# Patient Record
Sex: Female | Born: 1970 | Race: Black or African American | Hispanic: No | Marital: Married | Smoking: Former smoker
Health system: Southern US, Community
[De-identification: ages and names within clinical notes are randomized; demographics above are authoritative.]

## PROBLEM LIST (undated history)

## (undated) DIAGNOSIS — D649 Anemia, unspecified: Secondary | ICD-10-CM

## (undated) DIAGNOSIS — R51 Headache: Secondary | ICD-10-CM

## (undated) DIAGNOSIS — F419 Anxiety disorder, unspecified: Secondary | ICD-10-CM

## (undated) DIAGNOSIS — I509 Heart failure, unspecified: Secondary | ICD-10-CM

## (undated) DIAGNOSIS — M1990 Unspecified osteoarthritis, unspecified site: Secondary | ICD-10-CM

## (undated) DIAGNOSIS — F329 Major depressive disorder, single episode, unspecified: Secondary | ICD-10-CM

## (undated) DIAGNOSIS — F32A Depression, unspecified: Secondary | ICD-10-CM

## (undated) DIAGNOSIS — I10 Essential (primary) hypertension: Secondary | ICD-10-CM

## (undated) DIAGNOSIS — E119 Type 2 diabetes mellitus without complications: Secondary | ICD-10-CM

## (undated) DIAGNOSIS — Z87898 Personal history of other specified conditions: Secondary | ICD-10-CM

## (undated) HISTORY — PX: TONSILLECTOMY: SUR1361

## (undated) HISTORY — PX: INTRAUTERINE DEVICE INSERTION: SHX323

## (undated) HISTORY — DX: Type 2 diabetes mellitus without complications: E11.9

## (undated) HISTORY — PX: TUBAL LIGATION: SHX77

## (undated) HISTORY — DX: Essential (primary) hypertension: I10

## (undated) HISTORY — PX: SPINE SURGERY: SHX786

## (undated) HISTORY — PX: WISDOM TOOTH EXTRACTION: SHX21

---

## 1998-01-25 ENCOUNTER — Emergency Department (HOSPITAL_COMMUNITY): Admission: EM | Admit: 1998-01-25 | Discharge: 1998-01-25 | Payer: Self-pay | Admitting: Emergency Medicine

## 2000-04-30 ENCOUNTER — Emergency Department (HOSPITAL_COMMUNITY): Admission: EM | Admit: 2000-04-30 | Discharge: 2000-04-30 | Payer: Self-pay | Admitting: *Deleted

## 2003-12-05 ENCOUNTER — Emergency Department (HOSPITAL_COMMUNITY): Admission: EM | Admit: 2003-12-05 | Discharge: 2003-12-05 | Payer: Self-pay | Admitting: Family Medicine

## 2004-09-25 ENCOUNTER — Emergency Department (HOSPITAL_COMMUNITY): Admission: EM | Admit: 2004-09-25 | Discharge: 2004-09-25 | Payer: Self-pay | Admitting: Emergency Medicine

## 2005-03-12 ENCOUNTER — Emergency Department (HOSPITAL_COMMUNITY): Admission: EM | Admit: 2005-03-12 | Discharge: 2005-03-12 | Payer: Self-pay | Admitting: Family Medicine

## 2005-03-30 ENCOUNTER — Emergency Department (HOSPITAL_COMMUNITY): Admission: EM | Admit: 2005-03-30 | Discharge: 2005-03-30 | Payer: Self-pay | Admitting: Family Medicine

## 2006-01-22 ENCOUNTER — Emergency Department (HOSPITAL_COMMUNITY): Admission: EM | Admit: 2006-01-22 | Discharge: 2006-01-22 | Payer: Self-pay | Admitting: *Deleted

## 2007-08-08 ENCOUNTER — Emergency Department (HOSPITAL_COMMUNITY): Admission: EM | Admit: 2007-08-08 | Discharge: 2007-08-08 | Payer: Self-pay | Admitting: Emergency Medicine

## 2007-11-11 ENCOUNTER — Emergency Department (HOSPITAL_COMMUNITY): Admission: EM | Admit: 2007-11-11 | Discharge: 2007-11-11 | Payer: Self-pay | Admitting: Emergency Medicine

## 2008-03-24 ENCOUNTER — Emergency Department (HOSPITAL_COMMUNITY): Admission: EM | Admit: 2008-03-24 | Discharge: 2008-03-24 | Payer: Self-pay | Admitting: Emergency Medicine

## 2008-05-24 ENCOUNTER — Ambulatory Visit: Payer: Self-pay | Admitting: Family Medicine

## 2008-05-24 DIAGNOSIS — I10 Essential (primary) hypertension: Secondary | ICD-10-CM | POA: Insufficient documentation

## 2008-05-24 HISTORY — DX: Morbid (severe) obesity due to excess calories: E66.01

## 2008-05-24 LAB — CONVERTED CEMR LAB: TSH: 1.021 microintl units/mL (ref 0.350–4.50)

## 2008-05-25 ENCOUNTER — Ambulatory Visit: Payer: Self-pay | Admitting: *Deleted

## 2008-06-01 ENCOUNTER — Telehealth (INDEPENDENT_AMBULATORY_CARE_PROVIDER_SITE_OTHER): Payer: Self-pay | Admitting: *Deleted

## 2008-06-06 DIAGNOSIS — J45909 Unspecified asthma, uncomplicated: Secondary | ICD-10-CM | POA: Insufficient documentation

## 2008-08-19 ENCOUNTER — Telehealth (INDEPENDENT_AMBULATORY_CARE_PROVIDER_SITE_OTHER): Payer: Self-pay | Admitting: *Deleted

## 2008-08-24 ENCOUNTER — Ambulatory Visit: Payer: Self-pay | Admitting: Family Medicine

## 2008-08-24 DIAGNOSIS — G4700 Insomnia, unspecified: Secondary | ICD-10-CM | POA: Insufficient documentation

## 2008-08-24 DIAGNOSIS — J069 Acute upper respiratory infection, unspecified: Secondary | ICD-10-CM | POA: Insufficient documentation

## 2008-08-24 HISTORY — DX: Insomnia, unspecified: G47.00

## 2008-10-04 ENCOUNTER — Ambulatory Visit: Payer: Self-pay | Admitting: Family Medicine

## 2008-10-04 DIAGNOSIS — F329 Major depressive disorder, single episode, unspecified: Secondary | ICD-10-CM

## 2008-10-04 DIAGNOSIS — F32A Depression, unspecified: Secondary | ICD-10-CM | POA: Insufficient documentation

## 2008-10-10 ENCOUNTER — Ambulatory Visit: Payer: Self-pay | Admitting: Family Medicine

## 2008-10-12 ENCOUNTER — Telehealth (INDEPENDENT_AMBULATORY_CARE_PROVIDER_SITE_OTHER): Payer: Self-pay | Admitting: Family Medicine

## 2008-10-13 ENCOUNTER — Telehealth (INDEPENDENT_AMBULATORY_CARE_PROVIDER_SITE_OTHER): Payer: Self-pay | Admitting: Family Medicine

## 2008-10-24 ENCOUNTER — Telehealth (INDEPENDENT_AMBULATORY_CARE_PROVIDER_SITE_OTHER): Payer: Self-pay | Admitting: *Deleted

## 2008-10-26 ENCOUNTER — Telehealth (INDEPENDENT_AMBULATORY_CARE_PROVIDER_SITE_OTHER): Payer: Self-pay | Admitting: Family Medicine

## 2008-11-09 ENCOUNTER — Ambulatory Visit: Payer: Self-pay | Admitting: Family Medicine

## 2008-11-09 DIAGNOSIS — R51 Headache: Secondary | ICD-10-CM

## 2008-11-09 DIAGNOSIS — R519 Headache, unspecified: Secondary | ICD-10-CM | POA: Insufficient documentation

## 2008-11-11 ENCOUNTER — Telehealth (INDEPENDENT_AMBULATORY_CARE_PROVIDER_SITE_OTHER): Payer: Self-pay | Admitting: Family Medicine

## 2008-11-18 ENCOUNTER — Telehealth (INDEPENDENT_AMBULATORY_CARE_PROVIDER_SITE_OTHER): Payer: Self-pay | Admitting: Family Medicine

## 2008-12-21 ENCOUNTER — Ambulatory Visit: Payer: Self-pay | Admitting: Physician Assistant

## 2008-12-21 DIAGNOSIS — G5600 Carpal tunnel syndrome, unspecified upper limb: Secondary | ICD-10-CM | POA: Insufficient documentation

## 2008-12-30 ENCOUNTER — Emergency Department (HOSPITAL_COMMUNITY): Admission: EM | Admit: 2008-12-30 | Discharge: 2008-12-30 | Payer: Self-pay | Admitting: Emergency Medicine

## 2009-01-20 ENCOUNTER — Telehealth: Payer: Self-pay | Admitting: Physician Assistant

## 2009-01-20 ENCOUNTER — Ambulatory Visit: Payer: Self-pay | Admitting: Physician Assistant

## 2009-01-30 ENCOUNTER — Encounter: Payer: Self-pay | Admitting: Physician Assistant

## 2009-02-02 ENCOUNTER — Ambulatory Visit (HOSPITAL_COMMUNITY): Admission: RE | Admit: 2009-02-02 | Discharge: 2009-02-02 | Payer: Self-pay | Admitting: Physician Assistant

## 2009-02-02 ENCOUNTER — Encounter: Payer: Self-pay | Admitting: Physician Assistant

## 2009-02-03 ENCOUNTER — Ambulatory Visit: Payer: Self-pay | Admitting: Physician Assistant

## 2009-02-03 DIAGNOSIS — M6530 Trigger finger, unspecified finger: Secondary | ICD-10-CM | POA: Insufficient documentation

## 2009-02-24 ENCOUNTER — Ambulatory Visit: Payer: Self-pay | Admitting: Internal Medicine

## 2009-03-22 ENCOUNTER — Telehealth: Payer: Self-pay | Admitting: Physician Assistant

## 2009-03-27 ENCOUNTER — Ambulatory Visit: Payer: Self-pay | Admitting: Physician Assistant

## 2009-04-04 ENCOUNTER — Telehealth: Payer: Self-pay | Admitting: Physician Assistant

## 2009-04-28 ENCOUNTER — Encounter: Payer: Self-pay | Admitting: Physician Assistant

## 2009-05-11 ENCOUNTER — Ambulatory Visit: Payer: Self-pay | Admitting: Physician Assistant

## 2009-06-09 ENCOUNTER — Ambulatory Visit: Payer: Self-pay | Admitting: Physician Assistant

## 2009-06-12 ENCOUNTER — Encounter: Payer: Self-pay | Admitting: Physician Assistant

## 2009-06-22 ENCOUNTER — Ambulatory Visit: Payer: Self-pay | Admitting: Physician Assistant

## 2009-10-30 ENCOUNTER — Ambulatory Visit: Payer: Self-pay | Admitting: Physician Assistant

## 2009-10-30 DIAGNOSIS — IMO0002 Reserved for concepts with insufficient information to code with codable children: Secondary | ICD-10-CM | POA: Insufficient documentation

## 2009-11-01 ENCOUNTER — Ambulatory Visit (HOSPITAL_COMMUNITY): Admission: RE | Admit: 2009-11-01 | Discharge: 2009-11-01 | Payer: Self-pay | Admitting: Internal Medicine

## 2009-11-01 DIAGNOSIS — M5126 Other intervertebral disc displacement, lumbar region: Secondary | ICD-10-CM | POA: Insufficient documentation

## 2009-11-01 HISTORY — DX: Other intervertebral disc displacement, lumbar region: M51.26

## 2009-11-13 ENCOUNTER — Ambulatory Visit: Payer: Self-pay | Admitting: Physician Assistant

## 2009-11-14 ENCOUNTER — Encounter: Payer: Self-pay | Admitting: Physician Assistant

## 2009-11-14 ENCOUNTER — Telehealth: Payer: Self-pay | Admitting: Physician Assistant

## 2009-11-23 ENCOUNTER — Encounter: Admission: RE | Admit: 2009-11-23 | Discharge: 2009-11-23 | Payer: Self-pay | Admitting: Internal Medicine

## 2009-12-04 ENCOUNTER — Telehealth: Payer: Self-pay | Admitting: Physician Assistant

## 2009-12-07 ENCOUNTER — Encounter: Admission: RE | Admit: 2009-12-07 | Discharge: 2009-12-07 | Payer: Self-pay | Admitting: Internal Medicine

## 2009-12-22 ENCOUNTER — Encounter: Admission: RE | Admit: 2009-12-22 | Discharge: 2009-12-22 | Payer: Self-pay | Admitting: Internal Medicine

## 2010-01-18 ENCOUNTER — Telehealth: Payer: Self-pay | Admitting: Physician Assistant

## 2010-01-26 ENCOUNTER — Encounter (INDEPENDENT_AMBULATORY_CARE_PROVIDER_SITE_OTHER): Payer: Self-pay | Admitting: *Deleted

## 2010-02-13 ENCOUNTER — Ambulatory Visit: Payer: Self-pay | Admitting: Physician Assistant

## 2010-02-13 DIAGNOSIS — E785 Hyperlipidemia, unspecified: Secondary | ICD-10-CM | POA: Insufficient documentation

## 2010-02-13 HISTORY — DX: Hyperlipidemia, unspecified: E78.5

## 2010-02-27 ENCOUNTER — Encounter: Payer: Self-pay | Admitting: Physician Assistant

## 2010-03-09 ENCOUNTER — Encounter (INDEPENDENT_AMBULATORY_CARE_PROVIDER_SITE_OTHER): Payer: Self-pay | Admitting: *Deleted

## 2010-03-15 ENCOUNTER — Ambulatory Visit: Payer: Self-pay | Admitting: Physician Assistant

## 2010-03-15 LAB — CONVERTED CEMR LAB
ALT: 21 units/L (ref 0–35)
AST: 20 units/L (ref 0–37)
Chloride: 102 meq/L (ref 96–112)
Creatinine, Ser: 0.83 mg/dL (ref 0.40–1.20)
Total Bilirubin: 0.3 mg/dL (ref 0.3–1.2)

## 2010-03-16 ENCOUNTER — Encounter: Payer: Self-pay | Admitting: Physician Assistant

## 2010-03-24 HISTORY — PX: CHOLECYSTECTOMY: SHX55

## 2010-03-28 ENCOUNTER — Emergency Department (HOSPITAL_COMMUNITY): Admission: EM | Admit: 2010-03-28 | Discharge: 2010-03-29 | Payer: Self-pay | Admitting: Emergency Medicine

## 2010-04-04 ENCOUNTER — Ambulatory Visit (HOSPITAL_COMMUNITY): Admission: RE | Admit: 2010-04-04 | Discharge: 2010-04-04 | Payer: Self-pay | Admitting: Surgery

## 2010-04-04 HISTORY — PX: GALLBLADDER SURGERY: SHX652

## 2010-04-09 DIAGNOSIS — Z9089 Acquired absence of other organs: Secondary | ICD-10-CM

## 2010-04-25 ENCOUNTER — Ambulatory Visit: Payer: Self-pay | Admitting: Nurse Practitioner

## 2010-05-07 ENCOUNTER — Encounter (INDEPENDENT_AMBULATORY_CARE_PROVIDER_SITE_OTHER): Payer: Self-pay | Admitting: Nurse Practitioner

## 2010-06-05 ENCOUNTER — Encounter
Admission: RE | Admit: 2010-06-05 | Discharge: 2010-06-21 | Payer: Self-pay | Source: Home / Self Care | Attending: Neurological Surgery | Admitting: Neurological Surgery

## 2010-06-13 ENCOUNTER — Ambulatory Visit: Payer: Self-pay | Admitting: Nurse Practitioner

## 2010-06-13 ENCOUNTER — Telehealth (INDEPENDENT_AMBULATORY_CARE_PROVIDER_SITE_OTHER): Payer: Self-pay | Admitting: Nurse Practitioner

## 2010-06-27 ENCOUNTER — Encounter
Admission: RE | Admit: 2010-06-27 | Discharge: 2010-07-24 | Payer: Self-pay | Source: Home / Self Care | Attending: Neurological Surgery | Admitting: Neurological Surgery

## 2010-06-29 ENCOUNTER — Encounter (INDEPENDENT_AMBULATORY_CARE_PROVIDER_SITE_OTHER): Payer: Self-pay | Admitting: Nurse Practitioner

## 2010-07-02 ENCOUNTER — Encounter: Admit: 2010-07-02 | Payer: Self-pay | Admitting: Neurological Surgery

## 2010-07-11 ENCOUNTER — Encounter
Admission: RE | Admit: 2010-07-11 | Discharge: 2010-07-11 | Payer: Self-pay | Source: Home / Self Care | Attending: Neurological Surgery | Admitting: Neurological Surgery

## 2010-07-22 LAB — CONVERTED CEMR LAB
BUN: 11 mg/dL
Creatinine, Ser: 0.82 mg/dL
Potassium: 5.1 meq/L
Sed Rate: 8 mm/hr
Uric Acid, Serum: 4.6 mg/dL

## 2010-07-26 NOTE — Letter (Signed)
Summary: [facility]   Imported By: Arta Bruce 07/13/2010 15:50:56  _____________________________________________________________________  External Attachment:    Type:   Image     Comment:   External Document

## 2010-07-26 NOTE — Progress Notes (Signed)
Summary: Office Visit/DEPRESSION SCREENING  Office Visit/DEPRESSION SCREENING   Imported By: Arta Bruce 09/04/2009 16:57:04  _____________________________________________________________________  External Attachment:    Type:   Image     Comment:   External Document

## 2010-07-26 NOTE — Progress Notes (Signed)
Summary: Medication refills (pt missplaced previous refills)  Phone Note Call from Patient Call back at [phone]   Summary of Call: The pt accidently missplaced her two refills meeication (Diclofenax and percocet ) and she wondered if the provider can call in again to the pharmacy.  [facility] [address] [facility]ord Chessman PA-c  Initial call taken by: Manon Hilding,  December 04, 2009 4:13 PM  Follow-up for Phone Call        Sent to S. Lisel Siegrist.  Dutch Quint RN  December 04, 2009 4:26 PM   Additional Follow-up for Phone Call Additional follow up Details #1::        Cannot fill narcotics too soon. Cannot get percocet until 12/14/2009. Additional Follow-up by: Tereso Newcomer PA-C,  December 04, 2009 5:50 PM    Additional Follow-up for Phone Call Additional follow up Details #2::    the customer you are trying to reach phone is off or their unavailable...Marland KitchenMarland KitchenArmenia Shannon  December 05, 2009 8:16 AM  pt is aware.. [name]  December 05, 2009 2:43 PM   Prescriptions: DICLOFENAC SODIUM 75 MG TBEC (DICLOFENAC SODIUM) Take 1 tablet by mouth two times a day with food as needed for pain  #60 x 1   Entered and Authorized by:   Tereso Newcomer PA-C   Signed by:   Tereso Newcomer PA-C on 12/04/2009   Method used:   Electronically to        [facility] [address] (retail)       [phone]       [place], [place]  [address]       Ph: [phone]       Fax: [phone]   RxID:   [id number]

## 2010-07-26 NOTE — Assessment & Plan Note (Signed)
Summary: 3 MONTH FU FOR BP///KT   Vital Signs:  Patient profile:   40 year old female Height:      64 inches Weight:      320 pounds BMI:     55.13 Temp:     97.9 degrees F oral Pulse rate:   78 / minute Pulse rhythm:   regular Resp:     18 per minute BP sitting:   132 / 98  (left arm) Cuff size:   large  Vitals Entered By: CMA Student CC: 3 mth. follow- up BP, patient hasn't been taking medciation for BP, in over two mths., currently having pain in back and right leg, Hypertension Management Is Patient Diabetic? No Pain Assessment Patient in pain? yes     Location: lower back Intensity: 8 Type: heaviness/ burning  Does patient need assistance? Functional Status Self care Ambulation Normal   Primary Care Provider:  Tereso Newcomer, PA-C  CC:  3 mth. follow- up BP, patient hasn't been taking medciation for BP, in over two mths., currently having pain in back and right leg, and Hypertension Management.  History of Present Illness: Here for f/u. No meds for HTN for 2 months.  Never got refilled. Depression:  Still going to [facility].  DDD:  Had 3 ESI.  Last injection was in July.  She had some relief for a few weeks after her injection.  But, pain has worsened since last injection.  Still having lumbar spine pain.  Radiates down posterior leg and lateral leg with assoc burning on the right.  Taking percocet as needed for pain now.  Has run out of the diclofenac.  No loss of bowel or bladder function.  No saddle anesthesia.    Hypertension History:      She denies headache, chest pain, dyspnea with exertion, and syncope.  She notes no problems with any antihypertensive medication side effects.  Further comments include: gets lightheaded at times.        Positive major cardiovascular risk factors include hyperlipidemia, hypertension, and current tobacco user.  Negative major cardiovascular risk factors include female age less than 82 years old.     Current Medications  (verified): 1)  Hydrochlorothiazide 25 Mg  Tabs (Hydrochlorothiazide) .... Take 1 Tab By Mouth Every Morning 2)  Tessalon Perles 100 Mg  Caps (Benzonatate) .... Take 1 Capsule By Mouth Every 8 Hours As Needed Cough 3)  Celexa 40 Mg Tabs (Citalopram Hydrobromide) .... Take 1/2 Tablet By Mouth Once A Day (Dose Changed By Camila Li) 4)  Diclofenac Sodium 75 Mg Tbec (Diclofenac Sodium) .... Take 1 Tablet By Mouth Two Times A Day With Food As Needed For Pain 5)  Wellbutrin Xl 150 Mg Xr24h-Tab (Bupropion Hcl) .... Take 1 Tablet By Mouth Two Times A Day (Per [facility]) 6)  Flexeril 10 Mg Tabs (Cyclobenzaprine Hcl) .... Take 1 Tab By Mouth At Bedtime As Needed For Back Pain 7)  Percocet 5-325 Mg Tabs (Oxycodone-Acetaminophen) .Marland Kitchen.. 1-2 Tabs Every 8-12 Hours As Needed For Severe Pain  Allergies (verified): No Known Drug Allergies  Physical Exam  General:  alert and well-developed.   Head:  normocephalic and atraumatic.   Neck:  supple.   Lungs:  normal breath sounds, no crackles, and no wheezes.   Heart:  normal rate and regular rhythm.   Msk:  + SLR on right + lumbosacral pain with palp  Neurologic:  strength in bilat lower ext is equal and normal bilat patellar and achilles DTRs 2+ bilat  Psych:  normally interactive.     Impression & Recommendations:  Problem # 1:  ESSENTIAL HYPERTENSION (ICD-401.9) refill meds check BP and bmet in 2 weeks  Her updated medication list for this problem includes:    Hydrochlorothiazide 25 Mg Tabs (Hydrochlorothiazide) .Marland Kitchen... Take 1 tab by mouth every morning  Problem # 2:  DEPRESSION (ICD-311) still going to [facility]  Her updated medication list for this problem includes:    Celexa 40 Mg Tabs (Citalopram hydrobromide) .Marland Kitchen... Take 1/2 tablet by mouth once a day (dose changed by [facility])    Wellbutrin Xl 150 Mg Xr24h-tab (Bupropion hcl) .Marland Kitchen... Take 1 tablet by mouth two times a day (per [facility])  Problem # 3:  DEGENERATIVE DISC DISEASE, LUMBOSACRAL SPINE W/RADICULOPATHY  (ICD-722.10)  worsening pain has completed 3 ESI will send to neurosurgery now pain contract signed refill percocet now with 60 tabs per mo (only give one Rx today - 30 tabs Rx was discarded)  Orders: Neurosurgeon Referral (Neurosurgeon)  Complete Medication List: 1)  Hydrochlorothiazide 25 Mg Tabs (Hydrochlorothiazide) .... Take 1 tab by mouth every morning 2)  Tessalon Perles 100 Mg Caps (Benzonatate) .... Take 1 capsule by mouth every 8 hours as needed cough 3)  Celexa 40 Mg Tabs (Citalopram hydrobromide) .... Take 1/2 tablet by mouth once a day (dose changed by [facility]) 4)  Diclofenac Sodium 75 Mg Tbec (Diclofenac sodium) .... Take 1 tablet by mouth two times a day with food as needed for pain 5)  Wellbutrin Xl 150 Mg Xr24h-tab (Bupropion hcl) .... Take 1 tablet by mouth two times a day (per [facility]) 6)  Flexeril 10 Mg Tabs (Cyclobenzaprine hcl) .... Take 1 tab by mouth at bedtime as needed for back pain 7)  Percocet 5-325 Mg Tabs (Oxycodone-acetaminophen) .Marland Kitchen.. 1-2 tabs every 8-12 hours as needed for severe pain  Hypertension Assessment/Plan:      The patient's hypertensive risk group is category B: At least one risk factor (excluding diabetes) with no target organ damage.  Today's blood pressure is 132/98.  Her blood pressure goal is < 140/90.  Patient Instructions: 1)  Restart HCTZ for blood pressure.   2)  I faxed Rx to your pharmacy. 3)  Follow up with the nurse in 2 weeks for CMET and BP check.  Notify provider if BP > 140/90. 4)  Please schedule a follow-up appointment in 2 months with Scott for blood pressure and back.  5)  Schedule CPP in 5 months. Prescriptions: PERCOCET 5-325 MG TABS (OXYCODONE-ACETAMINOPHEN) 1-2 tabs every 8-12 hours as needed for severe pain  #60 x 0   Entered and Authorized by:   Tereso Newcomer PA-C   Signed by:   Tereso Newcomer PA-C on 02/13/2010   Method used:   Print then Give to Patient   RxID:   [id number] PERCOCET 5-325 MG TABS  (OXYCODONE-ACETAMINOPHEN) 1-2 tabs every 8-12 hours as needed for severe pain  #30 x 0   Entered and Authorized by:   Tereso Newcomer PA-C   Signed by:   Tereso Newcomer PA-C on 02/13/2010   Method used:   Print then Give to Patient   RxID:   [id number] FLEXERIL 10 MG TABS (CYCLOBENZAPRINE HCL) Take 1 tab by mouth at bedtime as needed for back pain  #30 x 0   Entered and Authorized by:   Tereso Newcomer PA-C   Signed by:   Tereso Newcomer PA-C on 02/13/2010   Method used:   Electronically to        [facility]  [address] (retail)       [phone]       [place], [place]  [address]       Ph: [phone]       Fax: [phone]   RxID:   [id number] DICLOFENAC SODIUM 75 MG TBEC (DICLOFENAC SODIUM) Take 1 tablet by mouth two times a day with food as needed for pain  #60 x 1   Entered and Authorized by:   Tereso Newcomer PA-C   Signed by:   Tereso Newcomer PA-C on 02/13/2010   Method used:   Electronically to        [facility] [address] (retail)       [phone]       [place], [place]  [address]       Ph: [phone]       Fax: [phone]   RxID:   [id number] HYDROCHLOROTHIAZIDE 25 MG  TABS (HYDROCHLOROTHIAZIDE) Take 1 tab by mouth every morning  #30 x 5   Entered and Authorized by:   Tereso Newcomer PA-C   Signed by:   Tereso Newcomer PA-C on 02/13/2010   Method used:   Electronically to        [facility] [address] (retail)       [phone]       [place], [place]  [address]       Ph: [phone]       Fax: [phone]   RxID:   [id number]

## 2010-07-26 NOTE — Letter (Signed)
Summary: REFERRAL/RADIOLOGY//APPT DATE/TIME  REFERRAL/RADIOLOGY//APPT DATE/TIME   Imported By: Arta Bruce 12/04/2009 10:18:01  _____________________________________________________________________  External Attachment:    Type:   Image     Comment:   External Document

## 2010-07-26 NOTE — Progress Notes (Signed)
Summary: Office Visit//DEPRESSION SCREENING  Office Visit//DEPRESSION SCREENING   Imported By: Arta Bruce 08/02/2009 10:08:39  _____________________________________________________________________  External Attachment:    Type:   Image     Comment:   External Document

## 2010-07-26 NOTE — Progress Notes (Signed)
Summary: BACK PAIN NO BETTER  Phone Note Call from Patient Call back at Home Phone [phone]   Summary of Call: Cynthia Becker PT. [facility] HAD AN APPT. TODAY, BUT HER TRANSPORTATON DID NOT SHOW UP, AND SHE WANTS TO KNOW IF YOU CAN  CALL SOMETHING IN FOR HER BACK PAIN. ITS GETTING WORSE, SHE IS TAKING 8 IBUPROFENS A DAY AND IT 'S NOT HELPING, AND THE PAIN IS BURNING BAD AND CAN'T HARDLY SLEEP, AND THE PAIN IS GOING ALL THE WAY DOWN TO HER ANKILE ON THE RIGHT SIDE.  SHE USES [facility] ON [address]. Initial call taken by: Leodis Rains,  January 18, 2010 2:32 PM  Follow-up for Phone Call        the customer you are trying to reach phone is off or their unavailable.Marland KitchenMarland KitchenMarland KitchenArmenia Shannon  January 19, 2010 10:21    pt says she wants a refill on perocet and flexril for her back pain....[facility] [address].... [name]  January 19, 2010 4:38 PM   Additional Follow-up for Phone Call Additional follow up Details #1::        Ok to fill percocet and flexeril. Entered under your name. Please call in to her pharmacy. Additional Follow-up by: Tereso Newcomer PA-C,  January 19, 2010 4:45 PM    Additional Follow-up for Phone Call Additional follow up Details #2::    pt is aware Follow-up by: [name],  January 19, 2010 4:46 PM  Prescriptions: FLEXERIL 10 MG TABS (CYCLOBENZAPRINE HCL) Take 1 tab by mouth at bedtime as needed for back pain  #30 x 0   Entered by:   [name]   Authorized by:   Tereso Newcomer PA-C   Signed by:   [name] on 01/19/2010   Method used:   Printed then faxed to ...       [facility] [address] (retail)       [phone]       [place], [place]  [address]       Ph: [phone]       Fax: [phone]   RxID:   [id number] PERCOCET 5-325 MG TABS (OXYCODONE-ACETAMINOPHEN) 1-2 tabs every 8-12 hours as needed for severe pain  #30 x 0   Entered by:   [name]   Authorized by:   Tereso Newcomer PA-C   Signed by:   [name] on  01/19/2010   Method used:   Printed then faxed to ...       [facility] [address] (retail)       [phone]       [place], [place]  [address]       Ph: [phone]       Fax: [phone]   RxID:   [id number]   Appended Document: BACK PAIN NO BETTER reprinted Rx's in your basket  Tereso Newcomer PA-C  January 22, 2010 5:47 PM'   the customer you are trying to reach phone is off or their unavailable.. [name]  January 25, 2010 4:57 PM  Left message on answer machine for pt. to return call. Gaylyn Cheers RN  January 26, 2010 8:53 AM   the customer you are trying to reach phone is off or their unavailable... will mail letter.Marland KitchenMarland KitchenArmenia Shannon  January 26, 2010 3:21 PM      Clinical Lists Changes  Medications: Rx of FLEXERIL 10 MG TABS (  CYCLOBENZAPRINE HCL) Take 1 tab by mouth at bedtime as needed for back pain;  #30 x 0;  Signed;  Entered by: Tereso Newcomer PA-C;  Authorized by: Tereso Newcomer PA-C;  Method used: Reprint Rx of PERCOCET 5-325 MG TABS (OXYCODONE-ACETAMINOPHEN) 1-2 tabs every 8-12 hours as needed for severe pain;  #30 x 0;  Signed;  Entered by: Tereso Newcomer PA-C;  Authorized by: Tereso Newcomer PA-C;  Method used: Reprint    Prescriptions: PERCOCET 5-325 MG TABS (OXYCODONE-ACETAMINOPHEN) 1-2 tabs every 8-12 hours as needed for severe pain  #30 x 0   Entered and Authorized by:   Tereso Newcomer PA-C   Signed by:   Tereso Newcomer PA-C on 01/22/2010   Method used:   Reprint   RxID:   [id number] FLEXERIL 10 MG TABS (CYCLOBENZAPRINE HCL) Take 1 tab by mouth at bedtime as needed for back pain  #30 x 0   Entered and Authorized by:   Tereso Newcomer PA-C   Signed by:   Tereso Newcomer PA-C on 01/22/2010   Method used:   Reprint   RxID:   [id number]

## 2010-07-26 NOTE — Assessment & Plan Note (Signed)
Summary: 2-3 WEEK FU////KT   Vital Signs:  Patient profile:   40 year old female Height:      64 inches Weight:      308 pounds BMI:     53.06 Temp:     97.8 degrees F oral Pulse rate:   82 / minute Pulse rhythm:   regular Resp:     18 per minute BP sitting:   134 / 89  (left arm) Cuff size:   large  Vitals Entered By: [name] (Nov 13, 2009 10:19 AM) CC: 2-3 week f/u... pt says the shot helped alittle.. pt says she finished the presidone.. pt wants MRI results Is Patient Diabetic? No Pain Assessment Patient in pain? no       Does patient need assistance? Functional Status Self care Ambulation Normal   Primary Care Provider:  Tereso Newcomer, PA-C  CC:  2-3 week f/u... pt says the shot helped alittle.. pt says she finished the presidone.. pt wants MRI results.  History of Present Illness: Here for f/u on back.  Has disc degeneration at L5-S1 with disc protrusion moreso on the right.  She has continued pain in right leg.  It is in distribution of L5-S1.  She thinks she has had about 40% improvement in pain.  She describes burning sensation.  Still having some numbness.  Flexeril helping her sleep through the night.  NO loss of bowel or bladder function.    Problems Prior to Update: 1)  Degenerative Disc Disease, Lumbosacral Spine W/radiculopathy  (ICD-722.10) 2)  Back Pain, Lumbar, With Radiculopathy  (ICD-724.4) 3)  Trigger Finger  (ICD-727.03) 4)  Carpal Tunnel Syndrome  (ICD-354.0) 5)  Headache  (ICD-784.0) 6)  Depression  (ICD-311) 7)  Insomnia  (ICD-780.52) 8)  Uri  (ICD-465.9) 9)  Essential Hypertension  (ICD-401.9) 10)  Asthma  (ICD-493.90) 11)  Obesity  (ICD-278.00)  Current Medications (verified): 1)  Hydrochlorothiazide 25 Mg  Tabs (Hydrochlorothiazide) .... Take 1 Tab By Mouth Every Morning 2)  Tessalon Perles 100 Mg  Caps (Benzonatate) .... Take 1 Capsule By Mouth Every 8 Hours As Needed Cough 3)  Trazodone Hcl 50 Mg Tabs (Trazodone Hcl) .Marland Kitchen.. 1 By  Mouth At Bedtime As Needed Insomnia 4)  Celexa 40 Mg Tabs (Citalopram Hydrobromide) .... Take 1/2 Tablet By Mouth Once A Day (Dose Changed By Camila Li) 5)  Naprosyn 500 Mg Tabs (Naproxen) .Marland Kitchen.. 1 By Mouth Two Times A Day 6)  Wellbutrin Xl 150 Mg Xr24h-Tab (Bupropion Hcl) .... Take 1 Tablet By Mouth Once A Day (Per [facility]) 7)  Prednisone 10 Mg Tabs (Prednisone) .... Take 6 Tabs On Tues, 5 Tabs On Wed, 4 Tabs On Thurs, 3 Tabs On Fri, 2 Tabs On Sat, and 1 Tab On Sun, Then Stop 8)  Flexeril 10 Mg Tabs (Cyclobenzaprine Hcl) .... Take 1 Tab By Mouth At Bedtime As Needed For Back Pain  Allergies (verified): No Known Drug Allergies  Physical Exam  General:  alert, well-developed, and well-nourished.   Head:  normocephalic and atraumatic.   Neck:  supple.   Lungs:  normal breath sounds.   Heart:  normal rate and regular rhythm.   Msk:  neg SLR bilat quad strength a little decreased on right; o/w strength is normal and equal bilat  Neurologic:  achilles DTRs 2+ bilat  Psych:  normally interactive.     Impression & Recommendations:  Problem # 1:  DEGENERATIVE DISC DISEASE, LUMBOSACRAL SPINE W/RADICULOPATHY (ICD-722.10) still having symptoms in L5-S1 region findings MRI coincide  with her symptoms  will check with [facility] to see if they can help her with ESI d/w patient and she is ok with proceeding change naproxen to diclofenac to see if this helps will give percocet to use as needed severe pain . . . discussed with her when she would take this  Problem # 2:  DEPRESSION (ICD-311) doing well and following with [facility]  The following medications were removed from the medication list:    Trazodone Hcl 50 Mg Tabs (Trazodone hcl) .Marland Kitchen... 1 by mouth at bedtime as needed insomnia Her updated medication list for this problem includes:    Celexa 40 Mg Tabs (Citalopram hydrobromide) .Marland Kitchen... Take 1/2 tablet by mouth once a day (dose changed by [facility])    Wellbutrin Xl 150 Mg Xr24h-tab (Bupropion hcl) .Marland Kitchen... Take 1  tablet by mouth two times a day (per [facility])  Complete Medication List: 1)  Hydrochlorothiazide 25 Mg Tabs (Hydrochlorothiazide) .... Take 1 tab by mouth every morning 2)  Tessalon Perles 100 Mg Caps (Benzonatate) .... Take 1 capsule by mouth every 8 hours as needed cough 3)  Celexa 40 Mg Tabs (Citalopram hydrobromide) .... Take 1/2 tablet by mouth once a day (dose changed by [facility]) 4)  Diclofenac Sodium 75 Mg Tbec (Diclofenac sodium) .... Take 1 tablet by mouth two times a day with food as needed for pain 5)  Wellbutrin Xl 150 Mg Xr24h-tab (Bupropion hcl) .... Take 1 tablet by mouth two times a day (per [facility]) 6)  Flexeril 10 Mg Tabs (Cyclobenzaprine hcl) .... Take 1 tab by mouth at bedtime as needed for back pain 7)  Percocet 5-325 Mg Tabs (Oxycodone-acetaminophen) .Marland Kitchen.. 1-2 tabs every 8-12 hours as needed for severe pain  Patient Instructions: 1)  I gave you a prescription for Diclofenac.  You can use this instead of Naproxen.  They are in the same class of medicines as Motrin, Ibuprofen, Aleve.  So, do not take any of these medicines together. 2)  You can take Tylenol 500 mg 1-2 tabs every 6 hours as needed for pain.  Do not take more than 4000 mg in one day. 3)  You should only take the Percocet if you are in severe pain (7 out of 10 pain or higher).  Try to avoid taking it unless you absolutely have to.  Do not take with Tylenol as Percocet has Tylenol in it. 4)  The radiologist will call me back.  Once he does, I will put the order in for you to see them for injections.  If there is a change in plans, I will call you. 5)  If you get a series of injections (usually 3), call me when they are finished.  At that point, I can set you up with physical therapy.  If the injections have not helped or your pain gets worse, follow up sooner. 6)  Please schedule a follow-up appointment in 3 months with Maxten Shuler for blood pressure.  Prescriptions: PERCOCET 5-325 MG TABS (OXYCODONE-ACETAMINOPHEN) 1-2 tabs every  8-12 hours as needed for severe pain  #30 x 0   Entered and Authorized by:   Tereso Newcomer PA-C   Signed by:   Tereso Newcomer PA-C on 11/13/2009   Method used:   Print then Give to Patient   RxID:   [id number] DICLOFENAC SODIUM 75 MG TBEC (DICLOFENAC SODIUM) Take 1 tablet by mouth two times a day with food as needed for pain  #60 x 1   Entered and Authorized by:  Tereso Newcomer PA-C   Signed by:   Tereso Newcomer PA-C on 11/13/2009   Method used:   Print then Give to Patient   RxID:   [id number]

## 2010-07-26 NOTE — Progress Notes (Signed)
Summary: Radiology referral  Phone Note Outgoing Call   Summary of Call: Please refer to [facility] for epidural spinal injections. Ph [phone]. Initial call taken by: Brynda Rim,  Nov 14, 2009 4:33 PM

## 2010-07-26 NOTE — Letter (Signed)
Summary: CONTRACT WITH PROVIDER  CONTRACT WITH PROVIDER   Imported By: Arta Bruce 07/25/2009 14:33:53  _____________________________________________________________________  External Attachment:    Type:   Image     Comment:   External Document

## 2010-07-26 NOTE — Letter (Signed)
Summary: *HSN Results Follow up  [facility]  [address] [place], [place] [address]   Phone: [phone]  Fax: [phone]      01/26/2010   [name] [address] [place], [place]  [address]   Dear  [name],                            ____S.Drinkard,FNP   ____D. Gore,FNP       ____B. McPherson,MD   ____V. Rankins,MD    ____E. Mulberry,MD    ____N. Daphine Deutscher, FNP  ____D. Reche Dixon, MD    ____K. Philipp Deputy, MD    ____Other     This letter is to inform you that your recent test(s):  _______Pap Smear    _______Lab Test     _______X-ray    _______ is within acceptable limits  _______ requires a medication change  _______ requires a follow-up lab visit  _______ requires a follow-up visit with your provider   Comments:  We have been trying to reach you.  Please give the office a call at your earliest convenience.       _________________________________________________________ If you have any questions, please contact our office                     Sincerely,  [name] [facility]

## 2010-07-26 NOTE — Letter (Signed)
Summary: [facility]  [facility]   Imported By: Arta Bruce 06/05/2010 10:20:57  _____________________________________________________________________  External Attachment:    Type:   Image     Comment:   External Document

## 2010-07-26 NOTE — Progress Notes (Signed)
Summary: meds refill  Phone Note Refill Request   Refills Requested: Medication #1:  PERCOCET 5-325 MG TABS 1-2 tabs every 8-12 hours as needed for severe pain.  Medication #2:  FLEXERIL 10 MG TABS Take 1 tab by mouth at bedtime as needed for back pain [email or web address] phone [phone]   Initial call taken by: Domenic Polite,  June 13, 2010 11:44 AM    New/Updated Medications: PERCOCET 5-325 MG TABS (OXYCODONE-ACETAMINOPHEN) One tablet by mouth two times a day as needed for pain Prescriptions: FLEXERIL 10 MG TABS (CYCLOBENZAPRINE HCL) Take 1 tab by mouth at bedtime as needed for back pain  #30 x 0   Entered by:   Levon Hedger   Authorized by:   [name] FNP   Signed by:   Levon Hedger on 06/13/2010   Method used:   Print then Give to Patient   RxID:   [id number] PERCOCET 5-325 MG TABS (OXYCODONE-ACETAMINOPHEN) One tablet by mouth two times a day as needed for pain  #50 x 0   Entered by:   Levon Hedger   Authorized by:   [name] FNP   Signed by:   Levon Hedger on 06/13/2010   Method used:   Print then Give to Patient   RxID:   [id number]   Patient Instructions: 1)  You have been seen by the specialist for problems with your back. 2)  It was recommended that you not continue on narcotics for this pain. You have been given percocet in the past but again this is not recommended due to chronic use and your pain and results of studies does not indicate this method of treament. 3)  This office will taper down your dose of pain medications over the next 6 months.  You will get 50 tablets this month. 4)  You will however have to work on lifestyle modification especially related to weight loss as this will prove most beneficial for your back.

## 2010-07-26 NOTE — Letter (Signed)
Summary: AGREEMENT FOR CONTROLLED SUBSTANCE  AGREEMENT FOR CONTROLLED SUBSTANCE   Imported By: Arta Bruce 02/13/2010 15:26:31  _____________________________________________________________________  External Attachment:    Type:   Image     Comment:   External Document

## 2010-07-26 NOTE — Assessment & Plan Note (Signed)
Summary: HTN/Back Pain   Vital Signs:  Patient profile:   40 year old female LMP:     03/2010 Weight:      317.4 pounds BMI:     54.68 Temp:     97.1 degrees F oral Pulse rate:   76 / minute Pulse rhythm:   regular Resp:     20 per minute BP sitting:   100 / 80  (left arm) Cuff size:   regular  Vitals Entered By: Levon Hedger (April 25, 2010 12:39 PM)  Nutrition Counseling: Patient's BMI is greater than 25 and therefore counseled on weight management options. CC: follow-up visit back pain with hands swelling and numbing sensation., Hypertension Management, Lipid Management, Back Pain Is Patient Diabetic? No Pain Assessment Patient in pain? yes     Location: back  Does patient need assistance? Functional Status Self care Ambulation Normal LMP (date): 03/2010     Enter LMP: 03/2010   Primary Care Provider:  Tereso Newcomer, PA-C  CC:  follow-up visit back pain with hands swelling and numbing sensation., Hypertension Management, Lipid Management, and Back Pain.  History of Present Illness:  Pt into the office for blood pressure and back pain.  Back pain - pt did get MRI and had spinal injections which did not relieve symptoms. She was supposed to go to 04/04/2010 to see the specialist but she had to get her gallbladder removed so she did have to reschedule.  Her appt was 05/07/2010.  Obesity - weight has decreased 3 pounds since her last visit. She notices that this is the highest weight she has every been. Her weight is about 215 and she has lost weight before by changing her diet and increasing weight.  Social - pt was previously employed at [employer] in housekeeping.  She has now been unemployed and is not been as active as used to be  Back Pain History:      The patient's back pain has been present for > 6 weeks.  She states this is not work related.  She states that she has had a prior history of back pain.  The patient has not had any recent physical  therapy for her back pain.  The following makes the back pain worse: standing for 15 mintues makes the pain worse.    Critical Exclusionary Diagnosis Criteria (CEDC) for Back Pain:      There are no symptoms to suggest infection, cancer, cauda equina, or psychosocial factors for back pain.  Other positive CEDC factors include low back pain worse with activity.    Hypertension History:      She denies headache, chest pain, and palpitations.  She notes no problems with any antihypertensive medication side effects.  Pt states that she is taking her blood pressure medications however she did not bring meds into the office today.        Positive major cardiovascular risk factors include hyperlipidemia, hypertension, and current tobacco user.  Negative major cardiovascular risk factors include female age less than 27 years old and no history of diabetes.        Further assessment for target organ damage reveals no history of ASHD or stroke/TIA.    Lipid Management History:      Positive NCEP/ATP III risk factors include current tobacco user and hypertension.  Negative NCEP/ATP III risk factors include female age less than 58 years old, no history of early menopause without estrogen hormone replacement, non-diabetic, no ASHD (atherosclerotic heart disease), and no  prior stroke/TIA.      Allergies: No Known Drug Allergies  Past History:  Past Surgical History:  Cholecystectomy 04/04/2010  Review of Systems General:  Denies fever. CV:  Denies chest pain or discomfort. Resp:  Denies cough. GI:  Denies abdominal pain, nausea, and vomiting. MS:  Complains of low back pain.  Physical Exam  General:  alert.  obese Head:  normocephalic.   Lungs:  normal breath sounds.   Heart:  normal rate and regular rhythm.   Msk:  normal ROM.   Neurologic:  alert & oriented X3.   Psych:  Oriented X3.     Impression & Recommendations:  Problem # 1:  ESSENTIAL HYPERTENSION (ICD-401.9) BP is doing  well. Advsied pt to continue current medications Her updated medication list for this problem includes:    Hydrochlorothiazide 25 Mg Tabs (Hydrochlorothiazide) .Marland Kitchen... 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 Take 1 tablet by mouth two times a day with food as needed for pain    Flexeril 10 Mg Tabs (Cyclobenzaprine hcl) .Marland Kitchen... Take 1 tab by mouth at bedtime as needed for back pain    Percocet 5-325 Mg Tabs (Oxycodone-acetaminophen) .Marland Kitchen... 1-2 tabs every 8-12 hours as needed for severe pain  Problem # 4:  NEED PROPHYLACTIC VACCINATION&INOCULATION FLU (ICD-V04.81) givne today in office  Complete Medication List: 1)  Hydrochlorothiazide 25 Mg Tabs (Hydrochlorothiazide) .... Take 1 tab by mouth every morning 2)  Diclofenac Sodium 75 Mg Tbec (Diclofenac sodium) .... Take 1 tablet by mouth two times a day with food as needed for pain 3)  Wellbutrin Xl 150 Mg Xr24h-tab (Bupropion hcl) .... Take 1 tablet by mouth two times a day (per [facility]) 4)  Flexeril 10 Mg Tabs (Cyclobenzaprine hcl) .... Take 1 tab by mouth at bedtime as needed for back pain 5)  Percocet 5-325 Mg Tabs (Oxycodone-acetaminophen) .Marland Kitchen.. 1-2 tabs every 8-12 hours as needed for severe pain  Other Orders: Flu Vaccine 41yrs + (40981) Admin 1st Vaccine (19147)  Hypertension Assessment/Plan:      The patient's hypertensive risk group is category B: At least one risk factor (excluding diabetes) with no target organ damage.  Today's blood pressure is 100/80.  Her blood pressure goal is < 140/90.  Lipid Assessment/Plan:      Based on NCEP/ATP III, the patient's risk factor category is "0-1 risk factors".  The patient's lipid goals are  as follows: Total cholesterol goal is 200; LDL cholesterol goal is 130; HDL cholesterol goal is 40; Triglyceride goal is 150.     Patient Instructions: 1)  Blood pressure is doing well today. 2)  Be sure to take your medication as ordered. 3)  You have been given flu vaccine today. 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  5)  If it is stuffing envelopes, or office help or even hands on with patients to help feed, monitor them.  No lifting. 6)  Schedule an appointment for a complete physical exam. 7)  No food after midnight before this visit. Prescriptions: DICLOFENAC SODIUM 75 MG TBEC (DICLOFENAC SODIUM) Take 1 tablet  by mouth two times a day with food as needed for pain  #60 x 1   Entered and Authorized by:   [name] FNP   Signed by:   [name] FNP on 04/25/2010   Method used:   Print then Give to Patient   RxID:   [id number] FLEXERIL 10 MG TABS (CYCLOBENZAPRINE HCL) Take 1 tab by mouth at bedtime as needed for back pain  #30 x 0   Entered and Authorized by:   [name] FNP   Signed by:   [name] FNP on 04/25/2010   Method used:   Print then Give to Patient   RxID:   [id number] PERCOCET 5-325 MG TABS (OXYCODONE-ACETAMINOPHEN) 1-2 tabs every 8-12 hours as needed for severe pain  #60 x 0   Entered and Authorized by:   [name] FNP   Signed by:   [name] FNP on 04/25/2010   Method used:   Print then Give to Patient   RxID:   [id number]    Orders Added: 1)  Est. Patient Level III [59563] 2)  Flu Vaccine 90yrs + [87564] 3)  Admin 1st Vaccine [33295]   Immunizations Administered:  Influenza Vaccine # 1:    Vaccine Type: Fluvax 3+    Site: right deltoid    Mfr: GlaxoSmithKline    Dose: 0.5 ml    Route: IM    Given by: [name]    Exp. Date: 12/22/2010    Lot #: [id number]    VIS given: 01/16/10 version given April 25, 2010.  Flu Vaccine  Consent Questions:    Do you have a history of severe allergic reactions to this vaccine? no    Any prior history of allergic reactions to egg and/or gelatin? no    Do you have a sensitivity to the preservative Thimersol? no    Do you have a past history of Guillan-Barre Syndrome? no    Do you currently have an acute febrile illness? no    Have you ever had a severe reaction to latex? no    Vaccine information given and explained to patient? yes    Are you currently pregnant? no   Immunizations Administered:  Influenza Vaccine # 1:    Vaccine Type: Fluvax 3+    Site: right deltoid    Mfr: GlaxoSmithKline    Dose: 0.5 ml    Route: IM    Given by: [name]    Exp. Date: 12/22/2010    Lot #: [id number]    VIS given: 01/16/10 version given April 25, 2010.   Prevention & Chronic Care Immunizations   Influenza vaccine: Fluvax 3+  (04/25/2010)    Tetanus booster: Not documented    Pneumococcal vaccine: Not documented  Other Screening   Pap smear: Not documented    Mammogram: Not documented   Smoking status: current  (05/24/2008)  Lipids   Total Cholesterol: Not documented   LDL: Not documented   LDL Direct: Not documented   HDL: Not documented   Triglycerides: Not documented    SGOT (AST): 20  (03/15/2010)   SGPT (ALT): 21  (03/15/2010)   Alkaline phosphatase: 52  (03/15/2010)   Total bilirubin: 0.3  (03/15/2010)  Hypertension   Last Blood Pressure: 100 / 80  (04/25/2010)   Serum creatinine: 0.83  (03/15/2010)   Serum potassium 4.7  (03/15/2010)  Self-Management Support :    Hypertension self-management support: Not documented    Lipid self-management support: Not documented  Nursing Instructions: Give Flu vaccine today

## 2010-07-26 NOTE — Letter (Signed)
Summary: *HSN Results Follow up  [facility]  [address] [place], [place] [address]   Phone: [phone]  Fax: [phone]      03/09/2010   Sherrica Jeffus [address] [place], [place]  [address]   Dear  Ms. Teea Lanphear,                            ____S.Drinkard,FNP   ____D. Gore,FNP       ____B. McPherson,MD   ____V. Rankins,MD    ____E. Mulberry,MD    ____N. Daphine Deutscher, FNP  ____D. Reche Dixon, MD    ____K. Philipp Deputy, MD    ____Other     This letter is to inform you that your recent test(s):  _______Pap Smear    _______Lab Test     _______X-ray    _______ is within acceptable limits  _______ requires a medication change  _______ requires a follow-up lab visit  _______ requires a follow-up visit with your provider   Comments:  We have been trying to reach you concerning your medication.  Please give [place] a call at your earliest convenience.       _________________________________________________________ If you have any questions, please contact our office                     Sincerely,  [name] [facility]

## 2010-07-26 NOTE — Assessment & Plan Note (Signed)
Summary: right leg pain//gk   Vital Signs:  Patient profile:   40 year old female Height:      64 inches Weight:      306 pounds BMI:     52.71 Temp:     97.9 degrees F oral Pulse rate:   90 / minute Pulse rhythm:   regular Resp:     18 per minute BP sitting:   114 / 77  (left arm) Cuff size:   large  Vitals Entered By: [name] (Oct 30, 2009 10:26 AM) CC: pt is here for right leg pain.... pt says she has been taking ibuprofen... pt says she has been in pain for three months now... pt says her leg will be burning by the calf and goes up towards her butt.. pt says she is unable to sleep at night because of the pain... pt is unable to walk or sit to long..  Is Patient Diabetic? No Pain Assessment Patient in pain? no       Does patient need assistance? Functional Status Self care Ambulation Normal   Primary Care Provider:  Tereso Newcomer, PA-C  CC:  pt is here for right leg pain.... pt says she has been taking ibuprofen... pt says she has been in pain for three months now... pt says her leg will be burning by the calf and goes up towards her butt.. pt says she is unable to sleep at night because of the pain... pt is unable to walk or sit to long.. .  History of Present Illness: Here for leg pain.  Right leg pain:  Started in Feb.  Feels in post leg.  Describes pain in L5-S1 area.  Feels like it is burning.  Also feels numbness.  She notes some numbness on the left as well.  No pain in left leg.  She notes worsening pain with small amounts of walking or standing for a long time.  She is taking 8-10 ibuprofen per day.  Ibuprofen helps the pain.  She notes some back pain that started about the same time her leg pain started.  Her right leg is not weak.  No loss of bowel or bladder control.  Reports hurting her back many years ago.  No recent injuries.  Depression:  Went to [facility].  Meds changes.  Feels better.  Current Medications (verified): 1)  Hydrochlorothiazide 25 Mg  Tabs  (Hydrochlorothiazide) .... Take 1 Tab By Mouth Every Morning 2)  Tessalon Perles 100 Mg  Caps (Benzonatate) .... Take 1 Capsule By Mouth Every 8 Hours As Needed Cough 3)  Ibuprofen 600 Mg  Tabs (Ibuprofen) .... Take 1 Tablet Every 6-8 Hours As Needed For Throat Pain 4)  Trazodone Hcl 50 Mg Tabs (Trazodone Hcl) .Marland Kitchen.. 1 By Mouth At Bedtime As Needed Insomnia 5)  Celexa 40 Mg Tabs (Citalopram Hydrobromide) .... Take 1 Tablet By Mouth Once A Day (Pharmacy Note Dose Increase) 6)  Naprosyn 500 Mg Tabs (Naproxen) .Marland Kitchen.. 1 By Mouth Two Times A Day 7)  Cefuroxime Axetil 250 Mg Tabs (Cefuroxime Axetil) .... One Tab By Mouth Two Times A Day 8)  Prednisone (Pak) 10 Mg Tabs (Prednisone) .... Complete Pack As Directed 9)  Prednisone 10 Mg Tabs (Prednisone) .... Take 2 Tablets Daily For 10 Days, Then Take 1 Tablet Daily For 4 Days, Then Stop.  Allergies (verified): No Known Drug Allergies  Physical Exam  General:  alert, well-developed, and well-nourished.   Head:  normocephalic and atraumatic.   Neck:  supple.   Msk:  no spinal tend with palp neg SLR bilat  Neurologic:  patellar DTRs difficult to elicit achilles DTRs equal bilat BLE strength equal bilat with mild weakness of right hip flexors Skin:  turgor normal.   Psych:  normally interactive and good eye contact.     Impression & Recommendations:  Problem # 1:  DEPRESSION (ICD-311) doing well  going to [facility] now  Her updated medication list for this problem includes:    Trazodone Hcl 50 Mg Tabs (Trazodone hcl) .Marland Kitchen... 1 by mouth at bedtime as needed insomnia    Celexa 40 Mg Tabs (Citalopram hydrobromide) .Marland Kitchen... Take 1/2 tablet by mouth once a day (dose changed by [facility])    Wellbutrin Xl 150 Mg Xr24h-tab (Bupropion hcl) .Marland Kitchen... 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 Take 1 tablet every 6-8 hours as needed for throat pain Her updated medication list for this problem includes:    Naprosyn 500 Mg Tabs (Naproxen) .Marland Kitchen... 1 by mouth two times a day    Flexeril 10 Mg Tabs (Cyclobenzaprine hcl) .Marland Kitchen... Take 1 tab by mouth at bedtime as needed for back pain  Orders: Depo-Medrol 20mg  (J1020) MRI (MRI) Admin of Therapeutic Inj  intramuscular or subcutaneous ([address]) Depo- Medrol 80mg  (J1040)  Complete Medication List: 1)  Hydrochlorothiazide 25 Mg Tabs (Hydrochlorothiazide) .... Take 1 tab by mouth every morning 2)  Tessalon Perles 100 Mg Caps (Benzonatate) .... Take 1 capsule by mouth every 8 hours as needed cough 3)  Trazodone Hcl 50 Mg Tabs (Trazodone hcl) .Marland Kitchen.. 1 by mouth at bedtime as needed insomnia 4)  Celexa 40 Mg Tabs (Citalopram hydrobromide) .... Take 1/2 tablet by mouth once a day (dose changed by [facility]) 5)  Naprosyn 500 Mg Tabs (Naproxen) .Marland Kitchen.. 1 by mouth two times a day 6)  Wellbutrin Xl 150 Mg Xr24h-tab (Bupropion hcl) .... Take 1 tablet by mouth once a day (per [facility]) 7)  Prednisone 10 Mg Tabs (Prednisone) .... Take 6 tabs on tues, 5 tabs on wed, 4 tabs on thurs, 3 tabs on fri, 2 tabs on sat, and 1 tab on sun, then stop 8)  Flexeril 10 Mg Tabs (Cyclobenzaprine hcl) .... Take 1 tab by mouth at bedtime as needed for back pain  Patient Instructions: 1)  Depo-Medrol 80 mg IM x 1 in office today. 2)  Start taking the prednisone tomorrow. 3)  Take the prednisone until it is all gone. 4)  Start taking Naprosyn 500 mg two times a day with food when you have 2-3 days left on the prednisone.  Take for about 3-4 days after finishing the prednisone.  Then take two times a day with food as needed.  Do not take Naprosyn with Ibuprofen, Motrin, Advil, etc.  They are the same medicines. 5)  Take flexeril at bedtime  as needed for back pain.  You should not take with the sleeping aid they gave you at the [facility]. 6)  Please schedule a follow-up appointment in 2-3 weeks with Selisa Tensley for back pain. 7)    Prescriptions: FLEXERIL 10 MG TABS (CYCLOBENZAPRINE HCL) Take 1 tab by mouth at bedtime as needed for back  pain  #30 x 0   Entered and Authorized by:   Tereso Newcomer PA-C   Signed by:   Tereso Newcomer PA-C on 10/30/2009   Method used:   Print then Give to Patient   RxID:   [id number] NAPROSYN 500 MG TABS (NAPROXEN) 1 by mouth two times a day  #30 x 1   Entered and Authorized by:   Tereso Newcomer PA-C   Signed by:   Tereso Newcomer PA-C on 10/30/2009   Method used:   Print then Give to Patient   RxID:   [id number] PREDNISONE 10 MG TABS (PREDNISONE) Take 6 tabs on Tues, 5 tabs on Wed, 4 tabs on Thurs, 3 tabs on Fri, 2 tabs on Sat, and 1 tab on Sun, then stop  #21 x 0   Entered and Authorized by:   Tereso Newcomer PA-C   Signed by:   Tereso Newcomer PA-C on 10/30/2009   Method used:   Print then Give to Patient   RxID:   [id number]      Medication Administration  Injection # 1:    Medication: Depo- Medrol 80mg     Diagnosis: BACK PAIN, LUMBAR, 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 Patient Level III [01027] 4)  Admin of Therapeutic Inj  intramuscular or subcutaneous [96372] 5)  Depo- Medrol 80mg  [J1040]

## 2010-07-26 NOTE — Letter (Signed)
Summary: *HSN Results Follow up  [facility]  [address] [place], [place] [address]   Phone: [phone]  Fax: [phone]      03/16/2010   Cynthia Becker [address] [place], [place]  [address]   Dear  Ms. Andreia Macfarlane,                            ____S.Drinkard,FNP   ____D. Gore,FNP       ____B. McPherson,MD   ____V. Rankins,MD    ____E. Mulberry,MD    ____N. Daphine Deutscher, FNP  ____D. Reche Dixon, MD    ____K. Philipp Deputy, MD    __x__S. Alben Spittle, PA-C     This letter is to inform you that your recent test(s):  _______Pap Smear    ___x____Lab Test     _______X-ray    ___x____ is within acceptable limits  _______ requires a medication change  _______ requires a follow-up lab visit  _______ requires a follow-up visit with your provider   Comments: Liver and kidney function are normal.       _________________________________________________________ If you have any questions, please contact our office                     Sincerely,  Tereso Newcomer PA-C [facility]

## 2010-07-26 NOTE — Letter (Signed)
Summary: FMLA FORM//TO PICK UP  FMLA FORM//TO PICK UP   Imported By: Arta Bruce 02/27/2010 10:57:13  _____________________________________________________________________  External Attachment:    Type:   Image     Comment:   External Document

## 2010-08-02 ENCOUNTER — Other Ambulatory Visit (HOSPITAL_COMMUNITY): Payer: Self-pay | Admitting: Internal Medicine

## 2010-08-02 ENCOUNTER — Other Ambulatory Visit: Payer: Self-pay | Admitting: Nurse Practitioner

## 2010-08-02 ENCOUNTER — Other Ambulatory Visit (HOSPITAL_COMMUNITY)
Admission: RE | Admit: 2010-08-02 | Discharge: 2010-08-02 | Disposition: A | Discharge status: Home / Self Care | Payer: No Typology Code available for payment source | Source: Ambulatory Visit | Attending: Nurse Practitioner | Admitting: Nurse Practitioner

## 2010-08-02 ENCOUNTER — Encounter: Payer: Self-pay | Admitting: Nurse Practitioner

## 2010-08-02 ENCOUNTER — Encounter (INDEPENDENT_AMBULATORY_CARE_PROVIDER_SITE_OTHER): Payer: Self-pay | Admitting: Nurse Practitioner

## 2010-08-02 DIAGNOSIS — Z01419 Encounter for gynecological examination (general) (routine) without abnormal findings: Secondary | ICD-10-CM | POA: Insufficient documentation

## 2010-08-02 DIAGNOSIS — Z1231 Encounter for screening mammogram for malignant neoplasm of breast: Secondary | ICD-10-CM

## 2010-08-02 LAB — CONVERTED CEMR LAB
Bilirubin Urine: NEGATIVE
Blood in Urine, dipstick: NEGATIVE
Glucose, Urine, Semiquant: NEGATIVE
KOH Prep: NEGATIVE
Ketones, urine, test strip: NEGATIVE
pH: 6.5

## 2010-08-03 ENCOUNTER — Encounter (INDEPENDENT_AMBULATORY_CARE_PROVIDER_SITE_OTHER): Payer: Self-pay | Admitting: Nurse Practitioner

## 2010-08-03 LAB — CONVERTED CEMR LAB
Iron: 14 ug/dL — ABNORMAL LOW (ref 42–145)
Vitamin B-12: 438 pg/mL (ref 211–911)

## 2010-08-06 DIAGNOSIS — D509 Iron deficiency anemia, unspecified: Secondary | ICD-10-CM | POA: Insufficient documentation

## 2010-08-06 LAB — CONVERTED CEMR LAB
Eosinophils Absolute: 0.8 10*3/uL — ABNORMAL HIGH (ref 0.0–0.7)
GC Probe Amp, Genital: NEGATIVE
HIV: NONREACTIVE
Lymphocytes Relative: 36 % (ref 12–46)
Lymphs Abs: 3.2 10*3/uL (ref 0.7–4.0)
MCV: 54.6 fL — ABNORMAL LOW (ref 78.0–100.0)
Neutrophils Relative %: 47 % (ref 43–77)
Platelets: 439 10*3/uL — ABNORMAL HIGH (ref 150–400)
TSH: 0.883 microintl units/mL (ref 0.350–4.500)
WBC: 8.8 10*3/uL (ref 4.0–10.5)

## 2010-08-08 ENCOUNTER — Encounter (INDEPENDENT_AMBULATORY_CARE_PROVIDER_SITE_OTHER): Payer: Self-pay | Admitting: Nurse Practitioner

## 2010-08-09 NOTE — Progress Notes (Signed)
Summary: Office Visit//DEPRESSION SCREENING  Office Visit//DEPRESSION SCREENING   Imported By: Arta Bruce 08/03/2010 08:47:07  _____________________________________________________________________  External Attachment:    Type:   Image     Comment:   External Document

## 2010-08-09 NOTE — Assessment & Plan Note (Signed)
Summary: Complete Physical Exam   Vital Signs:  Patient profile:   40 year old female Menstrual status:  regular LMP:     07/12/2010 Weight:      313.9 pounds Temp:     98.0 degrees F oral Pulse rate:   80 / minute Pulse rhythm:   regular Resp:     20 per minute BP sitting:   106 / 80  (left arm) Cuff size:   regular  Vitals Entered By: Levon Hedger (August 02, 2010 2:49 PM) CC: CPP...cold with ears feeling clogged, head and chest congestion x 1 month...still having numbness in her arms, Lipid Management, Hypertension Management, Back Pain, Depression Is Patient Diabetic? No Pain Assessment Patient in pain? no       Does patient need assistance? Functional Status Self care Ambulation Normal  Vision Screening:Left eye w/o correction: 20 / 200 Right Eye w/o correction: 20 / 25-1 Both eyes w/o correction:  20/ 25-1        Vision Entered By: Levon Hedger (August 02, 2010 2:55 PM) LMP (date): 07/12/2010 LMP - Character: light     Menstrual Status regular Enter LMP: 07/12/2010   Primary Care Provider:  Tereso Newcomer, PA-C  CC:  CPP...cold with ears feeling clogged, head and chest congestion x 1 month...still having numbness in her arms, Lipid Management, Hypertension Management, Back Pain, and Depression.  History of Present Illness: Pt into the office for a complete physical exam  PAP - last pap smear ? 1995  Mammogram - No previous mammogram.  No family history of breast cancer no self breast exams at home  Optho - no recent eye exam but pt did have a problem with her vision during childhood. She did not follow up with the corrective lens over the years and agrees that her vision has worsened  Dental - No dental exam in over 5 years.  No current problematic teeth  Obesity - down 4 pounds since the last visit.   Pt is doing physical therapy and has increased her exercise.  Back Pain History:      The patient's back pain has been present for > 6  weeks.  She states that she has had a prior history of back pain.  The patient has had a recent course of physical therapy.    Critical Exclusionary Diagnosis Criteria (CEDC) for Back Pain:      The patient denies a history of previous trauma.  She has no prior history of spinal surgery.  There are no symptoms to suggest infection, cancer, cauda equina, or psychosocial factors for back pain.    Depression History:      The patient denies recurrent thoughts of death or suicide.        Psychosocial stress factors include major life changes.  The patient denies that she feels like life is not worth living, denies that she wishes that she were dead, and denies that she has thought about ending her life.        Comments:  "I miss my me time" Lots of stressors at home pt has been to the [facility].  Hypertension History:      She denies headache, chest pain, and palpitations.  She notes no problems with any antihypertensive medication side effects.  pt is taking meds as ordered.        Positive major cardiovascular risk factors include hyperlipidemia, hypertension, and current tobacco user.  Negative major cardiovascular risk factors include female age less than  15 years old and no history of diabetes.        Further assessment for target organ damage reveals no history of ASHD, stroke/TIA, or peripheral vascular disease.    Lipid Management History:      Positive NCEP/ATP III risk factors include current tobacco user and hypertension.  Negative NCEP/ATP III risk factors include female age less than 69 years old, no history of early menopause without estrogen hormone replacement, non-diabetic, no ASHD (atherosclerotic heart disease), no prior stroke/TIA, no peripheral vascular disease, and no history of aortic aneurysm.        Comments include: Pt is not fasting today for labs.        Habits & Providers  Alcohol-Tobacco-Diet     Alcohol drinks/day: 0     Tobacco Status: current      Tobacco Counseling: to remain off tobacco products     Cigarette Packs/Day: <0.25  Exercise-Depression-Behavior     Have you felt down or hopeless? no     Have you felt little pleasure in things? no     Depression Counseling: not indicated; screening negative for depression     Drug Use: current  Comments: PHQ-9 score = 14  Allergies (verified): No Known Drug Allergies  Social History: Packs/Day:  <0.25 Drug Use:  current  Review of Systems General:  Denies fever. Eyes:  Denies blurring. ENT:  Denies earache. CV:  Denies chest pain or discomfort. Resp:  Denies cough. GI:  Denies abdominal pain, nausea, and vomiting. GU:  Denies discharge. MS:  Complains of low back pain. Derm:  Denies rash. Neuro:  Denies headaches. Psych:  Denies depression.  Physical Exam  General:  alert.  obese Head:  normocephalic.   Eyes:  pupils equal and pupils round.   Ears:  bil TM with bony landmarks present clear fluid Nose:  no nasal discharge.   Mouth:  fair dentition.   Neck:  supple.   Chest Wall:  no mass.   Breasts:  pendulous Lungs:  normal breath sounds.   Heart:  normal rate and regular rhythm.   Abdomen:  soft, non-tender, and normal bowel sounds.   Rectal:  no external abnormalities.   Msk:  up to the exam table Pulses:  R radial normal and L radial normal.   Extremities:  no edema Neurologic:  alert & oriented X3.   Skin:  color normal.   Psych:  Oriented X3.    Pelvic Exam  Vulva:      normal appearance.   Urethra and Bladder:      Urethra--no discharge.   Vagina:      physiologic discharge.   Cervix:      midposition.   Uterus:      smooth.   Adnexa:      nontender bilaterally.   Rectum:      normal, heme negative stool.      Impression & Recommendations:  Problem # 1:  ROUTINE GYNECOLOGICAL EXAMINATION (ICD-V72.31) Tdap given today rec optho and dental exam PAP done PHQ-9 score = 14 Orders: Hemoccult Guaiac-1 spec.(in office) (82270) KOH/  WET Mount [phone]) Pap Smear, Thin Prep ( Collection of) (J8119) UA Dipstick w/o Micro (manual) (14782) T- GC Chlamydia (95621)  Problem # 2:  UNSPECIFIED BREAST SCREENING (ICD-V76.10) self breast exam placcard given mammogram scheduled Orders: Mammogram (Screening) (Mammo)  Problem # 3:  HYPERLIPIDEMIA (ICD-272.4) pt will need fasting labs  Problem # 4:  DEGENERATIVE DISC DISEASE, LUMBOSACRAL SPINE W/RADICULOPATHY (ICD-722.10) pt is continuing to go  to the specialist she will F/u in April  Problem # 5:  ESSENTIAL HYPERTENSION (ICD-401.9) BP stable  DASH diet Her updated medication list for this problem includes:    Hydrochlorothiazide 25 Mg Tabs (Hydrochlorothiazide) .Marland Kitchen... Take 1 tab by mouth every morning  Orders: T-TSH (35573-22025) T-Syphilis Test (RPR) [phone]) T-HIV Antibody  (Reflex) (83151-76160) T-CBC w/Diff (73710-62694) EKG w/ Interpretation (93000) T-Urine Microalbumin w/creat. ratio [phone])  Problem # 6:  OBESITY (ICD-278.00)  down 4 pounds since last visit - again encourage pt to keep up efforts at weight loss  Orders: Misc. Referral (Misc. Ref)  Complete Medication List: 1)  Hydrochlorothiazide 25 Mg Tabs (Hydrochlorothiazide) .... Take 1 tab by mouth every morning 2)  Mobic 15 Mg Tabs (Meloxicam) .... One tablet daily for joints 3)  Wellbutrin Xl 150 Mg Xr24h-tab (Bupropion hcl) .... Take 1 tablet by mouth two times a day (per [facility]) 4)  Flexeril 10 Mg Tabs (Cyclobenzaprine hcl) .... Take 1 tab by mouth at bedtime as needed for back pain  Other Orders: Tdap => 1yrs IM (18299) Admin 1st Vaccine (37169)  Hypertension Assessment/Plan:      The patient's hypertensive risk group is category B: At least one risk factor (excluding diabetes) with no target organ damage.  Today's blood pressure is 106/80.  Her blood pressure goal is < 140/90.  Lipid Assessment/Plan:      Based on NCEP/ATP III, the patient's risk factor category is "0-1 risk  factors".  The patient's lipid goals are as follows: Total cholesterol goal is 200; LDL cholesterol goal is 130; HDL cholesterol goal is 40; Triglyceride goal is 150.    Patient Instructions: 1)  Schedule a fasting lab visit - no food after midnight before this visit. Will need lipids 2)  Keep going to the back specialist as ordered.  Keep taking the medication as ordered by that office - meloxicam 3)  Weight - down 4 pounds since the last visit. 4)  You will be referred to the nutritionalist specialist 5)  Keep your appointment for mammogram as ordered   Orders Added: 1)  Est. Patient age 77-64 [phone] 2)  Hemoccult Guaiac-1 spec.(in office) [82270] 3)  KOH/ WET Mount [87210] 4)  Pap Smear, Thin Prep ( Collection of) [Q0091] 5)  UA Dipstick w/o Micro (manual) [81002] 6)  Mammogram (Screening) [Mammo] 7)  T-TSH [81017-51025] 8)  T-Syphilis Test (RPR) [85277-82423] 9)  T-HIV Antibody  (Reflex) [53614-43154] 10)  T-CBC w/Diff [00867-61950] 11)  T- GC Chlamydia [93267] 12)  EKG w/ Interpretation [93000] 13)  Misc. Referral [Misc. Ref] 14)  T-Urine Microalbumin w/creat. ratio [[id number]] 15)  Tdap => 61yrs IM [90715] 16)  Admin 1st Vaccine [12458]   Immunizations Administered:  Tetanus Vaccine:    Vaccine Type: Tdap    Site: left deltoid    Mfr: Sanofi Pasteur    Dose: 0.5 ml    Route: IM    Given by: Levon Hedger    Exp. Date: 10/31/2012    Lot #: [id number]    VIS given: 05/11/08 version given August 02, 2010.    ndc  09983-382-50  Immunizations Administered:  Tetanus Vaccine:    Vaccine Type: Tdap    Site: left deltoid    Mfr: Sanofi Pasteur    Dose: 0.5 ml    Route: IM    Given by: Levon Hedger    Exp. Date: 10/31/2012    Lot #: [id number]    VIS given: 05/11/08 version given August 02, 2010.  Influenza Vaccine (to be given today)  Laboratory Results   Urine Tests  Date/Time Received: August 02, 2010 2:56 PM   Routine Urinalysis   Color:  lt. yellow Appearance: Clear Glucose: negative   (Normal Range: Negative) Bilirubin: negative   (Normal Range: Negative) Ketone: negative   (Normal Range: Negative) Spec. Gravity: 1.010   (Normal Range: 1.003-1.035) Blood: negative   (Normal Range: Negative) pH: 6.5   (Normal Range: 5.0-8.0) Protein: trace   (Normal Range: Negative) Urobilinogen: 0.2   (Normal Range: 0-1) Nitrite: negative   (Normal Range: Negative) Leukocyte Esterace: negative   (Normal Range: Negative)    Date/Time Received: August 02, 2010   Wet Mount/KOH Source: vaginal WBC/hpf: 1-5 Bacteria/hpf: 1+ Clue cells/hpf: none Yeast/hpf: none Trichomonas/hpf: none  Stool - Occult Blood Hemmoccult #1: negative Date: 08/02/2010    Prevention & Chronic Care Immunizations   Influenza vaccine: Fluvax 3+  (04/25/2010)    Tetanus booster: 08/02/2010: Tdap    Pneumococcal vaccine: Not documented  Other Screening   Pap smear: Not documented    Mammogram: Not documented   Smoking status: current  (08/02/2010)   Smoking cessation counseling: yes  (08/02/2010)  Lipids   Total Cholesterol: Not documented   LDL: Not documented   LDL Direct: Not documented   HDL: Not documented   Triglycerides: Not documented    SGOT (AST): 20  (03/15/2010)   SGPT (ALT): 21  (03/15/2010)   Alkaline phosphatase: 52  (03/15/2010)   Total bilirubin: 0.3  (03/15/2010)  Hypertension   Last Blood Pressure: 106 / 80  (08/02/2010)   Serum creatinine: 0.83  (03/15/2010)   Serum potassium 4.7  (03/15/2010)  Self-Management Support :    Hypertension self-management support: Not documented    Lipid self-management support: Not documented    Nursing Instructions: Give tetanus booster today    EKG  Procedure date:  08/02/2010  Findings:      NSR    Laboratory Results   Urine Tests    Routine Urinalysis   Color: lt. yellow Appearance: Clear Glucose: negative   (Normal Range: Negative) Bilirubin:  negative   (Normal Range: Negative) Ketone: negative   (Normal Range: Negative) Spec. Gravity: 1.010   (Normal Range: 1.003-1.035) Blood: negative   (Normal Range: Negative) pH: 6.5   (Normal Range: 5.0-8.0) Protein: trace   (Normal Range: Negative) Urobilinogen: 0.2   (Normal Range: 0-1) Nitrite: negative   (Normal Range: Negative) Leukocyte Esterace: negative   (Normal Range: Negative)      Wet Mount Wet Mount KOH: Negative

## 2010-08-13 ENCOUNTER — Encounter (INDEPENDENT_AMBULATORY_CARE_PROVIDER_SITE_OTHER): Payer: Self-pay | Admitting: *Deleted

## 2010-08-14 ENCOUNTER — Other Ambulatory Visit: Payer: Self-pay | Admitting: Neurological Surgery

## 2010-08-14 DIAGNOSIS — M5126 Other intervertebral disc displacement, lumbar region: Secondary | ICD-10-CM

## 2010-08-14 DIAGNOSIS — M5136 Other intervertebral disc degeneration, lumbar region: Secondary | ICD-10-CM

## 2010-08-15 ENCOUNTER — Other Ambulatory Visit: Payer: Self-pay

## 2010-08-15 NOTE — Letter (Signed)
Summary: *HSN Results Follow up  [facility]  [address] [place], [place] [address]   Phone: [phone]  Fax: [phone]      08/08/2010   Cynthia Becker [address] [place], [place]  [address]   Dear  [name],                            ____S.Drinkard,FNP   ____D. Gore,FNP       ____B. McPherson,MD   ____V. Rankins,MD    ____E. Mulberry,MD    __X__N. Daphine Deutscher, FNP  ____D. Reche Dixon, MD    ____K. Philipp Deputy, MD    ____Other     This letter is to inform you that your recent test(s):  ___X____Pap Smear    _______Lab Test     _______X-ray    ___X____ is within acceptable limits  _______ requires a medication change  _______ requires a follow-up lab visit  _______ requires a follow-up visit with your provider   Comments: Pap Smear results are normal.       _________________________________________________________ If you have any questions, please contact our office [phone].                    Sincerely,    [name] FNP [facility]

## 2010-08-16 ENCOUNTER — Ambulatory Visit (HOSPITAL_COMMUNITY): Payer: Self-pay

## 2010-08-17 ENCOUNTER — Encounter (INDEPENDENT_AMBULATORY_CARE_PROVIDER_SITE_OTHER): Payer: Self-pay | Admitting: Nurse Practitioner

## 2010-08-17 LAB — CONVERTED CEMR LAB
Cholesterol: 188 mg/dL (ref 0–200)
HDL: 43 mg/dL (ref >39)
LDL Cholesterol: 127 mg/dL — ABNORMAL HIGH (ref 0–99)
Triglycerides: 91 mg/dL (ref <150)

## 2010-08-21 ENCOUNTER — Ambulatory Visit
Admission: RE | Admit: 2010-08-21 | Discharge: 2010-08-21 | Disposition: A | Discharge status: Home / Self Care | Payer: No Typology Code available for payment source | Source: Ambulatory Visit | Attending: Neurological Surgery | Admitting: Neurological Surgery

## 2010-08-21 DIAGNOSIS — M5136 Other intervertebral disc degeneration, lumbar region: Secondary | ICD-10-CM

## 2010-08-21 DIAGNOSIS — M5126 Other intervertebral disc displacement, lumbar region: Secondary | ICD-10-CM

## 2010-08-21 NOTE — Letter (Signed)
Summary: *HSN Results Follow up  [facility]  [address] [place], [place] [address]   Phone: [phone]  Fax: [phone]      08/13/2010   Irianna Moyers [address] [place], [place]  [address]   Dear  Ms. Alesa Woerner,                            ____S.Drinkard,FNP   ____D. Gore,FNP       ____B. McPherson,MD   ____V. Rankins,MD    ____E. Mulberry,MD    ____N. Daphine Deutscher, FNP  ____D. Reche Dixon, MD    ____K. Philipp Deputy, MD    ____Other     This letter is to inform you that your recent test(s):  _______Pap Smear    _______Lab Test     _______X-ray    _______ is within acceptable limits  _______ requires a medication change  _______ requires a follow-up lab visit  _______ requires a follow-up visit with your provider   Comments:  We have been trying to contact you at [phone] and [phone].  Please contact the office at your earliest convenience.       _________________________________________________________ If you have any questions, please contact our office                     Sincerely,  Levon Hedger [facility]

## 2010-08-22 ENCOUNTER — Encounter (INDEPENDENT_AMBULATORY_CARE_PROVIDER_SITE_OTHER): Payer: Self-pay | Admitting: Nurse Practitioner

## 2010-08-23 ENCOUNTER — Ambulatory Visit (HOSPITAL_COMMUNITY): Payer: Self-pay | Attending: Internal Medicine

## 2010-08-30 NOTE — Letter (Signed)
Summary: Lipid Letter  [facility]  [address] [place], [place] [address]   Phone: [phone]  Fax: [phone]    08/22/2010  Cynthia Becker [address] [place], [place]  [address]  Dear Cynthia Becker:  We have carefully reviewed your last lipid profile from 08/17/2010 and the results are noted below with a summary of recommendations for lipid management.    Cholesterol:       188     Goal: less than 200   HDL "good" Cholesterol:   43     Goal: greater than 40   LDL "bad" Cholesterol:   127     Goal: less than 130   Triglycerides:       91     Goal: less than 150    Cholesterol labs are ok. Keep taking your cholesterol medications as ordered.      Current Medications: 1)    Hydrochlorothiazide 25 Mg  Tabs (Hydrochlorothiazide) .... Take 1 tab by mouth every morning 2)    Mobic 15 Mg Tabs (Meloxicam) .... One tablet daily for joints 3)    Wellbutrin Xl 150 Mg Xr24h-tab (Bupropion hcl) .... Take 1 tablet by mouth two times a day (per [facility]) 4)    Flexeril 10 Mg Tabs (Cyclobenzaprine hcl) .... Take 1 tab by mouth at bedtime as needed for back pain 5)    Ferrous Sulfate 325 (65 Fe) Mg Tbec (Ferrous sulfate) .... One tablet by mouth daily 6)    Colace 100 Mg Caps (Docusate sodium) .... One capsule by mouth two times a day  If you have any questions, please call. We appreciate being able to work with you.   Sincerely,    [facility] [name], FNP

## 2010-09-05 LAB — POCT PREGNANCY, URINE: Preg Test, Ur: NEGATIVE

## 2010-09-05 LAB — PREGNANCY, URINE: Preg Test, Ur: NEGATIVE

## 2010-09-05 LAB — URINALYSIS, ROUTINE W REFLEX MICROSCOPIC
Bilirubin Urine: NEGATIVE
Glucose, UA: NEGATIVE mg/dL
Hgb urine dipstick: NEGATIVE
Ketones, ur: NEGATIVE mg/dL
Nitrite: NEGATIVE
pH: 7 (ref 5.0–8.0)

## 2010-09-05 LAB — DIFFERENTIAL
Eosinophils Relative: 6 % — ABNORMAL HIGH (ref 0–5)
Lymphocytes Relative: 22 % (ref 12–46)
Lymphs Abs: 2.2 10*3/uL (ref 0.7–4.0)
Monocytes Relative: 5 % (ref 3–12)
Neutrophils Relative %: 66 % (ref 43–77)

## 2010-09-05 LAB — LIPASE, BLOOD: Lipase: 24 U/L (ref 11–59)

## 2010-09-05 LAB — COMPREHENSIVE METABOLIC PANEL
Albumin: 3.5 g/dL (ref 3.5–5.2)
Albumin: 3.6 g/dL (ref 3.5–5.2)
Alkaline Phosphatase: 99 U/L (ref 39–117)
BUN: 10 mg/dL (ref 6–23)
BUN: 8 mg/dL (ref 6–23)
Calcium: 8.9 mg/dL (ref 8.4–10.5)
Calcium: 9.2 mg/dL (ref 8.4–10.5)
Creatinine, Ser: 0.77 mg/dL (ref 0.4–1.2)
Glucose, Bld: 104 mg/dL — ABNORMAL HIGH (ref 70–99)
Potassium: 4 mEq/L (ref 3.5–5.1)
Sodium: 136 mEq/L (ref 135–145)
Total Protein: 7.4 g/dL (ref 6.0–8.3)
Total Protein: 7.9 g/dL (ref 6.0–8.3)

## 2010-09-05 LAB — CBC
MCV: 58.5 fL — ABNORMAL LOW (ref 78.0–100.0)
Platelets: 586 10*3/uL — ABNORMAL HIGH (ref 150–400)
RDW: 21.8 % — ABNORMAL HIGH (ref 11.5–15.5)
WBC: 9.8 10*3/uL (ref 4.0–10.5)

## 2010-09-05 LAB — SURGICAL PCR SCREEN
MRSA, PCR: NEGATIVE
Staphylococcus aureus: NEGATIVE

## 2010-09-05 LAB — URINE MICROSCOPIC-ADD ON

## 2010-09-14 ENCOUNTER — Encounter (INDEPENDENT_AMBULATORY_CARE_PROVIDER_SITE_OTHER): Payer: Self-pay | Admitting: Nurse Practitioner

## 2010-09-14 LAB — CONVERTED CEMR LAB
Hemoglobin: 12.1 g/dL (ref 12.0–15.0)
MCHC: 32.6 g/dL (ref 30.0–36.0)
MCV: 58.1 fL — ABNORMAL LOW (ref 78.0–100.0)
RBC: 6.39 M/uL — ABNORMAL HIGH (ref 3.87–5.11)
WBC: 7.1 10*3/uL (ref 4.0–10.5)

## 2010-09-20 ENCOUNTER — Encounter (INDEPENDENT_AMBULATORY_CARE_PROVIDER_SITE_OTHER): Payer: Self-pay | Admitting: *Deleted

## 2010-09-25 NOTE — Letter (Signed)
Summary: *HSN Results Follow up  [facility]  [address] [place], [place] [address]   Phone: [phone]  Fax: [phone]      09/20/2010   Cynthia Becker [address] [place], [place]  [address]   Dear  [name],                            ____S.Drinkard,FNP   ____D. Gore,FNP       ____B. McPherson,MD   ____V. Rankins,MD    ____E. Mulberry,MD    ____N. Daphine Deutscher, FNP  ____D. Reche Dixon, MD    ____K. Philipp Deputy, MD    ____Other     This letter is to inform you that your recent test(s)    ____X___Lab Test         _______ is within acceptable limits  _______ requires a medication change  _______ requires a follow-up lab visit  _______ requires a follow-up visit with your provider   Comments:  We have been trying to reach you at [phone].  Please contact the office at your earliest convenience.  [phone].        _________________________________________________________ If you have any questions, please contact our office                     Sincerely,  Cynthia Becker [facility]

## 2010-11-17 ENCOUNTER — Emergency Department (HOSPITAL_COMMUNITY)
Admission: EM | Admit: 2010-11-17 | Discharge: 2010-11-17 | Disposition: A | Discharge status: Home / Self Care | Payer: Self-pay | Attending: Emergency Medicine | Admitting: Emergency Medicine

## 2010-11-17 DIAGNOSIS — IMO0002 Reserved for concepts with insufficient information to code with codable children: Secondary | ICD-10-CM | POA: Insufficient documentation

## 2010-11-17 DIAGNOSIS — I10 Essential (primary) hypertension: Secondary | ICD-10-CM | POA: Insufficient documentation

## 2010-11-17 DIAGNOSIS — J069 Acute upper respiratory infection, unspecified: Secondary | ICD-10-CM | POA: Insufficient documentation

## 2010-11-17 DIAGNOSIS — T169XXA Foreign body in ear, unspecified ear, initial encounter: Secondary | ICD-10-CM | POA: Insufficient documentation

## 2011-01-25 ENCOUNTER — Other Ambulatory Visit: Payer: Self-pay | Admitting: Internal Medicine

## 2011-01-25 DIAGNOSIS — M542 Cervicalgia: Secondary | ICD-10-CM

## 2011-01-30 ENCOUNTER — Ambulatory Visit (HOSPITAL_COMMUNITY)
Admission: RE | Admit: 2011-01-30 | Discharge: 2011-01-30 | Disposition: A | Discharge status: Home / Self Care | Payer: Self-pay | Source: Ambulatory Visit | Attending: Internal Medicine | Admitting: Internal Medicine

## 2011-01-30 DIAGNOSIS — M5030 Other cervical disc degeneration, unspecified cervical region: Secondary | ICD-10-CM | POA: Insufficient documentation

## 2011-01-30 DIAGNOSIS — M25519 Pain in unspecified shoulder: Secondary | ICD-10-CM | POA: Insufficient documentation

## 2011-01-30 DIAGNOSIS — M79609 Pain in unspecified limb: Secondary | ICD-10-CM | POA: Insufficient documentation

## 2011-01-30 DIAGNOSIS — M47812 Spondylosis without myelopathy or radiculopathy, cervical region: Secondary | ICD-10-CM | POA: Insufficient documentation

## 2011-01-30 DIAGNOSIS — M542 Cervicalgia: Secondary | ICD-10-CM | POA: Insufficient documentation

## 2011-02-19 ENCOUNTER — Ambulatory Visit: Payer: Self-pay | Attending: Internal Medicine | Admitting: Physical Therapy

## 2011-02-19 DIAGNOSIS — M2569 Stiffness of other specified joint, not elsewhere classified: Secondary | ICD-10-CM | POA: Insufficient documentation

## 2011-02-19 DIAGNOSIS — IMO0001 Reserved for inherently not codable concepts without codable children: Secondary | ICD-10-CM | POA: Insufficient documentation

## 2011-02-19 DIAGNOSIS — M542 Cervicalgia: Secondary | ICD-10-CM | POA: Insufficient documentation

## 2011-02-22 ENCOUNTER — Ambulatory Visit: Payer: Self-pay | Admitting: Physical Therapy

## 2011-02-26 ENCOUNTER — Ambulatory Visit: Payer: Self-pay | Attending: Internal Medicine | Admitting: Physical Therapy

## 2011-02-26 DIAGNOSIS — M2569 Stiffness of other specified joint, not elsewhere classified: Secondary | ICD-10-CM | POA: Insufficient documentation

## 2011-02-26 DIAGNOSIS — IMO0001 Reserved for inherently not codable concepts without codable children: Secondary | ICD-10-CM | POA: Insufficient documentation

## 2011-02-26 DIAGNOSIS — M542 Cervicalgia: Secondary | ICD-10-CM | POA: Insufficient documentation

## 2011-02-28 ENCOUNTER — Ambulatory Visit: Payer: Self-pay | Admitting: Physical Therapy

## 2011-02-28 ENCOUNTER — Other Ambulatory Visit: Payer: Self-pay | Admitting: Physician Assistant

## 2011-03-05 ENCOUNTER — Ambulatory Visit: Payer: Self-pay | Admitting: Physical Therapy

## 2011-03-07 ENCOUNTER — Ambulatory Visit: Payer: Self-pay | Admitting: Physical Therapy

## 2011-03-10 ENCOUNTER — Other Ambulatory Visit: Payer: Self-pay | Admitting: Physician Assistant

## 2011-03-12 ENCOUNTER — Ambulatory Visit: Payer: Self-pay | Admitting: Physical Therapy

## 2011-03-15 ENCOUNTER — Ambulatory Visit: Payer: Self-pay | Admitting: Physical Therapy

## 2011-03-20 ENCOUNTER — Ambulatory Visit: Payer: Self-pay | Admitting: Rehabilitation

## 2011-03-22 ENCOUNTER — Ambulatory Visit: Payer: Self-pay | Admitting: Physical Therapy

## 2011-03-25 LAB — COMPREHENSIVE METABOLIC PANEL
ALT: 15
AST: 21
Alkaline Phosphatase: 47
Calcium: 8.7
GFR calc Af Amer: >60
Glucose, Bld: 99
Potassium: 4.1
Sodium: 139
Total Protein: 6.6

## 2011-03-25 LAB — CBC
Hemoglobin: 13.1
MCHC: 32.6
RBC: 5.63 — ABNORMAL HIGH
RDW: 17 — ABNORMAL HIGH

## 2011-03-25 LAB — URINALYSIS, ROUTINE W REFLEX MICROSCOPIC
Bilirubin Urine: NEGATIVE
Glucose, UA: NEGATIVE
Ketones, ur: NEGATIVE
Leukocytes, UA: NEGATIVE
Nitrite: NEGATIVE
Specific Gravity, Urine: 1.011
pH: 6

## 2011-03-25 LAB — URINE MICROSCOPIC-ADD ON

## 2011-03-25 LAB — DIFFERENTIAL
Basophils Relative: 1
Eosinophils Absolute: 0.7
Eosinophils Relative: 11 — ABNORMAL HIGH
Lymphs Abs: 1.8
Monocytes Absolute: 0.5
Monocytes Relative: 9

## 2011-03-26 ENCOUNTER — Ambulatory Visit: Payer: Self-pay | Attending: Internal Medicine | Admitting: Physical Therapy

## 2011-03-26 DIAGNOSIS — M2569 Stiffness of other specified joint, not elsewhere classified: Secondary | ICD-10-CM | POA: Insufficient documentation

## 2011-03-26 DIAGNOSIS — M542 Cervicalgia: Secondary | ICD-10-CM | POA: Insufficient documentation

## 2011-03-26 DIAGNOSIS — IMO0001 Reserved for inherently not codable concepts without codable children: Secondary | ICD-10-CM | POA: Insufficient documentation

## 2011-03-27 ENCOUNTER — Ambulatory Visit: Payer: Self-pay | Admitting: Physical Therapy

## 2011-04-02 ENCOUNTER — Ambulatory Visit: Payer: Self-pay | Admitting: Physical Therapy

## 2011-04-22 ENCOUNTER — Encounter: Payer: Self-pay | Attending: Physical Medicine and Rehabilitation | Admitting: Physical Medicine and Rehabilitation

## 2011-04-22 DIAGNOSIS — G8929 Other chronic pain: Secondary | ICD-10-CM | POA: Insufficient documentation

## 2011-04-22 DIAGNOSIS — M5030 Other cervical disc degeneration, unspecified cervical region: Secondary | ICD-10-CM | POA: Insufficient documentation

## 2011-04-22 DIAGNOSIS — M545 Low back pain, unspecified: Secondary | ICD-10-CM | POA: Insufficient documentation

## 2011-04-22 DIAGNOSIS — M25519 Pain in unspecified shoulder: Secondary | ICD-10-CM | POA: Insufficient documentation

## 2011-04-22 DIAGNOSIS — L659 Nonscarring hair loss, unspecified: Secondary | ICD-10-CM | POA: Insufficient documentation

## 2011-04-22 DIAGNOSIS — M25549 Pain in joints of unspecified hand: Secondary | ICD-10-CM

## 2011-04-22 DIAGNOSIS — M542 Cervicalgia: Secondary | ICD-10-CM

## 2011-04-22 DIAGNOSIS — M79609 Pain in unspecified limb: Secondary | ICD-10-CM | POA: Insufficient documentation

## 2011-04-22 DIAGNOSIS — R209 Unspecified disturbances of skin sensation: Secondary | ICD-10-CM | POA: Insufficient documentation

## 2011-04-22 NOTE — Progress Notes (Signed)
Cynthia Becker is a pleasant married African American woman who is referred by Dr. Yisroel Ramming.  Cynthia Becker presents with a chief complaint of chronic neck pain as well as bilateral hand numbness and tingling which radiates to the forearm nocturnally. This has been going on for several months now.  She has had an MRI of her cervical spine.  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  Cervical spondylosis and degenerative disk disease wit no significant evidence of abnormal cord signal identified.  She also has a history of some low back pain and has L5-S1 degenerative disk disease.  In addition, she notes poor sleep.  She is not sure what aggravates her pain.  She has been using tramadol and gabapentin, and is getting a little relief with it.  FUNCTIONAL STATUS:  She can walk 10 minutes at a time.  She is able to climb stairs and drive.  She is independent with self-care and helps out with some housekeeping tasks cooking and light cleaning. 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  She has an eighth grade education.  She denies problems controlling bowel or bladder.  She does report some numbness especially in her hands, right worse than left.  Admits to depression and  anxiety, denies suicidal ideation.  She has occasional constipation.  REVIEW OF SYSTEMS:  Otherwise negative.  PAST MEDICAL HISTORY:  Positive for hypertension.  PAST SURGICAL HISTORY:  Positive for gallbladder surgery April 04, 2010.  Incidentally, further discussion reveals she has had swelling in her hands and lower extremities at times, as well as alopecia.  SOCIAL HISTORY:  The patient lives with her mother,  her brother, her husband and 2 sons, 55 and 34 year old, denies illegal substance use. Reports rare alcohol use and smokes about 4 cigarettes a day.  FAMILY HISTORY:  Positive for heart disease, diabetes, high  blood pressure, psychiatric problems, alcohol abuse, and disability.  PHYSICAL EXAMINATION:  VITAL SIGNS:  Today, her blood pressure is 139/90, pulse 84, respirations 18, 97% saturated on room air. GENERAL:  She is a well-developed, obese woman who does not appear in any distress.  She appears her stated age.  She is noted to be wearing a wig. NEUROLOGICAL:  She is oriented times 3.  Her speech is clear.  Her affect is bright.  She is alert, cooperative, and pleasant.  Follows commands without difficulty.  Answers my questions appropriately. Cranial nerves, coordination are intact. MUSCULOSKELETAL:  Her reflexes are 2+ at biceps, triceps, brachioradialis, 2+ at patellar and Achilles tendon. Sensory testing reveals diminished sensation patchy in bilateral upper extremities, but is more significant decreased sensation in the median distribution bilaterally.  She has intact lower extremity sensation.  Her motor strength is quite good in both upper and lower extremities without obvious focal weakness.  Transitions from sitting to standing easily her gait is non antalgic. Tandem gait and Romberg test are performed adequately.  She is able to walk on heels and toes without difficulty.  She has mildly decreased range of motion with respect to her neck and complains of some pain with end range with rotation bilaterally as well as extension.  Lumbar range of motion is noted for normal motion with forward flexion, extension is minimally limited but she does complain of pain with lumbar extension.  Internal and external rotation at the hips does not aggravate pain. Internal and external rotation at the shoulders does  aggravate her along the lateral border of the deltoid as well as she does have some tenderness especially on the left along the bicipital tendon.  IMPRESSION: 1. Cervicalgia.  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 2. Bilateral shoulder pain.  Cannot rule out bicipital     tendonitis/rotator cuff/shoulder impingement. 3. Bilateral hand pain and numbness nocturnally. 4. Chronic low back pain. 5. Alopecia. 6. Intermittent hand swelling.  PLAN:  She would like to pursue further workup.  We will obtain thyroid function tests, TSH, rheumatoid factor, ANA, and sed rate, as well as check the glucose and TSH.  We would also like to check electrodiagnostic studies to rule out carpal tunnel syndrome as well.  We will check above studies prior to placing her on any further medication at this point.  She is in agreement with this.  I will see her back after the blood work is completed and electrodiagnostic studies are done as well.  We will also check urine drug screen.  I have answered all her questions.  She is comfortable with our plan currently.     Brantley Stage, M.D. Electronically Signed    DMK/MedQ D:  04/22/2011 09:33:07  T:  04/22/2011 19:45:49  Job #:  [id number]

## 2011-04-24 ENCOUNTER — Emergency Department (HOSPITAL_COMMUNITY)
Admission: EM | Admit: 2011-04-24 | Discharge: 2011-04-24 | Disposition: A | Discharge status: Home / Self Care | Payer: Self-pay | Attending: Emergency Medicine | Admitting: Emergency Medicine

## 2011-04-24 ENCOUNTER — Emergency Department (HOSPITAL_COMMUNITY): Payer: Self-pay

## 2011-04-24 DIAGNOSIS — R059 Cough, unspecified: Secondary | ICD-10-CM | POA: Insufficient documentation

## 2011-04-24 DIAGNOSIS — J40 Bronchitis, not specified as acute or chronic: Secondary | ICD-10-CM | POA: Insufficient documentation

## 2011-04-24 DIAGNOSIS — F17200 Nicotine dependence, unspecified, uncomplicated: Secondary | ICD-10-CM | POA: Insufficient documentation

## 2011-04-24 DIAGNOSIS — R05 Cough: Secondary | ICD-10-CM | POA: Insufficient documentation

## 2011-04-24 DIAGNOSIS — D649 Anemia, unspecified: Secondary | ICD-10-CM | POA: Insufficient documentation

## 2011-04-24 DIAGNOSIS — R0602 Shortness of breath: Secondary | ICD-10-CM | POA: Insufficient documentation

## 2011-04-24 DIAGNOSIS — I10 Essential (primary) hypertension: Secondary | ICD-10-CM | POA: Insufficient documentation

## 2011-04-24 DIAGNOSIS — R079 Chest pain, unspecified: Secondary | ICD-10-CM | POA: Insufficient documentation

## 2011-04-24 LAB — BASIC METABOLIC PANEL
BUN: 9 mg/dL (ref 6–23)
Calcium: 9.5 mg/dL (ref 8.4–10.5)
Chloride: 101 mEq/L (ref 96–112)
Creatinine, Ser: 0.59 mg/dL (ref 0.50–1.10)
GFR calc Af Amer: >90 mL/min (ref >90)

## 2011-04-24 LAB — CBC
HCT: 30.3 % — ABNORMAL LOW (ref 36.0–46.0)
MCH: 16 pg — ABNORMAL LOW (ref 26.0–34.0)
MCHC: 30.4 g/dL (ref 30.0–36.0)
MCV: 52.8 fL — ABNORMAL LOW (ref 78.0–100.0)
Platelets: 734 10*3/uL — ABNORMAL HIGH (ref 150–400)
RDW: 20.6 % — ABNORMAL HIGH (ref 11.5–15.5)
WBC: 8.2 10*3/uL (ref 4.0–10.5)

## 2011-04-24 MED ORDER — TECHNETIUM TO 99M ALBUMIN AGGREGATED
6.0000 | Freq: Once | INTRAVENOUS | Status: AC | PRN
  Administered 2011-04-24: 6 via INTRAVENOUS

## 2011-04-24 MED ORDER — XENON XE 133 GAS
10.0000 | GAS_FOR_INHALATION | Freq: Once | RESPIRATORY_TRACT | Status: AC | PRN
  Administered 2011-04-24: 10 via RESPIRATORY_TRACT

## 2011-05-08 ENCOUNTER — Encounter: Payer: Self-pay | Attending: Physical Medicine and Rehabilitation | Admitting: Physical Medicine and Rehabilitation

## 2011-05-08 DIAGNOSIS — M542 Cervicalgia: Secondary | ICD-10-CM

## 2011-05-08 DIAGNOSIS — M79609 Pain in unspecified limb: Secondary | ICD-10-CM | POA: Insufficient documentation

## 2011-05-08 DIAGNOSIS — G894 Chronic pain syndrome: Secondary | ICD-10-CM

## 2011-05-08 DIAGNOSIS — R209 Unspecified disturbances of skin sensation: Secondary | ICD-10-CM

## 2011-05-08 DIAGNOSIS — L659 Nonscarring hair loss, unspecified: Secondary | ICD-10-CM | POA: Insufficient documentation

## 2011-05-08 NOTE — Assessment & Plan Note (Signed)
Cynthia Becker is a pleasant married African American woman who is referred by Dr. Yisroel Ramming.  Her initial visit was April 22, 2011.  She has a approximately 1 year history of neck pain and bilateral upper extremity pain, which is worse at night.  She has a history of alopecia as well and numbness and tingling in her extremities.  At the last visit, some blood tests were ordered which included thyroid function tests, ANA, rheumatoid factor, vitamin B12, TSH, and glucose all were within normal limits.  Vitamin B12 was under 500 but was within the normal range.  Her average pain is about a 6 on a scale of 10.  She has known mild degenerative changes in her cervical spine per MRI which was done in August 2012 ordered by her primary care physician.  Her average pain is about a 6 on a scale of 10, worse at night, especially the hands.  She has taken Ultram in the past but is currently not taking it.  She is using gabapentin 300 mg 3 times a day and 2 at bedtime.  REVIEW OF SYSTEMS:  Unchanged from previous visit other than she has had some problems with shortness of breath and has had some recent x-rays apparently.  No other changes in social or family history.  PHYSICAL EXAMINATION:  VITAL SIGNS:  She is 5 foot 5 inch woman weighing 312 pounds.  Her blood pressure is 142/86, pulse 79, respirations 18, 97% saturated on room air. NEUROLOGIC:  She is oriented x3.  Speech is clear.  Affect is bright. She is alert, cooperative, and pleasant.  Follows commands without difficulty.  Answers my questions appropriately.  Cranial nerves, coordination are intact.  Reflexes are diminished both upper and lower extremities without abnormal tone, clonus, or tremors.  Motor strength is quite good in both upper as well as lower extremities without focal deficit.  She reports some sensory deficits intermittently in the upper extremities, currently not present.  Transitions easily from sitting to  standing.  Gait is nonantalgic.  She is able to tandem walk today without difficulty.  Romberg test is performed adequately.  She has minimal limitations in cervical range of motion.  She has rather well preserved shoulder range as well.  She has some complaints of low back pain with forward flexion and range extension.  IMPRESSION: 1. 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 2. Bilateral hand numbness and tingling, painful predominantly at     night.  PLAN:  We will obtain EMG nerve conduction studies of bilateral upper extremities to rule out carpal tunnel.  We will give her a prescription for Ultracet 1-2 p.o. at bedtime.  I would like her to continue her gabapentin 300 mg 1, 3 times a day and 2 at bedtime.  She is comfortable with the treatment plan and workup at this time.  I have asked her to maintain contact with her primary care regarding her other medical problems.  I will see her back in a month.     Brantley Stage, M.D. Electronically Signed    DMK/MedQ D:  05/08/2011 12:59:40  T:  05/08/2011 18:15:33  Job #:  [id number]

## 2011-05-24 ENCOUNTER — Encounter: Payer: Self-pay | Attending: Physical Medicine and Rehabilitation | Admitting: Physical Medicine and Rehabilitation

## 2011-05-24 DIAGNOSIS — M542 Cervicalgia: Secondary | ICD-10-CM

## 2011-05-24 DIAGNOSIS — R279 Unspecified lack of coordination: Secondary | ICD-10-CM

## 2011-05-24 DIAGNOSIS — R209 Unspecified disturbances of skin sensation: Secondary | ICD-10-CM | POA: Insufficient documentation

## 2011-05-29 ENCOUNTER — Other Ambulatory Visit: Payer: Self-pay | Admitting: Physician Assistant

## 2011-06-15 ENCOUNTER — Other Ambulatory Visit: Payer: Self-pay | Admitting: Physician Assistant

## 2011-07-05 ENCOUNTER — Encounter: Payer: Self-pay | Attending: Physical Medicine and Rehabilitation | Admitting: Physical Medicine and Rehabilitation

## 2011-07-05 DIAGNOSIS — M545 Low back pain, unspecified: Secondary | ICD-10-CM | POA: Insufficient documentation

## 2011-07-05 DIAGNOSIS — R209 Unspecified disturbances of skin sensation: Secondary | ICD-10-CM | POA: Insufficient documentation

## 2011-07-05 DIAGNOSIS — G5600 Carpal tunnel syndrome, unspecified upper limb: Secondary | ICD-10-CM | POA: Insufficient documentation

## 2011-07-05 DIAGNOSIS — M542 Cervicalgia: Secondary | ICD-10-CM | POA: Insufficient documentation

## 2011-07-05 DIAGNOSIS — M79609 Pain in unspecified limb: Secondary | ICD-10-CM | POA: Insufficient documentation

## 2011-07-06 NOTE — Procedures (Signed)
NAMEDREMA, EDDINGTON               ACCOUNT NO.:  [id number]  MEDICAL RECORD NO.:  [id number]           PATIENT TYPE:  O  LOCATION:  PRM                          FACILITY:  [facility]  PHYSICIAN:  Brantley Stage, M.D.DATE OF BIRTH:  [birth year]  DATE OF PROCEDURE:  07/05/2011 DATE OF DISCHARGE:                              OPERATIVE REPORT  [name] is a very pleasant 41 year old African American woman who is followed here at the [facility]. She has multiple pain complaints including bilateral hand pain and numbness.  She was recently diagnosed with carpal tunnel syndrome per electrodiagnostic studies, which were done on May 24, 2011.  The results of the studies were reviewed with her.  She also has occasional neck and low back pain.  Recent blood work was reviewed with her as well today.  Glucose, TSH, vitamin B12, rheumatoid factor, and ANA were all checked on April 22, 2011.  The results are reviewed with her as well. Glucose was normal.  TSH was 1.077, which was in the normal range. Vitamin B12 was 456, which was also within the normal range.  Rheumatoid factor was less than 10, which was normal, and ANA was negative.  She continues to report significant complaints of numbness and tingling in her hand average of an 8 or 9 on a scale of 10 interfering with sleep.  She is getting very little relief with nonsteroidal anti- inflammatory medication and gabapentin.  Functional tasks are limited as well.  She has difficulty performing household duties due to increased the numbness and tingling in the hand.  REVIEW OF SYSTEMS:  Otherwise noncontributory.  She had a recent cold. She maintains contact with primary care for her other medical problems.  PAST MEDICAL, SOCIAL, FAMILY HISTORY:  Otherwise unchanged.  PHYSICAL EXAMINATION:  Her blood pressure is 144/78, pulse is 91, respirations 16, 95% saturated on room air.  She is 315 pounds  and 65 inches tall.  She is a well-developed and obese woman who does not appear in any distress.  She is oriented x3.  Speech is clear.  Affect is bright.  She is alert, cooperative, and pleasant.  Follows commands without difficulty.  Answers my questions appropriately.  Her upper extremities are examined today.  She has normal reflexes at biceps, triceps, brachioradialis.  She has good strength in the upper extremity. She has decreased sensation in the thumb, index and middle finger. Negative Tinel sign is noted.  Her hands are warm.  IMPRESSION:  Bilateral carpal tunnel syndrome per electrodiagnostic studies recently performed.  We discussed treatment options.  She has been using splints and nonsteroidals.  She did not get much relief with either of these things for the last several weeks.  We discussed considering oral steroid versus injection versus referral to surgeon. She would like to pursue injection.  I reviewed the risks and benefits of this with her today including bleeding, bruising, infection, nerve injury.  She would like to move forward with injection.  The area at the proximal wrist crease was prepped with alcohol and Betadine.  The area was marked.  A  0.5 mL of Kenalog using a 0.5 inch 27-gauge needle was injected without problems into each wrist.  Band-Aid was placed over the area.  Discharge instructions were given and I will see her back in 3 weeks.  I gave her another prescription for Mobic 7.5, 1 p.o. daily p.r.n.  I have answered all her questions.  She is comfortable with our plan.     Brantley Stage, M.D. Electronically Signed    DMK/MEDQ  D:  07/05/2011 09:55:13  T:  07/06/2011 03:30:31  Job:  [id number]

## 2011-07-23 DIAGNOSIS — I10 Essential (primary) hypertension: Secondary | ICD-10-CM | POA: Insufficient documentation

## 2011-07-25 ENCOUNTER — Encounter: Payer: Self-pay | Attending: Neurosurgery | Admitting: Neurosurgery

## 2011-07-25 DIAGNOSIS — M25549 Pain in joints of unspecified hand: Secondary | ICD-10-CM

## 2011-07-25 DIAGNOSIS — G5600 Carpal tunnel syndrome, unspecified upper limb: Secondary | ICD-10-CM | POA: Insufficient documentation

## 2011-07-25 NOTE — Assessment & Plan Note (Signed)
This is a patient of Kirchmayer's, who she injected on July 05, 2011 for carpal tunnel.  She states that Dr. Pamelia Hoit told her to come back today in followup that if the injections did not help, she was going to refer to a hand surgeon.  The patient was placed on my schedule.  She does not need any refills or any exam.  She states it did not help and she does want the referral, so I am going to refer her over to [facility] A. Mina Marble, MD at the [facility].  The patient is in agreement with that.  Her questions were encouraged and answered, and she will follow up with Dr. Pamelia Hoit as scheduled.     Labrian Torregrossa L. Blima Dessert Electronically Signed    RLW/MedQ D:  07/25/2011 11:02:38  T:  07/25/2011 11:28:06  Job #:  [id number]

## 2011-07-26 ENCOUNTER — Ambulatory Visit: Payer: Self-pay | Admitting: Physical Medicine and Rehabilitation

## 2011-08-26 ENCOUNTER — Other Ambulatory Visit: Payer: Self-pay | Admitting: Orthopedic Surgery

## 2011-08-27 ENCOUNTER — Encounter (HOSPITAL_BASED_OUTPATIENT_CLINIC_OR_DEPARTMENT_OTHER): Payer: Self-pay | Admitting: *Deleted

## 2011-08-27 NOTE — Progress Notes (Signed)
To come in for bmet.ekg-denies any cardiac,sleep apnea,resp distress. No daily inhalers

## 2011-08-29 ENCOUNTER — Encounter (HOSPITAL_BASED_OUTPATIENT_CLINIC_OR_DEPARTMENT_OTHER): Payer: Self-pay | Admitting: *Deleted

## 2011-08-30 ENCOUNTER — Encounter (HOSPITAL_BASED_OUTPATIENT_CLINIC_OR_DEPARTMENT_OTHER)
Admission: RE | Disposition: A | Discharge status: Home / Self Care | Payer: Self-pay | Source: Ambulatory Visit | Attending: Orthopedic Surgery

## 2011-08-30 ENCOUNTER — Encounter (HOSPITAL_BASED_OUTPATIENT_CLINIC_OR_DEPARTMENT_OTHER): Payer: Self-pay | Admitting: *Deleted

## 2011-08-30 ENCOUNTER — Encounter (HOSPITAL_BASED_OUTPATIENT_CLINIC_OR_DEPARTMENT_OTHER): Payer: Self-pay | Admitting: Anesthesiology

## 2011-08-30 ENCOUNTER — Ambulatory Visit (HOSPITAL_BASED_OUTPATIENT_CLINIC_OR_DEPARTMENT_OTHER): Payer: Self-pay | Admitting: Anesthesiology

## 2011-08-30 ENCOUNTER — Ambulatory Visit (HOSPITAL_BASED_OUTPATIENT_CLINIC_OR_DEPARTMENT_OTHER)
Admission: RE | Admit: 2011-08-30 | Discharge: 2011-08-30 | Disposition: A | Discharge status: Home / Self Care | Payer: Self-pay | Source: Ambulatory Visit | Attending: Orthopedic Surgery | Admitting: Orthopedic Surgery

## 2011-08-30 ENCOUNTER — Other Ambulatory Visit: Payer: Self-pay

## 2011-08-30 DIAGNOSIS — J45909 Unspecified asthma, uncomplicated: Secondary | ICD-10-CM | POA: Insufficient documentation

## 2011-08-30 DIAGNOSIS — G5600 Carpal tunnel syndrome, unspecified upper limb: Secondary | ICD-10-CM | POA: Insufficient documentation

## 2011-08-30 DIAGNOSIS — I10 Essential (primary) hypertension: Secondary | ICD-10-CM | POA: Insufficient documentation

## 2011-08-30 DIAGNOSIS — R51 Headache: Secondary | ICD-10-CM | POA: Insufficient documentation

## 2011-08-30 DIAGNOSIS — K219 Gastro-esophageal reflux disease without esophagitis: Secondary | ICD-10-CM | POA: Insufficient documentation

## 2011-08-30 HISTORY — DX: Major depressive disorder, single episode, unspecified: F32.9

## 2011-08-30 HISTORY — PX: CARPAL TUNNEL RELEASE: SHX101

## 2011-08-30 HISTORY — DX: Depression, unspecified: F32.A

## 2011-08-30 LAB — POCT I-STAT, CHEM 8
Chloride: 102 mEq/L (ref 96–112)
HCT: 35 % — ABNORMAL LOW (ref 36.0–46.0)
Potassium: 4.2 mEq/L (ref 3.5–5.1)

## 2011-08-30 SURGERY — CARPAL TUNNEL RELEASE
Anesthesia: Regional | Site: Wrist | Laterality: Right | Wound class: Clean

## 2011-08-30 MED ORDER — METOCLOPRAMIDE HCL 5 MG/ML IJ SOLN: 10.0000 mg | Freq: Once | INTRAMUSCULAR | Status: DC | PRN

## 2011-08-30 MED ORDER — LACTATED RINGERS IV SOLN
INTRAVENOUS | Status: DC
  Administered 2011-08-30 (×2): via INTRAVENOUS

## 2011-08-30 MED ORDER — FENTANYL CITRATE 0.05 MG/ML IJ SOLN: 50.0000 ug | INTRAMUSCULAR | Status: DC | PRN

## 2011-08-30 MED ORDER — BUPIVACAINE HCL (PF) 0.25 % IJ SOLN
INTRAMUSCULAR | Status: DC | PRN
  Administered 2011-08-30: 6 mL

## 2011-08-30 MED ORDER — FENTANYL CITRATE 0.05 MG/ML IJ SOLN: 25.0000 ug | INTRAMUSCULAR | Status: DC | PRN

## 2011-08-30 MED ORDER — MORPHINE SULFATE 2 MG/ML IJ SOLN: 0.0500 mg/kg | INTRAMUSCULAR | Status: DC | PRN

## 2011-08-30 MED ORDER — CEFAZOLIN SODIUM 1-5 GM-% IV SOLN
1.0000 g | INTRAVENOUS | Status: AC
  Administered 2011-08-30: 2 g via INTRAVENOUS

## 2011-08-30 MED ORDER — MIDAZOLAM HCL 5 MG/5ML IJ SOLN
INTRAMUSCULAR | Status: DC | PRN
  Administered 2011-08-30 (×2): 1 mg via INTRAVENOUS

## 2011-08-30 MED ORDER — ONDANSETRON HCL 4 MG/2ML IJ SOLN
INTRAMUSCULAR | Status: DC | PRN
  Administered 2011-08-30: 4 mg via INTRAVENOUS

## 2011-08-30 MED ORDER — HYDROCODONE-ACETAMINOPHEN 5-500 MG PO TABS: 1.0000 | ORAL_TABLET | Freq: Four times a day (QID) | ORAL | Status: AC | PRN

## 2011-08-30 MED ORDER — LIDOCAINE HCL (PF) 0.5 % IJ SOLN
INTRAMUSCULAR | Status: DC | PRN
  Administered 2011-08-30: 30 mL via INTRATHECAL

## 2011-08-30 MED ORDER — PROPOFOL 10 MG/ML IV BOLUS
INTRAVENOUS | Status: DC | PRN
  Administered 2011-08-30 (×2): 20 mg via INTRAVENOUS

## 2011-08-30 MED ORDER — CHLORHEXIDINE GLUCONATE 4 % EX LIQD: 60.0000 mL | Freq: Once | CUTANEOUS | Status: DC

## 2011-08-30 MED ORDER — MIDAZOLAM HCL 2 MG/2ML IJ SOLN: 0.5000 mg | INTRAMUSCULAR | Status: DC | PRN

## 2011-08-30 MED ORDER — FENTANYL CITRATE 0.05 MG/ML IJ SOLN
INTRAMUSCULAR | Status: DC | PRN
  Administered 2011-08-30 (×2): 50 ug via INTRAVENOUS

## 2011-08-30 SURGICAL SUPPLY — 35 items
BANDAGE GAUZE ELAST BULKY 4 IN (GAUZE/BANDAGES/DRESSINGS) ×2 IMPLANT
BLADE SURG 15 STRL LF DISP TIS (BLADE) ×1 IMPLANT
BLADE SURG 15 STRL SS (BLADE) ×2
BNDG CMPR 9X4 STRL LF SNTH (GAUZE/BANDAGES/DRESSINGS)
BNDG COHESIVE 3X5 TAN STRL LF (GAUZE/BANDAGES/DRESSINGS) ×2 IMPLANT
BNDG ESMARK 4X9 LF (GAUZE/BANDAGES/DRESSINGS) IMPLANT
CHLORAPREP W/TINT 26ML (MISCELLANEOUS) ×2 IMPLANT
CLOTH BEACON ORANGE TIMEOUT ST (SAFETY) ×2 IMPLANT
CORDS BIPOLAR (ELECTRODE) ×2 IMPLANT
COVER MAYO STAND STRL (DRAPES) ×2 IMPLANT
COVER TABLE BACK 60X90 (DRAPES) ×2 IMPLANT
CUFF TOURNIQUET SINGLE 18IN (TOURNIQUET CUFF) ×2 IMPLANT
DRAPE EXTREMITY T 121X128X90 (DRAPE) ×2 IMPLANT
DRAPE SURG 17X23 STRL (DRAPES) ×2 IMPLANT
DRSG KUZMA FLUFF (GAUZE/BANDAGES/DRESSINGS) ×2 IMPLANT
GAUZE XEROFORM 1X8 LF (GAUZE/BANDAGES/DRESSINGS) ×2 IMPLANT
GLOVE BIO SURGEON STRL SZ 6.5 (GLOVE) ×2 IMPLANT
GLOVE SURG ORTHO 8.0 STRL STRW (GLOVE) ×2 IMPLANT
GOWN BRE IMP PREV XXLGXLNG (GOWN DISPOSABLE) ×2 IMPLANT
GOWN PREVENTION PLUS XLARGE (GOWN DISPOSABLE) ×2 IMPLANT
NEEDLE 27GAX1X1/2 (NEEDLE) ×1 IMPLANT
NS IRRIG 1000ML POUR BTL (IV SOLUTION) ×2 IMPLANT
PACK BASIN DAY SURGERY FS (CUSTOM PROCEDURE TRAY) ×2 IMPLANT
PAD CAST 3X4 CTTN HI CHSV (CAST SUPPLIES) ×1 IMPLANT
PADDING CAST ABS 4INX4YD NS (CAST SUPPLIES) ×1
PADDING CAST ABS COTTON 4X4 ST (CAST SUPPLIES) ×1 IMPLANT
PADDING CAST COTTON 3X4 STRL (CAST SUPPLIES) ×2
SPONGE GAUZE 4X4 12PLY (GAUZE/BANDAGES/DRESSINGS) ×2 IMPLANT
STOCKINETTE 4X48 STRL (DRAPES) ×2 IMPLANT
SUT VICRYL 4-0 PS2 18IN ABS (SUTURE) IMPLANT
SUT VICRYL RAPIDE 4/0 PS 2 (SUTURE) ×2 IMPLANT
SYR BULB 3OZ (MISCELLANEOUS) ×2 IMPLANT
SYR CONTROL 10ML LL (SYRINGE) ×1 IMPLANT
TOWEL OR 17X24 6PK STRL BLUE (TOWEL DISPOSABLE) ×2 IMPLANT
UNDERPAD 30X30 INCONTINENT (UNDERPADS AND DIAPERS) ×2 IMPLANT

## 2011-08-30 NOTE — H&P (Signed)
Cynthia Becker is 41 year old right hand dominant female referred by Dr. Pamelia Hoit for a consultation with respect to bilateral hand pain, right greater than left. This has been going on for approximately one year. She has difficulty picking things up and dropping things. She has no history of injury. This awakens her 7 out of 7 nights. She states she does have a disc at C7. No history of diabetes, thyroid problems, arthritis or gout. There is a family history of diabetes and she has been tested. She complains of severe to extremely severe sharp, stabbing type pain with a feeling of swelling, numbness and weakness. She states it is getting worse. Activity and exercise makes this worse. She has been taking Tramadol, Mobic and Gabapentin. She has been wearing splints. She had nerve conductions done on 05-24-11 revealing severe carpal tunnel syndrome bilaterally with a prolonged latency motor of 7.2 on the left and 7.3 on the right.  Past Medical History: She has no allergies. She is on Gabapentin, Cymbalta, Brosprone and Lisinopril. She has had a cholecystectomy.   Family Medical History: Positive for diabetes, otherwise negative.  Social History: She smokes 6 cigarettes a day. She does not drink. She is married.  Review of Systems: Positive for glasses, hoarseness, high BP, asthma, shortness of breath, depression, and anemia, otherwise negative. Cynthia Becker is an 40 y.o. female.   Chief Complaint: CTS RT HPI: seea bove  Past Medical History  Diagnosis Date  . High blood pressure   . Asthma   . GERD (gastroesophageal reflux disease)   . Depression   . Neuromuscular disorder     Past Surgical History  Procedure Date  . Gallbladder surgery 04/04/2010  . Tubal ligation   . Cholecystectomy 10/11    lap choli    Family History  Problem Relation Age of Onset  . Heart disease    . Diabetes    . Hypertension    . Alcohol abuse     Social History:  reports that she has been smoking  Cigarettes.  She has been smoking about .5 packs per day. She does not have any smokeless tobacco history on file. She reports that she drinks alcohol. She reports that she does not use illicit drugs.  Allergies: No Known Allergies  Medications Prior to Admission  Medication Dose Route Frequency Provider Last Rate Last Dose  . ceFAZolin (ANCEF) IVPB 1 g/50 mL premix  1 g Intravenous 60 min Pre-Op Nicki Reaper, MD      . chlorhexidine (HIBICLENS) 4 % liquid 4 application  60 mL Topical Once Nicki Reaper, MD      . fentaNYL (SUBLIMAZE) injection 50-100 mcg  50-100 mcg Intravenous PRN Hart Robinsons, MD      . lactated ringers infusion   Intravenous Continuous Zenon Mayo, MD 20 mL/hr at 08/30/11 1207    . midazolam (VERSED) injection 0.5-2 mg  0.5-2 mg Intravenous PRN Hart Robinsons, MD       Medications Prior to Admission  Medication Sig Dispense Refill  . ALBUTEROL IN Inhale into the lungs as needed.      . busPIRone (BUSPAR) 15 MG tablet Take 15 mg by mouth 3 (three) times daily.      . DULoxetine (CYMBALTA) 60 MG capsule Take 60 mg by mouth daily.      Marland Kitchen gabapentin (NEURONTIN) 300 MG capsule Take 300 mg by mouth 3 (three) times daily. And 2 at bedtime      . ibuprofen (ADVIL,MOTRIN) 600 MG  tablet Take 600 mg by mouth 3 (three) times daily.      Marland Kitchen lisinopril (PRINIVIL,ZESTRIL) 20 MG tablet Take 20 mg by mouth daily.      . meloxicam (MOBIC) 7.5 MG tablet Take 7.5 mg by mouth daily.      . cyclobenzaprine (FLEXERIL) 5 MG tablet Take 5 mg by mouth 2 (two) times daily as needed.      Marland Kitchen TRAMADOL HCL PO Take 1 tablet by mouth 2 (two) times daily.      . Tramadol-Acetaminophen (ULTRACET PO) Take 2 tablets by mouth at bedtime as needed.      . venlafaxine (EFFEXOR XR) 75 MG 24 hr capsule Take 75 mg by mouth 3 (three) times daily.        Results for orders placed during the hospital encounter of 08/30/11 (from the past 48 hour(s))  POCT I-STAT, CHEM 8     Status: Abnormal    Collection Time   08/30/11 12:09 PM      Component Value Range Comment   Sodium 138  135 - 145 (mEq/L)    Potassium 4.2  3.5 - 5.1 (mEq/L)    Chloride 102  96 - 112 (mEq/L)    BUN 16  6 - 23 (mg/dL)    Creatinine, Ser 1.61  0.50 - 1.10 (mg/dL)    Glucose, Bld 98  70 - 99 (mg/dL)    Calcium, Ion 0.96  1.12 - 1.32 (mmol/L)    TCO2 26  0 - 100 (mmol/L)    Hemoglobin 11.9 (*) 12.0 - 15.0 (g/dL)    HCT 04.5 (*) 40.9 - 46.0 (%)     No results found.   Pertinent items are noted in HPI.  Blood pressure 133/89, pulse 83, temperature 98 F (36.7 C), temperature source Oral, resp. rate 20, height 5\' 5"  (1.651 m), weight 141.976 kg (313 lb), last menstrual period 08/23/2011, SpO2 100.00%.  General appearance: alert, cooperative and appears stated age Head: Normocephalic, without obvious abnormality Neck: no adenopathy Resp: clear to auscultation bilaterally Cardio: regular rate and rhythm, S1, S2 normal, no murmur, click, rub or gallop GI: soft, non-tender; bowel sounds normal; no masses,  no organomegaly Extremities: extremities normal, atraumatic, no cyanosis or edema Pulses: 2+ and symmetric Skin: Skin color, texture, turgor normal. No rashes or lesions Neurologic: Grossly normal Incision/Wound: na  Assessment/Plan We have discussed the nerve conductions with her. With the delays we would recommend she seriously consider surgical decompression. The pre, peri and post op course are discussed along with risks and complications.  She is aware there is no guarantee with surgery, possibility of infection, recurrence, injury to arteries, nerves and tendons, incomplete relief of symptoms and dystrophy.  She would like to proceed to have her right carpal tunnel release.   Cynthia Becker R 08/30/2011, 12:56 PM

## 2011-08-30 NOTE — Brief Op Note (Signed)
08/30/2011  2:20 PM  PATIENT:  Cynthia Becker  41 y.o. female  PRE-OPERATIVE DIAGNOSIS:  r.c.t.s.  POST-OPERATIVE DIAGNOSIS:  Right Carpal Tunnel Syndrome  PROCEDURE:  Procedure(s) (LRB): CARPAL TUNNEL RELEASE (Right)  SURGEON:  Surgeon(s) and Role:    * Nicki Reaper, MD - Primary  PHYSICIAN ASSISTANT:   ASSISTANTS: none   ANESTHESIA:   local and regional  EBL:     BLOOD ADMINISTERED:none  DRAINS: none   LOCAL MEDICATIONS USED:  MARCAINE     SPECIMEN:  No Specimen  DISPOSITION OF SPECIMEN:  N/A  COUNTS:  YES  TOURNIQUET:   Total Tourniquet Time Documented: Forearm (Right) - 24 minutes  DICTATION: .Other Dictation: Dictation Number [id number]  PLAN OF CARE: Discharge to home after PACU  PATIENT DISPOSITION:  PACU - hemodynamically stable.

## 2011-08-30 NOTE — Anesthesia Procedure Notes (Signed)
Procedure Name: MAC Date/Time: 08/30/2011 1:46 PM Performed by: Zenia Resides D Pre-anesthesia Checklist: Patient identified, Timeout performed, Emergency Drugs available, Suction available and Patient being monitored Patient Re-evaluated:Patient Re-evaluated prior to inductionOxygen Delivery Method: Simple face mask

## 2011-08-30 NOTE — Transfer of Care (Signed)
Immediate Anesthesia Transfer of Care Note  Patient: Cynthia Becker  Procedure(s) Performed: Procedure(s) (LRB): CARPAL TUNNEL RELEASE (Right)  Patient Location: PACU  Anesthesia Type: MAC and Bier block  Level of Consciousness: awake, alert  and oriented  Airway & Oxygen Therapy: Patient Spontanous Breathing and Patient connected to face mask oxygen  Post-op Assessment: Report given to PACU RN and Post -op Vital signs reviewed and stable  Post vital signs: Reviewed and stable  Complications: No apparent anesthesia complications

## 2011-08-30 NOTE — Op Note (Signed)
Dictated number: [id number]

## 2011-08-30 NOTE — Discharge Instructions (Addendum)
[facility] Center Instructions Hand Surgery  Wound Care: Keep your hand elevated above the level of your heart.  Do not allow it to dangle  by your side.  Keep the dressing dry and do not remove it unless your doctor advises you to do so.  He will usually change it at the time of your post-op visit.  Moving your fingers is advised to stimulate circulation but will depend on the site of your surgery.  If you have a splint applied, your doctor will advise you regarding movement.  Activity: Do not drive or operate machinery today.  Rest today and then you may return to your normal activity and work as indicated by your physician.  Diet:  Drink liquids today or eat a light diet.  You may resume a regular diet tomorrow.    General expectations: Pain for two to three days. Fingers may become slightly swollen.  Call your doctor if any of the following occur: Severe pain not relieved by pain medication. Elevated temperature. Dressing soaked with blood. Inability to move fingers. White or bluish color to fingers.  [facility]  [address] [place], [place] [address] [phone]   Post Anesthesia Home Care Instructions  Activity: Get plenty of rest for the remainder of the day. A responsible adult should stay with you for 24 hours following the procedure.  For the next 24 hours, DO NOT: -Drive a car -Operate machinery -Drink alcoholic beverages -Take any medication unless instructed by your physician -Make any legal decisions or sign important papers.  Meals: Start with liquid foods such as gelatin or soup. Progress to regular foods as tolerated. Avoid greasy, spicy, heavy foods. If nausea and/or vomiting occur, drink only clear liquids until the nausea and/or vomiting subsides. Call your physician if vomiting continues.  Special Instructions/Symptoms: Your throat may feel dry or sore from the anesthesia or the breathing tube placed in your throat during surgery. If  this causes discomfort, gargle with warm salt water. The discomfort should disappear within 24 hours.    Regional Anesthesia Blocks  1. Numbness or the inability to move the "blocked" extremity may last from 3-48 hours after placement. The length of time depends on the medication injected and your individual response to the medication. If the numbness is not going away after 48 hours, call your surgeon.  2. The extremity that is blocked will need to be protected until the numbness is gone and the  Strength has returned. Because you cannot feel it, you will need to take extra care to avoid injury. Because it may be weak, you may have difficulty moving it or using it. You may not know what position it is in without looking at it while the block is in effect.  3. For blocks in the legs and feet, returning to weight bearing and walking needs to be done carefully. You will need to wait until the numbness is entirely gone and the strength has returned. You should be able to move your leg and foot normally before you try and bear weight or walk. You will need someone to be with you when you first try to ensure you do not fall and possibly risk injury.  4. Bruising and tenderness at the needle site are common side effects and will resolve in a few days.  5. Persistent numbness or new problems with movement should be communicated to the surgeon or the [facility] ([phone]).  

## 2011-08-30 NOTE — Anesthesia Postprocedure Evaluation (Signed)
Anesthesia Post Note  Patient: Cynthia Becker  Procedure(s) Performed: Procedure(s) (LRB): CARPAL TUNNEL RELEASE (Right)  Anesthesia type: MAC  Patient location: PACU  Post pain: Pain level controlled  Post assessment: Patient's Cardiovascular Status Stable  Last Vitals:  Filed Vitals:   08/30/11 1445  BP:   Pulse: 85  Temp:   Resp: 17    Post vital signs: Reviewed and stable  Level of consciousness: alert  Complications: No apparent anesthesia complications

## 2011-08-30 NOTE — Anesthesia Preprocedure Evaluation (Signed)
Anesthesia Evaluation  Patient identified by MRN, date of birth, ID band Patient awake    Reviewed: Allergy & Precautions, H&P , NPO status , Patient's Chart, lab work & pertinent test results, reviewed documented beta blocker date and time   Airway Mallampati: II TM Distance: >3 FB Neck ROM: full    Dental   Pulmonary asthma ,          Cardiovascular hypertension, On Medications     Neuro/Psych  Headaches,  Neuromuscular disease negative psych ROS   GI/Hepatic negative GI ROS, Neg liver ROS, GERD-  Medicated and Controlled,  Endo/Other  Morbid obesity  Renal/GU negative Renal ROS  negative genitourinary   Musculoskeletal   Abdominal   Peds  Hematology negative hematology ROS (+)   Anesthesia Other Findings See surgeon's H&P   Reproductive/Obstetrics negative OB ROS                           Anesthesia Physical Anesthesia Plan  ASA: III  Anesthesia Plan: MAC and Bier Block   Post-op Pain Management:    Induction: Intravenous  Airway Management Planned: Simple Face Mask  Additional Equipment:   Intra-op Plan:   Post-operative Plan:   Informed Consent: I have reviewed the patients History and Physical, chart, labs and discussed the procedure including the risks, benefits and alternatives for the proposed anesthesia with the patient or authorized representative who has indicated his/her understanding and acceptance.     Plan Discussed with: CRNA and Surgeon  Anesthesia Plan Comments:         Anesthesia Quick Evaluation

## 2011-09-01 NOTE — Op Note (Signed)
NAME:  Cynthia, Bergevin Becker                    ACCOUNT NO.:  MEDICAL RECORD NO.:  [id number]  LOCATION:                                 FACILITY:  PHYSICIAN:  Cindee Salt, M.D.       DATE OF BIRTH:  May 12, 1971  DATE OF PROCEDURE:  08/30/2011 DATE OF DISCHARGE:                              OPERATIVE REPORT   PREOPERATIVE DIAGNOSIS:  Carpal tunnel syndrome, right hand.  POSTOPERATIVE DIAGNOSIS:  Carpal tunnel syndrome, right hand.  OPERATION:  Decompression of right median nerve.  SURGEON:  Cindee Salt, MD  ANESTHESIA:  Forearm based IV regional with local infiltration.  ANESTHESIOLOGIST:  Janetta Hora. Gelene Mink, MD  HISTORY:  The patient is a 41 year old female with a history of carpal tunnel syndrome.  EMG nerve conduction is positive.  This is not responded to conservative treatment.  She has elected to undergo surgical decompression.  Pre, peri, and postoperative course are discussed along with risks and complications.  She is aware that there is no guarantee with the surgery, possibility of infection, recurrence of injury to arteries, nerves, tendons, incomplete relief of symptoms, and dystrophy.  In the preoperative area, the patient is seen, the extremity marked by both the patient and surgeon, and antibiotic given.  PROCEDURE:  The patient was brought to the operating room where a forearm based IV regional anesthetic was carried out without difficulty. She was prepped using ChloraPrep, supine position, right arm free.  A 3- minute dry time was allowed, time-out taken, confirming the patient and procedure.  A longitudinal incision was made in the palm, carried down through the subcutaneous tissue.  Bleeders were electrocauterized. Palmar fascia was split.  Superficial palmar arch was identified.  The flexor tendon to the ring and little finger identified to the ulnar side of the median nerve.  The carpal retinaculum was incised with sharp dissection.  Right angle and Sewall  retractor were placed between the skin and forearm fascia.  The fascia was released for approximately 1.5 cm proximal to the wrist crease under direct vision.  Canal was explored.  No further lesions were identified.  The area of compression to the nerve was grossly apparent with an hourglass deformity and very significant hyperemia was present.  The wound was irrigated.  The skin was closed with interrupted 4-0 Vicryl Rapide sutures.  At the initiation of the procedure, the area of incision was injected with Marcaine 0.25% without epinephrine, approximately 6 mL was used.  A sterile compressive dressing was applied with the fingers free.  On deflation of the tourniquet, all fingers were immediately pinked, she was taken to the recovery room for observation in satisfactory condition.          ______________________________ Cindee Salt, M.D.     GK/MEDQ  D:  08/30/2011  T:  08/31/2011  Job:  [id number]

## 2011-09-03 ENCOUNTER — Encounter (HOSPITAL_BASED_OUTPATIENT_CLINIC_OR_DEPARTMENT_OTHER): Payer: Self-pay | Admitting: Orthopedic Surgery

## 2011-09-18 ENCOUNTER — Encounter: Payer: Self-pay | Admitting: Physical Medicine and Rehabilitation

## 2011-09-18 ENCOUNTER — Encounter: Payer: Self-pay | Attending: Physical Medicine and Rehabilitation | Admitting: Physical Medicine and Rehabilitation

## 2011-09-18 VITALS — BP 112/54 | HR 90 | Resp 16 | Ht 65.0 in | Wt 320.0 lb

## 2011-09-18 DIAGNOSIS — G5600 Carpal tunnel syndrome, unspecified upper limb: Secondary | ICD-10-CM | POA: Insufficient documentation

## 2011-09-18 DIAGNOSIS — M542 Cervicalgia: Secondary | ICD-10-CM | POA: Insufficient documentation

## 2011-09-18 DIAGNOSIS — R209 Unspecified disturbances of skin sensation: Secondary | ICD-10-CM | POA: Insufficient documentation

## 2011-09-18 DIAGNOSIS — M79642 Pain in left hand: Secondary | ICD-10-CM | POA: Insufficient documentation

## 2011-09-18 DIAGNOSIS — M545 Low back pain, unspecified: Secondary | ICD-10-CM | POA: Insufficient documentation

## 2011-09-18 DIAGNOSIS — M79609 Pain in unspecified limb: Secondary | ICD-10-CM | POA: Insufficient documentation

## 2011-09-18 NOTE — Progress Notes (Signed)
Subjective:    Patient ID: Cynthia Becker, female    DOB: 12/18/1970, 41 y.o.   MRN: [id number]  HPI Cynthia Becker is a pleasant married African American woman who is  referred by Dr. Yisroel Ramming.  Cynthia Becker presents with a chief complaint of chronic neck pain as well  as bilateral hand numbness and tingling which radiates to the forearm  nocturnally.  This has been going on for several months now. She has had an MRI of  her cervical spine. January 30, 2011, which showed a mild central  stenosis at C6-7 and mild foraminal narrowing at C7-T1. Cervical  spondylosis and degenerative disk disease wit no significant evidence of  abnormal cord signal identified.  She also has a history of some low back pain and has L5-S1 degenerative  disk disease.  In addition, she notes poor sleep. She is not sure what aggravates her  pain. She has been using tramadol and gabapentin, and is getting a  little relief with it.   The patient tells me she's had recent carpal tunnel surgery on the right hand per Dr. Merlyn Lot on 08/30/2011.  She is still seeing Dr. Merlyn Lot in followup she has an appointment October 17, 2011.  It is complaining about a sense of extra fluid build up in her head and chest and back, basically she states it feels like fluid is built up all over. Have asked her to followup with primary care for these type of complaints.  Overall regarding her pain complaints she's doing very well. Her biggest issue right now is her her left hand. She is waiting for carpal tunnel surgery on this wrist.  Pain scores are 4 on a scale of 10 and this is mainly because of the left wrist.  The rest of her complaints at this time are minimal and she tells me that she does not need management of any of these other problems currently  Pain Inventory Average Pain 4 Pain Right Now 4 My pain is sharp  In the last 24 hours, has pain interfered with the following? General activity 4 Relation with others  4 Enjoyment of life 3 What TIME of day is your pain at its worst? evening Sleep (in general) Fair  Pain is worse with: unsure Pain improves with: rest Relief from Meds: 2  Mobility how many minutes can you walk? 10 ability to climb steps?  yes do you drive?  yes  Function not employed: date last employed   Neuro/Psych No problems in this area  Prior Studies Any changes since last visit?  no  Physicians involved in your care Orthopedist Dr. Merlyn Lot  Review of Systems  Constitutional: Positive for unexpected weight change.  HENT: Negative.   Eyes: Negative.   Respiratory: Positive for cough and wheezing.   Cardiovascular: Negative.   Gastrointestinal: Positive for constipation.  Genitourinary: Negative.   Musculoskeletal: Negative.   Skin: Negative.   Neurological: Negative.   Hematological: Negative.   Psychiatric/Behavioral: Negative.        Objective:   Physical Exam  Bilateral hands are examined.  Right hand as a well-healed scar over the ventral surface of the wrist. Nontender no erythema no drainage is appreciated  She has some mild sensory deficits at the thumb index and middle finger yet. These are not new.  Motor strength is good in hand.  Left hand is notable for sensory deficit in median distribution as well. Positive Tinel over  left median nerve  Assessment & Plan:  1. Cervicalgia. Last MRI on January 30, 2011, of the cervical spine,  mild central stenosis at C6-7, mild foraminal narrowing at C7-T1,  degenerative disk disease and cervical spondylosis.  stable pain is not a problem at this time   2. Bilateral shoulder pain. resolved      3. Bilateral hand pain and numbness nocturnally. Pt is status post right CTS surgery.  4. Chronic low back pain.  currently not a problem    . Overall regarding her pain complaints she's doing very well. Her biggest issue right now is her her left hand. She is waiting for carpal tunnel surgery on this  wrist.  Pain scores are 4 on a scale of 10 and this is mainly because of the left wrist.  The rest of her complaints at this time are minimal and she tells me that she does not need management of any of these other problems currently.   This point she's doing very well and I've asked her to followup with [place] on a when necessary basis.

## 2011-09-18 NOTE — Patient Instructions (Signed)
Followup on a when necessary basis here the center for pain and rehabilitative medicine.  Make sure you keep her appointments with your hand surgeon in the upcoming months

## 2011-09-19 ENCOUNTER — Encounter: Payer: Self-pay | Admitting: Physical Medicine and Rehabilitation

## 2011-09-27 ENCOUNTER — Ambulatory Visit (HOSPITAL_COMMUNITY)
Admission: RE | Admit: 2011-09-27 | Discharge: 2011-09-27 | Disposition: A | Discharge status: Home / Self Care | Payer: Self-pay | Source: Ambulatory Visit | Attending: Internal Medicine | Admitting: Internal Medicine

## 2011-09-27 ENCOUNTER — Other Ambulatory Visit (HOSPITAL_COMMUNITY): Payer: Self-pay | Admitting: Internal Medicine

## 2011-09-27 DIAGNOSIS — R0602 Shortness of breath: Secondary | ICD-10-CM | POA: Insufficient documentation

## 2011-09-27 DIAGNOSIS — R05 Cough: Secondary | ICD-10-CM | POA: Insufficient documentation

## 2011-09-27 DIAGNOSIS — R059 Cough, unspecified: Secondary | ICD-10-CM | POA: Insufficient documentation

## 2011-10-01 ENCOUNTER — Encounter: Payer: Self-pay | Admitting: Physical Medicine and Rehabilitation

## 2011-11-19 ENCOUNTER — Ambulatory Visit: Payer: Self-pay | Attending: Internal Medicine | Admitting: Physical Therapy

## 2011-11-19 DIAGNOSIS — M2569 Stiffness of other specified joint, not elsewhere classified: Secondary | ICD-10-CM | POA: Insufficient documentation

## 2011-11-19 DIAGNOSIS — M545 Low back pain, unspecified: Secondary | ICD-10-CM | POA: Insufficient documentation

## 2011-11-19 DIAGNOSIS — IMO0001 Reserved for inherently not codable concepts without codable children: Secondary | ICD-10-CM | POA: Insufficient documentation

## 2011-11-26 ENCOUNTER — Ambulatory Visit: Payer: Self-pay | Attending: Internal Medicine | Admitting: Physical Therapy

## 2011-11-26 DIAGNOSIS — M2569 Stiffness of other specified joint, not elsewhere classified: Secondary | ICD-10-CM | POA: Insufficient documentation

## 2011-11-26 DIAGNOSIS — M545 Low back pain, unspecified: Secondary | ICD-10-CM | POA: Insufficient documentation

## 2011-11-26 DIAGNOSIS — IMO0001 Reserved for inherently not codable concepts without codable children: Secondary | ICD-10-CM | POA: Insufficient documentation

## 2011-11-28 ENCOUNTER — Encounter: Payer: Self-pay | Admitting: Physical Medicine and Rehabilitation

## 2011-11-28 ENCOUNTER — Encounter: Payer: Self-pay | Attending: Physical Medicine and Rehabilitation | Admitting: Physical Medicine and Rehabilitation

## 2011-11-28 VITALS — BP 164/73 | HR 118 | Resp 18 | Ht 65.0 in | Wt 328.2 lb

## 2011-11-28 DIAGNOSIS — M545 Low back pain, unspecified: Secondary | ICD-10-CM | POA: Insufficient documentation

## 2011-11-28 DIAGNOSIS — M25559 Pain in unspecified hip: Secondary | ICD-10-CM | POA: Insufficient documentation

## 2011-11-28 DIAGNOSIS — M5136 Other intervertebral disc degeneration, lumbar region: Secondary | ICD-10-CM

## 2011-11-28 DIAGNOSIS — M4802 Spinal stenosis, cervical region: Secondary | ICD-10-CM | POA: Insufficient documentation

## 2011-11-28 DIAGNOSIS — M79642 Pain in left hand: Secondary | ICD-10-CM

## 2011-11-28 DIAGNOSIS — M5137 Other intervertebral disc degeneration, lumbosacral region: Secondary | ICD-10-CM

## 2011-11-28 DIAGNOSIS — M79609 Pain in unspecified limb: Secondary | ICD-10-CM

## 2011-11-28 DIAGNOSIS — M51379 Other intervertebral disc degeneration, lumbosacral region without mention of lumbar back pain or lower extremity pain: Secondary | ICD-10-CM | POA: Insufficient documentation

## 2011-11-28 DIAGNOSIS — M542 Cervicalgia: Secondary | ICD-10-CM

## 2011-11-28 DIAGNOSIS — G5600 Carpal tunnel syndrome, unspecified upper limb: Secondary | ICD-10-CM | POA: Insufficient documentation

## 2011-11-28 MED ORDER — METHYLPREDNISOLONE 4 MG PO KIT: PACK | ORAL | Status: AC

## 2011-11-28 MED ORDER — CYCLOBENZAPRINE HCL 5 MG PO TABS: 5.0000 mg | ORAL_TABLET | Freq: Three times a day (TID) | ORAL | Status: DC

## 2011-11-28 NOTE — Patient Instructions (Signed)
Continue with PT, also advised patient to do some exercises in the pool. Continue with medication.

## 2011-11-28 NOTE — Progress Notes (Signed)
Subjective:    Patient ID: Cynthia Becker, female    DOB: 1971-02-01, 41 y.o.   MRN: [id number]  HPI The patient is a 41 year old female , who presents with an exacerbation of her LBP . The symptoms started 2 weeks ago. The patient complains about moderate pain   , which radiates to her posterior hips bilateral.  She describes the pain as dull and achy   . Applying heat,  changing positions alleviate the symptoms. Prolonged standing, cleaning aggrevates the symptoms. The patient grades her pain as a 7 /10. The patient reports that she has OCD, and she vacuums and mobs her floor 3-5 times every day. The patient also reports that she is doing physical therapy, but at this point she is not doing a lot of exercises, she is more using modalities for pain relief. Pain Inventory Average Pain 8 Pain Right Now 7 My pain is constant, stabbing and aching  In the last 24 hours, has pain interfered with the following? General activity 2 Relation with others 3 Enjoyment of life 2 What TIME of day is your pain at its worst? evening Sleep (in general) Fair  Pain is worse with: walking and standing Pain improves with: medication and injections Relief from Meds: 5  Mobility walk without assistance how many minutes can you walk? 3  Function not employed: date last employed   Neuro/Psych numbness depression anxiety  Prior Studies Any changes since last visit?  no  Physicians involved in your care Any changes since last visit?  no   Family History  Problem Relation Age of Onset  . Heart disease    . Diabetes    . Hypertension    . Alcohol abuse    . Diabetes Mother   . Hypertension Father    History   Social History  . Marital Status: Married    Spouse Name: N/A    Number of Children: N/A  . Years of Education: N/A   Social History Main Topics  . Smoking status: Current Everyday Smoker -- 0.5 packs/day    Types: Cigarettes  . Smokeless tobacco: Never Used  . Alcohol Use: 0.0  oz/week     rare  . Drug Use: No  . Sexually Active: None   Other Topics Concern  . None   Social History Narrative  . None   Past Surgical History  Procedure Date  . Gallbladder surgery 04/04/2010  . Tubal ligation   . Cholecystectomy 10/11    lap choli  . Carpal tunnel release 08/30/2011    Procedure: CARPAL TUNNEL RELEASE;  Surgeon: Nicki Reaper, MD;  Location: [facility];  Service: Orthopedics;  Laterality: Right;   Past Medical History  Diagnosis Date  . High blood pressure   . Asthma   . GERD (gastroesophageal reflux disease)   . Depression   . Neuromuscular disorder    BP 164/73  Pulse 118  Resp 18  Ht 5\' 5"  (1.651 m)  Wt 328 lb 3.2 oz (148.871 kg)  BMI 54.62 kg/m2  SpO2 99%    Review of Systems  Constitutional: Positive for unexpected weight change.  Musculoskeletal: Positive for back pain.  Neurological: Positive for numbness.  Psychiatric/Behavioral: Positive for dysphoric mood. The patient is nervous/anxious.   All other systems reviewed and are negative.       Objective:   Physical Exam  Constitutional: She is oriented to person, place, and time. She appears well-developed and well-nourished.  Patient is morbidly obese  HENT:  Head: Normocephalic.  Neck: Neck supple.  Musculoskeletal: She exhibits tenderness.  Neurological: She is alert and oriented to person, place, and time.  Skin: Skin is warm and dry.  Psychiatric: She has a normal mood and affect.     Symmetric normal motor tone is noted throughout. Normal muscle bulk. Muscle testing reveals 5/5 muscle strength of the upper extremity, and 5/5 of the lower extremity, except right adductor polices, 3+/5 s/p carpal tunnel surgery. Full range of motion in upper and lower extremities. ROM of spine is mildly restricted.   DTR in the upper and lower extremity are present and symmetric 2+. No clonus is noted.  Patient arises from chair without difficulty. Wide based gait with  normal arm swing bilateral , able to walk on heels and toes, with some pain in her LB . Tandem walk is stable.  Tenderness at L4-5 on palpation, and in right > than left buttocks.       Assessment & Plan:  This is a 41 year old female with 1. Carpal tunnel syndrome bilateral, status post carpal tunnel surgery in March 2013 2. Degenerative disc disease in lumbar spine, worse at L5-S1 3. Low back pain, radiating into her posterior hips bilateral 4. 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 Also advised patient to talk to her counselor about OCD, and how this affects her low back pain, when she vacuums and mobs the floor 3-5 times a day. Prescribed a steroid dose pack today, for the exacerbation of her low back pain. Also refilled her Flexeril. Follow up in 1 month.

## 2011-12-02 ENCOUNTER — Encounter: Payer: Self-pay | Admitting: Physical Medicine and Rehabilitation

## 2011-12-04 ENCOUNTER — Ambulatory Visit: Payer: Self-pay | Admitting: Physical Therapy

## 2011-12-05 ENCOUNTER — Ambulatory Visit: Payer: Self-pay | Admitting: Physical Therapy

## 2011-12-11 ENCOUNTER — Ambulatory Visit: Payer: Self-pay | Admitting: Physical Therapy

## 2011-12-17 ENCOUNTER — Ambulatory Visit: Payer: Self-pay | Admitting: Physical Therapy

## 2011-12-19 ENCOUNTER — Ambulatory Visit: Payer: Self-pay | Admitting: Physical Therapy

## 2011-12-30 ENCOUNTER — Encounter: Payer: Self-pay | Attending: Physical Medicine and Rehabilitation | Admitting: Physical Medicine and Rehabilitation

## 2011-12-30 ENCOUNTER — Encounter: Payer: Self-pay | Admitting: Physical Medicine and Rehabilitation

## 2011-12-30 VITALS — BP 133/59 | HR 105 | Resp 16 | Ht 65.0 in | Wt 334.4 lb

## 2011-12-30 DIAGNOSIS — M545 Low back pain, unspecified: Secondary | ICD-10-CM | POA: Insufficient documentation

## 2011-12-30 DIAGNOSIS — M79609 Pain in unspecified limb: Secondary | ICD-10-CM | POA: Insufficient documentation

## 2011-12-30 MED ORDER — IBUPROFEN 800 MG PO TABS: 800.0000 mg | ORAL_TABLET | Freq: Three times a day (TID) | ORAL | Status: AC | PRN

## 2011-12-30 MED ORDER — CYCLOBENZAPRINE HCL 5 MG PO TABS: 5.0000 mg | ORAL_TABLET | Freq: Three times a day (TID) | ORAL | Status: DC

## 2011-12-30 NOTE — Progress Notes (Signed)
Subjective:    Patient ID: Cynthia Becker, female    DOB: [birth year], 41 y.o.   MRN: [id number]  HPI The patient is a 41 year old female , who presents with an exacerbation of her LBP . The symptoms started around Mother's day. The patient complains about moderate pain , which radiates to her posterior hips bilateral, and into her right posterior thigh. She describes the pain as dull and achy . Applying heat, changing positions alleviate the symptoms. Prolonged standing, cleaning aggrevates the symptoms. The patient grades her pain as a 7 /10. The patient reports that she has OCD, and she vacuums and mobs her floor 3-5 times every day. The patient also reports that she is doing physical therapy, but at this point she is not doing a lot of exercises, she is more using modalities for pain relief. The patient also complains about having a lot of stress in her house, with having to care for her disabled mother and several adult children.  Pain Inventory Average Pain 8 Pain Right Now 8 My pain is burning and aching  In the last 24 hours, has pain interfered with the following? General activity 9 Relation with others 5 Enjoyment of life 1 What TIME of day is your pain at its worst? evening Sleep (in general) Fair  Pain is worse with: walking and standing Pain improves with: medication and injections Relief from Meds: 5  Mobility how many minutes can you walk? 2  Function not employed: date last employed   Neuro/Psych trouble walking spasms  Prior Studies Any changes since last visit?  no  Physicians involved in your care Any changes since last visit?  no   Family History  Problem Relation Age of Onset  . Heart disease    . Diabetes    . Hypertension    . Alcohol abuse    . Diabetes Mother   . Hypertension Father    History   Social History  . Marital Status: Married    Spouse Name: N/A    Number of Children: N/A  . Years of Education: N/A   Social History Main  Topics  . Smoking status: Current Everyday Smoker -- 0.5 packs/day    Types: Cigarettes  . Smokeless tobacco: Never Used  . Alcohol Use: 0.0 oz/week     rare  . Drug Use: No  . Sexually Active: None   Other Topics Concern  . None   Social History Narrative  . None   Past Surgical History  Procedure Date  . Gallbladder surgery 04/04/2010  . Tubal ligation   . Cholecystectomy 10/11    lap choli  . Carpal tunnel release 08/30/2011    Procedure: CARPAL TUNNEL RELEASE;  Surgeon: Nicki Reaper, MD;  Location: [facility];  Service: Orthopedics;  Laterality: Right;   Past Medical History  Diagnosis Date  . High blood pressure   . Asthma   . GERD (gastroesophageal reflux disease)   . Depression   . Neuromuscular disorder    BP 133/59  Pulse 105  Resp 16  Ht 5\' 5"  (1.651 m)  Wt 334 lb 6.4 oz (151.683 kg)  BMI 55.65 kg/m2  SpO2 97%    Review of Systems  Musculoskeletal: Positive for gait problem.       Spasms  All other systems reviewed and are negative.       Objective:   Physical Exam Constitutional: She is oriented to person, place, and time. She appears well-developed and well-nourished.  Patient is morbidly obese  HENT:  Head: Normocephalic.  Neck: Neck supple.  Musculoskeletal: She exhibits tenderness.  Neurological: She is alert and oriented to person, place, and time.  Skin: Skin is warm and dry.  Psychiatric: She has a normal mood and affect.   Symmetric normal motor tone is noted throughout. Normal muscle bulk. Muscle testing reveals 5/5 muscle strength of the upper extremity, and 5/5 of the lower extremity, except right adductor polices, 3+/5 s/p carpal tunnel surgery. Full range of motion in upper and lower extremities. ROM of spine is mildly restricted.  DTR in the upper and lower extremity are present and symmetric 2+. No clonus is noted.  Patient arises from chair without difficulty. Wide based gait with normal arm swing bilateral , able  to walk on heels and toes, with some pain in her LB . Tandem walk is stable.  Tenderness at L4-5 on palpation, and in right > than left buttocks.         Assessment & Plan:  This is a 41 year old female with  1. Carpal tunnel syndrome bilateral, status post carpal tunnel surgery in March 2013  2. Degenerative disc disease in lumbar spine, worse at L5-S1  3. Low back pain, radiating into her posterior hips bilateral  4. 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 Also advised patient to talk to her counselor about OCD, and how this affects her low back pain, when she vacuums and mobs the floor 3-5 times a day. Prescribed a steroid dose pack , for the exacerbation of her low back pain , at her last visit. She reports, that this has not helped her much. Also refilled her Flexeril. She states, that the flexeril is helping some. Prescribed Ibuprofen 800mg  bid, which has helped her in the past. Ordered MRI of L-spine, consider injections after getting the results. She had good relief from injections in the past. Last MRI showed protrusion of L5-S1 disc with slight impingement of L5 nerve on the right. Followup in one month.

## 2011-12-30 NOTE — Patient Instructions (Signed)
Continue with PT, try to do some stretches at home.

## 2012-01-04 ENCOUNTER — Ambulatory Visit
Admission: RE | Admit: 2012-01-04 | Discharge: 2012-01-04 | Disposition: A | Discharge status: Home / Self Care | Payer: Self-pay | Source: Ambulatory Visit | Attending: Physical Medicine and Rehabilitation | Admitting: Physical Medicine and Rehabilitation

## 2012-01-04 DIAGNOSIS — M545 Low back pain: Secondary | ICD-10-CM

## 2012-01-24 ENCOUNTER — Encounter: Payer: Self-pay | Admitting: Physical Medicine and Rehabilitation

## 2012-01-24 ENCOUNTER — Encounter: Payer: Self-pay | Attending: Physical Medicine and Rehabilitation | Admitting: Physical Medicine and Rehabilitation

## 2012-01-24 VITALS — BP 140/88 | HR 100 | Resp 16 | Ht 62.0 in | Wt 331.0 lb

## 2012-01-24 DIAGNOSIS — M5126 Other intervertebral disc displacement, lumbar region: Secondary | ICD-10-CM

## 2012-01-24 DIAGNOSIS — M545 Low back pain, unspecified: Secondary | ICD-10-CM | POA: Insufficient documentation

## 2012-01-24 DIAGNOSIS — G5600 Carpal tunnel syndrome, unspecified upper limb: Secondary | ICD-10-CM | POA: Insufficient documentation

## 2012-01-24 DIAGNOSIS — M25559 Pain in unspecified hip: Secondary | ICD-10-CM | POA: Insufficient documentation

## 2012-01-24 DIAGNOSIS — M51379 Other intervertebral disc degeneration, lumbosacral region without mention of lumbar back pain or lower extremity pain: Secondary | ICD-10-CM | POA: Insufficient documentation

## 2012-01-24 DIAGNOSIS — M5137 Other intervertebral disc degeneration, lumbosacral region: Secondary | ICD-10-CM | POA: Insufficient documentation

## 2012-01-24 DIAGNOSIS — M4802 Spinal stenosis, cervical region: Secondary | ICD-10-CM | POA: Insufficient documentation

## 2012-01-24 MED ORDER — PREGABALIN 50 MG PO CAPS: 50.0000 mg | ORAL_CAPSULE | Freq: Three times a day (TID) | ORAL | Status: DC

## 2012-01-24 MED ORDER — NAPROXEN 500 MG PO TABS: 500.0000 mg | ORAL_TABLET | Freq: Two times a day (BID) | ORAL | Status: DC

## 2012-01-24 NOTE — Progress Notes (Deleted)
  Subjective:    Patient ID: Cynthia Becker, female    DOB: Aug 13, 1970, 41 y.o.   MRN: [id number]  HPI    Review of Systems     Objective:   Physical Exam        Assessment & Plan:

## 2012-01-24 NOTE — Patient Instructions (Signed)
I have reviewed your lumbar MRI with you today.  You have requested we focus our visit today on your low back pain  I would like you to finish up with her physical therapy program.  Use proper body mechanics and posture during her daily activities.  I discontinued your ibuprofen and trying you on Naprosyn. I have reviewed risks and benefits of this medicine with you.  I am discontinuing gabapentin and trying you on Lyrica.  I have given you send information regarding medial branch blocks for the lumbar spine.  I will see you back in one month

## 2012-01-24 NOTE — Progress Notes (Signed)
Subjective:    Patient ID: Cynthia Becker, female    DOB: 1970-07-04, 41 y.o.   MRN: [id number]  HPI Ms. Shakyla Nolley is a pleasant married African American woman who is  referred by Dr. Yisroel Ramming.  Ms. Ennis has a history of chronic neck pain as well  as bilateral hand numbness and tingling which radiates to the forearm  nocturnally.   MRI of  her cervical spine. January 30, 2011, which showed a mild central  stenosis at C6-7 and mild foraminal narrowing at C7-T1. Cervical  spondylosis and degenerative disk disease with no significant evidence of  abnormal cord signal identified. This problem had been stable.  In the last week of May she had an exacerbation of low back pain. She also has a history of some low back pain and has L5-S1 degenerative  disk disease.   12/30/2011 Lumbar MRI  RADIOLOGY REPORT*  Clinical Data: Low back pain extending into the right thigh for 2.5  years. No recent injury or prior relevant surgery.  MRI LUMBAR SPINE WITHOUT CONTRAST  Technique: Multiplanar and multiecho pulse sequences of the lumbar  spine were obtained without intravenous contrast.  Comparison: Lumbar MRI 11/01/2009.  Findings: Detail remains mildly limited by body habitus. Five  lumbar type vertebral bodies are again assumed. The lumbar  alignment is normal. There is no evidence of fracture. There is  no definite pars defect. Schmorl's node formation at L5-S1 appears  stable.  The conus medullaris extends to the L2 level and appears normal.  There are no paraspinal abnormalities. The uterus is prominent  with mild myometrial heterogeneity.  There are no significant disc space findings from T11-T12 through  L3-L4. L4-L5: Disc height and hydration are maintained. There is stable  mild bilateral facet hypertrophy. No spinal stenosis or nerve root  encroachment.  L5-S1: There is stable annular disc bulging eccentric to the  right. Mild facet and ligamentous hypertrophy is present.  The  central canal and lateral recesses are patent. There is unchanged  right greater than left foraminal stenosis with possible chronic  right L5 nerve root encroachment.  IMPRESSION:  1. Compared with the prior study from 2011, no significant changes  are identified.  2. Chronic degenerative disc disease at L5-S1 with asymmetric disc  bulging towards the right. The resulting right greater than left  foraminal stenosis and possible chronic right L5 nerve root  encroachment are unchanged.  3. Stable mild facet disease at L4-L5 and L5-S1.  Original Report Authenticated By: Gerrianne Scale, M.D.   She has been using tramadol and gabapentin, and is getting a  little relief with it.  The patient tells me she's had recent carpal tunnel surgery on the right hand per Dr. Merlyn Lot on 08/30/2011.   At last visit was advised to: Continue with physical therapy, advised patient to do some exercises in the pool, to built up her strength and also to loose some weight. Also advised patient to talk to her counselor about OCD, and how this affects her low back pain, when she vacuums and mobs the floor 3-5 times a day.   Prescribed a steroid dose pack , for the exacerbation of her low back pain , at her last visit. She reports, that this has not helped her much.   Also refilled her Flexeril. She states, that the flexeril is helping some. Prescribed Ibuprofen 800mg  bid, which has helped her in the past.   Consider injections after getting the results. She had good relief from  injections in the past. Last MRI showed protrusion of L5-S1 disc with slight impingement of L5 nerve on the right. Followup in one month.  The focus of today's visit on requested by the patient is her low back pain. She reports previous problems with her low back however she had an exacerbation mid- May. She's not sure exactly what brought it on but she woke up with low back pain. Pain is described as hard and burning localized to  posterior hip and low lumbar region. It radiates in the left lower extremity down the back of her left thigh approximately halfway.  Pain is exacerbated by standing and walking. Improves a sitting.  Pain Inventory Average Pain 8 Pain Right Now 8 My pain is tingling and aching  In the last 24 hours, has pain interfered with the following? General activity 1 Relation with others 1 Enjoyment of life 1 What TIME of day is your pain at its worst? Daytime and Evening Sleep (in general) Fair  Pain is worse with: walking and standing Pain improves with: medication Relief from Meds: 2  Mobility walk without assistance  Function not employed: date last employed   Neuro/Psych depression  Prior Studies Any changes since last visit?  no  Physicians involved in your care Any changes since last visit?  no   Family History  Problem Relation Age of Onset  . Heart disease    . Diabetes    . Hypertension    . Alcohol abuse    . Diabetes Mother   . Hypertension Father    History   Social History  . Marital Status: Married    Spouse Name: N/A    Number of Children: N/A  . Years of Education: N/A   Social History Main Topics  . Smoking status: Current Everyday Smoker -- 0.5 packs/day    Types: Cigarettes  . Smokeless tobacco: Never Used  . Alcohol Use: 0.0 oz/week     rare  . Drug Use: No  . Sexually Active: None   Other Topics Concern  . None   Social History Narrative  . None   Past Surgical History  Procedure Date  . Gallbladder surgery 04/04/2010  . Tubal ligation   . Cholecystectomy 10/11    lap choli  . Carpal tunnel release 08/30/2011    Procedure: CARPAL TUNNEL RELEASE;  Surgeon: Nicki Reaper, MD;  Location: [facility];  Service: Orthopedics;  Laterality: Right;   Past Medical History  Diagnosis Date  . High blood pressure   . Asthma   . GERD (gastroesophageal reflux disease)   . Depression   . Neuromuscular disorder    BP 165/104   Pulse 100  Resp 16  Ht 5\' 2"  (1.575 m)  Wt 331 lb (150.141 kg)  BMI 60.54 kg/m2  SpO2 99%     Review of Systems  Constitutional: Positive for diaphoresis.  HENT: Negative.   Eyes: Negative.   Respiratory: Negative.   Cardiovascular: Negative.   Gastrointestinal: Negative.   Genitourinary: Negative.   Musculoskeletal: Negative.   Skin: Negative.   Neurological: Negative.   Hematological: Negative.   Psychiatric/Behavioral:       Depression       Objective:   Physical Exam A well-developed obese African American woman who does not appear in any distress  Oriented x3 speech is clear affect bright alert cooperative pleasant follows commands without difficulty answers questions appropriately  Cranial nerves coordination intact  Reflexes 2+ patellar tendons 2+ Achilles tendons  No sensory deficits in lower legs  Motor strength 5 over 5 hip flexors knee extensors dorsiflexors plantar flexors and EHL  Leg raise is negative bilaterally  Patient transitions easily from sitting to standing, gait is slow but stable  Forward flexion of the lumbar spine does not exacerbate pain  Extension of lumbar spine increases pain in the low back and posterior hip  Internal and external rotation at the hips does not aggravate groin or posterior hip pain.         Assessment & Plan:  1. Cervicalgia. Last MRI on January 30, 2011, of the cervical spine,  mild central stenosis at C6-7, mild foraminal narrowing at C7-T1,  degenerative disk disease and cervical spondylosis. stable pain is not a problem at this time    2. Bilateral shoulder pain. resolved   3. Bilateral hand pain and numbness nocturnally. Pt is status post right CTS surgery. Overall improved but still some mild symptoms.  Waiting yet before considering left hand.  4. Chronic low back pain. MRI Lumbar reviewed. Pain began in May without obvious inciting event. Pt has long history of intermittant low back pain.  Will  trial lyrica Finish PT Will trial Naprosyn  Information given today regarding medial branch blocks for lumbar spine. Pain is predominantly right low back, right buttock and not past mid posterior thigh. She seems open to this procedure if medication management and physical therapy do not give her adequate relief.  May set her up for medial branch blocks in the next month.

## 2012-01-28 ENCOUNTER — Other Ambulatory Visit: Payer: Self-pay | Admitting: *Deleted

## 2012-01-28 MED ORDER — CYCLOBENZAPRINE HCL 5 MG PO TABS: 5.0000 mg | ORAL_TABLET | Freq: Three times a day (TID) | ORAL | Status: DC

## 2012-02-21 ENCOUNTER — Emergency Department (HOSPITAL_COMMUNITY)
Admission: EM | Admit: 2012-02-21 | Discharge: 2012-02-21 | Disposition: A | Discharge status: Home / Self Care | Payer: Self-pay | Attending: Emergency Medicine | Admitting: Emergency Medicine

## 2012-02-21 ENCOUNTER — Encounter (HOSPITAL_BASED_OUTPATIENT_CLINIC_OR_DEPARTMENT_OTHER): Payer: Self-pay | Admitting: Physical Medicine and Rehabilitation

## 2012-02-21 ENCOUNTER — Encounter: Payer: Self-pay | Admitting: Physical Medicine and Rehabilitation

## 2012-02-21 ENCOUNTER — Encounter (HOSPITAL_COMMUNITY): Payer: Self-pay | Admitting: Emergency Medicine

## 2012-02-21 VITALS — BP 164/94 | HR 87 | Resp 16 | Ht 65.0 in | Wt 336.0 lb

## 2012-02-21 DIAGNOSIS — Z79899 Other long term (current) drug therapy: Secondary | ICD-10-CM | POA: Insufficient documentation

## 2012-02-21 DIAGNOSIS — F3289 Other specified depressive episodes: Secondary | ICD-10-CM | POA: Insufficient documentation

## 2012-02-21 DIAGNOSIS — R51 Headache: Secondary | ICD-10-CM

## 2012-02-21 DIAGNOSIS — R42 Dizziness and giddiness: Secondary | ICD-10-CM

## 2012-02-21 DIAGNOSIS — K219 Gastro-esophageal reflux disease without esophagitis: Secondary | ICD-10-CM | POA: Insufficient documentation

## 2012-02-21 DIAGNOSIS — D509 Iron deficiency anemia, unspecified: Secondary | ICD-10-CM | POA: Insufficient documentation

## 2012-02-21 DIAGNOSIS — F329 Major depressive disorder, single episode, unspecified: Secondary | ICD-10-CM | POA: Insufficient documentation

## 2012-02-21 DIAGNOSIS — J45909 Unspecified asthma, uncomplicated: Secondary | ICD-10-CM | POA: Insufficient documentation

## 2012-02-21 DIAGNOSIS — I10 Essential (primary) hypertension: Secondary | ICD-10-CM

## 2012-02-21 LAB — CBC WITH DIFFERENTIAL/PLATELET
Basophils Relative: 1 % (ref 0–1)
Lymphocytes Relative: 39 % (ref 12–46)
Lymphs Abs: 2.8 10*3/uL (ref 0.7–4.0)
MCHC: 29 g/dL — ABNORMAL LOW (ref 30.0–36.0)
Monocytes Absolute: 0.5 10*3/uL (ref 0.1–1.0)
Monocytes Relative: 7 % (ref 3–12)
Neutro Abs: 3.1 10*3/uL (ref 1.7–7.7)
Neutrophils Relative %: 45 % (ref 43–77)
Platelets: 483 10*3/uL — ABNORMAL HIGH (ref 150–400)
RDW: 21.8 % — ABNORMAL HIGH (ref 11.5–15.5)
WBC: 7.1 10*3/uL (ref 4.0–10.5)

## 2012-02-21 LAB — COMPREHENSIVE METABOLIC PANEL
ALT: 9 U/L (ref 0–35)
Alkaline Phosphatase: 60 U/L (ref 39–117)
BUN: 8 mg/dL (ref 6–23)
CO2: 25 mEq/L (ref 19–32)
Calcium: 9 mg/dL (ref 8.4–10.5)
GFR calc Af Amer: >90 mL/min (ref >90)
GFR calc non Af Amer: >90 mL/min (ref >90)
Glucose, Bld: 96 mg/dL (ref 70–99)
Potassium: 3.7 mEq/L (ref 3.5–5.1)
Sodium: 137 mEq/L (ref 135–145)

## 2012-02-21 LAB — URINALYSIS, ROUTINE W REFLEX MICROSCOPIC
Hgb urine dipstick: NEGATIVE
Specific Gravity, Urine: 1.021 (ref 1.005–1.030)
pH: 6 (ref 5.0–8.0)

## 2012-02-21 LAB — POCT I-STAT TROPONIN I: Troponin i, poc: 0 ng/mL (ref 0.00–0.08)

## 2012-02-21 MED ORDER — FERROUS SULFATE 325 (65 FE) MG PO TABS: 325.0000 mg | ORAL_TABLET | Freq: Every day | ORAL | Status: DC

## 2012-02-21 MED ORDER — SODIUM CHLORIDE 0.9 % IV SOLN: INTRAVENOUS | Status: DC

## 2012-02-21 NOTE — ED Notes (Signed)
ZOX:[id number]<AV> Expected date:02/21/12<BR> Expected time: 1:43 PM<BR> Means of arrival:Ambulance<BR> Comments:<BR> Female from dr office dizzy and hypertensive.

## 2012-02-21 NOTE — ED Provider Notes (Signed)
History     CSN: [id number]  Arrival date & time 02/21/12  1349   First MD Initiated Contact with Patient 02/21/12 1549      Chief Complaint  Patient presents with  . Hypertension    (Consider location/radiation/quality/duration/timing/severity/associated sxs/prior treatment) Patient is a 41 y.o. female presenting with hypertension. The history is provided by the patient.  Hypertension   patient presents for pain clinic complaining of headaches and dizziness per month. She was noted to be hypertensive at the pain clinic with a blood pressure of 164 per 102. She notes that she has been compliant with her medications. Was going to help serve. Patient denies any severe anginal type chest pain or dyspnea. No lower extremity weakness. Patient goes to the pain clinic due 2 chronic back pain. No medications given for her blood pressure prior to arrival. She denies confusion, trouble walking or falls the  Past Medical History  Diagnosis Date  . High blood pressure   . Asthma   . GERD (gastroesophageal reflux disease)   . Depression   . Neuromuscular disorder     Past Surgical History  Procedure Date  . Gallbladder surgery 04/04/2010  . Tubal ligation   . Cholecystectomy 10/11    lap choli  . Carpal tunnel release 08/30/2011    Procedure: CARPAL TUNNEL RELEASE;  Surgeon: Nicki Reaper, MD;  Location: [facility];  Service: Orthopedics;  Laterality: Right;    Family History  Problem Relation Age of Onset  . Heart disease    . Diabetes    . Hypertension    . Alcohol abuse    . Diabetes Mother   . Hypertension Father     History  Substance Use Topics  . Smoking status: Current Some Day Smoker -- 0.5 packs/day    Types: Cigarettes  . Smokeless tobacco: Never Used  . Alcohol Use: 0.0 oz/week     rare    OB History    Grav Para Term Preterm Abortions TAB SAB Ect Mult Living                  Review of Systems  All other systems reviewed and are  negative.    Allergies  Review of patient's allergies indicates no known allergies.  Home Medications   Current Outpatient Rx  Name Route Sig Dispense Refill  . ALBUTEROL SULFATE HFA 108 (90 BASE) MCG/ACT IN AERS Inhalation Inhale 2 puffs into the lungs every 6 (six) hours as needed. For shortness of breath    . BECLOMETHASONE DIPROPIONATE 40 MCG/ACT IN AERS Inhalation Inhale 2 puffs into the lungs 2 (two) times daily.    . BUSPIRONE HCL 15 MG PO TABS Oral Take 15 mg by mouth 2 (two) times daily.     . CYCLOBENZAPRINE HCL 5 MG PO TABS Oral Take 5 mg by mouth 3 (three) times daily as needed. For muscle spasms    . DULOXETINE HCL 60 MG PO CPEP Oral Take 60 mg by mouth daily.    Marland Kitchen LISINOPRIL 20 MG PO TABS Oral Take 20 mg by mouth daily.    . MOMETASONE FUROATE 50 MCG/ACT NA SUSP Nasal Place 2 sprays into the nose daily as needed. For congestion    . RANITIDINE HCL 150 MG PO CAPS Oral Take 150 mg by mouth 2 (two) times daily.      BP 149/96  Pulse 104  Temp 98.6 F (37 C) (Oral)  Resp 26  SpO2 100%  Physical Exam  Nursing note and vitals reviewed. Constitutional: She is oriented to person, place, and time. She appears well-developed and well-nourished.  Non-toxic appearance. No distress.  HENT:  Head: Normocephalic and atraumatic.  Eyes: Conjunctivae, EOM and lids are normal. Pupils are equal, round, and reactive to light.  Neck: Normal range of motion. Neck supple. No tracheal deviation present. No mass present.  Cardiovascular: Normal rate, regular rhythm and normal heart sounds.  Exam reveals no gallop.   No murmur heard. Pulmonary/Chest: Effort normal and breath sounds normal. No stridor. No respiratory distress. She has no decreased breath sounds. She has no wheezes. She has no rhonchi. She has no rales.  Abdominal: Soft. Normal appearance and bowel sounds are normal. She exhibits no distension. There is no tenderness. There is no rebound and no CVA tenderness.   Musculoskeletal: Normal range of motion. She exhibits no edema and no tenderness.  Neurological: She is alert and oriented to person, place, and time. She has normal strength. No cranial nerve deficit or sensory deficit. GCS eye subscore is 4. GCS verbal subscore is 5. GCS motor subscore is 6.  Skin: Skin is warm and dry. No abrasion and no rash noted.  Psychiatric: She has a normal mood and affect. Her speech is normal and behavior is normal.    ED Course  Procedures (including critical care time)   Labs Reviewed  COMPREHENSIVE METABOLIC PANEL  CBC WITH DIFFERENTIAL  URINALYSIS, ROUTINE W REFLEX MICROSCOPIC   No results found.   No diagnosis found.    MDM   Date: 02/21/2012  Rate: 86  Rhythm: normal sinus rhythm  QRS Axis: normal  Intervals: normal  ST/T Wave abnormalities: normal  Conduction Disutrbances:none  Narrative Interpretation:   Old EKG Reviewed: unchanged   6:20 PM Patient relates history of anemia and her hemoglobin today is 8. She'll be placed on iron tablets. She denies any gastrointestinal bleeding or heavy periods. Blood pressure has been stable here. Due for discharge       Toy Baker, MD 02/21/12 1820

## 2012-02-21 NOTE — ED Notes (Signed)
Pt states she also has been having some foot swelling.  Pt states she has been having BP at home has been 200 to 210 systolic and 100 to 110 diastolic at home.

## 2012-02-21 NOTE — ED Notes (Addendum)
Per EMS.  Pt from pain clinic.  Pt states she has been having HAs and dizziness for a month.  Pain clinic found BP of 164/102.  Sent here for elevated bp.  Pt able to walk to room.  Pt states she also has some nausea.

## 2012-02-21 NOTE — Progress Notes (Signed)
Subjective:    Patient ID: Cynthia Becker, female    DOB: [birth year], 41 y.o.   MRN: [id number]  HPI Cynthia Becker is a pleasant married African American woman who is  referred by Dr. Yisroel Ramming.    [name] has a history of chronic neck pain as well  as bilateral hand numbness and tingling which radiates to the forearm  nocturnally.   She had carpal tunnel surgery per Dr. Merlyn Lot in March of this year.   She has returned to clinic today for recheck however her chief complaint is headache, light headedness and dizziness. Standing worsens it.  Blood pressures noted to be elevated see below    MRI of her cervical spine. January 30, 2011, which showed a mild central  stenosis at C6-7 and mild foraminal narrowing at C7-T1. Cervical  spondylosis and degenerative disk disease with no significant evidence of  abnormal cord signal identified.    This problem had been stable.  In the last week of May she had an exacerbation of low back pain.  She also has a history of some low back pain and has L5-S1 degenerative  disk disease.     12/30/2011 Lumbar MRI  RADIOLOGY REPORT*  Clinical Data: Low back pain extending into the right thigh for 2.5  years. No recent injury or prior relevant surgery.  MRI LUMBAR SPINE WITHOUT CONTRAST  Technique: Multiplanar and multiecho pulse sequences of the lumbar  spine were obtained without intravenous contrast.  Comparison: Lumbar MRI 11/01/2009.  Findings: Detail remains mildly limited by body habitus. Five  lumbar type vertebral bodies are again assumed. The lumbar  alignment is normal. There is no evidence of fracture. There is  no definite pars defect. Schmorl's node formation at L5-S1 appears  stable.  The conus medullaris extends to the L2 level and appears normal.  There are no paraspinal abnormalities. The uterus is prominent  with mild myometrial heterogeneity.  There are no significant disc space findings from T11-T12 through    L3-L4. L4-L5: Disc height and hydration are maintained. There is stable  mild bilateral facet hypertrophy. No spinal stenosis or nerve root  encroachment.  L5-S1: There is stable annular disc bulging eccentric to the  right. Mild facet and ligamentous hypertrophy is present. The  central canal and lateral recesses are patent. There is unchanged  right greater than left foraminal stenosis with possible chronic  right L5 nerve root encroachment.  IMPRESSION:  1. Compared with the prior study from 2011, no significant changes  are identified.  2. Chronic degenerative disc disease at L5-S1 with asymmetric disc  bulging towards the right. The resulting right greater than left  foraminal stenosis and possible chronic right L5 nerve root  encroachment are unchanged.  3. Stable mild facet disease at L4-L5 and L5-S1.  Original Report Authenticated By: Gerrianne Scale, M.D.  She has been using tramadol and gabapentin, and is getting a  little relief with it.     Recent carpal tunnel surgery on the right hand per Dr. Merlyn Lot on 08/30/2011.      Also advised patient to talk to her counselor about OCD, and how this affects her low back pain, when she vacuums and mobs the floor 3-5 times a day.   She had been in physical therapy for her hands a few months ago but has now completed.   Past visits have focused on low back pain however today with elevated blood pressure will defer treatment plans until she is stable  with respect to this   Pain Inventory Average Pain 7 Pain Right Now 7 My pain is constant  In the last 24 hours, has pain interfered with the following? General activity 2 Relation with others 2 Enjoyment of life 3 What TIME of day is your pain at its worst? evening Sleep (in general) Fair  Pain is worse with: walking and standing Pain improves with: medication Relief from Meds: 3  Mobility walk without assistance how many minutes can you walk? 15 ability to climb  steps?  yes do you drive?  yes  Function not employed: date last employed   Neuro/Psych No problems in this area  Prior Studies Any changes since last visit?  no  Physicians involved in your care Any changes since last visit?  no   Family History  Problem Relation Age of Onset  . Heart disease    . Diabetes    . Hypertension    . Alcohol abuse    . Diabetes Mother   . Hypertension Father    History   Social History  . Marital Status: Married    Spouse Name: N/A    Number of Children: N/A  . Years of Education: N/A   Social History Main Topics  . Smoking status: Current Some Day Smoker -- 0.5 packs/day    Types: Cigarettes  . Smokeless tobacco: Never Used  . Alcohol Use: 0.0 oz/week     rare  . Drug Use: No  . Sexually Active: None   Other Topics Concern  . None   Social History Narrative  . None   Past Surgical History  Procedure Date  . Gallbladder surgery 04/04/2010  . Tubal ligation   . Cholecystectomy 10/11    lap choli  . Carpal tunnel release 08/30/2011    Procedure: CARPAL TUNNEL RELEASE;  Surgeon: Nicki Reaper, MD;  Location: [facility];  Service: Orthopedics;  Laterality: Right;   Past Medical History  Diagnosis Date  . High blood pressure   . Asthma   . GERD (gastroesophageal reflux disease)   . Depression   . Neuromuscular disorder    BP 164/94  Pulse 87  Resp 16  Ht 5\' 5"  (1.651 m)  Wt 336 lb (152.409 kg)  BMI 55.91 kg/m2  SpO2 99%     Review of Systems  HENT: Positive for neck pain.   Musculoskeletal: Positive for myalgias, back pain and arthralgias.  All other systems reviewed and are negative.       Objective:   Physical Exam  158/100 manual, 160/102 automated cuff. Pt is lighted headed with headache.     A well-developed obese African American woman who appears anxious Oriented x3 speech is clear, alert,cooperative pleasant follows commands without difficulty answers questions appropriately    Cranial nerves coordination intact   Reflexes 2+ patellar tendons 2+ Achilles tendons   No sensory deficits in lower legs, face or upper extremities. Motor strength 5 over 5 hip flexors knee extensors dorsiflexors plantar flexors and EHL  Leg raise is negative bilaterally  Patient transitions easily from sitting to standing, gait is slow but stable  Forward flexion of the lumbar spine does not exacerbate pain  Extension of lumbar spine increases pain in the low back and posterior hip  Internal and external rotation at the hips does not aggravate groin or posterior hip pain.   Dizzy when standing, light headed further testing defered.        Assessment & Plan:  1. Hypertension  dizziness headache light headedness malaise. Will have EMS take patient to the ER for further treatment.  Problems treated in the past here at [facility] have included:  2. Cervicalgia. Last MRI on January 30, 2011, of the cervical spine,  mild central stenosis at C6-7, mild foraminal narrowing at C7-T1,  degenerative disk disease and cervical spondylosis. stable pain is not a problem at this time  3. Bilateral shoulder pain. resolved  4. Bilateral hand pain and numbness nocturnally. Pt is status post right CTS surgery. Overall improved but still some mild symptoms. Waiting yet before considering left hand.  5. Chronic low back pain. MRI Lumbar reviewed. Pain began in May without obvious inciting event. Pt has long history of intermittant low back pain. Will explore more options for treatment when she is stable.   Lyrica  was cost prohibitive patient did not trial    Pain is predominantly right low back, right buttock and not past mid posterior thigh. She seems open to this procedure if medication management and physical therapy do not give her adequate relief.  May set her up for medial branch blocks in the next month. Will hold off for now until she is stable regarding  blood pressure.  She is currently not taking any medications prescribed by our clinic.  Blood pressure elevated today, associated with light headedness and headache. Advised emergency room for treatment.   EMS has been notified.

## 2012-02-21 NOTE — Patient Instructions (Addendum)
Blood pressure is elevated today . I also understand you are light head and dizzy as well as had a headache.  We have called EMS to take you to Emergency room for treatment.

## 2012-02-21 NOTE — ED Notes (Signed)
Pt states she has some sob.  Pulse ox 100% on RA.  Lungs clear.  No distress noted.

## 2012-07-31 ENCOUNTER — Emergency Department (INDEPENDENT_AMBULATORY_CARE_PROVIDER_SITE_OTHER)
Admission: EM | Admit: 2012-07-31 | Discharge: 2012-07-31 | Disposition: A | Discharge status: Home / Self Care | Payer: Self-pay | Source: Home / Self Care

## 2012-07-31 ENCOUNTER — Encounter (HOSPITAL_COMMUNITY): Payer: Self-pay | Admitting: *Deleted

## 2012-07-31 DIAGNOSIS — J069 Acute upper respiratory infection, unspecified: Secondary | ICD-10-CM

## 2012-07-31 DIAGNOSIS — J020 Streptococcal pharyngitis: Secondary | ICD-10-CM

## 2012-07-31 LAB — POCT RAPID STREP A: Streptococcus, Group A Screen (Direct): POSITIVE — AB

## 2012-07-31 MED ORDER — AMOXICILLIN 500 MG PO CAPS: 1000.0000 mg | ORAL_CAPSULE | Freq: Two times a day (BID) | ORAL | Status: DC

## 2012-07-31 MED ORDER — PHENYLEPHRINE-CHLORPHEN-DM 10-4-12.5 MG/5ML PO LIQD: 5.0000 mL | ORAL | Status: DC | PRN

## 2012-07-31 NOTE — ED Notes (Signed)
Pt reports uri symptoms for the past three weeks with cough, sore throat and bilateral ear ache

## 2012-07-31 NOTE — ED Provider Notes (Signed)
Medical screening examination/treatment/procedure(s) were performed by resident physician or non-physician practitioner and as supervising physician I was immediately available for consultation/collaboration.   Barkley Bruns MD.    Linna Hoff, MD 07/31/12 [phone]

## 2012-07-31 NOTE — ED Provider Notes (Signed)
History     CSN: [id number]  Arrival date & time 07/31/12  1027   First MD Initiated Contact with Patient 07/31/12 1108      Chief Complaint  Patient presents with  . URI    (Consider location/radiation/quality/duration/timing/severity/associated sxs/prior treatment) HPI Comments: 42 year old female presents with upper respiratory congestion, earaches pressure in the face sore throat and malaise. She admits to temperature of 100 at home. 2 days ago she vomited once but none since then. Her appetite has changed. No abdominal pain or diarrhea. No chest pain or unusual shortness of breath.   Past Medical History  Diagnosis Date  . High blood pressure   . Asthma   . GERD (gastroesophageal reflux disease)   . Depression   . Neuromuscular disorder     Past Surgical History  Procedure Date  . Gallbladder surgery 04/04/2010  . Tubal ligation   . Cholecystectomy 10/11    lap choli  . Carpal tunnel release 08/30/2011    Procedure: CARPAL TUNNEL RELEASE;  Surgeon: Nicki Reaper, MD;  Location: [facility];  Service: Orthopedics;  Laterality: Right;    Family History  Problem Relation Age of Onset  . Heart disease    . Diabetes    . Hypertension    . Alcohol abuse    . Diabetes Mother   . Hypertension Father     History  Substance Use Topics  . Smoking status: Current Some Day Smoker -- 0.5 packs/day    Types: Cigarettes  . Smokeless tobacco: Never Used  . Alcohol Use: 0.0 oz/week     Comment: rare    OB History    Grav Para Term Preterm Abortions TAB SAB Ect Mult Living                  Review of Systems  Constitutional: Positive for fever, appetite change and fatigue. Negative for chills and activity change.  HENT: Positive for congestion, sore throat, rhinorrhea and postnasal drip. Negative for facial swelling, neck pain and neck stiffness.   Eyes: Negative.   Respiratory: Negative.   Cardiovascular: Negative.   Genitourinary: Negative.   Skin:  Negative for pallor and rash.  Neurological: Negative.   Psychiatric/Behavioral: Negative.     Allergies  Review of patient's allergies indicates no known allergies.  Home Medications   Current Outpatient Rx  Name  Route  Sig  Dispense  Refill  . HYDROCODONE-ACETAMINOPHEN 5-325 MG PO TABS   Oral   Take 2 tablets by mouth 1 day or 1 dose.         Marland Kitchen LISINOPRIL 20 MG PO TABS   Oral   Take 20 mg by mouth daily.         . ALBUTEROL SULFATE HFA 108 (90 BASE) MCG/ACT IN AERS   Inhalation   Inhale 2 puffs into the lungs every 6 (six) hours as needed. For shortness of breath         . AMOXICILLIN 500 MG PO CAPS   Oral   Take 2 capsules (1,000 mg total) by mouth 2 (two) times daily.   28 capsule   0   . BECLOMETHASONE DIPROPIONATE 40 MCG/ACT IN AERS   Inhalation   Inhale 2 puffs into the lungs 2 (two) times daily.         . BUSPIRONE HCL 15 MG PO TABS   Oral   Take 15 mg by mouth 2 (two) times daily.          Marland Kitchen  CYCLOBENZAPRINE HCL 5 MG PO TABS   Oral   Take 5 mg by mouth 3 (three) times daily as needed. For muscle spasms         . DULOXETINE HCL 60 MG PO CPEP   Oral   Take 60 mg by mouth daily.         Marland Kitchen FERROUS SULFATE 325 (65 FE) MG PO TABS   Oral   Take 1 tablet (325 mg total) by mouth daily.   30 tablet   0   . MOMETASONE FUROATE 50 MCG/ACT NA SUSP   Nasal   Place 2 sprays into the nose daily as needed. For congestion         . PHENYLEPHRINE-CHLORPHEN-DM 03-28-11.5 MG/5ML PO LIQD   Oral   Take 5 mLs by mouth every 4 (four) hours as needed.   120 mL   0   . RANITIDINE HCL 150 MG PO CAPS   Oral   Take 150 mg by mouth 2 (two) times daily.           BP 153/99  Pulse 116  Temp 99.2 F (37.3 C) (Oral)  Resp 20  SpO2 97%  Physical Exam  Nursing note and vitals reviewed. Constitutional: She is oriented to person, place, and time. She appears well-developed and well-nourished. No distress.  HENT:       Bilateral TMs are retracted, no  erythema or effusion. Oropharynx with erythema clear PND and a few exudates.  Eyes: Conjunctivae normal and EOM are normal.  Neck: Normal range of motion. Neck supple.  Cardiovascular: Normal rate and regular rhythm.   Pulmonary/Chest: Effort normal and breath sounds normal. No respiratory distress. She has no rales.       Coughing produces minor short wheeze. No wheezes on forced expiration.  Musculoskeletal: Normal range of motion. She exhibits no edema.  Lymphadenopathy:    She has no cervical adenopathy.  Neurological: She is alert and oriented to person, place, and time.  Skin: Skin is warm and dry. No rash noted.  Psychiatric: She has a normal mood and affect.    ED Course  Procedures (including critical care time)  Labs Reviewed  POCT RAPID STREP A ([facility] ONLY) - Abnormal; Notable for the following:    Streptococcus, Group A Screen (Direct) POSITIVE (*)     All other components within normal limits   No results found.   1. URI (upper respiratory infection)   2. Strep pharyngitis       MDM  Amoxicillin 1000 mg twice a day for 7 days Norell CS 1 teaspoon every 4 hours when necessary cough and congestion. Use her albuterol HFA at home as directed for any wheeze or persistent cough. Tylenol every 4 hours as needed for discomfort or fever; Or may also use ibuprofen every 6 hours as needed when necessary Drink plenty of fluids stay well hydrated No work for at least 24 hours.         Hayden Rasmussen, NP 07/31/12 [phone]

## 2012-08-31 ENCOUNTER — Emergency Department (HOSPITAL_COMMUNITY)
Admission: EM | Admit: 2012-08-31 | Discharge: 2012-09-01 | Disposition: A | Discharge status: Home / Self Care | Payer: Self-pay | Attending: Emergency Medicine | Admitting: Emergency Medicine

## 2012-08-31 DIAGNOSIS — I10 Essential (primary) hypertension: Secondary | ICD-10-CM | POA: Insufficient documentation

## 2012-08-31 DIAGNOSIS — F17200 Nicotine dependence, unspecified, uncomplicated: Secondary | ICD-10-CM | POA: Insufficient documentation

## 2012-08-31 DIAGNOSIS — Z79899 Other long term (current) drug therapy: Secondary | ICD-10-CM | POA: Insufficient documentation

## 2012-08-31 DIAGNOSIS — Z862 Personal history of diseases of the blood and blood-forming organs and certain disorders involving the immune mechanism: Secondary | ICD-10-CM | POA: Insufficient documentation

## 2012-08-31 DIAGNOSIS — Z8719 Personal history of other diseases of the digestive system: Secondary | ICD-10-CM | POA: Insufficient documentation

## 2012-08-31 DIAGNOSIS — R0982 Postnasal drip: Secondary | ICD-10-CM | POA: Insufficient documentation

## 2012-08-31 DIAGNOSIS — J45909 Unspecified asthma, uncomplicated: Secondary | ICD-10-CM | POA: Insufficient documentation

## 2012-08-31 DIAGNOSIS — J3489 Other specified disorders of nose and nasal sinuses: Secondary | ICD-10-CM | POA: Insufficient documentation

## 2012-08-31 DIAGNOSIS — J020 Streptococcal pharyngitis: Secondary | ICD-10-CM | POA: Insufficient documentation

## 2012-08-31 DIAGNOSIS — J069 Acute upper respiratory infection, unspecified: Secondary | ICD-10-CM | POA: Insufficient documentation

## 2012-08-31 DIAGNOSIS — R51 Headache: Secondary | ICD-10-CM | POA: Insufficient documentation

## 2012-08-31 DIAGNOSIS — R1310 Dysphagia, unspecified: Secondary | ICD-10-CM | POA: Insufficient documentation

## 2012-08-31 DIAGNOSIS — Z8639 Personal history of other endocrine, nutritional and metabolic disease: Secondary | ICD-10-CM | POA: Insufficient documentation

## 2012-08-31 DIAGNOSIS — R059 Cough, unspecified: Secondary | ICD-10-CM | POA: Insufficient documentation

## 2012-08-31 DIAGNOSIS — Z8659 Personal history of other mental and behavioral disorders: Secondary | ICD-10-CM | POA: Insufficient documentation

## 2012-08-31 DIAGNOSIS — R509 Fever, unspecified: Secondary | ICD-10-CM | POA: Insufficient documentation

## 2012-09-01 ENCOUNTER — Emergency Department (HOSPITAL_COMMUNITY): Payer: Self-pay

## 2012-09-01 ENCOUNTER — Encounter (HOSPITAL_COMMUNITY): Payer: Self-pay | Admitting: *Deleted

## 2012-09-01 LAB — CBC WITH DIFFERENTIAL/PLATELET
Basophils Absolute: 0.1 10*3/uL (ref 0.0–0.1)
Basophils Relative: 1 % (ref 0–1)
Eosinophils Absolute: 0.6 10*3/uL (ref 0.0–0.7)
Hemoglobin: 10.7 g/dL — ABNORMAL LOW (ref 12.0–15.0)
Lymphocytes Relative: 18 % (ref 12–46)
MCHC: 29.8 g/dL — ABNORMAL LOW (ref 30.0–36.0)
Neutrophils Relative %: 68 % (ref 43–77)
RDW: 21.9 % — ABNORMAL HIGH (ref 11.5–15.5)

## 2012-09-01 LAB — POCT I-STAT, CHEM 8
HCT: 33 % — ABNORMAL LOW (ref 36.0–46.0)
Hemoglobin: 11.2 g/dL — ABNORMAL LOW (ref 12.0–15.0)
Sodium: 139 mEq/L (ref 135–145)
TCO2: 23 mmol/L (ref 0–100)

## 2012-09-01 LAB — RAPID STREP SCREEN (MED CTR MEBANE ONLY): Streptococcus, Group A Screen (Direct): POSITIVE — AB

## 2012-09-01 MED ORDER — PENICILLIN G BENZATHINE 1200000 UNIT/2ML IM SUSP
1.2000 10*6.[IU] | Freq: Once | INTRAMUSCULAR | Status: AC
  Administered 2012-09-01: 1.2 10*6.[IU] via INTRAMUSCULAR
  Filled 2012-09-01: qty 2

## 2012-09-01 MED ORDER — HYDROCODONE-ACETAMINOPHEN 5-325 MG PO TABS: 1.0000 | ORAL_TABLET | Freq: Four times a day (QID) | ORAL | Status: DC | PRN

## 2012-09-01 MED ORDER — HYDROCODONE-ACETAMINOPHEN 5-325 MG PO TABS
1.0000 | ORAL_TABLET | Freq: Once | ORAL | Status: AC
  Administered 2012-09-01: 1 via ORAL
  Filled 2012-09-01: qty 1

## 2012-09-01 MED ORDER — MOMETASONE FUROATE 50 MCG/ACT NA SUSP: 2.0000 | Freq: Every day | NASAL | Status: DC | PRN

## 2012-09-01 NOTE — ED Provider Notes (Signed)
History     CSN: [id number]  Arrival date & time 08/31/12  2315   First MD Initiated Contact with Patient 09/01/12 0125      Chief Complaint  Patient presents with  . Nasal Congestion  . Sore Throat  . URI    (Consider location/radiation/quality/duration/timing/severity/associated sxs/prior treatment) HPI Comments: Patient has ben on 2 rounds of antibiotics in Feb now still having facial pain, sore throat feeling of difficulty swallowing due to pain, post nasal drip, cough and fever   The history is provided by the patient.    Past Medical History  Diagnosis Date  . High blood pressure   . Asthma   . GERD (gastroesophageal reflux disease)   . Depression   . Neuromuscular disorder     Past Surgical History  Procedure Laterality Date  . Gallbladder surgery  04/04/2010  . Tubal ligation    . Cholecystectomy  10/11    lap choli  . Carpal tunnel release  08/30/2011    Procedure: CARPAL TUNNEL RELEASE;  Surgeon: Nicki Reaper, MD;  Location: [facility];  Service: Orthopedics;  Laterality: Right;    Family History  Problem Relation Age of Onset  . Heart disease    . Diabetes    . Hypertension    . Alcohol abuse    . Diabetes Mother   . Hypertension Father     History  Substance Use Topics  . Smoking status: Current Some Day Smoker -- 0.50 packs/day    Types: Cigarettes  . Smokeless tobacco: Never Used  . Alcohol Use: 0.0 oz/week     Comment: rare    OB History   Grav Para Term Preterm Abortions TAB SAB Ect Mult Living                  Review of Systems  Constitutional: Positive for fever and chills. Negative for appetite change.  HENT: Positive for congestion, sore throat, rhinorrhea, trouble swallowing, postnasal drip and sinus pressure.   Respiratory: Positive for cough. Negative for shortness of breath and wheezing.   Gastrointestinal: Negative for nausea and vomiting.  Genitourinary: Negative for dysuria.  Neurological: Positive for  headaches.  All other systems reviewed and are negative.    Allergies  Review of patient's allergies indicates no known allergies.  Home Medications   Current Outpatient Rx  Name  Route  Sig  Dispense  Refill  . albuterol (PROVENTIL HFA;VENTOLIN HFA) 108 (90 BASE) MCG/ACT inhaler   Inhalation   Inhale 2 puffs into the lungs every 6 (six) hours as needed. For shortness of breath         . beclomethasone (QVAR) 40 MCG/ACT inhaler   Inhalation   Inhale 2 puffs into the lungs 2 (two) times daily.         Marland Kitchen lisinopril (PRINIVIL,ZESTRIL) 20 MG tablet   Oral   Take 20 mg by mouth daily.         Marland Kitchen HYDROcodone-acetaminophen (NORCO/VICODIN) 5-325 MG per tablet   Oral   Take 1 tablet by mouth every 6 (six) hours as needed for pain.   20 tablet   0   . mometasone (NASONEX) 50 MCG/ACT nasal spray   Nasal   Place 2 sprays into the nose daily as needed. For congestion   17 g   0     BP 117/66  Pulse 111  Temp(Src) 99.9 F (37.7 C) (Oral)  Resp 20  SpO2 100%  LMP 08/14/2012  Physical Exam  Nursing note and vitals reviewed. Constitutional: She is oriented to person, place, and time. She appears well-developed and well-nourished.  HENT:  Head: Normocephalic and atraumatic. No trismus in the jaw.  Right Ear: External ear normal.  Left Ear: External ear normal.  Mouth/Throat: Uvula is midline. No edematous. Posterior oropharyngeal erythema present. No oropharyngeal exudate, posterior oropharyngeal edema or tonsillar abscesses.  Eyes: Pupils are equal, round, and reactive to light.  Neck: Normal range of motion.  Cardiovascular: Regular rhythm.  Tachycardia present.   Pulmonary/Chest: Effort normal and breath sounds normal. No respiratory distress. She has no wheezes. She exhibits no tenderness.  Musculoskeletal: Normal range of motion. She exhibits no tenderness.  Lymphadenopathy:    She has no cervical adenopathy.  Neurological: She is alert and oriented to person,  place, and time.  Skin: Skin is warm and dry. No rash noted. There is pallor.    ED Course  Procedures (including critical care time)  Labs Reviewed  RAPID STREP SCREEN - Abnormal; Notable for the following:    Streptococcus, Group A Screen (Direct) POSITIVE (*)    All other components within normal limits  CBC WITH DIFFERENTIAL - Abnormal; Notable for the following:    RBC 7.30 (*)    Hemoglobin 10.7 (*)    HCT 35.9 (*)    MCV 49.2 (*)    MCH 14.7 (*)    MCHC 29.8 (*)    RDW 21.9 (*)    All other components within normal limits  POCT I-STAT, CHEM 8 - Abnormal; Notable for the following:    Glucose, Bld 112 (*)    Hemoglobin 11.2 (*)    HCT 33.0 (*)    All other components within normal limits   Dg Chest 2 View  09/01/2012  *RADIOLOGY REPORT*  Clinical Data: Asthma, shortness of breath, fever, cough  CHEST - 2 VIEW  Comparison: 09/27/2011  Findings: Mild enlargement of cardiac silhouette with pulmonary vascular congestion. Mediastinal contours normal. Bronchitic changes without infiltrate, pleural effusion, or pneumothorax. No acute osseous findings.  IMPRESSION: Enlargement of cardiac silhouette with pulmonary vascular congestion. Bronchitic changes.   Original Report Authenticated By: Ulyses Southward, M.D.    Ct Maxillofacial Wo Cm  09/01/2012  *RADIOLOGY REPORT*  Clinical Data: Nasal congestion, sore throat  CT MAXILLOFACIAL WITHOUT CONTRAST  Technique:  Multidetector CT imaging of the maxillofacial structures was performed. Multiplanar CT image reconstructions were also generated.  Comparison: 01/30/2011 c-spine MRI  Findings: Visualized intracranial contents are within normal limits.  Globes are symmetric.  Lenses are located.  No retrobulbar process. Orbital walls are intact.  Paranasal sinuses are clear.  Sinus walls are intact.  Retro antral fat is clear.  Intact nasal septum and nasal bones.  Intact zygomatic arches and pterygoid plates.  Intact mandible.  Temporal mandibular joints  located.  Symmetric submandibular glands and parotid glands.  No appreciable lymphadenopathy. Upper airway clear. Normal epiglottis.  Nonspecific mild asymmetric fullness along the left oropharynx.  No appreciable fluid collection.  Parapharyngeal fat is clear.  Upper cervical spine intact.  IMPRESSION: Nonspecific mild asymmetric fullness along the left oropharynx. Correlate with direct inspection.  Otherwise, no acute maxillofacial process identified.   Original Report Authenticated By: Jearld Lesch, M.D.      1. Strep pharyngitis   2. Nasal congestion       MDM  Will treat with IM Bicillin and antihistamine, pain medication        Arman Filter, NP 09/01/12 0301  Arman Filter,  NP 09/01/12 0302

## 2012-09-01 NOTE — ED Provider Notes (Signed)
Medical screening examination/treatment/procedure(s) were performed by non-physician practitioner and as supervising physician I was immediately available for consultation/collaboration.  Doug Sou, MD 09/01/12 [phone]

## 2012-09-01 NOTE — ED Notes (Signed)
Pt has been on 2 antibiotics 2/07  for respiratory infection and has also been seen by her pcp on 2/21

## 2012-12-11 IMAGING — CR DG CHEST 2V
2 series · 2 of 2 positions shown · non-contrast
Comparison: 04/24/2011

CLINICAL DATA: Of breath with cough.  Congestion

CHEST - 2 VIEW

[w chest pa]
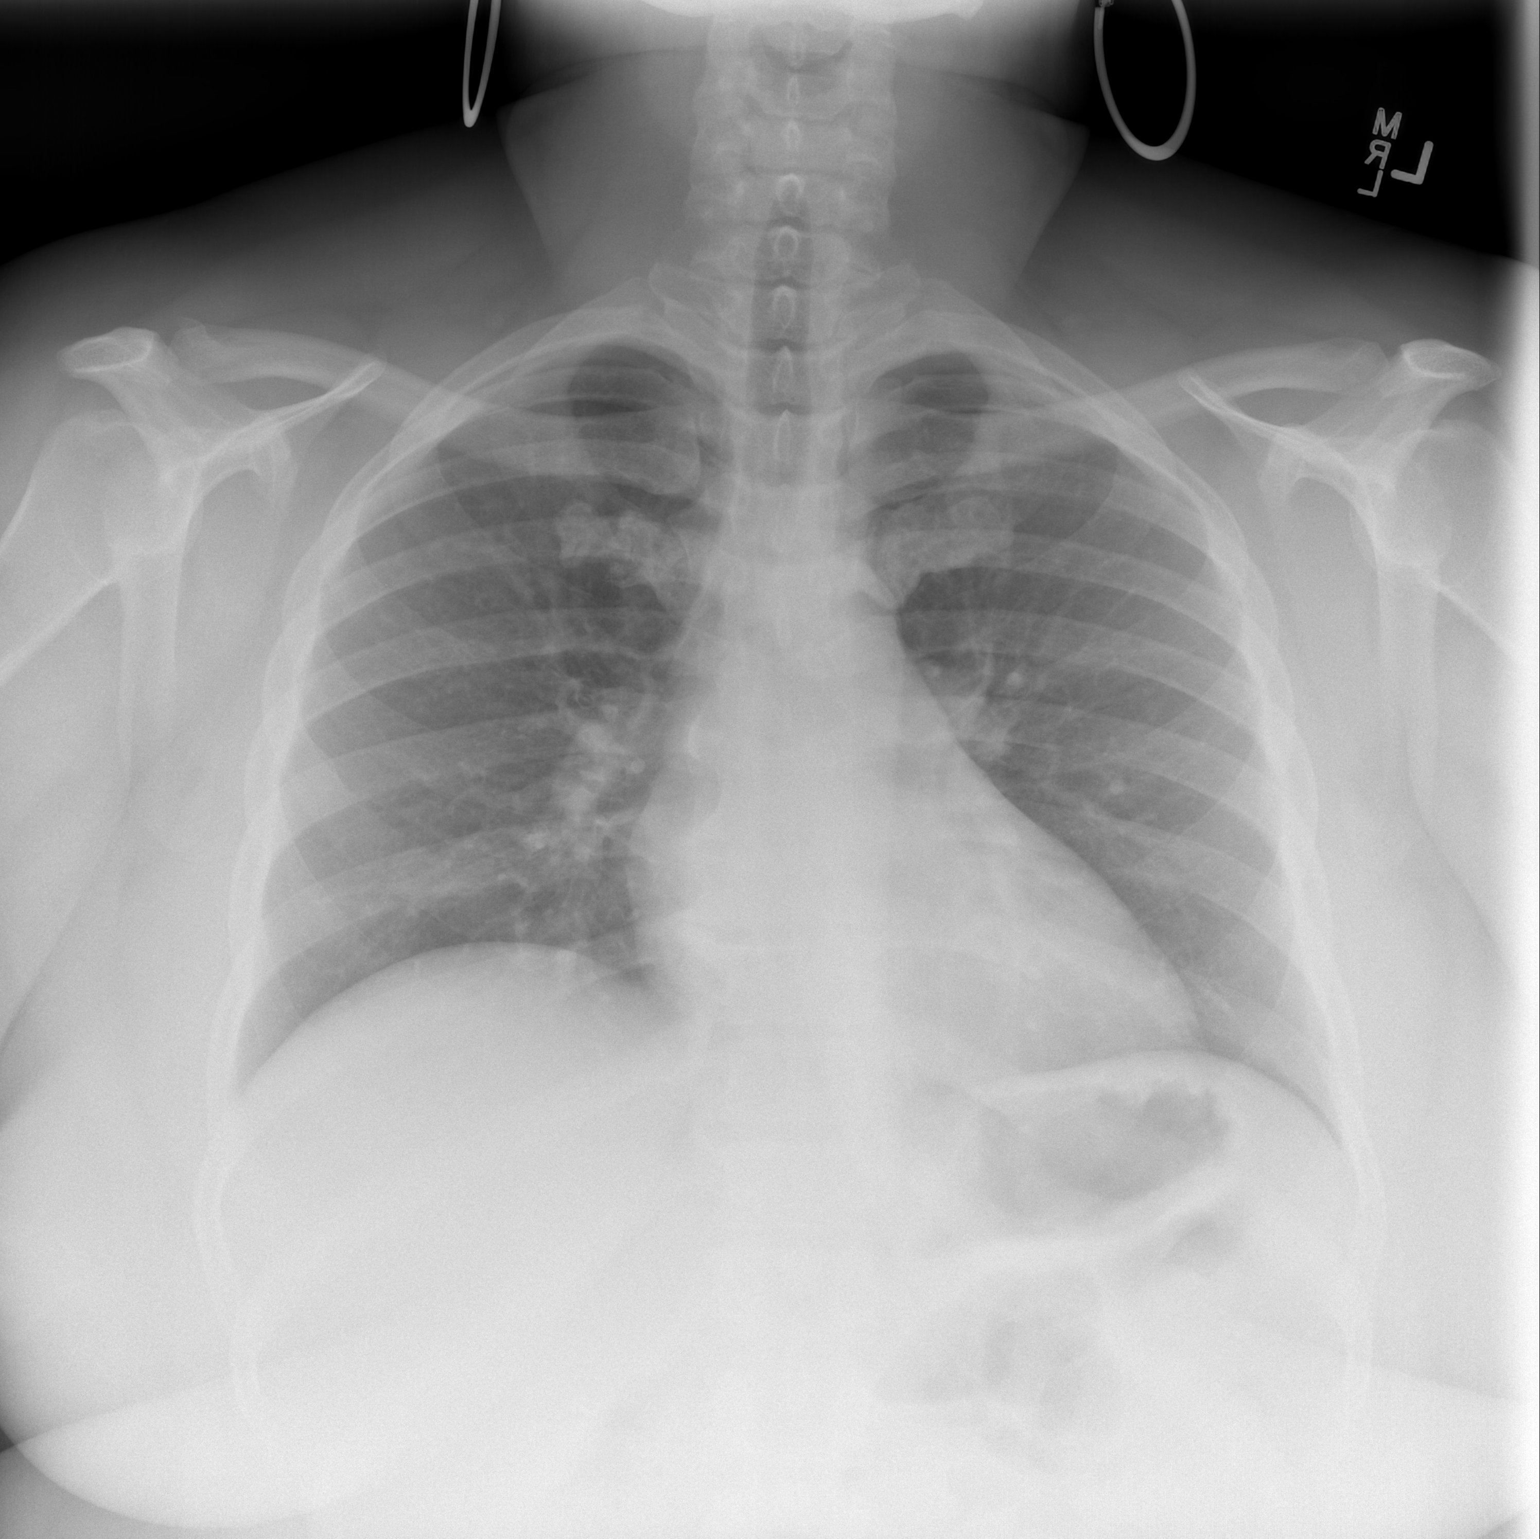

[w chest lat]
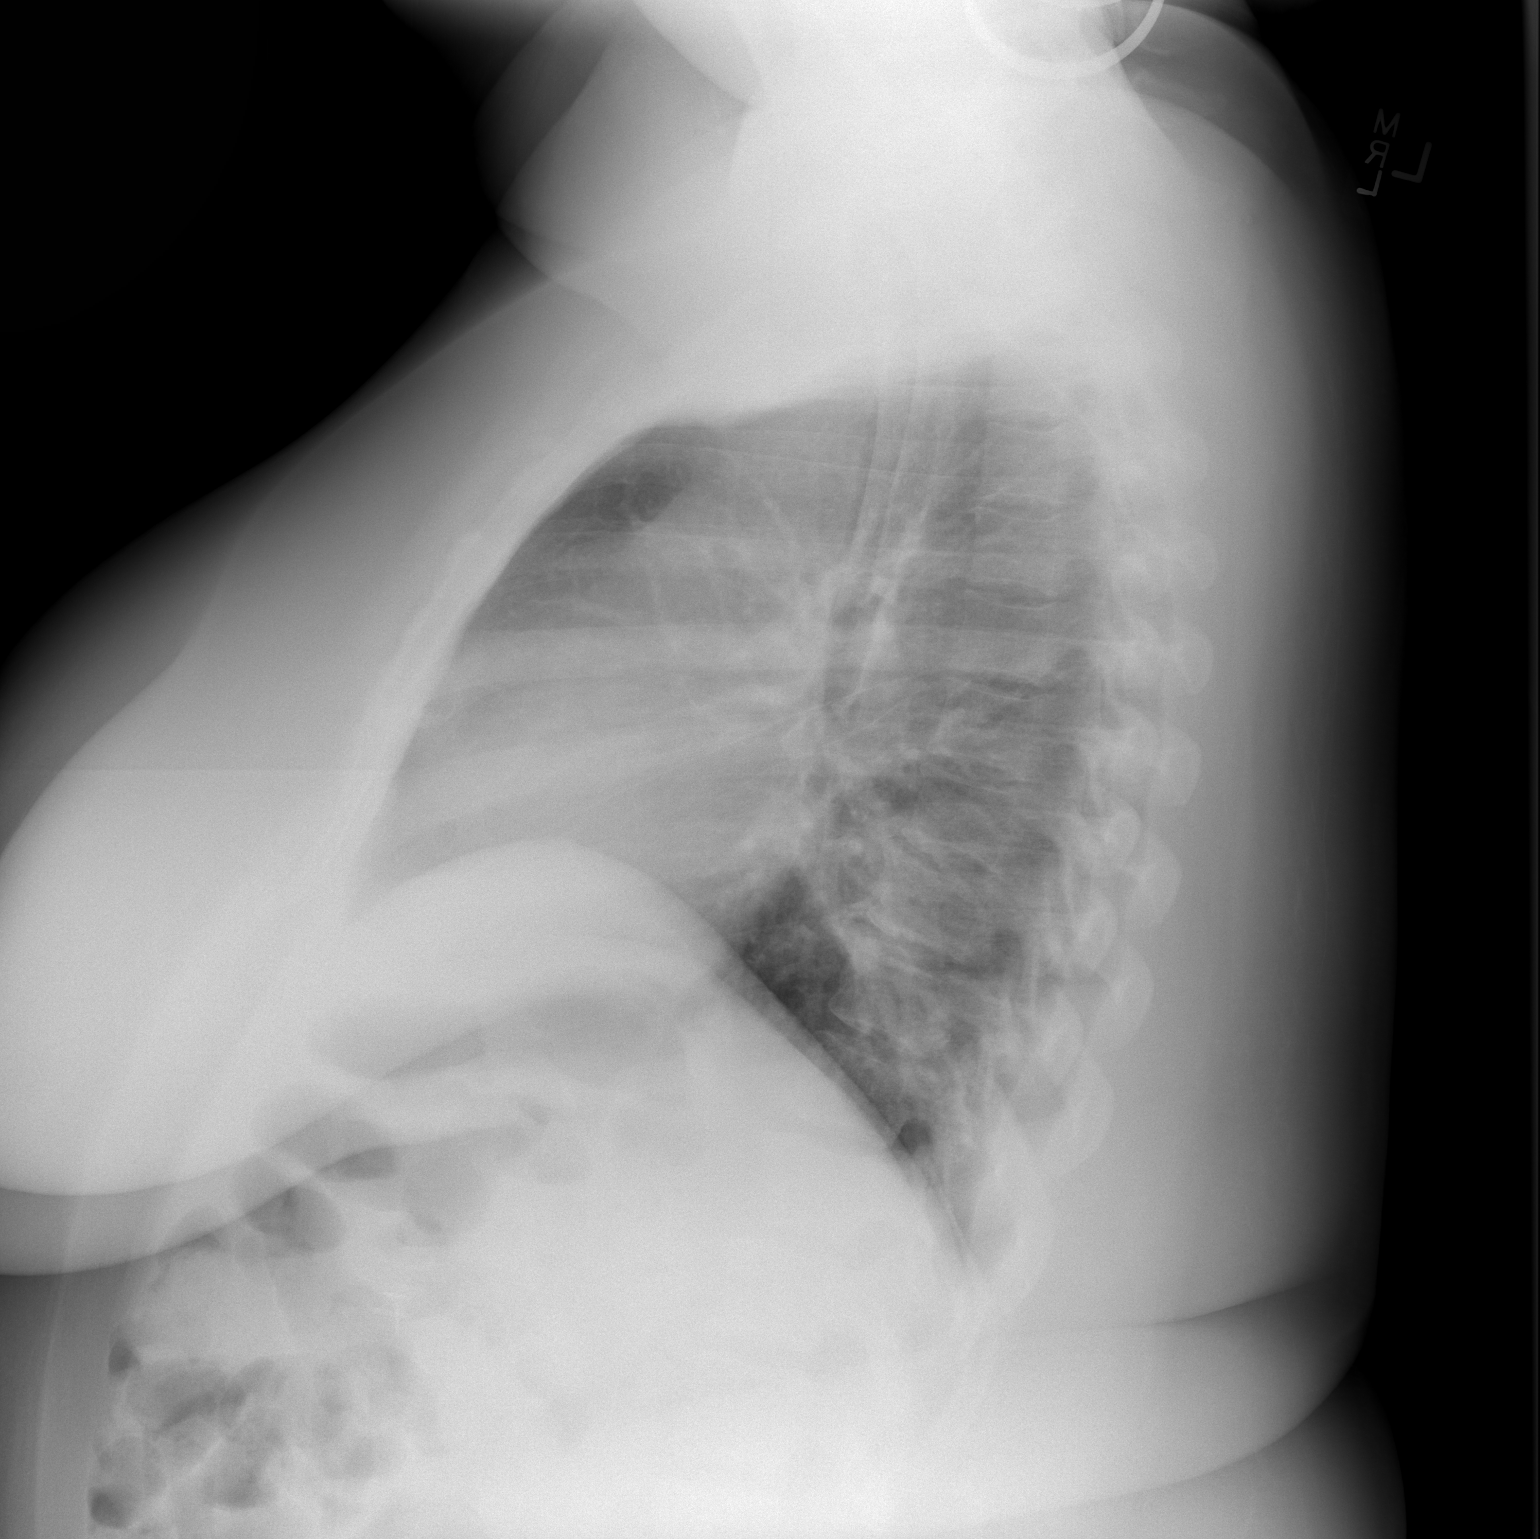

[2 of 2 positions shown; findings below may reference images not displayed]

FINDINGS: Heart and mediastinal contours are within normal limits.
The lung fields are clear with no signs of focal infiltrate or
congestive failure.  No pleural fluid or significant peribronchial
cuffing is seen.

Bony structures demonstrate degenerative change of the lower
thoracic spine and are otherwise intact.
IMPRESSION: No worrisome focal or acute cardiopulmonary abnormality identified

## 2013-02-17 ENCOUNTER — Emergency Department (HOSPITAL_COMMUNITY)
Admission: EM | Admit: 2013-02-17 | Discharge: 2013-02-17 | Disposition: A | Discharge status: Home / Self Care | Payer: No Typology Code available for payment source | Source: Home / Self Care

## 2013-02-17 ENCOUNTER — Encounter (HOSPITAL_COMMUNITY): Payer: Self-pay | Admitting: *Deleted

## 2013-02-17 DIAGNOSIS — J020 Streptococcal pharyngitis: Secondary | ICD-10-CM

## 2013-02-17 LAB — POCT RAPID STREP A: Streptococcus, Group A Screen (Direct): POSITIVE — AB

## 2013-02-17 MED ORDER — AMOXICILLIN 500 MG PO CAPS: 500.0000 mg | ORAL_CAPSULE | Freq: Three times a day (TID) | ORAL | Status: DC

## 2013-02-17 NOTE — ED Provider Notes (Signed)
Cynthia Becker is a 42 y.o. female who presents to Urgent Care today for sore throat and nasal congestion starting since Saturday. Patient has tried nasal decongestions and saltwater gargles along with her chronic Nasonex and Qvar.  She notes the pain is worse with swallowing. She denies any fevers chills nausea vomiting or diarrhea. She feels well otherwise. No sick contacts. Symptoms consistent with prior episodes of strep throat.   PMH reviewed. Asthma, hypertension, GERD, depression History  Substance Use Topics  . Smoking status: Current Some Day Smoker -- 0.50 packs/day    Types: Cigarettes  . Smokeless tobacco: Never Used  . Alcohol Use: 0.0 oz/week     Comment: rare   ROS as above Medications reviewed. No current facility-administered medications for this encounter.   Current Outpatient Prescriptions  Medication Sig Dispense Refill  . albuterol (PROVENTIL HFA;VENTOLIN HFA) 108 (90 BASE) MCG/ACT inhaler Inhale 2 puffs into the lungs every 6 (six) hours as needed. For shortness of breath      . amoxicillin (AMOXIL) 500 MG capsule Take 1 capsule (500 mg total) by mouth 3 (three) times daily.  21 capsule  0  . beclomethasone (QVAR) 40 MCG/ACT inhaler Inhale 2 puffs into the lungs 2 (two) times daily.      Marland Kitchen HYDROcodone-acetaminophen (NORCO/VICODIN) 5-325 MG per tablet Take 1 tablet by mouth every 6 (six) hours as needed for pain.  20 tablet  0  . lisinopril (PRINIVIL,ZESTRIL) 20 MG tablet Take 20 mg by mouth daily.      . mometasone (NASONEX) 50 MCG/ACT nasal spray Place 2 sprays into the nose daily as needed. For congestion  17 g  0    Exam:  BP 126/90  Pulse 92  Temp(Src) 99.7 F (37.6 C) (Oral)  Resp 18  SpO2 98%  LMP 01/27/2013 Gen: Well NAD HEENT: EOMI,  MMM, posterior pharynx is erythematous with exudates. Tympanic membranes are normal appearing bilaterally. Bilateral tender anterior cervical lymphadenopathy. Lungs: CTABL Nl WOB Heart: RRR no MRG Abd: NABS, NT, ND Exts:  Non edematous BL  LE, warm and well perfused.   Results for orders placed during the hospital encounter of 02/17/13 (from the past 24 hour(s))  POCT RAPID STREP A ([facility] CARE ONLY)     Status: Abnormal   Collection Time    02/17/13  3:19 PM      Result Value Range   Streptococcus, Group A Screen (Direct) POSITIVE (*) NEGATIVE   No results found.  Assessment and Plan: 42 y.o. female with strep throat.  Plan to treat with amoxicillin Followup with primary care provider if not improving.  Discussed warning signs or symptoms. Please see discharge instructions. Patient expresses understanding.      Rodolph Bong, MD 02/17/13 [phone]

## 2013-02-17 NOTE — ED Notes (Signed)
Pt reports   Symptoms  Of     sorethroat  Earache  As  Well as  A  Cough   X  3  Days         Pt  Reports   Has  Had  Strep  Throat  In the  Past       She  ambulated  To  Room  With a  Steady  Fluid  Gait        Sitting  Upright on the  Exam table  Speaking in  Complete  sentances

## 2013-03-29 ENCOUNTER — Telehealth: Payer: Self-pay | Admitting: Oncology

## 2013-03-29 NOTE — Telephone Encounter (Signed)
S/W PT AND GVE NP APPT 10/15 @ 3 W/DR. LIVESAY REFERRING K. [facility] DX-IDA/HGB CTRAIT/THROMBOCYTOSIS WELCOME PACKET MAILED.

## 2013-03-29 NOTE — Telephone Encounter (Signed)
C/D 03/29/13 for appt. 04/07/13 °

## 2013-04-07 ENCOUNTER — Ambulatory Visit: Payer: No Typology Code available for payment source

## 2013-04-07 ENCOUNTER — Ambulatory Visit (HOSPITAL_BASED_OUTPATIENT_CLINIC_OR_DEPARTMENT_OTHER): Payer: No Typology Code available for payment source | Admitting: Oncology

## 2013-04-07 ENCOUNTER — Encounter: Payer: Self-pay | Admitting: Oncology

## 2013-04-07 ENCOUNTER — Other Ambulatory Visit: Payer: Self-pay | Admitting: Oncology

## 2013-04-07 ENCOUNTER — Other Ambulatory Visit (HOSPITAL_BASED_OUTPATIENT_CLINIC_OR_DEPARTMENT_OTHER): Payer: No Typology Code available for payment source | Admitting: Lab

## 2013-04-07 ENCOUNTER — Other Ambulatory Visit: Payer: Self-pay

## 2013-04-07 ENCOUNTER — Telehealth: Payer: Self-pay | Admitting: Oncology

## 2013-04-07 VITALS — BP 147/97 | HR 86 | Temp 99.2°F | Resp 18 | Ht 65.0 in | Wt 311.0 lb

## 2013-04-07 DIAGNOSIS — R5381 Other malaise: Secondary | ICD-10-CM

## 2013-04-07 DIAGNOSIS — R5383 Other fatigue: Secondary | ICD-10-CM

## 2013-04-07 DIAGNOSIS — N924 Excessive bleeding in the premenopausal period: Secondary | ICD-10-CM

## 2013-04-07 DIAGNOSIS — E669 Obesity, unspecified: Secondary | ICD-10-CM

## 2013-04-07 DIAGNOSIS — D500 Iron deficiency anemia secondary to blood loss (chronic): Secondary | ICD-10-CM

## 2013-04-07 DIAGNOSIS — F17200 Nicotine dependence, unspecified, uncomplicated: Secondary | ICD-10-CM

## 2013-04-07 DIAGNOSIS — D473 Essential (hemorrhagic) thrombocythemia: Secondary | ICD-10-CM

## 2013-04-07 DIAGNOSIS — R635 Abnormal weight gain: Secondary | ICD-10-CM

## 2013-04-07 DIAGNOSIS — N920 Excessive and frequent menstruation with regular cycle: Secondary | ICD-10-CM

## 2013-04-07 DIAGNOSIS — D509 Iron deficiency anemia, unspecified: Secondary | ICD-10-CM

## 2013-04-07 DIAGNOSIS — D649 Anemia, unspecified: Secondary | ICD-10-CM

## 2013-04-07 DIAGNOSIS — D582 Other hemoglobinopathies: Secondary | ICD-10-CM

## 2013-04-07 DIAGNOSIS — K5900 Constipation, unspecified: Secondary | ICD-10-CM

## 2013-04-07 LAB — CBC & DIFF AND RETIC
BASO%: 1.4 % (ref 0.0–2.0)
Basophils Absolute: 0.1 10e3/uL (ref 0.0–0.1)
EOS%: 8.2 % — ABNORMAL HIGH (ref 0.0–7.0)
Eosinophils Absolute: 0.6 10e3/uL — ABNORMAL HIGH (ref 0.0–0.5)
HCT: 29.6 % — ABNORMAL LOW (ref 34.8–46.6)
HGB: 8.8 g/dL — ABNORMAL LOW (ref 11.6–15.9)
Immature Retic Fract: 25.5 % — ABNORMAL HIGH (ref 1.60–10.00)
LYMPH%: 42.1 % (ref 14.0–49.7)
MCH: 15.4 pg — ABNORMAL LOW (ref 25.1–34.0)
MCHC: 29.7 g/dL — ABNORMAL LOW (ref 31.5–36.0)
MCV: 51.9 fL — ABNORMAL LOW (ref 79.5–101.0)
MONO#: 0.6 10e3/uL (ref 0.1–0.9)
MONO%: 8.2 % (ref 0.0–14.0)
NEUT#: 2.8 10e3/uL (ref 1.5–6.5)
NEUT%: 40.1 % (ref 38.4–76.8)
Platelets: 612 10e3/uL — ABNORMAL HIGH (ref 145–400)
RBC: 5.7 10e6/uL — ABNORMAL HIGH (ref 3.70–5.45)
RDW: 20.8 % — ABNORMAL HIGH (ref 11.2–14.5)
Retic %: 1.67 % (ref 0.70–2.10)
Retic Ct Abs: 95.19 10e3/uL — ABNORMAL HIGH (ref 33.70–90.70)
WBC: 7.1 10e3/uL (ref 3.9–10.3)
lymph#: 3 10e3/uL (ref 0.9–3.3)

## 2013-04-07 LAB — COMPREHENSIVE METABOLIC PANEL (CC13)
ALT: 17 U/L (ref 0–55)
Anion Gap: 7 mEq/L (ref 3–11)
BUN: 11.5 mg/dL (ref 7.0–26.0)
CO2: 23 mEq/L (ref 22–29)
Creatinine: 0.8 mg/dL (ref 0.6–1.1)
Total Bilirubin: 0.21 mg/dL (ref 0.20–1.20)

## 2013-04-07 LAB — MORPHOLOGY: PLT EST: INCREASED

## 2013-04-07 LAB — FERRITIN CHCC: Ferritin: 3 ng/ml — ABNORMAL LOW (ref 9–269)

## 2013-04-07 MED ORDER — HEMOCYTE 324 (106 FE) MG PO TABS: ORAL_TABLET | ORAL | Status: DC

## 2013-04-07 NOTE — Progress Notes (Signed)
Checked in new pt with no financial concerns. °

## 2013-04-07 NOTE — Progress Notes (Signed)
[facility] NEW PATIENT EVALUATION   Name: Deonne Rooks Date: 04/07/2013 MRN: [id number] DOB: [birth year]  REFERRING PHYSICIAN:  Augustine Radar, FNP ([facility])    REASON FOR REFERRAL: iron deficiency anemia and thrombocytosis   HISTORY OF PRESENT ILLNESS:Paizlie Clapp is a 42 y.o. female who is seen in consultation, alone for visit, at the request of  NP with [facility], due to iron deficiency anemia and elevated platelet count. History is from records from [facility] and patient. Patient was seen at [facility] on 01-04-2013 for problems including fatigue and heavy menses. CBC had WBC 6.2, Hgb 8.1, MCV 51.3, plt 752, diff normal except 7% eos, iron 11, %sat 3, and hemoglobin electrophoresis with Hgb A 69, F 0, A2  2.1 and C 28.7. Sickle solubility test negative. 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 She was begun on ferrous sulfate po in August, which she has been taking with meals once daily and is not tolerating well due to constipation and nausea. Repeat CBC on 03-05-13 had WBC 6.8, Hgb 9.2, MCV 56 and plt 731k., automated diff ok except eos 7%. Patient recalls having been anemic x years, last on oral iron 2 years ago. For past 6-7 months her menstrual periods remain regular but have been much heavier than usual for at least 5 days, and additional bleeding for ~ 3 days. She uses at least 18 super tampons during first 2 days of each period. She is not aware of other bleeding. She has been noticeably fatigued with even nonstrenuous activity. She denies chest pain or clear cardiac symptoms, does have some "chest heaviness" which improves with asthma medications. She has chronic constipation, has never had colonoscopy. She has had intentional weight loss of 30 lbs in past 6-7 months, had gained ~ 100 lbs in year prior. She has never been transfused.    REVIEW OF SYSTEMS as above, also: No recent  infectious illness. Frequent HA in past 2 months for which she uses ibuprofen 800 mg often daily. Wears reading glasses but has not had eyes examined recently. Does have allergic sinus symptoms. No active dental problems. Does not know that she has had thyroid checked. Has not had mammograms, no changes noted in breasts. No GERD. Chronic constipation, bowels move 1-2x weekly with mag citrate. Bladder ok. Arthritis symptoms in hands, right knee, elbows. Puritis on ankles recently. Asthma symptoms, no productive cough. Weight has been as high as 300 lbs, usually at least >200 x years.  Remainder of full 10 point review of systems negative.   ALLERGIES: Review of patient's allergies indicates no known allergies.  PAST MEDICAL/ SURGICAL HISTORY:    Asthma x 10 years Anemia  CURRENT MEDICATIONS: reviewed as listed now in EMR  PHARMACY [facility] [address]   SOCIAL HISTORY: originally from [place], traveled with father in [employer], in [place] x 20 years. Married. 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 Has smoked up to 4 cigarettes daily for past 4 years. No ETOH. No transfusions Sons ages 3 and 2, daughter age 67 all live locally.  FAMILY HISTORY:  Mother with diabetes Paternal grandfather colon ca Grandmother lung cancer Sickle cell trait in family Children healthy          PHYSICAL EXAM:  height is 5\' 5"  (1.651 m) and weight is 311 lb (141.069 kg). Her oral temperature is 99.2 F (37.3 C). Her blood pressure is 147/97 and her pulse is 86. Her  respiration is 18 and oxygen saturation is 100%.  Alert, pleasant, cooperative. Obese, NAD with activity in exam room, good historian.  HEENT: PERRL, not icteric. Oral mucosa and posterior pharynx moist and clear. Mucous membranes pale. No obvious dental problems. Neck supple without JVD or thyroid mass.   RESPIRATORY:lungs clear to A and P.   CARDIAC/ VASCULAR: RRR without gallop or murmur. Clear heart sounds. Peripheral pulses  intact  ABDOMEN:obese, soft, nontender, quiet, no appreciable HSM or mass.  LYMPH NODES: no cervical, supraclavicular, axillary or inguinal adenopathy  BREASTS:without dominant mass, skin or nipple findings. Axillae benign  NEUROLOGIC/ PSYCH: no focal deficits and as above  SKIN: no rash, ecchymoses or petechiae except excoriations at ankles bilaterally, no suprainfection apparent  MUSCULOSKELETAL: no pitting edema, cords, tenderness LE. Back nontender. No nodules at UE joints including hands and elbows. No clear swelling joints, no erythema or heat.    LABORATORY DATA:  Results for orders placed in visit on 04/07/13 (from the past 48 hour(s))  CBC & DIFF AND RETIC     Status: Abnormal   Collection Time    04/07/13  3:07 PM      Result Value Range   WBC 7.1  3.9 - 10.3 10e3/uL   NEUT# 2.8  1.5 - 6.5 10e3/uL   HGB 8.8 (*) 11.6 - 15.9 g/dL   HCT 29.5 (*) 62.1 - 30.8 %   Platelets 612 (*) 145 - 400 10e3/uL   MCV 51.9 (*) 79.5 - 101.0 fL   MCH 15.4 (*) 25.1 - 34.0 pg   MCHC 29.7 (*) 31.5 - 36.0 g/dL   RBC 6.57 (*) 8.46 - 9.62 10e6/uL   RDW 20.8 (*) 11.2 - 14.5 %   lymph# 3.0  0.9 - 3.3 10e3/uL   MONO# 0.6  0.1 - 0.9 10e3/uL   Eosinophils Absolute 0.6 (*) 0.0 - 0.5 10e3/uL   Basophils Absolute 0.1  0.0 - 0.1 10e3/uL   NEUT% 40.1  38.4 - 76.8 %   LYMPH% 42.1  14.0 - 49.7 %   MONO% 8.2  0.0 - 14.0 %   EOS% 8.2 (*) 0.0 - 7.0 %   BASO% 1.4  0.0 - 2.0 %   Retic % 1.67  0.70 - 2.10 %   Retic Ct Abs 95.19 (*) 33.70 - 90.70 10e3/uL   Immature Retic Fract 25.50 (*) 1.60 - 10.00 %  FERRITIN [facility]     Status: Abnormal   Collection Time    04/07/13  3:07 PM      Result Value Range   Ferritin 3 (*) 9 - 269 ng/ml  MORPHOLOGY     Status: None   Collection Time    04/07/13  3:07 PM      Result Value Range   Polychromasia Slight  Slight   Tear Drop Cells Few  Negative   Shistocytes Occ  Negative   Target Cells Many  Negative   Spherocytes Few  Negative   White Cell Comments C/W  auto diff     PLT EST Increased  Adequate   Platelet Morphology Large and giant platelets  Within Normal Limits  COMPREHENSIVE METABOLIC PANEL ([facility]13)     Status: None   Collection Time    04/07/13  3:08 PM      Result Value Range   Sodium 137  136 - 145 mEq/L   Potassium 3.8  3.5 - 5.1 mEq/L   Chloride 107  98 - 109 mEq/L   CO2 23  22 - 29  mEq/L   Glucose 96  70 - 140 mg/dl   BUN 16.1  7.0 - 09.6 mg/dL   Creatinine 0.8  0.6 - 1.1 mg/dL   Total Bilirubin 0.45  0.20 - 1.20 mg/dL   Alkaline Phosphatase 64  40 - 150 U/L   AST 16  5 - 34 U/L   ALT 17  0 - 55 U/L   Total Protein 8.1  6.4 - 8.3 g/dL   Albumin 3.5  3.5 - 5.0 g/dL   Calcium 9.2  8.4 - 40.9 mg/dL   Anion Gap 7  3 - 11 mEq/L     See hemoglobin electrophoresis information above  PATHOLOGY: none  RADIOGRAPHY: No results found.     DISCUSSION: We have discussed iron deficiency related to extremely heavy menses, including difficulty repleting iron with ongoing blood loss. We have discussed optimal oral iron regimen, with ferrous fumarate or ferrous gluconate usually better tolerated than ferrous sulfate, best absorbed on empty stomach with OJ or vitamin C. I have mentioned IV iron if oral is not tolerated or not sufficient. I have recommended referral to gyn, as she needs exam (particularly with abnormally heavy bleeding in this obese lady) and hopefully intervention to decrease the amount of blood loss. I have asked her not to use ASA or NSAID including the ibuprofen, as these will interfere with platelets and can also increase bleeding. The elevated platelet count may be all reactive with severe iron deficiency, tho morphology reports large and giant platelets, which we will have to follow as iron is repleted.      IMPRESSION / PLAN: 1.severe iron deficiency anemia related to heavy menstrual blood loss: will try ferrous fumarate initially once daily on empty stomach with OJ, and increase to twice daily if tolerates. Stop  NSAIDs. Refer to gyn clinic at [facility] or other. I will see her back in ~ 3 weeks. May need IV iron additionally. 2.thrombocytosis: may be due just to severe iron deficiency. Follow with iron therapy. 3.obesity: increases risk for cancers including endometrial. Discussed. She is trying to diet and has lost ~ 30 lbs.  4.chronic constipation. With this and unintentional weight gain, TSH sent. 5.tobacco abuse: minimal but needs to stop. Discussed. 6. Hx asthma 7.complaints of arthritis: have not done ANA etc as etiology of anemia seems clearly iron deficiency  8.Hemoglobin C trait     Patient had questions answered to her satisfaction and is in agreement with plan above. She can contact this office for questions or concerns at any time prior to next scheduled visit.  Time spent  40 min, including >50% discussion and coordination of care.    LIVESAY,LENNIS P, MD 04/07/2013 7:58 PM

## 2013-04-07 NOTE — Telephone Encounter (Signed)
gv and printed appt sched and avs forpt for NOV...will call  pt with appt at [facility] open M-Thur until 4pm

## 2013-04-07 NOTE — Patient Instructions (Signed)
Ferrous fumarate: we will call in as prescription [facility] and will also tell pharmacist to give you the correct over the counter iron if we cannot get it as prescription.  This should be much easier on your stomach than ferrous sulfate.  Take this new iron on empty stomach with orange juice (one hour before or 2 hours after a meal). Start once daily and if you do well after a few days increase to twice daily.

## 2013-04-08 ENCOUNTER — Telehealth: Payer: Self-pay | Admitting: Oncology

## 2013-04-08 NOTE — Telephone Encounter (Signed)
lvm for pt Per Erie Noe at [facility] the referral department should be WOC.I sent email to Dr. Cleophas Dunker to change and they will contact pt with appt.

## 2013-04-09 DIAGNOSIS — D582 Other hemoglobinopathies: Secondary | ICD-10-CM | POA: Insufficient documentation

## 2013-04-09 DIAGNOSIS — D500 Iron deficiency anemia secondary to blood loss (chronic): Secondary | ICD-10-CM | POA: Insufficient documentation

## 2013-04-09 DIAGNOSIS — N921 Excessive and frequent menstruation with irregular cycle: Secondary | ICD-10-CM | POA: Insufficient documentation

## 2013-04-24 ENCOUNTER — Other Ambulatory Visit: Payer: Self-pay | Admitting: Oncology

## 2013-04-24 DIAGNOSIS — D649 Anemia, unspecified: Secondary | ICD-10-CM

## 2013-04-24 DIAGNOSIS — D509 Iron deficiency anemia, unspecified: Secondary | ICD-10-CM

## 2013-04-28 ENCOUNTER — Encounter: Payer: Self-pay | Admitting: *Deleted

## 2013-04-28 ENCOUNTER — Telehealth: Payer: Self-pay | Admitting: Oncology

## 2013-04-28 ENCOUNTER — Ambulatory Visit (INDEPENDENT_AMBULATORY_CARE_PROVIDER_SITE_OTHER): Payer: No Typology Code available for payment source | Admitting: Family Medicine

## 2013-04-28 ENCOUNTER — Encounter: Payer: Self-pay | Admitting: Oncology

## 2013-04-28 ENCOUNTER — Other Ambulatory Visit (HOSPITAL_COMMUNITY)
Admission: RE | Admit: 2013-04-28 | Discharge: 2013-04-28 | Disposition: A | Discharge status: Home / Self Care | Payer: No Typology Code available for payment source | Source: Ambulatory Visit | Attending: Obstetrics & Gynecology | Admitting: Obstetrics & Gynecology

## 2013-04-28 ENCOUNTER — Ambulatory Visit (HOSPITAL_BASED_OUTPATIENT_CLINIC_OR_DEPARTMENT_OTHER): Payer: No Typology Code available for payment source | Admitting: Oncology

## 2013-04-28 ENCOUNTER — Telehealth: Payer: Self-pay | Admitting: *Deleted

## 2013-04-28 ENCOUNTER — Encounter: Payer: Self-pay | Admitting: Obstetrics & Gynecology

## 2013-04-28 ENCOUNTER — Other Ambulatory Visit: Payer: Self-pay | Admitting: Oncology

## 2013-04-28 ENCOUNTER — Other Ambulatory Visit (HOSPITAL_BASED_OUTPATIENT_CLINIC_OR_DEPARTMENT_OTHER): Payer: No Typology Code available for payment source | Admitting: Lab

## 2013-04-28 ENCOUNTER — Encounter (INDEPENDENT_AMBULATORY_CARE_PROVIDER_SITE_OTHER): Payer: Self-pay

## 2013-04-28 VITALS — BP 184/99 | HR 81 | Temp 97.9°F | Ht 65.0 in | Wt 313.2 lb

## 2013-04-28 VITALS — BP 137/84 | HR 105 | Temp 98.1°F | Resp 20 | Ht 65.0 in | Wt 315.1 lb

## 2013-04-28 DIAGNOSIS — Z01812 Encounter for preprocedural laboratory examination: Secondary | ICD-10-CM

## 2013-04-28 DIAGNOSIS — D473 Essential (hemorrhagic) thrombocythemia: Secondary | ICD-10-CM

## 2013-04-28 DIAGNOSIS — E669 Obesity, unspecified: Secondary | ICD-10-CM

## 2013-04-28 DIAGNOSIS — N920 Excessive and frequent menstruation with regular cycle: Secondary | ICD-10-CM | POA: Insufficient documentation

## 2013-04-28 DIAGNOSIS — D509 Iron deficiency anemia, unspecified: Secondary | ICD-10-CM

## 2013-04-28 DIAGNOSIS — D649 Anemia, unspecified: Secondary | ICD-10-CM

## 2013-04-28 DIAGNOSIS — M25559 Pain in unspecified hip: Secondary | ICD-10-CM

## 2013-04-28 LAB — CBC WITH DIFFERENTIAL/PLATELET
BASO%: 1.3 % (ref 0.0–2.0)
HCT: 28.9 % — ABNORMAL LOW (ref 34.8–46.6)
LYMPH%: 40.7 % (ref 14.0–49.7)
MCHC: 29.8 g/dL — ABNORMAL LOW (ref 31.5–36.0)
MCV: 51.4 fL — ABNORMAL LOW (ref 79.5–101.0)
MONO#: 0.5 10*3/uL (ref 0.1–0.9)
MONO%: 8.2 % (ref 0.0–14.0)
NEUT%: 41.3 % (ref 38.4–76.8)
Platelets: 507 10*3/uL — ABNORMAL HIGH (ref 145–400)
RBC: 5.62 10*6/uL — ABNORMAL HIGH (ref 3.70–5.45)
nRBC: 1 % — ABNORMAL HIGH (ref 0–0)

## 2013-04-28 LAB — LACTATE DEHYDROGENASE (CC13): LDH: 197 U/L (ref 125–245)

## 2013-04-28 LAB — CHCC SMEAR

## 2013-04-28 NOTE — Telephone Encounter (Signed)
, °

## 2013-04-28 NOTE — Progress Notes (Signed)
Subjective:     Patient ID: Cynthia Becker, female   DOB: 04-03-1971, 42 y.o.   MRN: [id number]  HPI 42 y.o. F here for evaluation for heavy vaginal bleeding. Over the last 90 days worsening periods. The First 2 days - 18 super plus tampons / 24 with bleeding through, and pad below. Then improves over the next 3 days pass clots, final 3 days signicant improvement but present with wipe. (3 boxes / cycle)  Prior to 90d ago, 1 box/cycle. Pt has anemia and been combating for prolonged period of time. Pt currently being seen by Heme onc with plan for IV iron this week. Pt continue oral iron.    Review of Systems +fatigue, + heavy bleeding as above, + occasional light headedness     Objective:   Physical Exam SVE: posterior cervix, 15wk uterus SSE: Posterior cervix. As below.     Assessment:     42 y.o. F w/ menorrhagia based on anemia and history - EMB today - ordered US to evaluate for stripe and fibroids - next step if normal to include mirena vs endometrial ablation     Procedure: Endometrial biopsy Consent: The risks and benefits of the procedure were discussed with the patient. Risks include acute and (very rarely) chronic pain, infection, bleeding (internal and external), failure to diagnose an occult neoplasm, and need for subsequent procedure to further treat findings. Rarely, damage to internal structures such as the uterus may occur requiring subsequent procedures or, exceedingly rare, death. Benefits may include potential diagnosis of uterine pathology. The alternatives were explained to the patient including: not doing procedure or postponing procedure. The disadvantages to not doing the procedure were discussed with the patent, including a missed diagnosis such as a malignancy. The written consent form has been signed and placed in the patient's medical record The patient voiced understanding of the procedure and agreed to proceed. Indication: Vaginal bleeding of uncertain  etiology Physicians: Dr. Jolyn Lent, MD and [facility] Staff Description in detail: A timeout was completed before the start of the procedure. The speculum  was inserted and the cervix was identified. HCG was negative prior to starting. Betadine was used to clean the cervix and an os sound was used.  The endometrial biopsy pipelle was inserted into the uterus and a 360 degree endometrial sample was obtained in the usual manner.  The procedure was repeated to obtain a second sample.  All samples were placed in a specimen container and sent to pathology for review. EBL: Minimal; <32mL Complications: None Instructions to patient: Return to clinic vs. ER, depending on severity, with fevers,  increasing abdominal or pelvic pain, or heavy bleeding. Expect mild soreness and vaginal spotting for one or two days. Call clinic with other questions. Follow up plan: Return to clinic for follow up in one month or sooner depending on pathology.

## 2013-04-28 NOTE — Telephone Encounter (Signed)
Per staff message and POF I have scheduled appts.  JMW  

## 2013-04-28 NOTE — Patient Instructions (Signed)
Go to [facility] now and they will work you in. As you face hospital on [facility] [facility], the clinic building is the center entrance, at [address], says Clinic over entrance.

## 2013-04-28 NOTE — Progress Notes (Signed)
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 She has taken ferrous fumarate with OJ each AM since she was here on 04-07-13, but has been unable to tolerate this bid due to nausea. Last menstrual bleeding began Oct 20 and lasted 9 days, very heavy for 6 days. She is having some minimal spotting now. Periods are every 3.5 weeks or more frequently. She has had no other bleeding. Fatigue is unchanged, no chest pain or other cardiac symptoms.  Gyn appointment is not yet scheduled despite our requests at her last visit at this office and attempts by her PCP from [facility]. While patient was at this office, I have spoken directly with staff at gyn clinic at [facility], who have kindly agreed to work her in to see one of their providers this afternoon. (This MD also spoke directly with [facility] financial counselors re her insurance, and with another [place] associated gyn group prior to obtaining appointment from [facility] clinic)   ONCOLOGIC HISTORY Patient was seen at [facility] on 01-04-2013 for problems including fatigue and heavy menses. CBC had WBC 6.2, Hgb 8.1, MCV 51.3, plt 752, diff normal except 7% eos, iron 11, %sat 3, and hemoglobin electrophoresis with Hgb A 69, F 0, A2 2.1 and C 28.7. Sickle solubility test negative. 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 She was begun on ferrous sulfate po in August, which she has been taking with meals once daily and is not tolerating well due to constipation and nausea. Repeat CBC on 03-05-13 had WBC 6.8, Hgb 9.2, MCV 56 and plt 731k., automated diff ok except eos 7%.  Patient recalls having been anemic x years, last on oral iron 2 years ago. For past 6-7 months her menstrual  periods remain regular but have been much heavier than usual for at least 5 days, and additional bleeding for ~ 3 days. She uses at least 18 super tampons during first 2 days of each period. She is not aware of other bleeding. She has been noticeably fatigued with even nonstrenuous activity.  She has never been transfused.   Review of systems as above, also: No fever or symptoms of infection. Has had vomiting after heavy meals. Feels she is retaining fluid. Joints aching, worse with cold weather, including hands, knees, legs. Remainder of 10 point Review of Systems negative.  Objective:  Vital signs in last 24 hours:  BP 137/84  Pulse 105  Temp(Src) 98.1 F (36.7 C) (Oral)  Resp 20  Ht 5\' 5"  (1.651 m)  Wt 315 lb 1.6 oz (142.928 kg)  BMI 52.44 kg/m2 weight is up 4 lbs.  Alert, oriented and appropriate. Ambulatory without assistance.    HEENT:PERRL, sclerae not icteric. Oral mucosa moist without lesions, posterior pharynx clear. Mucous membranes pale Neck supple. No JVD.  Lymphatics:no cervical,suraclavicular adenopathy Resp: clear to auscultation bilaterally and normal percussion bilaterally Cardio: regular rate and rhythm. No gallop. GI: obese, soft, nontender, no appreciable mass or organomegaly. Normally active bowel sounds.  Musculoskeletal/ Extremities: without pitting edema, cords, tenderness. No joint erythema or heat  Neuro:  nonfocal Skin without rash, ecchymosis, petechiae   Lab Results:  Results for orders placed in visit on 04/28/13  CBC WITH DIFFERENTIAL      Result Value  Range   WBC 6.2  3.9 - 10.3 10e3/uL   NEUT# 2.6  1.5 - 6.5 10e3/uL   HGB 8.6 (*) 11.6 - 15.9 g/dL   HCT 16.1 (*) 09.6 - 04.5 %   Platelets 507 (*) 145 - 400 10e3/uL   MCV 51.4 (*) 79.5 - 101.0 fL   MCH 15.3 (*) 25.1 - 34.0 pg   MCHC 29.8 (*) 31.5 - 36.0 g/dL   RBC 4.09 (*) 8.11 - 9.14 10e6/uL   RDW 20.4 (*) 11.2 - 14.5 %   lymph# 2.5  0.9 - 3.3 10e3/uL   MONO# 0.5  0.1 - 0.9 10e3/uL    Eosinophils Absolute 0.5  0.0 - 0.5 10e3/uL   Basophils Absolute 0.1  0.0 - 0.1 10e3/uL   NEUT% 41.3  38.4 - 76.8 %   LYMPH% 40.7  14.0 - 49.7 %   MONO% 8.2  0.0 - 14.0 %   EOS% 8.5 (*) 0.0 - 7.0 %   BASO% 1.3  0.0 - 2.0 %   nRBC 1 (*) 0 - 0 %  [facility] SMEAR      Result Value Range   Smear Result Smear Available    LACTATE DEHYDROGENASE (CC13)      Result Value Range   LDH 197  125 - 245 U/L    TSH from 04-07-13 WNL at 1.89 Studies/Results:  No results found.  Medications: I have reviewed the patient's current medications. She will continue the oral iron once daily with OJ. We will give IV feraheme this week.  DISCUSSION: Patient agrees with evaluation at [facility] clinic this afternoon, and with IV feraheme later this week at [facility]. She understands that her physicians will continue to address her concerns, with interventions for heavy vaginal bleeding and iron deficiency now.  Assessment/Plan: 1.severe iron deficiency anemia related to heavy menstrual blood loss: IV feraheme this week. Continue ferrous fumarate once daily on empty stomach with OJ as tolerates even after the IV iron due to anticipated ongoing blood loss. Stop NSAIDs. Refer to gyn clinic at [facility], to be seen today (thank you!).   I will see her back in ~ 4 weeks with CBC and ANA. 2.thrombocytosis: may be due just to severe iron deficiency. Follow with iron therapy.  3.obesity: increases risk for cancers including endometrial. Discussed. She is trying to diet and has lost ~ 30 lbs.  4.chronic constipation.   5.tobacco abuse: minimal but needs to stop. Discussed.  6. Hx asthma  7.complaints of arthritis: ANA with my next blood draw if not done before that 8.Hemoglobin C trait    Patient understands instructions and plan above. Time spent including discussion and coordination of care 25 min.    Myrtie Leuthold P, MD   04/28/2013, 2:03 PM

## 2013-04-28 NOTE — Patient Instructions (Signed)
Menorrhagia °Dysfunctional uterine bleeding is different from a normal menstrual period. When periods are heavy or there is more bleeding than is usual for you, it is called menorrhagia. It may be caused by hormonal imbalance, or physical, metabolic, or other problems. Examination is necessary in order that your caregiver may treat treatable causes. If this is a continuing problem, a D&C may be needed. That means that the cervix (the opening of the uterus or womb) is dilated (stretched larger) and the lining of the uterus is scraped out. The tissue scraped out is then examined under a microscope by a specialist (pathologist) to make sure there is nothing of concern that needs further or more extensive treatment. °HOME CARE INSTRUCTIONS  °· If medications were prescribed, take exactly as directed. Do not change or switch medications without consulting your caregiver. °· Long term heavy bleeding may result in iron deficiency. Your caregiver may have prescribed iron pills. They help replace the iron your body lost from heavy bleeding. Take exactly as directed. Iron may cause constipation. If this becomes a problem, increase the bran, fruits, and roughage in your diet. °· Do not take aspirin or medicines that contain aspirin one week before or during your menstrual period. Aspirin may make the bleeding worse. °· If you need to change your sanitary pad or tampon more than once every 2 hours, stay in bed and rest as much as possible until the bleeding stops. °· Eat well-balanced meals. Eat foods high in iron. Examples are leafy green vegetables, meat, liver, eggs, and whole grain breads and cereals. Do not try to lose weight until the abnormal bleeding has stopped and your blood iron level is back to normal. °SEEK MEDICAL CARE IF:  °· You need to change your sanitary pad or tampon more than once an hour. °· You develop nausea (feeling sick to your stomach) and vomiting, dizziness, or diarrhea while you are taking your  medicine. °· You have any problems that may be related to the medicine you are taking. °SEEK IMMEDIATE MEDICAL CARE IF:  °· You have a fever. °· You develop chills. °· You develop severe bleeding or start to pass blood clots. °· You feel dizzy or faint. °MAKE SURE YOU:  °· Understand these instructions. °· Will watch your condition. °· Will get help right away if you are not doing well or get worse. °Document Released: 06/10/2005 Document Revised: 09/02/2011 Document Reviewed: 11/29/2012 °ExitCare® Patient Information ©2014 [employer]. ° °

## 2013-04-29 ENCOUNTER — Ambulatory Visit (HOSPITAL_COMMUNITY)
Admission: RE | Admit: 2013-04-29 | Discharge: 2013-04-29 | Disposition: A | Discharge status: Home / Self Care | Payer: No Typology Code available for payment source | Source: Ambulatory Visit | Attending: Family Medicine | Admitting: Family Medicine

## 2013-04-29 ENCOUNTER — Other Ambulatory Visit: Payer: Self-pay | Admitting: Family Medicine

## 2013-04-29 DIAGNOSIS — D259 Leiomyoma of uterus, unspecified: Secondary | ICD-10-CM | POA: Insufficient documentation

## 2013-04-29 DIAGNOSIS — N920 Excessive and frequent menstruation with regular cycle: Secondary | ICD-10-CM

## 2013-04-30 ENCOUNTER — Telehealth: Payer: Self-pay | Admitting: Oncology

## 2013-04-30 ENCOUNTER — Ambulatory Visit (HOSPITAL_BASED_OUTPATIENT_CLINIC_OR_DEPARTMENT_OTHER): Payer: No Typology Code available for payment source

## 2013-04-30 DIAGNOSIS — D509 Iron deficiency anemia, unspecified: Secondary | ICD-10-CM

## 2013-04-30 MED ORDER — SODIUM CHLORIDE 0.9 % IV SOLN
1020.0000 mg | Freq: Once | INTRAVENOUS | Status: AC
  Administered 2013-04-30: 1020 mg via INTRAVENOUS
  Filled 2013-04-30: qty 34

## 2013-04-30 MED ORDER — SODIUM CHLORIDE 0.9 % IV SOLN
INTRAVENOUS | Status: DC
  Administered 2013-04-30: 15:00:00 via INTRAVENOUS

## 2013-04-30 NOTE — Telephone Encounter (Signed)
, °

## 2013-05-04 ENCOUNTER — Encounter: Payer: Self-pay | Admitting: Family Medicine

## 2013-05-19 ENCOUNTER — Ambulatory Visit: Payer: No Typology Code available for payment source | Admitting: Family Medicine

## 2013-05-24 ENCOUNTER — Ambulatory Visit (INDEPENDENT_AMBULATORY_CARE_PROVIDER_SITE_OTHER): Payer: Self-pay | Admitting: Obstetrics and Gynecology

## 2013-05-24 VITALS — BP 142/90 | HR 85 | Temp 97.9°F | Ht 65.0 in | Wt 317.0 lb

## 2013-05-24 DIAGNOSIS — Z712 Person consulting for explanation of examination or test findings: Secondary | ICD-10-CM

## 2013-05-24 DIAGNOSIS — Z7189 Other specified counseling: Secondary | ICD-10-CM

## 2013-05-24 DIAGNOSIS — N920 Excessive and frequent menstruation with regular cycle: Secondary | ICD-10-CM

## 2013-05-24 NOTE — Patient Instructions (Signed)
Hysterectomy Information  A hysterectomy is a procedure where your uterus is surgically removed. It will no longer be possible to have menstrual periods or to become pregnant. The tubes and ovaries can be removed (bilateral salpingo-oopherectomy) during this surgery as well.  REASONS FOR A HYSTERECTOMY  Persistent, abnormal bleeding.  Lasting (chronic) pelvic pain or infection.  The lining of the uterus (endometrium) starts growing outside the uterus (endometriosis).  The endometrium starts growing in the muscle of the uterus (adenomyosis).  The uterus falls down into the vagina (pelvic organ prolapse).  Symptomatic uterine fibroids.  Precancerous cells.  Cervical cancer or uterine cancer. TYPES OF HYSTERECTOMIES  Supracervical hysterectomy. This type removes the top part of the uterus, but not the cervix.  Total hysterectomy. This type removes the uterus and cervix.  Radical hysterectomy. This type removes the uterus, cervix, and the fibrous tissue that holds the uterus in place in the pelvis (parametrium). WAYS A HYSTERECTOMY CAN BE PERFORMED  Abdominal hysterectomy. A large surgical cut (incision) is made in the abdomen. The uterus is removed through this incision.  Vaginal hysterectomy. An incision is made in the vagina. The uterus is removed through this incision. There are no abdominal incisions.  Conventional laparoscopic hysterectomy. A thin, lighted tube with a camera (laparoscope) is inserted into 3 or 4 small incisions in the abdomen. The uterus is cut into small pieces. The small pieces are removed through the incisions, or they are removed through the vagina.  Laparoscopic assisted vaginal hysterectomy (LAVH). Three or four small incisions are made in the abdomen. Part of the surgery is performed laparoscopically and part vaginally. The uterus is removed through the vagina.  Robot-assisted laparoscopic hysterectomy. A laparoscope is inserted into 3 or 4 small  incisions in the abdomen. A computer-controlled device is used to give the surgeon a 3D image. This allows for more precise movements of surgical instruments. The uterus is cut into small pieces and removed through the incisions or removed through the vagina. RISKS OF HYSTERECTOMY   Bleeding and risk of blood transfusion. Tell your caregiver if you do not want to receive any blood products.  Blood clots in the legs or lung.  Infection.  Injury to surrounding organs.  Anesthesia problems or side effects.  Conversion to an abdominal hysterectomy. WHAT TO EXPECT AFTER A HYSTERECTOMY  You will be given pain medicine.  You will need to have someone with you for the first 3 to 5 days after you go home.  You will need to follow up with your surgeon in 2 to 4 weeks after surgery to evaluate your progress.  You may have early menopause symptoms like hot flashes, night sweats, and insomnia.  If you had a hysterectomy for a problem that was not a cancer or a condition that could lead to cancer, then you no longer need Pap tests. However, even if you no longer need a Pap test, a regular exam is a good idea to make sure no other problems are starting. Document Released: 12/04/2000 Document Revised: 09/02/2011 Document Reviewed: 01/19/2011 [facility] Patient Information 2014 [place], [place]. Endometrial Ablation Endometrial ablation removes the lining of the uterus (endometrium). It is usually a same-day, outpatient treatment. Ablation helps avoid major surgery, such as surgery to remove the cervix and uterus (hysterectomy). After endometrial ablation, you will have little or no menstrual bleeding and may not be able to have children. However, if you are premenopausal, you will need to use a reliable method of birth control following the procedure  because of the small chance that pregnancy can occur. There are different reasons to have this procedure, which include:  Heavy periods.  Bleeding that is  causing anemia.  Irregular bleeding.  Bleeding fibroids on the lining inside the uterus if they are smaller than 3 centimeters. This procedure should not be done if:  You want children in the future.  You have severe cramps with your menstrual period.  You have precancerous or cancerous cells in your uterus.  You were recently pregnant.  You have gone through menopause.  You have had major surgery on the uterus, such as a cesarean delivery. LET [facility] CARE PROVIDER KNOW ABOUT:  Any allergies you have.  All medicines you are taking, including vitamins, herbs, eye drops, creams, and over-the-counter medicines.  Previous problems you or members of your family have had with the use of anesthetics.  Any blood disorders you have.  Previous surgeries you have had.  Medical conditions you have. RISKS AND COMPLICATIONS  Generally, this is a safe procedure. However, as with any procedure, complications can occur. Possible complications include:  Perforation of the uterus.  Bleeding.  Infection of the uterus, bladder, or vagina.  Injury to surrounding organs.  An air bubble to the lung (air embolus).  Pregnancy following the procedure.  Failure of the procedure to help the problem, requiring hysterectomy.  Decreased ability to diagnose cancer in the lining of the uterus. BEFORE THE PROCEDURE  The lining of the uterus must be tested to make sure there is no pre-cancerous or cancer cells present.  An ultrasound may be performed to look at the size of the uterus and to check for abnormalities.  Medicines may be given to thin the lining of the uterus. PROCEDURE  During the procedure, your health care provider will use a tool called a resectoscope to help see inside your uterus. There are different ways to remove the lining of your uterus.   Radiofrequency  This method uses a radiofrequency-alternating electric current to remove the lining of the uterus.  Cryotherapy  This method uses extreme cold to freeze the lining of the uterus.  Heated-Free Liquid  This method uses heated salt (saline) solution to remove the lining of the uterus.  Microwave This method uses high-energy microwaves to heat up the lining of the uterus to remove it.  Thermal balloon  This method involves inserting a catheter with a balloon tip into the uterus. The balloon tip is filled with heated fluid to remove the lining of the uterus. AFTER THE PROCEDURE  After your procedure, do not have sexual intercourse or insert anything into your vagina until permitted by your health care provider. After the procedure, you may experience:  Cramps.  Vaginal discharge.  Frequent urination. Document Released: 04/19/2004 Document Revised: 02/10/2013 Document Reviewed: 11/11/2012 [facility] Patient Information 2014 [place], [place]. Levonorgestrel intrauterine device (IUD) What is this medicine? LEVONORGESTREL IUD (LEE voe nor jes trel) is a contraceptive (birth control) device. The device is placed inside the uterus by a healthcare professional. It is used to prevent pregnancy and can also be used to treat heavy bleeding that occurs during your period. Depending on the device, it can be used for 3 to 5 years. This medicine may be used for other purposes; ask your health care provider or pharmacist if you have questions. COMMON BRAND NAME(S): Gretta Cool What should I tell my health care provider before I take this medicine? They need to know if you have any of these conditions: -abnormal  Pap smear -cancer of the breast, uterus, or cervix -diabetes -endometritis -genital or pelvic infection now or in the past -have more than one sexual partner or your partner has more than one partner -heart disease -history of an ectopic or tubal pregnancy -immune system problems -IUD in place -liver disease or tumor -problems with blood clots or take blood-thinners -use intravenous drugs -uterus of  unusual shape -vaginal bleeding that has not been explained -an unusual or allergic reaction to levonorgestrel, other hormones, silicone, or polyethylene, medicines, foods, dyes, or preservatives -pregnant or trying to get pregnant -breast-feeding How should I use this medicine? This device is placed inside the uterus by a health care professional. Talk to your pediatrician regarding the use of this medicine in children. Special care may be needed. Overdosage: If you think you have taken too much of this medicine contact a poison control center or emergency room at once. NOTE: This medicine is only for you. Do not share this medicine with others. What if I miss a dose? This does not apply. What may interact with this medicine? Do not take this medicine with any of the following medications: -amprenavir -bosentan -fosamprenavir This medicine may also interact with the following medications: -aprepitant -barbiturate medicines for inducing sleep or treating seizures -bexarotene -griseofulvin -medicines to treat seizures like carbamazepine, ethotoin, felbamate, oxcarbazepine, phenytoin, topiramate -modafinil -pioglitazone -rifabutin -rifampin -rifapentine -some medicines to treat HIV infection like atazanavir, indinavir, lopinavir, nelfinavir, tipranavir, ritonavir -St. John's wort -warfarin This list may not describe all possible interactions. Give your health care provider a list of all the medicines, herbs, non-prescription drugs, or dietary supplements you use. Also tell them if you smoke, drink alcohol, or use illegal drugs. Some items may interact with your medicine. What should I watch for while using this medicine? Visit your doctor or health care professional for regular check ups. See your doctor if you or your partner has sexual contact with others, becomes HIV positive, or gets a sexual transmitted disease. This product does not protect you against HIV infection (AIDS) or  other sexually transmitted diseases. You can check the placement of the IUD yourself by reaching up to the top of your vagina with clean fingers to feel the threads. Do not pull on the threads. It is a good habit to check placement after each menstrual period. Call your doctor right away if you feel more of the IUD than just the threads or if you cannot feel the threads at all. The IUD may come out by itself. You may become pregnant if the device comes out. If you notice that the IUD has come out use a backup birth control method like condoms and call your health care provider. Using tampons will not change the position of the IUD and are okay to use during your period. What side effects may I notice from receiving this medicine? Side effects that you should report to your doctor or health care professional as soon as possible: -allergic reactions like skin rash, itching or hives, swelling of the face, lips, or tongue -fever, flu-like symptoms -genital sores -high blood pressure -no menstrual period for 6 weeks during use -pain, swelling, warmth in the leg -pelvic pain or tenderness -severe or sudden headache -signs of pregnancy -stomach cramping -sudden shortness of breath -trouble with balance, talking, or walking -unusual vaginal bleeding, discharge -yellowing of the eyes or skin Side effects that usually do not require medical attention (report to your doctor or health care professional if they continue or  are bothersome): -acne -breast pain -change in sex drive or performance -changes in weight -cramping, dizziness, or faintness while the device is being inserted -headache -irregular menstrual bleeding within first 3 to 6 months of use -nausea This list may not describe all possible side effects. Call your doctor for medical advice about side effects. You may report side effects to FDA at 1-800-FDA-1088. Where should I keep my medicine? This does not apply. NOTE: This sheet is a  summary. It may not cover all possible information. If you have questions about this medicine, talk to your doctor, pharmacist, or health care provider.  2014, Elsevier/Gold Standard. (2011-07-11 13:54:04)

## 2013-05-24 NOTE — Progress Notes (Signed)
Patient ID: Cynthia Becker, female   DOB: 1971-05-10, 42 y.o.   MRN: [id number] 42 yo here to discuss results of endometrial biopsy and ultrasound performed in early November. Results were reviewed and explained to the patient. Patient was previously counseled on medical management with Mirena IUD vs endometrial ablation. Patient is more inclined towards trying Mirena IUD.  Scholarship form completed today Patient to return for IUD insertion

## 2013-05-26 ENCOUNTER — Encounter: Payer: Self-pay | Admitting: Oncology

## 2013-05-26 ENCOUNTER — Telehealth: Payer: Self-pay | Admitting: *Deleted

## 2013-05-26 ENCOUNTER — Ambulatory Visit (HOSPITAL_BASED_OUTPATIENT_CLINIC_OR_DEPARTMENT_OTHER): Payer: No Typology Code available for payment source | Admitting: Oncology

## 2013-05-26 ENCOUNTER — Other Ambulatory Visit (HOSPITAL_BASED_OUTPATIENT_CLINIC_OR_DEPARTMENT_OTHER): Payer: No Typology Code available for payment source | Admitting: Lab

## 2013-05-26 VITALS — BP 150/94 | HR 79 | Temp 97.7°F | Resp 20 | Ht 65.0 in | Wt 317.7 lb

## 2013-05-26 DIAGNOSIS — K5900 Constipation, unspecified: Secondary | ICD-10-CM

## 2013-05-26 DIAGNOSIS — D473 Essential (hemorrhagic) thrombocythemia: Secondary | ICD-10-CM

## 2013-05-26 DIAGNOSIS — D509 Iron deficiency anemia, unspecified: Secondary | ICD-10-CM

## 2013-05-26 DIAGNOSIS — M25559 Pain in unspecified hip: Secondary | ICD-10-CM

## 2013-05-26 DIAGNOSIS — E669 Obesity, unspecified: Secondary | ICD-10-CM

## 2013-05-26 DIAGNOSIS — R110 Nausea: Secondary | ICD-10-CM

## 2013-05-26 DIAGNOSIS — N920 Excessive and frequent menstruation with regular cycle: Secondary | ICD-10-CM

## 2013-05-26 LAB — CBC WITH DIFFERENTIAL/PLATELET
Basophils Absolute: 0.1 10*3/uL (ref 0.0–0.1)
EOS%: 9.8 % — ABNORMAL HIGH (ref 0.0–7.0)
Eosinophils Absolute: 0.7 10*3/uL — ABNORMAL HIGH (ref 0.0–0.5)
LYMPH%: 37.2 % (ref 14.0–49.7)
MCH: 18.7 pg — ABNORMAL LOW (ref 25.1–34.0)
MCHC: 31.2 g/dL — ABNORMAL LOW (ref 31.5–36.0)
MCV: 59.8 fL — ABNORMAL LOW (ref 79.5–101.0)
MONO%: 7.7 % (ref 0.0–14.0)
NEUT#: 2.9 10*3/uL (ref 1.5–6.5)
Platelets: 466 10*3/uL — ABNORMAL HIGH (ref 145–400)
RBC: 6.1 10*6/uL — ABNORMAL HIGH (ref 3.70–5.45)
lymph#: 2.5 10*3/uL (ref 0.9–3.3)
nRBC: 0 % (ref 0–0)

## 2013-05-26 NOTE — Patient Instructions (Signed)
Stop iron tablets to see if nausea is better without these  Resume zantac, two tablets daily (for stomach irritation, which can cause nausea)  Stop smoking (smoking causes stomach irritation)  Do hemoccult cards and return to Dr Precious Reel office or Dr Georjean Mode office. One card for each of 3 different bowel movements, just touch the toilet tissue to the spaces after you wipe.  Do these cards when you are not having your period and take to office to be developed within a week of getting the specimen.

## 2013-05-26 NOTE — Progress Notes (Signed)
OFFICE PROGRESS NOTE   05/26/2013   Physicians:Roberson, Foye Clock, FNP and Bobbye Riggs MD ([facility]); Constant, Peggy   INTERVAL HISTORY Patient is seen, together with husband, in continuing attention to microcytic anemia, which is at least primarily related to iron deficiency from heavy menses. She had IV feraheme 1020 mg on 04-30-13, tolerated without difficulty. She continues oral iron (hemocyte) once daily which she feels is exacerbating her underlying significant nausea. She is still fatigued and complains of ongoing nausea, cannot really tell much difference since the IV iron tho hemoglobin has increased up to 11.4 today, compared to 8.6 prior to IV iron. She has also been seen by [facility] gyn clinic Drs Jolyn Lent and Peggy Constant, with transabdominal and transvaginal US 04-29-13 showing multiple fibroids with 3 dominant fibroids up to 4.6 cm, and  endometrial biopsy 04-28-13 which fortunately did not show any atypia, hyperplasia or malignancy. She saw Dr Jolayne Panther in follow up 05-24-13, with plans for Mirena IUD to decrease menstrual bleeding if assistance funds available to allow this. At my request during visit, [facility] financial counselor will follow up with patient also today in case other options for assistance thru this office. Last menstrual period was a week ago, extremely heavy as has been the case. Nausea is mostly continuous, worse with "heavy foods", better at times with crackers. She had previously tried OTC Zantac and will resume that at 2 tablets daily for now. She continues with prn ibuprofen, which I have reminded her to take always with food. Bowels are moving only ~ 3x weekly, chronic constipation. She has no symptoms of UTI. She denies epigastric pain or noted blood in stools. She will check hemoccult cards when not having menstrual bleeding, to return either here or to PCP Dr Wylene Simmer, within a week of obtaining the specimens. She has not had upper  endoscopy or other GI evaluation. She continues to smoke ~ 3 cigarettes daily, tho this is much less than when I met her. We have discussed stopping entirely, possibly also using nicotine gum when DCs.  HEMATOLOGY HISTORY Patient was seen at [facility] on 01-04-2013 for problems including fatigue and heavy menses. CBC had WBC 6.2, Hgb 8.1, MCV 51.3, plt 752, diff normal except 7% eos, iron 11, %sat 3, and hemoglobin electrophoresis with Hgb A 69, F 0, A2 2.1 and C 28.7. Sickle solubility test negative. 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 She was begun on ferrous sulfate po in August, which she has been taking with meals once daily and is not tolerating well due to constipation and nausea. Repeat CBC on 03-05-13 had WBC 6.8, Hgb 9.2, MCV 56 and plt 731k., automated diff ok except eos 7%.  Patient recalls having been anemic x years, last on oral iron 2 years prior to referral to this office. For past ~ 7-8 months her menstrual periods remain regular but have been much heavier than usual for at least 5 days, and additional bleeding for ~ 3 days. She uses at least 18 super tampons during first 2 days of each period. She is not aware of other bleeding. She was noticeably fatigued with even nonstrenuous activity at time of referral to this office..  She has never been transfused.   Review of systems as above, also: No fever or symptoms of infection. No cough or chest pain. Very inactive, which had been with severe anemia symptoms and she feels is now with nausea and knee discomfort (not new).  Remainder of 10 point Review of Systems negative.  Some diet discussion done. She reports drinking lots of apple juice and peach juice tho no sodas, trying to eat salad. I suggested trying to find lower calorie fluids. We have discussed benefits of weight loss for knees.  Objective:  Vital signs in last 24 hours:  BP 150/94  Pulse 79  Temp(Src) 97.7 F (36.5 C) (Oral)   Resp 20  Ht 5\' 5"  (1.651 m)  Wt 317 lb 11.2 oz (144.108 kg)  BMI 52.87 kg/m2  LMP 05/17/2013 Weight is up 2 lbs. Color is improved. Not dyspneic with activity in exam room. Alert, oriented and appropriate. Ambulatory without assistance.    HEENT:PERRL, sclerae not icteric. Oral mucosa moist without lesions, posterior pharynx clear. Mucous membranes less pale. Neck supple. No JVD.  Lymphatics:no cervical,suraclavicular, axillary or inguinal adenopathy Resp: clear to auscultation bilaterally and normal percussion bilaterally Cardio: regular rate and rhythm. No gallop. GI: obese, soft, nontender including epigastrium and RUQ, not obviously distended, no appreciable mass or organomegaly. Diminished bowel sounds.  Musculoskeletal/ Extremities: without pitting edema, cords, tenderness Neuro: nonfocal Skin without rash, ecchymosis, petechiae   Lab Results:  Results for orders placed in visit on 05/26/13  CBC WITH DIFFERENTIAL      Result Value Range   WBC 6.6  3.9 - 10.3 10e3/uL   NEUT# 2.9  1.5 - 6.5 10e3/uL   HGB 11.4 (*) 11.6 - 15.9 g/dL   HCT 40.9  81.1 - 91.4 %   Platelets 466 (*) 145 - 400 10e3/uL   MCV 59.8 (*) 79.5 - 101.0 fL   MCH 18.7 (*) 25.1 - 34.0 pg   MCHC 31.2 (*) 31.5 - 36.0 g/dL   RBC 7.82 (*) 9.56 - 2.13 10e6/uL   RDW 31.2 (*) 11.2 - 14.5 %   lymph# 2.5  0.9 - 3.3 10e3/uL   MONO# 0.5  0.1 - 0.9 10e3/uL   Eosinophils Absolute 0.7 (*) 0.0 - 0.5 10e3/uL   Basophils Absolute 0.1  0.0 - 0.1 10e3/uL   NEUT% 44.5  38.4 - 76.8 %   LYMPH% 37.2  14.0 - 49.7 %   MONO% 7.7  0.0 - 14.0 %   EOS% 9.8 (*) 0.0 - 7.0 %   BASO% 0.8  0.0 - 2.0 %   nRBC 0  0 - 0 %   Note platelets down from 612 prior to IV iron. Note Hgb electrophoresis showed Hgb C trait, initially with question of alpha thal trait vs iron deficiency   Studies/Results: Pathology Patient: Cynthia, Amacher Becker Collected: 04/28/2013 Client: [facility] Accession: [id number] Received:  04/29/2013 Cynthia Scale, MD DOB: 04/19/1971 Age: 42 Gender: F Reported: 04/30/2013 [address] Patient Ph: [phone] MRN #: [id number] [place], [place] [address] Visit #: [id number] Chart #: Phone: [phone] Fax: CC: Willodean Rosenthal, MD REPORT OF SURGICAL PATHOLOGY FINAL DIAGNOSIS Diagnosis Endometrium, biopsy - BENIGN WEAKLY PROLIFERATIVE ENDOMETRIUM, NO ATYPIA, HYPERPLASIA OR MALIGNANCY. - BENIGN ENDOCERVICAL MUCOSA, NO DYSPLASIA OR MALIGNANCY.   Radiology  TRANSABDOMINAL AND TRANSVAGINAL ULTRASOUND OF PELVIS 04-29-13 TECHNIQUE:  Both transabdominal and transvaginal ultrasound examinations of the  pelvis were performed. Transabdominal technique was performed for  global imaging of the pelvis including uterus, ovaries, adnexal  regions, and pelvic cul-de-sac. It was necessary to proceed with  endovaginal exam following the transabdominal exam to visualize the  endometrium.  COMPARISON: None  FINDINGS:  Uterus  Measurements: 14.2 x 8.2 x 7.3 cm. Multiple uterine fibroids. Three  dominant uterine fibroids  include:  --4.6 x 2.7 x 2.6 cm subserosal right anterior uterine fundal  fibroid  --2.8 x 2.1 x 2.9 cm intramural left anterior uterine body fibroid  --2.4 x 1.9 x 2.4 cm intramural right posterior uterine body fibroid  Endometrium  Thickness: 9 mm. Poorly visualized.  Right ovary  Measurements: 3.4 x 1.7 x 2.5 cm. Only visualized transabdominally.  No adnexal mass is seen.  Left ovary  Measurements: 3.3 x 2.1 x 2.0 cm. Only visualized transabdominally.  No adnexal mass is seen.  Other findings  No free fluid.  IMPRESSION:  Multiple uterine fibroids, measuring up to 4.6 cm, as described  above.  Endometrial complex measures 9 mm     Medications: I have reviewed the patient's current medications. She will resume zantac OTC that she has at home, hopefully 2 daily. She will hold oral iron for now due to nausea, tho optimally she should continue  the oral iron to lessen recurrent iron losses from ongoing gyn bleeding.   DISCUSSION: hemoccult cards x 3 given with instructions, to return here or to Dr Wylene Simmer. Diet and weight loss as above. Tobacco cessation as above. I would certainly agree with IUD to decrease uterine bleeding. She needs to schedule back to PCP, may need other GI evaluation if nausea continues. She will call this office if she feels that hemoglobin is dropping significantly prior to next scheduled visit here with cbc and iron in ~ 6 months.   Assessment/Plan:  1.severe iron deficiency anemia related to heavy menstrual blood loss: IV feraheme given full dose 04-30-13, with improvement in Hgb tho still microcytic indices. May need IV iron again in future if gyn bleeding continues excessive, but hopefully will get IUD to improve that. Consider upper endoscopy if nausea continues or if hemoccults positive. Resume zantac. 2.thrombocytosis: may be due just to severe iron deficiency. Follow with iron therapy.  3.obesity: increases risk for cancers including endometrial. Discussed. She is trying to diet and had lost ~ 30 lbs.  4.chronic constipation not addressed so far. May be contributing to nausea. 5.tobacco abuse: minimal but needs to stop. Discussed. Husband supportive of efforts to DC. 6. Hx asthma  7.complaints of arthritis: ANA pending today. 8.Hemoglobin C trait. Microcytosis likely iron deficiency and is some better already, but question of thalassemia trait. Follow CBCs for now to see maximum benefit from IV iron.   Appreciate very prompt assistance from [facility] gyn clinic! Patient and husband understand discussion and are in agreement with plan. She will reschedule with PCP for other issues since the acute anemia is now better understood and interventions in process.    Cynthia Becker P, MD   05/26/2013, 2:25 PM

## 2013-05-26 NOTE — Telephone Encounter (Signed)
appts made and printed...td 

## 2013-05-27 ENCOUNTER — Encounter: Payer: Self-pay | Admitting: *Deleted

## 2013-07-19 ENCOUNTER — Ambulatory Visit (INDEPENDENT_AMBULATORY_CARE_PROVIDER_SITE_OTHER): Payer: No Typology Code available for payment source | Admitting: Obstetrics and Gynecology

## 2013-07-19 ENCOUNTER — Encounter: Payer: Self-pay | Admitting: Obstetrics and Gynecology

## 2013-07-19 VITALS — BP 141/92 | HR 93 | Temp 98.1°F | Ht 65.0 in | Wt 320.8 lb

## 2013-07-19 DIAGNOSIS — Z30430 Encounter for insertion of intrauterine contraceptive device: Secondary | ICD-10-CM

## 2013-07-19 DIAGNOSIS — Z01812 Encounter for preprocedural laboratory examination: Secondary | ICD-10-CM

## 2013-07-19 DIAGNOSIS — N938 Other specified abnormal uterine and vaginal bleeding: Secondary | ICD-10-CM

## 2013-07-19 DIAGNOSIS — N949 Unspecified condition associated with female genital organs and menstrual cycle: Secondary | ICD-10-CM

## 2013-07-19 LAB — POCT PREGNANCY, URINE: Preg Test, Ur: NEGATIVE

## 2013-07-19 MED ORDER — LEVONORGESTREL 20 MCG/24HR IU IUD: 1.0000 | INTRAUTERINE_SYSTEM | Freq: Once | INTRAUTERINE | Status: DC

## 2013-07-19 MED ORDER — LEVONORGESTREL 20 MCG/24HR IU IUD
INTRAUTERINE_SYSTEM | Freq: Once | INTRAUTERINE | Status: AC
  Administered 2013-07-19: 15:00:00 via INTRAUTERINE

## 2013-07-19 NOTE — Addendum Note (Signed)
Addended by: Rutherford Nail E on: 07/19/2013 03:09 PM   Modules accepted: Orders, Medications

## 2013-07-19 NOTE — Progress Notes (Signed)
Patient ID: Cynthia Becker, female   DOB: [birth year], 43 y.o.   MRN: [id number] 43 yo presenting today for IUD insertion for management of abnormal uterine bleeding  IUD Procedure Note Patient identified, informed consent performed, signed copy in chart, time out was performed.  Urine pregnancy test negative.  Speculum placed in the vagina.  Cervix visualized.  Cleaned with Betadine x 2.  Grasped anteriorly with a single tooth tenaculum.  Uterus sounded to 13 cm.  Mirena IUD placed per manufacturer's recommendations.  Strings trimmed to 3 cm. Tenaculum was removed, good hemostasis noted.  Patient tolerated procedure well.   Patient given post procedure instructions and Mirena care card with expiration date.  Patient is asked to check IUD strings periodically and follow up in 4-6 weeks for IUD check.

## 2013-07-19 NOTE — Addendum Note (Signed)
Addended by: Rutherford Nail E on: 07/19/2013 02:49 PM   Modules accepted: Orders

## 2013-07-22 ENCOUNTER — Encounter: Payer: Self-pay | Admitting: *Deleted

## 2013-07-29 ENCOUNTER — Telehealth: Payer: Self-pay

## 2013-07-29 NOTE — Telephone Encounter (Signed)
Pt. Called stating she had an IUD placed 07/19/13 and would like to know she she can use tampons.

## 2013-07-29 NOTE — Telephone Encounter (Signed)
Called pt. And advised her not to use tampons until her follow up appointment as the IUD is still new, strings may get snagged on tampon and IUD could change position or come out. Pt. Verbalized understanding and had no further questions or concerns.

## 2013-07-29 NOTE — Telephone Encounter (Signed)
Opened in error

## 2013-08-25 ENCOUNTER — Other Ambulatory Visit (HOSPITAL_COMMUNITY): Payer: Self-pay | Admitting: Otolaryngology

## 2013-08-26 ENCOUNTER — Ambulatory Visit (INDEPENDENT_AMBULATORY_CARE_PROVIDER_SITE_OTHER): Payer: No Typology Code available for payment source | Admitting: Obstetrics and Gynecology

## 2013-08-26 ENCOUNTER — Encounter: Payer: Self-pay | Admitting: Obstetrics and Gynecology

## 2013-08-26 ENCOUNTER — Encounter (HOSPITAL_COMMUNITY): Payer: Self-pay | Admitting: Pharmacy Technician

## 2013-08-26 VITALS — BP 140/95 | HR 89 | Temp 97.7°F | Ht 63.0 in | Wt 324.0 lb

## 2013-08-26 DIAGNOSIS — Z30431 Encounter for routine checking of intrauterine contraceptive device: Secondary | ICD-10-CM

## 2013-08-26 MED ORDER — MEGESTROL ACETATE 40 MG PO TABS: 40.0000 mg | ORAL_TABLET | Freq: Every day | ORAL | Status: DC

## 2013-08-26 NOTE — Progress Notes (Signed)
Patient ID: Cynthia Becker, female   DOB: 1970/09/14, 43 y.o.   MRN: [id number] 43 yo G3P3 s/p IUD insertion on 07/19/2013 presenting today for IUD check. Patient reports persistent vaginal bleeding since insertion. Some days are lighter but around the time of he period the bleeding increases with occasional clots.   SSE: Normal vaginal mucosa and cervix. IUD strings visualized at os  A/P 43 yo with abnormal uterine bleeding medically managed with Mirena IUD  - IUD appears to be well positioned - Rx megace provided to help with vaginal bleeding - RTC in July is persistent vaginal bleeding for further management

## 2013-09-01 NOTE — Pre-Procedure Instructions (Signed)
Cynthia Becker  09/01/2013   Your procedure is scheduled on:  Wed, Mar 18 @ 1:40 PM  Report to [facility]  at 11:30 AM.  Call this number if you have problems the morning of surgery: [phone]   Remember:   Do not eat food or drink liquids after midnight.   Take these medicines the morning of surgery with A SIP OF WATER: Albuterol<Bring Your Inhaler With You>,Abilify(Aripiprazole),QVAR(Beclomethazone),Claritin(Loratadine if needed),and Brintellix(Vortioxetine)               Stop taking your Ibuprofen. No Goody's,BC's,Aleve,Aspirin,Fish Oil,or any Herbal Medications   Do not wear jewelry, make-up or nail polish.  Do not wear lotions, powders, or perfumes. You may wear deodorant.  Do not shave 48 hours prior to surgery.   Do not bring valuables to the hospital.  [facility] is not responsible                  for any belongings or valuables.               Contacts, dentures or bridgework may not be worn into surgery.  Leave suitcase in the car. After surgery it may be brought to your room.  For patients admitted to the hospital, discharge time is determined by your                treatment team.               Patients discharged the day of surgery will not be allowed to drive  home.    Special Instructions:  [place] - Preparing for Surgery  Before surgery, you can play an important role.  Because skin is not sterile, your skin needs to be as free of germs as possible.  You can reduce the number of germs on you skin by washing with CHG (chlorahexidine gluconate) soap before surgery.  CHG is an antiseptic cleaner which kills germs and bonds with the skin to continue killing germs even after washing.  Please DO NOT use if you have an allergy to CHG or antibacterial soaps.  If your skin becomes reddened/irritated stop using the CHG and inform your nurse when you arrive at Short Stay.  Do not shave (including legs and underarms) for at least 48 hours prior to the first CHG  shower.  You may shave your face.  Please follow these instructions carefully:   1.  Shower with CHG Soap the night before surgery and the                                morning of Surgery.  2.  If you choose to wash your hair, wash your hair first as usual with your       normal shampoo.  3.  After you shampoo, rinse your hair and body thoroughly to remove the                      Shampoo.  4.  Use CHG as you would any other liquid soap.  You can apply chg directly       to the skin and wash gently with scrungie or a clean washcloth.  5.  Apply the CHG Soap to your body ONLY FROM THE NECK DOWN.        Do not use on open wounds or open sores.  Avoid contact with your eyes,  ears, mouth and genitals (private parts).  Wash genitals (private parts)       with your normal soap.  6.  Wash thoroughly, paying special attention to the area where your surgery        will be performed.  7.  Thoroughly rinse your body with warm water from the neck down.  8.  DO NOT shower/wash with your normal soap after using and rinsing off       the CHG Soap.  9.  Pat yourself dry with a clean towel.            10.  Wear clean pajamas.            11.  Place clean sheets on your bed the night of your first shower and do not        sleep with pets.  Day of Surgery  Do not apply any lotions/deoderants the morning of surgery.  Please wear clean clothes to the hospital/surgery center.     Please read over the following fact sheets that you were given: Pain Booklet, Coughing and Deep Breathing and Surgical Site Infection Prevention

## 2013-09-02 ENCOUNTER — Encounter (HOSPITAL_COMMUNITY)
Admission: RE | Admit: 2013-09-02 | Discharge: 2013-09-02 | Disposition: A | Discharge status: Home / Self Care | Payer: No Typology Code available for payment source | Source: Ambulatory Visit | Attending: Anesthesiology | Admitting: Anesthesiology

## 2013-09-02 ENCOUNTER — Encounter (HOSPITAL_COMMUNITY): Payer: Self-pay

## 2013-09-02 ENCOUNTER — Encounter (HOSPITAL_COMMUNITY)
Admission: RE | Admit: 2013-09-02 | Discharge: 2013-09-02 | Disposition: A | Discharge status: Home / Self Care | Payer: No Typology Code available for payment source | Source: Ambulatory Visit | Attending: Otolaryngology | Admitting: Otolaryngology

## 2013-09-02 DIAGNOSIS — Z01812 Encounter for preprocedural laboratory examination: Secondary | ICD-10-CM | POA: Insufficient documentation

## 2013-09-02 DIAGNOSIS — Z01810 Encounter for preprocedural cardiovascular examination: Secondary | ICD-10-CM | POA: Insufficient documentation

## 2013-09-02 DIAGNOSIS — Z01818 Encounter for other preprocedural examination: Secondary | ICD-10-CM | POA: Insufficient documentation

## 2013-09-02 HISTORY — DX: Personal history of other specified conditions: Z87.898

## 2013-09-02 HISTORY — DX: Anxiety disorder, unspecified: F41.9

## 2013-09-02 HISTORY — DX: Unspecified osteoarthritis, unspecified site: M19.90

## 2013-09-02 HISTORY — DX: Anemia, unspecified: D64.9

## 2013-09-02 HISTORY — DX: Headache: R51

## 2013-09-02 LAB — CBC
HEMATOCRIT: 30 % — AB (ref 36.0–46.0)
HEMOGLOBIN: 9.4 g/dL — AB (ref 12.0–15.0)
MCH: 16.8 pg — ABNORMAL LOW (ref 26.0–34.0)
MCHC: 31.3 g/dL (ref 30.0–36.0)
MCV: 53.7 fL — AB (ref 78.0–100.0)
Platelets: 680 10*3/uL — ABNORMAL HIGH (ref 150–400)
RBC: 5.59 MIL/uL — ABNORMAL HIGH (ref 3.87–5.11)
RDW: 20 % — ABNORMAL HIGH (ref 11.5–15.5)
WBC: 7.7 10*3/uL (ref 4.0–10.5)

## 2013-09-02 LAB — BASIC METABOLIC PANEL
BUN: 18 mg/dL (ref 6–23)
CHLORIDE: 100 meq/L (ref 96–112)
CO2: 22 meq/L (ref 19–32)
CREATININE: 0.73 mg/dL (ref 0.50–1.10)
Calcium: 9.4 mg/dL (ref 8.4–10.5)
GFR calc non Af Amer: >90 mL/min (ref >90)
GLUCOSE: 97 mg/dL (ref 70–99)
POTASSIUM: 4.5 meq/L (ref 3.7–5.3)
Sodium: 137 mEq/L (ref 137–147)

## 2013-09-02 LAB — HCG, SERUM, QUALITATIVE: Preg, Serum: NEGATIVE

## 2013-09-02 NOTE — Progress Notes (Signed)
Primary physician - dr. Izola Price (family medicine - [place]) Does not have cardiologist Had ekg several years ago.

## 2013-09-03 NOTE — Progress Notes (Signed)
Anesthesia Chart Review:  Patient is a 43 year old female scheduled for T&A and septoplasty on 09/08/13 by Dr. Simeon Craft.  History includes smoking, HTN, GERD, depression, anxiety, asthma, arthritis, anemia (iron deficiency anemia secondary to menorrhagia) and thrombocytosis (possible due to severe iron deficiency (on iron therapy IV), dizziness, s/p IUD, headaches, cholecystectomy.  BMI is 53.8 consistent with morbid obesity. PCP is Wendie Simmer, FNP and Dr. Yevette Edwards ([facility]). Hematologist is Dr. Marko Plume.  EKG on 09/02/13 showed NSR.  CXR on 09/02/13 showed: Decreased peribronchial thickening/bronchitic changes since previous exam. No acute infiltrate.  Preoperative labs noted. H/H 9.4/30.0.  PLT 680K. Serum pregnancy test negative.  BMET WNL.   Labs appear within there baseline. May need T&S, but will defer final decision to Dr. Simeon Craft and/or her anesthesiologist.  Myra Gianotti, PA-C [facility] Short Stay Center/Anesthesiology Phone [phone] 09/03/2013 7:39 PM

## 2013-09-07 NOTE — Progress Notes (Signed)
Patient notified to arrive at 1100 tomorrow morning.

## 2013-09-07 NOTE — H&P (Addendum)
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  She now presents for adenotonsillectomy and septoplasty.  Dr. Simeon Craft, Alroy Dust has discussed the risks (bleeding, infection, septal perforation, risks of general anesthesia), benefits, and alternatives of this procedure. The patient understands the risks and would like to proceed with the procedure. The chances of success of the procedure are >50% and the patient understands this. I personally performed an examination of the patient within 24 hours of the procedure.  PAST MEDICAL HISTORY: Past Medical History  Diagnosis Date  . High blood pressure   . GERD (gastroesophageal reflux disease)   . Depression   . Neuromuscular disorder   . Anxiety   . Asthma     seasonal related/ albuterol used when uri  . Headache(784.0)   . H/O dizziness   . Arthritis     knee, hands  . Anemia     iv iron treatment dec 2014/ see oncology for this last in dec 2014    PAST SURGICAL HISTORY: Past Surgical History  Procedure Laterality Date  . Gallbladder surgery  04/04/2010  . Tubal ligation    . Cholecystectomy  10/11    lap choli  . Carpal tunnel release  08/30/2011    Procedure: CARPAL TUNNEL RELEASE;  Surgeon: Wynonia Sours, MD;  Location: [facility];  Service: Orthopedics;  Laterality: Right;  . Intrauterine device insertion      07/19/2013    MEDICATIONS: No current facility-administered medications on file prior to encounter.   Current Outpatient Prescriptions on File Prior to Encounter  Medication Sig Dispense Refill  . albuterol (PROVENTIL HFA;VENTOLIN HFA) 108 (90 BASE) MCG/ACT inhaler Inhale 2 puffs into the lungs every 6 (six) hours as needed. For shortness of breath      . ARIPiprazole  (ABILIFY) 2 MG tablet Take 4 mg by mouth daily.       . beclomethasone (QVAR) 40 MCG/ACT inhaler Inhale 2 puffs into the lungs 2 (two) times daily.      Marland Kitchen ibuprofen (ADVIL,MOTRIN) 800 MG tablet Take 800 mg by mouth every 6 (six) hours as needed for moderate pain.       Marland Kitchen lisinopril (PRINIVIL,ZESTRIL) 20 MG tablet Take 20 mg by mouth daily.      Marland Kitchen loratadine-pseudoephedrine (CLARITIN-D 24-HOUR) 10-240 MG per 24 hr tablet Take 1 tablet by mouth daily.      Marland Kitchen zolpidem (AMBIEN) 10 MG tablet Take 10 mg by mouth at bedtime as needed for sleep.        ALLERGIES: No Known Allergies   SOCIAL HISTORY: History   Social History  . Marital Status: Married    Spouse Name: N/A    Number of Children: N/A  . Years of Education: N/A   Occupational History  . Not on file.   Social History Main Topics  . Smoking status: Current Some Day Smoker -- 0.50 packs/day    Types: Cigarettes  . Smokeless tobacco: Never Used  . Alcohol Use: 0.0 oz/week     Comment: rare  . Drug Use: No  . Sexual Activity: Not on file   Other Topics Concern  . Not on file   Social History Narrative  . No narrative on file    FAMILY HISTORY: Family History  Problem Relation Age of Onset  . Heart disease    .  Diabetes    . Hypertension    . Alcohol abuse    . Diabetes Mother   . Hypertension Father     REVIEW OF SYSTEMS:  HEENT: intermittent sore throat, nasal congestion, otherwise negative x 12 systems except per HPI   PHYSICAL EXAM:  GENERAL:  NAD VITAL SIGNS:   Filed Vitals:   09/08/13 1107  BP: 111/44  Pulse: 89  Temp: 98.1 F (36.7 C)  Resp: 18   SKIN:  Warm, dry HEENT:  2-3+ tonsils, septal deviation NECK: supple   LUNGS:  Grossly clear CARDIOVASCULAR:  RRR ABDOMEN:  soft MUSCULOSKELETAL: normal stength PSYCH:  Normal affect NEUROLOGIC:  CN 2-12 intact and symmetric  DIAGNOSTIC STUDIES: mild/moderate anemia on CBC (H/H 9.4/27)  ASSESSMENT AND PLAN: Plan to proceed with  adenotonsillectomy and endoscopic septoplasty. Patient understands the risks, benefits, and alternatives.  Cynthia Becker 09/08/2013 12:56 PM

## 2013-09-08 ENCOUNTER — Encounter (HOSPITAL_COMMUNITY): Payer: Self-pay | Admitting: Vascular Surgery

## 2013-09-08 ENCOUNTER — Ambulatory Visit (HOSPITAL_COMMUNITY)
Admission: RE | Admit: 2013-09-08 | Discharge: 2013-09-08 | Disposition: A | Discharge status: Home / Self Care | Payer: No Typology Code available for payment source | Source: Ambulatory Visit | Attending: Otolaryngology | Admitting: Otolaryngology

## 2013-09-08 ENCOUNTER — Encounter (HOSPITAL_COMMUNITY)
Admission: RE | Disposition: A | Discharge status: Home / Self Care | Payer: Self-pay | Source: Ambulatory Visit | Attending: Otolaryngology

## 2013-09-08 ENCOUNTER — Ambulatory Visit (HOSPITAL_COMMUNITY): Payer: No Typology Code available for payment source | Admitting: Anesthesiology

## 2013-09-08 ENCOUNTER — Encounter (HOSPITAL_COMMUNITY): Payer: Self-pay | Admitting: Certified Registered Nurse Anesthetist

## 2013-09-08 DIAGNOSIS — F17200 Nicotine dependence, unspecified, uncomplicated: Secondary | ICD-10-CM | POA: Insufficient documentation

## 2013-09-08 DIAGNOSIS — J342 Deviated nasal septum: Secondary | ICD-10-CM | POA: Insufficient documentation

## 2013-09-08 DIAGNOSIS — F329 Major depressive disorder, single episode, unspecified: Secondary | ICD-10-CM | POA: Insufficient documentation

## 2013-09-08 DIAGNOSIS — K219 Gastro-esophageal reflux disease without esophagitis: Secondary | ICD-10-CM | POA: Insufficient documentation

## 2013-09-08 DIAGNOSIS — J45909 Unspecified asthma, uncomplicated: Secondary | ICD-10-CM | POA: Insufficient documentation

## 2013-09-08 DIAGNOSIS — F3289 Other specified depressive episodes: Secondary | ICD-10-CM | POA: Insufficient documentation

## 2013-09-08 DIAGNOSIS — F411 Generalized anxiety disorder: Secondary | ICD-10-CM | POA: Insufficient documentation

## 2013-09-08 DIAGNOSIS — J3501 Chronic tonsillitis: Secondary | ICD-10-CM | POA: Insufficient documentation

## 2013-09-08 HISTORY — PX: TONSILLECTOMY AND ADENOIDECTOMY: SHX28

## 2013-09-08 HISTORY — PX: SEPTOPLASTY: SHX2393

## 2013-09-08 LAB — TYPE AND SCREEN
ABO/RH(D): B POS
Antibody Screen: NEGATIVE

## 2013-09-08 LAB — ABO/RH: ABO/RH(D): B POS

## 2013-09-08 SURGERY — TONSILLECTOMY AND ADENOIDECTOMY
Anesthesia: General | Site: Nose

## 2013-09-08 MED ORDER — PROPOFOL 10 MG/ML IV BOLUS
INTRAVENOUS | Status: DC | PRN
  Administered 2013-09-08: 200 mg via INTRAVENOUS

## 2013-09-08 MED ORDER — OXYCODONE HCL 5 MG/5ML PO SOLN
ORAL | Status: AC
  Filled 2013-09-08: qty 5

## 2013-09-08 MED ORDER — MIDAZOLAM HCL 5 MG/5ML IJ SOLN
INTRAMUSCULAR | Status: DC | PRN
  Administered 2013-09-08: 2 mg via INTRAVENOUS

## 2013-09-08 MED ORDER — GLYCOPYRROLATE 0.2 MG/ML IJ SOLN
INTRAMUSCULAR | Status: DC | PRN
Start: 2013-09-08 — End: 2013-09-08
  Administered 2013-09-08: 0.2 mg via INTRAVENOUS
  Administered 2013-09-08: 0.6 mg via INTRAVENOUS

## 2013-09-08 MED ORDER — MIDAZOLAM HCL 2 MG/2ML IJ SOLN: 1.0000 mg | INTRAMUSCULAR | Status: DC | PRN

## 2013-09-08 MED ORDER — OXYCODONE HCL 5 MG PO TABS: 5.0000 mg | ORAL_TABLET | Freq: Once | ORAL | Status: AC | PRN

## 2013-09-08 MED ORDER — OXYMETAZOLINE HCL 0.05 % NA SOLN
NASAL | Status: DC | PRN
  Administered 2013-09-08: 1

## 2013-09-08 MED ORDER — METOPROLOL TARTRATE 1 MG/ML IV SOLN
INTRAVENOUS | Status: DC | PRN
  Administered 2013-09-08 (×2): 2 mg via INTRAVENOUS
  Administered 2013-09-08: 1 mg via INTRAVENOUS

## 2013-09-08 MED ORDER — LIDOCAINE-EPINEPHRINE 1 %-1:100000 IJ SOLN
INTRAMUSCULAR | Status: DC | PRN
  Administered 2013-09-08: 10 mL

## 2013-09-08 MED ORDER — LIDOCAINE-EPINEPHRINE 1 %-1:100000 IJ SOLN
INTRAMUSCULAR | Status: AC
  Filled 2013-09-08: qty 1

## 2013-09-08 MED ORDER — HYDROMORPHONE HCL PF 1 MG/ML IJ SOLN
0.2500 mg | INTRAMUSCULAR | Status: DC | PRN
  Administered 2013-09-08: 0.5 mg via INTRAVENOUS
  Administered 2013-09-08 (×2): 0.25 mg via INTRAVENOUS

## 2013-09-08 MED ORDER — GLYCOPYRROLATE 0.2 MG/ML IJ SOLN
INTRAMUSCULAR | Status: AC
  Filled 2013-09-08: qty 1

## 2013-09-08 MED ORDER — MUPIROCIN 2 % EX OINT
TOPICAL_OINTMENT | CUTANEOUS | Status: DC | PRN
  Administered 2013-09-08: 1 via NASAL

## 2013-09-08 MED ORDER — OXYCODONE HCL 5 MG/5ML PO SOLN
5.0000 mg | Freq: Once | ORAL | Status: AC | PRN
  Administered 2013-09-08: 5 mg via ORAL

## 2013-09-08 MED ORDER — FENTANYL CITRATE 0.05 MG/ML IJ SOLN
INTRAMUSCULAR | Status: AC
  Filled 2013-09-08: qty 5

## 2013-09-08 MED ORDER — OXYMETAZOLINE HCL 0.05 % NA SOLN
NASAL | Status: AC
  Filled 2013-09-08: qty 15

## 2013-09-08 MED ORDER — DEXAMETHASONE SODIUM PHOSPHATE 10 MG/ML IJ SOLN
10.0000 mg | Freq: Once | INTRAMUSCULAR | Status: AC
  Administered 2013-09-08: 10 mg via INTRAVENOUS

## 2013-09-08 MED ORDER — DEXAMETHASONE SODIUM PHOSPHATE 4 MG/ML IJ SOLN
INTRAMUSCULAR | Status: AC
  Filled 2013-09-08: qty 1

## 2013-09-08 MED ORDER — FENTANYL CITRATE 0.05 MG/ML IJ SOLN: 50.0000 ug | Freq: Once | INTRAMUSCULAR | Status: DC

## 2013-09-08 MED ORDER — HYDRALAZINE HCL 20 MG/ML IJ SOLN
INTRAMUSCULAR | Status: DC | PRN
  Administered 2013-09-08 (×2): 10 mg via INTRAVENOUS

## 2013-09-08 MED ORDER — PROMETHAZINE HCL 25 MG/ML IJ SOLN: 6.2500 mg | INTRAMUSCULAR | Status: DC | PRN

## 2013-09-08 MED ORDER — FENTANYL CITRATE 0.05 MG/ML IJ SOLN
INTRAMUSCULAR | Status: DC | PRN
  Administered 2013-09-08: 50 ug via INTRAVENOUS
  Administered 2013-09-08: 100 ug via INTRAVENOUS
  Administered 2013-09-08: 50 ug via INTRAVENOUS
  Administered 2013-09-08: 100 ug via INTRAVENOUS

## 2013-09-08 MED ORDER — MUPIROCIN 2 % EX OINT
TOPICAL_OINTMENT | CUTANEOUS | Status: AC
  Filled 2013-09-08: qty 22

## 2013-09-08 MED ORDER — MIDAZOLAM HCL 2 MG/2ML IJ SOLN
INTRAMUSCULAR | Status: AC
  Filled 2013-09-08: qty 2

## 2013-09-08 MED ORDER — CLINDAMYCIN PHOSPHATE 600 MG/50ML IV SOLN
600.0000 mg | Freq: Once | INTRAVENOUS | Status: AC
  Administered 2013-09-08: 600 mg via INTRAVENOUS
  Filled 2013-09-08: qty 50

## 2013-09-08 MED ORDER — ARTIFICIAL TEARS OP OINT
TOPICAL_OINTMENT | OPHTHALMIC | Status: DC | PRN
  Administered 2013-09-08: 1 via OPHTHALMIC

## 2013-09-08 MED ORDER — ONDANSETRON HCL 4 MG/2ML IJ SOLN
INTRAMUSCULAR | Status: DC | PRN
  Administered 2013-09-08: 4 mg via INTRAVENOUS

## 2013-09-08 MED ORDER — HYDRALAZINE HCL 20 MG/ML IJ SOLN
INTRAMUSCULAR | Status: AC
  Filled 2013-09-08: qty 1

## 2013-09-08 MED ORDER — 0.9 % SODIUM CHLORIDE (POUR BTL) OPTIME
TOPICAL | Status: DC | PRN
  Administered 2013-09-08: 1000 mL

## 2013-09-08 MED ORDER — HYDROMORPHONE HCL PF 1 MG/ML IJ SOLN
INTRAMUSCULAR | Status: AC
  Filled 2013-09-08: qty 1

## 2013-09-08 MED ORDER — PROPOFOL 10 MG/ML IV BOLUS
INTRAVENOUS | Status: AC
  Filled 2013-09-08: qty 20

## 2013-09-08 MED ORDER — METOPROLOL TARTRATE 1 MG/ML IV SOLN
INTRAVENOUS | Status: AC
  Filled 2013-09-08: qty 5

## 2013-09-08 MED ORDER — ROCURONIUM BROMIDE 100 MG/10ML IV SOLN
INTRAVENOUS | Status: DC | PRN
  Administered 2013-09-08: 50 mg via INTRAVENOUS

## 2013-09-08 MED ORDER — LACTATED RINGERS IV SOLN
INTRAVENOUS | Status: DC | PRN
  Administered 2013-09-08 (×2): via INTRAVENOUS

## 2013-09-08 MED ORDER — ROCURONIUM BROMIDE 50 MG/5ML IV SOLN
INTRAVENOUS | Status: AC
  Filled 2013-09-08: qty 1

## 2013-09-08 MED ORDER — LACTATED RINGERS IV SOLN
INTRAVENOUS | Status: DC
  Administered 2013-09-08: 12:00:00 via INTRAVENOUS

## 2013-09-08 MED ORDER — DEXAMETHASONE SODIUM PHOSPHATE 4 MG/ML IJ SOLN: INTRAMUSCULAR | Status: DC | PRN

## 2013-09-08 MED ORDER — NEOSTIGMINE METHYLSULFATE 1 MG/ML IJ SOLN
INTRAMUSCULAR | Status: DC | PRN
  Administered 2013-09-08: 4 mg via INTRAVENOUS

## 2013-09-08 MED ORDER — LIDOCAINE HCL (CARDIAC) 20 MG/ML IV SOLN
INTRAVENOUS | Status: DC | PRN
  Administered 2013-09-08: 100 mg via INTRAVENOUS

## 2013-09-08 SURGICAL SUPPLY — 51 items
ATTRACTOMAT 16X20 MAGNETIC DRP (DRAPES) IMPLANT
BLADE INF TURB ROT M4 2 5PK (BLADE) IMPLANT
CANISTER SUCTION 2500CC (MISCELLANEOUS) ×3 IMPLANT
CATH ROBINSON RED A/P 10FR (CATHETERS) ×3 IMPLANT
CLEANER TIP ELECTROSURG 2X2 (MISCELLANEOUS) ×3 IMPLANT
COAGULATOR SUCT SWTCH 10FR 6 (ELECTROSURGICAL) ×4 IMPLANT
CONT SPEC 4OZ CLIKSEAL STRL BL (MISCELLANEOUS) ×3 IMPLANT
CRADLE DONUT ADULT HEAD (MISCELLANEOUS) ×1 IMPLANT
DECANTER SPIKE VIAL GLASS SM (MISCELLANEOUS) IMPLANT
DRESSING TELFA 8X3 (GAUZE/BANDAGES/DRESSINGS) IMPLANT
DRSG NASOPORE 8CM (GAUZE/BANDAGES/DRESSINGS) IMPLANT
ELECT COATED BLADE 2.86 ST (ELECTRODE) ×3 IMPLANT
ELECT REM PT RETURN 9FT ADLT (ELECTROSURGICAL) ×3
ELECTRODE REM PT RTRN 9FT ADLT (ELECTROSURGICAL) IMPLANT
FILTER ARTHROSCOPY CONVERTOR (FILTER) IMPLANT
GAUZE SPONGE 4X4 16PLY XRAY LF (GAUZE/BANDAGES/DRESSINGS) ×2 IMPLANT
GLOVE BIOGEL PI IND STRL 7.0 (GLOVE) IMPLANT
GLOVE BIOGEL PI INDICATOR 7.0 (GLOVE) ×1
GLOVE SURG SS PI 7.0 STRL IVOR (GLOVE) ×1 IMPLANT
GLOVE SURG SS PI 7.5 STRL IVOR (GLOVE) ×3 IMPLANT
GOWN STRL REUS W/ TWL LRG LVL3 (GOWN DISPOSABLE) ×4 IMPLANT
GOWN STRL REUS W/TWL LRG LVL3 (GOWN DISPOSABLE) ×6
HOVERMATT SINGLE USE (MISCELLANEOUS) ×1 IMPLANT
KIT BASIN OR (CUSTOM PROCEDURE TRAY) ×3 IMPLANT
KIT ROOM TURNOVER OR (KITS) ×3 IMPLANT
NS IRRIG 1000ML POUR BTL (IV SOLUTION) ×3 IMPLANT
PACK SURGICAL SETUP 50X90 (CUSTOM PROCEDURE TRAY) ×2 IMPLANT
PAD ARMBOARD 7.5X6 YLW CONV (MISCELLANEOUS) ×6 IMPLANT
PATTIES SURGICAL .5 X3 (DISPOSABLE) ×3 IMPLANT
PENCIL BUTTON HOLSTER BLD 10FT (ELECTRODE) ×3 IMPLANT
PENCIL FOOT CONTROL (ELECTRODE) IMPLANT
SOLUTION ANTI FOG 6CC (MISCELLANEOUS) ×3 IMPLANT
SOLUTION BUTLER CLEAR DIP (MISCELLANEOUS) ×1 IMPLANT
SPECIMEN JAR SMALL (MISCELLANEOUS) IMPLANT
SPLINT NASAL DOYLE BI-VL (GAUZE/BANDAGES/DRESSINGS) ×1 IMPLANT
SPONGE TONSIL 1 RF SGL (DISPOSABLE) ×2 IMPLANT
SPONGE TONSIL 1.25 RF SGL STRG (GAUZE/BANDAGES/DRESSINGS) ×1 IMPLANT
SUT CHROMIC 4 0 G 3 (SUTURE) IMPLANT
SUT CHROMIC GUT 2 0 PS 2 27 (SUTURE) IMPLANT
SUT ETHIBOND 2 0 SH (SUTURE) IMPLANT
SUT ETHILON 4 0 P 3 18 (SUTURE) ×2 IMPLANT
SUT PLAIN 4 0 ~~LOC~~ 1 (SUTURE) ×3 IMPLANT
SYR BULB 3OZ (MISCELLANEOUS) ×3 IMPLANT
TOWEL OR 17X24 6PK STRL BLUE (TOWEL DISPOSABLE) ×2 IMPLANT
TOWEL OR 17X26 10 PK STRL BLUE (TOWEL DISPOSABLE) ×3 IMPLANT
TRAY ENT MC OR (CUSTOM PROCEDURE TRAY) ×3 IMPLANT
TUBE CONNECTING 12X1/4 (SUCTIONS) ×3 IMPLANT
TUBE SALEM SUMP 12R W/ARV (TUBING) ×2 IMPLANT
TUBE SALEM SUMP 16 FR W/ARV (TUBING) ×1 IMPLANT
WATER STERILE IRR 1000ML POUR (IV SOLUTION) ×2 IMPLANT
YANKAUER SUCT BULB TIP NO VENT (SUCTIONS) ×3 IMPLANT

## 2013-09-08 NOTE — Discharge Instructions (Signed)
Clear or full liquid diet x 2 days, then advance to post-tonsillectomy diet as tolerated. No nose blowing, heavy lifting, or strenuous activity x 1 week. Follow up with Dr. Simeon Craft in 1 week. Rx given to family for hydrocodone/acetaminophen and sent to pharmacy for antibiotics, prednisolone, and zofran.

## 2013-09-08 NOTE — Transfer of Care (Signed)
Immediate Anesthesia Transfer of Care Note  Patient: Cynthia Becker  Procedure(s) Performed: Procedure(s): TONSILLECTOMY  (Bilateral) SEPTOPLASTY (N/A)  Patient Location: PACU  Anesthesia Type:General  Level of Consciousness: awake, alert , oriented and patient cooperative  Airway & Oxygen Therapy: Patient Spontanous Breathing and Patient connected to face mask oxygen  Post-op Assessment: Report given to PACU RN, Post -op Vital signs reviewed and stable and Patient moving all extremities X 4  Post vital signs: Reviewed and stable  Complications: No apparent anesthesia complications

## 2013-09-08 NOTE — Discharge Summary (Signed)
09/08/2013  8:35 PM  Date of Admission:09/08/2013 Date of Discharge:09/08/2013  Discharge FH:LKTG, Alroy Dust, MD  Admitting YB:WLSL, Alroy Dust, MD  Reason for admission/final discharge diagnosis: septal deviation, chronic tonsillitis/tonsillar hypertrophy  Procedure(s) performed: tonsillectomy and septoplasty  Discharge Condition:stable/good  Discharge Exam: nose and mouth hemostatic with tonsils surgically absent and Doyle splints in nose  Discharge Instructions: No heavy lifting or nose blowing x 2 weeks, follow up with  Dr. At [facility] in one week. Post-tonsillectomy diet. Rx given to family/sent to pharmacy  Hospital Course: underwent tonsillectomy and septoplasty, NOT ADMITTED, discharged from PACU in good condition  Ruby Cola 8:35 PM 09/08/2013

## 2013-09-08 NOTE — Preoperative (Signed)
Beta Blockers   Reason not to administer Beta Blockers:Not Applicable 

## 2013-09-08 NOTE — Op Note (Signed)
DATE OF OPERATION: 09/08/2013 Surgeon: Ruby Cola Procedure Performed: endoscopic septoplasty [phone]  tonsillectomy >44 years old [phone]  PREOPERATIVE DIAGNOSIS: septal deviation, tonsillar hypertrophy with chronic tonsillitis POSTOPERATIVE DIAGNOSIS: septal deviation, tonsillar hypertrophy with chronic tonsillitis  SURGEON: Ruby Cola ANESTHESIA: General endotracheal.  ESTIMATED BLOOD LOSS: less than 150mL  DRAINS: Doyle splints SPECIMENS: nasal contents and right and left tonsil INDICATIONS: The patient is a 43yo with a history of septal deviation, tonsillar hypertrophy with chronic tonsillitis   DESCRIPTION OF OPERATION: The patient was brought to the operating room and was placed in the supine position and was placed under general endotracheal anesthesia by anesthesiology. The bed was turned 90 degrees and the Crowe-Davis mouth retractor was placed over the endotracheal tube and suspended from the Mayo stand. The palate was inspected and palpated and noted to be intact with no submucous cleft. The uvula was midline and normal. The adenoids were inspected with a dental mirror and noted to be normal so adenoidectomy was not performed. Next the right tonsil was grasped with a curved Allis clamp and dissected from the right tonsillar fossa using the Bovie. Meticulous hemostasis was then achieved. The left tonsil was then grasped with the curved Allis and dissected from the left tonsillar fossa using the Bovie. Meticulous hemostasis was achieved.   The patient's nose was inspected and submucosal injections of lidocaine 1% with epinephrine 1:100,000 were placed in the septum in the standard fashion. The nose was decongested with Afrin-coated pledgets which were then removed. The patient was prepped and draped in the usual sterile fashion. After the Afrin pledgets were removed, the inferior turbinates were outfractured.  I first began with the septoplasty. Using the zero degree endoscope a  Killian incision was made on the right side of the septum using a #15 blade. The Cottle elevator was then used to elevate a submucoperichondrial and submucoperiostial  flap. The cartilaginous septum incised anteriorly using the cartilage knife leaving a generous 1cm anterior and superior strut. A submucoperichondrial and submucoperiosteal flap was elevated on the opposite side as well. The deviated portions of the cartilage and bone were resected as needed using the Jansen-Middleton, Thru-Cuts, and Blakesley forceps. The mucosal flaps were then reapproximated and sutured using a through and through 4-0 plain gut whip stitch. The patient's nasal airway was inspected and was found to be widely patent. The incision site was then closed with a plain gut suture.  Once hemostasis was noted the septum was bolstered using Doyle splints which were secured using a transcollumellar 3-0 Nylon suture with the knot in the left nostril. The nose and stomach were suctioned out and the patient was turned back to anesthesia and awakened from anesthesia and extubated without difficulty. The patient tolerated the procedure well with no immediate complications and was taken to the postoperative recovery area in good condition.   Dr. Ruby Cola was present and performed the entire procedure. 09/08/2013  2:40 PM Ruby Cola

## 2013-09-08 NOTE — Anesthesia Preprocedure Evaluation (Signed)
Anesthesia Evaluation    Airway Mallampati: II TM Distance: <3 FB Neck ROM: Full    Dental   Pulmonary asthma , COPDCurrent Smoker,  + rhonchi         Cardiovascular hypertension, Rhythm:Regular Rate:Normal     Neuro/Psych  Headaches, Anxiety Depression  Neuromuscular disease    GI/Hepatic GERD-  ,  Endo/Other  Morbid obesity  Renal/GU      Musculoskeletal   Abdominal (+) + obese,   Peds  Hematology   Anesthesia Other Findings   Reproductive/Obstetrics                           Anesthesia Physical Anesthesia Plan  ASA: III  Anesthesia Plan: General   Post-op Pain Management:    Induction: Intravenous  Airway Management Planned: Oral ETT and Video Laryngoscope Planned  Additional Equipment:   Intra-op Plan:   Post-operative Plan: Extubation in OR  Informed Consent: I have reviewed the patients History and Physical, chart, labs and discussed the procedure including the risks, benefits and alternatives for the proposed anesthesia with the patient or authorized representative who has indicated his/her understanding and acceptance.     Plan Discussed with: CRNA and Surgeon  Anesthesia Plan Comments:         Anesthesia Quick Evaluation

## 2013-09-08 NOTE — Anesthesia Postprocedure Evaluation (Signed)
  Anesthesia Post-op Note  Patient: Cynthia Becker  Procedure(s) Performed: Procedure(s): TONSILLECTOMY  (Bilateral) SEPTOPLASTY (N/A)  Patient Location: PACU  Anesthesia Type:General  Level of Consciousness: awake and alert   Airway and Oxygen Therapy: Patient Spontanous Breathing  Post-op Pain: mild  Post-op Assessment: Post-op Vital signs reviewed, Patient's Cardiovascular Status Stable, Respiratory Function Stable, Patent Airway, No signs of Nausea or vomiting and Pain level controlled  Post-op Vital Signs: Reviewed and stable  Complications: No apparent anesthesia complications

## 2013-09-10 ENCOUNTER — Encounter (HOSPITAL_COMMUNITY): Payer: Self-pay | Admitting: Otolaryngology

## 2013-10-14 ENCOUNTER — Other Ambulatory Visit (HOSPITAL_COMMUNITY): Payer: Self-pay | Admitting: Internal Medicine

## 2013-10-14 ENCOUNTER — Ambulatory Visit (HOSPITAL_COMMUNITY)
Admission: RE | Admit: 2013-10-14 | Discharge: 2013-10-14 | Disposition: A | Discharge status: Home / Self Care | Payer: No Typology Code available for payment source | Source: Ambulatory Visit | Attending: Internal Medicine | Admitting: Internal Medicine

## 2013-10-14 DIAGNOSIS — R05 Cough: Secondary | ICD-10-CM | POA: Insufficient documentation

## 2013-10-14 DIAGNOSIS — R059 Cough, unspecified: Secondary | ICD-10-CM

## 2013-11-21 ENCOUNTER — Other Ambulatory Visit: Payer: Self-pay | Admitting: Oncology

## 2013-11-24 ENCOUNTER — Encounter: Payer: Self-pay | Admitting: *Deleted

## 2013-11-24 ENCOUNTER — Ambulatory Visit (HOSPITAL_BASED_OUTPATIENT_CLINIC_OR_DEPARTMENT_OTHER): Payer: No Typology Code available for payment source | Admitting: Oncology

## 2013-11-24 ENCOUNTER — Other Ambulatory Visit (HOSPITAL_BASED_OUTPATIENT_CLINIC_OR_DEPARTMENT_OTHER): Payer: No Typology Code available for payment source

## 2013-11-24 ENCOUNTER — Telehealth: Payer: Self-pay | Admitting: *Deleted

## 2013-11-24 ENCOUNTER — Encounter: Payer: Self-pay | Admitting: Oncology

## 2013-11-24 ENCOUNTER — Telehealth: Payer: Self-pay | Admitting: Oncology

## 2013-11-24 VITALS — BP 165/95 | HR 93 | Temp 98.4°F | Resp 20 | Ht 65.0 in | Wt 323.9 lb

## 2013-11-24 DIAGNOSIS — E669 Obesity, unspecified: Secondary | ICD-10-CM

## 2013-11-24 DIAGNOSIS — D509 Iron deficiency anemia, unspecified: Secondary | ICD-10-CM

## 2013-11-24 DIAGNOSIS — D500 Iron deficiency anemia secondary to blood loss (chronic): Secondary | ICD-10-CM

## 2013-11-24 DIAGNOSIS — N920 Excessive and frequent menstruation with regular cycle: Secondary | ICD-10-CM

## 2013-11-24 DIAGNOSIS — F17200 Nicotine dependence, unspecified, uncomplicated: Secondary | ICD-10-CM

## 2013-11-24 DIAGNOSIS — D473 Essential (hemorrhagic) thrombocythemia: Secondary | ICD-10-CM

## 2013-11-24 DIAGNOSIS — K5900 Constipation, unspecified: Secondary | ICD-10-CM

## 2013-11-24 LAB — CBC WITH DIFFERENTIAL/PLATELET
BASO%: 1.1 % (ref 0.0–2.0)
Basophils Absolute: 0.1 10*3/uL (ref 0.0–0.1)
EOS%: 8 % — AB (ref 0.0–7.0)
Eosinophils Absolute: 0.5 10*3/uL (ref 0.0–0.5)
HCT: 29.4 % — ABNORMAL LOW (ref 34.8–46.6)
HGB: 8.5 g/dL — ABNORMAL LOW (ref 11.6–15.9)
LYMPH#: 2.3 10*3/uL (ref 0.9–3.3)
LYMPH%: 36.5 % (ref 14.0–49.7)
MCH: 14.6 pg — AB (ref 25.1–34.0)
MCHC: 28.9 g/dL — ABNORMAL LOW (ref 31.5–36.0)
MCV: 50.5 fL — AB (ref 79.5–101.0)
MONO#: 0.5 10*3/uL (ref 0.1–0.9)
MONO%: 7.8 % (ref 0.0–14.0)
NEUT#: 3 10*3/uL (ref 1.5–6.5)
NEUT%: 46.6 % (ref 38.4–76.8)
Platelets: 694 10*3/uL — ABNORMAL HIGH (ref 145–400)
RBC: 5.82 10*6/uL — ABNORMAL HIGH (ref 3.70–5.45)
RDW: 22.3 % — AB (ref 11.2–14.5)
WBC: 6.4 10*3/uL (ref 3.9–10.3)
nRBC: 0 % (ref 0–0)

## 2013-11-24 LAB — IRON AND TIBC CHCC
%SAT: 3 % — ABNORMAL LOW (ref 21–57)
IRON: 14 ug/dL — AB (ref 41–142)
TIBC: 419 ug/dL (ref 236–444)
UIBC: 405 ug/dL — ABNORMAL HIGH (ref 120–384)

## 2013-11-24 NOTE — Telephone Encounter (Signed)
Per staff message and POF I have scheduled appts.  JMW  

## 2013-11-24 NOTE — Telephone Encounter (Signed)
Message copied by Patton Salles on Wed Nov 24, 2013  3:16 PM ------      Message from: Evlyn Clines P      Created: Wed Nov 24, 2013  2:44 PM       Labs seen and need follow up: please let her know iron is very low again, so she should go ahead with the IV iron this Friday as we set up ------

## 2013-11-24 NOTE — Progress Notes (Signed)
OFFICE PROGRESS NOTE   11/24/2013   Physicians:Roberson, Carmell Austria, FNP and Yevette Edwards MD ([facility]); Constant, Peggy   INTERVAL HISTORY:  Patient is seen, alone for visit, in follow up of iron deficiency anemia related to heavy menstrual bleeding, for which she continues oral iron and had IV feraheme 1020 mg on 04-30-2013. She has had endometrial biopsy 04-28-2013 with no malignancy or hyperplasia, Mirena IUD placed 07-19-2013, and Megace added by Dr Elly Modena in 08-2013 due to continued bleeding. She is having ongoing daily vaginal spotting, requiring ~ 3 light pads daily, and still using ~ a box of 18 tampons with each period. She has not let Dr Elly Modena know yet that the megace + IUD has not resolved the problem, so we will request a follow up there. She had tonsillectomy and nasal septum repair by Dr Ruby Cola on 09-08-13, benign pathology. She is not aware of significant bleeding from the ENT procedure, tho certainly there would have been associated blood loss with this.  Patient has felt much more fatigued again for at least 3 weeks, similar to what she experienced last fall, when Hgb was down to 8.8. She denies SOB at rest and no chest pain.   She had stopped smoking entirely, then baby granddaughter died recently and she resumed this. We have discussed now and she wants to stop again, as well as to improve diet, exercise etc.   HISTORY Patient was seen at [facility] on 01-04-2013 for problems including fatigue and heavy menses. CBC had WBC 6.2, Hgb 8.1, MCV 51.3, plt 752, diff normal except 7% eos, iron 11, %sat 3, and hemoglobin electrophoresis with Hgb A 69, F 0, A2 2.1 and C 28.7. Sickle solubility test negative. 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 She was begun on ferrous sulfate po in August, which she has been taking with meals once daily and is not tolerating well due to constipation and nausea. Repeat CBC on 03-05-13  had WBC 6.8, Hgb 9.2, MCV 56 and plt 731k., automated diff ok except eos 7%.  Patient recalls having been anemic x years, last on oral iron 2 years prior to referral to this office. For past ~ 7-8 months her menstrual periods remain regular but have been much heavier than usual for at least 5 days, and additional bleeding for ~ 3 days. She used at least 18 super tampons during first 2 days of each period. She is not aware of other bleeding. She was noticeably fatigued with even nonstrenuous activity at time of referral to this office..  She has never been transfused. She received IV feraheme 1020 mg on 04-30-13. She had endometrial biopsy with benign findings on 04-28-13, and IUD placed 07-19-13, then began Megace by gynecology on 08-26-13 due to ongoing bleeding.    Review of systems as above, also: No fever. Feels fullness low in throat since tonsillectomy, swallowing without difficulty. Bowels moving much better with prn lactulose from PCP. Marland Kitchen No LE swelling Remainder of 10 point Review of Systems negative/ unchanged  Objective:  Vital signs in last 24 hours:  BP 165/95  Pulse 93  Temp(Src) 98.4 F (36.9 C) (Oral)  Resp 20  Ht 5\' 5"  (1.651 m)  Wt 323 lb 14.4 oz (146.92 kg)  BMI 53.90 kg/m2 weight is up 6 lbs  Alert, oriented and appropriate, very pleasant as always. Ambulatory, looks tired but NAD.   HEENT:PERRL, sclerae not icteric. Oral mucosa moist without lesions, posterior pharynx clear.  Neck supple. No JVD. Mucous membranes and conjunctivae pale Lymphatics:no cervical,suraclavicular, axillary or inguinal adenopathy Resp: clear to auscultation bilaterally and normal percussion bilaterally Cardio: regular rate and rhythm. No gallop. GI: abdomen obese, soft, nontender, not distended, no mass or organomegaly. Normally active bowel sounds. Surgical incision not remarkable. Musculoskeletal/ Extremities: without pitting edema, cords, tenderness. PSYCH mood and affect appropriate Neuro:  no peripheral neuropathy. Otherwise nonfocal Skin without rash, ecchymosis, petechiae Breasts: without dominant mass, skin or nipple findings. Axillae benign.   Lab Results:  Results for orders placed in visit on 11/24/13  CBC WITH DIFFERENTIAL      Result Value Ref Range   WBC 6.4  3.9 - 10.3 10e3/uL   NEUT# 3.0  1.5 - 6.5 10e3/uL   HGB 8.5 (*) 11.6 - 15.9 g/dL   HCT 29.4 (*) 34.8 - 46.6 %   Platelets 694 (*) 145 - 400 10e3/uL   MCV 50.5 (*) 79.5 - 101.0 fL   MCH 14.6 (*) 25.1 - 34.0 pg   MCHC 28.9 (*) 31.5 - 36.0 g/dL   RBC 5.82 (*) 3.70 - 5.45 10e6/uL   RDW 22.3 (*) 11.2 - 14.5 %   lymph# 2.3  0.9 - 3.3 10e3/uL   MONO# 0.5  0.1 - 0.9 10e3/uL   Eosinophils Absolute 0.5  0.0 - 0.5 10e3/uL   Basophils Absolute 0.1  0.0 - 0.1 10e3/uL   NEUT% 46.6  38.4 - 76.8 %   LYMPH% 36.5  14.0 - 49.7 %   MONO% 7.8  0.0 - 14.0 %   EOS% 8.0 (*) 0.0 - 7.0 %   BASO% 1.1  0.0 - 2.0 %   nRBC 0  0 - 0 %  IRON AND TIBC [facility]      Result Value Ref Range   Iron 14 (*) 41 - 142 ug/dL   TIBC 419  236 - 444 ug/dL   UIBC 405 (*) 120 - 384 ug/dL   %SAT 3 (*) 21 - 57 %   iron studies available after visit will be communicated to her  Studies/Results:  No results found.  Medications: I have reviewed the patient's current medications. She agrees to repeat feraheme if iron studies confirm expected recurrent iron deficiency  DISCUSSION we have discussed increased appetite related to megace. She agrees with referral back to gyn with ongoing vaginal bleeding, tho is at least somewhat improved  Assessment/Plan: 1.severe iron deficiency with anemia: heavy gyn blood loss previously and ongoing bleeding despite IUD and megace. Will give Feraheme 1020 mg IV in next week or so, and continue po iron. Will ask Dr Elly Modena or associate to see her again. I believe that she would be open to considering hysterectomy if recommended. I will see her again in ~ 3 months or sooner if needed 2.thrombocytosis likely related  to severe iron deficiency. Follow with additional IV iron. 3.chronic constipation improved with lactulose, which seems to have helped nausea 4.obesity, BMI 53.9 5.ongoing tobacco: hopefully will DC this again, for good 6.hemoglobin C trait, but also clearly iron deficient.  Patient understands recommendations and is in agreement with plan.   Gordy Levan, MD   11/24/2013, 8:15 PM

## 2013-11-24 NOTE — Telephone Encounter (Signed)
, °

## 2013-11-24 NOTE — Telephone Encounter (Signed)
Pt notified of results below. Verbalized understanding.  

## 2013-11-25 ENCOUNTER — Encounter: Payer: Self-pay | Admitting: Oncology

## 2013-11-25 NOTE — Progress Notes (Signed)
Talked to uninsured pt about financial assistance.  Pt has the orange card but would like to apply for Medicaid again.  She was denied in the past.  Raquel will give pt Medicaid application at her visit tomorrow.

## 2013-11-26 ENCOUNTER — Ambulatory Visit (HOSPITAL_BASED_OUTPATIENT_CLINIC_OR_DEPARTMENT_OTHER): Payer: No Typology Code available for payment source

## 2013-11-26 VITALS — BP 127/83 | HR 81 | Temp 98.2°F | Resp 18

## 2013-11-26 DIAGNOSIS — D500 Iron deficiency anemia secondary to blood loss (chronic): Secondary | ICD-10-CM

## 2013-11-26 DIAGNOSIS — D509 Iron deficiency anemia, unspecified: Secondary | ICD-10-CM

## 2013-11-26 MED ORDER — SODIUM CHLORIDE 0.9 % IV SOLN
1020.0000 mg | Freq: Once | INTRAVENOUS | Status: AC
  Administered 2013-11-26: 1020 mg via INTRAVENOUS
  Filled 2013-11-26: qty 34

## 2013-11-26 NOTE — Patient Instructions (Signed)

## 2013-11-30 ENCOUNTER — Encounter: Payer: Self-pay | Admitting: Oncology

## 2013-11-30 NOTE — Progress Notes (Signed)
Called patient about faraheme patient assistance, the patient will not be back until September and do not know if she will need any assistance.

## 2014-01-03 ENCOUNTER — Other Ambulatory Visit: Payer: Self-pay | Admitting: Obstetrics and Gynecology

## 2014-01-06 ENCOUNTER — Other Ambulatory Visit: Payer: Self-pay | Admitting: Obstetrics and Gynecology

## 2014-01-27 ENCOUNTER — Ambulatory Visit (INDEPENDENT_AMBULATORY_CARE_PROVIDER_SITE_OTHER): Payer: No Typology Code available for payment source | Admitting: Obstetrics and Gynecology

## 2014-01-27 ENCOUNTER — Encounter: Payer: Self-pay | Admitting: Obstetrics and Gynecology

## 2014-01-27 VITALS — BP 128/88 | HR 80 | Temp 98.7°F | Ht 65.0 in | Wt 327.7 lb

## 2014-01-27 DIAGNOSIS — N949 Unspecified condition associated with female genital organs and menstrual cycle: Secondary | ICD-10-CM

## 2014-01-27 DIAGNOSIS — N938 Other specified abnormal uterine and vaginal bleeding: Secondary | ICD-10-CM | POA: Insufficient documentation

## 2014-01-27 NOTE — Progress Notes (Signed)
Patient ID: Cynthia Becker, female   DOB: 06-22-1971, 43 y.o.   MRN: [id number] 43 yo G3P3 with persistent DUB s/p Mirena IUD and Megace. Patient reports changing 4 pads a day every day. Although, she reports this bleeding pattern as being a significant improvement from previous, she still required IV iron transfusion for anemia.  Past Medical History  Diagnosis Date  . High blood pressure   . GERD (gastroesophageal reflux disease)   . Depression   . Neuromuscular disorder   . Anxiety   . Asthma     seasonal related/ albuterol used when uri  . Headache(784.0)   . H/O dizziness   . Arthritis     knee, hands  . Anemia     iv iron treatment dec 2014/ see oncology for this last in dec 2014   Past Surgical History  Procedure Laterality Date  . Gallbladder surgery  04/04/2010  . Tubal ligation    . Cholecystectomy  10/11    lap choli  . Carpal tunnel release  08/30/2011    Procedure: CARPAL TUNNEL RELEASE;  Surgeon: Wynonia Sours, MD;  Location: [place];  Service: Orthopedics;  Laterality: Right;  . Intrauterine device insertion      07/19/2013  . Tonsillectomy and adenoidectomy Bilateral 09/08/2013    Procedure: TONSILLECTOMY ;  Surgeon: Ruby Cola, MD;  Location: [place];  Service: ENT;  Laterality: Bilateral;  . Septoplasty N/A 09/08/2013    Procedure: SEPTOPLASTY;  Surgeon: Ruby Cola, MD;  Location: [facility] OR;  Service: ENT;  Laterality: N/A;   Family History  Problem Relation Age of Onset  . Heart disease    . Diabetes    . Hypertension    . Alcohol abuse    . Diabetes Mother   . Hypertension Father    History  Substance Use Topics  . Smoking status: Current Some Day Smoker -- 0.50 packs/day    Types: Cigarettes  . Smokeless tobacco: Never Used  . Alcohol Use: 0.0 oz/week     Comment: rare   GENERAL: Well-developed, well-nourished female in no acute distress.  ABDOMEN: Soft, nontender, nondistended. No organomegaly. PELVIC: Normal external female  genitalia. Vagina is pink and rugated.  Normal discharge. Normal appearing cervix. IUD strings in place EXTREMITIES: No cyanosis, clubbing, or edema, 2+ distal pulses.  A/P 43 yo G3P3 with persistent DUB - Discussed surgical management with endometrial ablation - Risks, benefits and alternatives were explained including but not limited to risks of bleeding, infection, uterine perforation, and damage to adjacent organs. Patient verbalized understanding and all questions were answered - Patient will be scheduled for endometrial ablation

## 2014-01-27 NOTE — Progress Notes (Signed)
Pt states she has bleeding everyday.

## 2014-02-03 ENCOUNTER — Encounter (HOSPITAL_COMMUNITY): Payer: Self-pay

## 2014-02-03 ENCOUNTER — Encounter (HOSPITAL_COMMUNITY)
Admission: RE | Admit: 2014-02-03 | Discharge: 2014-02-03 | Disposition: A | Discharge status: Home / Self Care | Payer: No Typology Code available for payment source | Source: Ambulatory Visit | Attending: Obstetrics and Gynecology | Admitting: Obstetrics and Gynecology

## 2014-02-03 DIAGNOSIS — Z01812 Encounter for preprocedural laboratory examination: Secondary | ICD-10-CM | POA: Insufficient documentation

## 2014-02-03 DIAGNOSIS — Z01818 Encounter for other preprocedural examination: Secondary | ICD-10-CM | POA: Insufficient documentation

## 2014-02-03 LAB — BASIC METABOLIC PANEL
ANION GAP: 11 (ref 5–15)
BUN: 13 mg/dL (ref 6–23)
CALCIUM: 9.5 mg/dL (ref 8.4–10.5)
CO2: 23 meq/L (ref 19–32)
Chloride: 104 mEq/L (ref 96–112)
Creatinine, Ser: 0.87 mg/dL (ref 0.50–1.10)
GFR calc Af Amer: >90 mL/min (ref >90)
GFR calc non Af Amer: 80 mL/min — ABNORMAL LOW (ref >90)
Glucose, Bld: 100 mg/dL — ABNORMAL HIGH (ref 70–99)
Potassium: 4.5 mEq/L (ref 3.7–5.3)
Sodium: 138 mEq/L (ref 137–147)

## 2014-02-03 LAB — CBC
HEMATOCRIT: 40.3 % (ref 36.0–46.0)
Hemoglobin: 13.2 g/dL (ref 12.0–15.0)
MCH: 20.1 pg — AB (ref 26.0–34.0)
MCHC: 32.8 g/dL (ref 30.0–36.0)
MCV: 61.3 fL — AB (ref 78.0–100.0)
PLATELETS: 427 10*3/uL — AB (ref 150–400)
RBC: 6.57 MIL/uL — ABNORMAL HIGH (ref 3.87–5.11)
RDW: 25.9 % — AB (ref 11.5–15.5)
WBC: 9 10*3/uL (ref 4.0–10.5)

## 2014-02-03 NOTE — Patient Instructions (Signed)
Your procedure is scheduled on:02/08/14  Enter through the Main Entrance at :1:15 pm Pick up desk phone and dial [phone] and inform us of your arrival.  Please call [phone] if you have any problems the morning of surgery.  Remember: Do not eat food after midnight : Monday Clear liquids are ok until:1030 am   You may brush your teeth the morning of surgery.  Take these meds the morning of surgery with a sip of water: your usual prescribed meds   DO NOT wear jewelry, eye make-up, lipstick,body lotion, or dark fingernail polish.  (Polished toes are ok) You may wear deodorant.  If you are to be admitted after surgery, leave suitcase in car until your room has been assigned. Patients discharged on the day of surgery will not be allowed to drive home. Wear loose fitting, comfortable clothes for your ride home.

## 2014-02-08 ENCOUNTER — Encounter (HOSPITAL_COMMUNITY): Payer: Self-pay | Admitting: Anesthesiology

## 2014-02-08 ENCOUNTER — Encounter (HOSPITAL_COMMUNITY)
Admission: RE | Disposition: A | Discharge status: Home / Self Care | Payer: Self-pay | Source: Ambulatory Visit | Attending: Obstetrics and Gynecology

## 2014-02-08 ENCOUNTER — Ambulatory Visit (HOSPITAL_COMMUNITY)
Admission: RE | Admit: 2014-02-08 | Discharge: 2014-02-08 | Disposition: A | Discharge status: Home / Self Care | Payer: Self-pay | Source: Ambulatory Visit | Attending: Obstetrics and Gynecology | Admitting: Obstetrics and Gynecology

## 2014-02-08 ENCOUNTER — Ambulatory Visit (HOSPITAL_COMMUNITY): Payer: No Typology Code available for payment source

## 2014-02-08 ENCOUNTER — Ambulatory Visit (HOSPITAL_COMMUNITY): Payer: Self-pay | Admitting: Anesthesiology

## 2014-02-08 DIAGNOSIS — F329 Major depressive disorder, single episode, unspecified: Secondary | ICD-10-CM | POA: Insufficient documentation

## 2014-02-08 DIAGNOSIS — J45909 Unspecified asthma, uncomplicated: Secondary | ICD-10-CM | POA: Insufficient documentation

## 2014-02-08 DIAGNOSIS — I10 Essential (primary) hypertension: Secondary | ICD-10-CM | POA: Insufficient documentation

## 2014-02-08 DIAGNOSIS — M1289 Other specific arthropathies, not elsewhere classified, multiple sites: Secondary | ICD-10-CM | POA: Insufficient documentation

## 2014-02-08 DIAGNOSIS — N920 Excessive and frequent menstruation with regular cycle: Secondary | ICD-10-CM | POA: Insufficient documentation

## 2014-02-08 DIAGNOSIS — K219 Gastro-esophageal reflux disease without esophagitis: Secondary | ICD-10-CM | POA: Insufficient documentation

## 2014-02-08 DIAGNOSIS — D251 Intramural leiomyoma of uterus: Secondary | ICD-10-CM | POA: Insufficient documentation

## 2014-02-08 DIAGNOSIS — F411 Generalized anxiety disorder: Secondary | ICD-10-CM | POA: Insufficient documentation

## 2014-02-08 DIAGNOSIS — N921 Excessive and frequent menstruation with irregular cycle: Secondary | ICD-10-CM

## 2014-02-08 DIAGNOSIS — F3289 Other specified depressive episodes: Secondary | ICD-10-CM | POA: Insufficient documentation

## 2014-02-08 DIAGNOSIS — D252 Subserosal leiomyoma of uterus: Secondary | ICD-10-CM | POA: Insufficient documentation

## 2014-02-08 HISTORY — PX: HYSTEROSCOPY WITH NOVASURE: SHX5574

## 2014-02-08 HISTORY — PX: HYSTEROSCOPY: SHX211

## 2014-02-08 LAB — PREGNANCY, URINE: PREG TEST UR: NEGATIVE

## 2014-02-08 SURGERY — HYSTEROSCOPY WITH NOVASURE
Anesthesia: General | Site: Vagina

## 2014-02-08 MED ORDER — MEPERIDINE HCL 25 MG/ML IJ SOLN: 6.2500 mg | INTRAMUSCULAR | Status: DC | PRN

## 2014-02-08 MED ORDER — ACETAMINOPHEN-CODEINE #3 300-30 MG PO TABS: 1.0000 | ORAL_TABLET | ORAL | Status: DC | PRN

## 2014-02-08 MED ORDER — LACTATED RINGERS IV SOLN
INTRAVENOUS | Status: DC
  Administered 2014-02-08: 14:00:00 via INTRAVENOUS

## 2014-02-08 MED ORDER — MIDAZOLAM HCL 2 MG/2ML IJ SOLN
INTRAMUSCULAR | Status: AC
  Filled 2014-02-08: qty 2

## 2014-02-08 MED ORDER — PROPOFOL 10 MG/ML IV BOLUS
INTRAVENOUS | Status: DC | PRN
  Administered 2014-02-08: 50 mg via INTRAVENOUS
  Administered 2014-02-08: 200 mg via INTRAVENOUS
  Administered 2014-02-08: 50 mg via INTRAVENOUS

## 2014-02-08 MED ORDER — DEXAMETHASONE SODIUM PHOSPHATE 10 MG/ML IJ SOLN
INTRAMUSCULAR | Status: AC
  Filled 2014-02-08: qty 1

## 2014-02-08 MED ORDER — LACTATED RINGERS IR SOLN
Status: DC | PRN
  Administered 2014-02-08: 800 mL

## 2014-02-08 MED ORDER — KETOROLAC TROMETHAMINE 30 MG/ML IJ SOLN
INTRAMUSCULAR | Status: AC
  Filled 2014-02-08: qty 1

## 2014-02-08 MED ORDER — BUPIVACAINE-EPINEPHRINE 0.5% -1:200000 IJ SOLN
INTRAMUSCULAR | Status: DC | PRN
  Administered 2014-02-08: 10 mL

## 2014-02-08 MED ORDER — MIDAZOLAM HCL 2 MG/2ML IJ SOLN
INTRAMUSCULAR | Status: DC | PRN
  Administered 2014-02-08: 2 mg via INTRAVENOUS

## 2014-02-08 MED ORDER — FENTANYL CITRATE 0.05 MG/ML IJ SOLN
INTRAMUSCULAR | Status: AC
  Filled 2014-02-08: qty 2

## 2014-02-08 MED ORDER — GLYCOPYRROLATE 0.2 MG/ML IJ SOLN
INTRAMUSCULAR | Status: DC | PRN
  Administered 2014-02-08: 0.2 mg via INTRAVENOUS

## 2014-02-08 MED ORDER — LIDOCAINE HCL (CARDIAC) 20 MG/ML IV SOLN
INTRAVENOUS | Status: DC | PRN
  Administered 2014-02-08: 100 mg via INTRAVENOUS

## 2014-02-08 MED ORDER — GLYCOPYRROLATE 0.2 MG/ML IJ SOLN
INTRAMUSCULAR | Status: AC
  Filled 2014-02-08: qty 1

## 2014-02-08 MED ORDER — METOCLOPRAMIDE HCL 5 MG/ML IJ SOLN: 10.0000 mg | Freq: Once | INTRAMUSCULAR | Status: DC | PRN

## 2014-02-08 MED ORDER — KETOROLAC TROMETHAMINE 30 MG/ML IJ SOLN
INTRAMUSCULAR | Status: DC | PRN
  Administered 2014-02-08: 30 mg via INTRAVENOUS

## 2014-02-08 MED ORDER — DEXAMETHASONE SODIUM PHOSPHATE 10 MG/ML IJ SOLN
INTRAMUSCULAR | Status: DC | PRN
  Administered 2014-02-08: 8 mg via INTRAVENOUS

## 2014-02-08 MED ORDER — ONDANSETRON HCL 4 MG/2ML IJ SOLN
INTRAMUSCULAR | Status: DC | PRN
  Administered 2014-02-08: 4 mg via INTRAVENOUS

## 2014-02-08 MED ORDER — SCOPOLAMINE 1 MG/3DAYS TD PT72
MEDICATED_PATCH | TRANSDERMAL | Status: AC
  Administered 2014-02-08: 1.5 mg via TRANSDERMAL
  Filled 2014-02-08: qty 1

## 2014-02-08 MED ORDER — ONDANSETRON HCL 4 MG/2ML IJ SOLN
INTRAMUSCULAR | Status: AC
  Filled 2014-02-08: qty 2

## 2014-02-08 MED ORDER — CHLOROPROCAINE HCL 1 % IJ SOLN
INTRAMUSCULAR | Status: AC
  Filled 2014-02-08: qty 30

## 2014-02-08 MED ORDER — BUPIVACAINE HCL (PF) 0.5 % IJ SOLN
INTRAMUSCULAR | Status: AC
  Filled 2014-02-08: qty 30

## 2014-02-08 MED ORDER — FENTANYL CITRATE 0.05 MG/ML IJ SOLN
INTRAMUSCULAR | Status: DC | PRN
  Administered 2014-02-08 (×2): 25 ug via INTRAVENOUS
  Administered 2014-02-08: 50 ug via INTRAVENOUS

## 2014-02-08 MED ORDER — LIDOCAINE HCL (CARDIAC) 20 MG/ML IV SOLN
INTRAVENOUS | Status: AC
  Filled 2014-02-08: qty 5

## 2014-02-08 MED ORDER — SCOPOLAMINE 1 MG/3DAYS TD PT72
1.0000 | MEDICATED_PATCH | Freq: Once | TRANSDERMAL | Status: AC
  Administered 2014-02-08: 1 via TRANSDERMAL
  Administered 2014-02-08: 1.5 mg via TRANSDERMAL

## 2014-02-08 MED ORDER — BUPIVACAINE HCL (PF) 0.25 % IJ SOLN
INTRAMUSCULAR | Status: AC
  Filled 2014-02-08: qty 30

## 2014-02-08 MED ORDER — FENTANYL CITRATE 0.05 MG/ML IJ SOLN: 25.0000 ug | INTRAMUSCULAR | Status: DC | PRN

## 2014-02-08 SURGICAL SUPPLY — 18 items
ABLATOR ENDOMETRIAL BIPOLAR (ABLATOR) ×5 IMPLANT
CATH ROBINSON RED A/P 16FR (CATHETERS) ×3 IMPLANT
CLOTH BEACON ORANGE TIMEOUT ST (SAFETY) ×3 IMPLANT
CONTAINER PREFILL 10% NBF 60ML (FORM) ×2 IMPLANT
DRAPE HYSTEROSCOPY (DRAPE) ×3 IMPLANT
DRSG TELFA 3X8 NADH (GAUZE/BANDAGES/DRESSINGS) ×3 IMPLANT
GLOVE BIO SURGEON STRL SZ 6.5 (GLOVE) ×2 IMPLANT
GLOVE BIO SURGEONS STRL SZ 6.5 (GLOVE) ×1
GLOVE BIOGEL PI IND STRL 7.0 (GLOVE) ×1 IMPLANT
GLOVE BIOGEL PI INDICATOR 7.0 (GLOVE) ×2
GOWN STRL REUS W/TWL LRG LVL3 (GOWN DISPOSABLE) ×9 IMPLANT
PACK VAGINAL MINOR WOMEN LF (CUSTOM PROCEDURE TRAY) ×3 IMPLANT
PAD DRESSING TELFA 3X8 NADH (GAUZE/BANDAGES/DRESSINGS) ×1 IMPLANT
PAD OB MATERNITY 4.3X12.25 (PERSONAL CARE ITEMS) ×3 IMPLANT
SET TUBING HYSTEROSCOPY 2 NDL (TUBING) ×2 IMPLANT
TOWEL OR 17X24 6PK STRL BLUE (TOWEL DISPOSABLE) ×6 IMPLANT
TUBE HYSTEROSCOPY W Y-CONNECT (TUBING) ×2 IMPLANT
WATER STERILE IRR 1000ML POUR (IV SOLUTION) ×3 IMPLANT

## 2014-02-08 NOTE — Anesthesia Postprocedure Evaluation (Signed)
  Anesthesia Post-op Note  Patient: Cynthia Becker  Procedure(s) Performed: Procedure(s): ATTEMPTED NOVASURE ABLATION (N/A) HYSTEROSCOPY (N/A)  Patient Location: PACU  Anesthesia Type:General  Level of Consciousness: awake, alert  and oriented  Airway and Oxygen Therapy: Patient Spontanous Breathing and Patient connected to nasal cannula oxygen  Post-op Pain: none  Post-op Assessment: Post-op Vital signs reviewed, Patient's Cardiovascular Status Stable, Respiratory Function Stable, No headache, No backache, No residual numbness and No residual motor weakness  Post-op Vital Signs: Reviewed and stable  Last Vitals:  Filed Vitals:   02/08/14 1320  BP: 147/86  Pulse: 77  Temp: 37.1 C  Resp: 16    Complications: No apparent anesthesia complications

## 2014-02-08 NOTE — H&P (Addendum)
Cynthia Becker is an 43 y.o. female G3P3 with persistent DUB s/p failed medical management with Mirena IUD and megace here for surgical intervention with endometrial ablation   Menstrual History: Patient's last menstrual period was 01/22/2014.    Past Medical History  Diagnosis Date  . High blood pressure   . GERD (gastroesophageal reflux disease)   . Depression   . Anxiety   . Asthma     seasonal related/ albuterol used when uri  . Headache(784.0)   . H/O dizziness   . Arthritis     knee, hands  . Anemia     iv iron treatment dec 2014/ see oncology for this last in dec 2014    Past Surgical History  Procedure Laterality Date  . Gallbladder surgery  04/04/2010  . Tubal ligation    . Cholecystectomy  10/11    lap choli  . Carpal tunnel release  08/30/2011    Procedure: CARPAL TUNNEL RELEASE;  Surgeon: Wynonia Sours, MD;  Location: [place];  Service: Orthopedics;  Laterality: Right;  . Intrauterine device insertion      07/19/2013  . Tonsillectomy and adenoidectomy Bilateral 09/08/2013    Procedure: TONSILLECTOMY ;  Surgeon: Ruby Cola, MD;  Location: [place];  Service: ENT;  Laterality: Bilateral;  . Septoplasty N/A 09/08/2013    Procedure: SEPTOPLASTY;  Surgeon: Ruby Cola, MD;  Location: [facility] OR;  Service: ENT;  Laterality: N/A;    Family History  Problem Relation Age of Onset  . Heart disease    . Diabetes    . Hypertension    . Alcohol abuse    . Diabetes Mother   . Hypertension Father     Social History:  reports that she has been smoking Cigarettes.  She has been smoking about 0.25 packs per day. She has never used smokeless tobacco. She reports that she drinks alcohol. She reports that she does not use illicit drugs.  Allergies: No Known Allergies  No prescriptions prior to admission    Review of Systems  All other systems reviewed and are negative.   Last menstrual period 01/22/2014. Physical Exam GENERAL: Well-developed,  well-nourished female in no acute distress.  HEENT: Normocephalic, atraumatic. Sclerae anicteric.  NECK: Supple. Normal thyroid.  LUNGS: Clear to auscultation bilaterally.  HEART: Regular rate and rhythm. ABDOMEN: Soft, nontender, nondistended. No organomegaly. PELVIC: Deferred to OR EXTREMITIES: No cyanosis, clubbing, or edema, 2+ distal pulses.   No results found for this or any previous visit (from the past 24 hour(s)).  No results found. 04/2013 Endometrial biopsy negative for hyperplasia or malignancy  04/2013 FINDINGS:  Uterus  Measurements: 14.2 x 8.2 x 7.3 cm. Multiple uterine fibroids. Three  dominant uterine fibroids include:  --4.6 x 2.7 x 2.6 cm subserosal right anterior uterine fundal  fibroid  --2.8 x 2.1 x 2.9 cm intramural left anterior uterine body fibroid  --2.4 x 1.9 x 2.4 cm intramural right posterior uterine body fibroid  Endometrium  Thickness: 9 mm. Poorly visualized.  Right ovary  Measurements: 3.4 x 1.7 x 2.5 cm. Only visualized transabdominally.  No adnexal mass is seen.  Left ovary  Measurements: 3.3 x 2.1 x 2.0 cm. Only visualized transabdominally.  No adnexal mass is seen.  Other findings  No free fluid.  IMPRESSION:  Multiple uterine fibroids, measuring up to 4.6 cm, as described  above.  Endometrial complex measures 9 mm.   Assessment/Plan: 43 yo G3P3 with DUB here for scheduled endometrial ablation - Risks, benefits and  alternatives were explained including but not limited to risks of bleeding, infection, uterine perforation and damage to adjacent organs. Patient verbalized understanding and all questions were answered  CONSTANT,PEGGY 02/08/2014, 12:03 PM I reviewed her H&P and the plan to remove IUD and do endometrial ablation with NovaSure. Questions were answered.  Woodroe Mode, MD 02/08/2014

## 2014-02-08 NOTE — Anesthesia Preprocedure Evaluation (Addendum)
Anesthesia Evaluation  Patient identified by MRN, date of birth, ID band Patient awake    Reviewed: Allergy & Precautions, H&P , NPO status , Patient's Chart, lab work & pertinent test results  Airway Mallampati: II TM Distance: >3 FB Neck ROM: Full    Dental no notable dental hx. (+) Teeth Intact   Pulmonary asthma , Current Smoker,  breath sounds clear to auscultation  Pulmonary exam normal       Cardiovascular hypertension, Pt. on medications negative cardio ROS  Rhythm:Regular Rate:Normal     Neuro/Psych  Headaches, PSYCHIATRIC DISORDERS Anxiety Depression  Neuromuscular disease    GI/Hepatic Neg liver ROS, GERD-  Medicated and Controlled,  Endo/Other  Morbid obesity  Renal/GU negative Renal ROS  negative genitourinary   Musculoskeletal  (+) Arthritis -, Osteoarthritis,  Low Back pain DDD Lumbar spine    Abdominal (+) + obese,   Peds  Hematology  (+) Sickle cell trait and anemia , Hb C trait   Anesthesia Other Findings   Reproductive/Obstetrics Menorrhagia                        Anesthesia Physical Anesthesia Plan  ASA: III  Anesthesia Plan: General   Post-op Pain Management:    Induction: Intravenous  Airway Management Planned: LMA  Additional Equipment:   Intra-op Plan:   Post-operative Plan: Extubation in OR  Informed Consent: I have reviewed the patients History and Physical, chart, labs and discussed the procedure including the risks, benefits and alternatives for the proposed anesthesia with the patient or authorized representative who has indicated his/her understanding and acceptance.   Dental advisory given  Plan Discussed with: CRNA, Anesthesiologist and Surgeon  Anesthesia Plan Comments: (Patient does want regional anesthesia.)       Anesthesia Quick Evaluation

## 2014-02-08 NOTE — Anesthesia Postprocedure Evaluation (Signed)
  Anesthesia Post-op Note  Patient: Cynthia Becker  Procedure(s) Performed: Procedure(s): ATTEMPTED NOVASURE ABLATION (N/A) HYSTEROSCOPY (N/A) Patient is awake and responsive. Pain and nausea are reasonably well controlled. Vital signs are stable and clinically acceptable. Oxygen saturation is clinically acceptable. There are no apparent anesthetic complications at this time. Patient is ready for discharge.

## 2014-02-08 NOTE — Transfer of Care (Signed)
Immediate Anesthesia Transfer of Care Note  Patient: Cynthia Becker  Procedure(s) Performed: Procedure(s): ATTEMPTED NOVASURE ABLATION (N/A) HYSTEROSCOPY (N/A)  Patient Location: PACU  Anesthesia Type:General  Level of Consciousness: awake, alert  and oriented  Airway & Oxygen Therapy: Patient Spontanous Breathing and Patient connected to nasal cannula oxygen  Post-op Assessment: Report given to PACU RN and Post -op Vital signs reviewed and stable  Post vital signs: Reviewed and stable  Complications: No apparent anesthesia complications

## 2014-02-08 NOTE — Op Note (Addendum)
Hysterectomy Procedure Note  Indications: Menometrorrhagia  Pre-operative Diagnosis: Menometrorrhagia unresponsive to Megace and Mirena IUD  Post-operative Diagnosis:  Previous expulsion of Mirena IUD, normal uterine cavity  Operation: Diagnostic hysteroscopy, attempted endometrial ablation  Surgeon: Ayomide Purdy   Assistants:  None  Anesthesia: LMA  ASA Class: 2  Procedure Details  The patient was seen in the Holding Room. The risks, benefits, complications, treatment options, and expected outcomes were discussed with the patient.  The patient concurred with the proposed plan, giving informed consent.  The site of surgery properly noted/marked. The patient was taken to Operating Room # 4, identified as Cynthia Becker and the procedure verified as removal of Mirena and endometrial ablation with NovaSure. A Time Out was held and the above information confirmed.  After induction of anesthesia, the patient was draped and prepped in the usual sterile manner. Pt was placed in lithotomy position after anesthesia and draped and prepped in the usual sterile manner. Bladder was drained with a red rubber catheter. Speculum was inserted and the cervix was visualized. Half percent Marcaine was infiltrated for intracervical block with 10 mL. Cervix was grasped with single-tooth tenaculum. The IUD strings were not visible. Cervix was dilated to a #9 Hegar dilator. The uterine cavity was explored with uterine dressing forceps and the IUD was identified. Diagnostic hysteroscope was used with video camera and LR as the distending medium. Uterine cavity appeared normal both tubal ostia were seen and the IUD was not visible. The uterine cavity sounded to 10 cm and the cervix to 4 cm giving a 6 cm cavity length. The NovaSure device was inserted and deployed to 2.3 cm. Cavity assessment failed on more than one occasion and a second device was used which again was unable to pass the cavity assessment. Ablation was  not performed. Device was removed intact. Allis was removed. There was minimal bleeding. A flatplate abdominal x-ray showed that the intrauterine device was not present.  Findings: The Mirena was not in place at the beginning of the procedure and the endometrial cavity appeared normal on hysteroscopy  Estimated Blood Loss:  Negligible         Drains: None         Total IV Fluids:573ml         Specimens: None         Implants: None         Complications:  None; patient tolerated the procedure well.         Disposition: PACU - hemodynamically stable.         Condition: stable  Attending Attestation: I was present and scrubbed for the entire procedure.  Dr. [name] 02/08/2014 1700

## 2014-02-08 NOTE — Discharge Instructions (Addendum)

## 2014-02-09 ENCOUNTER — Encounter (HOSPITAL_COMMUNITY): Payer: Self-pay | Admitting: Obstetrics & Gynecology

## 2014-03-01 ENCOUNTER — Other Ambulatory Visit: Payer: Self-pay | Admitting: Oncology

## 2014-03-01 DIAGNOSIS — N921 Excessive and frequent menstruation with irregular cycle: Secondary | ICD-10-CM

## 2014-03-01 DIAGNOSIS — D500 Iron deficiency anemia secondary to blood loss (chronic): Secondary | ICD-10-CM

## 2014-03-07 ENCOUNTER — Telehealth: Payer: Self-pay | Admitting: Oncology

## 2014-03-07 ENCOUNTER — Encounter: Payer: Self-pay | Admitting: Oncology

## 2014-03-07 ENCOUNTER — Other Ambulatory Visit (HOSPITAL_BASED_OUTPATIENT_CLINIC_OR_DEPARTMENT_OTHER): Payer: Self-pay

## 2014-03-07 ENCOUNTER — Ambulatory Visit (HOSPITAL_BASED_OUTPATIENT_CLINIC_OR_DEPARTMENT_OTHER): Payer: Self-pay | Admitting: Oncology

## 2014-03-07 VITALS — BP 144/85 | HR 84 | Temp 98.4°F | Resp 20 | Ht 65.0 in | Wt 340.8 lb

## 2014-03-07 DIAGNOSIS — D500 Iron deficiency anemia secondary to blood loss (chronic): Secondary | ICD-10-CM

## 2014-03-07 DIAGNOSIS — D582 Other hemoglobinopathies: Secondary | ICD-10-CM

## 2014-03-07 DIAGNOSIS — K5900 Constipation, unspecified: Secondary | ICD-10-CM

## 2014-03-07 DIAGNOSIS — D509 Iron deficiency anemia, unspecified: Secondary | ICD-10-CM

## 2014-03-07 DIAGNOSIS — D473 Essential (hemorrhagic) thrombocythemia: Secondary | ICD-10-CM

## 2014-03-07 DIAGNOSIS — N920 Excessive and frequent menstruation with regular cycle: Secondary | ICD-10-CM

## 2014-03-07 DIAGNOSIS — Z23 Encounter for immunization: Secondary | ICD-10-CM

## 2014-03-07 DIAGNOSIS — N921 Excessive and frequent menstruation with irregular cycle: Secondary | ICD-10-CM

## 2014-03-07 LAB — CBC WITH DIFFERENTIAL/PLATELET
BASO%: 1.2 % (ref 0.0–2.0)
Basophils Absolute: 0.1 10*3/uL (ref 0.0–0.1)
EOS%: 11.4 % — AB (ref 0.0–7.0)
Eosinophils Absolute: 1 10*3/uL — ABNORMAL HIGH (ref 0.0–0.5)
HCT: 39 % (ref 34.8–46.6)
HGB: 12.4 g/dL (ref 11.6–15.9)
LYMPH#: 2.7 10*3/uL (ref 0.9–3.3)
LYMPH%: 29.7 % (ref 14.0–49.7)
MCH: 20.2 pg — AB (ref 25.1–34.0)
MCHC: 31.9 g/dL (ref 31.5–36.0)
MCV: 63.1 fL — ABNORMAL LOW (ref 79.5–101.0)
MONO#: 0.5 10*3/uL (ref 0.1–0.9)
MONO%: 6 % (ref 0.0–14.0)
NEUT#: 4.7 10*3/uL (ref 1.5–6.5)
NEUT%: 51.7 % (ref 38.4–76.8)
Platelets: 413 10*3/uL — ABNORMAL HIGH (ref 145–400)
RBC: 6.17 10*6/uL — ABNORMAL HIGH (ref 3.70–5.45)
RDW: 22.3 % — AB (ref 11.2–14.5)
WBC: 9.1 10*3/uL (ref 3.9–10.3)

## 2014-03-07 LAB — IRON AND TIBC CHCC
%SAT: 8 % — ABNORMAL LOW (ref 21–57)
Iron: 30 ug/dL — ABNORMAL LOW (ref 41–142)
TIBC: 390 ug/dL (ref 236–444)
UIBC: 360 ug/dL (ref 120–384)

## 2014-03-07 LAB — PROTIME-INR
INR: 1 — AB (ref 2.00–3.50)
Protime: 12 Seconds (ref 10.6–13.4)

## 2014-03-07 LAB — APTT: aPTT: 27 seconds (ref 24–37)

## 2014-03-07 LAB — FERRITIN CHCC: FERRITIN: 15 ng/mL (ref 9–269)

## 2014-03-07 MED ORDER — INFLUENZA VAC SPLIT QUAD 0.5 ML IM SUSY
0.5000 mL | PREFILLED_SYRINGE | Freq: Once | INTRAMUSCULAR | Status: AC
  Administered 2014-03-07: 0.5 mL via INTRAMUSCULAR
  Filled 2014-03-07: qty 0.5

## 2014-03-07 NOTE — Progress Notes (Signed)
OFFICE PROGRESS NOTE   03/07/2014   Physicians: Mora Bellman [name], Juluis Mire (PCP [facility])  INTERVAL HISTORY:  Patient is seen, alone for visit, in continuing attention to iron deficiency anemia due to menometrorrhagia, for which she has required IV feraheme 04-2013 and again 11-26-13 in addition to oral ferrous gluconate. The uterine bleeding has not responded to Megace (still on 40 mg daily by gyn) or to [place] IUD. She had hysteroscopy by Dr Roselie Awkward on 02-08-14, with IUD not found and normal appearing endometrial cavity with minimal bleeding at that procedure. Patient reports that ~ a month prior to the procedure she had markedly more heavy bleeding with large clots, possibly the IUD passed then. She has continual vaginal spotting daily now, but is down to just one pad daily. She is to see gyn again on 03-17-14. She tells me that she would be willing to have hysterectomy if that is recommended. She has no other bleeding. Appetite is increased with the Megace, with weight up 17 lbs since I saw her last in early June, despite her attempts to watch po intake. Energy has been better, including up on ladder last week to clean cabinets, but fell onto right knee with soreness since then.  She is to see PCP Juluis Mire later this month.  She does not have central catheter. She was given flu vaccine today. She continues to smoke, tho not daily, which we have discussed again now and I have strongly encouraged her to stop this entirely.    HISTORY Patient was seen at [facility] on 01-04-2013 for problems including fatigue and heavy menses. CBC had WBC 6.2, Hgb 8.1, MCV 51.3, plt 752, diff normal except 7% eos, iron 11, %sat 3, and hemoglobin electrophoresis with Hgb A 69, F 0, A2 2.1 and C 28.7. Sickle solubility test negative. 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 She was begun on ferrous sulfate po in August, which she  has been taking with meals once daily and is not tolerating well due to constipation and nausea. Repeat CBC on 03-05-13 had WBC 6.8, Hgb 9.2, MCV 56 and plt 731k., automated diff ok except eos 7%.  Patient recalls having been anemic x years, last on oral iron 2 years prior to referral to this office. For past ~ 7-8 months her menstrual periods remain regular but have been much heavier than usual for at least 5 days, and additional bleeding for ~ 3 days. She used at least 18 super tampons during first 2 days of each period. She is not aware of other bleeding. She was noticeably fatigued with even nonstrenuous activity at time of referral to this office..  She has never been transfused.  She received IV feraheme 1020 mg on 04-30-13. She had endometrial biopsy with benign findings on 04-28-13, and IUD placed 07-19-13, then began Megace by gynecology on 08-26-13 due to ongoing bleeding. She had IV feraheme 1020 mg again on 11-26-13 for recurrent iron deficiency anemia in setting of ongoing vaginal bleeding. Diagnostic hysteroscopy was done 02-08-14, with IUD no longer in place and normal appearance to endometrial cavity.     Review of systems as above, also: No other bleeding. Puritic skin areas arms and lower legs x several weeks. Right knee pain since fall, gradually improving. No fever or symptoms of infection Remainder of 10 point Review of Systems negative.  Objective:  Vital signs in last 24 hours:  BP 144/85  Pulse 84  Temp(Src)  98.4 F (36.9 C) (Oral)  Resp 20  Ht 5\' 5"  (1.651 m)  Wt 340 lb 12.8 oz (154.586 kg)  BMI 56.71 kg/m2 Weight is up 17 lbs since 11-24-13. Alert, oriented and appropriate. Ambulatory without assistance, favoring  knee.Marland Kitchen    HEENT:PERRL, sclerae not icteric. Oral mucosa moist without lesions, posterior pharynx clear.  Neck supple. No JVD.  Lymphatics:no cervical,suraclavicular, axillary or inguinal adenopathy Resp: clear to auscultation bilaterally and normal percussion  bilaterally Cardio: regular rate and rhythm. No gallop. GI: abdomen obese, soft, nontender, not obviously distended, no appreciable mass or organomegaly. Normally active bowel sounds.  Musculoskeletal/ Extremities: without pitting edema, cords. Right knee without heat, erythema or obvious effusion, no ecchymosis. Neuro:  nonfocal PSYCH appropriate mood and affect Skin without ecchymosis, petechiae. Excoriated skin areas UE and upper back, no clear rash.   Lab Results:  Results for orders placed in visit on 03/07/14  CBC WITH DIFFERENTIAL      Result Value Ref Range   WBC 9.1  3.9 - 10.3 10e3/uL   NEUT# 4.7  1.5 - 6.5 10e3/uL   HGB 12.4  11.6 - 15.9 g/dL   HCT 39.0  34.8 - 46.6 %   Platelets 413 (*) 145 - 400 10e3/uL   MCV 63.1 (*) 79.5 - 101.0 fL   MCH 20.2 (*) 25.1 - 34.0 pg   MCHC 31.9  31.5 - 36.0 g/dL   RBC 6.17 (*) 3.70 - 5.45 10e6/uL   RDW 22.3 (*) 11.2 - 14.5 %   lymph# 2.7  0.9 - 3.3 10e3/uL   MONO# 0.5  0.1 - 0.9 10e3/uL   Eosinophils Absolute 1.0 (*) 0.0 - 0.5 10e3/uL   Basophils Absolute 0.1  0.0 - 0.1 10e3/uL   NEUT% 51.7  38.4 - 76.8 %   LYMPH% 29.7  14.0 - 49.7 %   MONO% 6.0  0.0 - 14.0 %   EOS% 11.4 (*) 0.0 - 7.0 %   BASO% 1.2  0.0 - 2.0 %  PROTIME-INR      Result Value Ref Range   Protime 12.0  10.6 - 13.4 Seconds   INR 1.00 (*) 2.00 - 3.50   Lovenox No    APTT      Result Value Ref Range   aPTT 27  24 - 37 seconds   iron studies available after visit with serum iron up to 30, %sat a little better at 8, ferritin also a little better at 15.  Studies/Results:  Hysteroscopy note from 02-08-14 reviewed in EMR  Medications: I have reviewed the patient's current medications. Continue iron, suggest on empty stomach with OJ or Vit C for best absorption  DISCUSSION: From standpoint of morbid obesity and progressive weight gain, would be best to be off Megace when possible, tho she understands goal of this drug is to decrease gyn bleeding. I will see her back with  labs at least in 6 months, tho she would need hemoglobin/ iron repeated prior to that if more heavy bleeding or more symptoms of anemia.  Assessment/Plan:  1.severe iron deficiency with anemia: heavy gyn blood loss previously and ongoing bleeding despite interventions by gyn so far. Post IV feraheme x 2 in past year and on oral iron. Coags normal.  I will see her back in 6 mo or sooner if needed - see Discussion above  2.thrombocytosis likely related to severe iron deficiency.  3.chronic constipation improved with lactulose, which seems to have helped nausea  4.morbid obesity, BMI 56.8 5.ongoing tobacco: hopefully  will DC this entirely now., and I have suggested she do this prior to upcoming appointment with PCP. 6.hemoglobin C trait, but also clearly iron deficient. 7.flu vaccine given today 8.trauma to right knee: improving, will f/u with PCP at scheduled appointment   Assistance from Drs Constant and Roselie Awkward much appreciated.   LIVESAY,LENNIS P, MD   03/07/2014, 11:15 AM

## 2014-03-07 NOTE — Telephone Encounter (Signed)
per pof to sch appt-[facility] sch not opened for 2016-adv pt will sch and call with appt time & date

## 2014-03-08 ENCOUNTER — Telehealth: Payer: Self-pay | Admitting: *Deleted

## 2014-03-08 NOTE — Telephone Encounter (Signed)
Message copied by Patton Salles on Tue Mar 08, 2014  8:50 AM ------      Message from: Gordy Levan      Created: Tue Mar 08, 2014  7:32 AM       Labs seen and need follow up please let her know iron studies are better from 9-14, now into very low end of normal range and the blood clotting tests were normal.  She needs to continue oral iron every day - I believe she is presently on ferrous gluconate, which med list says she is taking with breakfast. Tell her that better absorption is to take this on empty stomach with either OJ or vitamin C tablet if she can do that. She should call for repeat labs here if further very heavy bleeding or if she feels more weak before scheduled return visit. ------

## 2014-03-08 NOTE — Telephone Encounter (Signed)
Left message to call.

## 2014-03-09 NOTE — Telephone Encounter (Signed)
Spoke with Ms. Mccanless and discussed the lab work and how to take her iron as noted below by Dr. Marko Plume.  Patient verbalized understanding.

## 2014-03-13 ENCOUNTER — Emergency Department (INDEPENDENT_AMBULATORY_CARE_PROVIDER_SITE_OTHER): Payer: No Typology Code available for payment source

## 2014-03-13 ENCOUNTER — Emergency Department (HOSPITAL_COMMUNITY)
Admission: EM | Admit: 2014-03-13 | Discharge: 2014-03-13 | Disposition: A | Discharge status: Home / Self Care | Payer: No Typology Code available for payment source | Source: Home / Self Care | Attending: Family Medicine | Admitting: Family Medicine

## 2014-03-13 ENCOUNTER — Encounter (HOSPITAL_COMMUNITY): Payer: Self-pay | Admitting: Emergency Medicine

## 2014-03-13 DIAGNOSIS — M25561 Pain in right knee: Secondary | ICD-10-CM

## 2014-03-13 DIAGNOSIS — M1710 Unilateral primary osteoarthritis, unspecified knee: Secondary | ICD-10-CM

## 2014-03-13 DIAGNOSIS — M25569 Pain in unspecified knee: Secondary | ICD-10-CM

## 2014-03-13 DIAGNOSIS — M1711 Unilateral primary osteoarthritis, right knee: Secondary | ICD-10-CM

## 2014-03-13 HISTORY — PX: ABDOMINAL HYSTERECTOMY: SHX81

## 2014-03-13 MED ORDER — METHYLPREDNISOLONE ACETATE 40 MG/ML IJ SUSP: 40.0000 mg | Freq: Once | INTRAMUSCULAR | Status: DC

## 2014-03-13 MED ORDER — HYDROCODONE-ACETAMINOPHEN 5-325 MG PO TABS: 1.0000 | ORAL_TABLET | Freq: Four times a day (QID) | ORAL | Status: DC | PRN

## 2014-03-13 MED ORDER — METHYLPREDNISOLONE ACETATE 40 MG/ML IJ SUSP
INTRAMUSCULAR | Status: AC
  Filled 2014-03-13: qty 5

## 2014-03-13 MED ORDER — BUPIVACAINE HCL (PF) 0.5 % IJ SOLN
INTRAMUSCULAR | Status: AC
  Filled 2014-03-13: qty 10

## 2014-03-13 MED ORDER — HYDROCODONE-ACETAMINOPHEN 5-325 MG PO TABS
1.0000 | ORAL_TABLET | Freq: Four times a day (QID) | ORAL | Status: DC | PRN
Start: 2014-03-13 — End: 2014-03-13

## 2014-03-13 NOTE — ED Provider Notes (Signed)
Cynthia Becker is a 43 y.o. female who presents to Urgent Care today for right knee pain. Patient has a three-week history of worsening right knee pain and swelling. Patient fell a few feet 3 weeks ago. She denies any initial pain however the pain is worsened recently. She denies any locking or catching but notes difficulty flexing her knee. The pain is worse with ambulation and better with rest. Radiating pain weakness or numbness. She's tried rest ice heat and elevation as well as ibuprofen which have not worked very well.   Past Medical History  Diagnosis Date  . High blood pressure   . GERD (gastroesophageal reflux disease)   . Depression   . Anxiety   . Asthma     seasonal related/ albuterol used when uri  . Headache(784.0)   . H/O dizziness   . Arthritis     knee, hands  . Anemia     iv iron treatment dec 2014/ see oncology for this last in dec 2014   History  Substance Use Topics  . Smoking status: Current Some Day Smoker -- 0.25 packs/day    Types: Cigarettes  . Smokeless tobacco: Never Used  . Alcohol Use: 0.0 oz/week     Comment: rare   ROS as above Medications: Current Facility-Administered Medications  Medication Dose Route Frequency Provider Last Rate Last Dose  . methylPREDNISolone acetate (DEPO-MEDROL) injection 40 mg  40 mg Intra-articular Once Gregor Hams, MD       Current Outpatient Prescriptions  Medication Sig Dispense Refill  . albuterol (PROVENTIL HFA;VENTOLIN HFA) 108 (90 BASE) MCG/ACT inhaler Inhale 2 puffs into the lungs every 6 (six) hours as needed for wheezing or shortness of breath.       . ARIPiprazole (ABILIFY) 5 MG tablet Take 10 mg by mouth daily.      . beclomethasone (QVAR) 40 MCG/ACT inhaler Inhale 2 puffs into the lungs 2 (two) times daily.      . ferrous gluconate (FERGON) 324 MG tablet Take 324 mg by mouth daily with breakfast.      . HYDROcodone-acetaminophen (NORCO/VICODIN) 5-325 MG per tablet Take 1 tablet by mouth every 6 (six) hours as  needed.  15 tablet  0  . lactulose (CHRONULAC) 10 GM/15ML solution Take 10 g by mouth 2 (two) times daily as needed for mild constipation.      Marland Kitchen levonorgestrel (MIRENA) 20 MCG/24HR IUD 1 each by Intrauterine route once. Was placed in Jan. 2015.      Marland Kitchen lisinopril (PRINIVIL,ZESTRIL) 20 MG tablet Take 20 mg by mouth daily.      Marland Kitchen loratadine-pseudoephedrine (CLARITIN-D 24-HOUR) 10-240 MG per 24 hr tablet Take 1 tablet by mouth daily.      . megestrol (MEGACE) 40 MG tablet TAKE ONE TABLET BY MOUTH ONCE DAILY  30 tablet  0  . Vortioxetine HBr (BRINTELLIX) 20 MG TABS Take 20 mg by mouth daily.      Marland Kitchen zolpidem (AMBIEN) 10 MG tablet Take 10 mg by mouth at bedtime as needed for sleep.        Exam:  BP 161/96  Pulse 97  Temp(Src) 98.6 F (37 C) (Oral)  Resp 16  SpO2 100% Gen: Well NAD morbidly obese HEENT: EOMI,  MMM Lungs: Normal work of breathing. CTABL Heart: RRR no MRG Abd: NABS, Soft. Nondistended, Nontender Exts: Brisk capillary refill, warm and well perfused.  Right knee:  Obese knee was difficult to visualize anatomical landmarks. Probable effusion present. Range of motion 0-100. 1+ retropatellar  crepitations on extension. Stable ligamentous exam.  Knee injection. Right knee Consent obtained and timeout performed. Patient seated in a comfortable position with legs hanging off the table.  The medial Peri-patellar tendon space was palpated and marked. The skin was then cleaned with alcohol. Cold spray applied. A 25-gauge inch and a half needle was used to inject 40 mg of Depo-Medrol and 4 mL of Marcaine. Patient tolerated procedure well no bleeding. Pain improved following injection   No results found for this or any previous visit (from the past 24 hour(s)). Dg Knee Complete 4 Views Right  03/13/2014   CLINICAL DATA:  Right knee pain; status post fall 2 weeks ago.  EXAM: RIGHT KNEE - COMPLETE 4+ VIEW  COMPARISON:  None.  FINDINGS: The bones of the right knee are adequately  mineralized. There is no acute fracture nor dislocation. The joint spaces are reasonably well maintained on this nonweightbearing series. There is no joint effusion. There is mild beaking of the medial tibial spine.  IMPRESSION: There is no acute bony abnormality of the right knee. There are mild degenerative changes.   Electronically Signed   By: David  Martinique   On: 03/13/2014 18:46    Assessment and Plan: 43 y.o. female with right knee pain with effusion likely due to meniscus injury. Corticosteroid injection performed. Followup with sports medicine  Discussed warning signs or symptoms. Please see discharge instructions. Patient expresses understanding.     Gregor Hams, MD 03/13/14 [phone]

## 2014-03-13 NOTE — Discharge Instructions (Signed)
Thank you for coming in today. Followup with [facility] sports medicine Consider losing weight Call or go to the ER if you develop a large red swollen joint with extreme pain or oozing puss.    Arthritis, Nonspecific Arthritis is inflammation of a joint. This usually means pain, redness, warmth or swelling are present. One or more joints may be involved. There are a number of types of arthritis. Your caregiver may not be able to tell what type of arthritis you have right away. CAUSES  The most common cause of arthritis is the wear and tear on the joint (osteoarthritis). This causes damage to the cartilage, which can break down over time. The knees, hips, back and neck are most often affected by this type of arthritis. Other types of arthritis and common causes of joint pain include:  Sprains and other injuries near the joint. Sometimes minor sprains and injuries cause pain and swelling that develop hours later.  Rheumatoid arthritis. This affects hands, feet and knees. It usually affects both sides of your body at the same time. It is often associated with chronic ailments, fever, weight loss and general weakness.  Crystal arthritis. Gout and pseudo gout can cause occasional acute severe pain, redness and swelling in the foot, ankle, or knee.  Infectious arthritis. Bacteria can get into a joint through a break in overlying skin. This can cause infection of the joint. Bacteria and viruses can also spread through the blood and affect your joints.  Drug, infectious and allergy reactions. Sometimes joints can become mildly painful and slightly swollen with these types of illnesses. SYMPTOMS   Pain is the main symptom.  Your joint or joints can also be red, swollen and warm or hot to the touch.  You may have a fever with certain types of arthritis, or even feel overall ill.  The joint with arthritis will hurt with movement. Stiffness is present with some types of arthritis. DIAGNOSIS  Your  caregiver will suspect arthritis based on your description of your symptoms and on your exam. Testing may be needed to find the type of arthritis:  Blood and sometimes urine tests.  X-ray tests and sometimes CT or MRI scans.  Removal of fluid from the joint (arthrocentesis) is done to check for bacteria, crystals or other causes. Your caregiver (or a specialist) will numb the area over the joint with a local anesthetic, and use a needle to remove joint fluid for examination. This procedure is only minimally uncomfortable.  Even with these tests, your caregiver may not be able to tell what kind of arthritis you have. Consultation with a specialist (rheumatologist) may be helpful. TREATMENT  Your caregiver will discuss with you treatment specific to your type of arthritis. If the specific type cannot be determined, then the following general recommendations may apply. Treatment of severe joint pain includes:  Rest.  Elevation.  Anti-inflammatory medication (for example, ibuprofen) may be prescribed. Avoiding activities that cause increased pain.  Only take over-the-counter or prescription medicines for pain and discomfort as recommended by your caregiver.  Cold packs over an inflamed joint may be used for 10 to 15 minutes every hour. Hot packs sometimes feel better, but do not use overnight. Do not use hot packs if you are diabetic without your caregiver's permission.  A cortisone shot into arthritic joints may help reduce pain and swelling.  Any acute arthritis that gets worse over the next 1 to 2 days needs to be looked at to be sure there is  no joint infection. Long-term arthritis treatment involves modifying activities and lifestyle to reduce joint stress jarring. This can include weight loss. Also, exercise is needed to nourish the joint cartilage and remove waste. This helps keep the muscles around the joint strong. HOME CARE INSTRUCTIONS   Do not take aspirin to relieve pain if gout  is suspected. This elevates uric acid levels.  Only take over-the-counter or prescription medicines for pain, discomfort or fever as directed by your caregiver.  Rest the joint as much as possible.  If your joint is swollen, keep it elevated.  Use crutches if the painful joint is in your leg.  Drinking plenty of fluids may help for certain types of arthritis.  Follow your caregiver's dietary instructions.  Try low-impact exercise such as:  Swimming.  Water aerobics.  Biking.  Walking.  Morning stiffness is often relieved by a warm shower.  Put your joints through regular range-of-motion. SEEK MEDICAL CARE IF:   You do not feel better in 24 hours or are getting worse.  You have side effects to medications, or are not getting better with treatment. SEEK IMMEDIATE MEDICAL CARE IF:   You have a fever.  You develop severe joint pain, swelling or redness.  Many joints are involved and become painful and swollen.  There is severe back pain and/or leg weakness.  You have loss of bowel or bladder control. Document Released: 07/18/2004 Document Revised: 09/02/2011 Document Reviewed: 08/03/2008 [facility] Patient Information 2015 [place], [place]. This information is not intended to replace advice given to you by your health care provider. Make sure you discuss any questions you have with your health care provider.

## 2014-03-13 NOTE — ED Notes (Signed)
Patient states she fell off a ladder about three weeks ago and injured her right leg Complains of pain to right knee. Having a hard time walking

## 2014-03-17 ENCOUNTER — Encounter: Payer: Self-pay | Admitting: Obstetrics & Gynecology

## 2014-03-17 ENCOUNTER — Ambulatory Visit (INDEPENDENT_AMBULATORY_CARE_PROVIDER_SITE_OTHER): Payer: No Typology Code available for payment source | Admitting: Obstetrics & Gynecology

## 2014-03-17 VITALS — BP 139/83 | HR 103 | Temp 98.4°F | Ht 65.0 in | Wt 343.7 lb

## 2014-03-17 DIAGNOSIS — N938 Other specified abnormal uterine and vaginal bleeding: Secondary | ICD-10-CM

## 2014-03-17 DIAGNOSIS — D259 Leiomyoma of uterus, unspecified: Secondary | ICD-10-CM

## 2014-03-17 DIAGNOSIS — N949 Unspecified condition associated with female genital organs and menstrual cycle: Secondary | ICD-10-CM

## 2014-03-17 DIAGNOSIS — N921 Excessive and frequent menstruation with irregular cycle: Secondary | ICD-10-CM

## 2014-03-17 DIAGNOSIS — N920 Excessive and frequent menstruation with regular cycle: Secondary | ICD-10-CM

## 2014-03-17 NOTE — Progress Notes (Signed)
Patient ID: [name], female   DOB: 1970-08-15, 43 y.o.   MRN: [id number]  Chief Complaint  Patient presents with  . Routine Post Op    HPI Cynthia Becker is a 43 y.o. female.  [id number] Patient's last menstrual period was 02/13/2014.Had hysteroscopy  And attemped Novasure. IUD had been lost prior to surgery. H/O menorrhagia and anemia. Wants definitive therapy.    HPI  Past Medical History  Diagnosis Date  . High blood pressure   . GERD (gastroesophageal reflux disease)   . Depression   . Anxiety   . Asthma     seasonal related/ albuterol used when uri  . Headache(784.0)   . H/O dizziness   . Arthritis     knee, hands  . Anemia     iv iron treatment dec 2014/ see oncology for this last in dec 2014    Past Surgical History  Procedure Laterality Date  . Gallbladder surgery  04/04/2010  . Tubal ligation    . Cholecystectomy  10/11    lap choli  . Carpal tunnel release  08/30/2011    Procedure: CARPAL TUNNEL RELEASE;  Surgeon: Wynonia Sours, MD;  Location: [facility];  Service: Orthopedics;  Laterality: Right;  . Intrauterine device insertion      07/19/2013  . Tonsillectomy and adenoidectomy Bilateral 09/08/2013    Procedure: TONSILLECTOMY ;  Surgeon: Ruby Cola, MD;  Location: [place];  Service: ENT;  Laterality: Bilateral;  . Septoplasty N/A 09/08/2013    Procedure: SEPTOPLASTY;  Surgeon: Ruby Cola, MD;  Location: [facility];  Service: ENT;  Laterality: N/A;  . Hysteroscopy with novasure N/A 02/08/2014    Procedure: ATTEMPTED NOVASURE ABLATION;  Surgeon: Woodroe Mode, MD;  Location: [facility];  Service: Gynecology;  Laterality: N/A;  . Hysteroscopy N/A 02/08/2014    Procedure: HYSTEROSCOPY;  Surgeon: Woodroe Mode, MD;  Location: [facility];  Service: Gynecology;  Laterality: N/A;    Family History  Problem Relation Age of Onset  . Heart disease    . Diabetes    . Hypertension    . Alcohol abuse    . Diabetes Mother   . Hypertension Father     Social  History History  Substance Use Topics  . Smoking status: Current Some Day Smoker -- 0.25 packs/day    Types: Cigarettes  . Smokeless tobacco: Never Used  . Alcohol Use: 0.0 oz/week     Comment: rare    No Known Allergies  Current Outpatient Prescriptions  Medication Sig Dispense Refill  . albuterol (PROVENTIL HFA;VENTOLIN HFA) 108 (90 BASE) MCG/ACT inhaler Inhale 2 puffs into the lungs every 6 (six) hours as needed for wheezing or shortness of breath.       . beclomethasone (QVAR) 40 MCG/ACT inhaler Inhale 2 puffs into the lungs 2 (two) times daily.      . ferrous gluconate (FERGON) 324 MG tablet Take 324 mg by mouth daily with breakfast.      . HYDROcodone-acetaminophen (NORCO/VICODIN) 5-325 MG per tablet Take 1 tablet by mouth every 6 (six) hours as needed.  15 tablet  0  . lactulose (CHRONULAC) 10 GM/15ML solution Take 10 g by mouth 2 (two) times daily as needed for mild constipation.      Marland Kitchen lisinopril (PRINIVIL,ZESTRIL) 20 MG tablet Take 20 mg by mouth daily.      Marland Kitchen loratadine-pseudoephedrine (CLARITIN-D 24-HOUR) 10-240 MG per 24 hr tablet Take 1 tablet by mouth daily.      . megestrol (  MEGACE) 40 MG tablet TAKE ONE TABLET BY MOUTH ONCE DAILY  30 tablet  0  . mirtazapine (REMERON) 15 MG tablet Take 15 mg by mouth at bedtime.      . Vortioxetine HBr (BRINTELLIX) 20 MG TABS Take 20 mg by mouth daily.       No current facility-administered medications for this visit.    Review of Systems Review of Systems  Constitutional: Negative.   Genitourinary: Positive for menstrual problem (may last for14 days). Negative for vaginal bleeding and vaginal discharge.    Blood pressure 139/83, pulse 103, temperature 98.4 F (36.9 C), temperature source Oral, height 5\' 5"  (1.651 m), weight 343 lb 11.2 oz (155.901 kg), last menstrual period 02/13/2014.  Physical Exam Physical Exam  Constitutional: She is oriented to person, place, and time.  obese  Pulmonary/Chest: She is in respiratory  distress.  Neurological: She is alert and oriented to person, place, and time.  Skin: Skin is warm and dry.  Psychiatric: She has a normal mood and affect. Her behavior is normal.    Data Reviewed Imaging and op notes  Assessment    Fibroids and menorrhagia, failed management with Mirena and attempted ablation     Plan    Offered TVH. The procedure and the risk of anesthesia bleeding infection pain visceral organ damage discussed and questions answered. The procedure will be scheduled.        ARNOLD,JAMES 03/17/2014, 4:27 PM

## 2014-03-17 NOTE — Patient Instructions (Signed)
Hysterectomy Information  A hysterectomy is a surgery in which your uterus is removed. This surgery may be done to treat various medical problems. After the surgery, you will no longer have menstrual periods. The surgery will also make you unable to become pregnant (sterile). The fallopian tubes and ovaries can be removed (bilateral salpingo-oophorectomy) during this surgery as well.  REASONS FOR A HYSTERECTOMY  Persistent, abnormal bleeding.  Lasting (chronic) pelvic pain or infection.  The lining of the uterus (endometrium) starts growing outside the uterus (endometriosis).  The endometrium starts growing in the muscle of the uterus (adenomyosis).  The uterus falls down into the vagina (pelvic organ prolapse).  Noncancerous growths in the uterus (uterine fibroids) that cause symptoms.  Precancerous cells.  Cervical cancer or uterine cancer. TYPES OF HYSTERECTOMIES  Supracervical hysterectomy--In this type, the top part of the uterus is removed, but not the cervix.  Total hysterectomy--The uterus and cervix are removed.  Radical hysterectomy--The uterus, the cervix, and the fibrous tissue that holds the uterus in place in the pelvis (parametrium) are removed. WAYS A HYSTERECTOMY CAN BE PERFORMED  Abdominal hysterectomy--A large surgical cut (incision) is made in the abdomen. The uterus is removed through this incision.  Vaginal hysterectomy--An incision is made in the vagina. The uterus is removed through this incision. There are no abdominal incisions.  Conventional laparoscopic hysterectomy--Three or four small incisions are made in the abdomen. A thin, lighted tube with a camera (laparoscope) is inserted into one of the incisions. Other tools are put through the other incisions. The uterus is cut into small pieces. The small pieces are removed through the incisions, or they are removed through the vagina.  Laparoscopically assisted vaginal hysterectomy (LAVH)--Three or four  small incisions are made in the abdomen. Part of the surgery is performed laparoscopically and part vaginally. The uterus is removed through the vagina.  Robot-assisted laparoscopic hysterectomy--A laparoscope and other tools are inserted into 3 or 4 small incisions in the abdomen. A computer-controlled device is used to give the surgeon a 3D image and to help control the surgical instruments. This allows for more precise movements of surgical instruments. The uterus is cut into small pieces and removed through the incisions or removed through the vagina. RISKS AND COMPLICATIONS  Possible complications associated with this procedure include:  Bleeding and risk of blood transfusion. Tell your health care provider if you do not want to receive any blood products.  Blood clots in the legs or lung.  Infection.  Injury to surrounding organs.  Problems or side effects related to anesthesia.  Conversion to an abdominal hysterectomy from one of the other techniques. WHAT TO EXPECT AFTER A HYSTERECTOMY  You will be given pain medicine.  You will need to have someone with you for the first 3-5 days after you go home.  You will need to follow up with your surgeon in 2-4 weeks after surgery to evaluate your progress.  You may have early menopause symptoms such as hot flashes, night sweats, and insomnia.  If you had a hysterectomy for a problem that was not cancer or not a condition that could lead to cancer, then you no longer need Pap tests. However, even if you no longer need a Pap test, a regular exam is a good idea to make sure no other problems are starting. Document Released: 12/04/2000 Document Revised: 03/31/2013 Document Reviewed: 02/15/2013 [facility] Patient Information 2015 [place], [place]. This information is not intended to replace advice given to you by your health care  provider. Make sure you discuss any questions you have with your health care provider.

## 2014-03-30 ENCOUNTER — Encounter (HOSPITAL_COMMUNITY): Payer: Self-pay | Admitting: Pharmacist

## 2014-04-05 ENCOUNTER — Other Ambulatory Visit: Payer: Self-pay | Admitting: Obstetrics & Gynecology

## 2014-04-05 NOTE — Patient Instructions (Addendum)
   Your procedure is scheduled on:  Tuesday, Oct 20  Enter through the [employer] [facility] at:  Foster up the phone at the desk and dial [phone] and inform us of your arrival.  Please call this number if you have any problems the morning of surgery: [phone]  Remember: Do not eat food after midnight: Monday Do not drink clear liquids after: 9 AM Tuesday, day of surgery Take these medicines the morning of surgery with a SIP OF WATER: Qvar Inhaler, lisinopril, Brintellix.  Bring albuterol inhaler with you on day of surgery.  Do not wear jewelry, make-up, or FINGER nail polish No metal in your hair or on your body. Do not wear lotions, powders, perfumes.  You may wear deodorant.  Do not bring valuables to the hospital. Contacts, dentures or bridgework may not be worn into surgery.  Leave suitcase in the car. After Surgery it may be brought to your room. For patients being admitted to the hospital, checkout time is 11:00am the day of discharge.  Home with husband Harral cell [phone].

## 2014-04-07 ENCOUNTER — Encounter (HOSPITAL_COMMUNITY): Payer: Self-pay

## 2014-04-07 ENCOUNTER — Encounter (HOSPITAL_COMMUNITY)
Admission: RE | Admit: 2014-04-07 | Discharge: 2014-04-07 | Disposition: A | Discharge status: Home / Self Care | Payer: No Typology Code available for payment source | Source: Ambulatory Visit | Attending: Obstetrics & Gynecology | Admitting: Obstetrics & Gynecology

## 2014-04-07 DIAGNOSIS — Z01812 Encounter for preprocedural laboratory examination: Secondary | ICD-10-CM | POA: Insufficient documentation

## 2014-04-07 LAB — BASIC METABOLIC PANEL
ANION GAP: 12 (ref 5–15)
BUN: 14 mg/dL (ref 6–23)
CO2: 24 meq/L (ref 19–32)
CREATININE: 0.93 mg/dL (ref 0.50–1.10)
Calcium: 9.4 mg/dL (ref 8.4–10.5)
Chloride: 105 mEq/L (ref 96–112)
GFR calc Af Amer: 85 mL/min — ABNORMAL LOW (ref >90)
GFR calc non Af Amer: 74 mL/min — ABNORMAL LOW (ref >90)
Glucose, Bld: 97 mg/dL (ref 70–99)
Potassium: 4.4 mEq/L (ref 3.7–5.3)
Sodium: 141 mEq/L (ref 137–147)

## 2014-04-07 LAB — CBC
HEMATOCRIT: 38.7 % (ref 36.0–46.0)
Hemoglobin: 13.1 g/dL (ref 12.0–15.0)
MCH: 20.9 pg — ABNORMAL LOW (ref 26.0–34.0)
MCHC: 33.9 g/dL (ref 30.0–36.0)
MCV: 61.8 fL — AB (ref 78.0–100.0)
Platelets: 420 10*3/uL — ABNORMAL HIGH (ref 150–400)
RBC: 6.26 MIL/uL — AB (ref 3.87–5.11)
RDW: 20.4 % — ABNORMAL HIGH (ref 11.5–15.5)
WBC: 7.9 10*3/uL (ref 4.0–10.5)

## 2014-04-09 ENCOUNTER — Telehealth: Payer: Self-pay | Admitting: Oncology

## 2014-04-11 MED ORDER — DEXTROSE 5 % IV SOLN
3.0000 g | INTRAVENOUS | Status: AC
  Administered 2014-04-12: 3 g via INTRAVENOUS
  Filled 2014-04-11: qty 3000

## 2014-04-12 ENCOUNTER — Encounter (HOSPITAL_COMMUNITY): Payer: Self-pay | Admitting: Certified Registered Nurse Anesthetist

## 2014-04-12 ENCOUNTER — Encounter (HOSPITAL_COMMUNITY): Payer: Self-pay | Admitting: Anesthesiology

## 2014-04-12 ENCOUNTER — Observation Stay (HOSPITAL_COMMUNITY)
Admission: RE | Admit: 2014-04-12 | Discharge: 2014-04-13 | Disposition: A | Discharge status: Home / Self Care | Payer: Self-pay | Source: Ambulatory Visit | Attending: Obstetrics & Gynecology | Admitting: Obstetrics & Gynecology

## 2014-04-12 ENCOUNTER — Encounter (HOSPITAL_COMMUNITY)
Admission: RE | Disposition: A | Discharge status: Home / Self Care | Payer: Self-pay | Source: Ambulatory Visit | Attending: Obstetrics & Gynecology

## 2014-04-12 ENCOUNTER — Ambulatory Visit (HOSPITAL_COMMUNITY): Payer: No Typology Code available for payment source | Admitting: Certified Registered Nurse Anesthetist

## 2014-04-12 DIAGNOSIS — F329 Major depressive disorder, single episode, unspecified: Secondary | ICD-10-CM | POA: Insufficient documentation

## 2014-04-12 DIAGNOSIS — Z9889 Other specified postprocedural states: Secondary | ICD-10-CM

## 2014-04-12 DIAGNOSIS — M179 Osteoarthritis of knee, unspecified: Secondary | ICD-10-CM | POA: Insufficient documentation

## 2014-04-12 DIAGNOSIS — M19041 Primary osteoarthritis, right hand: Secondary | ICD-10-CM | POA: Insufficient documentation

## 2014-04-12 DIAGNOSIS — M19042 Primary osteoarthritis, left hand: Secondary | ICD-10-CM | POA: Insufficient documentation

## 2014-04-12 DIAGNOSIS — F419 Anxiety disorder, unspecified: Secondary | ICD-10-CM | POA: Insufficient documentation

## 2014-04-12 DIAGNOSIS — Z79899 Other long term (current) drug therapy: Secondary | ICD-10-CM | POA: Insufficient documentation

## 2014-04-12 DIAGNOSIS — N921 Excessive and frequent menstruation with irregular cycle: Secondary | ICD-10-CM | POA: Insufficient documentation

## 2014-04-12 DIAGNOSIS — K219 Gastro-esophageal reflux disease without esophagitis: Secondary | ICD-10-CM | POA: Insufficient documentation

## 2014-04-12 DIAGNOSIS — J45909 Unspecified asthma, uncomplicated: Secondary | ICD-10-CM | POA: Insufficient documentation

## 2014-04-12 DIAGNOSIS — D259 Leiomyoma of uterus, unspecified: Principal | ICD-10-CM | POA: Insufficient documentation

## 2014-04-12 DIAGNOSIS — D649 Anemia, unspecified: Secondary | ICD-10-CM | POA: Insufficient documentation

## 2014-04-12 DIAGNOSIS — F17200 Nicotine dependence, unspecified, uncomplicated: Secondary | ICD-10-CM | POA: Insufficient documentation

## 2014-04-12 HISTORY — PX: BILATERAL SALPINGECTOMY: SHX5743

## 2014-04-12 HISTORY — PX: VAGINAL HYSTERECTOMY: SHX2639

## 2014-04-12 LAB — PREGNANCY, URINE: Preg Test, Ur: NEGATIVE

## 2014-04-12 SURGERY — HYSTERECTOMY, VAGINAL
Anesthesia: General | Site: Vagina

## 2014-04-12 MED ORDER — GLYCOPYRROLATE 0.2 MG/ML IJ SOLN
INTRAMUSCULAR | Status: DC | PRN
  Administered 2014-04-12: 0.1 mg via INTRAVENOUS
  Administered 2014-04-12: .6 mg via INTRAVENOUS
  Administered 2014-04-12: 0.1 mg via INTRAVENOUS

## 2014-04-12 MED ORDER — FENTANYL CITRATE 0.05 MG/ML IJ SOLN
INTRAMUSCULAR | Status: DC | PRN
  Administered 2014-04-12: 100 ug via INTRAVENOUS
  Administered 2014-04-12 (×4): 50 ug via INTRAVENOUS
  Administered 2014-04-12 (×2): 100 ug via INTRAVENOUS

## 2014-04-12 MED ORDER — ONDANSETRON HCL 4 MG PO TABS: 4.0000 mg | ORAL_TABLET | Freq: Four times a day (QID) | ORAL | Status: DC | PRN

## 2014-04-12 MED ORDER — LIDOCAINE HCL (CARDIAC) 20 MG/ML IV SOLN
INTRAVENOUS | Status: AC
  Filled 2014-04-12: qty 5

## 2014-04-12 MED ORDER — HYDROMORPHONE HCL 1 MG/ML IJ SOLN
INTRAMUSCULAR | Status: AC
  Administered 2014-04-12: 0.5 mg via INTRAVENOUS
  Filled 2014-04-12: qty 1

## 2014-04-12 MED ORDER — ACETAMINOPHEN 160 MG/5ML PO SOLN
975.0000 mg | Freq: Once | ORAL | Status: AC
  Administered 2014-04-12: 975 mg via ORAL

## 2014-04-12 MED ORDER — ESMOLOL HCL 10 MG/ML IV SOLN
INTRAVENOUS | Status: AC
  Filled 2014-04-12: qty 10

## 2014-04-12 MED ORDER — HYDROMORPHONE 0.3 MG/ML IV SOLN
INTRAVENOUS | Status: DC
  Administered 2014-04-12: 21:00:00 via INTRAVENOUS
  Administered 2014-04-12 – 2014-04-13 (×2): 2.1 mg via INTRAVENOUS
  Administered 2014-04-13: 07:00:00 via INTRAVENOUS
  Administered 2014-04-13: 1.2 mg via INTRAVENOUS
  Administered 2014-04-13: 2.1 mg via INTRAVENOUS
  Filled 2014-04-12 (×2): qty 25

## 2014-04-12 MED ORDER — LIDOCAINE-EPINEPHRINE (PF) 1 %-1:200000 IJ SOLN
INTRAMUSCULAR | Status: DC | PRN
  Administered 2014-04-12: 15 mL

## 2014-04-12 MED ORDER — ALBUMIN HUMAN 5 % IV SOLN
INTRAVENOUS | Status: DC | PRN
  Administered 2014-04-12: 16:00:00 via INTRAVENOUS

## 2014-04-12 MED ORDER — MEPERIDINE HCL 25 MG/ML IJ SOLN: 6.2500 mg | INTRAMUSCULAR | Status: DC | PRN

## 2014-04-12 MED ORDER — MIDAZOLAM HCL 2 MG/2ML IJ SOLN
INTRAMUSCULAR | Status: AC
  Filled 2014-04-12: qty 2

## 2014-04-12 MED ORDER — PROPOFOL 10 MG/ML IV BOLUS
INTRAVENOUS | Status: DC | PRN
  Administered 2014-04-12: 200 mg via INTRAVENOUS
  Administered 2014-04-12 (×2): 100 mg via INTRAVENOUS

## 2014-04-12 MED ORDER — ROCURONIUM BROMIDE 100 MG/10ML IV SOLN
INTRAVENOUS | Status: AC
  Filled 2014-04-12: qty 1

## 2014-04-12 MED ORDER — PROPOFOL 10 MG/ML IV EMUL
INTRAVENOUS | Status: AC
  Filled 2014-04-12: qty 20

## 2014-04-12 MED ORDER — LIDOCAINE-EPINEPHRINE (PF) 1 %-1:200000 IJ SOLN
INTRAMUSCULAR | Status: AC
  Filled 2014-04-12: qty 10

## 2014-04-12 MED ORDER — DEXAMETHASONE SODIUM PHOSPHATE 10 MG/ML IJ SOLN
INTRAMUSCULAR | Status: AC
  Filled 2014-04-12: qty 1

## 2014-04-12 MED ORDER — LACTATED RINGERS IV SOLN
INTRAVENOUS | Status: DC
  Administered 2014-04-12 (×4): via INTRAVENOUS

## 2014-04-12 MED ORDER — KETOROLAC TROMETHAMINE 30 MG/ML IJ SOLN: 30.0000 mg | Freq: Four times a day (QID) | INTRAMUSCULAR | Status: DC

## 2014-04-12 MED ORDER — ONDANSETRON HCL 4 MG/2ML IJ SOLN
4.0000 mg | Freq: Four times a day (QID) | INTRAMUSCULAR | Status: DC | PRN
Start: 2014-04-12 — End: 2014-04-13

## 2014-04-12 MED ORDER — HYDROMORPHONE HCL 1 MG/ML IJ SOLN
INTRAMUSCULAR | Status: AC
  Filled 2014-04-12: qty 1

## 2014-04-12 MED ORDER — LACTATED RINGERS IV SOLN
INTRAVENOUS | Status: DC
  Administered 2014-04-12 – 2014-04-13 (×2): via INTRAVENOUS

## 2014-04-12 MED ORDER — NEOSTIGMINE METHYLSULFATE 10 MG/10ML IV SOLN
INTRAVENOUS | Status: DC | PRN
  Administered 2014-04-12: 4 mg via INTRAVENOUS

## 2014-04-12 MED ORDER — NEOSTIGMINE METHYLSULFATE 10 MG/10ML IV SOLN
INTRAVENOUS | Status: AC
  Filled 2014-04-12: qty 1

## 2014-04-12 MED ORDER — LACTATED RINGERS IV SOLN: INTRAVENOUS | Status: DC

## 2014-04-12 MED ORDER — ESMOLOL HCL 10 MG/ML IV SOLN
INTRAVENOUS | Status: DC | PRN
  Administered 2014-04-12 (×2): 10 mg via INTRAVENOUS

## 2014-04-12 MED ORDER — 0.9 % SODIUM CHLORIDE (POUR BTL) OPTIME
TOPICAL | Status: DC | PRN
  Administered 2014-04-12 (×2): 1000 mL

## 2014-04-12 MED ORDER — ALBUMIN HUMAN 5 % IV SOLN
INTRAVENOUS | Status: AC
  Filled 2014-04-12: qty 250

## 2014-04-12 MED ORDER — ONDANSETRON HCL 4 MG/2ML IJ SOLN
INTRAMUSCULAR | Status: AC
  Filled 2014-04-12: qty 2

## 2014-04-12 MED ORDER — KETOROLAC TROMETHAMINE 30 MG/ML IJ SOLN
30.0000 mg | Freq: Four times a day (QID) | INTRAMUSCULAR | Status: DC
  Filled 2014-04-12: qty 1

## 2014-04-12 MED ORDER — MIDAZOLAM HCL 2 MG/2ML IJ SOLN
INTRAMUSCULAR | Status: DC | PRN
  Administered 2014-04-12: 0.5 mg via INTRAVENOUS
  Administered 2014-04-12: 1.5 mg via INTRAVENOUS

## 2014-04-12 MED ORDER — FENTANYL CITRATE 0.05 MG/ML IJ SOLN
INTRAMUSCULAR | Status: AC
  Filled 2014-04-12: qty 5

## 2014-04-12 MED ORDER — ONDANSETRON HCL 4 MG/2ML IJ SOLN
INTRAMUSCULAR | Status: DC | PRN
Start: 2014-04-12 — End: 2014-04-12
  Administered 2014-04-12 (×2): 2 mg via INTRAVENOUS

## 2014-04-12 MED ORDER — BUPIVACAINE-EPINEPHRINE (PF) 0.25% -1:200000 IJ SOLN
INTRAMUSCULAR | Status: AC
  Filled 2014-04-12: qty 30

## 2014-04-12 MED ORDER — ACETAMINOPHEN 160 MG/5ML PO SOLN
ORAL | Status: AC
  Filled 2014-04-12: qty 40.6

## 2014-04-12 MED ORDER — SCOPOLAMINE 1 MG/3DAYS TD PT72
MEDICATED_PATCH | TRANSDERMAL | Status: AC
  Administered 2014-04-12: 1.5 mg via TRANSDERMAL
  Filled 2014-04-12: qty 1

## 2014-04-12 MED ORDER — LIDOCAINE HCL (CARDIAC) 20 MG/ML IV SOLN
INTRAVENOUS | Status: DC | PRN
  Administered 2014-04-12: 100 mg via INTRAVENOUS

## 2014-04-12 MED ORDER — SODIUM CHLORIDE 0.9 % IJ SOLN
9.0000 mL | INTRAMUSCULAR | Status: DC | PRN
Start: 2014-04-12 — End: 2014-04-13

## 2014-04-12 MED ORDER — DIPHENHYDRAMINE HCL 12.5 MG/5ML PO ELIX
12.5000 mg | ORAL_SOLUTION | Freq: Four times a day (QID) | ORAL | Status: DC | PRN
  Administered 2014-04-13: 12.5 mg via ORAL
  Filled 2014-04-12: qty 5

## 2014-04-12 MED ORDER — DIPHENHYDRAMINE HCL 50 MG/ML IJ SOLN: 12.5000 mg | Freq: Four times a day (QID) | INTRAMUSCULAR | Status: DC | PRN

## 2014-04-12 MED ORDER — HYDROMORPHONE HCL 1 MG/ML IJ SOLN
INTRAMUSCULAR | Status: DC | PRN
  Administered 2014-04-12: 2 mg via INTRAVENOUS

## 2014-04-12 MED ORDER — NALOXONE HCL 0.4 MG/ML IJ SOLN: 0.4000 mg | INTRAMUSCULAR | Status: DC | PRN

## 2014-04-12 MED ORDER — ACETAMINOPHEN 10 MG/ML IV SOLN
1000.0000 mg | Freq: Once | INTRAVENOUS | Status: DC
  Filled 2014-04-12: qty 100

## 2014-04-12 MED ORDER — PNEUMOCOCCAL VAC POLYVALENT 25 MCG/0.5ML IJ INJ
0.5000 mL | INJECTION | INTRAMUSCULAR | Status: AC
  Administered 2014-04-13: 0.5 mL via INTRAMUSCULAR
  Filled 2014-04-12: qty 0.5

## 2014-04-12 MED ORDER — ROCURONIUM BROMIDE 100 MG/10ML IV SOLN
INTRAVENOUS | Status: DC | PRN
  Administered 2014-04-12: 80 mg via INTRAVENOUS
  Administered 2014-04-12 (×7): 10 mg via INTRAVENOUS

## 2014-04-12 MED ORDER — GLYCOPYRROLATE 0.2 MG/ML IJ SOLN
INTRAMUSCULAR | Status: AC
  Filled 2014-04-12: qty 4

## 2014-04-12 MED ORDER — SCOPOLAMINE 1 MG/3DAYS TD PT72
1.0000 | MEDICATED_PATCH | Freq: Once | TRANSDERMAL | Status: AC
  Administered 2014-04-12: 1 via TRANSDERMAL
  Administered 2014-04-12: 1.5 mg via TRANSDERMAL

## 2014-04-12 MED ORDER — OXYCODONE-ACETAMINOPHEN 5-325 MG PO TABS
1.0000 | ORAL_TABLET | ORAL | Status: DC | PRN
  Administered 2014-04-13: 2 via ORAL
  Filled 2014-04-12: qty 2

## 2014-04-12 MED ORDER — BUPIVACAINE-EPINEPHRINE (PF) 0.25% -1:200000 IJ SOLN
INTRAMUSCULAR | Status: DC | PRN
  Administered 2014-04-12: 10 mL

## 2014-04-12 MED ORDER — HYDROMORPHONE HCL 1 MG/ML IJ SOLN
0.2500 mg | INTRAMUSCULAR | Status: DC | PRN
  Administered 2014-04-12 (×4): 0.5 mg via INTRAVENOUS

## 2014-04-12 MED ORDER — METOCLOPRAMIDE HCL 5 MG/ML IJ SOLN: 10.0000 mg | Freq: Once | INTRAMUSCULAR | Status: DC | PRN

## 2014-04-12 SURGICAL SUPPLY — 32 items
ADH SKN CLS APL DERMABOND .7 (GAUZE/BANDAGES/DRESSINGS) ×2
CLOTH BEACON ORANGE TIMEOUT ST (SAFETY) ×4 IMPLANT
DERMABOND ADVANCED (GAUZE/BANDAGES/DRESSINGS) ×2
DERMABOND ADVANCED .7 DNX12 (GAUZE/BANDAGES/DRESSINGS) IMPLANT
DRSG COVADERM PLUS 2X2 (GAUZE/BANDAGES/DRESSINGS) ×2 IMPLANT
DRSG OPSITE POSTOP 3X4 (GAUZE/BANDAGES/DRESSINGS) ×2 IMPLANT
DRSG TELFA 3X8 NADH (GAUZE/BANDAGES/DRESSINGS) ×4 IMPLANT
ELECT LIGASURE LONG (ELECTRODE) IMPLANT
ELECT LIGASURE SHORT 9 REUSE (ELECTRODE) IMPLANT
GLOVE BIO SURGEON STRL SZ 6.5 (GLOVE) ×7 IMPLANT
GLOVE BIO SURGEONS STRL SZ 6.5 (GLOVE) ×5
GLOVE BIOGEL PI IND STRL 7.0 (GLOVE) ×2 IMPLANT
GLOVE BIOGEL PI INDICATOR 7.0 (GLOVE) ×10
GLOVE ECLIPSE 6.0 STRL STRAW (GLOVE) ×4 IMPLANT
GOWN STRL REUS W/TWL LRG LVL3 (GOWN DISPOSABLE) ×16 IMPLANT
NS IRRIG 1000ML POUR BTL (IV SOLUTION) ×4 IMPLANT
PACK LAPAROSCOPY BASIN (CUSTOM PROCEDURE TRAY) ×2 IMPLANT
PACK VAGINAL WOMENS (CUSTOM PROCEDURE TRAY) ×4 IMPLANT
PAD DRESSING TELFA 3X8 NADH (GAUZE/BANDAGES/DRESSINGS) ×2 IMPLANT
PAD OB MATERNITY 4.3X12.25 (PERSONAL CARE ITEMS) ×4 IMPLANT
SHEARS FOC LG CVD HARMONIC 17C (MISCELLANEOUS) ×2 IMPLANT
SUT VIC AB 0 CT1 18XCR BRD8 (SUTURE) ×4 IMPLANT
SUT VIC AB 0 CT1 36 (SUTURE) ×4 IMPLANT
SUT VIC AB 0 CT1 8-18 (SUTURE) ×8
SUT VIC AB 2-0 CT1 27 (SUTURE) ×8
SUT VIC AB 2-0 CT1 TAPERPNT 27 (SUTURE) ×4 IMPLANT
SUT VICRYL 0 TIES 12 18 (SUTURE) ×4 IMPLANT
TOWEL OR 17X24 6PK STRL BLUE (TOWEL DISPOSABLE) ×8 IMPLANT
TRAY FOLEY CATH 14FR (SET/KITS/TRAYS/PACK) ×4 IMPLANT
TUBING SUCTION BULK 100 FT (MISCELLANEOUS) ×2 IMPLANT
WATER STERILE IRR 1000ML POUR (IV SOLUTION) ×4 IMPLANT
YANKAUER SUCT BULB TIP NO VENT (SUCTIONS) ×2 IMPLANT

## 2014-04-12 NOTE — Transfer of Care (Signed)
Immediate Anesthesia Transfer of Care Note  Patient: Cynthia Becker  Procedure(s) Performed: Procedure(s): LAPAROSCOPIC ASSISTED VAGINAL HYSTERECTOMY (N/A) BILATERAL SALPINGECTOMY (Bilateral)  Patient Location: PACU  Anesthesia Type:General  Level of Consciousness: sedated  Airway & Oxygen Therapy: Patient Spontanous Breathing and Patient connected to nasal cannula oxygen  Post-op Assessment: Report given to PACU RN and Post -op Vital signs reviewed and stable  Post vital signs: stable  Complications: No apparent anesthesia complications

## 2014-04-12 NOTE — Progress Notes (Addendum)
Patient with pain level of 9 of 10 when awake 2mg  Dilaudid given in PACU. Dr Jeri Modena called and came to bedside to evaluate patient was sleeping.  Order given for PO tylenol 975.  Tylenol given and transfer to floor patient to have PCA on the floor.  Pain 5 out of 10 at time of transfer.  Floor called to prepare patient PCA, order released.

## 2014-04-12 NOTE — Anesthesia Preprocedure Evaluation (Signed)
Anesthesia Evaluation  Patient identified by MRN, date of birth, ID band Patient awake    Reviewed: Allergy & Precautions, H&P , NPO status , Patient's Chart, lab work & pertinent test results, reviewed documented beta blocker date and time   Airway Mallampati: III TM Distance: >3 FB Neck ROM: Full    Dental  (+) Teeth Intact   Pulmonary asthma , Current Smoker,  breath sounds clear to auscultation  Pulmonary exam normal       Cardiovascular hypertension, Pt. on medications Rhythm:Regular Rate:Normal     Neuro/Psych  Headaches, PSYCHIATRIC DISORDERS Anxiety Depression  Neuromuscular disease    GI/Hepatic negative GI ROS, Neg liver ROS,   Endo/Other  Morbid obesity  Renal/GU negative Renal ROS  negative genitourinary   Musculoskeletal  (+) Arthritis -, Chronic low back pain   Abdominal Normal abdominal exam  (+) + obese,   Peds  Hematology  (+) anemia ,   Anesthesia Other Findings   Reproductive/Obstetrics Menorrhagia Fibroid uterus                           Anesthesia Physical Anesthesia Plan  ASA: III  Anesthesia Plan: General   Post-op Pain Management:    Induction: Intravenous  Airway Management Planned: Oral ETT  Additional Equipment:   Intra-op Plan:   Post-operative Plan: Extubation in OR  Informed Consent: I have reviewed the patients History and Physical, chart, labs and discussed the procedure including the risks, benefits and alternatives for the proposed anesthesia with the patient or authorized representative who has indicated his/her understanding and acceptance.   Dental advisory given  Plan Discussed with: Anesthesiologist, CRNA and Surgeon  Anesthesia Plan Comments:         Anesthesia Quick Evaluation

## 2014-04-12 NOTE — Anesthesia Postprocedure Evaluation (Signed)
Anesthesia Post Note  Patient: Cynthia Becker  Procedure(s) Performed: Procedure(s) (LRB): LAPAROSCOPIC ASSISTED VAGINAL HYSTERECTOMY (N/A) BILATERAL SALPINGECTOMY (Bilateral)  Anesthesia type: General  Patient location: PACU  Post pain: Pain level controlled  Post assessment: Post-op Vital signs reviewed  Last Vitals:  Filed Vitals:   04/12/14 1835  Pulse:   Temp: 37.3 C  Resp:     Post vital signs: Reviewed  Level of consciousness: sedated  Complications: No apparent anesthesia complications

## 2014-04-12 NOTE — H&P (Signed)
HPI  Cynthia Becker is a 43 y.o. female. [id number]  Patient's last menstrual period was 02/13/2014.Still spotting. .Had hysteroscopy And attemped Novasure. IUD had been lost prior to surgery. H/O menorrhagia and anemia. Wants definitive therapy. Scheduled today for TVH BS HPI  Past Medical History   Diagnosis  Date   .  High blood pressure    .  GERD (gastroesophageal reflux disease)    .  Depression    .  Anxiety    .  Asthma      seasonal related/ albuterol used when uri   .  Headache(784.0)    .  H/O dizziness    .  Arthritis      knee, hands   .  Anemia      iv iron treatment dec 2014/ see oncology for this last in dec 2014    Past Surgical History   Procedure  Laterality  Date   .  Gallbladder surgery   04/04/2010   .  Tubal ligation     .  Cholecystectomy   10/11     lap choli   .  Carpal tunnel release   08/30/2011     Procedure: CARPAL TUNNEL RELEASE; Surgeon: Wynonia Sours, MD; Location: [facility]; Service: Orthopedics; Laterality: Right;   .  Intrauterine device insertion       07/19/2013   .  Tonsillectomy and adenoidectomy  Bilateral  09/08/2013     Procedure: TONSILLECTOMY ; Surgeon: Ruby Cola, MD; Location: [place]; Service: ENT; Laterality: Bilateral;   .  Septoplasty  N/A  09/08/2013     Procedure: SEPTOPLASTY; Surgeon: Ruby Cola, MD; Location: [facility]; Service: ENT; Laterality: N/A;   .  Hysteroscopy with novasure  N/A  02/08/2014     Procedure: ATTEMPTED NOVASURE ABLATION; Surgeon: Woodroe Mode, MD; Location: [facility]; Service: Gynecology; Laterality: N/A;   .  Hysteroscopy  N/A  02/08/2014     Procedure: HYSTEROSCOPY; Surgeon: Woodroe Mode, MD; Location: [facility]; Service: Gynecology; Laterality: N/A;    Family History   Problem  Relation  Age of Onset   .  Heart disease     .  Diabetes     .  Hypertension     .  Alcohol abuse     .  Diabetes  Mother    .  Hypertension  Father    Social History  History   Substance Use Topics   .   Smoking status:  Current Some Day Smoker -- 0.25 packs/day     Types:  Cigarettes   .  Smokeless tobacco:  Never Used   .  Alcohol Use:  0.0 oz/week      Comment: rare   No Known Allergies  Current Outpatient Prescriptions   Medication  Sig  Dispense  Refill   .  albuterol (PROVENTIL HFA;VENTOLIN HFA) 108 (90 BASE) MCG/ACT inhaler  Inhale 2 puffs into the lungs every 6 (six) hours as needed for wheezing or shortness of breath.     .  beclomethasone (QVAR) 40 MCG/ACT inhaler  Inhale 2 puffs into the lungs 2 (two) times daily.     .  ferrous gluconate (FERGON) 324 MG tablet  Take 324 mg by mouth daily with breakfast.     .  HYDROcodone-acetaminophen (NORCO/VICODIN) 5-325 MG per tablet  Take 1 tablet by mouth every 6 (six) hours as needed.  15 tablet  0   .  lactulose (CHRONULAC) 10 GM/15ML solution  Take 10  g by mouth 2 (two) times daily as needed for mild constipation.     Marland Kitchen  lisinopril (PRINIVIL,ZESTRIL) 20 MG tablet  Take 20 mg by mouth daily.     Marland Kitchen  loratadine-pseudoephedrine (CLARITIN-D 24-HOUR) 10-240 MG per 24 hr tablet  Take 1 tablet by mouth daily.     .  megestrol (MEGACE) 40 MG tablet  TAKE ONE TABLET BY MOUTH ONCE DAILY  30 tablet  0   .  mirtazapine (REMERON) 15 MG tablet  Take 15 mg by mouth at bedtime.     .  Vortioxetine HBr (BRINTELLIX) 20 MG TABS  Take 20 mg by mouth daily.      No current facility-administered medications for this visit.   Review of Systems  Review of Systems  Constitutional: Negative.  Genitourinary: Positive for menstrual problem (may last for14 days). Negative for vaginal bleeding and vaginal discharge.  Filed Vitals:   04/12/14 1200  Pulse: 100  Temp: 98.4 F (36.9 C)  TempSrc: Oral  Resp: 20  SpO2: 99%    Physical Exam  Physical Exam  Constitutional: She is oriented to person, place, and time.  obese  Pulmonary/Chest: Nl effort, no distress Neurological: She is alert and oriented to person, place, and time.  Skin: Skin is warm and dry.   Psychiatric: She has a normal mood and affect. Her behavior is normal.  Data Reviewed  Imaging and op notes  Assessment  Fibroids and menorrhagia, failed management with Mirena and attempted ablation  Plan  Offered TVH and BS. The procedure and the risk of anesthesia bleeding, transfusion, infection pain visceral organ damage discussed and questions answered. Scheduled for today. Albuterol prior to surgery  Woodroe Mode, MD 04/12/2014

## 2014-04-12 NOTE — Addendum Note (Signed)
Addendum created 04/12/14 1922 by Assunta Gambles, MD   Modules edited: Orders

## 2014-04-12 NOTE — Op Note (Signed)
Hysterectomy Procedure Note  Indications: Fibroid uterus and irregular menstrual bleeding  Pre-operative Diagnosis: Fibroid uterus  Post-operative Diagnosis: Same  Operation: Laparoscopic-assisted vaginal hysterectomy and bilateral salpingectomy  Surgeon: ARNOLD,JAMES   Assistants: Dr. Mora Bellman and Dr. Verita Schneiders  Anesthesia: General endotracheal anesthesia  ASA Class: 3  Procedure Details  The patient was seen in the Holding Room. The risks, benefits, complications, treatment options, and expected outcomes were discussed with the patient.  The patient concurred with the proposed plan, giving informed consent.  The site of surgery properly noted/marked. The patient was taken to Operating Room # 4, identified as Cynthia Becker and the procedure verified as transvaginal hysterectomy and bilateral salpingectomy. A Time Out was held and the above information confirmed.  After induction of anesthesia, the patient was draped and prepped in the usual sterile manner. Pt was placed in lithotomy position after anesthesia and draped and prepped in the usual sterile manner. Foley catheter was placed.  Exam under anesthesia revealed a 10 week size uterus. Exam is limited because the patient's obesity. Weighted speculum was placed and the cervix was visualized. 1% lidocaine with 1 2000 epinephrine was infiltrated around the cervix. #10 blade was used to incise around the cervix and the vaginal mucosa was pushed back. The posterior cul-de-sac was entered with Metzenbaum scissors and the incision was extended to allow placement of the Bonnano retractor. The cervix was grasped with double-tooth tenacula. There was little descensus with traction. Heaney clamps were placed on the uterosacral ligaments and these were cut and suture ligated with 0 Vicryl. The cardinal ligaments were likewise cut and suture ligated. Attempts to confirm entry into the anterior peritoneum were unsuccessful. Proceeded with  clamping the uterine vessels and suture ligating these and good hemostasis was seen. At this point the uterus has not descended very much at all and once the uterine vessels had been ligated were unable to reach the adnexa. Suspected that the uterus was too large to descend any further. We elected to proceed with a laparoscopy to assist removal of the uterus. The vagina was packed with moist towel were 2 maintain pneumoperitoneum during laparoscopy. The abdomen was sterilely prepped and draped. Lidocaine was infiltrated at the umbilicus and 1/2 cm infraumbilical incision was made and Veress needle was placed and CO2 was insufflated. This 10 mm trocar was placed under direct visualization with the laparoscope. Peritoneal cavity was inserted. Patient was placed in Trendelenburg position. Globular uterus that appeared to be about 12 cm across was visualized. The tubes had previously been ligated for sterilization. Ovaries appeared normal. Second punctures were placed the 5 mm trochars in the right lower quadrant left lower quadrant. The Milex scalpel was used to incise the uses salpinx in order to accomplish a salpingectomy on the left and on the right. The right tube was excised and removed through the port on the left side and sent to pathology. The round ligaments were cut and bladder was taken down. The utero-ovarian ligaments and the remaining adnexal pedicles were again down using the harmonic scalpel. The uterus was freed up. Then came back to the vaginal approach and again is difficult to remove the uterus due to its size. The uterus was morcellated from below and many small fibroids were removed before the uterine fundus was removed. Vaginal cuff and peritoneum were closed with a running locking suture with 0 Vicryl good hemostasis was seen. Laparoscopic examination confirmed good hemostasis in the pelvis. The pelvis had been irrigated prior  to removal of  the uterus. CO2 was released instruments were removed.  0 Vicryl suture was placed at the fascia at the umbilicus. Skin incisions were closed with Dermabond. Estimated blood loss was 400 mL. Instrument, sponge, and needle counts were correct prior to abdominal closure and at the conclusion of the case.   Findings: Large fibroid uterus fallopian tubes previously sterilized and normal ovaries  Estimated Blood Loss:  400 ml         Drains:  Foley catheter          Total IV Fluids:  2000 mL         Specimens:  uterus and cervix fibroids and fallopian tubes         Implants:  none          ComplicationsNone; patient tolerated the procedure well.."}         Disposition: PACU - hemodynamically stable.         Condition: stable  Attending Attestation: I was present and scrubbed for the entire procedure.   Woodroe Mode, MD 04/12/2014 6:39 PM

## 2014-04-13 ENCOUNTER — Encounter (HOSPITAL_COMMUNITY): Payer: Self-pay | Admitting: Obstetrics & Gynecology

## 2014-04-13 LAB — CBC
HEMATOCRIT: 35 % — AB (ref 36.0–46.0)
Hemoglobin: 11.7 g/dL — ABNORMAL LOW (ref 12.0–15.0)
MCH: 20.9 pg — ABNORMAL LOW (ref 26.0–34.0)
MCHC: 33.4 g/dL (ref 30.0–36.0)
MCV: 62.4 fL — AB (ref 78.0–100.0)
PLATELETS: 368 10*3/uL (ref 150–400)
RBC: 5.61 MIL/uL — ABNORMAL HIGH (ref 3.87–5.11)
RDW: 20.3 % — AB (ref 11.5–15.5)
WBC: 14.1 10*3/uL — ABNORMAL HIGH (ref 4.0–10.5)

## 2014-04-13 MED ORDER — OXYCODONE-ACETAMINOPHEN 5-325 MG PO TABS: 1.0000 | ORAL_TABLET | ORAL | Status: DC | PRN

## 2014-04-13 NOTE — Progress Notes (Signed)
Discharge teaching complete. Pt understood all instructions. Pt ambulated out of the hospital and discharged home to family.

## 2014-04-13 NOTE — Anesthesia Postprocedure Evaluation (Signed)
  Anesthesia Post-op Note  Patient: Cynthia Becker  Procedure(s) Performed: Procedure(s): LAPAROSCOPIC ASSISTED VAGINAL HYSTERECTOMY (N/A) BILATERAL SALPINGECTOMY (Bilateral)  Patient Location: PACU and Women's Unit  Anesthesia Type:General  Level of Consciousness: awake, alert  and oriented  Airway and Oxygen Therapy: Patient Spontanous Breathing  Post-op Pain: mild  Post-op Assessment: Patient's Cardiovascular Status Stable, Respiratory Function Stable, No signs of Nausea or vomiting, Adequate PO intake and Pain level controlled  Post-op Vital Signs: Reviewed and stable  Last Vitals:  Filed Vitals:   04/13/14 0843  BP:   Pulse:   Temp:   Resp: 20    Complications: No apparent anesthesia complications

## 2014-04-13 NOTE — Discharge Instructions (Signed)

## 2014-04-13 NOTE — Discharge Summary (Signed)
Physician Discharge Summary  Patient ID: Cynthia Becker MRN: [id number] DOB/AGE: [birth year] 44 y.o.  Admit date: 04/12/2014 Discharge date: 04/13/2014  Admission Diagnoses:menometrorrhagia, fibroid uterus  Discharge Diagnoses: same Active Problems:   Postoperative state   Discharged Condition: good  Hospital Course: 43 y.o. Patient's last menstrual period was 02/13/2014. [id number] H/O fibroids and menometrorrhagia unresponsive to medical therapy and attempted endometrial ablation 01/2014 admitted for planned [facility] BS. The procedure converted to LAVH and the uterus was partially morcellated vaginally. She did well postoperatively  Consults: None  Significant Diagnostic Studies: labs:  CBC    Component Value Date/Time   WBC 14.1* 04/13/2014 0717   WBC 9.1 03/07/2014 1022   RBC 5.61* 04/13/2014 0717   RBC 6.17* 03/07/2014 1022   HGB 11.7* 04/13/2014 0717   HGB 12.4 03/07/2014 1022   HCT 35.0* 04/13/2014 0717   HCT 39.0 03/07/2014 1022   PLT 368 04/13/2014 0717   PLT 413* 03/07/2014 1022   MCV 62.4* 04/13/2014 0717   MCV 63.1* 03/07/2014 1022   MCH 20.9* 04/13/2014 0717   MCH 20.2* 03/07/2014 1022   MCHC 33.4 04/13/2014 0717   MCHC 31.9 03/07/2014 1022   RDW 20.3* 04/13/2014 0717   RDW 22.3* 03/07/2014 1022   LYMPHSABS 2.7 03/07/2014 1022   LYMPHSABS 1.9 09/01/2012 0155   MONOABS 0.5 03/07/2014 1022   MONOABS 0.7 09/01/2012 0155   EOSABS 1.0* 03/07/2014 1022   EOSABS 0.6 09/01/2012 0155   BASOSABS 0.1 03/07/2014 1022   BASOSABS 0.1 09/01/2012 0155      Treatments: surgery: LAVH BS  Discharge Exam: Blood pressure 145/90, pulse 79, temperature 98.7 F (37.1 C), temperature source Oral, resp. rate 22, height 5\' 5"  (1.651 m), weight 352 lb (159.666 kg), last menstrual period 02/13/2014, SpO2 100.00%. General appearance: alert, cooperative and no distress Resp: normal effort GI: soft, non-tender; bowel sounds normal; no masses,  no organomegaly and dressing dry  Disposition:  01-Home or Self Care     Medication List    STOP taking these medications       megestrol 40 MG tablet  Commonly known as:  MEGACE      TAKE these medications       albuterol 108 (90 BASE) MCG/ACT inhaler  Commonly known as:  PROVENTIL HFA;VENTOLIN HFA  Inhale 2 puffs into the lungs every 6 (six) hours as needed for wheezing or shortness of breath.     beclomethasone 40 MCG/ACT inhaler  Commonly known as:  QVAR  Inhale 2 puffs into the lungs 2 (two) times daily.     BRINTELLIX 20 MG Tabs  Generic drug:  Vortioxetine HBr  Take 20 mg by mouth daily.     doxepin 25 MG capsule  Commonly known as:  SINEQUAN  Take 25 mg by mouth at bedtime.     ferrous gluconate 324 MG tablet  Commonly known as:  FERGON  Take 324 mg by mouth daily with breakfast.     ibuprofen 800 MG tablet  Commonly known as:  ADVIL,MOTRIN  Take 800 mg by mouth every 8 (eight) hours as needed for moderate pain.     lactulose 10 GM/15ML solution  Commonly known as:  CHRONULAC  Take 10 g by mouth 2 (two) times daily as needed for mild constipation.     lisinopril 20 MG tablet  Commonly known as:  PRINIVIL,ZESTRIL  Take 20 mg by mouth daily.     loratadine-pseudoephedrine 10-240 MG per 24 hr tablet  Commonly known as:  CLARITIN-D 24-hour  Take 1 tablet by mouth daily.     lurasidone 40 MG Tabs tablet  Commonly known as:  LATUDA  Take 40 mg by mouth daily after supper.     oxyCODONE-acetaminophen 5-325 MG per tablet  Commonly known as:  PERCOCET/ROXICET  Take 1-2 tablets by mouth every 4 (four) hours as needed for severe pain (moderate to severe pain (when tolerating fluids)).           Follow-up Information   Follow up with [facility]. Schedule an appointment as soon as possible for a visit in 4 weeks.   Contact information:   [place] [address] [phone]       Signed: [name] 04/13/2014, 8:40 AM

## 2014-04-13 NOTE — Plan of Care (Addendum)
Problem: Phase I Progression Outcomes Goal: VS, stable, temp < 100.4 degrees F Outcome: Progressing Blood pressure elevated 168/84.

## 2014-04-13 NOTE — Addendum Note (Signed)
Addendum created 04/13/14 7001 by Elenore Paddy, CRNA   Modules edited: Notes Section   Notes Section:  File: [id number]

## 2014-04-25 ENCOUNTER — Encounter (HOSPITAL_COMMUNITY): Payer: Self-pay | Admitting: Obstetrics & Gynecology

## 2014-06-06 ENCOUNTER — Ambulatory Visit (INDEPENDENT_AMBULATORY_CARE_PROVIDER_SITE_OTHER): Payer: No Typology Code available for payment source | Admitting: Obstetrics & Gynecology

## 2014-06-06 ENCOUNTER — Encounter: Payer: Self-pay | Admitting: Obstetrics & Gynecology

## 2014-06-06 VITALS — BP 133/74 | HR 106 | Ht 65.0 in | Wt 362.8 lb

## 2014-06-06 DIAGNOSIS — Z9889 Other specified postprocedural states: Secondary | ICD-10-CM

## 2014-06-06 NOTE — Progress Notes (Signed)
Subjective: 7 weeks postop     Cynthia Becker is a 43 y.o. female who presents to the clinic 7 weeks status post laparoscopic assisted vaginal hysterectomy for abnormal uterine bleeding and fibroids. Eating a regular diet without difficulty. Bowel movements are abnormal with constipation.pain  at umbilicus  The following portions of the patient's history were reviewed and updated as appropriate: allergies, current medications, past family history, past medical history, past social history, past surgical history and problem list.  Review of Systems Pertinent items are noted in HPI.    Objective:    BP 133/74 mmHg  Pulse 106  Ht 5\' 5"  (1.651 m)  Wt 362 lb 12.8 oz (164.565 kg)  BMI 60.37 kg/m2  LMP 02/13/2014 General:  alert, cooperative and no distress  Abdomen: soft, non-tender  Incision:   healing well, no drainage, no erythema, no hernia, no seroma, no swelling, no dehiscence, incision well approximated    vagina nl cuff intact no mass Assessment:    Doing well postoperatively. Operative findings again reviewed. Pathology report discussed.    Plan:    1. Continue any current medications. 2. Wound care discussed. 3. Activity restrictions: none 4. Anticipated return to work: now. 5. Follow up: 1 year, schedule mammogram screening  Woodroe Mode, MD 06/06/2014

## 2014-08-17 ENCOUNTER — Encounter: Payer: Self-pay | Admitting: *Deleted

## 2014-09-11 ENCOUNTER — Other Ambulatory Visit: Payer: Self-pay | Admitting: Oncology

## 2014-09-12 ENCOUNTER — Ambulatory Visit (HOSPITAL_BASED_OUTPATIENT_CLINIC_OR_DEPARTMENT_OTHER): Payer: No Typology Code available for payment source | Admitting: Oncology

## 2014-09-12 ENCOUNTER — Encounter: Payer: Self-pay | Admitting: Oncology

## 2014-09-12 ENCOUNTER — Other Ambulatory Visit (HOSPITAL_BASED_OUTPATIENT_CLINIC_OR_DEPARTMENT_OTHER): Payer: No Typology Code available for payment source

## 2014-09-12 VITALS — BP 153/93 | HR 99 | Temp 97.9°F | Resp 18 | Ht 65.0 in | Wt 377.1 lb

## 2014-09-12 DIAGNOSIS — D500 Iron deficiency anemia secondary to blood loss (chronic): Secondary | ICD-10-CM

## 2014-09-12 DIAGNOSIS — D509 Iron deficiency anemia, unspecified: Secondary | ICD-10-CM

## 2014-09-12 DIAGNOSIS — K5900 Constipation, unspecified: Secondary | ICD-10-CM

## 2014-09-12 DIAGNOSIS — D473 Essential (hemorrhagic) thrombocythemia: Secondary | ICD-10-CM

## 2014-09-12 DIAGNOSIS — Z6841 Body Mass Index (BMI) 40.0 and over, adult: Secondary | ICD-10-CM

## 2014-09-12 DIAGNOSIS — D582 Other hemoglobinopathies: Secondary | ICD-10-CM

## 2014-09-12 DIAGNOSIS — M545 Low back pain: Secondary | ICD-10-CM

## 2014-09-12 LAB — CBC WITH DIFFERENTIAL/PLATELET
BASO%: 1.1 % (ref 0.0–2.0)
Basophils Absolute: 0.1 10*3/uL (ref 0.0–0.1)
EOS ABS: 0.7 10*3/uL — AB (ref 0.0–0.5)
EOS%: 6.7 % (ref 0.0–7.0)
HCT: 42.4 % (ref 34.8–46.6)
HEMOGLOBIN: 13.3 g/dL (ref 11.6–15.9)
LYMPH%: 24.1 % (ref 14.0–49.7)
MCH: 20.2 pg — ABNORMAL LOW (ref 25.1–34.0)
MCHC: 31.3 g/dL — ABNORMAL LOW (ref 31.5–36.0)
MCV: 64.6 fL — AB (ref 79.5–101.0)
MONO#: 0.6 10*3/uL (ref 0.1–0.9)
MONO%: 5.8 % (ref 0.0–14.0)
NEUT%: 62.3 % (ref 38.4–76.8)
NEUTROS ABS: 6.2 10*3/uL (ref 1.5–6.5)
PLATELETS: 351 10*3/uL (ref 145–400)
RBC: 6.57 10*6/uL — AB (ref 3.70–5.45)
RDW: 21.6 % — ABNORMAL HIGH (ref 11.2–14.5)
WBC: 9.9 10*3/uL (ref 3.9–10.3)
lymph#: 2.4 10*3/uL (ref 0.9–3.3)

## 2014-09-12 MED ORDER — LACTULOSE 10 GM/15ML PO SOLN: 20.0000 g | Freq: Two times a day (BID) | ORAL | Status: DC

## 2014-09-12 NOTE — Progress Notes (Signed)
OFFICE PROGRESS NOTE   September 12, 2014   Physicians:Cynthia Becker/ Cynthia Becker, Cynthia Becker (PCP [facility]), Cynthia Becker mental health  INTERVAL HISTORY:  Patient is seen, alone for visit, in follow up of iron deficiency anemia related to heavy vaginal bleeding, treated with IV feraheme in 04-2013 and again 11-2013 in addition to oral ferrous gluconate ongoing. She had hysterectomy BSO 04-12-14, pathology benign.   Patient has had no bleeding any location since the hysterectomy. She has had hot flashes, and increased low back discomfort since surgery, which was a long procedure. 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    HISTORY Patient was seen at [facility] on 01-04-2013 for problems including fatigue and heavy menses. CBC had WBC 6.2, Hgb 8.1, MCV 51.3, plt 752, diff normal except 7% eos, iron 11, %sat 3, and hemoglobin electrophoresis with Hgb A 69, F 0, A2 2.1 and C 28.7. Sickle solubility test negative. 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 She was begun on ferrous sulfate po in August, which she has been taking with meals once daily and is not tolerating well due to constipation and nausea. Repeat CBC on 03-05-13 had WBC 6.8, Hgb 9.2, MCV 56 and plt 731k., automated diff ok except eos 7%.  Patient recalls having been anemic x years, last on oral iron 2 years prior to referral to this office. For past ~ 7-8 months her menstrual periods remain regular but have been much heavier than usual for at least 5 days, and additional bleeding for ~ 3 days. She used at least 18 super tampons during first 2 days of each period. She is not aware of other bleeding. She was noticeably fatigued with even nonstrenuous activity at time of referral to this office..  She has never been transfused.  She received IV feraheme 1020 mg on 04-30-13.  She had endometrial biopsy with benign findings on 04-28-13, and IUD placed 07-19-13, then began Megace by gynecology on 08-26-13 due to ongoing bleeding. She had IV feraheme 1020 mg again on 11-26-13 for recurrent iron deficiency anemia in setting of ongoing vaginal bleeding. Diagnostic hysteroscopy was done 02-08-14, with IUD no longer in place and normal appearance to endometrial cavity. Hysterectomy BSO performed 04-12-14, pathology benign.     Review of systems as above, also: No fever or symptoms of infection. Chronic constipation worse since out of lactulose from PCP. Has cut out sweetened tea as she tries to lose weight, previously drinking a gallon daily. Remainder of 10 point Review of Systems negative.  Objective:  Vital signs in last 24 hours:  BP 153/93 mmHg  Pulse 99  Temp(Src) 97.9 F (36.6 C) (Oral)  Resp 18  Ht 5\' 5"  (1.651 m)  Wt 377 lb 1.6 oz (171.051 kg)  BMI 62.75 kg/m2  LMP 02/13/2014 Weight up 37 lbs since Sept 2015. Alert, oriented and appropriate. Ambulatory without assistance.    HEENT:PERRL, sclerae not icteric. Oral mucosa moist without lesions, posterior pharynx clear.  Neck supple. No JVD.  Lymphatics:no cervical,supraclavicular adenopathy Resp: clear to auscultation bilaterally and normal percussion bilaterally Cardio: regular rate and rhythm. No gallop. GI: soft, nontender, not distended, no mass or organomegaly. Normally active bowel sounds. Laparoscopic surgical incisions not remarkable. Musculoskeletal/ Extremities: without pitting edema, cords, tenderness Neuro: nonfocal PSYCH appropriate mood and affect Skin without rash, ecchymosis, petechiae Breasts: without dominant mass, skin or nipple findings.  Axillae benign.   Lab Results:  Results for orders placed or performed in visit on 09/12/14  CBC with Differential  Result Value Ref Range   WBC 9.9 3.9 - 10.3 10e3/uL   NEUT# 6.2 1.5 - 6.5 10e3/uL   HGB 13.3 11.6 - 15.9 g/dL   HCT 42.4 34.8 -  46.6 %   Platelets 351 145 - 400 10e3/uL   MCV 64.6 (L) 79.5 - 101.0 fL   MCH 20.2 (L) 25.1 - 34.0 pg   MCHC 31.3 (L) 31.5 - 36.0 g/dL   RBC 6.57 (H) 3.70 - 5.45 10e6/uL   RDW 21.6 (H) 11.2 - 14.5 %   lymph# 2.4 0.9 - 3.3 10e3/uL   MONO# 0.6 0.1 - 0.9 10e3/uL   Eosinophils Absolute 0.7 (H) 0.0 - 0.5 10e3/uL   Basophils Absolute 0.1 0.0 - 0.1 10e3/uL   NEUT% 62.3 38.4 - 76.8 %   LYMPH% 24.1 14.0 - 49.7 %   MONO% 5.8 0.0 - 14.0 %   EOS% 6.7 0.0 - 7.0 %   BASO% 1.1 0.0 - 2.0 %   iron studies available after visit improved but not fully into normal range, with serum iron up to 49 from 30 in 02-2014 and 14 in 11-2013, with % sat now 12, up from 3 in 11-2013. Ferritin is now 22, up from 3 a year ago.  PATHOLOGY Cynthia Becker, Cynthia Becker: 04/13/2014 Client: [facility] Accession: [id number] Received: 04/13/2014 Cynthia Becker, MDAL DIAGNOSIS Diagnosis Uterus and bilateral fallopian tubes - CERVIX: UNREMARKABLE. - ENDOMETRIUM: DECIDUALIZATION OF THE STROMA, CONSISTENT WITH HORMONE EFFECT. - MYOMETRIUM: LEIOMYOMATA. - SEROSA: UNREMARKABLE. - BILATERAL FALLOPIAN TUBES: DECIDUALIZED STROMA  Medications: I have reviewed the patient's current medications. Continue oral iron at least several more months  DISCUSSION: I am glad to see her back at any time if she or other MDs feel that I can help, but expect that she will continue to resolve the iron deficiency now that gyn bleeding has stopped with the hysterectomy. PCP to follow.  Assessment/Plan:   1.severe iron deficiency with anemia: heavy gyn blood loss previously, now post hysterectomy 03-2014 with benign path. Anemia and iron stores improving, suggest continuing oral iron at least several more months then can DC if stable. 2.thrombocytosis likely related to severe iron deficiency previously, with platelet count normal today 3.chronic constipation improved with lactulose.  I have refilled lactulose x1, then by PCP from here 4.morbid  obesity, BMI 62.9. 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 Time spent 15 min  Including >50% counseling and coordination of care. I am glad to see her back if needed, but should be fine to follow with PCP now.  Gordy Levan, MD   09/12/2014, 4:38 PM

## 2014-09-13 LAB — IRON AND TIBC CHCC
%SAT: 12 % — ABNORMAL LOW (ref 21–57)
Iron: 49 ug/dL (ref 41–142)
TIBC: 395 ug/dL (ref 236–444)
UIBC: 346 ug/dL (ref 120–384)

## 2014-09-13 LAB — FERRITIN CHCC: FERRITIN: 22 ng/mL (ref 9–269)

## 2014-09-14 DIAGNOSIS — K5900 Constipation, unspecified: Secondary | ICD-10-CM | POA: Insufficient documentation

## 2014-09-14 DIAGNOSIS — D500 Iron deficiency anemia secondary to blood loss (chronic): Secondary | ICD-10-CM | POA: Insufficient documentation

## 2014-09-14 DIAGNOSIS — Z6841 Body Mass Index (BMI) 40.0 and over, adult: Secondary | ICD-10-CM

## 2014-09-14 HISTORY — DX: Constipation, unspecified: K59.00

## 2015-01-31 ENCOUNTER — Encounter (HOSPITAL_COMMUNITY): Payer: Self-pay | Admitting: Emergency Medicine

## 2015-01-31 ENCOUNTER — Emergency Department (HOSPITAL_COMMUNITY)
Admission: EM | Admit: 2015-01-31 | Discharge: 2015-02-01 | Disposition: A | Discharge status: Home / Self Care | Payer: Self-pay | Attending: Emergency Medicine | Admitting: Emergency Medicine

## 2015-01-31 ENCOUNTER — Emergency Department (HOSPITAL_COMMUNITY): Payer: No Typology Code available for payment source

## 2015-01-31 ENCOUNTER — Encounter (HOSPITAL_COMMUNITY): Payer: Self-pay | Admitting: *Deleted

## 2015-01-31 ENCOUNTER — Emergency Department (HOSPITAL_COMMUNITY)
Admission: EM | Admit: 2015-01-31 | Discharge: 2015-01-31 | Disposition: A | Discharge status: Nursing Home (Includes ICF) | Payer: No Typology Code available for payment source | Source: Home / Self Care

## 2015-01-31 DIAGNOSIS — Z862 Personal history of diseases of the blood and blood-forming organs and certain disorders involving the immune mechanism: Secondary | ICD-10-CM | POA: Insufficient documentation

## 2015-01-31 DIAGNOSIS — Z7982 Long term (current) use of aspirin: Secondary | ICD-10-CM | POA: Insufficient documentation

## 2015-01-31 DIAGNOSIS — Z720 Tobacco use: Secondary | ICD-10-CM | POA: Insufficient documentation

## 2015-01-31 DIAGNOSIS — R0602 Shortness of breath: Secondary | ICD-10-CM

## 2015-01-31 DIAGNOSIS — Z79899 Other long term (current) drug therapy: Secondary | ICD-10-CM | POA: Insufficient documentation

## 2015-01-31 DIAGNOSIS — Z7952 Long term (current) use of systemic steroids: Secondary | ICD-10-CM | POA: Insufficient documentation

## 2015-01-31 DIAGNOSIS — R110 Nausea: Secondary | ICD-10-CM

## 2015-01-31 DIAGNOSIS — J069 Acute upper respiratory infection, unspecified: Secondary | ICD-10-CM | POA: Insufficient documentation

## 2015-01-31 DIAGNOSIS — R079 Chest pain, unspecified: Secondary | ICD-10-CM

## 2015-01-31 DIAGNOSIS — F329 Major depressive disorder, single episode, unspecified: Secondary | ICD-10-CM | POA: Insufficient documentation

## 2015-01-31 DIAGNOSIS — F419 Anxiety disorder, unspecified: Secondary | ICD-10-CM | POA: Insufficient documentation

## 2015-01-31 DIAGNOSIS — M1990 Unspecified osteoarthritis, unspecified site: Secondary | ICD-10-CM | POA: Insufficient documentation

## 2015-01-31 DIAGNOSIS — J45909 Unspecified asthma, uncomplicated: Secondary | ICD-10-CM | POA: Insufficient documentation

## 2015-01-31 LAB — CBC
HEMATOCRIT: 43.8 % (ref 36.0–46.0)
HEMOGLOBIN: 15.2 g/dL — AB (ref 12.0–15.0)
MCH: 23.3 pg — ABNORMAL LOW (ref 26.0–34.0)
MCHC: 34.7 g/dL (ref 30.0–36.0)
MCV: 67.2 fL — AB (ref 78.0–100.0)
PLATELETS: 404 10*3/uL — AB (ref 150–400)
RBC: 6.52 MIL/uL — ABNORMAL HIGH (ref 3.87–5.11)
RDW: 19.3 % — AB (ref 11.5–15.5)
WBC: 9.3 10*3/uL (ref 4.0–10.5)

## 2015-01-31 LAB — I-STAT TROPONIN, ED: Troponin i, poc: 0.01 ng/mL (ref 0.00–0.08)

## 2015-01-31 LAB — BASIC METABOLIC PANEL
Anion gap: 10 (ref 5–15)
BUN: 11 mg/dL (ref 6–20)
CHLORIDE: 99 mmol/L — AB (ref 101–111)
CO2: 26 mmol/L (ref 22–32)
CREATININE: 0.83 mg/dL (ref 0.44–1.00)
Calcium: 9.3 mg/dL (ref 8.9–10.3)
GFR calc Af Amer: >60 mL/min (ref >60)
Glucose, Bld: 110 mg/dL — ABNORMAL HIGH (ref 65–99)
Potassium: 4.1 mmol/L (ref 3.5–5.1)
Sodium: 135 mmol/L (ref 135–145)

## 2015-01-31 MED ORDER — PREDNISONE 20 MG PO TABS
60.0000 mg | ORAL_TABLET | Freq: Once | ORAL | Status: AC
  Administered 2015-01-31: 60 mg via ORAL
  Filled 2015-01-31: qty 3

## 2015-01-31 MED ORDER — ALBUTEROL SULFATE (2.5 MG/3ML) 0.083% IN NEBU
5.0000 mg | INHALATION_SOLUTION | Freq: Once | RESPIRATORY_TRACT | Status: AC
  Administered 2015-01-31: 5 mg via RESPIRATORY_TRACT
  Filled 2015-01-31: qty 6

## 2015-01-31 MED ORDER — IPRATROPIUM BROMIDE 0.02 % IN SOLN
0.5000 mg | Freq: Once | RESPIRATORY_TRACT | Status: AC
Start: 2015-01-31 — End: 2015-01-31
  Administered 2015-01-31: 0.5 mg via RESPIRATORY_TRACT
  Filled 2015-01-31: qty 2.5

## 2015-01-31 MED ORDER — SODIUM CHLORIDE 0.9 % IJ SOLN
INTRAMUSCULAR | Status: AC
  Filled 2015-01-31: qty 3

## 2015-01-31 MED ORDER — ASPIRIN 81 MG PO CHEW
324.0000 mg | CHEWABLE_TABLET | Freq: Once | ORAL | Status: AC
  Administered 2015-01-31: 324 mg via ORAL
  Filled 2015-01-31: qty 4

## 2015-01-31 NOTE — ED Provider Notes (Signed)
CSN: [id number]     Arrival date & time 01/31/15  2126 History   First MD Initiated Contact with Patient 01/31/15 2137     Chief Complaint  Patient presents with  . Chest Pain     (Consider location/radiation/quality/duration/timing/severity/associated sxs/prior Treatment) HPI   MR is a 44 yo female with a PMH of HTN who presents with midsternal chest pain x 2 days. [facility] sent her away due to a BP of 154/102 and she went to urgent care and was transferred to [facility] ED. Once she got to the Urgent Care she noticed she had some mild  CP is described as a dull pressure in the center of her chest that radiates to her left side. She has also had cough and some wheezing. She admits to hx of asthma. She has not had any lower extremity swelling.  PCP: EDWARDS, MICHELLE P, NP Blood pressure 107/82, pulse 104, temperature 98.4 F (36.9 C), temperature source Oral, resp. rate 18, height 5\' 5"  (1.651 m), weight 363 lb (164.656 kg), last menstrual period 02/13/2014, SpO2 95 %.  The patient denies diaphoresis, fever, headache, weakness (general or focal), confusion, change of vision,  neck pain, dysphagia, aphagia, back pain, abdominal pains, nausea, vomiting, diarrhea, lower extremity swelling, rash.   Past Medical History  Diagnosis Date  . High blood pressure   . Depression   . Anxiety   . Asthma     seasonal related/ albuterol used when uri  . H/O dizziness     Hx  . Arthritis     knee, hands  . Anemia     iv iron treatment dec 2014/ see oncology for this last in dec 2014  . SVD (spontaneous vaginal delivery)     x 3  . Headache(784.0)     otc med prn   Past Surgical History  Procedure Laterality Date  . Gallbladder surgery  04/04/2010  . Tubal ligation    . Cholecystectomy  10/11    lap choli  . Carpal tunnel release  08/30/2011    Procedure: CARPAL TUNNEL RELEASE;  Surgeon: Wynonia Sours, MD;  Location: [facility];  Service: Orthopedics;  Laterality: Right;  .  Intrauterine device insertion      07/19/2013  . Tonsillectomy and adenoidectomy Bilateral 09/08/2013    Procedure: TONSILLECTOMY ;  Surgeon: Ruby Cola, MD;  Location: [place];  Service: ENT;  Laterality: Bilateral;  . Septoplasty N/A 09/08/2013    Procedure: SEPTOPLASTY;  Surgeon: Ruby Cola, MD;  Location: [facility];  Service: ENT;  Laterality: N/A;  . Hysteroscopy with novasure N/A 02/08/2014    Procedure: ATTEMPTED NOVASURE ABLATION;  Surgeon: Woodroe Mode, MD;  Location: [facility];  Service: Gynecology;  Laterality: N/A;  . Hysteroscopy N/A 02/08/2014    Procedure: HYSTEROSCOPY;  Surgeon: Woodroe Mode, MD;  Location: [facility];  Service: Gynecology;  Laterality: N/A;  . Wisdom tooth extraction    . Vaginal hysterectomy N/A 04/12/2014    Procedure: LAPAROSCOPIC ASSISTED VAGINAL HYSTERECTOMY;  Surgeon: Woodroe Mode, MD;  Location: [place] [facility];  Service: Gynecology;  Laterality: N/A;  . Bilateral salpingectomy Bilateral 04/12/2014    Procedure: BILATERAL SALPINGECTOMY;  Surgeon: Woodroe Mode, MD;  Location: [facility];  Service: Gynecology;  Laterality: Bilateral;   Family History  Problem Relation Age of Onset  . Heart disease    . Diabetes    . Hypertension    . Alcohol abuse    . Diabetes Mother   . Hypertension  Father    Social History  Substance Use Topics  . Smoking status: Current Some Day Smoker -- 0.25 packs/day    Types: Cigarettes  . Smokeless tobacco: Never Used  . Alcohol Use: No   OB History    Gravida Para Term Preterm AB TAB SAB Ectopic Multiple Living   3 3 3  0 0 0 0 0 0 3     Review of Systems  10 Systems reviewed and are negative for acute change except as noted in the HPI.   Allergies  Review of patient's allergies indicates no known allergies.  Home Medications   Prior to Admission medications   Medication Sig Start Date End Date Taking? Authorizing Provider  albuterol (PROVENTIL HFA;VENTOLIN HFA) 108 (90 BASE) MCG/ACT inhaler Inhale 2 puffs into the lungs  every 6 (six) hours as needed for wheezing or shortness of breath.    Yes Historical Provider, MD  Aspirin Effervescent (ALKA-SELTZER PO) Take 2 tablets by mouth daily as needed (cold symptoms).   Yes Historical Provider, MD  beclomethasone (QVAR) 40 MCG/ACT inhaler Inhale 2 puffs into the lungs 2 (two) times daily.   Yes Historical Provider, MD  citalopram (CELEXA) 20 MG tablet Take 20 mg by mouth daily.   Yes Historical Provider, MD  doxepin (SINEQUAN) 25 MG capsule Take 25 mg by mouth at bedtime.   Yes Historical Provider, MD  ibuprofen (ADVIL,MOTRIN) 800 MG tablet Take 800 mg by mouth every 8 (eight) hours as needed for moderate pain.   Yes Historical Provider, MD  lisinopril (PRINIVIL,ZESTRIL) 20 MG tablet Take 20 mg by mouth daily.   Yes Historical Provider, MD  loratadine-pseudoephedrine (CLARITIN-D 24-HOUR) 10-240 MG per 24 hr tablet Take 1 tablet by mouth daily. 04/21/13  Yes Historical Provider, MD  azithromycin (ZITHROMAX) 250 MG tablet Take 1 tablet (250 mg total) by mouth daily. Take first 2 tablets together, then 1 every day until finished. 02/01/15   Charlann Lange, PA-C  predniSONE (DELTASONE) 10 MG tablet Take 2 tablets (20 mg total) by mouth daily. 02/01/15   Shari Upstill, PA-C   BP 107/82 mmHg  Pulse 104  Temp(Src) 98.4 F (36.9 C) (Oral)  Resp 18  Ht 5\' 5"  (1.651 m)  Wt 363 lb (164.656 kg)  BMI 60.41 kg/m2  SpO2 95%  LMP 02/13/2014 Physical Exam  Constitutional: She appears well-developed and well-nourished. No distress.  HENT:  Head: Normocephalic and atraumatic.  Eyes: Pupils are equal, round, and reactive to light.  Neck: Normal range of motion. Neck supple.  Cardiovascular: Normal rate and regular rhythm.   Pulmonary/Chest: Effort normal. No accessory muscle usage. No respiratory distress. She has no decreased breath sounds. She has wheezes (mild expiratory wheezing). She has no rhonchi. She exhibits no tenderness and no bony tenderness.  Abdominal: Soft. Bowel  sounds are normal. She exhibits no distension. There is no tenderness. There is no rigidity, no rebound and no guarding.  Musculoskeletal:  No LE swelling  Pain to chest wall with ROM of bilateral arms and pressure to chest.  Neurological: She is alert.  Skin: Skin is warm and dry.  Nursing note and vitals reviewed.   ED Course  Procedures (including critical care time) Labs Review Labs Reviewed  BASIC METABOLIC PANEL - Abnormal; Notable for the following:    Chloride 99 (*)    Glucose, Bld 110 (*)    All other components within normal limits  CBC - Abnormal; Notable for the following:    RBC 6.52 (*)  Hemoglobin 15.2 (*)    MCV 67.2 (*)    MCH 23.3 (*)    RDW 19.3 (*)    Platelets 404 (*)    All other components within normal limits  D-DIMER, QUANTITATIVE (NOT AT [facility])  I-STAT TROPOININ, ED    Imaging Review No results found.   EKG Interpretation   Date/Time:  Tuesday January 31 2015 21:31:20 EDT Ventricular Rate:  96 PR Interval:  189 QRS Duration: 80 QT Interval:  351 QTC Calculation: 443 R Axis:   75 Text Interpretation:  Sinus rhythm No significant change was found  Confirmed by Wyvonnia Dusky  MD, STEPHEN [phone]) on 01/31/2015 9:42:46 PM      MDM   Final diagnoses:  Upper respiratory infection    Troponin, BMP and CBC  As well as chest xray unremarkable. Patient seen by Dr. Wyvonnia Dusky who added on d-dimer. At end of shift, sign out to Charlann Lange, PA-C, d-dimer is pending. To be discharged with Azithromycin and prednisone, Albuterol given in ED if d-dimer is negative.  Patient had significant improvement with therapies given in the ED.  Medications  aspirin chewable tablet 324 mg (324 mg Oral Given 01/31/15 2227)  predniSONE (DELTASONE) tablet 60 mg (60 mg Oral Given 01/31/15 2327)  albuterol (PROVENTIL) (2.5 MG/3ML) 0.083% nebulizer solution 5 mg (5 mg Nebulization Given 01/31/15 2328)  ipratropium (ATROVENT) nebulizer solution 0.5 mg (0.5 mg Nebulization Given  01/31/15 2328)    Filed Vitals:   02/01/15 0230  BP: 107/82  Pulse: 104  Temp:   Resp: [address] PA-C 02/04/15 1847  Delos Haring, PA-C 02/04/15 1852  Leonard Schwartz, MD 02/05/15 1535

## 2015-01-31 NOTE — ED Notes (Signed)
Pt has been suffering from blurred vision for the last two weeks. Last week she states both her feet were very swollen so she kept them elevated.  Pt has had dizziness and nausea for two days and this morning had HBP at [facility].  Pt was encouraged to go to the ED, but she decided to come here after having CP that she describes as acid reflux.

## 2015-01-31 NOTE — ED Notes (Signed)
Pt being transported to ED via EMS for further workup for CP, SOB, and diaphoresis.  Report called to EMS and ED Charge RN, Lilia Pro.

## 2015-01-31 NOTE — ED Notes (Signed)
Pt c/o CP with SOB/nausea/dizziness that has been worsening over the past 2 weeks. Transfer from urgent care.

## 2015-01-31 NOTE — ED Provider Notes (Signed)
CSN: [id number]     Arrival date & time 01/31/15  1855 History   None    Chief Complaint  Patient presents with  . Chest Pain  . Nausea  . Hypertension  . Dizziness   (Consider location/radiation/quality/duration/timing/severity/associated sxs/prior Treatment)  HPI  Patient is a 44 year old female presenting today with complaints of left anterior to central chest pain, shortness of breath, positive nausea  but negative vomiting. Patient has reported profuse diaphoresis intermittently for the past 2 weeks with her symptoms.  Patient is morbidly obese. History significant for asthma.  Extreme onset of pedal edema with chest pain 2 weeks ago.  The patient states she was unable to wear shoes, but they are "getting better".  Pedal edema still present bilaterally.      Past Medical History  Diagnosis Date  . High blood pressure   . Depression   . Anxiety   . Asthma     seasonal related/ albuterol used when uri  . H/O dizziness     Hx  . Arthritis     knee, hands  . Anemia     iv iron treatment dec 2014/ see oncology for this last in dec 2014  . SVD (spontaneous vaginal delivery)     x 3  . Headache(784.0)     otc med prn   Past Surgical History  Procedure Laterality Date  . Gallbladder surgery  04/04/2010  . Tubal ligation    . Cholecystectomy  10/11    lap choli  . Carpal tunnel release  08/30/2011    Procedure: CARPAL TUNNEL RELEASE;  Surgeon: Wynonia Sours, MD;  Location: [facility];  Service: Orthopedics;  Laterality: Right;  . Intrauterine device insertion      07/19/2013  . Tonsillectomy and adenoidectomy Bilateral 09/08/2013    Procedure: TONSILLECTOMY ;  Surgeon: Ruby Cola, MD;  Location: [facility];  Service: ENT;  Laterality: Bilateral;  . Septoplasty N/A 09/08/2013    Procedure: SEPTOPLASTY;  Surgeon: Ruby Cola, MD;  Location: [facility];  Service: ENT;  Laterality: N/A;  . Hysteroscopy with novasure N/A 02/08/2014    Procedure: ATTEMPTED NOVASURE ABLATION;   Surgeon: Woodroe Mode, MD;  Location: [facility];  Service: Gynecology;  Laterality: N/A;  . Hysteroscopy N/A 02/08/2014    Procedure: HYSTEROSCOPY;  Surgeon: Woodroe Mode, MD;  Location: [facility];  Service: Gynecology;  Laterality: N/A;  . Wisdom tooth extraction    . Vaginal hysterectomy N/A 04/12/2014    Procedure: LAPAROSCOPIC ASSISTED VAGINAL HYSTERECTOMY;  Surgeon: Woodroe Mode, MD;  Location: [facility];  Service: Gynecology;  Laterality: N/A;  . Bilateral salpingectomy Bilateral 04/12/2014    Procedure: BILATERAL SALPINGECTOMY;  Surgeon: Woodroe Mode, MD;  Location: [facility];  Service: Gynecology;  Laterality: Bilateral;   Family History  Problem Relation Age of Onset  . Heart disease    . Diabetes    . Hypertension    . Alcohol abuse    . Diabetes Mother   . Hypertension Father    History  Substance Use Topics  . Smoking status: Current Some Day Smoker -- 0.25 packs/day    Types: Cigarettes  . Smokeless tobacco: Never Used  . Alcohol Use: No   OB History    Gravida Para Term Preterm AB TAB SAB Ectopic Multiple Living   3 3 3  0 0 0 0 0 0 3     Review of Systems  Constitutional: Negative.  Negative for fever and chills.  HENT: Negative.  Negative for sinus pressure, sneezing and sore throat.   Eyes: Negative.   Respiratory: Positive for cough, chest tightness and shortness of breath. Negative for wheezing.   Cardiovascular: Positive for chest pain and leg swelling. Negative for palpitations.  Gastrointestinal: Negative.   Endocrine: Negative.   Genitourinary: Negative.   Musculoskeletal: Negative.   Skin: Negative.  Negative for pallor and rash.  Allergic/Immunologic: Positive for environmental allergies. Negative for food allergies and immunocompromised state.  Neurological: Negative.   Hematological: Negative.   Psychiatric/Behavioral: Negative.     Allergies  Review of patient's allergies indicates no known allergies.  Home Medications   Prior to Admission  medications   Medication Sig Start Date End Date Taking? Authorizing Provider  citalopram (CELEXA) 20 MG tablet Take 20 mg by mouth daily.   Yes Historical Provider, MD  doxepin (SINEQUAN) 25 MG capsule Take 25 mg by mouth at bedtime.   Yes Historical Provider, MD  ibuprofen (ADVIL,MOTRIN) 800 MG tablet Take 800 mg by mouth every 8 (eight) hours as needed for moderate pain.   Yes Historical Provider, MD  lisinopril (PRINIVIL,ZESTRIL) 20 MG tablet Take 20 mg by mouth daily.   Yes Historical Provider, MD  loratadine-pseudoephedrine (CLARITIN-D 24-HOUR) 10-240 MG per 24 hr tablet Take 1 tablet by mouth daily. 04/21/13  Yes Historical Provider, MD  albuterol (PROVENTIL HFA;VENTOLIN HFA) 108 (90 BASE) MCG/ACT inhaler Inhale 2 puffs into the lungs every 6 (six) hours as needed for wheezing or shortness of breath.     Historical Provider, MD  beclomethasone (QVAR) 40 MCG/ACT inhaler Inhale 2 puffs into the lungs 2 (two) times daily.    Historical Provider, MD  ferrous gluconate (FERGON) 324 MG tablet Take 324 mg by mouth daily with breakfast.    Historical Provider, MD  lactulose (CHRONULAC) 10 GM/15ML solution Take 30 mLs (20 g total) by mouth 2 (two) times daily. Or as directed 09/12/14   Lennis P Livesay, MD   BP 157/107 mmHg  Pulse 132  Temp(Src) 98.8 F (37.1 C) (Oral)  Resp 18  SpO2 95%  LMP 02/13/2014   Physical Exam  Constitutional: She is oriented to person, place, and time. She appears well-developed and well-nourished. No distress.  Eyes: Right eye exhibits no discharge. Left eye exhibits no discharge. No scleral icterus.  Neck: Normal range of motion. Neck supple. No thyromegaly present.  Cardiovascular: Regular rhythm, normal heart sounds and intact distal pulses.  Exam reveals no gallop and no friction rub.   Patient is tachycardic.  HR on arrival to room 132, resting HR 112.  Unable to assess adequately for murmur with tachycardia.   Pulmonary/Chest: Effort normal and breath sounds  normal. No respiratory distress. She has no wheezes. She has no rales. She exhibits no tenderness.  Diminished breath sounds throughout.    Neurological: She is alert and oriented to person, place, and time.  Skin: Skin is warm and dry. She is not diaphoretic. No pallor.  Nursing note and vitals reviewed.    Diminished air sounds heard throughout all lobes, however examination limited by body habitus. The patient is tachycardic, but has not used her albuterol inhaler since yesterday. Patient was tachycardic at a rate of 132 on arrival. Patient's resting heart rate is 112. EKG shows sinus tach.  ED Course  Procedures (including critical care time) Labs Review Labs Reviewed - No data to display  Imaging Review No results found.  Plan of care was discussed with Dr. Bridgett Larsson.  Patient transferred via EMS to [facility]  ED.  Patient was placed on [facility] and IV started KVO.  Patient placed on monitor waiting transfer.   MDM   1. Chest pain, unspecified chest pain type   2. Shortness of breath   3. Nausea    The patient verbalizes understanding and agrees to plan of care.       Nehemiah Settle, NP 01/31/15 2113

## 2015-02-01 LAB — D-DIMER, QUANTITATIVE (NOT AT ARMC): D DIMER QUANT: 0.44 ug{FEU}/mL (ref 0.00–0.48)

## 2015-02-01 MED ORDER — ALBUTEROL SULFATE HFA 108 (90 BASE) MCG/ACT IN AERS
2.0000 | INHALATION_SPRAY | RESPIRATORY_TRACT | Status: DC | PRN
  Filled 2015-02-01: qty 6.7

## 2015-02-01 MED ORDER — PREDNISONE 10 MG PO TABS: 20.0000 mg | ORAL_TABLET | Freq: Every day | ORAL | Status: DC

## 2015-02-01 MED ORDER — AZITHROMYCIN 250 MG PO TABS: 250.0000 mg | ORAL_TABLET | Freq: Every day | ORAL | Status: DC

## 2015-02-01 NOTE — Care Management (Signed)
ED CM received call from [place] regarding discharge prescription clarification on predinisone. Clarification provided. No further questions or concerns voiced.

## 2015-02-01 NOTE — ED Notes (Signed)
Please see downtime charting for Proventil administration.

## 2015-02-01 NOTE — ED Notes (Signed)
Pt. Stable, ambulatory, and states understanding of discharge instructions.

## 2015-02-01 NOTE — Discharge Instructions (Signed)
Upper Respiratory Infection, Adult An upper respiratory infection (URI) is also sometimes known as the common cold. The upper respiratory tract includes the nose, sinuses, throat, trachea, and bronchi. Bronchi are the airways leading to the lungs. Most people improve within 1 week, but symptoms can last up to 2 weeks. A residual cough may last even longer.  CAUSES Many different viruses can infect the tissues lining the upper respiratory tract. The tissues become irritated and inflamed and often become very moist. Mucus production is also common. A cold is contagious. You can easily spread the virus to others by oral contact. This includes kissing, sharing a glass, coughing, or sneezing. Touching your mouth or nose and then touching a surface, which is then touched by another person, can also spread the virus. SYMPTOMS  Symptoms typically develop 1 to 3 days after you come in contact with a cold virus. Symptoms vary from person to person. They may include:  Runny nose.  Sneezing.  Nasal congestion.  Sinus irritation.  Sore throat.  Loss of voice (laryngitis).  Cough.  Fatigue.  Muscle aches.  Loss of appetite.  Headache.  Low-grade fever. DIAGNOSIS  You might diagnose your own cold based on familiar symptoms, since most people get a cold 2 to 3 times a year. Your caregiver can confirm this based on your exam. Most importantly, your caregiver can check that your symptoms are not due to another disease such as strep throat, sinusitis, pneumonia, asthma, or epiglottitis. Blood tests, throat tests, and X-rays are not necessary to diagnose a common cold, but they may sometimes be helpful in excluding other more serious diseases. Your caregiver will decide if any further tests are required. RISKS AND COMPLICATIONS  You may be at risk for a more severe case of the common cold if you smoke cigarettes, have chronic heart disease (such as heart failure) or lung disease (such as asthma), or if  you have a weakened immune system. The very young and very old are also at risk for more serious infections. Bacterial sinusitis, middle ear infections, and bacterial pneumonia can complicate the common cold. The common cold can worsen asthma and chronic obstructive pulmonary disease (COPD). Sometimes, these complications can require emergency medical care and may be life-threatening. PREVENTION  The best way to protect against getting a cold is to practice good hygiene. Avoid oral or hand contact with people with cold symptoms. Wash your hands often if contact occurs. There is no clear evidence that vitamin C, vitamin E, echinacea, or exercise reduces the chance of developing a cold. However, it is always recommended to get plenty of rest and practice good nutrition. TREATMENT  Treatment is directed at relieving symptoms. There is no cure. Antibiotics are not effective, because the infection is caused by a virus, not by bacteria. Treatment may include:  Increased fluid intake. Sports drinks offer valuable electrolytes, sugars, and fluids.  Breathing heated mist or steam (vaporizer or shower).  Eating chicken soup or other clear broths, and maintaining good nutrition.  Getting plenty of rest.  Using gargles or lozenges for comfort.  Controlling fevers with ibuprofen or acetaminophen as directed by your caregiver.  Increasing usage of your inhaler if you have asthma. Zinc gel and zinc lozenges, taken in the first 24 hours of the common cold, can shorten the duration and lessen the severity of symptoms. Pain medicines may help with fever, muscle aches, and throat pain. A variety of non-prescription medicines are available to treat congestion and runny nose. Your caregiver   can make recommendations and may suggest nasal or lung inhalers for other symptoms.  HOME CARE INSTRUCTIONS   Only take over-the-counter or prescription medicines for pain, discomfort, or fever as directed by your  caregiver.  Use a warm mist humidifier or inhale steam from a shower to increase air moisture. This may keep secretions moist and make it easier to breathe.  Drink enough water and fluids to keep your urine clear or pale yellow.  Rest as needed.  Return to work when your temperature has returned to normal or as your caregiver advises. You may need to stay home longer to avoid infecting others. You can also use a face mask and careful hand washing to prevent spread of the virus. SEEK MEDICAL CARE IF:   After the first few days, you feel you are getting worse rather than better.  You need your caregiver's advice about medicines to control symptoms.  You develop chills, worsening shortness of breath, or brown or red sputum. These may be signs of pneumonia.  You develop yellow or brown nasal discharge or pain in the face, especially when you bend forward. These may be signs of sinusitis.  You develop a fever, swollen neck glands, pain with swallowing, or white areas in the back of your throat. These may be signs of strep throat. SEEK IMMEDIATE MEDICAL CARE IF:   You have a fever.  You develop severe or persistent headache, ear pain, sinus pain, or chest pain.  You develop wheezing, a prolonged cough, cough up blood, or have a change in your usual mucus (if you have chronic lung disease).  You develop sore muscles or a stiff neck. Document Released: 12/04/2000 Document Revised: 09/02/2011 Document Reviewed: 09/15/2013 Exit[employer] Patient Information 2015 [employer]. This information is not intended to replace advice given to you by your health care provider. Make sure you discuss any questions you have with your health care provider.  

## 2015-02-03 NOTE — ED Provider Notes (Signed)
Patient care given to me by Delos Haring, PA-C, with d-dimer pending r/o PE, which is negative. She can be discharged home per instructions provided by PA Carlota Raspberry. Discussed results with the patient and she has no unanswered questions. She is comfortable with discharge home.   Charlann Lange, PA-C 02/03/15 0071  Everlene Balls, MD 02/03/15 [id number]

## 2015-08-28 ENCOUNTER — Emergency Department (HOSPITAL_COMMUNITY)
Admission: EM | Admit: 2015-08-28 | Discharge: 2015-08-28 | Disposition: A | Discharge status: Home / Self Care | Payer: No Typology Code available for payment source | Source: Home / Self Care | Attending: Family Medicine | Admitting: Family Medicine

## 2015-08-28 ENCOUNTER — Encounter (HOSPITAL_COMMUNITY): Payer: Self-pay | Admitting: Emergency Medicine

## 2015-08-28 DIAGNOSIS — J40 Bronchitis, not specified as acute or chronic: Secondary | ICD-10-CM

## 2015-08-28 DIAGNOSIS — B349 Viral infection, unspecified: Secondary | ICD-10-CM

## 2015-08-28 DIAGNOSIS — J9801 Acute bronchospasm: Secondary | ICD-10-CM

## 2015-08-28 MED ORDER — AZITHROMYCIN 250 MG PO TABS: 250.0000 mg | ORAL_TABLET | Freq: Every day | ORAL | Status: DC

## 2015-08-28 MED ORDER — METHYLPREDNISOLONE SODIUM SUCC 125 MG IJ SOLR
125.0000 mg | Freq: Once | INTRAMUSCULAR | Status: AC
  Administered 2015-08-28: 125 mg via INTRAMUSCULAR

## 2015-08-28 MED ORDER — IPRATROPIUM-ALBUTEROL 0.5-2.5 (3) MG/3ML IN SOLN
3.0000 mL | Freq: Once | RESPIRATORY_TRACT | Status: AC
  Administered 2015-08-28: 3 mL via RESPIRATORY_TRACT

## 2015-08-28 MED ORDER — PREDNISONE 10 MG PO TABS
ORAL_TABLET | ORAL | Status: DC
Start: 2015-08-28 — End: 2017-07-03

## 2015-08-28 MED ORDER — IPRATROPIUM-ALBUTEROL 0.5-2.5 (3) MG/3ML IN SOLN
RESPIRATORY_TRACT | Status: AC
Start: 2015-08-28 — End: 2015-08-28
  Filled 2015-08-28: qty 3

## 2015-08-28 MED ORDER — METHYLPREDNISOLONE SODIUM SUCC 125 MG IJ SOLR
INTRAMUSCULAR | Status: AC
  Filled 2015-08-28: qty 2

## 2015-08-28 NOTE — ED Notes (Signed)
Pt feels a little better post breathing treatment, wheezing with labored breathing noted

## 2015-08-28 NOTE — ED Provider Notes (Addendum)
CSN: [id number]     Arrival date & time 08/28/15  1824 History   First MD Initiated Contact with Patient 08/28/15 1840     No chief complaint on file.  (Consider location/radiation/quality/duration/timing/severity/associated sxs/prior Treatment) HPIasked by nursing staff to see patient urgently: History obtained from patient:  Pt presents with the cc BP:[id number] of breath, wheezing Duration of symptoms:2 days Treatment prior to arrival:inhaler Previous symptoms of this type before:in the last couple of months but not this bad Pain score:0 Other symptoms include: feeling tired No previous ICU or intubation for asthma Stopped smoking 6 months ago  Past Medical History  Diagnosis Date  . High blood pressure   . Depression   . Anxiety   . Asthma     seasonal related/ albuterol used when uri  . H/O dizziness     Hx  . Arthritis     knee, hands  . Anemia     iv iron treatment dec 2014/ see oncology for this last in dec 2014  . SVD (spontaneous vaginal delivery)     x 3  . Headache(784.0)     otc med prn   Past Surgical History  Procedure Laterality Date  . Gallbladder surgery  04/04/2010  . Tubal ligation    . Cholecystectomy  10/11    lap choli  . Carpal tunnel release  08/30/2011    Procedure: CARPAL TUNNEL RELEASE;  Surgeon: Wynonia Sours, MD;  Location: [facility];  Service: Orthopedics;  Laterality: Right;  . Intrauterine device insertion      07/19/2013  . Tonsillectomy and adenoidectomy Bilateral 09/08/2013    Procedure: TONSILLECTOMY ;  Surgeon: Ruby Cola, MD;  Location: [facility];  Service: ENT;  Laterality: Bilateral;  . Septoplasty N/A 09/08/2013    Procedure: SEPTOPLASTY;  Surgeon: Ruby Cola, MD;  Location: [facility];  Service: ENT;  Laterality: N/A;  . Hysteroscopy with novasure N/A 02/08/2014    Procedure: ATTEMPTED NOVASURE ABLATION;  Surgeon: Woodroe Mode, MD;  Location: [facility];  Service: Gynecology;  Laterality: N/A;  . Hysteroscopy N/A  02/08/2014    Procedure: HYSTEROSCOPY;  Surgeon: Woodroe Mode, MD;  Location: [facility];  Service: Gynecology;  Laterality: N/A;  . Wisdom tooth extraction    . Vaginal hysterectomy N/A 04/12/2014    Procedure: LAPAROSCOPIC ASSISTED VAGINAL HYSTERECTOMY;  Surgeon: Woodroe Mode, MD;  Location: [facility];  Service: Gynecology;  Laterality: N/A;  . Bilateral salpingectomy Bilateral 04/12/2014    Procedure: BILATERAL SALPINGECTOMY;  Surgeon: Woodroe Mode, MD;  Location: [facility];  Service: Gynecology;  Laterality: Bilateral;   Family History  Problem Relation Age of Onset  . Heart disease    . Diabetes    . Hypertension    . Alcohol abuse    . Diabetes Mother   . Hypertension Father    Social History  Substance Use Topics  . Smoking status: Current Some Day Smoker -- 0.25 packs/day    Types: Cigarettes  . Smokeless tobacco: Never Used  . Alcohol Use: No   OB History    Gravida Para Term Preterm AB TAB SAB Ectopic Multiple Living   3 3 3  0 0 0 0 0 0 3     Review of Systems Cough, wheezing Allergies  Review of patient's allergies indicates no known allergies.  Home Medications   Prior to Admission medications   Medication Sig Start Date End Date Taking? Authorizing Provider  albuterol (PROVENTIL HFA;VENTOLIN HFA) 108 (90 BASE) MCG/ACT inhaler  Inhale 2 puffs into the lungs every 6 (six) hours as needed for wheezing or shortness of breath.     Historical Provider, MD  Aspirin Effervescent (ALKA-SELTZER PO) Take 2 tablets by mouth daily as needed (cold symptoms).    Historical Provider, MD  azithromycin (ZITHROMAX) 250 MG tablet Take 1 tablet (250 mg total) by mouth daily. Take first 2 tablets together, then 1 every day until finished. 02/01/15   Charlann Lange, PA-C  beclomethasone (QVAR) 40 MCG/ACT inhaler Inhale 2 puffs into the lungs 2 (two) times daily.    Historical Provider, MD  citalopram (CELEXA) 20 MG tablet Take 20 mg by mouth daily.    Historical Provider, MD  doxepin  (SINEQUAN) 25 MG capsule Take 25 mg by mouth at bedtime.    Historical Provider, MD  ibuprofen (ADVIL,MOTRIN) 800 MG tablet Take 800 mg by mouth every 8 (eight) hours as needed for moderate pain.    Historical Provider, MD  lisinopril (PRINIVIL,ZESTRIL) 20 MG tablet Take 20 mg by mouth daily.    Historical Provider, MD  loratadine-pseudoephedrine (CLARITIN-D 24-HOUR) 10-240 MG per 24 hr tablet Take 1 tablet by mouth daily. 04/21/13   Historical Provider, MD  predniSONE (DELTASONE) 10 MG tablet Take 2 tablets (20 mg total) by mouth daily. 02/01/15   Charlann Lange, PA-C   Meds Ordered and Administered this Visit   Medications  ipratropium-albuterol (DUONEB) 0.5-2.5 (3) MG/3ML nebulizer solution 3 mL (not administered)  methylPREDNISolone sodium succinate (SOLU-MEDROL) 125 mg/2 mL injection 125 mg (not administered)    BP 140/107 mmHg  Pulse 122  Temp(Src) 100.9 F (38.3 C) (Oral)  Resp 24  SpO2 97%  LMP 02/13/2014 No data found.   Physical Exam  NURSES NOTES AND VITAL SIGNS REVIEWED. CONSTITUTIONAL: Well developed, well nourished, no acute distress HEENT: normocephalic, atraumatic, right and left TM's are normal EYES: Conjunctiva normal NECK:normal ROM, supple, no adenopathy PULMONARY:  Lungs: Wheezes, throughout lung fields CARDIOVASCULAR: RRR, no murmur ABDOMEN: soft, ND, NT, +'ve BS MUSCULOSKELETAL: Normal ROM of all extremities,  SKIN: warm and dry without rash PSYCHIATRIC: Mood and affect, behavior are normal  Post treatment exam: lungs are much clearer. Minimal wheeze.   ED Course  Procedures (including critical care time)  Labs Review Labs Reviewed - No data to display  Imaging Review No results found.   Visual Acuity Review  Right Eye Distance:   Left Eye Distance:   Bilateral Distance:    Right Eye Near:   Left Eye Near:    Bilateral Near:        Treatment: duoneb treatment with improvement. Pt states she is breathing easier at this time. She is  speaking in full sentences.  States that she will go to hospital if symptoms get worse at home. Pt is given the option of going to the ER at this time.  MDM   1. Bronchitis   2. Acute bronchospasm due to viral infection    Patient is reassured that there are no issues that require transfer to higher level of care at this time.  Patient is advised to continue home symptomatic treatment. Prescription is sent to  pharmacy patient has indicated.  Patient is advised that if there are new or worsening symptoms or attend the emergency department, or contact primary care provider. Instructions of care provided discharged home in stable condition. Return to work/school note provided.  THIS NOTE WAS GENERATED USING A VOICE RECOGNITION SOFTWARE PROGRAM. ALL REASONABLE EFFORTS  WERE MADE TO PROOFREAD THIS DOCUMENT FOR  ACCURACY.     Konrad Felix, PA 08/28/15 [place], [place] 08/29/15 [place], PA 11/02/15 1321

## 2015-08-28 NOTE — Discharge Instructions (Signed)
Upper Respiratory Infection, Adult °Most upper respiratory infections (URIs) are a viral infection of the air passages leading to the lungs. A URI affects the nose, throat, and upper air passages. The most common type of URI is nasopharyngitis and is typically referred to as "the common cold." °URIs run their course and usually go away on their own. Most of the time, a URI does not require medical attention, but sometimes a bacterial infection in the upper airways can follow a viral infection. This is called a secondary infection. Sinus and middle ear infections are common types of secondary upper respiratory infections. °Bacterial pneumonia can also complicate a URI. A URI can worsen asthma and chronic obstructive pulmonary disease (COPD). Sometimes, these complications can require emergency medical care and may be life threatening.  °CAUSES °Almost all URIs are caused by viruses. A virus is a type of germ and can spread from one person to another.  °RISKS FACTORS °You may be at risk for a URI if:  °· You smoke.   °· You have chronic heart or lung disease. °· You have a weakened defense (immune) system.   °· You are very young or very old.   °· You have nasal allergies or asthma. °· You work in crowded or poorly ventilated areas. °· You work in health care facilities or schools. °SIGNS AND SYMPTOMS  °Symptoms typically develop 2-3 days after you come in contact with a cold virus. Most viral URIs last 7-10 days. However, viral URIs from the influenza virus (flu virus) can last 14-18 days and are typically more severe. Symptoms may include:  °· Runny or stuffy (congested) nose.   °· Sneezing.   °· Cough.   °· Sore throat.   °· Headache.   °· Fatigue.   °· Fever.   °· Loss of appetite.   °· Pain in your forehead, behind your eyes, and over your cheekbones (sinus pain). °· Muscle aches.   °DIAGNOSIS  °Your health care provider may diagnose a URI by: °· Physical exam. °· Tests to check that your symptoms are not due to  another condition such as: °· Strep throat. °· Sinusitis. °· Pneumonia. °· Asthma. °TREATMENT  °A URI goes away on its own with time. It cannot be cured with medicines, but medicines may be prescribed or recommended to relieve symptoms. Medicines may help: °· Reduce your fever. °· Reduce your cough. °· Relieve nasal congestion. °HOME CARE INSTRUCTIONS  °· Take medicines only as directed by your health care provider.   °· Gargle warm saltwater or take cough drops to comfort your throat as directed by your health care provider. °· Use a warm mist humidifier or inhale steam from a shower to increase air moisture. This may make it easier to breathe. °· Drink enough fluid to keep your urine clear or pale yellow.   °· Eat soups and other clear broths and maintain good nutrition.   °· Rest as needed.   °· Return to work when your temperature has returned to normal or as your health care provider advises. You may need to stay home longer to avoid infecting others. You can also use a face mask and careful hand washing to prevent spread of the virus. °· Increase the usage of your inhaler if you have asthma.   °· Do not use any tobacco products, including cigarettes, chewing tobacco, or electronic cigarettes. If you need help quitting, ask your health care provider. °PREVENTION  °The best way to protect yourself from getting a cold is to practice good hygiene.  °· Avoid oral or hand contact with people with cold   symptoms.   °· Wash your hands often if contact occurs.   °There is no clear evidence that vitamin C, vitamin E, echinacea, or exercise reduces the chance of developing a cold. However, it is always recommended to get plenty of rest, exercise, and practice good nutrition.  °SEEK MEDICAL CARE IF:  °· You are getting worse rather than better.   °· Your symptoms are not controlled by medicine.   °· You have chills. °· You have worsening shortness of breath. °· You have brown or red mucus. °· You have yellow or brown nasal  discharge. °· You have pain in your face, especially when you bend forward. °· You have a fever. °· You have swollen neck glands. °· You have pain while swallowing. °· You have white areas in the back of your throat. °SEEK IMMEDIATE MEDICAL CARE IF:  °· You have severe or persistent: °¨ Headache. °¨ Ear pain. °¨ Sinus pain. °¨ Chest pain. °· You have chronic lung disease and any of the following: °¨ Wheezing. °¨ Prolonged cough. °¨ Coughing up blood. °¨ A change in your usual mucus. °· You have a stiff neck. °· You have changes in your: °¨ Vision. °¨ Hearing. °¨ Thinking. °¨ Mood. °MAKE SURE YOU:  °· Understand these instructions. °· Will watch your condition. °· Will get help right away if you are not doing well or get worse. °  °This information is not intended to replace advice given to you by your health care provider. Make sure you discuss any questions you have with your health care provider. °  °Document Released: 12/04/2000 Document Revised: 10/25/2014 Document Reviewed: 09/15/2013 °Elsevier Interactive Patient Education ©2016 [employer] ° °Asthma, Acute Bronchospasm °Acute bronchospasm caused by asthma is also referred to as an asthma attack. Bronchospasm means your air passages become narrowed. The narrowing is caused by inflammation and tightening of the muscles in the air tubes (bronchi) in your lungs. This can make it hard to breathe or cause you to wheeze and cough. °CAUSES °Possible triggers are: °· Animal dander from the skin, hair, or feathers of animals. °· Dust mites contained in house dust. °· Cockroaches. °· Pollen from trees or grass. °· Mold. °· Cigarette or tobacco smoke. °· Air pollutants such as dust, household cleaners, hair sprays, aerosol sprays, paint fumes, strong chemicals, or strong odors. °· Cold air or weather changes. Cold air may trigger inflammation. Winds increase molds and pollens in the air. °· Strong emotions such as crying or laughing hard. °· Stress. °· Certain  medicines such as aspirin or beta-blockers. °· Sulfites in foods and drinks, such as dried fruits and wine. °· Infections or inflammatory conditions, such as a flu, cold, or inflammation of the nasal membranes (rhinitis). °· Gastroesophageal reflux disease (GERD). GERD is a condition where stomach acid backs up into your esophagus. °· Exercise or strenuous activity. °SIGNS AND SYMPTOMS  °· Wheezing. °· Excessive coughing, particularly at night. °· Chest tightness. °· Shortness of breath. °DIAGNOSIS  °Your health care provider will ask you about your medical history and perform a physical exam. A chest X-ray or blood testing may be performed to look for other causes of your symptoms or other conditions that may have triggered your asthma attack.  °TREATMENT  °Treatment is aimed at reducing inflammation and opening up the airways in your lungs.  Most asthma attacks are treated with inhaled medicines. These include quick relief or rescue medicines (such as bronchodilators) and controller medicines (such as inhaled corticosteroids). These medicines are sometimes given through an inhaler   or a nebulizer. Systemic steroid medicine taken by mouth or given through an IV tube also can be used to reduce the inflammation when an attack is moderate or severe. Antibiotic medicines are only used if a bacterial infection is present.  °HOME CARE INSTRUCTIONS  °· Rest. °· Drink plenty of liquids. This helps the mucus to remain thin and be easily coughed up. Only use caffeine in moderation and do not use alcohol until you have recovered from your illness. °· Do not smoke. Avoid being exposed to secondhand smoke. °· You play a critical role in keeping yourself in good health. Avoid exposure to things that cause you to wheeze or to have breathing problems. °· Keep your medicines up-to-date and available. Carefully follow your health care provider's treatment plan. °· Take your medicine exactly as prescribed. °· When pollen or pollution  is bad, keep windows closed and use an air conditioner or go to places with air conditioning. °· Asthma requires careful medical care. See your health care provider for a follow-up as advised. If you are more than [redacted] weeks pregnant and you were prescribed any new medicines, let your obstetrician know about the visit and how you are doing. Follow up with your health care provider as directed. °· After you have recovered from your asthma attack, make an appointment with your outpatient doctor to talk about ways to reduce the likelihood of future attacks. If you do not have a doctor who manages your asthma, make an appointment with a primary care doctor to discuss your asthma. °SEEK IMMEDIATE MEDICAL CARE IF:  °· You are getting worse. °· You have trouble breathing. If severe, call your local emergency services (911 in the [place] °· You develop chest pain or discomfort. °· You are vomiting. °· You are not able to keep fluids down. °· You are coughing up yellow, green, brown, or bloody sputum. °· You have a fever and your symptoms suddenly get worse. °· You have trouble swallowing. °MAKE SURE YOU:  °· Understand these instructions. °· Will watch your condition. °· Will get help right away if you are not doing well or get worse. °  °This information is not intended to replace advice given to you by your health care provider. Make sure you discuss any questions you have with your health care provider. °  °Document Released: 09/25/2006 Document Revised: 06/15/2013 Document Reviewed: 12/16/2012 °Elsevier Interactive Patient Education ©2016 [employer] ° °

## 2015-08-28 NOTE — ED Notes (Signed)
Pt brought back urgent for asthma exacerbation, tachycardic with elevated blood pressure Sx's started 2 dys ago unrelieved by inhaler  Chest tightness with diaphoresis present Sats 97%ra

## 2015-09-15 ENCOUNTER — Emergency Department (INDEPENDENT_AMBULATORY_CARE_PROVIDER_SITE_OTHER): Payer: No Typology Code available for payment source

## 2015-09-15 ENCOUNTER — Encounter (HOSPITAL_COMMUNITY): Payer: Self-pay | Admitting: *Deleted

## 2015-09-15 ENCOUNTER — Emergency Department (HOSPITAL_COMMUNITY)
Admission: EM | Admit: 2015-09-15 | Discharge: 2015-09-15 | Disposition: A | Discharge status: Home / Self Care | Payer: No Typology Code available for payment source | Source: Home / Self Care | Attending: Family Medicine | Admitting: Family Medicine

## 2015-09-15 DIAGNOSIS — J4521 Mild intermittent asthma with (acute) exacerbation: Secondary | ICD-10-CM

## 2015-09-15 MED ORDER — ALBUTEROL SULFATE (2.5 MG/3ML) 0.083% IN NEBU
5.0000 mg | INHALATION_SOLUTION | Freq: Once | RESPIRATORY_TRACT | Status: AC
  Administered 2015-09-15: 5 mg via RESPIRATORY_TRACT

## 2015-09-15 MED ORDER — METHYLPREDNISOLONE SODIUM SUCC 125 MG IJ SOLR
125.0000 mg | Freq: Once | INTRAMUSCULAR | Status: AC
  Administered 2015-09-15: 125 mg via INTRAMUSCULAR

## 2015-09-15 MED ORDER — LEVOFLOXACIN 500 MG PO TABS
500.0000 mg | ORAL_TABLET | Freq: Every day | ORAL | Status: DC
Start: 2015-09-15 — End: 2017-07-03

## 2015-09-15 MED ORDER — IPRATROPIUM BROMIDE 0.02 % IN SOLN
RESPIRATORY_TRACT | Status: AC
  Filled 2015-09-15: qty 2.5

## 2015-09-15 MED ORDER — FLUTICASONE-SALMETEROL 250-50 MCG/DOSE IN AEPB: 1.0000 | INHALATION_SPRAY | Freq: Two times a day (BID) | RESPIRATORY_TRACT | Status: DC

## 2015-09-15 MED ORDER — ALBUTEROL SULFATE HFA 108 (90 BASE) MCG/ACT IN AERS: 2.0000 | INHALATION_SPRAY | Freq: Four times a day (QID) | RESPIRATORY_TRACT | Status: DC | PRN

## 2015-09-15 MED ORDER — ALBUTEROL SULFATE HFA 108 (90 BASE) MCG/ACT IN AERS: 1.0000 | INHALATION_SPRAY | Freq: Four times a day (QID) | RESPIRATORY_TRACT | Status: DC | PRN

## 2015-09-15 MED ORDER — METHYLPREDNISOLONE SODIUM SUCC 125 MG IJ SOLR
INTRAMUSCULAR | Status: AC
  Filled 2015-09-15: qty 2

## 2015-09-15 MED ORDER — IPRATROPIUM BROMIDE 0.02 % IN SOLN
0.5000 mg | Freq: Once | RESPIRATORY_TRACT | Status: AC
  Administered 2015-09-15: 0.5 mg via RESPIRATORY_TRACT

## 2015-09-15 MED ORDER — ALBUTEROL SULFATE (2.5 MG/3ML) 0.083% IN NEBU
INHALATION_SOLUTION | RESPIRATORY_TRACT | Status: AC
  Filled 2015-09-15: qty 6

## 2015-09-15 NOTE — ED Notes (Signed)
Pt  Reports         Cough   Congestion     Tightness    In  Chest          Short  Of  Breath      The  Cough  For  The  Most  Part  Is  Non productive  She  Reports    She  Was  Here  sev  Weeks   Ago     And  Had  Breathing  tx  And  Prednisone

## 2015-09-15 NOTE — ED Provider Notes (Signed)
CSN: [id number]     Arrival date & time 09/15/15  1344 History   First MD Initiated Contact with Patient 09/15/15 1510     Chief Complaint  Patient presents with  . Cough   (Consider location/radiation/quality/duration/timing/severity/associated sxs/prior Treatment) Patient is a 45 y.o. female presenting with cough. The history is provided by the patient.  Cough Cough characteristics:  Non-productive, dry, harsh and hacking Severity:  Moderate Onset quality:  Gradual Duration:  18 days Progression:  Unchanged Chronicity:  New (pt seen here on 3/6 and in f/u by her pcp with further care but sx continue .) Smoker: yes   Context: smoke exposure   Associated symptoms: rhinorrhea, shortness of breath and wheezing   Associated symptoms: no chills, no fever and no sore throat     Past Medical History  Diagnosis Date  . High blood pressure   . Depression   . Anxiety   . Asthma     seasonal related/ albuterol used when uri  . H/O dizziness     Hx  . Arthritis     knee, hands  . Anemia     iv iron treatment dec 2014/ see oncology for this last in dec 2014  . SVD (spontaneous vaginal delivery)     x 3  . Headache(784.0)     otc med prn   Past Surgical History  Procedure Laterality Date  . Gallbladder surgery  04/04/2010  . Tubal ligation    . Cholecystectomy  10/11    lap choli  . Carpal tunnel release  08/30/2011    Procedure: CARPAL TUNNEL RELEASE;  Surgeon: Wynonia Sours, MD;  Location: [facility];  Service: Orthopedics;  Laterality: Right;  . Intrauterine device insertion      07/19/2013  . Tonsillectomy and adenoidectomy Bilateral 09/08/2013    Procedure: TONSILLECTOMY ;  Surgeon: Ruby Cola, MD;  Location: [facility];  Service: ENT;  Laterality: Bilateral;  . Septoplasty N/A 09/08/2013    Procedure: SEPTOPLASTY;  Surgeon: Ruby Cola, MD;  Location: [facility];  Service: ENT;  Laterality: N/A;  . Hysteroscopy with novasure N/A 02/08/2014    Procedure: ATTEMPTED  NOVASURE ABLATION;  Surgeon: Woodroe Mode, MD;  Location: [facility];  Service: Gynecology;  Laterality: N/A;  . Hysteroscopy N/A 02/08/2014    Procedure: HYSTEROSCOPY;  Surgeon: Woodroe Mode, MD;  Location: [facility];  Service: Gynecology;  Laterality: N/A;  . Wisdom tooth extraction    . Vaginal hysterectomy N/A 04/12/2014    Procedure: LAPAROSCOPIC ASSISTED VAGINAL HYSTERECTOMY;  Surgeon: Woodroe Mode, MD;  Location: [facility];  Service: Gynecology;  Laterality: N/A;  . Bilateral salpingectomy Bilateral 04/12/2014    Procedure: BILATERAL SALPINGECTOMY;  Surgeon: Woodroe Mode, MD;  Location: [facility];  Service: Gynecology;  Laterality: Bilateral;   Family History  Problem Relation Age of Onset  . Heart disease    . Diabetes    . Hypertension    . Alcohol abuse    . Diabetes Mother   . Hypertension Father    Social History  Substance Use Topics  . Smoking status: Former Smoker -- 0.25 packs/day    Types: Cigarettes    Quit date: 03/10/2015  . Smokeless tobacco: Never Used  . Alcohol Use: No   OB History    Gravida Para Term Preterm AB TAB SAB Ectopic Multiple Living   3 3 3  0 0 0 0 0 0 3     Review of Systems  Constitutional: Negative.  Negative for fever and chills.  HENT: Positive for congestion and rhinorrhea. Negative for sore throat.   Respiratory: Positive for cough, shortness of breath and wheezing.   Cardiovascular: Negative.   Gastrointestinal: Negative.   Genitourinary: Negative.   All other systems reviewed and are negative.   Allergies  Review of patient's allergies indicates no known allergies.  Home Medications   Prior to Admission medications   Medication Sig Start Date End Date Taking? Authorizing Provider  albuterol (PROVENTIL HFA;VENTOLIN HFA) 108 (90 BASE) MCG/ACT inhaler Inhale 2 puffs into the lungs every 6 (six) hours as needed for wheezing or shortness of breath.     Historical Provider, MD  Aspirin Effervescent (ALKA-SELTZER PO) Take 2 tablets by  mouth daily as needed (cold symptoms).    Historical Provider, MD  azithromycin (ZITHROMAX Z-PAK) 250 MG tablet Take 1 tablet (250 mg total) by mouth daily. 08/28/15   Konrad Felix, PA  azithromycin (ZITHROMAX) 250 MG tablet Take 1 tablet (250 mg total) by mouth daily. Take first 2 tablets together, then 1 every day until finished. 02/01/15   Charlann Lange, PA-C  beclomethasone (QVAR) 40 MCG/ACT inhaler Inhale 2 puffs into the lungs 2 (two) times daily.    Historical Provider, MD  citalopram (CELEXA) 20 MG tablet Take 20 mg by mouth daily.    Historical Provider, MD  doxepin (SINEQUAN) 25 MG capsule Take 25 mg by mouth at bedtime.    Historical Provider, MD  ibuprofen (ADVIL,MOTRIN) 800 MG tablet Take 800 mg by mouth every 8 (eight) hours as needed for moderate pain.    Historical Provider, MD  lisinopril (PRINIVIL,ZESTRIL) 20 MG tablet Take 20 mg by mouth daily.    Historical Provider, MD  loratadine-pseudoephedrine (CLARITIN-D 24-HOUR) 10-240 MG per 24 hr tablet Take 1 tablet by mouth daily. 04/21/13   Historical Provider, MD  predniSONE (DELTASONE) 10 MG tablet Take 2 tablets (20 mg total) by mouth daily. 02/01/15   Charlann Lange, PA-C  predniSONE (DELTASONE) 10 MG tablet Sig: 4 tables once a day for 3 days, 3 tablets once a day X3 days, 2 tablets a day for 3 days, 1 tablet a day for 3 days. 08/28/15   Konrad Felix, PA   Meds Ordered and Administered this Visit  Medications - No data to display  BP 150/100 mmHg  Pulse 92  Temp(Src) 97.7 F (36.5 C) (Oral)  SpO2 96%  LMP 02/13/2014 No data found.   Physical Exam  Constitutional: She is oriented to person, place, and time. She appears well-developed and well-nourished. No distress.  HENT:  Right Ear: External ear normal.  Left Ear: External ear normal.  Mouth/Throat: Oropharynx is clear and moist.  Eyes: Pupils are equal, round, and reactive to light.  Neck: Normal range of motion. Neck supple.  Cardiovascular: Regular rhythm, normal  heart sounds and intact distal pulses.   Pulmonary/Chest: Effort normal. No respiratory distress. She has wheezes. She has no rales. She exhibits tenderness.  Lymphadenopathy:    She has no cervical adenopathy.  Neurological: She is alert and oriented to person, place, and time.  Skin: Skin is warm and dry.  Nursing note and vitals reviewed.   ED Course  Procedures (including critical care time)  Labs Review Labs Reviewed - No data to display  Imaging Review No results found.   Visual Acuity Review  Right Eye Distance:   Left Eye Distance:   Bilateral Distance:    Right Eye Near:   Left Eye Near:  Bilateral Near:         MDM  No diagnosis found.  Meds ordered this encounter  Medications  . albuterol (PROVENTIL) (2.5 MG/3ML) 0.083% nebulizer solution 5 mg    Sig:   . ipratropium (ATROVENT) nebulizer solution 0.5 mg    Sig:   . methylPREDNISolone sodium succinate (SOLU-MEDROL) 125 mg/2 mL injection 125 mg    Sig:   . levofloxacin (LEVAQUIN) 500 MG tablet    Sig: Take 1 tablet (500 mg total) by mouth daily.    Dispense:  10 tablet    Refill:  0      Billy Fischer, MD 09/15/15 [phone]

## 2016-03-13 ENCOUNTER — Encounter (HOSPITAL_COMMUNITY): Payer: Self-pay | Admitting: Emergency Medicine

## 2016-03-13 ENCOUNTER — Emergency Department (HOSPITAL_COMMUNITY)
Admission: EM | Admit: 2016-03-13 | Discharge: 2016-03-13 | Disposition: A | Discharge status: Home / Self Care | Payer: Medicaid Other | Attending: Emergency Medicine | Admitting: Emergency Medicine

## 2016-03-13 ENCOUNTER — Emergency Department (HOSPITAL_COMMUNITY): Payer: Medicaid Other

## 2016-03-13 DIAGNOSIS — J069 Acute upper respiratory infection, unspecified: Secondary | ICD-10-CM | POA: Diagnosis not present

## 2016-03-13 DIAGNOSIS — R079 Chest pain, unspecified: Secondary | ICD-10-CM | POA: Diagnosis present

## 2016-03-13 DIAGNOSIS — Z87891 Personal history of nicotine dependence: Secondary | ICD-10-CM | POA: Insufficient documentation

## 2016-03-13 DIAGNOSIS — I10 Essential (primary) hypertension: Secondary | ICD-10-CM | POA: Diagnosis not present

## 2016-03-13 DIAGNOSIS — Z7982 Long term (current) use of aspirin: Secondary | ICD-10-CM | POA: Diagnosis not present

## 2016-03-13 DIAGNOSIS — Z79899 Other long term (current) drug therapy: Secondary | ICD-10-CM | POA: Diagnosis not present

## 2016-03-13 DIAGNOSIS — R0602 Shortness of breath: Secondary | ICD-10-CM | POA: Diagnosis not present

## 2016-03-13 DIAGNOSIS — J45909 Unspecified asthma, uncomplicated: Secondary | ICD-10-CM | POA: Insufficient documentation

## 2016-03-13 LAB — I-STAT TROPONIN, ED: Troponin i, poc: 0 ng/mL (ref 0.00–0.08)

## 2016-03-13 LAB — BASIC METABOLIC PANEL
ANION GAP: 10 (ref 5–15)
BUN: 11 mg/dL (ref 6–20)
CHLORIDE: 101 mmol/L (ref 101–111)
CO2: 26 mmol/L (ref 22–32)
Calcium: 9.4 mg/dL (ref 8.9–10.3)
Creatinine, Ser: 0.83 mg/dL (ref 0.44–1.00)
GFR calc non Af Amer: >60 mL/min (ref >60)
GLUCOSE: 101 mg/dL — AB (ref 65–99)
Potassium: 4.2 mmol/L (ref 3.5–5.1)
Sodium: 137 mmol/L (ref 135–145)

## 2016-03-13 LAB — CBC
HCT: 45.4 % (ref 36.0–46.0)
HEMOGLOBIN: 14.9 g/dL (ref 12.0–15.0)
MCH: 24.8 pg — AB (ref 26.0–34.0)
MCHC: 32.8 g/dL (ref 30.0–36.0)
MCV: 75.5 fL — AB (ref 78.0–100.0)
Platelets: 368 10*3/uL (ref 150–400)
RBC: 6.01 MIL/uL — AB (ref 3.87–5.11)
RDW: 17.1 % — ABNORMAL HIGH (ref 11.5–15.5)
WBC: 9.8 10*3/uL (ref 4.0–10.5)

## 2016-03-13 LAB — BRAIN NATRIURETIC PEPTIDE: B Natriuretic Peptide: 24.8 pg/mL (ref 0.0–100.0)

## 2016-03-13 NOTE — Discharge Instructions (Signed)
It is recommended to wear compression stockings to help with the swelling of your legs.

## 2016-03-13 NOTE — ED Provider Notes (Signed)
[facility] DEPT Provider Note   CSN: [id number] Arrival date & time: 03/13/16  1752     History   Chief Complaint Chief Complaint  Patient presents with  . Chest Pain  . Cough  . Shortness of Breath    HPI Cynthia Becker is a 45 y.o. female.  Patient presents today with a complaints of chest pain, cough, and SOB.  She states that her symptoms have been present for the past month.  Symptoms gradually worsening.  She states that she also has LE edema bilaterally for the past month.  LE edema worsens as the day progresses and when she spends more time on her feet.  Edema improves with elevating her legs.  She states that the pain in her chest has been constant over the past 2 weeks and worsens with coughing.  Pain does not radiate.  She denies hemoptysis, diaphoresis, fever, chills, nausea, vomiting, dizziness, or syncope.  She denies history of CHF.  Denies cardiac history.  Denies history of PE or DVT.  Denies prolonged travel or surgeries in the past 4 weeks.  Denies exogenous estrogen use.        Past Medical History:  Diagnosis Date  . Anemia    iv iron treatment dec 2014/ see oncology for this last in dec 2014  . Anxiety   . Arthritis    knee, hands  . Asthma    seasonal related/ albuterol used when uri  . Depression   . H/O dizziness    Hx  . Headache(784.0)    otc med prn  . High blood pressure   . SVD (spontaneous vaginal delivery)    x 3    Patient Active Problem List   Diagnosis Date Noted  . Morbid obesity with BMI of 60.0-69.9, adult ([place]) 09/14/2014  . Iron deficiency anemia due to chronic blood loss 09/14/2014  . CN (constipation) 09/14/2014  . Postoperative state 04/12/2014  . Uterine fibroid 03/17/2014  . DUB (dysfunctional uterine bleeding) 01/27/2014  . Iron deficiency anemia due to chronic blood loss 04/09/2013  . Hemoglobin C trait ([place]) 04/09/2013  . Menometrorrhagia 04/09/2013  . Left hand pain 09/18/2011  . High blood pressure   .  ANEMIA, IRON DEFICIENCY 08/06/2010  . CHOLECYSTECTOMY, HX OF 04/09/2010  . HYPERLIPIDEMIA 02/13/2010  . DEGENERATIVE DISC DISEASE, LUMBOSACRAL SPINE W/RADICULOPATHY 11/01/2009  . BACK PAIN, LUMBAR, WITH RADICULOPATHY 10/30/2009  . TRIGGER FINGER 02/03/2009  . CARPAL TUNNEL SYNDROME 12/21/2008  . HEADACHE 11/09/2008  . DEPRESSION 10/04/2008  . URI 08/24/2008  . INSOMNIA 08/24/2008  . ASTHMA 06/06/2008  . OBESITY 05/24/2008  . ESSENTIAL HYPERTENSION 05/24/2008    Past Surgical History:  Procedure Laterality Date  . BILATERAL SALPINGECTOMY Bilateral 04/12/2014   Procedure: BILATERAL SALPINGECTOMY;  Surgeon: Woodroe Mode, MD;  Location: [facility];  Service: Gynecology;  Laterality: Bilateral;  . CARPAL TUNNEL RELEASE  08/30/2011   Procedure: CARPAL TUNNEL RELEASE;  Surgeon: Wynonia Sours, MD;  Location: [facility];  Service: Orthopedics;  Laterality: Right;  . CHOLECYSTECTOMY  10/11   lap choli  . GALLBLADDER SURGERY  04/04/2010  . HYSTEROSCOPY N/A 02/08/2014   Procedure: HYSTEROSCOPY;  Surgeon: Woodroe Mode, MD;  Location: [facility];  Service: Gynecology;  Laterality: N/A;  . HYSTEROSCOPY WITH NOVASURE N/A 02/08/2014   Procedure: ATTEMPTED NOVASURE ABLATION;  Surgeon: Woodroe Mode, MD;  Location: [facility];  Service: Gynecology;  Laterality: N/A;  . INTRAUTERINE DEVICE INSERTION     07/19/2013  . SEPTOPLASTY  N/A 09/08/2013   Procedure: SEPTOPLASTY;  Surgeon: Ruby Cola, MD;  Location: [facility];  Service: ENT;  Laterality: N/A;  . TONSILLECTOMY AND ADENOIDECTOMY Bilateral 09/08/2013   Procedure: TONSILLECTOMY ;  Surgeon: Ruby Cola, MD;  Location: [facility];  Service: ENT;  Laterality: Bilateral;  . TUBAL LIGATION    . VAGINAL HYSTERECTOMY N/A 04/12/2014   Procedure: LAPAROSCOPIC ASSISTED VAGINAL HYSTERECTOMY;  Surgeon: Woodroe Mode, MD;  Location: [facility];  Service: Gynecology;  Laterality: N/A;  . WISDOM TOOTH EXTRACTION      OB History    Gravida Para Term Preterm AB  Living   3 3 3  0 0 3   SAB TAB Ectopic Multiple Live Births   0 0 0 0         Home Medications    Prior to Admission medications   Medication Sig Start Date End Date Taking? Authorizing Provider  albuterol (PROVENTIL HFA;VENTOLIN HFA) 108 (90 Base) MCG/ACT inhaler Inhale 1-2 puffs into the lungs every 6 (six) hours as needed for wheezing or shortness of breath. 09/15/15   Billy Fischer, MD  albuterol (PROVENTIL HFA;VENTOLIN HFA) 108 (90 Base) MCG/ACT inhaler Inhale 2 puffs into the lungs every 6 (six) hours as needed for wheezing or shortness of breath. 09/15/15   Billy Fischer, MD  Aspirin Effervescent (ALKA-SELTZER PO) Take 2 tablets by mouth daily as needed (cold symptoms).    Historical Provider, MD  azithromycin (ZITHROMAX Z-PAK) 250 MG tablet Take 1 tablet (250 mg total) by mouth daily. 08/28/15   Konrad Felix, PA  azithromycin (ZITHROMAX) 250 MG tablet Take 1 tablet (250 mg total) by mouth daily. Take first 2 tablets together, then 1 every day until finished. 02/01/15   Charlann Lange, PA-C  beclomethasone (QVAR) 40 MCG/ACT inhaler Inhale 2 puffs into the lungs 2 (two) times daily.    Historical Provider, MD  citalopram (CELEXA) 20 MG tablet Take 20 mg by mouth daily.    Historical Provider, MD  doxepin (SINEQUAN) 25 MG capsule Take 25 mg by mouth at bedtime.    Historical Provider, MD  Fluticasone-Salmeterol (ADVAIR) 250-50 MCG/DOSE AEPB Inhale 1 puff into the lungs 2 (two) times daily. 09/15/15   Billy Fischer, MD  ibuprofen (ADVIL,MOTRIN) 800 MG tablet Take 800 mg by mouth every 8 (eight) hours as needed for moderate pain.    Historical Provider, MD  levofloxacin (LEVAQUIN) 500 MG tablet Take 1 tablet (500 mg total) by mouth daily. 09/15/15   Billy Fischer, MD  lisinopril (PRINIVIL,ZESTRIL) 20 MG tablet Take 20 mg by mouth daily.    Historical Provider, MD  loratadine-pseudoephedrine (CLARITIN-D 24-HOUR) 10-240 MG per 24 hr tablet Take 1 tablet by mouth daily. 04/21/13   Historical  Provider, MD  predniSONE (DELTASONE) 10 MG tablet Take 2 tablets (20 mg total) by mouth daily. 02/01/15   Charlann Lange, PA-C  predniSONE (DELTASONE) 10 MG tablet Sig: 4 tables once a day for 3 days, 3 tablets once a day X3 days, 2 tablets a day for 3 days, 1 tablet a day for 3 days. 08/28/15   Konrad Felix, PA    Family History Family History  Problem Relation Age of Onset  . Diabetes Mother   . Hypertension Father   . Heart disease    . Diabetes    . Hypertension    . Alcohol abuse      Social History Social History  Substance Use Topics  . Smoking status: Former Smoker  Packs/day: 0.25    Types: Cigarettes    Quit date: 02/11/2016  . Smokeless tobacco: Never Used  . Alcohol use No     Allergies   Review of patient's allergies indicates no known allergies.   Review of Systems Review of Systems  All other systems reviewed and are negative.    Physical Exam Updated Vital Signs BP 112/64 (BP Location: Left Wrist)   Pulse 86   Temp 98.5 F (36.9 C) (Oral)   Resp 17   LMP 02/13/2014   SpO2 98%   Physical Exam  Constitutional: She appears well-developed and well-nourished.  Morbidly obese  HENT:  Head: Normocephalic and atraumatic.  Mouth/Throat: Oropharynx is clear and moist.  Neck: Normal range of motion. Neck supple.  Cardiovascular: Normal rate, regular rhythm and normal heart sounds.   Pulmonary/Chest: Effort normal and breath sounds normal. No respiratory distress. She has no wheezes. She has no rales.  Musculoskeletal: Normal range of motion.  2+ pitting edema from mid tibia distally, edema is symmetric.  LLE edema=RLE edema.  No LE erythema or warmth  Neurological: She is alert.  Skin: Skin is warm and dry.  Psychiatric: She has a normal mood and affect.  Nursing note and vitals reviewed.    ED Treatments / Results  Labs (all labs ordered are listed, but only abnormal results are displayed) Labs Reviewed  [place], ED    EKG  EKG Interpretation  Date/Time:  Wednesday March 13 2016 17:53:04 EDT Ventricular Rate:  84 PR Interval:    QRS Duration: 83 QT Interval:  376 QTC Calculation: 445 R Axis:   73 Text Interpretation:  Sinus rhythm Borderline prolonged PR interval No significant change since last tracing Confirmed by Canary Brim  MD, MARTHA [phone]) on 03/13/2016 7:23:34 PM       Radiology No results found.  Procedures Procedures (including critical care time)  Medications Ordered in ED Medications - No data to display   Initial Impression / Assessment and Plan / ED Course  I have reviewed the triage vital signs and the nursing notes.  Pertinent labs & imaging results that were available during my care of the patient were reviewed by me and considered in my medical decision making (see chart for details).  Clinical Course    Final Clinical Impressions(s) / ED Diagnoses   Final diagnoses:  None   Patient who is morbidly obese presents today with complaints of SOB, LE edema, and CP for the past month.  She states that her LE edema improves with elevation of the legs.  Patient encouraged to use compression stockings.  BNP is 24.  No ischemic changes on EKG.  Troponin is negative.  HEART score of 2, putting her at low risk.  She is PERC negative.  CXR is negative.  Feel that the patient is stable for discharge.  Return precautions given.   New Prescriptions New Prescriptions   No medications on file     Hyman Bible, PA-C 03/16/16 [place], MD 03/19/16 [phone]

## 2016-03-13 NOTE — ED Triage Notes (Addendum)
Report from [facility]> C/o increased dyspnea x 2 months.  Reports productive cough with thick, yellow phlegm, congestion, and intermittent pain across chest x 1 month.  States coughing worse at night and with exertion.  2+ pitting edema to bilateral lower extremities.  Pt taking HCTZ as instructed x 2 months.

## 2016-09-26 ENCOUNTER — Telehealth: Payer: Self-pay | Admitting: Physical Therapy

## 2016-09-26 ENCOUNTER — Ambulatory Visit: Payer: Medicaid Other | Attending: Primary Care | Admitting: Physical Therapy

## 2016-09-26 DIAGNOSIS — M6281 Muscle weakness (generalized): Secondary | ICD-10-CM | POA: Diagnosis present

## 2016-09-26 DIAGNOSIS — G8929 Other chronic pain: Secondary | ICD-10-CM | POA: Insufficient documentation

## 2016-09-26 DIAGNOSIS — R262 Difficulty in walking, not elsewhere classified: Secondary | ICD-10-CM

## 2016-09-26 DIAGNOSIS — M5441 Lumbago with sciatica, right side: Secondary | ICD-10-CM | POA: Diagnosis present

## 2016-09-26 DIAGNOSIS — M5442 Lumbago with sciatica, left side: Secondary | ICD-10-CM | POA: Diagnosis present

## 2016-09-26 NOTE — Therapy (Signed)
Cynthia Becker [address] [place], [place], [address] Phone: [phone]   Fax:  [phone]  Physical Therapy Evaluation  Patient Details  Name: Cynthia Becker MRN: [id number] Date of Birth: 1970/09/17 Referring Provider: Kerin Perna, NP  Encounter Date: 09/26/2016      PT End of Session - 09/26/16 1854    Visit Number 1   Number of Visits 4  eval + 3 treatment   Date for PT Re-Evaluation 10/25/16   Authorization Type Medicaid authorization pending   PT Start Time 1537   PT Stop Time 1628   PT Time Calculation (min) 51 min   Activity Tolerance No increased pain   Behavior During Therapy [facility] for tasks assessed/performed      Past Medical History:  Diagnosis Date  . Anemia    iv iron treatment dec 2014/ see oncology for this last in dec 2014  . Anxiety   . Arthritis    knee, hands  . Asthma    seasonal related/ albuterol used when uri  . Depression   . H/O dizziness    Hx  . Headache(784.0)    otc med prn  . High blood pressure   . SVD (spontaneous vaginal delivery)    x 3    Past Surgical History:  Procedure Laterality Date  . BILATERAL SALPINGECTOMY Bilateral 04/12/2014   Procedure: BILATERAL SALPINGECTOMY;  Surgeon: Woodroe Mode, MD;  Location: [facility];  Service: Gynecology;  Laterality: Bilateral;  . CARPAL TUNNEL RELEASE  08/30/2011   Procedure: CARPAL TUNNEL RELEASE;  Surgeon: Wynonia Sours, MD;  Location: [facility];  Service: Orthopedics;  Laterality: Right;  . CHOLECYSTECTOMY  10/11   lap choli  . GALLBLADDER SURGERY  04/04/2010  . HYSTEROSCOPY N/A 02/08/2014   Procedure: HYSTEROSCOPY;  Surgeon: Woodroe Mode, MD;  Location: [facility];  Service: Gynecology;  Laterality: N/A;  . HYSTEROSCOPY WITH NOVASURE N/A 02/08/2014   Procedure: ATTEMPTED NOVASURE ABLATION;  Surgeon: Woodroe Mode, MD;  Location: [facility];  Service: Gynecology;  Laterality: N/A;  . INTRAUTERINE DEVICE  INSERTION     07/19/2013  . SEPTOPLASTY N/A 09/08/2013   Procedure: SEPTOPLASTY;  Surgeon: Ruby Cola, MD;  Location: [facility];  Service: ENT;  Laterality: N/A;  . TONSILLECTOMY AND ADENOIDECTOMY Bilateral 09/08/2013   Procedure: TONSILLECTOMY ;  Surgeon: Ruby Cola, MD;  Location: [facility];  Service: ENT;  Laterality: Bilateral;  . TUBAL LIGATION    . VAGINAL HYSTERECTOMY N/A 04/12/2014   Procedure: LAPAROSCOPIC ASSISTED VAGINAL HYSTERECTOMY;  Surgeon: Woodroe Mode, MD;  Location: [facility];  Service: Gynecology;  Laterality: N/A;  . WISDOM TOOTH EXTRACTION      There were no vitals filed for this visit.       Subjective Assessment - 09/26/16 1556    Subjective Patient presents stating she can hardly walk anymore due to severe back pain with burning pain down posterior thighs. She thinks she needs a wheelchair so she can be more mobile, be able to be more independent, and get out in the community more. She has been trying to lose weight, is working with a dietician, and admits to feeling very isolated due to her physical limitations.    Pertinent History morbid obesity (~430 lbs, BMI > 60 per MD notes), HTN,    Limitations Sitting;Standing;Walking;House hold activities   How long can you sit comfortably? depends, sometimes legs start hurting/burning and then go numb after ~5 minutes; sometimes can sit for  much longer   How long can you stand comfortably? less than 2 minutes   How long can you walk comfortably? less than 2 minutes   Patient Stated Goals Wants to be more mobile and more independent so she can cook, go out to the store, "feel like I'm contributing to the family"   Currently in Pain? Yes   Pain Score 5   sitting at rest, no pain; with walking 5   Pain Location Back   Pain Orientation Lower   Pain Descriptors / Indicators Aching;Burning;Grimacing;Radiating;Sharp   Pain Type Chronic pain  Has been getting worse since 03/2014 surgery   Pain Radiating Towards buttocks and  posterior thighs; sometimes goes below knee to foot on the rt   Pain Onset More than a month ago   Pain Frequency Intermittent   Aggravating Factors  standing, walking, lying too flat   Pain Relieving Factors sitting (sometimes); ibuprofen (does not help much)   Effect of Pain on Daily Activities Dependent for cooking, household chores, shopping   Multiple Pain Sites Yes   Pain Score 3   Pain Location Leg   Pain Orientation Left;Right;Posterior;Upper   Pain Descriptors / Indicators Burning   Pain Type Chronic pain;Neuropathic pain   Pain Radiating Towards bil buttocks and posterior thighs, sometimes to rt foot   Pain Onset More than a month ago   Pain Frequency Intermittent   Aggravating Factors  sitting, standing, walking   Pain Relieving Factors changing positions   Effect of Pain on Daily Activities can no longer perform household chores, including cooking            [facility] PT Assessment - 09/26/16 0001      Assessment   Medical Diagnosis gait abnormality due to lumbosacral pain with radiculopathy   Referring Provider Kerin Perna, NP   Onset Date/Surgical Date 02/22/14   Next MD Visit not scheduled   Prior Therapy none recent; 10+ years ago OPPT for low back pain     Precautions   Precautions Fall   Precaution Comments states when she has stabbing pains, it feels like her legs will give out     Restrictions   Weight Bearing Restrictions No   Other Position/Activity Restrictions lying supine feels uncomfortable due to her weight     Balance Screen   Has the patient fallen in the past 6 months Yes   How many times? 1  3 mos; off the bed; near falls   Has the patient had a decrease in activity level because of a fear of falling?  Yes   Is the patient reluctant to leave their home because of a fear of falling?  Yes     Home Environment   Living Environment Private residence   Living Arrangements Other relatives;Spouse/significant other;Children  2 sons,  grandson   Available Help at Discharge Family;Available PRN/intermittently   Type of Home House   Home Access Stairs to enter   Entrance Stairs-Number of Steps 1   Entrance Stairs-Rails None   Home Layout Two level  1 large step from main house to den and then door out   Alternate Level Stairs-Number of Steps 1   Alternate Level Stairs-Rails None  holds onto sliding glass door   Home Equipment None     Prior Function   Level of Independence Independent with household mobility without device;Needs assistance with homemaking     Cognition   Overall Cognitive Status Within Functional Limits for tasks assessed   Behaviors  Other (comment)  nearly tearful when talking about how isolated she's become     Observation/Other Assessments   Observations morbidly obese woman wearing bedroom slippers due to LE edema; grimacing as walking from lobby to exam room   Focus on Therapeutic Outcomes (FOTO)  FS 16 (adjusted 47)   Activities of Balance Confidence Scale (ABC Scale)  2.5%   Fear Avoidance Belief Questionnaire (FABQ)  55     Observation/Other Assessments-Edema    Edema Circumferential  bil LEs, symmetrical     Sensation   Light Touch Impaired by gross assessment   Additional Comments reports incontinent of urine x1 ~2 weeks ago "I stood up and realized I had peed on myself"; reports numbness bil LEs, rt worse     Coordination   Gross Motor Movements are Fluid and Coordinated Not tested  difficult to assess due to morbid obesity   Heel Shin Test difficult to assess due to morbid obesity     ROM / Strength   AROM / PROM / Strength AROM;Strength     AROM   Overall AROM  Deficits;Due to pain   Overall AROM Comments limited bil knee flexion due to body habitus (tissue opposition)     Strength   Overall Strength Deficits;Due to pain   Overall Strength Comments able to stand from 18" surface without use of UEs     Bed Mobility   Bed Mobility Supine to Sit;Sit to Supine   Supine  to Sit 6: Modified independent (Device/Increase time);HOB elevated  used wedge + 3 pillows   Sit to Supine 6: Modified independent (Device/Increase time);HOB elevated     Transfers   Transfers Sit to Stand;Stand to Sit   Sit to Stand 6: Modified independent (Device/Increase time);Without upper extremity assist   Stand to Sit 6: Modified independent (Device/Increase time);Without upper extremity assist     Ambulation/Gait   Ambulation/Gait Yes   Ambulation/Gait Assistance 6: Modified independent (Device/Increase time)   Ambulation Distance (Feet) 55 Feet  15, 55   Assistive device None   Gait Pattern Step-through pattern;Decreased arm swing - right;Decreased arm swing - left;Decreased step length - right;Decreased step length - left;Decreased hip/knee flexion - right;Decreased hip/knee flexion - left;Right foot flat;Left foot flat;Shuffle;Antalgic;Decreased trunk rotation;Wide base of support   Ambulation Surface Level;Indoor                           PT Education - 09/26/16 1848    Education provided Yes   Education Details Eval results; how lumbar radiculopathy can progress to bowel/bladder dysfunction (and need to call NP/MD to discuss her bladder incontinence--agreed to PT communicating with NP/MD re: this); goals of short course of PT; HEP (see pt instructions); pro's/con's of use of wheelchair and do not yet recommend a w/c   Person(s) Educated Patient   Methods Explanation;Demonstration;Verbal cues;Handout;Tactile cues   Comprehension Verbalized understanding;Returned demonstration;Need further instruction          PT Short Term Goals - 09/26/16 1935      PT SHORT TERM GOAL #1   Title See LTGs           PT Long Term Goals - 09/26/16 1935      PT LONG TERM GOAL #1   Title Patient will demonstrate independence with HEP for strengthening core and legs and stretching low back and legs TARGET 10/25/16   Baseline No current exercise program   Time 4    Period Weeks  Status New     PT LONG TERM GOAL #2   Title Patient will tolerate ambulating 200 ft with least restrictive assistive device with back/leg pain <5/10.    Baseline 55 ft with pain 5/10   Time 4   Period Weeks   Status New     PT LONG TERM GOAL #3   Title Patient will tolerate standing and short distance ambulation (as used when cooking) x 5 minutes with back pain <5/10   Baseline Can tolerate standing < 2 minutes with pain 5/10   Time 4   Period Weeks   Status New     PT LONG TERM GOAL #4   Title If patient unable to tolerate ambulation per goal #2, PT to reassess appropriateness for wheelchair and work with vendor to procure.   Baseline currently no DME/wheelchair   Time 4   Period Weeks   Status New               Plan - 09/26/16 1857    Clinical Impression Statement 46 yo morbidly obese female presents to PT for evaluation due to gait abnormality due to lumbosacral pain with radiculopathy (G 54.4). She describes a vicious cycle of pain, inactivity, weakness that began in Sept 2015. She has gradually declined and presented today requesting a wheelchair due to her inability to tolerate ambulation due to pain and near falls (fear of falling). She admits to feeling very isolated from family and community due to pain and limited ambulation. She exhibits greater than 3 co-morbidities (see PMHx in EMR), 3 body systems involved and with an evolving presentation. Clinical reasoning for this evaluation was of moderate complexity. There is a fair chance that patient's pain and radiculopathy can be improved through the limited course of OPPT she will be allowed by her insurance.    Rehab Potential Fair  radiculopathy has been progressive for over 3 years   Clinical Impairments Affecting Rehab Potential pain due to radiculopathy; morbid obesity limiting positions pt can achieve or maintain for stretching and strengthening   PT Frequency 1x / week   PT Duration 3 weeks   PT  Treatment/Interventions ADLs/Self Care Home Management;Electrical Stimulation;Moist Heat;Ultrasound;DME Instruction;Gait training;Stair training;Functional mobility training;Therapeutic activities;Therapeutic exercise;Balance training;Neuromuscular re-education;Patient/family education;Wheelchair mobility training;Passive range of motion   PT Next Visit Plan check if pt followed up with MD re: bladder incontinence; assess tolerance (& understanding) of HEP from 4/5; if she has not added lower abd ex's, hamstring stretch, or bridging try these; assess if RW (bariatric) helps relieve pain   PT Home Exercise Plan supine positioning with pillows for closed pack position/comfort; pelvic tilts; knee to chest with spouse assist;    Consulted and Agree with Plan of Care Patient      Patient will benefit from skilled therapeutic intervention in order to improve the following deficits and impairments:  Abnormal gait, Decreased activity tolerance, Decreased knowledge of use of DME, Decreased mobility, Decreased range of motion, Difficulty walking, Impaired sensation  Visit Diagnosis: Difficulty in walking, not elsewhere classified - Plan: PT plan of care cert/re-cert  Chronic midline low back pain with left-sided sciatica - Plan: PT plan of care cert/re-cert  Chronic midline low back pain with right-sided sciatica - Plan: PT plan of care cert/re-cert  Muscle weakness (generalized) - Plan: PT plan of care cert/re-cert     Problem List Patient Active Problem List   Diagnosis Date Noted  . Morbid obesity with BMI of 60.0-69.9, adult ([place]) 09/14/2014  . Iron deficiency  anemia due to chronic blood loss 09/14/2014  . CN (constipation) 09/14/2014  . Postoperative state 04/12/2014  . Uterine fibroid 03/17/2014  . DUB (dysfunctional uterine bleeding) 01/27/2014  . Iron deficiency anemia due to chronic blood loss 04/09/2013  . 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 Menometrorrhagia 04/09/2013  . Left  hand pain 09/18/2011  . High blood pressure   . ANEMIA, IRON DEFICIENCY 08/06/2010  . CHOLECYSTECTOMY, HX OF 04/09/2010  . HYPERLIPIDEMIA 02/13/2010  . DEGENERATIVE DISC DISEASE, LUMBOSACRAL SPINE W/RADICULOPATHY 11/01/2009  . BACK PAIN, LUMBAR, WITH RADICULOPATHY 10/30/2009  . TRIGGER FINGER 02/03/2009  . CARPAL TUNNEL SYNDROME 12/21/2008  . HEADACHE 11/09/2008  . DEPRESSION 10/04/2008  . URI 08/24/2008  . INSOMNIA 08/24/2008  . ASTHMA 06/06/2008  . OBESITY 05/24/2008  . ESSENTIAL HYPERTENSION 05/24/2008    Rexanne Mano, PT 09/26/2016, 7:56 PM  [facility] [address] [place], [place], [address] Phone: [phone]   Fax:  [phone]  Name: Cynthia Becker MRN: [id number] Date of Birth: October 17, 1970

## 2016-09-26 NOTE — Patient Instructions (Signed)
Pelvic Tilt    Flatten back by tightening stomach muscles and buttocks. Repeat 5 times per set. Do 2 sets per session. Do 2 sessions per day. Increase to 10 times twice per day if burning pain does not increase  http://orth.exer.us/134   Copyright  VHI. All rights reserved.  Knee to Chest (Flexion)    Pull knee toward chest (have husband assist). Feel stretch in lower back or buttock area. Breathing deeply, Hold 30 seconds. Repeat with other knee. Repeat 2 times. Do 2 sessions per day.  http://gt2.exer.us/225   Copyright  VHI. All rights reserved.              Copyright  VHI. All rights reserved.  Lumbar Rotation: Caudal - Bilateral (Supine)    Feet and knees together, arms outstretched, rotate knees left, turning head in opposite direction, until stretch is felt. Hold 10-30 seconds. Relax. Repeat 2-3 times per set. Do 1 sets per session. Do 1 sessions per day.  http://orth.exer.us/1020        Chair Sitting    Sit at edge of seat, spine straight, one leg extended. Put a hand on each thigh and bend forward from the hip, keeping spine straight. Allow hand on extended leg to reach toward toes. Support upper body with other arm. Hold 10-30 seconds. Repeat 2 times per session. Do 2 sessions per day.  Copyright  VHI. All rights reserved.    Copyright  VHI. All rights reserved.     Lower Ab Strengthening    1. Lie on back with knees bent. 2. Hold your back flat against the bed/floor as you move your legs 3. Easy:  Spread your knees in and out Mod: Slide one foot forward/back      4. Repeat 5 times

## 2016-09-26 NOTE — Telephone Encounter (Signed)
Telephone encounter opened in error.

## 2016-10-11 ENCOUNTER — Ambulatory Visit: Payer: Medicaid Other | Admitting: Physical Therapy

## 2016-10-11 ENCOUNTER — Encounter: Payer: Self-pay | Admitting: Physical Therapy

## 2016-10-11 DIAGNOSIS — M6281 Muscle weakness (generalized): Secondary | ICD-10-CM

## 2016-10-11 DIAGNOSIS — R262 Difficulty in walking, not elsewhere classified: Secondary | ICD-10-CM

## 2016-10-11 DIAGNOSIS — M5442 Lumbago with sciatica, left side: Secondary | ICD-10-CM

## 2016-10-11 DIAGNOSIS — M5441 Lumbago with sciatica, right side: Secondary | ICD-10-CM

## 2016-10-11 DIAGNOSIS — G8929 Other chronic pain: Secondary | ICD-10-CM

## 2016-10-11 NOTE — Therapy (Signed)
[facility] [address] [place], [place], [address] Phone: [phone]   Fax:  [phone]  Physical Therapy Treatment  Patient Details  Name: Cynthia Becker MRN: [id number] Date of Birth: 28-Dec-1970 Referring Provider: Kerin Perna, NP  Encounter Date: 10/11/2016      PT End of Session - 10/11/16 2012    Visit Number 2   Number of Visits 4  eval + 3 treatment   Date for PT Re-Evaluation 10/25/16   Authorization Type Medicaid     Authorization Time Period 3 visits 4-18- to 5-8   Authorization - Visit Number 1   Authorization - Number of Visits 3   PT Start Time 1020   PT Stop Time 1103   PT Time Calculation (min) 43 min   Activity Tolerance Patient limited by pain   Behavior During Therapy [facility] for tasks assessed/performed      Past Medical History:  Diagnosis Date  . Anemia    iv iron treatment dec 2014/ see oncology for this last in dec 2014  . Anxiety   . Arthritis    knee, hands  . Asthma    seasonal related/ albuterol used when uri  . Depression   . H/O dizziness    Hx  . Headache(784.0)    otc med prn  . High blood pressure   . SVD (spontaneous vaginal delivery)    x 3    Past Surgical History:  Procedure Laterality Date  . BILATERAL SALPINGECTOMY Bilateral 04/12/2014   Procedure: BILATERAL SALPINGECTOMY;  Surgeon: Woodroe Mode, MD;  Location: [facility];  Service: Gynecology;  Laterality: Bilateral;  . CARPAL TUNNEL RELEASE  08/30/2011   Procedure: CARPAL TUNNEL RELEASE;  Surgeon: Wynonia Sours, MD;  Location: [facility];  Service: Orthopedics;  Laterality: Right;  . CHOLECYSTECTOMY  10/11   lap choli  . GALLBLADDER SURGERY  04/04/2010  . HYSTEROSCOPY N/A 02/08/2014   Procedure: HYSTEROSCOPY;  Surgeon: Woodroe Mode, MD;  Location: [facility];  Service: Gynecology;  Laterality: N/A;  . HYSTEROSCOPY WITH NOVASURE N/A 02/08/2014   Procedure: ATTEMPTED NOVASURE ABLATION;   Surgeon: Woodroe Mode, MD;  Location: [facility];  Service: Gynecology;  Laterality: N/A;  . INTRAUTERINE DEVICE INSERTION     07/19/2013  . SEPTOPLASTY N/A 09/08/2013   Procedure: SEPTOPLASTY;  Surgeon: Ruby Cola, MD;  Location: [facility];  Service: ENT;  Laterality: N/A;  . TONSILLECTOMY AND ADENOIDECTOMY Bilateral 09/08/2013   Procedure: TONSILLECTOMY ;  Surgeon: Ruby Cola, MD;  Location: [facility];  Service: ENT;  Laterality: Bilateral;  . TUBAL LIGATION    . VAGINAL HYSTERECTOMY N/A 04/12/2014   Procedure: LAPAROSCOPIC ASSISTED VAGINAL HYSTERECTOMY;  Surgeon: Woodroe Mode, MD;  Location: [facility];  Service: Gynecology;  Laterality: N/A;  . WISDOM TOOTH EXTRACTION      There were no vitals filed for this visit.      Subjective Assessment - 10/11/16 1025    Subjective Has tried to do some of the exercises but feels trunk rotation made her worse and stopped. Did do pelvic tilts more and found it made her sore (on top of her pain); stopped for awhile but has done them most days.    Pertinent History morbid obesity (~430 lbs, BMI > 60 per MD notes), HTN,    Limitations Sitting;Standing;Walking;House hold activities   How long can you sit comfortably? depends, sometimes legs start hurting/burning and then go numb after ~5 minutes; sometimes can sit for  much longer   How long can you stand comfortably? less than 2 minutes   How long can you walk comfortably? less than 2 minutes   Patient Stated Goals Wants to be more mobile and more independent so she can cook, go out to the store, "feel like I'm contributing to the family"   Currently in Pain? Yes   Pain Score 5    Pain Location Back  to butt   Pain Orientation Lower;Right   Pain Descriptors / Indicators Aching;Burning   Pain Type Chronic pain   Pain Onset More than a month ago   Pain Frequency Intermittent   Pain Onset More than a month ago                         [facility] Adult PT Treatment/Exercise - 10/11/16 1959       Bed Mobility   Bed Mobility Supine to Sit;Sit to Supine   Supine to Sit 5: Supervision;HOB elevated   Supine to Sit Details (indicate cue type and reason) pt pulls up from supine to long-sitting and then pivots to put feet on the floor; educated to attempt sidelying to sit at home to decr back strain; pt reports she has fallen off the bed in the past when she tried to roll onto her side and then sit. Discussed technicque and encouraged her to attempt with spouse present   Sit to Supine 6: Modified independent (Device/Increase time)     Transfers   Sit to Stand 5: Supervision   Sit to Stand Details (indicate cue type and reason) vc for safe use of RW and safe hand placement     Ambulation/Gait   Ambulation/Gait Assistance 5: Supervision   Ambulation/Gait Assistance Details minguard assistance with bariatric w/c following   Ambulation Distance (Feet) 25 Feet  25, 65   Assistive device Rolling walker  bariatric   Gait Pattern Step-through pattern;Decreased stride length;Right foot flat;Left foot flat;Shuffle;Trunk flexed;Wide base of support   Ambulation Surface Level;Indoor     Wheelchair Mobility   Comments used clinic w/c to move from the lobby to the gym; pt dependent     Exercises   Exercises Lumbar     Lumbar Exercises: Stretches   Lower Trunk Rotation 3 reps;10 seconds;30 seconds   Lower Trunk Rotation Limitations educated to start with small movement of knees to the side, using pillow to support legs when rotating      Lumbar Exercises: Supine   Clam 5 reps  supine, single leg out/in & switched   Heel Slides 5 reps  focus on core tightening   Bent Knee Raise Limitations attempted x 1 with pt reporting back pain too much   Other Supine Lumbar Exercises pelvic tilt x5                PT Education - 10/11/16 2009    Education provided Yes   Education Details reviewed HEP (especially ex's pt has not done); pro's and con's of w/c vs ambulation; potentially  insurance will only cover either w/c or RW, but not both; considerations re: home layout/space for w/c and ability for someone to load/transport it (will be heavy)   Person(s) Educated Patient   Methods Explanation;Demonstration;Verbal cues   Comprehension Verbalized understanding;Returned demonstration          PT Short Term Goals - 09/26/16 1935      PT SHORT TERM GOAL #1   Title See LTGs  PT Long Term Goals - 09/26/16 1935      PT LONG TERM GOAL #1   Title Patient will demonstrate independence with HEP for strengthening core and legs and stretching low back and legs TARGET 10/25/16   Baseline No current exercise program   Time 4   Period Weeks   Status New     PT LONG TERM GOAL #2   Title Patient will tolerate ambulating 200 ft with least restrictive assistive device with back/leg pain <5/10.    Baseline 55 ft with pain 5/10   Time 4   Period Weeks   Status New     PT LONG TERM GOAL #3   Title Patient will tolerate standing and short distance ambulation (as used when cooking) x 5 minutes with back pain <5/10   Baseline Can tolerate standing < 2 minutes with pain 5/10   Time 4   Period Weeks   Status New     PT LONG TERM GOAL #4   Title If patient unable to tolerate ambulation per goal #2, PT to reassess appropriateness for wheelchair and work with vendor to procure.   Baseline currently no DME/wheelchair   Time 4   Period Weeks   Status New               Plan - 10/11/16 2014    Clinical Impression Statement Patient reported initial increased back pain while doing HEP. Portion of session focused on explaining expected incr pain while doing stretches, and to keep pain <8/10. Pain should decrease to baseline level within 30-60 minutes. Reviewed and practiced HEP. Session also included gait training with bariatric RW and education.    Rehab Potential Fair  radiculopathy has been progressive for over 3 years   Clinical Impairments Affecting Rehab  Potential pain due to radiculopathy; morbid obesity limiting positions pt can achieve or maintain for stretching and strengthening   PT Frequency 1x / week   PT Duration 3 weeks   PT Treatment/Interventions ADLs/Self Care Home Management;Electrical Stimulation;Moist Heat;Ultrasound;DME Instruction;Gait training;Stair training;Functional mobility training;Therapeutic activities;Therapeutic exercise;Balance training;Neuromuscular re-education;Patient/family education;Wheelchair mobility training;Passive range of motion   PT Next Visit Plan inform insur will not cover w/c and RW; trial rollator;    PT Home Exercise Plan supine positioning with pillows for closed pack position/comfort; pelvic tilts; knee to chest with spouse assist;    Consulted and Agree with Plan of Care Patient      Patient will benefit from skilled therapeutic intervention in order to improve the following deficits and impairments:  Abnormal gait, Decreased activity tolerance, Decreased knowledge of use of DME, Decreased mobility, Decreased range of motion, Difficulty walking, Impaired sensation  Visit Diagnosis: Difficulty in walking, not elsewhere classified  Chronic midline low back pain with left-sided sciatica  Chronic midline low back pain with right-sided sciatica  Muscle weakness (generalized)     Problem List Patient Active Problem List   Diagnosis Date Noted  . Morbid obesity with BMI of 60.0-69.9, adult ([place]) 09/14/2014  . Iron deficiency anemia due to chronic blood loss 09/14/2014  . CN (constipation) 09/14/2014  . Postoperative state 04/12/2014  . Uterine fibroid 03/17/2014  . DUB (dysfunctional uterine bleeding) 01/27/2014  . Iron deficiency anemia due to chronic blood loss 04/09/2013  . Hemoglobin C trait ([place]) 04/09/2013  . Menometrorrhagia 04/09/2013  . Left hand pain 09/18/2011  . High blood pressure   . ANEMIA, IRON DEFICIENCY 08/06/2010  . CHOLECYSTECTOMY, HX OF 04/09/2010  . HYPERLIPIDEMIA  02/13/2010  . DEGENERATIVE  [place] DISEASE, LUMBOSACRAL SPINE W/RADICULOPATHY 11/01/2009  . BACK PAIN, LUMBAR, WITH RADICULOPATHY 10/30/2009  . TRIGGER FINGER 02/03/2009  . CARPAL TUNNEL SYNDROME 12/21/2008  . HEADACHE 11/09/2008  . DEPRESSION 10/04/2008  . URI 08/24/2008  . INSOMNIA 08/24/2008  . ASTHMA 06/06/2008  . OBESITY 05/24/2008  . ESSENTIAL HYPERTENSION 05/24/2008    Rexanne Mano, PT 10/11/2016, 8:39 PM  [facility] [address] [place], [place], [address] Phone: [phone]   Fax:  [phone]  Name: Cynthia Becker MRN: [id number] Date of Birth: 1971/01/07

## 2016-10-17 ENCOUNTER — Ambulatory Visit: Payer: Medicaid Other | Admitting: Physical Therapy

## 2016-10-24 ENCOUNTER — Telehealth: Payer: Self-pay | Admitting: Physical Therapy

## 2016-10-24 ENCOUNTER — Encounter: Payer: Self-pay | Admitting: Physical Therapy

## 2016-10-24 ENCOUNTER — Ambulatory Visit: Payer: Medicaid Other | Attending: Primary Care | Admitting: Physical Therapy

## 2016-10-24 DIAGNOSIS — M5442 Lumbago with sciatica, left side: Secondary | ICD-10-CM | POA: Insufficient documentation

## 2016-10-24 DIAGNOSIS — R262 Difficulty in walking, not elsewhere classified: Secondary | ICD-10-CM | POA: Insufficient documentation

## 2016-10-24 DIAGNOSIS — M5441 Lumbago with sciatica, right side: Secondary | ICD-10-CM | POA: Diagnosis present

## 2016-10-24 DIAGNOSIS — M6281 Muscle weakness (generalized): Secondary | ICD-10-CM | POA: Diagnosis present

## 2016-10-24 DIAGNOSIS — G8929 Other chronic pain: Secondary | ICD-10-CM | POA: Diagnosis present

## 2016-10-24 NOTE — Therapy (Signed)
[facility] [address] [place], [place], [address] Phone: [phone]   Fax:  [phone]  Physical Therapy Treatment  Patient Details  Name: Cynthia Becker MRN: [id number] Date of Birth: 10/14/1970 Referring Provider: Kerin Perna, NP  Encounter Date: 10/24/2016      PT End of Session - 10/24/16 1656    Visit Number 3   Number of Visits 4  eval + 3 treatment   Date for PT Re-Evaluation 10/25/16   Authorization Type Medicaid     Authorization Time Period 3 visits 4-18- to 5-8   Authorization - Visit Number 2   Authorization - Number of Visits 3   PT Start Time 8841   PT Stop Time 1620   PT Time Calculation (min) 49 min   Activity Tolerance Patient limited by pain;Patient limited by fatigue  dyspnea 3 out of 4 after ambulating 25-50 ft   Behavior During Therapy [facility] for tasks assessed/performed      Past Medical History:  Diagnosis Date  . Anemia    iv iron treatment dec 2014/ see oncology for this last in dec 2014  . Anxiety   . Arthritis    knee, hands  . Asthma    seasonal related/ albuterol used when uri  . Depression   . H/O dizziness    Hx  . Headache(784.0)    otc med prn  . High blood pressure   . SVD (spontaneous vaginal delivery)    x 3    Past Surgical History:  Procedure Laterality Date  . BILATERAL SALPINGECTOMY Bilateral 04/12/2014   Procedure: BILATERAL SALPINGECTOMY;  Surgeon: Woodroe Mode, MD;  Location: [facility];  Service: Gynecology;  Laterality: Bilateral;  . CARPAL TUNNEL RELEASE  08/30/2011   Procedure: CARPAL TUNNEL RELEASE;  Surgeon: Wynonia Sours, MD;  Location: [facility];  Service: Orthopedics;  Laterality: Right;  . CHOLECYSTECTOMY  10/11   lap choli  . GALLBLADDER SURGERY  04/04/2010  . HYSTEROSCOPY N/A 02/08/2014   Procedure: HYSTEROSCOPY;  Surgeon: Woodroe Mode, MD;  Location: [facility];  Service: Gynecology;  Laterality: N/A;  . HYSTEROSCOPY WITH  NOVASURE N/A 02/08/2014   Procedure: ATTEMPTED NOVASURE ABLATION;  Surgeon: Woodroe Mode, MD;  Location: [facility];  Service: Gynecology;  Laterality: N/A;  . INTRAUTERINE DEVICE INSERTION     07/19/2013  . SEPTOPLASTY N/A 09/08/2013   Procedure: SEPTOPLASTY;  Surgeon: Ruby Cola, MD;  Location: [facility];  Service: ENT;  Laterality: N/A;  . TONSILLECTOMY AND ADENOIDECTOMY Bilateral 09/08/2013   Procedure: TONSILLECTOMY ;  Surgeon: Ruby Cola, MD;  Location: [facility];  Service: ENT;  Laterality: Bilateral;  . TUBAL LIGATION    . VAGINAL HYSTERECTOMY N/A 04/12/2014   Procedure: LAPAROSCOPIC ASSISTED VAGINAL HYSTERECTOMY;  Surgeon: Woodroe Mode, MD;  Location: [facility];  Service: Gynecology;  Laterality: N/A;  . WISDOM TOOTH EXTRACTION      There were no vitals filed for this visit.      Subjective Assessment - 10/24/16 1535    Subjective Tried to do exercises every other day and feels pain has been worse last 4 days. States she thinks she should be able to tolerate more activity and pushes herself to 9 to 10/10. Reports she has a hard time being patient and wants things to happen more quickly.    Pertinent History morbid obesity (~430 lbs, BMI > 60 per MD notes), HTN,    Limitations Sitting;Standing;Walking;House hold activities   How long  can you sit comfortably? depends, sometimes legs start hurting/burning and then go numb after ~5 minutes; sometimes can sit for much longer   How long can you stand comfortably? less than 2 minutes   How long can you walk comfortably? less than 2 minutes   Patient Stated Goals Wants to be more mobile and more independent so she can cook, go out to the store, "feel like I'm contributing to the family"   Currently in Pain? Yes   Pain Score 4    Pain Location Back   Pain Orientation Lower   Pain Descriptors / Indicators Aching;Burning   Pain Type Chronic pain   Pain Onset More than a month ago   Pain Frequency Intermittent   Pain Onset More than a month ago                          [facility] Adult PT Treatment/Exercise - 10/24/16 0001      Transfers   Transfers Sit to Stand;Stand to Sit   Sit to Stand 5: Supervision   Sit to Stand Details (indicate cue type and reason) vc for safe use of rollator vs RW   Stand to Sit 5: Supervision   Stand to Sit Details vc for safe use of rollator vs RW   Number of Reps --  5 over course of session     Ambulation/Gait   Ambulation/Gait Assistance 5: Supervision   Ambulation/Gait Assistance Details vc for proper use of rollator vs 2 wheel RW   Ambulation Distance (Feet) 25 Feet  50, 25   Assistive device Rolling walker;Rollator  bariatric RW   Gait Pattern Step-through pattern;Decreased stride length;Right foot flat;Left foot flat;Shuffle;Trunk flexed;Wide base of support   Ambulation Surface Level;Indoor     Wheelchair Mobility   Comments used clinic w/c to move from the lobby to the gym; pt dependent due to feet cannot reach the floor and hands cannot reach the wheels     Self-Care   Self-Care Other Self-Care Comments   Other Self-Care Comments  Educated in trying to parcel out her activities to improve her tolerance for activity     Exercises   Exercises Other Exercises   Other Exercises  Re-educated (due to pt misunderstanding) on how to slowly progress the # reps of exercises she is doing; discussed use of stretches again in the afternoon as she reports her most painful time of day is ~5:00 pm and later;                 PT Education - 10/24/16 1643    Education Details how to manage back pain through exercise and stretching, with goal for pain <8/10; gradual increase in exercise as tolerated; pro's and con's of bariatric rollator vs bariatric 2 wheel RW   Person(s) Educated Patient   Methods Explanation;Demonstration   Comprehension Verbalized understanding          PT Short Term Goals - 09/26/16 1935      PT SHORT TERM GOAL #1   Title See LTGs            PT Long Term Goals - 09/26/16 1935      PT LONG TERM GOAL #1   Title Patient will demonstrate independence with HEP for strengthening core and legs and stretching low back and legs TARGET 10/25/16   Baseline No current exercise program   Time 4   Period Weeks   Status New  PT LONG TERM GOAL #2   Title Patient will tolerate ambulating 200 ft with least restrictive assistive device with back/leg pain <5/10.    Baseline 55 ft with pain 5/10   Time 4   Period Weeks   Status New     PT LONG TERM GOAL #3   Title Patient will tolerate standing and short distance ambulation (as used when cooking) x 5 minutes with back pain <5/10   Baseline Can tolerate standing < 2 minutes with pain 5/10   Time 4   Period Weeks   Status New     PT LONG TERM GOAL #4   Title If patient unable to tolerate ambulation per goal #2, PT to reassess appropriateness for wheelchair and work with vendor to procure.   Baseline currently no DME/wheelchair   Time 4   Period Weeks   Status New               Plan - 10/24/16 1658    Clinical Impression Statement Discussed with patient that Medicaid is unlikely to approve a wheelchair for her at this time (based on discussion with PT in clinic in charge of w/c evaluations). Educated in use of rollator (did not have one with adequate width to practice turning and sitting on the seat, although PT demonstrated the process) vs bariatric RW. Patient feels RW better assists with relieving back pain more (and she was able to walk farther with it compared to rollator), however she feels having a ready seat to sit on is important as sometimes her back pain escalates quickly and she's afraid of falling. Ultimately patient decided on bariatric rollator; will request  MD order and insurance coverage of the rollator. Patient was given option of choosing DME provider and chose to use [facility]. Patient tearful over frustration of limited activity and appears very motivated  to lose weight through diet and exercise.    Rehab Potential Fair  radiculopathy has been progressive for over 3 years   Clinical Impairments Affecting Rehab Potential pain due to radiculopathy; morbid obesity limiting positions pt can achieve or maintain for stretching and strengthening   PT Frequency 1x / week   PT Duration 3 weeks   PT Treatment/Interventions ADLs/Self Care Home Management;Electrical Stimulation;Moist Heat;Ultrasound;DME Instruction;Gait training;Stair training;Functional mobility training;Therapeutic activities;Therapeutic exercise;Balance training;Neuromuscular re-education;Patient/family education;Wheelchair mobility training;Passive range of motion   PT Next Visit Plan give exercise chart created in 5/3 progress note; instruct in theraband UE/upper back exercises   PT Home Exercise Plan supine positioning with pillows for closed pack position/comfort; pelvic tilts; knee to chest with spouse assist;    Consulted and Agree with Plan of Care Patient      Patient will benefit from skilled therapeutic intervention in order to improve the following deficits and impairments:  Abnormal gait, Decreased activity tolerance, Decreased knowledge of use of DME, Decreased mobility, Decreased range of motion, Difficulty walking, Impaired sensation  Visit Diagnosis: Difficulty in walking, not elsewhere classified  Muscle weakness (generalized)     Problem List Patient Active Problem List   Diagnosis Date Noted  . Morbid obesity with BMI of 60.0-69.9, adult ([place]) 09/14/2014  . Iron deficiency anemia due to chronic blood loss 09/14/2014  . CN (constipation) 09/14/2014  . Postoperative state 04/12/2014  . Uterine fibroid 03/17/2014  . DUB (dysfunctional uterine bleeding) 01/27/2014  . Iron deficiency anemia due to chronic blood loss 04/09/2013  . 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 Menometrorrhagia 04/09/2013  . Left hand pain 09/18/2011  .  High blood pressure   . ANEMIA,  IRON DEFICIENCY 08/06/2010  . CHOLECYSTECTOMY, HX OF 04/09/2010  . HYPERLIPIDEMIA 02/13/2010  . DEGENERATIVE DISC DISEASE, LUMBOSACRAL SPINE W/RADICULOPATHY 11/01/2009  . BACK PAIN, LUMBAR, WITH RADICULOPATHY 10/30/2009  . TRIGGER FINGER 02/03/2009  . CARPAL TUNNEL SYNDROME 12/21/2008  . HEADACHE 11/09/2008  . DEPRESSION 10/04/2008  . URI 08/24/2008  . INSOMNIA 08/24/2008  . ASTHMA 06/06/2008  . OBESITY 05/24/2008  . ESSENTIAL HYPERTENSION 05/24/2008    Rexanne Mano, PT 10/24/2016, 5:10 PM  [facility] [address] [place], [place], [address] Phone: [phone]   Fax:  [phone]  Name: Cynthia Becker MRN: [id number] Date of Birth: [birth year]

## 2016-10-24 NOTE — Patient Instructions (Addendum)
(  Exercise) Monday Tuesday Wednesday Thursday Friday Saturday Sunday   Single Knee to Chest Stretch           Trunk Rotation Stretch           Seated Hamstring Stretch           Pelvic Tilt Stretch and strengthen           Abdominal strengthening          Seated  Row Upper back strengthening         Arm Diagonals for back strengthening          Walking (distance vs time)             Pool (time)

## 2016-10-24 NOTE — Telephone Encounter (Signed)
   Thank you for referring Cynthia Becker to Physical Therapy. She is being treated for back pain and difficulty walking.  She will benefit from use of a bariatric 4 wheel rollator with a seat in order to improve safety and increased tolerance with functional mobility.    If you agree, please submit request in EPIC under referral for DME (list bariatric 4 wheel rollator with a seat comments) or fax to [facility] at [phone].   Thank you, Barry Brunner, [facility] [address] [place], [place]  [address] Phone:  [phone] Fax:  [phone]

## 2016-10-28 ENCOUNTER — Encounter: Payer: Self-pay | Admitting: Physical Therapy

## 2016-10-28 ENCOUNTER — Ambulatory Visit: Payer: Medicaid Other | Admitting: Physical Therapy

## 2016-10-28 DIAGNOSIS — M6281 Muscle weakness (generalized): Secondary | ICD-10-CM

## 2016-10-28 DIAGNOSIS — R262 Difficulty in walking, not elsewhere classified: Secondary | ICD-10-CM

## 2016-10-28 DIAGNOSIS — G8929 Other chronic pain: Secondary | ICD-10-CM

## 2016-10-28 DIAGNOSIS — M5441 Lumbago with sciatica, right side: Secondary | ICD-10-CM

## 2016-10-28 DIAGNOSIS — M5442 Lumbago with sciatica, left side: Secondary | ICD-10-CM

## 2016-10-28 NOTE — Patient Instructions (Addendum)
Sleeping on Back  Place pillow under knees. A pillow with cervical support and a roll around waist are also helpful. Copyright  VHI. All rights reserved.  Sleeping on Side Place pillow between knees. Use cervical support under neck and a roll around waist as needed. Copyright  VHI. All rights reserved.   Sleeping on Stomach   If this is the only desirable sleeping position, place pillow under lower legs, and under stomach or chest as needed.  Posture - Sitting   Sit upright, head facing forward. Try using a roll to support lower back. Keep shoulders relaxed, and avoid rounded back. Keep hips level with knees. Avoid crossing legs for long periods. Stand to Sit / Sit to Stand   To sit: Bend knees to lower self onto front edge of chair, then scoot back on seat. To stand: Reverse sequence by placing one foot forward, and scoot to front of seat. Use rocking motion to stand up.   Work Height and Reach  Ideal work height is no more than 2 to 4 inches below elbow level when standing, and at elbow level when sitting. Reaching should be limited to arm's length, with elbows slightly bent.  Bending  Bend at hips and knees, not back. Keep feet shoulder-width apart.    Posture - Standing   Good posture is important. Avoid slouching and forward head thrust. Maintain curve in low back and align ears over shoul- ders, hips over ankles.  Alternating Positions   Alternate tasks and change positions frequently to reduce fatigue and muscle tension. Take rest breaks. Computer Work   Position work to face forward. Use proper work and seat height. Keep shoulders back and down, wrists straight, and elbows at right angles. Use chair that provides full back support. Add footrest and lumbar roll as needed.  Getting Into / Out of Car  Lower self onto seat, scoot back, then bring in one leg at a time. Reverse sequence to get out.  Dressing  Lie on back to pull socks or slacks over feet, or sit  and bend leg while keeping back straight.    Housework - Sink  Place one foot on ledge of cabinet under sink when standing at sink for prolonged periods.   Pushing / Pulling  Pushing is preferable to pulling. Keep back in proper alignment, and use leg muscles to do the work.  Deep Squat   Squat and lift with both arms held against upper trunk. Tighten stomach muscles without holding breath. Use smooth movements to avoid jerking.  Avoid Twisting   Avoid twisting or bending back. Pivot around using foot movements, and bend at knees if needed when reaching for articles.  Carrying Luggage   Distribute weight evenly on both sides. Use a cart whenever possible. Do not twist trunk. Move body as a unit.   Lifting Principles .Maintain proper posture and head alignment. .Slide object as close as possible before lifting. .Move obstacles out of the way. .Test before lifting; ask for help if too heavy. .Tighten stomach muscles without holding breath. .Use smooth movements; do not jerk. .Use legs to do the work, and pivot with feet. .Distribute the work load symmetrically and close to the center of trunk. .Push instead of pull whenever possible.   Ask For Help   Ask for help and delegate to others when possible. Coordinate your movements when lifting together, and maintain the low back curve.  Log Roll   Lying on back, bend left knee and place left   arm across chest. Roll all in one movement to the right. Reverse to roll to the left. Always move as one unit. Housework - Sweeping  Use long-handled equipment to avoid stooping.   Housework - Wiping  Position yourself as close as possible to reach work surface. Avoid straining your back.  Laundry - Unloading Wash   To unload small items at bottom of washer, lift leg opposite to arm being used to reach.  [place] close to area to be raked. Use arm movements to do the work. Keep back straight and avoid  twisting.     Cart  When reaching into cart with one arm, lift opposite leg to keep back straight.   Getting Into / Out of Bed  Lower self to lie down on one side by raising legs and lowering head at the same time. Use arms to assist moving without twisting. Bend both knees to roll onto back if desired. To sit up, start from lying on side, and use same move-ments in reverse. Housework - Vacuuming  Hold the vacuum with arm held at side. Step back and forth to move it, keeping head up. Avoid twisting.   Laundry - IT consultant so that bending and twisting can be avoided.   Laundry - Unloading Dryer  Squat down to reach into clothes dryer or use a reacher.  Gardening - Weeding / Probation officer or Kneel. Knee pads may be helpful.                    (Exercise) Monday Tuesday Wednesday Thursday Friday Saturday Sunday   Single Knee to Chest Stretch           Trunk Rotation Stretch           Seated Hamstring Stretch           Pelvic Tilt Stretch and strengthen           Abdominal strengthening          Seated  Row Upper back strengthening         Arm Diagonals for back strengthening          Walking (distance vs time)             Pool (time)

## 2016-10-28 NOTE — Therapy (Signed)
[facility] [address] [place], [place], [address] Phone: [phone]   Fax:  [phone]  Physical Therapy Treatment and discharge Summary  Patient Details  Name: Cynthia Becker MRN: [id number] Date of Birth: [birth year] Referring Provider: Kerin Perna, NP  Encounter Date: 10/28/2016      PT End of Session - 10/28/16 2101    Visit Number 4   Number of Visits 4  eval + 3 treatment   Date for PT Re-Evaluation 10/25/16   Authorization Type Medicaid     Authorization Time Period 3 visits 4-18- to 5-8   Authorization - Visit Number 3   Authorization - Number of Visits 3   PT Start Time 1536   PT Stop Time 1620   PT Time Calculation (min) 44 min   Activity Tolerance Patient limited by pain;Patient tolerated treatment well  dyspnea 3 out of 4 after ambulating 25-50 ft   Behavior During Therapy [facility] for tasks assessed/performed      Past Medical History:  Diagnosis Date  . Anemia    iv iron treatment dec 2014/ see oncology for this last in dec 2014  . Anxiety   . Arthritis    knee, hands  . Asthma    seasonal related/ albuterol used when uri  . Depression   . H/O dizziness    Hx  . Headache(784.0)    otc med prn  . High blood pressure   . SVD (spontaneous vaginal delivery)    x 3    Past Surgical History:  Procedure Laterality Date  . BILATERAL SALPINGECTOMY Bilateral 04/12/2014   Procedure: BILATERAL SALPINGECTOMY;  Surgeon: Woodroe Mode, MD;  Location: [facility];  Service: Gynecology;  Laterality: Bilateral;  . CARPAL TUNNEL RELEASE  08/30/2011   Procedure: CARPAL TUNNEL RELEASE;  Surgeon: Wynonia Sours, MD;  Location: [facility];  Service: Orthopedics;  Laterality: Right;  . CHOLECYSTECTOMY  10/11   lap choli  . GALLBLADDER SURGERY  04/04/2010  . HYSTEROSCOPY N/A 02/08/2014   Procedure: HYSTEROSCOPY;  Surgeon: Woodroe Mode, MD;  Location: [facility];  Service: Gynecology;  Laterality:  N/A;  . HYSTEROSCOPY WITH NOVASURE N/A 02/08/2014   Procedure: ATTEMPTED NOVASURE ABLATION;  Surgeon: Woodroe Mode, MD;  Location: [facility];  Service: Gynecology;  Laterality: N/A;  . INTRAUTERINE DEVICE INSERTION     07/19/2013  . SEPTOPLASTY N/A 09/08/2013   Procedure: SEPTOPLASTY;  Surgeon: Ruby Cola, MD;  Location: [facility];  Service: ENT;  Laterality: N/A;  . TONSILLECTOMY AND ADENOIDECTOMY Bilateral 09/08/2013   Procedure: TONSILLECTOMY ;  Surgeon: Ruby Cola, MD;  Location: [facility];  Service: ENT;  Laterality: Bilateral;  . TUBAL LIGATION    . VAGINAL HYSTERECTOMY N/A 04/12/2014   Procedure: LAPAROSCOPIC ASSISTED VAGINAL HYSTERECTOMY;  Surgeon: Woodroe Mode, MD;  Location: [facility];  Service: Gynecology;  Laterality: N/A;  . WISDOM TOOTH EXTRACTION      There were no vitals filed for this visit.      Subjective Assessment - 10/28/16 1535    Subjective Back and especially sciatic nerve pain has been worse over the weekend. States she did try stretches for her low back with some relief, however her leg pain only subsided with husband massaging legs while she was lying prone.    Pertinent History morbid obesity (~430 lbs, BMI > 60 per MD notes), HTN,    Limitations Sitting;Standing;Walking;House hold activities   How long can you sit comfortably? depends, sometimes legs  start hurting/burning and then go numb after ~5 minutes; sometimes can sit for much longer   How long can you stand comfortably? less than 2 minutes   How long can you walk comfortably? less than 2 minutes   Patient Stated Goals Wants to be more mobile and more independent so she can cook, go out to the store, "feel like I'm contributing to the family"   Currently in Pain? Yes   Pain Score 5    Pain Location Back   Pain Orientation Lower   Pain Descriptors / Indicators Aching;Burning   Pain Type Chronic pain   Pain Radiating Towards right foot   Pain Onset More than a month ago   Pain Frequency Intermittent    Pain Onset More than a month ago                         [facility] Adult PT Treatment/Exercise - 10/28/16 0001      Transfers   Transfers Sit to Stand;Stand to Sit   Sit to Stand 6: Modified independent (Device/Increase time)   Stand to Sit 6: Modified independent (Device/Increase time)   Comments using rollator appropriately     Ambulation/Gait   Ambulation/Gait Assistance 5: Supervision   Ambulation Distance (Feet) 50 Feet  25   Assistive device Rollator   Gait Pattern Step-through pattern;Decreased step length - right;Decreased step length - left;Right foot flat;Left foot flat;Shuffle;Trunk flexed   Ambulation Surface Level;Indoor     Wheelchair Mobility   Comments used clinic w/c to move from the lobby to the gym; pt dependent due to feet cannot reach the floor and hands cannot reach the wheels     Exercises   Other Exercises  seated row with yellow band x 10 reps; seated "diagonal pulls" with yellow band x 5 each diagonal                PT Education - 10/28/16 2059    Education provided Yes   Education Details upper back exercises for HEP; use of chart (see pt instructions) for planning and tracking HEP; provided basic back exercise program and discussed progression of current exercises; provided back care handout; provided information re: [facility] (pt has been having a hard time getting her NP to respond to prescription requests)   Person(s) Educated Patient   Methods Explanation;Demonstration;Handout   Comprehension Verbalized understanding;Returned demonstration          PT Short Term Goals - 09/26/16 1935      PT SHORT TERM GOAL #1   Title See LTGs           PT Long Term Goals - 10/28/16 2107      PT LONG TERM GOAL #1   Title Patient will demonstrate independence with HEP for strengthening core and legs and stretching low back and legs TARGET 10/25/16; 10/28/16 MET   Baseline No current exercise program   Time 4    Period Weeks   Status Achieved     PT LONG TERM GOAL #2   Title Patient will tolerate ambulating 200 ft with least restrictive assistive device with back/leg pain <5/10. 10/28/16  60 ft with rollator with pain up to 7.    Baseline 55 ft with pain 5/10   Time 4   Period Weeks   Status Not Met     PT LONG TERM GOAL #3   Title Patient will tolerate standing and short distance ambulation (as used  when cooking) x 5 minutes with back pain <5/10; 10/28/16 standing and short distance ambulation met, however pain 7/10   Baseline Can tolerate standing < 2 minutes with pain 5/10   Time 4   Period Weeks   Status Partially Met     PT LONG TERM GOAL #4   Title If patient unable to tolerate ambulation per goal #2, PT to reassess appropriateness for wheelchair and work with vendor to procure. 10/28/16 deferred at recommendation of wheelchair specialist due to Medicaid would not provide a wheelchair for use in the community.   Baseline currently no DME/wheelchair   Time 4   Period Weeks   Status Deferred               Plan - 10/28/16 2102    Clinical Impression Statement Discussed with patient the process to obtain a rollator due to her PCP has not responded to Epic request or traditional FAX and this is her last approved PT appointment. Provided patient with written information (bariatric rollator; DME providers and need to call to determine if they accept Medicaid). Patient able to return demonstrate proper use of rollator with use of clinic model. Educated patient on exercise progressions and provided yellow and green theraband for UE, upper back exercises. Provided packet on body mechanics and how to perform common every day tasks while protecting her back. All questions answered. Patient met 1 of 4 LTGs, partially met 1 of 4, deferred 1 of 4, and did not meet 1 of 4 LTGs. Patient could have benefitted from additional PT, however could not afford to pay what her insurance did not cover.    Rehab  Potential Fair  radiculopathy has been progressive for over 3 years   Clinical Impairments Affecting Rehab Potential pain due to radiculopathy; morbid obesity limiting positions pt can achieve or maintain for stretching and strengthening   PT Frequency --   PT Duration --   PT Treatment/Interventions ADLs/Self Care Home Management;Electrical Stimulation;Moist Heat;Ultrasound;DME Instruction;Gait training;Stair training;Functional mobility training;Therapeutic activities;Therapeutic exercise;Balance training;Neuromuscular re-education;Patient/family education;Wheelchair mobility training;Passive range of motion   PT Next Visit Plan --   PT Home Exercise Plan --   Consulted and Agree with Plan of Care Patient      Patient will benefit from skilled therapeutic intervention in order to improve the following deficits and impairments:  Abnormal gait, Decreased activity tolerance, Decreased knowledge of use of DME, Decreased mobility, Decreased range of motion, Difficulty walking, Impaired sensation  Visit Diagnosis: Difficulty in walking, not elsewhere classified  Muscle weakness (generalized)  Chronic midline low back pain with right-sided sciatica  Chronic midline low back pain with left-sided sciatica     Problem List Patient Active Problem List   Diagnosis Date Noted  . Morbid obesity with BMI of 60.0-69.9, adult ([place]) 09/14/2014  . Iron deficiency anemia due to chronic blood loss 09/14/2014  . CN (constipation) 09/14/2014  . Postoperative state 04/12/2014  . Uterine fibroid 03/17/2014  . DUB (dysfunctional uterine bleeding) 01/27/2014  . Iron deficiency anemia due to chronic blood loss 04/09/2013  . 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 Menometrorrhagia 04/09/2013  . Left hand pain 09/18/2011  . High blood pressure   . ANEMIA, IRON DEFICIENCY 08/06/2010  . CHOLECYSTECTOMY, HX OF 04/09/2010  . HYPERLIPIDEMIA 02/13/2010  . DEGENERATIVE DISC DISEASE, LUMBOSACRAL SPINE  W/RADICULOPATHY 11/01/2009  . BACK PAIN, LUMBAR, WITH RADICULOPATHY 10/30/2009  . TRIGGER FINGER 02/03/2009  . CARPAL TUNNEL SYNDROME 12/21/2008  . HEADACHE 11/09/2008  . DEPRESSION 10/04/2008  .  URI 08/24/2008  . INSOMNIA 08/24/2008  . ASTHMA 06/06/2008  . OBESITY 05/24/2008  . ESSENTIAL HYPERTENSION 05/24/2008   PHYSICAL THERAPY DISCHARGE SUMMARY  Visits from Start of Care: 4  Current functional level related to goals / functional outcomes: Patient tolerating increased ambulation with use of rollator, however remains limited in distance due to continued back and sciatic pain.    Remaining deficits: Back and bil sciatic pain   Education / Equipment: HEP, body mechanics, how to obtain a rollator once she gets order from NP or MD  Plan: Patient agrees to discharge.  Patient goals were partially met. Patient is being discharged due to financial reasons.  ?????       Rexanne Mano, PT 10/28/2016, 9:17 PM  [facility] [address] [place], [place], [address] Phone: [phone]   Fax:  [phone]  Name: Cynthia Becker MRN: [id number] Date of Birth: Nov 01, 1970

## 2017-06-03 ENCOUNTER — Institutional Professional Consult (permissible substitution): Payer: Self-pay | Admitting: Neurology

## 2017-07-03 ENCOUNTER — Encounter: Payer: Self-pay | Admitting: Neurology

## 2017-07-03 ENCOUNTER — Ambulatory Visit: Payer: Medicaid Other | Admitting: Neurology

## 2017-07-03 VITALS — BP 161/93 | HR 123 | Ht 65.0 in | Wt >= 6400 oz

## 2017-07-03 DIAGNOSIS — R600 Localized edema: Secondary | ICD-10-CM | POA: Diagnosis not present

## 2017-07-03 DIAGNOSIS — R0683 Snoring: Secondary | ICD-10-CM

## 2017-07-03 DIAGNOSIS — R351 Nocturia: Secondary | ICD-10-CM | POA: Diagnosis not present

## 2017-07-03 DIAGNOSIS — R51 Headache: Secondary | ICD-10-CM

## 2017-07-03 DIAGNOSIS — G4719 Other hypersomnia: Secondary | ICD-10-CM | POA: Diagnosis not present

## 2017-07-03 DIAGNOSIS — R519 Headache, unspecified: Secondary | ICD-10-CM

## 2017-07-03 DIAGNOSIS — Z6841 Body Mass Index (BMI) 40.0 and over, adult: Secondary | ICD-10-CM

## 2017-07-03 NOTE — Patient Instructions (Signed)

## 2017-07-03 NOTE — Progress Notes (Signed)
Subjective:    Patient ID: Cynthia Becker is a 47 y.o. female.  HPI     Star Age, MD, PhD [facility] [address]O. [place], [place] [address]  Dear Dr. Virgina Jock,   I saw your patient, Cynthia Becker, upon your kind request, in my neurologic clinic today for initial consultation of her sleep disorder, in particular, concern for underlying obstructive sleep apnea. The patient is unaccompanied today. As you know, Cynthia Becker is a 47 year old right-handed woman with an underlying medical history of iron deficiency anemia, thrombocytosis, chronic constipation, smoking, uncontrolled hypertension, lower extremity edema, low back pain, and morbid obesity with a BMI of nearly 80, who reports snoring and excessive daytime somnolence. I reviewed your office note from 04/18/2017, which you kindly included. She has a lot of stressors currently. She takes care of her 2 small grandchildren, 19-year-old girl and 56-year-old boy. 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 Her Epworth sleepiness score is 17 out of 24 today, fatigue score is 61 out of 63. She does not sleep well at night. She goes to bed around 11 but may not be asleep until hours later. She has tried over-the-counter p.m. type medications but started taking too many. She stopped doing that. She was on a prescription medicine through her psychiatrist but is no longer taking that as she stopped going to the psychiatrist. She was not always able to go and needs to start the process all over. She has an appointment with [facility] tomorrow. She has a history of depression and anxiety and reports that she may be bipolar. She has significant nocturia about 4 times for average night and does have recurrent morning headaches. She snores but is not sure if she actually has breathing pauses while asleep. She has gained a lot of weight, attributed to distress. She drinks caffeine  in the form of coffee, usually 3 cups a day and occasional soda, no alcohol, tries to hydrate well. She smokes occasionally, a pack may last her one month. She knows that she needs to quit smoking. She is trying to lose weight. Since October 2018 she has lost about 30 pounds. She has been thinking about pursuing weight loss surgery.  Her Past Medical History Is Significant For: Past Medical History:  Diagnosis Date  . Anemia    iv iron treatment dec 2014/ see oncology for this last in dec 2014  . Anxiety   . Arthritis    knee, hands  . Asthma    seasonal related/ albuterol used when uri  . Depression   . H/O dizziness    Hx  . Headache(784.0)    otc med prn  . High blood pressure   . SVD (spontaneous vaginal delivery)    x 3    Her Past Surgical History Is Significant For: Past Surgical History:  Procedure Laterality Date  . BILATERAL SALPINGECTOMY Bilateral 04/12/2014   Procedure: BILATERAL SALPINGECTOMY;  Surgeon: Woodroe Mode, MD;  Location: [facility];  Service: Gynecology;  Laterality: Bilateral;  . CARPAL TUNNEL RELEASE  08/30/2011   Procedure: CARPAL TUNNEL RELEASE;  Surgeon: Wynonia Sours, MD;  Location: [facility];  Service: Orthopedics;  Laterality: Right;  . CHOLECYSTECTOMY  10/11   lap choli  . GALLBLADDER SURGERY  04/04/2010  . HYSTEROSCOPY N/A 02/08/2014   Procedure: HYSTEROSCOPY;  Surgeon: Woodroe Mode, MD;  Location: [facility];  Service: Gynecology;  Laterality: N/A;  . HYSTEROSCOPY WITH NOVASURE N/A 02/08/2014   Procedure: ATTEMPTED NOVASURE ABLATION;  Surgeon: Woodroe Mode, MD;  Location: [facility];  Service: Gynecology;  Laterality: N/A;  . INTRAUTERINE DEVICE INSERTION     07/19/2013  . SEPTOPLASTY N/A 09/08/2013   Procedure: SEPTOPLASTY;  Surgeon: Ruby Cola, MD;  Location: [facility];  Service: ENT;  Laterality: N/A;  . TONSILLECTOMY AND ADENOIDECTOMY Bilateral 09/08/2013   Procedure: TONSILLECTOMY ;  Surgeon: Ruby Cola, MD;  Location: [facility];   Service: ENT;  Laterality: Bilateral;  . TUBAL LIGATION    . VAGINAL HYSTERECTOMY N/A 04/12/2014   Procedure: LAPAROSCOPIC ASSISTED VAGINAL HYSTERECTOMY;  Surgeon: Woodroe Mode, MD;  Location: [facility];  Service: Gynecology;  Laterality: N/A;  . WISDOM TOOTH EXTRACTION      Her Family History Is Significant For: Family History  Problem Relation Age of Onset  . Diabetes Mother   . Hypertension Father   . Heart disease Unknown   . Diabetes Unknown   . Hypertension Unknown   . Alcohol abuse Unknown     Her Social History Is Significant For: Social History   Socioeconomic History  . Marital status: Married    Spouse name: None  . Number of children: None  . Years of education: None  . Highest education level: None  Social Needs  . Financial resource strain: None  . Food insecurity - worry: None  . Food insecurity - inability: None  . Transportation needs - medical: None  . Transportation needs - non-medical: None  Occupational History  . None  Tobacco Use  . Smoking status: Former Smoker    Packs/day: 0.25    Types: Cigarettes    Last attempt to quit: 02/11/2016    Years since quitting: 1.3  . Smokeless tobacco: Never Used  Substance and Sexual Activity  . Alcohol use: No    Alcohol/week: 0.0 oz  . Drug use: No  . Sexual activity: Yes    Birth control/protection: Surgical  Other Topics Concern  . None  Social History Narrative  . None    Her Allergies Are:  No Known Allergies:   Her Current Medications Are:  Outpatient Encounter Medications as of 07/03/2017  Medication Sig  . albuterol (PROVENTIL HFA;VENTOLIN HFA) 108 (90 Base) MCG/ACT inhaler Inhale 1-2 puffs into the lungs every 6 (six) hours as needed for wheezing or shortness of breath.  Marland Kitchen albuterol (PROVENTIL HFA;VENTOLIN HFA) 108 (90 Base) MCG/ACT inhaler Inhale 2 puffs into the lungs every 6 (six) hours as needed for wheezing or shortness of breath.  . Aspirin Effervescent (ALKA-SELTZER PO) Take 2  tablets by mouth daily as needed (cold symptoms).  Marland Kitchen atorvastatin (LIPITOR) 10 MG tablet Take 10 mg by mouth daily.  Marland Kitchen azithromycin (ZITHROMAX) 250 MG tablet Take 1 tablet (250 mg total) by mouth daily. Take first 2 tablets together, then 1 every day until finished.  . beclomethasone (QVAR) 40 MCG/ACT inhaler Inhale 2 puffs into the lungs 2 (two) times daily.  . furosemide (LASIX) 20 MG tablet Take 20 mg by mouth daily.  Marland Kitchen guaiFENesin (MUCINEX CHEST CONGESTION CHILD) 100 MG/5ML liquid Take 200 mg by mouth 3 (three) times daily as needed for congestion.  Marland Kitchen ibuprofen (ADVIL,MOTRIN) 800 MG tablet Take 800 mg by mouth every 8 (eight) hours as needed for moderate pain.  . isosorbide-hydrALAZINE (BIDIL) 20-37.5 MG tablet Take 1 tablet by mouth 3 (three) times daily.  Marland Kitchen losartan-hydrochlorothiazide (HYZAAR) 100-25 MG tablet Take 1 tablet by mouth  daily.  . metoprolol tartrate (LOPRESSOR) 25 MG tablet Take 25 mg by mouth daily.  Marland Kitchen oxycodone (OXY-IR) 5 MG capsule Take 5 mg by mouth 3 (three) times daily.  . verapamil (CALAN) 120 MG tablet Take 120 mg by mouth 2 (two) times daily.  . [DISCONTINUED] azithromycin (ZITHROMAX Z-PAK) 250 MG tablet Take 1 tablet (250 mg total) by mouth daily. (Patient not taking: Reported on 03/13/2016)  . [DISCONTINUED] Brexpiprazole (REXULTI) 2 MG TABS Take 2 mg by mouth every morning.  . [DISCONTINUED] citalopram (CELEXA) 20 MG tablet Take 20 mg by mouth daily.  . [DISCONTINUED] dextromethorphan-guaiFENesin (TUSSIN DM) 10-100 MG/5ML liquid Take 10 mLs by mouth every 4 (four) hours as needed for cough.  . [DISCONTINUED] Fluticasone-Salmeterol (ADVAIR) 250-50 MCG/DOSE AEPB Inhale 1 puff into the lungs 2 (two) times daily. (Patient not taking: Reported on 03/13/2016)  . [DISCONTINUED] hydrochlorothiazide (HYDRODIURIL) 25 MG tablet Take 25 mg by mouth daily.  . [DISCONTINUED] levofloxacin (LEVAQUIN) 500 MG tablet Take 1 tablet (500 mg total) by mouth daily. (Patient not taking:  Reported on 03/13/2016)  . [DISCONTINUED] predniSONE (DELTASONE) 10 MG tablet Take 2 tablets (20 mg total) by mouth daily. (Patient not taking: Reported on 03/13/2016)  . [DISCONTINUED] predniSONE (DELTASONE) 10 MG tablet Sig: 4 tables once a day for 3 days, 3 tablets once a day X3 days, 2 tablets a day for 3 days, 1 tablet a day for 3 days. (Patient not taking: Reported on 03/13/2016)  . [DISCONTINUED] temazepam (RESTORIL) 15 MG capsule Take 15 mg by mouth at bedtime.   No facility-administered encounter medications on file as of 07/03/2017.   :  Review of Systems:  Out of a complete 14 point review of systems, all are reviewed and negative with the exception of these symptoms as listed below:  Review of Systems  Neurological:       Pt presents today to discuss her sleep. Pt has never had a sleep study but does endorse snoring.  Epworth Sleepiness Scale 0= would never doze 1= slight chance of dozing 2= moderate chance of dozing 3= high chance of dozing  Sitting and reading: 2 Watching TV: 2 Sitting inactive in a public place (ex. Theater or meeting): 2 As a passenger in a car for an hour without a break: 3 Lying down to rest in the afternoon: 2 Sitting and talking to someone: 1 Sitting quietly after lunch (no alcohol): 3 In a car, while stopped in traffic: 2 Total: 17     Objective:  Neurological Exam  Physical Exam Physical Examination:   Vitals:   07/03/17 1538  BP: (!) 161/93  Pulse: (!) 123    General Examination: The patient is a very pleasant 47 y.o. female in no acute distress. She appears well-developed and well-nourished and well groomed.   HEENT: Normocephalic, atraumatic, pupils are equal, round and reactive to light and accommodation. Extraocular tracking is good without limitation to gaze excursion or nystagmus noted. Normal smooth pursuit is noted. Hearing is grossly intact. Tympanic membranes are clear bilaterally. Face is symmetric with normal facial  animation and normal facial sensation. Speech is clear with no dysarthria noted. There is no hypophonia. There is no lip, neck/head, jaw or voice tremor. Neck is supple with full range of passive and active motion. There are no carotid bruits on auscultation. Oropharynx exam reveals: moderate mouth dryness, marginal dental hygiene and several missing teeth, moderate airway crowding secondary to larger tongue and wider uvula, redundant soft palate, tonsils are absent. Mallampati is  class II. Tongue protrudes centrally and palate elevates symmetrically. Neck size is 19 1/8 inches.   Chest: Clear to auscultation without wheezing, rhonchi or crackles noted.  Heart: S1+S2+0, regular and normal without murmurs, rubs or gallops noted.   Abdomen: Soft, non-tender and non-distended with normal bowel sounds appreciated on auscultation.  Extremities: There is 2+ pitting edema in the distal lower extremities bilaterally. Pedal pulses are intact.  Skin: Warm and very dry and scaly.  Musculoskeletal: exam reveals no obvious joint deformities, tenderness or joint swelling or erythema.   Neurologically:   Mental status: The patient is awake, alert and oriented in all 4 spheres. Her immediate and remote memory, attention, language skills and fund of knowledge are appropriate. There is no evidence of aphasia, agnosia, apraxia or anomia. Speech is clear with normal prosody and enunciation. Thought process is linear. Mood is normal and affect is normal.  Cranial nerves II - XII are as described above under HEENT exam. In addition: shoulder shrug is normal with equal shoulder height noted. Motor exam: Normal bulk, strength and tone is noted. There is no drift, tremor or rebound. Romberg is negative. Reflexes are difficult to elicit. Fine motor skills and coordination: intact with normal finger taps, normal hand movements, normal rapid alternating patting, normal foot taps and normal foot agility.  Cerebellar testing:  No dysmetria or intention tremor.  Sensory exam: intact to light touch in the upper and lower extremities.  Gait, station and balance: She stands with difficulty. She walks slowly. Tandem walk is not possible  Assessment and Plan:   In summary, Teretha Chalupa is a very pleasant 47 y.o.-year old female with an underlying medical history of iron deficiency anemia, thrombocytosis, chronic constipation, smoking, uncontrolled hypertension, lower extremity edema, low back pain, and morbid obesity with a BMI of nearly 80, whose history and physical exam are concerning for obstructive sleep apnea (OSA). I had a long chat with the patient about my findings and the diagnosis of OSA, its prognosis and treatment options. We talked about medical treatments, surgical interventions and non-pharmacological approaches. I explained in particular the risks and ramifications of untreated moderate to severe OSA, especially with respect to developing cardiovascular disease down the Road, including congestive heart failure, difficult to treat hypertension, cardiac arrhythmias, or stroke. Even type 2 diabetes has, in part, been linked to untreated OSA. Symptoms of untreated OSA include daytime sleepiness, memory problems, mood irritability and mood disorder such as depression and anxiety, lack of energy, as well as recurrent headaches, especially morning headaches. We talked about smoking cessation and trying to maintain a healthy lifestyle in general, as well as the importance of weight control. I encouraged the patient to eat healthy, exercise daily and keep well hydrated, to keep a scheduled bedtime and wake time routine, to not skip any meals and eat healthy snacks in between meals. I advised the patient not to drive when feeling sleepy. I recommended the following at this time: sleep study with potential positive airway pressure titration. (We will score hypopneas at 4%).   I explained the sleep test procedure to the patient  and also outlined possible surgical and non-surgical treatment options of OSA, including the use of a custom-made dental device (which would require a referral to a specialist dentist or oral surgeon), upper airway surgical options, such as pillar implants, radiofrequency surgery, tongue base surgery, and UPPP (which would involve a referral to an ENT surgeon). Rarely, jaw surgery such as mandibular advancement may be considered.  I also  explained the CPAP treatment option to the patient, who indicated that she would be willing to try CPAP if the need arises. I explained the importance of being compliant with PAP treatment, not only for insurance purposes but primarily to improve Her symptoms, and for the patient's long term health benefit, including to reduce Her cardiovascular risks. I answered all her questions today and the patient was in agreement. I would like to see her back after the sleep study is completed and encouraged her to call with any interim questions, concerns, problems or updates.   Thank you very much for allowing me to participate in the care of this nice patient. If I can be of any further assistance to you please do not hesitate to call me at [phone].  Sincerely,   Star Age, MD, PhD

## 2017-08-04 ENCOUNTER — Ambulatory Visit (INDEPENDENT_AMBULATORY_CARE_PROVIDER_SITE_OTHER): Payer: Medicaid Other | Admitting: Neurology

## 2017-08-04 DIAGNOSIS — R0683 Snoring: Secondary | ICD-10-CM

## 2017-08-04 DIAGNOSIS — G4710 Hypersomnia, unspecified: Secondary | ICD-10-CM | POA: Diagnosis not present

## 2017-08-04 DIAGNOSIS — R519 Headache, unspecified: Secondary | ICD-10-CM

## 2017-08-04 DIAGNOSIS — R51 Headache: Secondary | ICD-10-CM

## 2017-08-04 DIAGNOSIS — G4720 Circadian rhythm sleep disorder, unspecified type: Secondary | ICD-10-CM

## 2017-08-04 DIAGNOSIS — G4734 Idiopathic sleep related nonobstructive alveolar hypoventilation: Secondary | ICD-10-CM

## 2017-08-04 DIAGNOSIS — R351 Nocturia: Secondary | ICD-10-CM

## 2017-08-04 DIAGNOSIS — Z6841 Body Mass Index (BMI) 40.0 and over, adult: Secondary | ICD-10-CM

## 2017-08-04 DIAGNOSIS — R600 Localized edema: Secondary | ICD-10-CM

## 2017-08-04 DIAGNOSIS — G4719 Other hypersomnia: Secondary | ICD-10-CM

## 2017-08-07 ENCOUNTER — Telehealth: Payer: Self-pay

## 2017-08-07 NOTE — Telephone Encounter (Signed)
-----   Message from Cynthia Age, MD sent at 08/07/2017  8:03 AM EST ----- Patient referred by Dr. Virgina Jock, seen by me on 07/03/17, diagnostic PSG on 08/04/17.   Please call and notify the patient that the recent sleep study did not show any significant obstructive sleep apnea. Total AHI was below 5/hour. However, the patient has REM related OSA and snoring, which was moderate to loud throughout the night. For this, treatment with CPAP is not warranted. For disturbing snoring, an oral appliance (through a qualified dentist) can be considered. My recommendations: weight loss is highly recommended and like will help reduce snoring and REM related OSA. She was noted to have lower oxygen saturations during sleep and perhaps during wakefulness. I would like for her to seek consultation with a pulmonologist. In light of lower oxygen saturations noted and history of smoking, an underlying lung disease is possible. I would advise, she talk to her PCP about this.  At this point, she can follow up with her cardiologist and PCP. Please remind patient to try to maintain good sleep hygiene, which means: Keep a regular sleep and wake schedule and make enough time for sleep (7 1/2 to 8 1/2 hours for the average adult), try not to exercise or have a meal within 2 hours of your bedtime, try to keep your bedroom conducive for sleep, that is, cool and dark, without light distractors such as an illuminated alarm clock, and refrain from watching TV right before sleep or in the middle of the night and do not keep the TV or radio on during the night. If a nightlight is used, have it away from the visual field. Also, try not to use or play on electronic devices at bedtime, such as your cell phone, tablet PC or laptop. If you like to read at bedtime on an electronic device, try to dim the background light as much as possible. Do not eat in the middle of the night. Keep pets away from the bedroom environment. For stress relief, try  meditation, deep breathing exercises (there are many books and CDs available), a white noise machine or fan can help to diffuse other noise distractors, such as traffic noise. Do not drink alcohol before bedtime, as it can disturb sleep and cause middle of the night awakenings. Never mix alcohol and sedating medications! Avoid narcotic pain medication close to bedtime, as opioids/narcotics can suppress breathing drive and breathing effort.    Cynthia Age, MD, PhD [facility])

## 2017-08-07 NOTE — Progress Notes (Signed)
Patient referred by Dr. Virgina Jock, seen by me on 07/03/17, diagnostic PSG on 08/04/17.   Please call and notify the patient that the recent sleep study did not show any significant obstructive sleep apnea. Total AHI was below 5/hour. However, the patient has REM related OSA and snoring, which was moderate to loud throughout the night. For this, treatment with CPAP is not warranted. For disturbing snoring, an oral appliance (through a qualified dentist) can be considered. My recommendations: weight loss is highly recommended and like will help reduce snoring and REM related OSA. She was noted to have lower oxygen saturations during sleep and perhaps during wakefulness. I would like for her to seek consultation with a pulmonologist. In light of lower oxygen saturations noted and history of smoking, an underlying lung disease is possible. I would advise, she talk to her PCP about this.  At this point, she can follow up with her cardiologist and PCP. Please remind patient to try to maintain good sleep hygiene, which means: Keep a regular sleep and wake schedule and make enough time for sleep (7 1/2 to 8 1/2 hours for the average adult), try not to exercise or have a meal within 2 hours of your bedtime, try to keep your bedroom conducive for sleep, that is, cool and dark, without light distractors such as an illuminated alarm clock, and refrain from watching TV right before sleep or in the middle of the night and do not keep the TV or radio on during the night. If a nightlight is used, have it away from the visual field. Also, try not to use or play on electronic devices at bedtime, such as your cell phone, tablet PC or laptop. If you like to read at bedtime on an electronic device, try to dim the background light as much as possible. Do not eat in the middle of the night. Keep pets away from the bedroom environment. For stress relief, try meditation, deep breathing exercises (there are many books and CDs available),  a white noise machine or fan can help to diffuse other noise distractors, such as traffic noise. Do not drink alcohol before bedtime, as it can disturb sleep and cause middle of the night awakenings. Never mix alcohol and sedating medications! Avoid narcotic pain medication close to bedtime, as opioids/narcotics can suppress breathing drive and breathing effort.    Cynthia Age, MD, PhD [facility])

## 2017-08-07 NOTE — Procedures (Signed)
PATIENT'S NAME:  Cynthia Becker, Cynthia Becker DOB:      Jul 08, 1970      MR#:    [id number]     DATE OF RECORDING: 08/04/2017 REFERRING M.D.:  Virgina Jock, MD Study Performed:   Baseline Polysomnogram HISTORY: 47 year old woman with a history of iron deficiency anemia, thrombocytosis, chronic constipation, smoking, uncontrolled hypertension, lower extremity edema, low back pain, and morbid obesity, who reports snoring and excessive daytime somnolence. The patient endorsed the Epworth Sleepiness Scale at 17/24 points. The patient's weight 463 pounds with a height of 65 (inches), resulting in a BMI of 77.1 kg/m2. The patient's neck circumference measured 19.2 inches.  CURRENT MEDICATIONS: Albuterol, Aspirin, Atorvastatin, Azithromycin, Beclomethasone, Furosemide, Guafenesin, Ibuprofen, Isosorbide, Losartan-Hydralazine, Metoprolol tartrate, Oxycodone and Verapamil.   PROCEDURE:  This is a multichannel digital polysomnogram utilizing the Somnostar 11.2 system.  Electrodes and sensors were applied and monitored per AASM Specifications.   EEG, EOG, Chin and Limb EMG, were sampled at 200 Hz.  ECG, Snore and Nasal Pressure, Thermal Airflow, Respiratory Effort, CPAP Flow and Pressure, Oximetry was sampled at 50 Hz. Digital video and audio were recorded.      BASELINE STUDY  Lights Out was at 23:08 and Lights On at 05:10.  Total recording time (TRT) was 363 minutes, with a total sleep time (TST) of 217 minutes.   The patient's sleep latency was 89.5 minutes, which is delayed. REM latency was 172.5 minutes, which is delayed. The sleep efficiency was 59.8 %, which is reduced.     SLEEP ARCHITECTURE: WASO (Wake after sleep onset) was 35 minutes, and the patient woke up around 4 AM, and could not return back to sleep. There was moderate sleep fragmentation overall. There were 31 minutes in Stage N1, 176 minutes Stage N2, 0 minutes Stage N3 and 10 minutes in Stage REM.  The percentage of Stage N1 was 14.3%, which is increased,  Stage N2 was 81.1%, which is markedly increased, Stage N3 was absent and Stage R (REM sleep) was 4.6%, which is markedly reduced. The arousals were noted as: 42 were spontaneous, 0 were associated with PLMs, 11 were associated with respiratory events.  Audio and video analysis did not show any abnormal or unusual movements, behaviors, phonations or vocalizations. The patient took 1 bathroom break. Moderate to loud snoring was noted. The EKG was in keeping with normal sinus rhythm (NSR).  RESPIRATORY ANALYSIS:  There were a total of 11 respiratory events:  7 obstructive apneas, 0 central apneas and 0 mixed apneas with a total of 7 apneas and an apnea index (AI) of 1.9 /hour. There were 4 hypopneas with a hypopnea index of 1.1 /hour. The patient also had 0 respiratory event related arousals (RERAs).      The total APNEA/HYPOPNEA INDEX (AHI) was 3./hour and the total RESPIRATORY DISTURBANCE INDEX was 3. /hour.  8 events occurred in REM sleep and 6 events in NREM. The REM AHI was 48 /hour, versus a non-REM AHI of .9. The patient spent 136.5 minutes of total sleep time in the supine position and 81 minutes in non-supine. The supine AHI was 4.4 versus a non-supine AHI of 0.7.  OXYGEN SATURATION & C02:  The Wake baseline 02 saturation was 90%, with the lowest being 80%. Time spent below 89% saturation equaled 100 minutes, which is overestimated, as there were significant issues with O2 sensor signal loss.  PERIODIC LIMB MOVEMENTS: The patient had a total of 0 Periodic Limb Movements.  The Periodic Limb Movement (PLM) index was 0 and the  PLM Arousal index was 0/hour.  Post-study, the patient indicated that sleep was worse than usual.   IMPRESSION:  1. Primary Snoring 2. Oxygen desaturations in sleep 3. Dysfunctions associated with sleep stages or arousal from sleep  RECOMMENDATIONS:  1. This study does not demonstrate any significant obstructive or central sleep disordered breathing, in that the  overall AHI was below 5/hour. However, the patient has REM related OSA and snoring, which was moderate to loud. For this, treatment with CPAP is not warranted. The reduced sleep efficiency and reduced amount of REM sleep may likely underestimates her sleep disordered breathing. For disturbing snoring, an oral appliance (through a qualified dentist) can be considered.  2. Weight loss is highly recommended and like will help reduce snoring and REM related OSA.  3. The patient was noted to have lower oxygen saturations during sleep and perhaps during wakefulness. She will be advised to seek consultation with a pulmonologist. In light of lower oxygen saturations noted and history of smoking, an underlying lung disease is possible. She will be advised to talk to her PCP about this.  4. This study shows sleep fragmentation and abnormal sleep stage percentages; these are nonspecific findings and per se do not signify an intrinsic sleep disorder or a cause for the patient's sleep-related symptoms. Causes include (but are not limited to) the first night effect of the sleep study, circadian rhythm disturbances, medication effect or an underlying mood disorder or medical problem.  5. The patient should be cautioned not to drive, work at heights, or operate dangerous or heavy equipment when tired or sleepy. Review and reiteration of good sleep hygiene measures should be pursued with any patient. 6. The patient can follow-up with her referring provider, who will be notified of the test results.  I certify that I have reviewed the entire raw data recording prior to the issuance of this report in accordance with the Standards of Accreditation of the American Academy of Sleep Medicine (AASM)   Star Age, MD, PhD Diplomat, American Board of Psychiatry and Neurology (Neurology and Sleep Medicine)

## 2017-08-07 NOTE — Telephone Encounter (Signed)
I called pt. I advised pt that Dr. Rexene Alberts reviewed pt's sleep study and found that pt did not show any significant osa, just REM related osa and snoring which was moderate to loud throughout the night. Dr. Rexene Alberts recommends that pt discuss an oral appliance for snoring treatment with a qualified dentist. I advised pt that she pursue weight loss which will likely help with snoring and REM related osa. Pt was noted to have lower O2 saturations during sleep and perhaps during wakefulness. Dr. Rexene Alberts recommends that pt seek consultation with a pulmonologist in light of her lower oxygen saturations and history of smoking to rule out an underlying lung disease. Pt says that she was recently started on O2 and will speak with her PCP and cardiologist regarding this. I reviewed sleep hygiene recommendations with the pt, including trying to keep a regular sleep wake schedule, avoiding electronics in the bedroom, keeping the bedroom cool, dark, and quiet, and avoiding eating or exercising within 2 hours of bedtime as well as eating in the middle of the night. I advised pt to keep pets out of the bedroom. I discussed with pt the importance of stress relief and to try meditation, deep breathing exercises, and/or a white noise machine or fan to diffuse other noise distracters. I advised pt to not drink alcohol before bedtime and to never mix alcohol and sedating medications. Pt was advised to avoid narcotic pain medication close to bedtime. I advised pt that a copy of these sleep study results will be sent to Dr. Virgina Jock. Pt verbalized understanding of results. Pt had no questions at this time but was encouraged to call back if questions arise.

## 2018-01-17 ENCOUNTER — Ambulatory Visit (HOSPITAL_COMMUNITY)
Admission: EM | Admit: 2018-01-17 | Discharge: 2018-01-17 | Disposition: A | Discharge status: Home / Self Care | Payer: Medicaid Other | Attending: Internal Medicine | Admitting: Internal Medicine

## 2018-01-17 ENCOUNTER — Encounter (HOSPITAL_COMMUNITY): Payer: Self-pay

## 2018-01-17 DIAGNOSIS — M5431 Sciatica, right side: Secondary | ICD-10-CM

## 2018-01-17 MED ORDER — CYCLOBENZAPRINE HCL 5 MG PO TABS: 5.0000 mg | ORAL_TABLET | Freq: Every day | ORAL | 0 refills | Status: DC

## 2018-01-17 NOTE — ED Triage Notes (Signed)
Pt presents with right sided thigh pain.

## 2018-01-17 NOTE — ED Provider Notes (Signed)
[facility]    CSN: [id number] Arrival date & time: 01/17/18  1747     History   Chief Complaint Chief Complaint  Patient presents with  . Leg Pain    HPI Cynthia Becker is a 47 y.o. female.   Cynthia Becker presents with complaints of right thigh pain which has been ongoing for weeks now, worse over rhe past few days. Burning pain. To anterior thigh, hip, proximal thigh and down leg. At times shoots to foot. No numbness. At times her left leg feels numb. No urinary or stool incontinence. Follows with a pain center for chronic back pain. No specific known injury. She states at baseline she cannot stand or walk more than 5 minutes, has not been to a store in 4 years. Hx of sciatica. Takes ibuprofen twice a day and oxycodone 10mg  three times a day. States takes gabapentin prn for anxiety, has not been taking regularly. Pain is worse at night with certain positions. Hx of anemia, anxiety, arthritis, asthma, depression, headache, htn, diabetes, obesity.     ROS per HPI.      Past Medical History:  Diagnosis Date  . Anemia    iv iron treatment dec 2014/ see oncology for this last in dec 2014  . Anxiety   . Arthritis    knee, hands  . Asthma    seasonal related/ albuterol used when uri  . Depression   . H/O dizziness    Hx  . Headache(784.0)    otc med prn  . High blood pressure   . SVD (spontaneous vaginal delivery)    x 3    Patient Active Problem List   Diagnosis Date Noted  . Morbid obesity with BMI of 60.0-69.9, adult ([place]) 09/14/2014  . Iron deficiency anemia due to chronic blood loss 09/14/2014  . CN (constipation) 09/14/2014  . Postoperative state 04/12/2014  . Uterine fibroid 03/17/2014  . DUB (dysfunctional uterine bleeding) 01/27/2014  . Iron deficiency anemia due to chronic blood loss 04/09/2013  . 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 Menometrorrhagia 04/09/2013  . Left hand pain 09/18/2011  . High blood pressure   . ANEMIA, IRON DEFICIENCY  08/06/2010  . CHOLECYSTECTOMY, HX OF 04/09/2010  . HYPERLIPIDEMIA 02/13/2010  . DEGENERATIVE DISC DISEASE, LUMBOSACRAL SPINE W/RADICULOPATHY 11/01/2009  . BACK PAIN, LUMBAR, WITH RADICULOPATHY 10/30/2009  . TRIGGER FINGER 02/03/2009  . CARPAL TUNNEL SYNDROME 12/21/2008  . HEADACHE 11/09/2008  . DEPRESSION 10/04/2008  . URI 08/24/2008  . INSOMNIA 08/24/2008  . ASTHMA 06/06/2008  . OBESITY 05/24/2008  . ESSENTIAL HYPERTENSION 05/24/2008    Past Surgical History:  Procedure Laterality Date  . BILATERAL SALPINGECTOMY Bilateral 04/12/2014   Procedure: BILATERAL SALPINGECTOMY;  Surgeon: Woodroe Mode, MD;  Location: [facility];  Service: Gynecology;  Laterality: Bilateral;  . CARPAL TUNNEL RELEASE  08/30/2011   Procedure: CARPAL TUNNEL RELEASE;  Surgeon: Wynonia Sours, MD;  Location: [facility];  Service: Orthopedics;  Laterality: Right;  . CHOLECYSTECTOMY  10/11   lap choli  . GALLBLADDER SURGERY  04/04/2010  . HYSTEROSCOPY N/A 02/08/2014   Procedure: HYSTEROSCOPY;  Surgeon: Woodroe Mode, MD;  Location: [facility];  Service: Gynecology;  Laterality: N/A;  . HYSTEROSCOPY WITH NOVASURE N/A 02/08/2014   Procedure: ATTEMPTED NOVASURE ABLATION;  Surgeon: Woodroe Mode, MD;  Location: [facility];  Service: Gynecology;  Laterality: N/A;  . INTRAUTERINE DEVICE INSERTION     07/19/2013  . SEPTOPLASTY N/A 09/08/2013   Procedure: SEPTOPLASTY;  Surgeon: Alroy Dust  Simeon Craft, MD;  Location: [facility];  Service: ENT;  Laterality: N/A;  . TONSILLECTOMY AND ADENOIDECTOMY Bilateral 09/08/2013   Procedure: TONSILLECTOMY ;  Surgeon: Ruby Cola, MD;  Location: [facility];  Service: ENT;  Laterality: Bilateral;  . TUBAL LIGATION    . VAGINAL HYSTERECTOMY N/A 04/12/2014   Procedure: LAPAROSCOPIC ASSISTED VAGINAL HYSTERECTOMY;  Surgeon: Woodroe Mode, MD;  Location: [facility];  Service: Gynecology;  Laterality: N/A;  . WISDOM TOOTH EXTRACTION      OB History    Gravida  3   Para  3   Term  3   Preterm  0   AB    0   Living  3     SAB  0   TAB  0   Ectopic  0   Multiple  0   Live Births               Home Medications    Prior to Admission medications   Medication Sig Start Date End Date Taking? Authorizing Provider  Aspirin Effervescent (ALKA-SELTZER PO) Take 2 tablets by mouth daily as needed (cold symptoms).   Yes [provider]  atorvastatin (LIPITOR) 10 MG tablet Take 10 mg by mouth daily.   Yes [provider]  beclomethasone (QVAR) 40 MCG/ACT inhaler Inhale 2 puffs into the lungs 2 (two) times daily.   Yes [provider]  furosemide (LASIX) 20 MG tablet Take 20 mg by mouth daily.   Yes [provider]  gabapentin (NEURONTIN) 100 MG capsule Take 100 mg by mouth 3 (three) times daily.   Yes [provider]  GLIPIZIDE PO Take 4 mg by mouth daily.   Yes [provider]  ibuprofen (ADVIL,MOTRIN) 800 MG tablet Take 800 mg by mouth every 8 (eight) hours as needed for moderate pain.   Yes [provider]  isosorbide-hydrALAZINE (BIDIL) 20-37.5 MG tablet Take 1 tablet by mouth 3 (three) times daily.   Yes [provider]  losartan-hydrochlorothiazide (HYZAAR) 100-25 MG tablet Take 1 tablet by mouth daily.   Yes [provider]  metFORMIN (GLUCOPHAGE) 1000 MG tablet Take 1,000 mg by mouth daily with breakfast.   Yes [provider]  metoprolol tartrate (LOPRESSOR) 25 MG tablet Take 25 mg by mouth daily.   Yes [provider]  oxycodone (OXY-IR) 5 MG capsule Take 5 mg by mouth 3 (three) times daily.   Yes [provider]  verapamil (CALAN) 120 MG tablet Take 120 mg by mouth 2 (two) times daily.   Yes [provider]  cyclobenzaprine (FLEXERIL) 5 MG tablet Take 1 tablet (5 mg total) by mouth at bedtime. 01/17/18   Zigmund Gottron, NP  guaiFENesin (MUCINEX CHEST CONGESTION CHILD) 100 MG/5ML liquid Take 200 mg by mouth 3 (three) times daily as needed for congestion.     [provider]    Family History Family History  Problem Relation Age of Onset  . Diabetes Mother   . Hypertension Father   . Heart disease Unknown   . Diabetes Unknown   . Hypertension Unknown   . Alcohol abuse Unknown     Social History Social History   Tobacco Use  . Smoking status: Former Smoker    Packs/day: 0.25    Types: Cigarettes    Last attempt to quit: 02/11/2016    Years since quitting: 1.9  . Smokeless tobacco: Never Used  Substance Use Topics  . Alcohol use: No  . Drug use: No  Allergies   Patient has no known allergies.   Review of Systems Review of Systems   Physical Exam Triage Vital Signs ED Triage Vitals [01/17/18 1834]  Enc Vitals Group     BP (!) 166/94     Pulse Rate 100     Resp (!) 24     Temp (!) 97.5 F (36.4 C)     Temp Source Oral     SpO2 99 %     Weight      Height      Head Circumference      Peak Flow      Pain Score 7     Pain Loc      Pain Edu?      Excl. in [place]?    No data found.  Updated Vital Signs BP (!) 166/94 (BP Location: Right Arm)   Pulse 100   Temp (!) 97.5 F (36.4 C) (Oral)   Resp (!) 24   LMP 02/13/2014   SpO2 99%    Physical Exam  Constitutional: She is oriented to person, place, and time. She appears well-developed and well-nourished. No distress.  Cardiovascular: Normal rate, regular rhythm and normal heart sounds.  Pulmonary/Chest: Effort normal and breath sounds normal.  Musculoskeletal:       Right hip: She exhibits decreased range of motion and tenderness. She exhibits no swelling.       Right knee: She exhibits decreased range of motion. She exhibits no bony tenderness. Tenderness found.       Right upper leg: She exhibits tenderness. She exhibits no bony tenderness, no swelling and no edema.  Generalized right leg with tenderness; no redness or swelling visualized; very limited exam as patient unable to transfer from chair to exam table; ambulatory into clinic but  wheelchair out of clinic. Gross sensation intact; no specific point tenderness   Neurological: She is alert and oriented to person, place, and time.  Skin: Skin is warm and dry.     UC Treatments / Results  Labs (all labs ordered are listed, but only abnormal results are displayed) Labs Reviewed - No data to display  EKG None  Radiology No results found.  Procedures Procedures (including critical care time)  Medications Ordered in UC Medications - No data to display  Initial Impression / Assessment and Plan / UC Course  I have reviewed the triage vital signs and the nursing notes.  Pertinent labs & imaging results that were available during my care of the patient were reviewed by me and considered in my medical decision making (see chart for details).     Non specific acute on chronic right leg pain. Without point tenderness, redness or swelling; pain has been persistent and gradual, a radiating and burning pain, low suspicion for clot. Encouraged regular use of gabapentin as this may help with pain, flexeril at night. To follow up with PCP as well as pain management this week for recheck. Return precautions provided. Patient verbalized understanding and agreeable to plan.    Final Clinical Impressions(s) / UC Diagnoses   Final diagnoses:  Sciatica of right side     Discharge Instructions     Please take your gabapentin three times a day regularly as this may help with the burning pain.  May use flexeril at night. May cause drowsiness. Please do not take if driving or drinking alcohol.   Please call Monday to follow up with your primary care provider as well as your pain doctor for recheck  of symptoms.    ED Prescriptions    Medication Sig Dispense Auth. Provider   cyclobenzaprine (FLEXERIL) 5 MG tablet Take 1 tablet (5 mg total) by mouth at bedtime. 15 tablet Zigmund Gottron, NP     Controlled Substance Prescriptions [place] Controlled Substance Registry consulted?  Not Applicable   Zigmund Gottron, NP 01/17/18 [phone]

## 2018-01-17 NOTE — Discharge Instructions (Signed)
Please take your gabapentin three times a day regularly as this may help with the burning pain.  May use flexeril at night. May cause drowsiness. Please do not take if driving or drinking alcohol.   Please call Monday to follow up with your primary care provider as well as your pain doctor for recheck of symptoms.

## 2018-04-02 ENCOUNTER — Emergency Department (HOSPITAL_COMMUNITY)
Admission: EM | Admit: 2018-04-02 | Discharge: 2018-04-03 | Disposition: A | Discharge status: Home / Self Care | Payer: Medicaid Other | Attending: Emergency Medicine | Admitting: Emergency Medicine

## 2018-04-02 ENCOUNTER — Encounter (HOSPITAL_COMMUNITY): Payer: Self-pay | Admitting: Emergency Medicine

## 2018-04-02 ENCOUNTER — Other Ambulatory Visit: Payer: Self-pay

## 2018-04-02 DIAGNOSIS — J45909 Unspecified asthma, uncomplicated: Secondary | ICD-10-CM | POA: Diagnosis not present

## 2018-04-02 DIAGNOSIS — Z79899 Other long term (current) drug therapy: Secondary | ICD-10-CM | POA: Insufficient documentation

## 2018-04-02 DIAGNOSIS — M5431 Sciatica, right side: Secondary | ICD-10-CM | POA: Diagnosis not present

## 2018-04-02 DIAGNOSIS — M5432 Sciatica, left side: Secondary | ICD-10-CM | POA: Diagnosis not present

## 2018-04-02 DIAGNOSIS — R030 Elevated blood-pressure reading, without diagnosis of hypertension: Secondary | ICD-10-CM | POA: Insufficient documentation

## 2018-04-02 DIAGNOSIS — Z87891 Personal history of nicotine dependence: Secondary | ICD-10-CM | POA: Insufficient documentation

## 2018-04-02 DIAGNOSIS — Z7984 Long term (current) use of oral hypoglycemic drugs: Secondary | ICD-10-CM | POA: Diagnosis not present

## 2018-04-02 DIAGNOSIS — M79605 Pain in left leg: Secondary | ICD-10-CM | POA: Diagnosis present

## 2018-04-02 NOTE — ED Triage Notes (Addendum)
Patient c/o left thigh pain radiating to left hip x3 weeks. Hx neuropathy and sciatica. Reports taking oxy and morphine for chronic pains.

## 2018-04-03 MED ORDER — HYDROMORPHONE HCL 2 MG/ML IJ SOLN
2.0000 mg | Freq: Once | INTRAMUSCULAR | Status: AC
  Administered 2018-04-03: 2 mg via INTRAMUSCULAR
  Filled 2018-04-03: qty 1

## 2018-04-03 NOTE — ED Provider Notes (Signed)
[facility] DEPT Provider Note: Cynthia Spurling, MD, FACEP  CSN: [id number] MRN: [id number] ARRIVAL: 04/02/18 at [place]: [place]  Leg Pain   HISTORY OF PRESENT ILLNESS  04/03/18 12:57 AM Cynthia Becker is a 47 y.o. female with chronic right-sided sciatic pain treated at [facility]  [facility] management.  She is on chronic oxycodone 10 mg, 150 tablets dispensed monthly, most recently dispensed February 25, 2018.  She was also prescribed Embeda ER 20/0.8, 60 tablets dispensed March 02, 2018.  She is here with 3 weeks of new sciatic type pain on her left side.  It is primarily around her upper anterior thigh.  She describes the pain is excruciating and it is limiting her ability to ambulate.  She uses a walker but is still having difficulty.  Her current pain medications are not adequately treating the pain.  She denies new numbness in the left leg and is not aware of new weakness but is having difficulty moving her leg due to the pain.  She denies injury that precipitated this.   Past Medical History:  Diagnosis Date  . Anemia    iv iron treatment dec 2014/ see oncology for this last in dec 2014  . Anxiety   . Arthritis    knee, hands  . Asthma    seasonal related/ albuterol used when uri  . Depression   . H/O dizziness    Hx  . Headache(784.0)    otc med prn  . High blood pressure   . SVD (spontaneous vaginal delivery)    x 3    Past Surgical History:  Procedure Laterality Date  . BILATERAL SALPINGECTOMY Bilateral 04/12/2014   Procedure: BILATERAL SALPINGECTOMY;  Surgeon: Woodroe Mode, MD;  Location: [facility];  Service: Gynecology;  Laterality: Bilateral;  . CARPAL TUNNEL RELEASE  08/30/2011   Procedure: CARPAL TUNNEL RELEASE;  Surgeon: Wynonia Sours, MD;  Location: [facility];  Service: Orthopedics;  Laterality: Right;  . CHOLECYSTECTOMY  10/11   lap choli  . GALLBLADDER SURGERY  04/04/2010  . HYSTEROSCOPY N/A 02/08/2014     Procedure: HYSTEROSCOPY;  Surgeon: Woodroe Mode, MD;  Location: [facility];  Service: Gynecology;  Laterality: N/A;  . HYSTEROSCOPY WITH NOVASURE N/A 02/08/2014   Procedure: ATTEMPTED NOVASURE ABLATION;  Surgeon: Woodroe Mode, MD;  Location: [facility];  Service: Gynecology;  Laterality: N/A;  . INTRAUTERINE DEVICE INSERTION     07/19/2013  . SEPTOPLASTY N/A 09/08/2013   Procedure: SEPTOPLASTY;  Surgeon: Ruby Cola, MD;  Location: [facility];  Service: ENT;  Laterality: N/A;  . TONSILLECTOMY AND ADENOIDECTOMY Bilateral 09/08/2013   Procedure: TONSILLECTOMY ;  Surgeon: Ruby Cola, MD;  Location: [facility];  Service: ENT;  Laterality: Bilateral;  . TUBAL LIGATION    . VAGINAL HYSTERECTOMY N/A 04/12/2014   Procedure: LAPAROSCOPIC ASSISTED VAGINAL HYSTERECTOMY;  Surgeon: Woodroe Mode, MD;  Location: [facility];  Service: Gynecology;  Laterality: N/A;  . WISDOM TOOTH EXTRACTION      Family History  Problem Relation Age of Onset  . Diabetes Mother   . Hypertension Father   . Heart disease Unknown   . Diabetes Unknown   . Hypertension Unknown   . Alcohol abuse Unknown     Social History   Tobacco Use  . Smoking status: Former Smoker    Packs/day: 0.25    Types: Cigarettes    Last attempt to quit: 02/11/2016    Years since quitting: 2.1  .  Smokeless tobacco: Never Used  Substance Use Topics  . Alcohol use: No  . Drug use: No    Prior to Admission medications   Medication Sig Start Date End Date Taking? Authorizing Provider  Aspirin Effervescent (ALKA-SELTZER PO) Take 2 tablets by mouth daily as needed (cold symptoms).    [provider]  atorvastatin (LIPITOR) 10 MG tablet Take 10 mg by mouth daily.    [provider]  beclomethasone (QVAR) 40 MCG/ACT inhaler Inhale 2 puffs into the lungs 2 (two) times daily.    [provider]  cyclobenzaprine (FLEXERIL) 5 MG tablet Take 1 tablet (5 mg total) by mouth at bedtime. 01/17/18   Zigmund Gottron, NP  furosemide (LASIX)  20 MG tablet Take 20 mg by mouth daily.    [provider]  gabapentin (NEURONTIN) 100 MG capsule Take 100 mg by mouth 3 (three) times daily.    [provider]  GLIPIZIDE PO Take 4 mg by mouth daily.    [provider]  guaiFENesin (MUCINEX CHEST CONGESTION CHILD) 100 MG/5ML liquid Take 200 mg by mouth 3 (three) times daily as needed for congestion.    [provider]  ibuprofen (ADVIL,MOTRIN) 800 MG tablet Take 800 mg by mouth every 8 (eight) hours as needed for moderate pain.    [provider]  isosorbide-hydrALAZINE (BIDIL) 20-37.5 MG tablet Take 1 tablet by mouth 3 (three) times daily.    [provider]  losartan-hydrochlorothiazide (HYZAAR) 100-25 MG tablet Take 1 tablet by mouth daily.    [provider]  metFORMIN (GLUCOPHAGE) 1000 MG tablet Take 1,000 mg by mouth daily with breakfast.    [provider]  metoprolol tartrate (LOPRESSOR) 25 MG tablet Take 25 mg by mouth daily.    [provider]  oxycodone (OXY-IR) 5 MG capsule Take 5 mg by mouth 3 (three) times daily.    [provider]  verapamil (CALAN) 120 MG tablet Take 120 mg by mouth 2 (two) times daily.    [provider]    Allergies Patient has no known allergies.   REVIEW OF SYSTEMS  Negative except as noted here or in the History of Present Illness.   PHYSICAL EXAMINATION  Initial Vital Signs Blood pressure (!) 149/90, pulse 99, temperature 98.3 F (36.8 C), temperature source Oral, resp. rate 20, last menstrual period 02/13/2014, SpO2 99 %.  Examination General: Well-developed, well-nourished female in no acute distress; appearance consistent with age of record HENT: normocephalic; atraumatic Eyes: pupils equal, round and reactive to light; extraocular muscles intact Neck: supple Heart: regular rate and rhythm; no murmurs, rubs or gallops Lungs: clear to auscultation bilaterally Abdomen: soft; nondistended;  nontender; no masses or hepatosplenomegaly; bowel sounds present Back: Mild left lumbar tenderness; positive straight leg raise on the left Extremities: No deformity; full range of motion; pulses normal Neurologic: Awake, alert and oriented; motor function intact in all extremities and symmetric but examination limited in left lower extremity due to pain; sensation intact in lower extremities and symmetric; no facial droop Skin: Warm and dry Psychiatric: Normal mood and affect   RESULTS  Summary of this visit's results, reviewed by myself:   EKG Interpretation  Date/Time:    Ventricular Rate:    PR Interval:    QRS Duration:   QT Interval:    QTC Calculation:   R Axis:     Text Interpretation:        Laboratory Studies: No results found for this or any previous visit (  from the past 24 hour(s)). Imaging Studies: No results found.  ED COURSE and MDM  Nursing notes and initial vitals signs, including pulse oximetry, reviewed.  Vitals:   04/02/18 2018  BP: (!) 149/90  Pulse: 99  Resp: 20  Temp: 98.3 F (36.8 C)  TempSrc: Oral  SpO2: 99%   Patient understands that she cannot be given additional prescriptions for narcotics in the ED because she is on a chronic pain contract.  We will give her a dose of Dilaudid here.  She was advised to contact her pain management physician.  She may need an MRI as she has not had an MRI since 2012.  PROCEDURES    ED DIAGNOSES     ICD-10-CM   1. Bilateral sciatica M54.31    M54.32        Shine Scrogham, Jenny Reichmann, MD 04/03/18 [phone]

## 2018-04-03 NOTE — ED Notes (Signed)
Pt blood pressure cuff readjusted for more accurate BP

## 2018-06-10 ENCOUNTER — Ambulatory Visit: Payer: Medicaid Other | Admitting: Podiatry

## 2018-06-22 ENCOUNTER — Ambulatory Visit (INDEPENDENT_AMBULATORY_CARE_PROVIDER_SITE_OTHER): Payer: Medicaid Other

## 2018-06-22 ENCOUNTER — Ambulatory Visit: Payer: Medicaid Other | Admitting: Podiatry

## 2018-06-22 DIAGNOSIS — B351 Tinea unguium: Secondary | ICD-10-CM | POA: Diagnosis not present

## 2018-06-22 DIAGNOSIS — M659 Synovitis and tenosynovitis, unspecified: Secondary | ICD-10-CM

## 2018-06-22 DIAGNOSIS — M79671 Pain in right foot: Secondary | ICD-10-CM

## 2018-06-22 DIAGNOSIS — M7751 Other enthesopathy of right foot: Secondary | ICD-10-CM

## 2018-06-22 DIAGNOSIS — M79676 Pain in unspecified toe(s): Secondary | ICD-10-CM | POA: Diagnosis not present

## 2018-06-22 DIAGNOSIS — E0843 Diabetes mellitus due to underlying condition with diabetic autonomic (poly)neuropathy: Secondary | ICD-10-CM

## 2018-06-23 ENCOUNTER — Other Ambulatory Visit: Payer: Self-pay | Admitting: Podiatry

## 2018-06-23 DIAGNOSIS — M659 Synovitis and tenosynovitis, unspecified: Secondary | ICD-10-CM

## 2018-06-25 NOTE — Progress Notes (Signed)
   SUBJECTIVE 48 year old female with a history of diabetes mellitus presents to office today complaining of elongated, thickened nails that cause pain while ambulating in shoes. She is unable to trim her own nails.   She also notes pain to the dorsal right foot that began 4 months ago. She reports associated sharp pain that radiates into the ankle. She ha snot done anything for treatment. Walking and bearing weight increases the pain. Patient is here for further evaluation and treatment.   Past Medical History:  Diagnosis Date  . Anemia    iv iron treatment dec 2014/ see oncology for this last in dec 2014  . Anxiety   . Arthritis    knee, hands  . Asthma    seasonal related/ albuterol used when uri  . Depression   . H/O dizziness    Hx  . Headache(784.0)    otc med prn  . High blood pressure   . SVD (spontaneous vaginal delivery)    x 3    OBJECTIVE General Patient is awake, alert, and oriented x 3 and in no acute distress. Derm Skin is dry and supple bilateral. Negative open lesions or macerations. Remaining integument unremarkable. Nails are tender, long, thickened and dystrophic with subungual debris, consistent with onychomycosis, 1-5 bilateral. No signs of infection noted. Vasc  DP and PT pedal pulses palpable bilaterally. Temperature gradient within normal limits.  Neuro Epicritic and protective threshold sensation diminished bilaterally.  Musculoskeletal Exam Pain with palpation noted to the medial, anterior and lateral aspects of the right ankle joint. No symptomatic pedal deformities noted bilateral. Muscular strength within normal limits.  Radiographic Exam:  Normal osseous mineralization. Joint spaces preserved. No fracture/dislocation/boney destruction.    ASSESSMENT 1. Diabetes Mellitus w/ peripheral neuropathy 2. Onychomycosis of nail due to dermatophyte bilateral 3. Right ankle synovitis   PLAN OF CARE 1. Patient evaluated today. X-Rays reviewed.  2.  Instructed to maintain good pedal hygiene and foot care. Stressed importance of controlling blood sugar.  3. Mechanical debridement of nails 1-5 bilaterally performed using a nail nipper. Filed with dremel without incident.  4. Injection of 0.5 mLs Celestone Soluspan injected into the right ankle joint.  5. Return to clinic in 3 mos with Dr. Elisha Ponder.     Edrick Kins, DPM [facility]  Dr. Edrick Kins, [address]                                        [place],  [address]                Office [phone]  Fax [phone]

## 2018-07-12 ENCOUNTER — Encounter (HOSPITAL_COMMUNITY): Payer: Self-pay

## 2018-07-12 ENCOUNTER — Encounter (HOSPITAL_COMMUNITY): Payer: Self-pay | Admitting: *Deleted

## 2018-07-12 ENCOUNTER — Emergency Department (HOSPITAL_COMMUNITY)
Admission: EM | Admit: 2018-07-12 | Discharge: 2018-07-12 | Disposition: A | Discharge status: Home / Self Care | Payer: Medicaid Other | Attending: Emergency Medicine | Admitting: Emergency Medicine

## 2018-07-12 ENCOUNTER — Emergency Department (HOSPITAL_COMMUNITY): Payer: Medicaid Other

## 2018-07-12 ENCOUNTER — Other Ambulatory Visit: Payer: Self-pay

## 2018-07-12 ENCOUNTER — Ambulatory Visit (HOSPITAL_COMMUNITY)
Admission: EM | Admit: 2018-07-12 | Discharge: 2018-07-12 | Disposition: A | Discharge status: Home / Self Care | Payer: Medicaid Other | Attending: Internal Medicine | Admitting: Internal Medicine

## 2018-07-12 DIAGNOSIS — Y998 Other external cause status: Secondary | ICD-10-CM | POA: Insufficient documentation

## 2018-07-12 DIAGNOSIS — Z7984 Long term (current) use of oral hypoglycemic drugs: Secondary | ICD-10-CM | POA: Insufficient documentation

## 2018-07-12 DIAGNOSIS — M25552 Pain in left hip: Secondary | ICD-10-CM | POA: Insufficient documentation

## 2018-07-12 DIAGNOSIS — W06XXXA Fall from bed, initial encounter: Secondary | ICD-10-CM | POA: Diagnosis not present

## 2018-07-12 DIAGNOSIS — Z87891 Personal history of nicotine dependence: Secondary | ICD-10-CM | POA: Diagnosis not present

## 2018-07-12 DIAGNOSIS — J45909 Unspecified asthma, uncomplicated: Secondary | ICD-10-CM | POA: Insufficient documentation

## 2018-07-12 DIAGNOSIS — Y939 Activity, unspecified: Secondary | ICD-10-CM | POA: Diagnosis not present

## 2018-07-12 DIAGNOSIS — I10 Essential (primary) hypertension: Secondary | ICD-10-CM | POA: Diagnosis not present

## 2018-07-12 DIAGNOSIS — Y92003 Bedroom of unspecified non-institutional (private) residence as the place of occurrence of the external cause: Secondary | ICD-10-CM | POA: Insufficient documentation

## 2018-07-12 DIAGNOSIS — Z79899 Other long term (current) drug therapy: Secondary | ICD-10-CM | POA: Diagnosis not present

## 2018-07-12 DIAGNOSIS — E785 Hyperlipidemia, unspecified: Secondary | ICD-10-CM | POA: Insufficient documentation

## 2018-07-12 DIAGNOSIS — W19XXXA Unspecified fall, initial encounter: Secondary | ICD-10-CM

## 2018-07-12 MED ORDER — OXYCODONE HCL 5 MG PO TABS
10.0000 mg | ORAL_TABLET | Freq: Once | ORAL | Status: AC
  Administered 2018-07-12: 10 mg via ORAL
  Filled 2018-07-12: qty 2

## 2018-07-12 NOTE — Discharge Instructions (Signed)
Continue taking your pain medication as prescribed at home.  You can add 400mg  ibuprofen every 6 hours.  Use ice and heat alternating 20 minutes on, 20 minutes off.  Please follow-up with orthopedic doctor by calling tomorrow.  Your CT did not show any fracture, however did show mild to moderate arthritis.  There also could be soft tissue injury.  Please return the emergency department you develop any new or worsening symptoms.

## 2018-07-12 NOTE — ED Notes (Signed)
Pt ambulated from bed to chair, from chair to commode with assistance from NT and significant other. Pt then only required minimal assist when returning from restroom back to room. Pt was able to bear weight on left leg and walk 5 steps on her own with complaint of leg pain.

## 2018-07-12 NOTE — ED Triage Notes (Addendum)
The pt fell 3 weeks  Ago  Pain in her lt hip and down to her foot since this past Thursday  Mobility worse  lmp none.  Hx of chronic back pain  She takes narcotics regularly every day

## 2018-07-12 NOTE — ED Notes (Signed)
Pt returned from xray walked to the br with much help

## 2018-07-12 NOTE — ED Notes (Signed)
To x-ray

## 2018-07-12 NOTE — ED Provider Notes (Signed)
[facility]    CSN: [id number] Arrival date & time: 07/12/18  1732     History   Chief Complaint Chief Complaint  Patient presents with  . Fall    HPI Cynthia Becker is a 48 y.o. female.   48 year old female with multiple medical problems including morbid obesity presents to urgent care due to left hip pain.  The patient fell off of her bed 2 weeks ago which is approximately 3 feet from the floor.  She has been favoring her left side since then but is now barely able to bear weight.  She has to hold the wall to walk and she reports that her hip pain has become significantly worse.  She denies weakness in her legs, numbness or tingling in her feet.     Past Medical History:  Diagnosis Date  . Anemia    iv iron treatment dec 2014/ see oncology for this last in dec 2014  . Anxiety   . Arthritis    knee, hands  . Asthma    seasonal related/ albuterol used when uri  . Depression   . H/O dizziness    Hx  . Headache(784.0)    otc med prn  . High blood pressure   . SVD (spontaneous vaginal delivery)    x 3    Patient Active Problem List   Diagnosis Date Noted  . Morbid obesity with BMI of 60.0-69.9, adult ([place]) 09/14/2014  . Iron deficiency anemia due to chronic blood loss 09/14/2014  . CN (constipation) 09/14/2014  . Postoperative state 04/12/2014  . Uterine fibroid 03/17/2014  . DUB (dysfunctional uterine bleeding) 01/27/2014  . Iron deficiency anemia due to chronic blood loss 04/09/2013  . 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 Menometrorrhagia 04/09/2013  . Left hand pain 09/18/2011  . High blood pressure   . ANEMIA, IRON DEFICIENCY 08/06/2010  . CHOLECYSTECTOMY, HX OF 04/09/2010  . HYPERLIPIDEMIA 02/13/2010  . DEGENERATIVE DISC DISEASE, LUMBOSACRAL SPINE W/RADICULOPATHY 11/01/2009  . BACK PAIN, LUMBAR, WITH RADICULOPATHY 10/30/2009  . TRIGGER FINGER 02/03/2009  . CARPAL TUNNEL SYNDROME 12/21/2008  . HEADACHE 11/09/2008  . DEPRESSION 10/04/2008    . URI 08/24/2008  . INSOMNIA 08/24/2008  . ASTHMA 06/06/2008  . OBESITY 05/24/2008  . ESSENTIAL HYPERTENSION 05/24/2008    Past Surgical History:  Procedure Laterality Date  . BILATERAL SALPINGECTOMY Bilateral 04/12/2014   Procedure: BILATERAL SALPINGECTOMY;  Surgeon: Woodroe Mode, MD;  Location: [facility];  Service: Gynecology;  Laterality: Bilateral;  . CARPAL TUNNEL RELEASE  08/30/2011   Procedure: CARPAL TUNNEL RELEASE;  Surgeon: Wynonia Sours, MD;  Location: [facility];  Service: Orthopedics;  Laterality: Right;  . CHOLECYSTECTOMY  10/11   lap choli  . GALLBLADDER SURGERY  04/04/2010  . HYSTEROSCOPY N/A 02/08/2014   Procedure: HYSTEROSCOPY;  Surgeon: Woodroe Mode, MD;  Location: [facility];  Service: Gynecology;  Laterality: N/A;  . HYSTEROSCOPY WITH NOVASURE N/A 02/08/2014   Procedure: ATTEMPTED NOVASURE ABLATION;  Surgeon: Woodroe Mode, MD;  Location: [facility];  Service: Gynecology;  Laterality: N/A;  . INTRAUTERINE DEVICE INSERTION     07/19/2013  . SEPTOPLASTY N/A 09/08/2013   Procedure: SEPTOPLASTY;  Surgeon: Ruby Cola, MD;  Location: [facility];  Service: ENT;  Laterality: N/A;  . TONSILLECTOMY AND ADENOIDECTOMY Bilateral 09/08/2013   Procedure: TONSILLECTOMY ;  Surgeon: Ruby Cola, MD;  Location: [facility];  Service: ENT;  Laterality: Bilateral;  . TUBAL LIGATION    . VAGINAL HYSTERECTOMY N/A 04/12/2014  Procedure: LAPAROSCOPIC ASSISTED VAGINAL HYSTERECTOMY;  Surgeon: Woodroe Mode, MD;  Location: [facility];  Service: Gynecology;  Laterality: N/A;  . WISDOM TOOTH EXTRACTION      OB History    Gravida  3   Para  3   Term  3   Preterm  0   AB  0   Living  3     SAB  0   TAB  0   Ectopic  0   Multiple  0   Live Births               Home Medications    Prior to Admission medications   Medication Sig Start Date End Date Taking? Authorizing Provider  ACCU-CHEK FASTCLIX LANCETS MISC USE TO CHECK BLOOD SUGAR TWICE DAILY 03/23/18   [provider]  ACCU-CHEK GUIDE test strip USE 1 Orange 03/30/18   [provider]  Aspirin Effervescent (ALKA-SELTZER PO) Take 2 tablets by mouth daily as needed (cold symptoms).    [provider]  atorvastatin (LIPITOR) 10 MG tablet Take 10 mg by mouth daily.    [provider]  beclomethasone (QVAR) 40 MCG/ACT inhaler Inhale 2 puffs into the lungs 2 (two) times daily.    [provider]  cyclobenzaprine (FLEXERIL) 5 MG tablet Take 1 tablet (5 mg total) by mouth at bedtime. 01/17/18   Zigmund Gottron, NP  furosemide (LASIX) 20 MG tablet Take 20 mg by mouth daily.    [provider]  gabapentin (NEURONTIN) 100 MG capsule Take 100 mg by mouth 3 (three) times daily.    [provider]  gabapentin (NEURONTIN) 600 MG tablet Take 600 mg by mouth 3 (three) times daily. 03/26/18   [provider]  GLIPIZIDE PO Take 4 mg by mouth daily.    [provider]  guaiFENesin (MUCINEX CHEST CONGESTION CHILD) 100 MG/5ML liquid Take 200 mg by mouth 3 (three) times daily as needed for congestion.    [provider]  ibuprofen (ADVIL,MOTRIN) 800 MG tablet Take 800 mg by mouth every 8 (eight) hours as needed for moderate pain.    [provider]  isosorbide-hydrALAZINE (BIDIL) 20-37.5 MG tablet Take 1 tablet by mouth 3 (three) times daily.    [provider]  lidocaine (LIDODERM) 5 % USE 1 PATCH TOPICALLY ONCE DAILY (MAY WEAR UP TO 12 HOURS) 03/22/18   [provider]  losartan-hydrochlorothiazide (HYZAAR) 100-25 MG tablet Take 1 tablet by mouth daily.    [provider]  metFORMIN (GLUCOPHAGE) 1000 MG tablet Take 1,000 mg by mouth daily with breakfast.    [provider]  metoprolol tartrate (LOPRESSOR) 25 MG tablet Take 25 mg by mouth daily.    [provider]  oxycodone (OXY-IR) 5 MG capsule Take 5 mg by mouth 3 (three) times daily.    [provider]  verapamil  (CALAN) 120 MG tablet Take 120 mg by mouth 2 (two) times daily.    [provider]    Family History Family History  Problem Relation Age of Onset  . Diabetes Mother   . Hypertension Father   . Heart disease Other   . Diabetes Other   . Hypertension Other   . Alcohol abuse Other     Social History Social History   Tobacco Use  . Smoking status: Former Smoker    Packs/day: 0.25    Types: Cigarettes    Last attempt to quit: 02/11/2016    Years  since quitting: 2.4  . Smokeless tobacco: Never Used  Substance Use Topics  . Alcohol use: No  . Drug use: No     Allergies   Patient has no known allergies.   Review of Systems Review of Systems  Constitutional: Negative for chills and fever.  HENT: Negative for sore throat and tinnitus.   Eyes: Negative for redness.  Respiratory: Negative for cough and shortness of breath.   Cardiovascular: Negative for chest pain and palpitations.  Gastrointestinal: Negative for abdominal pain, diarrhea, nausea and vomiting.  Genitourinary: Negative for dysuria, frequency and urgency.  Musculoskeletal: Positive for gait problem. Negative for myalgias.  Skin: Negative for rash.       No lesions  Neurological: Negative for weakness.  Hematological: Does not bruise/bleed easily.  Psychiatric/Behavioral: Negative for suicidal ideas.     Physical Exam Triage Vital Signs ED Triage Vitals  Enc Vitals Group     BP 07/12/18 1908 140/81     Pulse --      Resp 07/12/18 1908 18     Temp 07/12/18 1908 98.3 F (36.8 C)     Temp Source 07/12/18 1908 Oral     SpO2 07/12/18 1908 96 %     Weight 07/12/18 1907 (!) 430 lb (195 kg)     Height --      Head Circumference --      Peak Flow --      Pain Score 07/12/18 1906 10     Pain Loc --      Pain Edu? --      Excl. in [place]? --    No data found.  Updated Vital Signs BP 140/81 (BP Location: Right Arm)   Temp 98.3 F (36.8 C) (Oral)   Resp 18   Wt (!) 195 kg   LMP 02/13/2014    SpO2 96%   BMI 71.56 kg/m   Visual Acuity Right Eye Distance:   Left Eye Distance:   Bilateral Distance:    Right Eye Near:   Left Eye Near:    Bilateral Near:     Physical Exam Vitals signs and nursing note reviewed.  Constitutional:      General: She is not in acute distress.    Appearance: She is well-developed.  HENT:     Head: Normocephalic and atraumatic.  Eyes:     General: No scleral icterus.    Conjunctiva/sclera: Conjunctivae normal.     Pupils: Pupils are equal, round, and reactive to light.  Neck:     Musculoskeletal: Normal range of motion and neck supple.     Thyroid: No thyromegaly.     Vascular: No JVD.     Trachea: No tracheal deviation.  Cardiovascular:     Rate and Rhythm: Normal rate and regular rhythm.     Heart sounds: Normal heart sounds. No murmur. No friction rub. No gallop.   Pulmonary:     Effort: Pulmonary effort is normal.     Breath sounds: Normal breath sounds.  Abdominal:     General: Bowel sounds are normal. There is no distension.     Palpations: Abdomen is soft.     Tenderness: There is no abdominal tenderness.  Musculoskeletal: Normal range of motion.  Lymphadenopathy:     Cervical: No cervical adenopathy.  Skin:    General: Skin is warm and dry.  Neurological:     Mental Status: She is alert and oriented to person, place, and time.     Cranial Nerves: No cranial  nerve deficit.  Psychiatric:        Behavior: Behavior normal.        Thought Content: Thought content normal.        Judgment: Judgment normal.      UC Treatments / Results  Labs (all labs ordered are listed, but only abnormal results are displayed) Labs Reviewed - No data to display  EKG None  Radiology No results found.  Procedures Procedures (including critical care time)  Medications Ordered in UC Medications - No data to display  Initial Impression / Assessment and Plan / UC Course  I have reviewed the triage vital signs and the nursing  notes.  Pertinent labs & imaging results that were available during my care of the patient were reviewed by me and considered in my medical decision making (see chart for details).     The patient is unable to bear weight on her left side.  Body habitus limits x-ray visibility.  Also patient likely need CT of her pelvis to definitively rule out fracture.  Advised evaluation in the emergency department for radiography and assistance arranging for home safety/Occupational Therapy.  Final Clinical Impressions(s) / UC Diagnoses   Final diagnoses:  Hip pain, acute, left   Discharge Instructions   None    ED Prescriptions    None     Controlled Substance Prescriptions [place] Controlled Substance Registry consulted? Not Applicable   Harrie Foreman, MD 07/12/18 [phone]

## 2018-07-12 NOTE — ED Triage Notes (Signed)
Pt cc she states she fell and injured her left hip and thigh . Pt fell x 2 weeks ago. Pt was about 36 inches off the floor when she fell. Pt states she's unable to stand on her left  at all.

## 2018-07-12 NOTE — ED Provider Notes (Signed)
[facility] EMERGENCY DEPARTMENT Provider Note   CSN: [id number] Arrival date & time: 07/12/18  1952     History   Chief Complaint Chief Complaint  Patient presents with  . Fall    HPI Cynthia Becker is a 48 y.o. female with history of morbid obesity, high blood pressure, chronic back pain who presents with a one-week history of left hip pain after fall.  Patient reports she was sitting on the edge of the bed in the middle the night and slipped and fell on her left hip in a split type slide.  She reports she has been walking on it, however it has become progressively worse and not responding to her normal pain medication, which is oxycodone 4 times daily and morphine twice daily.  She takes this for chronic back pain.  Patient denies any numbness or tingling.  She has intermittent pain shooting down her leg, but no pain in her knee or lower leg.  She denies any new back pain.  She did not hit her head or lose consciousness when this fall happened.  She was sent here from urgent care for CT considering her body habitus limits x-ray.  HPI  Past Medical History:  Diagnosis Date  . Anemia    iv iron treatment dec 2014/ see oncology for this last in dec 2014  . Anxiety   . Arthritis    knee, hands  . Asthma    seasonal related/ albuterol used when uri  . Depression   . H/O dizziness    Hx  . Headache(784.0)    otc med prn  . High blood pressure   . SVD (spontaneous vaginal delivery)    x 3    Patient Active Problem List   Diagnosis Date Noted  . Morbid obesity with BMI of 60.0-69.9, adult ([place]) 09/14/2014  . Iron deficiency anemia due to chronic blood loss 09/14/2014  . CN (constipation) 09/14/2014  . Postoperative state 04/12/2014  . Uterine fibroid 03/17/2014  . DUB (dysfunctional uterine bleeding) 01/27/2014  . Iron deficiency anemia due to chronic blood loss 04/09/2013  . 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 Menometrorrhagia 04/09/2013  . Left hand  pain 09/18/2011  . High blood pressure   . ANEMIA, IRON DEFICIENCY 08/06/2010  . CHOLECYSTECTOMY, HX OF 04/09/2010  . HYPERLIPIDEMIA 02/13/2010  . DEGENERATIVE DISC DISEASE, LUMBOSACRAL SPINE W/RADICULOPATHY 11/01/2009  . BACK PAIN, LUMBAR, WITH RADICULOPATHY 10/30/2009  . TRIGGER FINGER 02/03/2009  . CARPAL TUNNEL SYNDROME 12/21/2008  . HEADACHE 11/09/2008  . DEPRESSION 10/04/2008  . URI 08/24/2008  . INSOMNIA 08/24/2008  . ASTHMA 06/06/2008  . OBESITY 05/24/2008  . ESSENTIAL HYPERTENSION 05/24/2008    Past Surgical History:  Procedure Laterality Date  . BILATERAL SALPINGECTOMY Bilateral 04/12/2014   Procedure: BILATERAL SALPINGECTOMY;  Surgeon: Woodroe Mode, MD;  Location: [facility];  Service: Gynecology;  Laterality: Bilateral;  . CARPAL TUNNEL RELEASE  08/30/2011   Procedure: CARPAL TUNNEL RELEASE;  Surgeon: Wynonia Sours, MD;  Location: [facility];  Service: Orthopedics;  Laterality: Right;  . CHOLECYSTECTOMY  10/11   lap choli  . GALLBLADDER SURGERY  04/04/2010  . HYSTEROSCOPY N/A 02/08/2014   Procedure: HYSTEROSCOPY;  Surgeon: Woodroe Mode, MD;  Location: [facility];  Service: Gynecology;  Laterality: N/A;  . HYSTEROSCOPY WITH NOVASURE N/A 02/08/2014   Procedure: ATTEMPTED NOVASURE ABLATION;  Surgeon: Woodroe Mode, MD;  Location: [facility];  Service: Gynecology;  Laterality: N/A;  . INTRAUTERINE DEVICE INSERTION  07/19/2013  . SEPTOPLASTY N/A 09/08/2013   Procedure: SEPTOPLASTY;  Surgeon: Ruby Cola, MD;  Location: [facility];  Service: ENT;  Laterality: N/A;  . TONSILLECTOMY AND ADENOIDECTOMY Bilateral 09/08/2013   Procedure: TONSILLECTOMY ;  Surgeon: Ruby Cola, MD;  Location: [facility];  Service: ENT;  Laterality: Bilateral;  . TUBAL LIGATION    . VAGINAL HYSTERECTOMY N/A 04/12/2014   Procedure: LAPAROSCOPIC ASSISTED VAGINAL HYSTERECTOMY;  Surgeon: Woodroe Mode, MD;  Location: [facility];  Service: Gynecology;  Laterality: N/A;  . WISDOM TOOTH EXTRACTION       OB  History    Gravida  3   Para  3   Term  3   Preterm  0   AB  0   Living  3     SAB  0   TAB  0   Ectopic  0   Multiple  0   Live Births               Home Medications    Prior to Admission medications   Medication Sig Start Date End Date Taking? Authorizing Provider  beclomethasone (QVAR) 40 MCG/ACT inhaler Inhale 2 puffs into the lungs 2 (two) times daily as needed (shortness of breath).    Yes [provider]  cyclobenzaprine (FLEXERIL) 10 MG tablet Take 10 mg by mouth at bedtime. 06/18/18  Yes [provider]  furosemide (LASIX) 20 MG tablet Take 20 mg by mouth daily.   Yes [provider]  gabapentin (NEURONTIN) 600 MG tablet Take 600 mg by mouth 2 (two) times daily.  03/26/18  Yes [provider]  ibuprofen (ADVIL,MOTRIN) 800 MG tablet Take 800 mg by mouth every 8 (eight) hours as needed for moderate pain.   Yes [provider]  lidocaine (LIDODERM) 5 % Place 1 patch onto the skin daily as needed (pain).  03/22/18  Yes [provider]  lisinopril-hydrochlorothiazide (PRINZIDE,ZESTORETIC) 20-12.5 MG tablet Take 1 tablet by mouth 2 (two) times daily. 07/10/18  Yes [provider]  metFORMIN (GLUCOPHAGE-XR) 750 MG 24 hr tablet Take 750 mg by mouth daily after supper.   Yes [provider]  metoprolol tartrate (LOPRESSOR) 25 MG tablet Take 25 mg by mouth 2 (two) times daily.    Yes [provider]  morphine (MS CONTIN) 15 MG 12 hr tablet Take 15 mg by mouth every 12 (twelve) hours. 07/01/18  Yes [provider]  Oxycodone HCl 10 MG TABS Take 10 mg by mouth 5 (five) times daily.    Yes [provider]  ACCU-CHEK FASTCLIX LANCETS MISC USE TO CHECK BLOOD SUGAR TWICE DAILY 03/23/18   [provider]  ACCU-CHEK GUIDE test strip USE 1 [place] 03/30/18   [provider]  cyclobenzaprine (FLEXERIL) 5 MG tablet Take 1 tablet (5 mg total) by mouth at  bedtime. Patient not taking: Reported on 07/12/2018 01/17/18   Zigmund Gottron, NP    Family History Family History  Problem Relation Age of Onset  . Diabetes Mother   . Hypertension Father   . Heart disease Other   . Diabetes Other   . Hypertension Other   . Alcohol abuse Other     Social History Social History   Tobacco Use  . Smoking status: Former Smoker    Packs/day: 0.25    Types: Cigarettes    Last attempt to quit: 02/11/2016    Years since quitting: 2.4  . Smokeless tobacco: Never Used  Substance Use Topics  .  Alcohol use: No  . Drug use: No     Allergies   Patient has no known allergies.   Review of Systems Review of Systems  Constitutional: Negative for chills and fever.  HENT: Negative for facial swelling and sore throat.   Respiratory: Negative for shortness of breath.   Cardiovascular: Negative for chest pain.  Gastrointestinal: Negative for abdominal pain, nausea and vomiting.  Genitourinary: Negative for dysuria.  Musculoskeletal: Positive for arthralgias. Negative for back pain.  Skin: Negative for rash and wound.  Neurological: Negative for numbness and headaches.  Psychiatric/Behavioral: The patient is not nervous/anxious.      Physical Exam Updated Vital Signs BP 115/77   Pulse 93   Temp 98.1 F (36.7 C) (Oral)   Resp 18   Ht 5\' 5"  (1.651 m)   Wt (!) 181.4 kg   LMP 02/13/2014   SpO2 93%   BMI 66.56 kg/m   Physical Exam Vitals signs and nursing note reviewed.  Constitutional:      General: She is not in acute distress.    Appearance: She is well-developed. She is not diaphoretic.     Comments: Morbidly obese  HENT:     Head: Normocephalic and atraumatic.     Mouth/Throat:     Pharynx: No oropharyngeal exudate.  Eyes:     General: No scleral icterus.       Right eye: No discharge.        Left eye: No discharge.     Conjunctiva/sclera: Conjunctivae normal.     Pupils: Pupils are equal, round, and reactive to light.  Neck:      Musculoskeletal: Normal range of motion and neck supple.     Thyroid: No thyromegaly.  Cardiovascular:     Rate and Rhythm: Normal rate and regular rhythm.     Heart sounds: Normal heart sounds. No murmur. No friction rub. No gallop.   Pulmonary:     Effort: Pulmonary effort is normal. No respiratory distress.     Breath sounds: Normal breath sounds. No stridor. No wheezing or rales.  Abdominal:     General: Bowel sounds are normal. There is no distension.     Palpations: Abdomen is soft.     Tenderness: There is no abdominal tenderness. There is no guarding or rebound.  Musculoskeletal:     Comments: Anterior and posterior left hip pain as well as lateral tenderness which is more mild; no pain on palpation of the knee, lower leg, or ankle; DP pulses intact bilaterally; limited range of motion of the hip due to pain No midline cervical, thoracic, or lumbar tenderness  Lymphadenopathy:     Cervical: No cervical adenopathy.  Skin:    General: Skin is warm and dry.     Coloration: Skin is not pale.     Findings: No rash.  Neurological:     Mental Status: She is alert.     Coordination: Coordination normal.      ED Treatments / Results  Labs (all labs ordered are listed, but only abnormal results are displayed) Labs Reviewed - No data to display  EKG None  Radiology Ct Hip Left Wo Contrast  Result Date: 07/12/2018 CLINICAL DATA:  Mechanical fall with left hip pain EXAM: CT OF THE LEFT HIP WITHOUT CONTRAST TECHNIQUE: Multidetector CT imaging of the left hip was performed according to the standard protocol. Multiplanar CT image reconstructions were also generated. COMPARISON:  02/08/2014 radiograph FINDINGS: Bones/Joint/Cartilage No acute fracture or dislocation is evident. Pubic symphysis  and rami are intact. Mild to moderate joint space narrowing with subchondral cysts and sclerosis. No large hip effusion. Ligaments Suboptimally assessed by CT. Muscles and Tendons No  significant atrophy. Soft tissues Negative IMPRESSION: Mild to moderate arthritis of the left hip without acute osseous abnormality. Electronically Signed   By: Donavan Foil M.D.   On: 07/12/2018 21:34    Procedures Procedures (including critical care time)  Medications Ordered in ED Medications  oxyCODONE (Oxy IR/ROXICODONE) immediate release tablet 10 mg (10 mg Oral Given 07/12/18 2031)     Initial Impression / Assessment and Plan / ED Course  I have reviewed the triage vital signs and the nursing notes.  Pertinent labs & imaging results that were available during my care of the patient were reviewed by me and considered in my medical decision making (see chart for details).     Patient presenting with a one-week history of left hip pain after low mechanism fall that was more like a split.  CT shows mild to moderate arthritis of the left hip without acute osseous abnormality.  Low suspicion of septic joint.  Range of motion is limited, but pain mostly in the abductor area.  Suspect soft tissue injury.  Arthritis may be a factor.  Advised ice and heat.  Add ibuprofen to patient's chronic pain regimen.  Follow-up with orthopedics for further evaluation.  Will give patient a walker and cane for more assistance at home with ambulation, although she is able to ambulate with minimal assistance here.  Return precautions discussed.  Patient understands and agrees with plan.  Patient vitals stable throughout ED course and discharged in satisfactory condition.  Final Clinical Impressions(s) / ED Diagnoses   Final diagnoses:  Left hip pain  Fall, initial encounter    ED Discharge Orders         Ordered    For home use only DME Cane     07/12/18 2306    For home use only DME Walker     07/12/18 2306           Frederica Kuster, PA-C 07/12/18 2333    Maudie Flakes, MD 07/12/18 2336

## 2018-08-03 ENCOUNTER — Ambulatory Visit (INDEPENDENT_AMBULATORY_CARE_PROVIDER_SITE_OTHER): Payer: Medicaid Other | Admitting: Orthopaedic Surgery

## 2018-08-03 ENCOUNTER — Encounter (INDEPENDENT_AMBULATORY_CARE_PROVIDER_SITE_OTHER): Payer: Self-pay | Admitting: Orthopaedic Surgery

## 2018-08-03 VITALS — Ht 65.0 in | Wt >= 6400 oz

## 2018-08-03 DIAGNOSIS — W19XXXA Unspecified fall, initial encounter: Secondary | ICD-10-CM | POA: Diagnosis not present

## 2018-08-03 DIAGNOSIS — M1612 Unilateral primary osteoarthritis, left hip: Secondary | ICD-10-CM

## 2018-08-03 DIAGNOSIS — Z6841 Body Mass Index (BMI) 40.0 and over, adult: Secondary | ICD-10-CM | POA: Diagnosis not present

## 2018-08-03 DIAGNOSIS — M25552 Pain in left hip: Secondary | ICD-10-CM | POA: Diagnosis not present

## 2018-08-03 NOTE — Progress Notes (Signed)
Office Visit Note   Patient: Cynthia Becker           Date of Birth: 25-Jan-1971           MRN: [id number] Visit Date: 08/03/2018              Requested by: No referring provider defined for this encounter. PCP: System, Pcp Not In   Assessment & Plan: Visit Diagnoses:  1. Pain of left hip joint   2. Unilateral primary osteoarthritis, left hip     Plan: I will try cane in her opposite hand to offload her hip.  We can see if Dr. Ernestina Patches can consider an intra-articular steroid injection of the left hip if he can get down to it.  We will see her back in 4 weeks to see how she is done overall.  At that point she wants me to at least consider reimaging her lumbar spine to see if pain management needs to do something different for her lumbar spine.  It would have to likely be an MRI of her spine.  At her next visit I would like if possible to obtain an AP and lateral of the lumbar spine.  Follow-Up Instructions: Return in about 4 weeks (around 08/31/2018).   Orders:  No orders of the defined types were placed in this encounter.  No orders of the defined types were placed in this encounter.     Procedures: No procedures performed   Clinical Data: No additional findings.   Subjective: Chief Complaint  Patient presents with  . Left Hip - Pain    ER visit 07/12/2018 s/p fall  The patient is sent to me from the emergency room to evaluate and treat left hip pain.  She actually fell in early January injuring her left hip.  She had difficulty with pain in the groin and ambulating with weakness in that leg.  She is someone who does weigh 400 pounds and is on disability as it relates to her lumbar spine and does go to pain management for this.  Her BMI is 66.5.  She did have a CT scan that I reviewed of her left hip and it showed no fracture.  There was arthritic changes in the hip joint itself which is only mild to moderate.  She does complain of left hip and groin pain.  She also complains of  low back pain.  She knows we are only exploring the hip today.  HPI  Review of Systems She currently denies any fever chills nausea or vomiting she denies any chest pain  Objective: Vital Signs: Ht 5\' 5"  (1.651 m)   Wt (!) 400 lb (181.4 kg)   LMP 02/13/2014   BMI 66.56 kg/m   Physical Exam She is alert orient x3 and in no acute distress Ortho Exam Examination of her right hip is normal examination of her left hip shows pain in the groin with internal and external rotation.  Is certainly difficult exam given her morbid obesity. Specialty Comments:  No specialty comments available.  Imaging: No results found. The CT scan is reviewed extensively of her left hip and it does show mild to moderate arthritic changes in the hip joint itself with slight narrowing and some cystic changes.  I see no evidence of fracture around the pelvis femoral neck or intertrochanteric area.  The CT scan was done 2 weeks after her fall.  PMFS History: Patient Active Problem List   Diagnosis Date Noted  . Morbid obesity  with BMI of 60.0-69.9, adult ([place]) 09/14/2014  . Iron deficiency anemia due to chronic blood loss 09/14/2014  . CN (constipation) 09/14/2014  . Postoperative state 04/12/2014  . Uterine fibroid 03/17/2014  . DUB (dysfunctional uterine bleeding) 01/27/2014  . Iron deficiency anemia due to chronic blood loss 04/09/2013  . 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 Menometrorrhagia 04/09/2013  . Left hand pain 09/18/2011  . High blood pressure   . ANEMIA, IRON DEFICIENCY 08/06/2010  . CHOLECYSTECTOMY, HX OF 04/09/2010  . HYPERLIPIDEMIA 02/13/2010  . DEGENERATIVE DISC DISEASE, LUMBOSACRAL SPINE W/RADICULOPATHY 11/01/2009  . BACK PAIN, LUMBAR, WITH RADICULOPATHY 10/30/2009  . TRIGGER FINGER 02/03/2009  . CARPAL TUNNEL SYNDROME 12/21/2008  . HEADACHE 11/09/2008  . DEPRESSION 10/04/2008  . URI 08/24/2008  . INSOMNIA 08/24/2008  . ASTHMA 06/06/2008  . OBESITY 05/24/2008  . ESSENTIAL  HYPERTENSION 05/24/2008   Past Medical History:  Diagnosis Date  . Anemia    iv iron treatment dec 2014/ see oncology for this last in dec 2014  . Anxiety   . Arthritis    knee, hands  . Asthma    seasonal related/ albuterol used when uri  . Depression   . H/O dizziness    Hx  . Headache(784.0)    otc med prn  . High blood pressure   . SVD (spontaneous vaginal delivery)    x 3    Family History  Problem Relation Age of Onset  . Diabetes Mother   . Hypertension Father   . Heart disease Other   . Diabetes Other   . Hypertension Other   . Alcohol abuse Other     Past Surgical History:  Procedure Laterality Date  . BILATERAL SALPINGECTOMY Bilateral 04/12/2014   Procedure: BILATERAL SALPINGECTOMY;  Surgeon: Woodroe Mode, MD;  Location: [facility];  Service: Gynecology;  Laterality: Bilateral;  . CARPAL TUNNEL RELEASE  08/30/2011   Procedure: CARPAL TUNNEL RELEASE;  Surgeon: Wynonia Sours, MD;  Location: [facility];  Service: Orthopedics;  Laterality: Right;  . CHOLECYSTECTOMY  10/11   lap choli  . GALLBLADDER SURGERY  04/04/2010  . HYSTEROSCOPY N/A 02/08/2014   Procedure: HYSTEROSCOPY;  Surgeon: Woodroe Mode, MD;  Location: [facility];  Service: Gynecology;  Laterality: N/A;  . HYSTEROSCOPY WITH NOVASURE N/A 02/08/2014   Procedure: ATTEMPTED NOVASURE ABLATION;  Surgeon: Woodroe Mode, MD;  Location: [facility];  Service: Gynecology;  Laterality: N/A;  . INTRAUTERINE DEVICE INSERTION     07/19/2013  . SEPTOPLASTY N/A 09/08/2013   Procedure: SEPTOPLASTY;  Surgeon: Ruby Cola, MD;  Location: [facility];  Service: ENT;  Laterality: N/A;  . TONSILLECTOMY AND ADENOIDECTOMY Bilateral 09/08/2013   Procedure: TONSILLECTOMY ;  Surgeon: Ruby Cola, MD;  Location: [facility];  Service: ENT;  Laterality: Bilateral;  . TUBAL LIGATION    . VAGINAL HYSTERECTOMY N/A 04/12/2014   Procedure: LAPAROSCOPIC ASSISTED VAGINAL HYSTERECTOMY;  Surgeon: Woodroe Mode, MD;  Location: [facility];  Service:  Gynecology;  Laterality: N/A;  . WISDOM TOOTH EXTRACTION     Social History   Occupational History  . Not on file  Tobacco Use  . Smoking status: Former Smoker    Packs/day: 0.25    Types: Cigarettes    Last attempt to quit: 02/11/2016    Years since quitting: 2.4  . Smokeless tobacco: Never Used  Substance and Sexual Activity  . Alcohol use: No  . Drug use: No  . Sexual activity: Yes    Birth control/protection: Surgical

## 2018-08-04 ENCOUNTER — Other Ambulatory Visit (INDEPENDENT_AMBULATORY_CARE_PROVIDER_SITE_OTHER): Payer: Self-pay

## 2018-08-04 DIAGNOSIS — M25552 Pain in left hip: Secondary | ICD-10-CM

## 2018-08-17 ENCOUNTER — Encounter: Payer: Self-pay | Admitting: Family Medicine

## 2018-08-17 ENCOUNTER — Ambulatory Visit (INDEPENDENT_AMBULATORY_CARE_PROVIDER_SITE_OTHER): Payer: Medicaid Other | Admitting: Family Medicine

## 2018-08-17 VITALS — BP 152/98 | HR 82 | Temp 98.2°F | Resp 16 | Ht 66.0 in | Wt >= 6400 oz

## 2018-08-17 DIAGNOSIS — J4540 Moderate persistent asthma, uncomplicated: Secondary | ICD-10-CM

## 2018-08-17 DIAGNOSIS — I10 Essential (primary) hypertension: Secondary | ICD-10-CM

## 2018-08-17 DIAGNOSIS — G4700 Insomnia, unspecified: Secondary | ICD-10-CM | POA: Diagnosis not present

## 2018-08-17 DIAGNOSIS — R600 Localized edema: Secondary | ICD-10-CM

## 2018-08-17 DIAGNOSIS — E119 Type 2 diabetes mellitus without complications: Secondary | ICD-10-CM | POA: Diagnosis not present

## 2018-08-17 DIAGNOSIS — Z6841 Body Mass Index (BMI) 40.0 and over, adult: Secondary | ICD-10-CM

## 2018-08-17 LAB — POCT GLYCOSYLATED HEMOGLOBIN (HGB A1C): Hemoglobin A1C: 6.3 % — AB (ref 4.0–5.6)

## 2018-08-17 LAB — POCT URINALYSIS DIPSTICK
Bilirubin, UA: NEGATIVE
Blood, UA: NEGATIVE
Glucose, UA: NEGATIVE
Ketones, UA: NEGATIVE
Leukocytes, UA: NEGATIVE
Nitrite, UA: NEGATIVE
Protein, UA: POSITIVE — AB
Spec Grav, UA: 1.01 (ref 1.010–1.025)
Urobilinogen, UA: 0.2 E.U./dL
pH, UA: 7 (ref 5.0–8.0)

## 2018-08-17 MED ORDER — BECLOMETHASONE DIPROPIONATE 40 MCG/ACT IN AERS: 2.0000 | INHALATION_SPRAY | Freq: Two times a day (BID) | RESPIRATORY_TRACT | 5 refills | Status: DC | PRN

## 2018-08-17 MED ORDER — CYCLOBENZAPRINE HCL 10 MG PO TABS: 10.0000 mg | ORAL_TABLET | Freq: Every day | ORAL | 0 refills | Status: DC

## 2018-08-17 MED ORDER — FUROSEMIDE 20 MG PO TABS: 20.0000 mg | ORAL_TABLET | Freq: Every day | ORAL | 5 refills | Status: DC

## 2018-08-17 NOTE — Progress Notes (Signed)
Patient [facility]  New Patient Encounter Provider: Lanae Boast, [place]    MBW:[id number]  SVX:[id number]  DOB - 26-Sep-1970  SUBJECTIVE:   Cynthia Becker, is a 48 y.o. female who presents to establish care with this clinic.   Current problems/concerns:   Patient states that she was seen previously by a family medicine practice in downtown [place]. Patient states that she was recently started on verapamil. Has been taking metoprolol for several years. Patient states that she is compliant with medications. Patient denies side effects of the medications.  Patient states that she stopped the lisinopril- HCTZ 2 weeks prior.  Patient is seen by pain management and is prescribed MS contin, Oxycodone due to back pain.  Patient reports FBS of 125. Patient states that she was previously on glipizide and metformin and  was having hypoglycemic episodes.  Patient states that she has been told that she needs to have gastric bypass.  She is nervous about committing to a surgery at the present time, but will continue to contemplate. No Known Allergies Past Medical History:  Diagnosis Date  . Anemia    iv iron treatment dec 2014/ see oncology for this last in dec 2014  . Anxiety   . Arthritis    knee, hands  . Asthma    seasonal related/ albuterol used when uri  . Depression   . H/O dizziness    Hx  . Headache(784.0)    otc med prn  . High blood pressure   . SVD (spontaneous vaginal delivery)    x 3   Current Outpatient Medications on File Prior to Visit  Medication Sig Dispense Refill  . ACCU-CHEK FASTCLIX LANCETS MISC USE TO CHECK BLOOD SUGAR TWICE DAILY  11  . ACCU-CHEK GUIDE test strip USE 1 STRIP TWICE DAILY  11  . gabapentin (NEURONTIN) 600 MG tablet Take 600 mg by mouth 2 (two) times daily.   3  . ibuprofen (ADVIL,MOTRIN) 800 MG tablet Take 800 mg by mouth every 8 (eight) hours as needed for moderate pain.    Marland Kitchen lidocaine (LIDODERM) 5 %  Place 1 patch onto the skin daily as needed (pain).   5  . metFORMIN (GLUCOPHAGE-XR) 750 MG 24 hr tablet Take 750 mg by mouth daily after supper.    . metoprolol tartrate (LOPRESSOR) 25 MG tablet Take 25 mg by mouth 2 (two) times daily.     Marland Kitchen morphine (MS CONTIN) 15 MG 12 hr tablet Take 15 mg by mouth every 12 (twelve) hours.    . Oxycodone HCl 10 MG TABS Take 10 mg by mouth 5 (five) times daily.     . verapamil (CALAN-SR) 180 MG CR tablet Take 180 mg by mouth at bedtime.     No current facility-administered medications on file prior to visit.    Family History  Problem Relation Age of Onset  . Diabetes Mother   . Hypertension Father   . Heart disease Other   . Diabetes Other   . Hypertension Other   . Alcohol abuse Other    Social History   Socioeconomic History  . Marital status: Married    Spouse name: Not on file  . Number of children: Not on file  . Years of education: Not on file  . Highest education level: Not on file  Occupational History  . Not on file  Social Needs  . Financial resource strain: Not on file  . Food insecurity:    Worry: Not  on file    Inability: Not on file  . Transportation needs:    Medical: Not on file    Non-medical: Not on file  Tobacco Use  . Smoking status: Current Some Day Smoker    Packs/day: 0.25    Types: Cigarettes    Last attempt to quit: 02/11/2016    Years since quitting: 2.5  . Smokeless tobacco: Never Used  Substance and Sexual Activity  . Alcohol use: No  . Drug use: No  . Sexual activity: Yes    Birth control/protection: Surgical  Lifestyle  . Physical activity:    Days per week: Not on file    Minutes per session: Not on file  . Stress: Not on file  Relationships  . Social connections:    Talks on phone: Not on file    Gets together: Not on file    Attends religious service: Not on file    Active member of club or organization: Not on file    Attends meetings of clubs or organizations: Not on file    Relationship  status: Not on file  . Intimate partner violence:    Fear of current or ex partner: Not on file    Emotionally abused: Not on file    Physically abused: Not on file    Forced sexual activity: Not on file  Other Topics Concern  . Not on file  Social History Narrative  . Not on file    ROS   OBJECTIVE:    BP (!) 152/98 (BP Location: Right Arm, Patient Position: Sitting, Cuff Size: Large) Comment: manually  Pulse 82   Temp 98.2 F (36.8 C) (Oral)   Resp 16   Ht 5\' 6"  (1.676 m)   Wt (!) 454 lb (205.9 kg)   LMP 02/13/2014   SpO2 98%   BMI 73.28 kg/m   Physical Exam   ASSESSMENT/PLAN:  1. Hypertension, unspecified type - Urinalysis Dipstick - furosemide (LASIX) 20 MG tablet; Take 1 tablet (20 mg total) by mouth daily.  Dispense: 30 tablet; Refill: 5  2. Type 2 diabetes mellitus without complication, unspecified whether long term insulin use (HCC) - Urinalysis Dipstick - HgB A1c  3. Insomnia, unspecified type - cyclobenzaprine (FLEXERIL) 10 MG tablet; Take 1 tablet (10 mg total) by mouth at bedtime.  Dispense: 30 tablet; Refill: 0  4. Morbid obesity with BMI of 60.0-69.9, adult ([place])  5. Moderate persistent asthma, unspecified whether complicated - beclomethasone (QVAR) 40 MCG/ACT inhaler; Inhale 2 puffs into the lungs 2 (two) times daily as needed (shortness of breath).  Dispense: 1 Inhaler; Refill: 5  6. Bilateral lower extremity edema - Compression stockings Refilled medications today.  We will continue to monitor.  Discussed weight loss through diet and exercise.  Also discussed checking with insurance for requirements of weight loss surgery.  We also discussed taking medications as prescribed, sleep hygiene, and managing her diabetes.  Discussed eliminating triggers for asthma.   Return in about 2 weeks (around 08/31/2018) for HTN.  The patient was given clear instructions to go to ER or return to medical center if symptoms don't improve, worsen or new problems  develop. The patient verbalized understanding. The patient was told to call to get lab results if they haven't heard anything in the next week.     Ms. Doug Sou. Nathaneil Canary, FNP-BC [facility] [address] [place], [place] [address] [phone]

## 2018-08-17 NOTE — Patient Instructions (Signed)

## 2018-08-20 ENCOUNTER — Ambulatory Visit (INDEPENDENT_AMBULATORY_CARE_PROVIDER_SITE_OTHER): Payer: Medicaid Other | Admitting: Physical Medicine and Rehabilitation

## 2018-08-20 ENCOUNTER — Encounter (INDEPENDENT_AMBULATORY_CARE_PROVIDER_SITE_OTHER): Payer: Self-pay | Admitting: Physical Medicine and Rehabilitation

## 2018-08-20 ENCOUNTER — Ambulatory Visit (INDEPENDENT_AMBULATORY_CARE_PROVIDER_SITE_OTHER): Payer: Self-pay

## 2018-08-20 DIAGNOSIS — M25552 Pain in left hip: Secondary | ICD-10-CM | POA: Diagnosis not present

## 2018-08-20 NOTE — Progress Notes (Signed)
 .  Numeric Pain Rating Scale and Functional Assessment Average Pain 10   In the last MONTH (on 0-10 scale) has pain interfered with the following?  1. General activity like being  able to carry out your everyday physical activities such as walking, climbing stairs, carrying groceries, or moving a chair?  Rating(8)   -Dye Allergies.  

## 2018-08-20 NOTE — Progress Notes (Signed)
Cynthia Becker - 48 y.o. female MRN [id number]  Date of birth: 1970/12/09  Office Visit Note: Visit Date: 08/20/2018 PCP: Lanae Boast, FNP Referred by: Mcarthur Rossetti*  Subjective: Chief Complaint  Patient presents with  . Left Hip - Pain   HPI: Cynthia Becker is a 48 y.o. female who comes in today At the request of Dr. Jean Rosenthal for diagnostic and hopefully therapeutic left hip anesthetic arthrogram.  Patient has a known chronic history of low back pain which has been treated in the past conservatively.  No history of back surgery.  She now is having left hip and groin pain that really over the last 2 months is gotten significantly worse.  Is worse with walking and moving.  She does use an ibuprofen.  She rates her pain as a 10 out of 10.  Unfortunately patient is morbidly obese with weight at 454 pounds.  ROS Otherwise per HPI.  Assessment & Plan: Visit Diagnoses:  1. Pain in left hip     Plan: Findings:  Diagnostic and hopefully therapeutic anesthetic hip arthrogram.  Patient did have significant decrease in symptoms during the anesthetic phase.    Meds & Orders: No orders of the defined types were placed in this encounter.   Orders Placed This Encounter  Procedures  . Large Joint Inj: L hip joint  . XR C-ARM NO REPORT    Follow-up: No follow-ups on file.   Procedures: Large Joint Inj: L hip joint on 08/20/2018 3:47 PM Indications: pain and diagnostic evaluation Details: 22 G needle, anterior approach  Arthrogram: Yes  Medications: 80 mg triamcinolone acetonide 40 MG/ML; 3 mL bupivacaine 0.5 % Outcome: tolerated well, no immediate complications  Arthrogram demonstrated excellent flow of contrast throughout the joint surface without extravasation or obvious defect.  The patient had relief of symptoms during the anesthetic phase of the injection.  Procedure, treatment alternatives, risks and benefits explained, specific risks discussed. Consent was  given by the patient. Immediately prior to procedure a time out was called to verify the correct patient, procedure, equipment, support staff and site/side marked as required. Patient was prepped and draped in the usual sterile fashion.      No notes on file   Clinical History: No specialty comments available.   She reports that she has been smoking cigarettes. She has been smoking about 0.25 packs per day. She has never used smokeless tobacco.  Recent Labs    08/17/18 1336  HGBA1C 6.3*    Objective:  VS:  HT:    WT:   BMI:     BP:   HR: bpm  TEMP: ( )  RESP:  Physical Exam  Ortho Exam Imaging: Xr C-arm No Report  Result Date: 08/20/2018 Please see Notes tab for imaging impression.   Past Medical/Family/Surgical/Social History: Medications & Allergies reviewed per EMR, new medications updated. Patient Active Problem List   Diagnosis Date Noted  . Morbid obesity with BMI of 60.0-69.9, adult ([place]) 09/14/2014  . Iron deficiency anemia due to chronic blood loss 09/14/2014  . CN (constipation) 09/14/2014  . Postoperative state 04/12/2014  . Uterine fibroid 03/17/2014  . DUB (dysfunctional uterine bleeding) 01/27/2014  . Iron deficiency anemia due to chronic blood loss 04/09/2013  . 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 Menometrorrhagia 04/09/2013  . Left hand pain 09/18/2011  . High blood pressure   . ANEMIA, IRON DEFICIENCY 08/06/2010  . CHOLECYSTECTOMY, HX OF 04/09/2010  . HYPERLIPIDEMIA 02/13/2010  . DEGENERATIVE DISC DISEASE, LUMBOSACRAL  SPINE W/RADICULOPATHY 11/01/2009  . BACK PAIN, LUMBAR, WITH RADICULOPATHY 10/30/2009  . TRIGGER FINGER 02/03/2009  . CARPAL TUNNEL SYNDROME 12/21/2008  . HEADACHE 11/09/2008  . DEPRESSION 10/04/2008  . URI 08/24/2008  . INSOMNIA 08/24/2008  . ASTHMA 06/06/2008  . OBESITY 05/24/2008  . ESSENTIAL HYPERTENSION 05/24/2008   Past Medical History:  Diagnosis Date  . Anemia    iv iron treatment dec 2014/ see oncology for this  last in dec 2014  . Anxiety   . Arthritis    knee, hands  . Asthma    seasonal related/ albuterol used when uri  . Depression   . H/O dizziness    Hx  . Headache(784.0)    otc med prn  . High blood pressure   . SVD (spontaneous vaginal delivery)    x 3   Family History  Problem Relation Age of Onset  . Diabetes Mother   . Hypertension Father   . Heart disease Other   . Diabetes Other   . Hypertension Other   . Alcohol abuse Other    Past Surgical History:  Procedure Laterality Date  . ABDOMINAL HYSTERECTOMY  03/13/2014   total   . BILATERAL SALPINGECTOMY Bilateral 04/12/2014   Procedure: BILATERAL SALPINGECTOMY;  Surgeon: Woodroe Mode, MD;  Location: [facility];  Service: Gynecology;  Laterality: Bilateral;  . CARPAL TUNNEL RELEASE  08/30/2011   Procedure: CARPAL TUNNEL RELEASE;  Surgeon: Wynonia Sours, MD;  Location: [facility];  Service: Orthopedics;  Laterality: Right;  . CHOLECYSTECTOMY  10/11   lap choli  . GALLBLADDER SURGERY  04/04/2010  . HYSTEROSCOPY N/A 02/08/2014   Procedure: HYSTEROSCOPY;  Surgeon: Woodroe Mode, MD;  Location: [facility];  Service: Gynecology;  Laterality: N/A;  . HYSTEROSCOPY WITH NOVASURE N/A 02/08/2014   Procedure: ATTEMPTED NOVASURE ABLATION;  Surgeon: Woodroe Mode, MD;  Location: [facility];  Service: Gynecology;  Laterality: N/A;  . INTRAUTERINE DEVICE INSERTION     07/19/2013  . SEPTOPLASTY N/A 09/08/2013   Procedure: SEPTOPLASTY;  Surgeon: Ruby Cola, MD;  Location: [facility];  Service: ENT;  Laterality: N/A;  . TONSILLECTOMY AND ADENOIDECTOMY Bilateral 09/08/2013   Procedure: TONSILLECTOMY ;  Surgeon: Ruby Cola, MD;  Location: [facility];  Service: ENT;  Laterality: Bilateral;  . TUBAL LIGATION    . VAGINAL HYSTERECTOMY N/A 04/12/2014   Procedure: LAPAROSCOPIC ASSISTED VAGINAL HYSTERECTOMY;  Surgeon: Woodroe Mode, MD;  Location: [facility];  Service: Gynecology;  Laterality: N/A;  . WISDOM TOOTH EXTRACTION     Social History    Occupational History  . Not on file  Tobacco Use  . Smoking status: Current Some Day Smoker    Packs/day: 0.25    Types: Cigarettes    Last attempt to quit: 02/11/2016    Years since quitting: 2.5  . Smokeless tobacco: Never Used  Substance and Sexual Activity  . Alcohol use: No  . Drug use: No  . Sexual activity: Yes    Birth control/protection: Surgical

## 2018-08-21 MED ORDER — BUPIVACAINE HCL 0.5 % IJ SOLN
3.0000 mL | INTRAMUSCULAR | Status: AC | PRN
  Administered 2018-08-20: 3 mL via INTRA_ARTICULAR

## 2018-08-21 MED ORDER — TRIAMCINOLONE ACETONIDE 40 MG/ML IJ SUSP
80.0000 mg | INTRAMUSCULAR | Status: AC | PRN
  Administered 2018-08-20: 80 mg via INTRA_ARTICULAR

## 2018-08-28 ENCOUNTER — Ambulatory Visit: Payer: Medicaid Other | Admitting: Family Medicine

## 2018-08-31 ENCOUNTER — Ambulatory Visit (INDEPENDENT_AMBULATORY_CARE_PROVIDER_SITE_OTHER): Payer: Medicaid Other | Admitting: Orthopaedic Surgery

## 2018-08-31 ENCOUNTER — Encounter (INDEPENDENT_AMBULATORY_CARE_PROVIDER_SITE_OTHER): Payer: Self-pay | Admitting: Orthopaedic Surgery

## 2018-08-31 ENCOUNTER — Telehealth: Payer: Self-pay

## 2018-08-31 ENCOUNTER — Ambulatory Visit: Payer: Medicaid Other | Admitting: Family Medicine

## 2018-08-31 DIAGNOSIS — M25552 Pain in left hip: Secondary | ICD-10-CM | POA: Diagnosis not present

## 2018-08-31 DIAGNOSIS — M1612 Unilateral primary osteoarthritis, left hip: Secondary | ICD-10-CM

## 2018-08-31 NOTE — Progress Notes (Signed)
The patient is a morbidly obese 48 year old female well-known to me.  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  She does weigh 454 pounds.  She did have an intra-articular steroid injection just over a week ago by Dr. Ernestina Patches under direct fluoroscopy and her left hip.  This gave her about 80% relief and she is doing well overall.  She understands the severity of arthritis in her left hip but also the severity of her weight and morbid obesity that can definitely affect her health severely.  She does tolerate me putting her left hip through motion although it is still painful with internal X rotation due to the severity of her arthritis.  At this point I would like to refer her to Dr. Leafy Ro in the bariatric clinic at [facility] as well as Dr. Greer Pickerel with [facility] surgery who is a surgeon who specializes in weight loss type of surgery.  She would like to see both of these clinics to get better information about what she can do to lose weight.  From my standpoint, I can see her back in 6 months to see how she is done from weight loss standpoint and to consider potentially another injection down the road.  All question concerns were answered and addressed.

## 2018-09-01 ENCOUNTER — Other Ambulatory Visit: Payer: Self-pay

## 2018-09-01 ENCOUNTER — Telehealth: Payer: Self-pay

## 2018-09-01 ENCOUNTER — Other Ambulatory Visit (INDEPENDENT_AMBULATORY_CARE_PROVIDER_SITE_OTHER): Payer: Self-pay

## 2018-09-01 DIAGNOSIS — R634 Abnormal weight loss: Secondary | ICD-10-CM

## 2018-09-01 DIAGNOSIS — J4540 Moderate persistent asthma, uncomplicated: Secondary | ICD-10-CM

## 2018-09-01 NOTE — Telephone Encounter (Signed)
Insurance is still not covering Qvar and a script for Flovent needs to be sent or change to different medication

## 2018-09-03 MED ORDER — FLUTICASONE PROPIONATE HFA 110 MCG/ACT IN AERO: 1.0000 | INHALATION_SPRAY | Freq: Two times a day (BID) | RESPIRATORY_TRACT | 12 refills | Status: DC

## 2018-09-11 ENCOUNTER — Encounter: Payer: Self-pay | Admitting: Family Medicine

## 2018-09-11 ENCOUNTER — Ambulatory Visit (INDEPENDENT_AMBULATORY_CARE_PROVIDER_SITE_OTHER): Payer: Medicaid Other | Admitting: Family Medicine

## 2018-09-11 ENCOUNTER — Other Ambulatory Visit: Payer: Self-pay

## 2018-09-11 DIAGNOSIS — I10 Essential (primary) hypertension: Secondary | ICD-10-CM

## 2018-09-11 DIAGNOSIS — J4540 Moderate persistent asthma, uncomplicated: Secondary | ICD-10-CM

## 2018-09-11 MED ORDER — FUROSEMIDE 40 MG PO TABS: 40.0000 mg | ORAL_TABLET | Freq: Every day | ORAL | 3 refills | Status: DC

## 2018-09-11 MED ORDER — POTASSIUM CHLORIDE ER 10 MEQ PO TBCR: 10.0000 meq | EXTENDED_RELEASE_TABLET | Freq: Every day | ORAL | 2 refills | Status: DC

## 2018-09-11 MED ORDER — MONTELUKAST SODIUM 10 MG PO TABS: 10.0000 mg | ORAL_TABLET | Freq: Every day | ORAL | 3 refills | Status: DC

## 2018-09-11 MED ORDER — LORATADINE 10 MG PO TABS: 10.0000 mg | ORAL_TABLET | Freq: Every day | ORAL | 11 refills | Status: DC

## 2018-09-11 NOTE — Progress Notes (Signed)
Virtual Visit via Telephone Note  I connected with Cynthia Becker on 09/11/18 at  3:20 PM EDT by telephone and verified that I am speaking with the correct person using two identifiers.   I discussed the limitations, risks, security and privacy concerns of performing an evaluation and management service by telephone and the availability of in person appointments. I also discussed with the patient that there may be a patient responsible charge related to this service. The patient expressed understanding and agreed to proceed.   History of Present Illness: Cynthia Becker with  has a past medical history of Anemia, Anxiety, Arthritis, Asthma, Depression, H/O dizziness, Headache(784.0), High blood pressure, and SVD (spontaneous vaginal delivery).  She states that she has had a sore throat that is resolving. History of seasonal allergies. Has not been on medications for this. She denies fevers, chills, night sweats, or SOB. She reports that her legs continue to swell. Reports compliance with her medications.  She does not consistently check her Bp at home. She is unsure of the last reading but states that systolic was in the 454U.     Observations/Objective:  Patient with a hoarse voice, however she reports feeling well.   Assessment and Plan: 1. Moderate persistent asthma, unspecified whether complicated Started singulair and loratidine for allergies and asthma. Patient reports taking flovent and has an albuterol inhaler.  - montelukast (SINGULAIR) 10 MG tablet; Take 1 tablet (10 mg total) by mouth at bedtime.  Dispense: 30 tablet; Refill: 3 - loratadine (CLARITIN) 10 MG tablet; Take 1 tablet (10 mg total) by mouth daily.  Dispense: 30 tablet; Refill: 11  2. Hypertension, unspecified type Increase lasix from 20 mg to 40 mg.  - furosemide (LASIX) 40 MG tablet; Take 1 tablet (40 mg total) by mouth daily.  Dispense: 30 tablet; Refill: 3   Follow Up Instructions: Patient to follow up in 4 weeks.  She was instructed to call the clinic if she develops fever, SOB, or a cough that persists. We discussed hand washing, using hand sanitizer when soap and water are not available, only going out when absolutely necessary, and social distancing. Explained to patient that she is immunocompromised and will need to take precautions during this time.     I discussed the assessment and treatment plan with the patient. The patient was provided an opportunity to ask questions and all were answered. The patient agreed with the plan and demonstrated an understanding of the instructions.   The patient was advised to call back or seek an in-person evaluation if the symptoms worsen or if the condition fails to improve as anticipated.  I provided 15 minutes of non-face-to-face time during this encounter.   Lanae Boast, FNP

## 2018-09-11 NOTE — Telephone Encounter (Signed)
Error

## 2018-09-21 ENCOUNTER — Ambulatory Visit: Payer: Medicaid Other | Admitting: Podiatry

## 2018-09-28 ENCOUNTER — Telehealth: Payer: Self-pay

## 2018-09-28 NOTE — Telephone Encounter (Signed)
[facility] wants to know if it's okay for patient to take Lactulose before they prescribe it.

## 2018-09-28 NOTE — Telephone Encounter (Signed)
She has a history of hypertension and is currently taking lasix. She will need to have her electrolytes monitored for hypokalemia.

## 2018-09-29 NOTE — Telephone Encounter (Signed)
Left a vm for patient to callback 

## 2018-09-29 NOTE — Telephone Encounter (Signed)
Patient notified of the above message

## 2018-10-12 ENCOUNTER — Emergency Department (HOSPITAL_COMMUNITY): Payer: Medicaid Other

## 2018-10-12 ENCOUNTER — Encounter (HOSPITAL_COMMUNITY): Payer: Self-pay

## 2018-10-12 ENCOUNTER — Other Ambulatory Visit: Payer: Self-pay

## 2018-10-12 ENCOUNTER — Emergency Department (HOSPITAL_COMMUNITY)
Admission: EM | Admit: 2018-10-12 | Discharge: 2018-10-12 | Disposition: A | Discharge status: Home / Self Care | Payer: Medicaid Other | Attending: Emergency Medicine | Admitting: Emergency Medicine

## 2018-10-12 DIAGNOSIS — I10 Essential (primary) hypertension: Secondary | ICD-10-CM | POA: Insufficient documentation

## 2018-10-12 DIAGNOSIS — J45901 Unspecified asthma with (acute) exacerbation: Secondary | ICD-10-CM | POA: Insufficient documentation

## 2018-10-12 DIAGNOSIS — Z7984 Long term (current) use of oral hypoglycemic drugs: Secondary | ICD-10-CM | POA: Insufficient documentation

## 2018-10-12 DIAGNOSIS — E119 Type 2 diabetes mellitus without complications: Secondary | ICD-10-CM | POA: Insufficient documentation

## 2018-10-12 DIAGNOSIS — Z79899 Other long term (current) drug therapy: Secondary | ICD-10-CM | POA: Insufficient documentation

## 2018-10-12 DIAGNOSIS — R0981 Nasal congestion: Secondary | ICD-10-CM | POA: Insufficient documentation

## 2018-10-12 DIAGNOSIS — H6692 Otitis media, unspecified, left ear: Secondary | ICD-10-CM | POA: Diagnosis not present

## 2018-10-12 DIAGNOSIS — R0602 Shortness of breath: Secondary | ICD-10-CM | POA: Diagnosis present

## 2018-10-12 DIAGNOSIS — R519 Headache, unspecified: Secondary | ICD-10-CM

## 2018-10-12 DIAGNOSIS — R51 Headache: Secondary | ICD-10-CM | POA: Diagnosis not present

## 2018-10-12 DIAGNOSIS — H6690 Otitis media, unspecified, unspecified ear: Secondary | ICD-10-CM

## 2018-10-12 DIAGNOSIS — F17210 Nicotine dependence, cigarettes, uncomplicated: Secondary | ICD-10-CM | POA: Insufficient documentation

## 2018-10-12 MED ORDER — PREDNISONE 20 MG PO TABS
60.0000 mg | ORAL_TABLET | Freq: Once | ORAL | Status: AC
  Administered 2018-10-12: 60 mg via ORAL
  Filled 2018-10-12: qty 3

## 2018-10-12 MED ORDER — FLUTICASONE PROPIONATE HFA 44 MCG/ACT IN AERO
2.0000 | INHALATION_SPRAY | Freq: Two times a day (BID) | RESPIRATORY_TRACT | Status: DC
  Administered 2018-10-12: 2 via RESPIRATORY_TRACT
  Filled 2018-10-12: qty 10.6

## 2018-10-12 MED ORDER — PREDNISONE 20 MG PO TABS: 40.0000 mg | ORAL_TABLET | Freq: Every day | ORAL | 0 refills | Status: AC

## 2018-10-12 MED ORDER — AMOXICILLIN-POT CLAVULANATE 875-125 MG PO TABS: 1.0000 | ORAL_TABLET | Freq: Two times a day (BID) | ORAL | 0 refills | Status: AC

## 2018-10-12 MED ORDER — ACETAMINOPHEN 325 MG PO TABS
650.0000 mg | ORAL_TABLET | Freq: Once | ORAL | Status: AC
  Administered 2018-10-12: 650 mg via ORAL
  Filled 2018-10-12: qty 2

## 2018-10-12 NOTE — ED Triage Notes (Signed)
Pt is c/o SOB that started yesterday with associated HA, pt reports that she has had a productive cough with yellow sputum and congestion. Pt denies fever, v/d. Pt denies any exposure to any persons with covid 19

## 2018-10-12 NOTE — ED Triage Notes (Signed)
Pt denies pain at this time and is able to speak in full sentences and does not have labored breathing

## 2018-10-12 NOTE — Discharge Instructions (Addendum)
Followup with your doctor in 2-5 days in regards to your asthma exacerbation.   -A prescription has been sent to your pharmacy for prednisone.  This is a steroid to take for your asthma exacerbation.  -An antibiotic has been sent to your pharmacy for Augmentin.  This is for an ear infection.  Please take as prescribed.  -Your blood pressure was high today also. You need to follow up with your primary care doctor to have it rechecked and possibly adjust your medications.  1. Medications: albuterol, prednisone, usual home medications Use albuterol either 2 puffs with your inhaler or via a neb machine every 4 hr scheduled for 24hr then every 4 hr as needed. Take the steroid prednisone as prescribed once daily for 4 more days.  2. Treatment: rest, drink plenty of fluids, continue OTC antihistamine    Return to the emergency department if you become short of breath, your albuterol inhaler did not relieve symptoms, you are having chest pain associated with difficulty breathing, or any symptoms that are concerning to you.

## 2018-10-12 NOTE — ED Notes (Signed)
Bed: WTR9 Expected date:  Expected time:  Means of arrival:  Comments: 

## 2018-10-12 NOTE — ED Notes (Signed)
Pt given water and tolerated well

## 2018-10-12 NOTE — ED Provider Notes (Signed)
[facility] DEPT Provider Note   CSN: [id number] Arrival date & time: 10/12/18  1748    History   Chief Complaint Chief Complaint  Patient presents with  . Shortness of Breath  . Headache    HPI Cynthia Becker is a 48 y.o. female with medical history significant for anxiety, asthma, high blood pressure and presented to emergency department today with chief complaint of headache, cough, congestion x 3 days. Pt reports she has had productive cough with yellow sputum. She has nasal congestion and states her ears feel full. She has used her albuterol inhaler without symptom relief. Pt has used her albuterol inhaler and sinus medication without relief.  Pt is also reporting an intermittent headache. It is located on the left side of her head and feels like her usual headaches. She denies sudden onset, visual changes, neck pain, fever, nausea, vomiting, diarrhea. She denies any sick contacts. Pt denies known contact with lab confirmed positive for covid-19. History provided by patient with additional history obtained from chart review.       Past Medical History:  Diagnosis Date  . Anemia    iv iron treatment dec 2014/ see oncology for this last in dec 2014  . Anxiety   . Arthritis    knee, hands  . Asthma    seasonal related/ albuterol used when uri  . Depression   . H/O dizziness    Hx  . Headache(784.0)    otc med prn  . High blood pressure   . SVD (spontaneous vaginal delivery)    x 3    Patient Active Problem List   Diagnosis Date Noted  . Morbid obesity with BMI of 60.0-69.9, adult ([place]) 09/14/2014  . Iron deficiency anemia due to chronic blood loss 09/14/2014  . CN (constipation) 09/14/2014  . Postoperative state 04/12/2014  . Uterine fibroid 03/17/2014  . DUB (dysfunctional uterine bleeding) 01/27/2014  . Iron deficiency anemia due to chronic blood loss 04/09/2013  . 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 Menometrorrhagia 04/09/2013   . Left hand pain 09/18/2011  . High blood pressure   . ANEMIA, IRON DEFICIENCY 08/06/2010  . CHOLECYSTECTOMY, HX OF 04/09/2010  . HYPERLIPIDEMIA 02/13/2010  . DEGENERATIVE DISC DISEASE, LUMBOSACRAL SPINE W/RADICULOPATHY 11/01/2009  . BACK PAIN, LUMBAR, WITH RADICULOPATHY 10/30/2009  . TRIGGER FINGER 02/03/2009  . CARPAL TUNNEL SYNDROME 12/21/2008  . HEADACHE 11/09/2008  . DEPRESSION 10/04/2008  . URI 08/24/2008  . INSOMNIA 08/24/2008  . ASTHMA 06/06/2008  . OBESITY 05/24/2008  . ESSENTIAL HYPERTENSION 05/24/2008    Past Surgical History:  Procedure Laterality Date  . ABDOMINAL HYSTERECTOMY  03/13/2014   total   . BILATERAL SALPINGECTOMY Bilateral 04/12/2014   Procedure: BILATERAL SALPINGECTOMY;  Surgeon: Woodroe Mode, MD;  Location: [facility];  Service: Gynecology;  Laterality: Bilateral;  . CARPAL TUNNEL RELEASE  08/30/2011   Procedure: CARPAL TUNNEL RELEASE;  Surgeon: Wynonia Sours, MD;  Location: [facility];  Service: Orthopedics;  Laterality: Right;  . CHOLECYSTECTOMY  10/11   lap choli  . GALLBLADDER SURGERY  04/04/2010  . HYSTEROSCOPY N/A 02/08/2014   Procedure: HYSTEROSCOPY;  Surgeon: Woodroe Mode, MD;  Location: [facility];  Service: Gynecology;  Laterality: N/A;  . HYSTEROSCOPY WITH NOVASURE N/A 02/08/2014   Procedure: ATTEMPTED NOVASURE ABLATION;  Surgeon: Woodroe Mode, MD;  Location: [facility];  Service: Gynecology;  Laterality: N/A;  . INTRAUTERINE DEVICE INSERTION     07/19/2013  . SEPTOPLASTY N/A 09/08/2013  Procedure: SEPTOPLASTY;  Surgeon: Ruby Cola, MD;  Location: [facility];  Service: ENT;  Laterality: N/A;  . TONSILLECTOMY AND ADENOIDECTOMY Bilateral 09/08/2013   Procedure: TONSILLECTOMY ;  Surgeon: Ruby Cola, MD;  Location: [facility];  Service: ENT;  Laterality: Bilateral;  . TUBAL LIGATION    . VAGINAL HYSTERECTOMY N/A 04/12/2014   Procedure: LAPAROSCOPIC ASSISTED VAGINAL HYSTERECTOMY;  Surgeon: Woodroe Mode, MD;  Location: [facility];  Service:  Gynecology;  Laterality: N/A;  . WISDOM TOOTH EXTRACTION       OB History    Gravida  3   Para  3   Term  3   Preterm  0   AB  0   Living  3     SAB  0   TAB  0   Ectopic  0   Multiple  0   Live Births               Home Medications    Prior to Admission medications   Medication Sig Start Date End Date Taking? Authorizing Provider  ACCU-CHEK FASTCLIX LANCETS MISC USE TO CHECK BLOOD SUGAR TWICE DAILY 03/23/18   [provider]  ACCU-CHEK GUIDE test strip USE 1 [place] 03/30/18   [provider]  amoxicillin-clavulanate (AUGMENTIN) 875-125 MG tablet Take 1 tablet by mouth every 12 (twelve) hours for 5 days. 10/12/18 10/17/18  Albrizze, Kaitlyn E, PA-C  cyclobenzaprine (FLEXERIL) 10 MG tablet Take 1 tablet (10 mg total) by mouth at bedtime. 08/17/18   Lanae Boast, FNP  fluticasone (FLOVENT HFA) 110 MCG/ACT inhaler Inhale 1 puff into the lungs 2 (two) times daily. 09/03/18   Lanae Boast, FNP  furosemide (LASIX) 40 MG tablet Take 1 tablet (40 mg total) by mouth daily. 09/11/18   Lanae Boast, FNP  gabapentin (NEURONTIN) 600 MG tablet Take 600 mg by mouth 2 (two) times daily.  03/26/18   [provider]  ibuprofen (ADVIL,MOTRIN) 800 MG tablet Take 800 mg by mouth every 8 (eight) hours as needed for moderate pain.    [provider]  lidocaine (LIDODERM) 5 % Place 1 patch onto the skin daily as needed (pain).  03/22/18   [provider]  loratadine (CLARITIN) 10 MG tablet Take 1 tablet (10 mg total) by mouth daily. 09/11/18   Lanae Boast, FNP  metFORMIN (GLUCOPHAGE-XR) 750 MG 24 hr tablet Take 750 mg by mouth daily after supper.    [provider]  metoprolol tartrate (LOPRESSOR) 25 MG tablet Take 25 mg by mouth 2 (two) times daily.     [provider]  montelukast (SINGULAIR) 10 MG tablet Take 1 tablet (10 mg total) by mouth at bedtime. 09/11/18   Lanae Boast, FNP  morphine (MS CONTIN) 15 MG 12 hr  tablet Take 15 mg by mouth every 12 (twelve) hours. 07/01/18   [provider]  Oxycodone HCl 10 MG TABS Take 10 mg by mouth 5 (five) times daily.     [provider]  potassium chloride (K-DUR) 10 MEQ tablet Take 1 tablet (10 mEq total) by mouth daily. 09/11/18   Lanae Boast, FNP  predniSONE (DELTASONE) 20 MG tablet Take 2 tablets (40 mg total) by mouth daily for 4 days. 10/12/18 10/16/18  Albrizze, Kaitlyn E, PA-C  verapamil (CALAN-SR) 180 MG CR tablet Take 180 mg by mouth at bedtime.    [provider]    Family History Family History  Problem Relation Age of Onset  . Diabetes Mother   .  Hypertension Father   . Heart disease Other   . Diabetes Other   . Hypertension Other   . Alcohol abuse Other     Social History Social History   Tobacco Use  . Smoking status: Current Some Day Smoker    Packs/day: 0.25    Types: Cigarettes    Last attempt to quit: 02/11/2016    Years since quitting: 2.6  . Smokeless tobacco: Never Used  Substance Use Topics  . Alcohol use: No  . Drug use: No     Allergies   Patient has no known allergies.   Review of Systems Review of Systems  Constitutional: Negative for chills and fever.  HENT: Positive for congestion, ear pain and sinus pressure. Negative for ear discharge, sinus pain and sore throat.   Eyes: Negative for pain and redness.  Respiratory: Positive for cough. Negative for shortness of breath.   Cardiovascular: Negative for chest pain and leg swelling.  Gastrointestinal: Negative for abdominal pain, constipation, diarrhea, nausea and vomiting.  Genitourinary: Negative for dysuria and hematuria.  Musculoskeletal: Negative for back pain and neck pain.  Skin: Negative for wound.  Neurological: Positive for headaches. Negative for weakness and numbness.     Physical Exam Updated Vital Signs BP (!) 173/111 (BP Location: Left Arm)   Pulse 98   Temp 98.7 F (37.1 C) (Oral)   Resp (!) 22   Ht 5\' 5"  (1.651  m)   Wt (!) 179.2 kg   LMP 02/13/2014   SpO2 92%   BMI 65.73 kg/m   Physical Exam Vitals signs and nursing note reviewed.  Constitutional:      Appearance: She is obese. She is not toxic-appearing or diaphoretic.  HENT:     Head: Normocephalic and atraumatic.     Comments: No temporal tenderness. Maxillary and frontal sinus tenderness.     Right Ear: No middle ear effusion. No mastoid tenderness. Tympanic membrane is not bulging.     Left Ear: A middle ear effusion is present. No mastoid tenderness. Tympanic membrane is bulging.     Nose: Congestion present.     Mouth/Throat:     Mouth: Mucous membranes are dry.     Pharynx: No oropharyngeal exudate or posterior oropharyngeal erythema.  Eyes:     General: No scleral icterus.    Extraocular Movements: Extraocular movements intact.     Conjunctiva/sclera: Conjunctivae normal.     Pupils: Pupils are equal, round, and reactive to light.  Neck:     Musculoskeletal: Normal range of motion.  Cardiovascular:     Rate and Rhythm: Normal rate and regular rhythm.     Pulses: Normal pulses.          Radial pulses are 2+ on the right side and 2+ on the left side.     Heart sounds: Normal heart sounds.  Pulmonary:     Effort: Pulmonary effort is normal.     Comments: Lung sounds diminished throughout. Pt is speaking in full sentences, she is in no acute distress. No accessory muscle use, no pursed lip breathing. Abdominal:     General: There is no distension.     Palpations: Abdomen is soft.     Tenderness: There is no abdominal tenderness.  Musculoskeletal: Normal range of motion.  Skin:    General: Skin is warm and dry.  Neurological:     Mental Status: She is alert and oriented to person, place, and time.     Comments: Speech is clear and goal  oriented, follows commands CN III-XII intact, no facial droop Normal strength in upper and lower extremities bilaterally including dorsiflexion and plantar flexion, strong and equal grip  strength Sensation normal to light and sharp touch Moves extremities without ataxia, coordination intact Normal finger to nose and rapid alternating movements Normal gait and balance   Psychiatric:        Behavior: Behavior normal.      ED Treatments / Results  Labs (all labs ordered are listed, but only abnormal results are displayed) Labs Reviewed - No data to display  EKG None  Radiology Dg Chest Portable 1 View  Result Date: 10/12/2018 CLINICAL DATA:  Asthma exacerbation.  Short of breath EXAM: PORTABLE CHEST 1 VIEW COMPARISON:  03/13/2016 FINDINGS: Prominent heart size without heart failure or edema. No definite infiltrate however image quality is suboptimal due to portable technique and large patient size. No effusion. IMPRESSION: No active disease. Electronically Signed   By: Franchot Gallo M.D.   On: 10/12/2018 19:51    Procedures Procedures (including critical care time)  Medications Ordered in ED Medications  fluticasone (FLOVENT HFA) 44 MCG/ACT inhaler 2 puff (2 puffs Inhalation Given 10/12/18 1933)  predniSONE (DELTASONE) tablet 60 mg (60 mg Oral Given 10/12/18 1931)  acetaminophen (TYLENOL) tablet 650 mg (650 mg Oral Given 10/12/18 1931)     Initial Impression / Assessment and Plan / ED Course  I have reviewed the triage vital signs and the nursing notes.  Pertinent labs & imaging results that were available during my care of the patient were reviewed by me and considered in my medical decision making (see chart for details).  Pt is afebrile, she is non toxic appearing. She called her pcp prior to coming in today who recommended she come to the ED for further evaluation.  She has headache, productive cough, and congestion. On exam she has sinus tenderness, diminished lung sounds throughout. Will get chest xray, give inhaler, tylenol and predisnose.    Chest xray viewed by me is negative for acute infectious processes.She reports it is easier to breath after using  the inhaler.  I ambulated pt in her exam room and SpO2 is 95-97% on room air. She has fluid behind left TM and bulging TM, suggestive of otitis media. On reassessment her headache improved with Tylenol.  Presentation is like pts typical HA and non concerning for [facility], ICH, Meningitis, or temporal arteritis. Pt is afebrile with no focal neuro deficits, nuchal rigidity, or change in vision.   Will discharge home with prescription for prednisone for asthma exacerbation. She is type 2 diabetic and sugars have been well controlled. Recommend she continue to monitor blood sugar closely and discontinue if hyperglycemic. Will also discharge home with prescription for Augmentin to cover for otitis media and sinus infection. Also recommend increase PO fluids and symptomatic treatment. Pt takes her blood pressure medication at night and has not yet taken it today. I offered home dose of her lasix and she declined. She plans to take it immediately when she gets home.  Patient is hemodynamically stable, in NAD, and able to ambulate in the ED. Evaluation does not show pathology that would require ongoing emergent intervention or inpatient treatment. I explained the diagnosis to the patient. Pain has been managed and has no complaints prior to discharge. Patient is comfortable with above plan and is stable for discharge at this time. All questions were answered prior to disposition. Strict return precautions for returning to the ED were discussed.  Recommended pt  follow up with pcp in 2-5 days to have blood pressure rechecked.    This note was prepared with assistance of Systems analyst. Occasional wrong-word or sound-a-like substitutions may have occurred due to the inherent limitations of voice recognition software.   Final Clinical Impressions(s) / ED Diagnoses   Final diagnoses:  Acute otitis media, unspecified otitis media type  Acute nonintractable headache, unspecified headache type   Exacerbation of asthma, unspecified asthma severity, unspecified whether persistent  Sinus congestion    ED Discharge Orders         Ordered    predniSONE (DELTASONE) 20 MG tablet  Daily     10/12/18 2030    amoxicillin-clavulanate (AUGMENTIN) 875-125 MG tablet  Every 12 hours     10/12/18 2030           Flint Melter 10/12/18 2355    Lacretia Leigh, MD 10/14/18 [id number]

## 2018-10-15 ENCOUNTER — Other Ambulatory Visit: Payer: Self-pay

## 2018-10-15 ENCOUNTER — Encounter: Payer: Self-pay | Admitting: Cardiology

## 2018-10-15 ENCOUNTER — Ambulatory Visit: Payer: Medicaid Other | Admitting: Cardiology

## 2018-10-15 VITALS — BP 174/110 | Ht 65.0 in | Wt >= 6400 oz

## 2018-10-15 DIAGNOSIS — I10 Essential (primary) hypertension: Secondary | ICD-10-CM

## 2018-10-15 MED ORDER — ISOSORB DINITRATE-HYDRALAZINE 20-37.5 MG PO TABS: 1.0000 | ORAL_TABLET | Freq: Two times a day (BID) | ORAL | 2 refills | Status: DC

## 2018-10-15 MED ORDER — CARVEDILOL 12.5 MG PO TABS: 12.5000 mg | ORAL_TABLET | Freq: Two times a day (BID) | ORAL | 2 refills | Status: DC

## 2018-10-15 NOTE — Progress Notes (Signed)
Virtual Visit via Video Note   Subjective:   Cynthia Becker, female    DOB: [birth year], 48 y.o.   MRN: [id number]   I connected with the patient on 10/15/18 by a video enabled telemedicine application and verified that I am speaking with the correct person using two identifiers.     I discussed the limitations of evaluation and management by telemedicine and the availability of in person appointments. The patient expressed understanding and agreed to proceed.   This visit type was conducted due to national recommendations for restrictions regarding the COVID-19 Pandemic (e.g. social distancing).  This format is felt to be most appropriate for this patient at this time.  All issues noted in this document were discussed and addressed.  No physical exam was performed (except for noted visual exam findings with Tele health visits).  The patient has consented to conduct a Tele health visit and understands insurance will be billed.     Chief complaint:  Hypertension   HPI  48 year old African-American female with hypertension, hyperlipidemia, type 2 DM, morbid obesity, hyperlipidemia  Patient has had difficult time controlling her weight and blood pressure since Christmas 2019. However, she now has insight into importance of weight loss, blood pressure control. She is going to initiate an e-visit with [facility] regarding weight loss treatments. She as in the ED last week with shortness of breath, and was given prednisone and amoxicillin for possible COPD exacerbation. She is finishing her prednisone therapy tomorrow. However, her blood pressure has been elevated even before that. She sis not tolerate lisinopril due to lightheadedness and was started on verapamil by PCP, but has not seemed to lower her blood pressure.    Past Medical History:  Diagnosis Date  . Anemia    iv iron treatment dec 2014/ see oncology for this last in dec 2014  . Anxiety   . Arthritis    knee,  hands  . Asthma    seasonal related/ albuterol used when uri  . Depression   . H/O dizziness    Hx  . Headache(784.0)    otc med prn  . High blood pressure   . SVD (spontaneous vaginal delivery)    x 3     Past Surgical History:  Procedure Laterality Date  . ABDOMINAL HYSTERECTOMY  03/13/2014   total   . BILATERAL SALPINGECTOMY Bilateral 04/12/2014   Procedure: BILATERAL SALPINGECTOMY;  Surgeon: Woodroe Mode, MD;  Location: [facility];  Service: Gynecology;  Laterality: Bilateral;  . CARPAL TUNNEL RELEASE  08/30/2011   Procedure: CARPAL TUNNEL RELEASE;  Surgeon: Wynonia Sours, MD;  Location: [facility];  Service: Orthopedics;  Laterality: Right;  . CHOLECYSTECTOMY  10/11   lap choli  . GALLBLADDER SURGERY  04/04/2010  . HYSTEROSCOPY N/A 02/08/2014   Procedure: HYSTEROSCOPY;  Surgeon: Woodroe Mode, MD;  Location: [facility];  Service: Gynecology;  Laterality: N/A;  . HYSTEROSCOPY WITH NOVASURE N/A 02/08/2014   Procedure: ATTEMPTED NOVASURE ABLATION;  Surgeon: Woodroe Mode, MD;  Location: [facility];  Service: Gynecology;  Laterality: N/A;  . INTRAUTERINE DEVICE INSERTION     07/19/2013  . SEPTOPLASTY N/A 09/08/2013   Procedure: SEPTOPLASTY;  Surgeon: Ruby Cola, MD;  Location: [facility];  Service: ENT;  Laterality: N/A;  . TONSILLECTOMY AND ADENOIDECTOMY Bilateral 09/08/2013   Procedure: TONSILLECTOMY ;  Surgeon: Ruby Cola, MD;  Location: [facility];  Service: ENT;  Laterality: Bilateral;  . TUBAL LIGATION    . VAGINAL HYSTERECTOMY  N/A 04/12/2014   Procedure: LAPAROSCOPIC ASSISTED VAGINAL HYSTERECTOMY;  Surgeon: Woodroe Mode, MD;  Location: [facility];  Service: Gynecology;  Laterality: N/A;  . WISDOM TOOTH EXTRACTION       Social History   Socioeconomic History  . Marital status: Married    Spouse name: Not on file  . Number of children: Not on file  . Years of education: Not on file  . Highest education level: Not on file  Occupational History  . Not on file  Social  Needs  . Financial resource strain: Not on file  . Food insecurity:    Worry: Not on file    Inability: Not on file  . Transportation needs:    Medical: Not on file    Non-medical: Not on file  Tobacco Use  . Smoking status: Current Some Day Smoker    Packs/day: 0.25    Types: Cigarettes    Last attempt to quit: 02/11/2016    Years since quitting: 2.6  . Smokeless tobacco: Never Used  Substance and Sexual Activity  . Alcohol use: No  . Drug use: No  . Sexual activity: Yes    Birth control/protection: Surgical  Lifestyle  . Physical activity:    Days per week: Not on file    Minutes per session: Not on file  . Stress: Not on file  Relationships  . Social connections:    Talks on phone: Not on file    Gets together: Not on file    Attends religious service: Not on file    Active member of club or organization: Not on file    Attends meetings of clubs or organizations: Not on file    Relationship status: Not on file  . Intimate partner violence:    Fear of current or ex partner: Not on file    Emotionally abused: Not on file    Physically abused: Not on file    Forced sexual activity: Not on file  Other Topics Concern  . Not on file  Social History Narrative  . Not on file     Family History  Problem Relation Age of Onset  . Diabetes Mother   . Hypertension Father   . Heart disease Other   . Diabetes Other   . Hypertension Other   . Alcohol abuse Other      Current Outpatient Medications on File Prior to Visit  Medication Sig Dispense Refill  . ACCU-CHEK FASTCLIX LANCETS MISC USE TO CHECK BLOOD SUGAR TWICE DAILY  11  . ACCU-CHEK GUIDE test strip USE 1 STRIP TWICE DAILY  11  . amoxicillin-clavulanate (AUGMENTIN) 875-125 MG tablet Take 1 tablet by mouth every 12 (twelve) hours for 5 days. 10 tablet 0  . cyclobenzaprine (FLEXERIL) 10 MG tablet Take 1 tablet (10 mg total) by mouth at bedtime. 30 tablet 0  . fluticasone (FLOVENT HFA) 110 MCG/ACT inhaler Inhale 1  puff into the lungs 2 (two) times daily. 1 Inhaler 12  . furosemide (LASIX) 40 MG tablet Take 1 tablet (40 mg total) by mouth daily. 30 tablet 3  . gabapentin (NEURONTIN) 600 MG tablet Take 600 mg by mouth 2 (two) times daily.   3  . ibuprofen (ADVIL,MOTRIN) 800 MG tablet Take 800 mg by mouth every 8 (eight) hours as needed for moderate pain.    Marland Kitchen lidocaine (LIDODERM) 5 % Place 1 patch onto the skin daily as needed (pain).   5  . loratadine (CLARITIN) 10 MG tablet Take 1 tablet (  10 mg total) by mouth daily. 30 tablet 11  . metFORMIN (GLUCOPHAGE-XR) 750 MG 24 hr tablet Take 750 mg by mouth daily after supper.    . metoprolol tartrate (LOPRESSOR) 25 MG tablet Take 25 mg by mouth 2 (two) times daily.     . montelukast (SINGULAIR) 10 MG tablet Take 1 tablet (10 mg total) by mouth at bedtime. 30 tablet 3  . morphine (MS CONTIN) 15 MG 12 hr tablet Take 15 mg by mouth every 12 (twelve) hours.    . Oxycodone HCl 10 MG TABS Take 10 mg by mouth 5 (five) times daily.     . potassium chloride (K-DUR) 10 MEQ tablet Take 1 tablet (10 mEq total) by mouth daily. 30 tablet 2  . predniSONE (DELTASONE) 20 MG tablet Take 2 tablets (40 mg total) by mouth daily for 4 days. 8 tablet 0  . verapamil (CALAN-SR) 180 MG CR tablet Take 180 mg by mouth at bedtime.     No current facility-administered medications on file prior to visit.     Cardiovascular studies:  EKG 04/16/2018: Sinus rhythm 100 bpm. First degree AV block. Left atrial enlargement. Poor R wave progression.  Echocardiogram 04/21/2017: Left ventricle cavity is normal in size. Mild concentric remodeling of the left ventricle. Mild decrease in global wall motion. Visual EF is 45-50%. Doppler evidence of grade I (impaired) diastolic dysfunction, normal LAP.  Left atrial cavity is mildly dilated.  Recent labs: None recently  Review of Systems  Constitution: Negative for decreased appetite, malaise/fatigue, weight gain and weight loss.  HENT: Negative  for congestion.   Eyes: Negative for visual disturbance.  Cardiovascular: Positive for dyspnea on exertion and leg swelling. Negative for chest pain, palpitations and syncope.  Respiratory: Positive for shortness of breath.   Endocrine: Negative for cold intolerance.  Hematologic/Lymphatic: Does not bruise/bleed easily.  Skin: Negative for itching and rash.  Musculoskeletal: Negative for myalgias.  Gastrointestinal: Negative for abdominal pain, nausea and vomiting.  Genitourinary: Negative for dysuria.  Neurological: Negative for dizziness and weakness.  Psychiatric/Behavioral: The patient is not nervous/anxious.   All other systems reviewed and are negative.        Vitals:   10/11/18 1529  BP: (!) 174/110  Weight 445 lb (Measured by the patient using a home BP monitor)   Observation/findings during video visit   Objective:    Physical Exam  Constitutional: She is oriented to person, place, and time. She appears well-developed. No distress.  Morbidly obese  Pulmonary/Chest: Effort normal.  Neurological: She is alert and oriented to person, place, and time.  Psychiatric: She has a normal mood and affect.  Nursing note and vitals reviewed.         Assessment & Recommendations:   48 year old African-American female with hypertension, hyperlipidemia, type 2 DM, morbid obesity, hyperlipidemia   Hypertension: Uncontrolled stage 2 hypertension. Stop metoprolol and verapamil. Start Bidil 20-37. Mg 1 tab bid and increase to TID as tolerated Start Coreg 12.5 mg bid. Continue lasix 40 mg daily.  Morbid obesity: Strongly recommend considering surgical weight loss, in addition to diet and lifestyle changes. She is motivated and wants to lose weight.  Hyperlipidmia: Not on statin. Needs repeat lipid panel at some point  Type 2 DM: Follow up with PCP.  I will see her back in 4 weeks.      Nigel Mormon, MD [facility] Pager: [phone]  Office: [phone] If no answer Cell [phone]

## 2018-11-03 ENCOUNTER — Ambulatory Visit: Payer: Medicaid Other | Admitting: Podiatry

## 2018-11-13 ENCOUNTER — Encounter (HOSPITAL_COMMUNITY): Payer: Self-pay | Admitting: *Deleted

## 2018-11-13 ENCOUNTER — Emergency Department (HOSPITAL_COMMUNITY)
Admission: EM | Admit: 2018-11-13 | Discharge: 2018-11-13 | Discharge status: Against Medical Advice | Payer: Medicaid Other | Attending: Emergency Medicine | Admitting: Emergency Medicine

## 2018-11-13 ENCOUNTER — Ambulatory Visit (HOSPITAL_COMMUNITY)
Admission: EM | Admit: 2018-11-13 | Discharge: 2018-11-13 | Disposition: A | Discharge status: Home / Self Care | Payer: Medicaid Other | Attending: Internal Medicine | Admitting: Internal Medicine

## 2018-11-13 ENCOUNTER — Other Ambulatory Visit: Payer: Self-pay

## 2018-11-13 DIAGNOSIS — Z5321 Procedure and treatment not carried out due to patient leaving prior to being seen by health care provider: Secondary | ICD-10-CM | POA: Diagnosis not present

## 2018-11-13 DIAGNOSIS — I10 Essential (primary) hypertension: Secondary | ICD-10-CM

## 2018-11-13 DIAGNOSIS — J301 Allergic rhinitis due to pollen: Secondary | ICD-10-CM | POA: Diagnosis not present

## 2018-11-13 DIAGNOSIS — R0981 Nasal congestion: Secondary | ICD-10-CM | POA: Diagnosis present

## 2018-11-13 MED ORDER — CLONIDINE HCL 0.1 MG PO TABS
0.1000 mg | ORAL_TABLET | Freq: Once | ORAL | Status: AC
  Administered 2018-11-13: 0.1 mg via ORAL

## 2018-11-13 MED ORDER — GUAIFENESIN ER 600 MG PO TB12: 600.0000 mg | ORAL_TABLET | Freq: Two times a day (BID) | ORAL | 0 refills | Status: AC

## 2018-11-13 MED ORDER — MECLIZINE HCL 25 MG PO TABS: 25.0000 mg | ORAL_TABLET | Freq: Three times a day (TID) | ORAL | 0 refills | Status: DC | PRN

## 2018-11-13 MED ORDER — FLUTICASONE PROPIONATE 50 MCG/ACT NA SUSP: 1.0000 | Freq: Every day | NASAL | 2 refills | Status: DC

## 2018-11-13 MED ORDER — CLONIDINE HCL 0.1 MG PO TABS
ORAL_TABLET | ORAL | Status: AC
  Filled 2018-11-13: qty 1

## 2018-11-13 NOTE — ED Notes (Signed)
ED Nurse [facility] not available.  ED Charge RN notified of pt.

## 2018-11-13 NOTE — ED Notes (Signed)
Upon VS recheck, pt's SaO2 maintaining around 90% RA.  Pt denies any SOB.  Dr Lanny Cramp notified; nstructed pt to go to ED; may go via private vehicle with spouse.  Pt & spouse verbalized understanding.

## 2018-11-13 NOTE — ED Provider Notes (Addendum)
[place]    CSN: [id number] Arrival date & time: 11/13/18  1703     History   Chief Complaint Chief Complaint  Patient presents with  . Nasal Congestion  . Ear Fullness  . Cough    HPI Cynthia Becker is a 48 y.o. female history of morbid obesity, asthma comes to urgent care with complaints of increasing nasal congestion, pressure over her sinuses and ringing in her ears..  Patient complains of diminished hearing.  No dizziness but she complains of unsteady gait.  Symptoms have been ongoing for the past 2 to 3 weeks.  She was evaluated a few weeks ago and treated with a short course of steroids and antibiotics.  She has a cough which is nonproductive.  She admits to having some nasal itching and postnasal dripping.  No shortness of breath.  She has intermittent wheezing.  No nausea or vomiting.  She complains of ringing in the ears.  She has allergies and is currently on Singulair.  Complains of loss of appetite and loss of taste. HPI  Past Medical History:  Diagnosis Date  . Anemia    iv iron treatment dec 2014/ see oncology for this last in dec 2014  . Anxiety   . Arthritis    knee, hands  . Asthma    seasonal related/ albuterol used when uri  . Depression   . H/O dizziness    Hx  . Headache(784.0)    otc med prn  . High blood pressure   . SVD (spontaneous vaginal delivery)    x 3    Patient Active Problem List   Diagnosis Date Noted  . Morbid obesity with BMI of 60.0-69.9, adult ([place]) 09/14/2014  . Iron deficiency anemia due to chronic blood loss 09/14/2014  . CN (constipation) 09/14/2014  . Postoperative state 04/12/2014  . Uterine fibroid 03/17/2014  . DUB (dysfunctional uterine bleeding) 01/27/2014  . Iron deficiency anemia due to chronic blood loss 04/09/2013  . 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 Menometrorrhagia 04/09/2013  . Left hand pain 09/18/2011  . High blood pressure   . ANEMIA, IRON DEFICIENCY 08/06/2010  . CHOLECYSTECTOMY, HX OF  04/09/2010  . HYPERLIPIDEMIA 02/13/2010  . DEGENERATIVE DISC DISEASE, LUMBOSACRAL SPINE W/RADICULOPATHY 11/01/2009  . BACK PAIN, LUMBAR, WITH RADICULOPATHY 10/30/2009  . TRIGGER FINGER 02/03/2009  . CARPAL TUNNEL SYNDROME 12/21/2008  . HEADACHE 11/09/2008  . DEPRESSION 10/04/2008  . URI 08/24/2008  . INSOMNIA 08/24/2008  . ASTHMA 06/06/2008  . Morbid obesity ([place]) 05/24/2008  . ESSENTIAL HYPERTENSION 05/24/2008    Past Surgical History:  Procedure Laterality Date  . ABDOMINAL HYSTERECTOMY  03/13/2014   total   . BILATERAL SALPINGECTOMY Bilateral 04/12/2014   Procedure: BILATERAL SALPINGECTOMY;  Surgeon: Woodroe Mode, MD;  Location: [facility];  Service: Gynecology;  Laterality: Bilateral;  . CARPAL TUNNEL RELEASE  08/30/2011   Procedure: CARPAL TUNNEL RELEASE;  Surgeon: Wynonia Sours, MD;  Location: [facility];  Service: Orthopedics;  Laterality: Right;  . CHOLECYSTECTOMY  10/11   lap choli  . GALLBLADDER SURGERY  04/04/2010  . HYSTEROSCOPY N/A 02/08/2014   Procedure: HYSTEROSCOPY;  Surgeon: Woodroe Mode, MD;  Location: [facility];  Service: Gynecology;  Laterality: N/A;  . HYSTEROSCOPY WITH NOVASURE N/A 02/08/2014   Procedure: ATTEMPTED NOVASURE ABLATION;  Surgeon: Woodroe Mode, MD;  Location: [facility];  Service: Gynecology;  Laterality: N/A;  . INTRAUTERINE DEVICE INSERTION     07/19/2013  . SEPTOPLASTY N/A 09/08/2013  Procedure: SEPTOPLASTY;  Surgeon: Ruby Cola, MD;  Location: [facility];  Service: ENT;  Laterality: N/A;  . TONSILLECTOMY AND ADENOIDECTOMY Bilateral 09/08/2013   Procedure: TONSILLECTOMY ;  Surgeon: Ruby Cola, MD;  Location: [facility];  Service: ENT;  Laterality: Bilateral;  . TUBAL LIGATION    . VAGINAL HYSTERECTOMY N/A 04/12/2014   Procedure: LAPAROSCOPIC ASSISTED VAGINAL HYSTERECTOMY;  Surgeon: Woodroe Mode, MD;  Location: [facility];  Service: Gynecology;  Laterality: N/A;  . WISDOM TOOTH EXTRACTION      OB History    Gravida  3   Para  3   Term   3   Preterm  0   AB  0   Living  3     SAB  0   TAB  0   Ectopic  0   Multiple  0   Live Births               Home Medications    Prior to Admission medications   Medication Sig Start Date End Date Taking? Authorizing Provider  amitriptyline (ELAVIL) 50 MG tablet Take 1 tablet by mouth as needed. 10/07/18  Yes [provider]  carvedilol (COREG) 12.5 MG tablet Take 1 tablet (12.5 mg total) by mouth 2 (two) times daily. 10/15/18 01/13/19 Yes Patwardhan, Manish J, MD  cyclobenzaprine (FLEXERIL) 10 MG tablet Take 1 tablet (10 mg total) by mouth at bedtime. 08/17/18  Yes Lanae Boast, FNP  fluticasone (FLOVENT HFA) 110 MCG/ACT inhaler Inhale 1 puff into the lungs 2 (two) times daily. 09/03/18  Yes Lanae Boast, FNP  furosemide (LASIX) 40 MG tablet Take 1 tablet (40 mg total) by mouth daily. 09/11/18  Yes Lanae Boast, FNP  gabapentin (NEURONTIN) 600 MG tablet Take 600 mg by mouth as needed.  03/26/18  Yes [provider]  ibuprofen (ADVIL,MOTRIN) 800 MG tablet Take 800 mg by mouth every 8 (eight) hours as needed for moderate pain.   Yes [provider]  isosorbide-hydrALAZINE (BIDIL) 20-37.5 MG tablet Take 1 tablet by mouth 2 (two) times a day. 10/15/18  Yes Patwardhan, Manish J, MD  lamoTRIgine (LAMICTAL PO) Take by mouth.   Yes [provider]  metFORMIN (GLUCOPHAGE-XR) 750 MG 24 hr tablet Take 750 mg by mouth as needed.    Yes [provider]  montelukast (SINGULAIR) 10 MG tablet Take 1 tablet (10 mg total) by mouth at bedtime. 09/11/18  Yes Lanae Boast, FNP  morphine (MS CONTIN) 15 MG 12 hr tablet Take 15 mg by mouth every 12 (twelve) hours. 07/01/18  Yes [provider]  Oxycodone HCl 10 MG TABS Take 10 mg by mouth 5 (five) times daily.    Yes [provider]  potassium chloride (K-DUR) 10 MEQ tablet Take 1 tablet (10 mEq total) by mouth daily. 09/11/18  Yes Lanae Boast, FNP  QUEtiapine Fumarate (SEROQUEL PO)  Take by mouth.   Yes [provider]  ACCU-CHEK FASTCLIX LANCETS MISC USE TO CHECK BLOOD SUGAR TWICE DAILY 03/23/18   [provider]  ACCU-CHEK GUIDE test strip USE 1 [place] 03/30/18   [provider]  fluticasone (FLONASE) 50 MCG/ACT nasal spray Place 1 spray into both nostrils daily. 11/13/18   Chase Picket, MD  guaiFENesin (MUCINEX) 600 MG 12 hr tablet Take 1 tablet (600 mg total) by mouth 2 (two) times daily for 10 days. 11/13/18 11/23/18  LampteyMyrene Galas, MD  lidocaine (LIDODERM) 5 % Place 1 patch onto the skin daily as  needed (pain).  03/22/18   [provider]  loratadine (CLARITIN) 10 MG tablet Take 1 tablet (10 mg total) by mouth daily. 09/11/18   Lanae Boast, FNP  meclizine (ANTIVERT) 25 MG tablet Take 1 tablet (25 mg total) by mouth 3 (three) times daily as needed for dizziness. 11/13/18   LampteyMyrene Galas, MD    Family History Family History  Problem Relation Age of Onset  . Diabetes Mother   . Hypertension Father   . Heart disease Other   . Diabetes Other   . Hypertension Other   . Alcohol abuse Other     Social History Social History   Tobacco Use  . Smoking status: Current Some Day Smoker    Packs/day: 0.25    Types: Cigarettes    Last attempt to quit: 02/11/2016    Years since quitting: 2.7  . Smokeless tobacco: Never Used  Substance Use Topics  . Alcohol use: No  . Drug use: No     Allergies   Patient has no known allergies.   Review of Systems Review of Systems  Constitutional: Positive for activity change, appetite change and fatigue. Negative for fever.  HENT: Positive for congestion, hearing loss, postnasal drip, rhinorrhea, sinus pressure and tinnitus. Negative for ear discharge, ear pain, facial swelling, mouth sores, nosebleeds, sinus pain, sneezing, sore throat, trouble swallowing and voice change.   Respiratory: Positive for cough. Negative for chest tightness, shortness of breath, wheezing and  stridor.   Gastrointestinal: Negative for abdominal distention, abdominal pain, diarrhea and nausea.  Genitourinary: Negative for dysuria, frequency and urgency.  Musculoskeletal: Negative.   Skin: Negative.   Neurological: Negative for dizziness, seizures, syncope, speech difficulty, weakness, light-headedness and numbness.  All other systems reviewed and are negative.    Physical Exam Triage Vital Signs ED Triage Vitals  Enc Vitals Group     BP 11/13/18 1717 (!) 195/110     Pulse Rate 11/13/18 1717 93     Resp 11/13/18 1717 20     Temp 11/13/18 1717 98.6 F (37 C)     Temp Source 11/13/18 1717 Oral     SpO2 11/13/18 1717 96 %     Weight --      Height --      Head Circumference --      Peak Flow --      Pain Score 11/13/18 1719 0     Pain Loc --      Pain Edu? --      Excl. in [place]? --    No data found.  Updated Vital Signs BP (!) 195/110 Comment: States BPs have been running in this range; recent med change  Pulse 93   Temp 98.6 F (37 C) (Oral)   Resp 20   LMP 02/13/2014   SpO2 96%   Visual Acuity Right Eye Distance:   Left Eye Distance:   Bilateral Distance:    Right Eye Near:   Left Eye Near:    Bilateral Near:     Physical Exam Vitals signs and nursing note reviewed.  Constitutional:      General: She is not in acute distress.    Appearance: Normal appearance. She is obese. She is ill-appearing. She is not toxic-appearing.  HENT:     Right Ear: Tympanic membrane, ear canal and external ear normal. There is no impacted cerumen.     Left Ear: Tympanic membrane, ear canal and external ear normal. There is no impacted cerumen.  Nose: Congestion present.     Mouth/Throat:     Mouth: Mucous membranes are dry.     Pharynx: No oropharyngeal exudate or posterior oropharyngeal erythema.  Eyes:     Extraocular Movements: Extraocular movements intact.     Conjunctiva/sclera: Conjunctivae normal.  Neck:     Musculoskeletal: Normal range of motion and neck  supple.  Cardiovascular:     Rate and Rhythm: Normal rate and regular rhythm.     Pulses: Normal pulses.     Heart sounds: Normal heart sounds.  Pulmonary:     Effort: Pulmonary effort is normal.     Breath sounds: Normal breath sounds.  Abdominal:     General: Bowel sounds are normal. There is no distension.     Palpations: Abdomen is soft.     Tenderness: There is no abdominal tenderness.  Musculoskeletal: Normal range of motion.        General: No deformity or signs of injury.  Skin:    General: Skin is warm.     Capillary Refill: Capillary refill takes less than 2 seconds.     Coloration: Skin is not jaundiced.     Findings: No bruising.  Neurological:     General: No focal deficit present.     Mental Status: She is alert and oriented to person, place, and time.     Sensory: No sensory deficit.  Psychiatric:        Mood and Affect: Mood normal.        Behavior: Behavior normal.      UC Treatments / Results  Labs (all labs ordered are listed, but only abnormal results are displayed) Labs Reviewed - No data to display  EKG None  Radiology No results found.  Procedures Procedures (including critical care time)  Medications Ordered in UC Medications  cloNIDine (CATAPRES) tablet 0.1 mg (0.1 mg Oral Given 11/13/18 1755)    Initial Impression / Assessment and Plan / UC Course  I have reviewed the triage vital signs and the nursing notes.  Pertinent labs & imaging results that were available during my care of the patient were reviewed by me and considered in my medical decision making (see chart for details).     1.  Allergic rhinosinusitis: Fluticasone nasal spray Saline nasal spray as needed Continue Singulair  2.  Vertigo: Meclizine as needed  3.  Uncontrolled hypertension: Clonidine 0.1 mg given Blood pressure recheck. Compliance with medication was emphasized Patient admits to medication noncompliance.  4.  Morbid obesity/chronic tobacco  use/history of asthma: Patient was reevaluated in the urgent care, her blood pressure improved after a dose of clonidine.  Her pulse oximetry decreased from 96% to 88-91% on room air while the patient was sitting down.  With activity, anticipate that the patient's oximetry would drop even further.  Patient will need further evaluation in the emergency department.  Final Clinical Impressions(s) / UC Diagnoses   Final diagnoses:  Seasonal allergic rhinitis due to pollen  Essential hypertension   Discharge Instructions   None    ED Prescriptions    Medication Sig Dispense Auth. Provider   fluticasone (FLONASE) 50 MCG/ACT nasal spray Place 1 spray into both nostrils daily. 16 g Patric Buckhalter, Myrene Galas, MD   meclizine (ANTIVERT) 25 MG tablet Take 1 tablet (25 mg total) by mouth 3 (three) times daily as needed for dizziness. 30 tablet Brita Jurgensen, Myrene Galas, MD   guaiFENesin (MUCINEX) 600 MG 12 hr tablet Take 1 tablet (600 mg total) by mouth 2 (  two) times daily for 10 days. 20 tablet Demir Titsworth, Myrene Galas, MD     Controlled Substance Prescriptions [place] Controlled Substance Registry consulted? No   Chase Picket, MD 11/13/18 1814    Chase Picket, MD 11/13/18 3748    Chase Picket, MD 11/13/18 [phone]

## 2018-11-13 NOTE — ED Triage Notes (Addendum)
C/O nasal & head congestion with tinnitus and sensation of full bilat ears x approx 2-3 wks.  Denies any fevers.  C/O productive cough x "a while".

## 2018-11-13 NOTE — ED Triage Notes (Signed)
Pt here for nasal congestion. Seen at [facility] and they told her her O2 sats where low. 100% in triage.

## 2018-11-13 NOTE — ED Notes (Signed)
Per tech first pt is leaving

## 2018-11-19 ENCOUNTER — Telehealth: Payer: Self-pay

## 2018-11-19 NOTE — Telephone Encounter (Signed)
Called and spoke with patient for appointment tomorrow. She is experiencing coughing and headache. I advised her that per our protocol that we will need to schedule her for a TELE visit at this time. Patient verbalized understanding and was advised that NP will call tomorrow to do her appointment. Thanks!

## 2018-11-20 ENCOUNTER — Encounter: Payer: Self-pay | Admitting: Family Medicine

## 2018-11-20 ENCOUNTER — Ambulatory Visit (INDEPENDENT_AMBULATORY_CARE_PROVIDER_SITE_OTHER): Payer: Medicaid Other | Admitting: Family Medicine

## 2018-11-20 ENCOUNTER — Other Ambulatory Visit: Payer: Self-pay

## 2018-11-20 DIAGNOSIS — J0110 Acute frontal sinusitis, unspecified: Secondary | ICD-10-CM

## 2018-11-20 MED ORDER — DOXYCYCLINE HYCLATE 100 MG PO TABS: 100.0000 mg | ORAL_TABLET | Freq: Two times a day (BID) | ORAL | 0 refills | Status: DC

## 2018-11-20 MED ORDER — PREDNISONE 10 MG (21) PO TBPK: ORAL_TABLET | ORAL | 0 refills | Status: DC

## 2018-11-20 NOTE — Progress Notes (Signed)
  Patient [facility]  Virtual Visit via Telephone Note  I connected with Cynthia Becker on 11/20/18 at  3:20 PM EDT by telephone and verified that I am speaking with the correct person using two identifiers.   I discussed the limitations, risks, security and privacy concerns of performing an evaluation and management service by telephone and the availability of in person appointments. I also discussed with the patient that there may be a patient responsible charge related to this service. The patient expressed understanding and agreed to proceed.   History of Present Illness: Cynthia Becker  has a past medical history of Anemia, Anxiety, Arthritis, Asthma, Depression, H/O dizziness, Headache(784.0), High blood pressure, and SVD (spontaneous vaginal delivery).   Patient seen in the ED on 10/12/2018 and diagnosed with acute otitis media. Given augmentin and steroids. Patient states that she returned to urgent care on 11/13/2018 for continued sinus problems and given flonase. She states that she is not having relief. She would like to have a longer dose of antibiotics. Denies cough, fever or chills. Hx of asthma.   Observations/Objective: Patient with regular voice tone, rate and rhythm. Speaking calmly and is in no apparent distress.    Assessment and Plan: 1. Subacute frontal sinusitis Recommend sinus lavage. Continue with flonase as directed.  - doxycycline (VIBRA-TABS) 100 MG tablet; Take 1 tablet (100 mg total) by mouth 2 (two) times daily.  Dispense: 20 tablet; Refill: 0 - predniSONE (STERAPRED UNI-PAK 21 TAB) 10 MG (21) TBPK tablet; Take as directed on pack  Dispense: 21 tablet; Refill: 0     Follow Up Instructions:  We discussed hand washing, using hand sanitizer when soap and water are not available, only going out when absolutely necessary, and social distancing. Explained to patient that she is immunocompromised and will need to take  precautions during this time.   I discussed the assessment and treatment plan with the patient. The patient was provided an opportunity to ask questions and all were answered. The patient agreed with the plan and demonstrated an understanding of the instructions.   The patient was advised to call back or seek an in-person evaluation if the symptoms worsen or if the condition fails to improve as anticipated.  I provided 7 minutes of non-face-to-face time during this encounter.  Ms. Andr L. Nathaneil Canary, FNP-BC Patient [facility] [address] [place], [place] [address] [phone]

## 2018-11-25 ENCOUNTER — Encounter: Payer: Self-pay | Admitting: Cardiology

## 2018-11-25 ENCOUNTER — Ambulatory Visit (INDEPENDENT_AMBULATORY_CARE_PROVIDER_SITE_OTHER): Payer: Medicaid Other | Admitting: Cardiology

## 2018-11-25 ENCOUNTER — Other Ambulatory Visit: Payer: Self-pay

## 2018-11-25 DIAGNOSIS — E119 Type 2 diabetes mellitus without complications: Secondary | ICD-10-CM | POA: Diagnosis not present

## 2018-11-25 DIAGNOSIS — E785 Hyperlipidemia, unspecified: Secondary | ICD-10-CM

## 2018-11-25 DIAGNOSIS — I10 Essential (primary) hypertension: Secondary | ICD-10-CM

## 2018-11-25 MED ORDER — CARVEDILOL 25 MG PO TABS: 25.0000 mg | ORAL_TABLET | Freq: Two times a day (BID) | ORAL | 2 refills | Status: DC

## 2018-11-25 MED ORDER — ISOSORB DINITRATE-HYDRALAZINE 20-37.5 MG PO TABS: 1.0000 | ORAL_TABLET | Freq: Two times a day (BID) | ORAL | 2 refills | Status: DC

## 2018-11-25 NOTE — Progress Notes (Signed)
Virtual Visit via Video Note   Subjective:   Cynthia Becker, female    DOB: 07/03/1970, 48 y.o.   MRN: [id number]   I connected with the patient on 11/25/18 by a video enabled telemedicine application and verified that I am speaking with the correct person using two identifiers.     I discussed the limitations of evaluation and management by telemedicine and the availability of in person appointments. The patient expressed understanding and agreed to proceed.   This visit type was conducted due to national recommendations for restrictions regarding the COVID-19 Pandemic (e.g. social distancing).  This format is felt to be most appropriate for this patient at this time.  All issues noted in this document were discussed and addressed.  No physical exam was performed (except for noted visual exam findings with Tele health visits).  The patient has consented to conduct a Tele health visit and understands insurance will be billed.     Chief complaint:  Hypertension   HPI  48 year old African-American female with hypertension, hyperlipidemia, type 2 DM, morbid obesity, hyperlipidemia.  Patient continues to have trouble losing weight. BOP remains elevated. Diabetes is wwell controlled.   Past Medical History:  Diagnosis Date  . Anemia    iv iron treatment dec 2014/ see oncology for this last in dec 2014  . Anxiety   . Arthritis    knee, hands  . Asthma    seasonal related/ albuterol used when uri  . Depression   . H/O dizziness    Hx  . Headache(784.0)    otc med prn  . High blood pressure   . SVD (spontaneous vaginal delivery)    x 3     Past Surgical History:  Procedure Laterality Date  . ABDOMINAL HYSTERECTOMY  03/13/2014   total   . BILATERAL SALPINGECTOMY Bilateral 04/12/2014   Procedure: BILATERAL SALPINGECTOMY;  Surgeon: Woodroe Mode, MD;  Location: [facility];  Service: Gynecology;  Laterality: Bilateral;  . CARPAL TUNNEL RELEASE  08/30/2011   Procedure: CARPAL  TUNNEL RELEASE;  Surgeon: Wynonia Sours, MD;  Location: [place] [facility];  Service: Orthopedics;  Laterality: Right;  . CHOLECYSTECTOMY  10/11   lap choli  . GALLBLADDER SURGERY  04/04/2010  . HYSTEROSCOPY N/A 02/08/2014   Procedure: HYSTEROSCOPY;  Surgeon: Woodroe Mode, MD;  Location: [facility];  Service: Gynecology;  Laterality: N/A;  . HYSTEROSCOPY WITH NOVASURE N/A 02/08/2014   Procedure: ATTEMPTED NOVASURE ABLATION;  Surgeon: Woodroe Mode, MD;  Location: [facility];  Service: Gynecology;  Laterality: N/A;  . INTRAUTERINE DEVICE INSERTION     07/19/2013  . SEPTOPLASTY N/A 09/08/2013   Procedure: SEPTOPLASTY;  Surgeon: Ruby Cola, MD;  Location: [place] [facility];  Service: ENT;  Laterality: N/A;  . TONSILLECTOMY AND ADENOIDECTOMY Bilateral 09/08/2013   Procedure: TONSILLECTOMY ;  Surgeon: Ruby Cola, MD;  Location: [place];  Service: ENT;  Laterality: Bilateral;  . TUBAL LIGATION    . VAGINAL HYSTERECTOMY N/A 04/12/2014   Procedure: LAPAROSCOPIC ASSISTED VAGINAL HYSTERECTOMY;  Surgeon: Woodroe Mode, MD;  Location: [facility];  Service: Gynecology;  Laterality: N/A;  . WISDOM TOOTH EXTRACTION       Social History   Socioeconomic History  . Marital status: Married    Spouse name: Not on file  . Number of children: 3  . Years of education: Not on file  . Highest education level: Not on file  Occupational History  . Not on file  Social Needs  . Financial  resource strain: Not on file  . Food insecurity:    Worry: Not on file    Inability: Not on file  . Transportation needs:    Medical: Not on file    Non-medical: Not on file  Tobacco Use  . Smoking status: Current Some Day Smoker    Packs/day: 0.25    Types: Cigarettes    Last attempt to quit: 02/11/2016    Years since quitting: 2.7  . Smokeless tobacco: Never Used  Substance and Sexual Activity  . Alcohol use: No  . Drug use: No  . Sexual activity: Not on file  Lifestyle  . Physical activity:    Days per week: Not on file     Minutes per session: Not on file  . Stress: Not on file  Relationships  . Social connections:    Talks on phone: Not on file    Gets together: Not on file    Attends religious service: Not on file    Active member of club or organization: Not on file    Attends meetings of clubs or organizations: Not on file    Relationship status: Not on file  . Intimate partner violence:    Fear of current or ex partner: Not on file    Emotionally abused: Not on file    Physically abused: Not on file    Forced sexual activity: Not on file  Other Topics Concern  . Not on file  Social History Narrative  . Not on file     Family History  Problem Relation Age of Onset  . Diabetes Mother   . Hypertension Father   . Heart disease Other   . Diabetes Other   . Hypertension Other   . Alcohol abuse Other      Current Outpatient Medications on File Prior to Visit  Medication Sig Dispense Refill  . ACCU-CHEK FASTCLIX LANCETS MISC USE TO CHECK BLOOD SUGAR TWICE DAILY  11  . ACCU-CHEK GUIDE test strip USE 1 STRIP TWICE DAILY  11  . amitriptyline (ELAVIL) 50 MG tablet Take 1 tablet by mouth as needed.    . carvedilol (COREG) 12.5 MG tablet Take 1 tablet (12.5 mg total) by mouth 2 (two) times daily. 60 tablet 2  . cyclobenzaprine (FLEXERIL) 10 MG tablet Take 1 tablet (10 mg total) by mouth at bedtime. 30 tablet 0  . doxycycline (VIBRA-TABS) 100 MG tablet Take 1 tablet (100 mg total) by mouth 2 (two) times daily. 20 tablet 0  . fluticasone (FLONASE) 50 MCG/ACT nasal spray Place 1 spray into both nostrils daily. 16 g 2  . fluticasone (FLOVENT HFA) 110 MCG/ACT inhaler Inhale 1 puff into the lungs 2 (two) times daily. 1 Inhaler 12  . furosemide (LASIX) 40 MG tablet Take 1 tablet (40 mg total) by mouth daily. 30 tablet 3  . gabapentin (NEURONTIN) 600 MG tablet Take 600 mg by mouth as needed.   3  . ibuprofen (ADVIL,MOTRIN) 800 MG tablet Take 800 mg by mouth every 8 (eight) hours as needed for moderate  pain.    . isosorbide-hydrALAZINE (BIDIL) 20-37.5 MG tablet Take 1 tablet by mouth 2 (two) times a day. 60 tablet 2  . lamoTRIgine (LAMICTAL PO) Take by mouth.    . lidocaine (LIDODERM) 5 % Place 1 patch onto the skin daily as needed (pain).   5  . loratadine (CLARITIN) 10 MG tablet Take 1 tablet (10 mg total) by mouth daily. 30 tablet 11  . meclizine (ANTIVERT)  25 MG tablet Take 1 tablet (25 mg total) by mouth 3 (three) times daily as needed for dizziness. 30 tablet 0  . metFORMIN (GLUCOPHAGE-XR) 750 MG 24 hr tablet Take 750 mg by mouth as needed.     . montelukast (SINGULAIR) 10 MG tablet Take 1 tablet (10 mg total) by mouth at bedtime. 30 tablet 3  . morphine (MS CONTIN) 15 MG 12 hr tablet Take 15 mg by mouth every 12 (twelve) hours.    . Oxycodone HCl 10 MG TABS Take 10 mg by mouth 5 (five) times daily.     . potassium chloride (K-DUR) 10 MEQ tablet Take 1 tablet (10 mEq total) by mouth daily. 30 tablet 2  . predniSONE (STERAPRED UNI-PAK 21 TAB) 10 MG (21) TBPK tablet Take as directed on pack 21 tablet 0  . QUEtiapine Fumarate (SEROQUEL PO) Take by mouth.     No current facility-administered medications on file prior to visit.     Cardiovascular studies:  EKG 04/16/2018: Sinus rhythm 100 bpm. First degree AV block. Left atrial enlargement. Poor R wave progression.  Echocardiogram 04/21/2017: Left ventricle cavity is normal in size. Mild concentric remodeling of the left ventricle. Mild decrease in global wall motion. Visual EF is 45-50%. Doppler evidence of grade I (impaired) diastolic dysfunction, normal LAP.  Left atrial cavity is mildly dilated.  Recent labs: None recently  Review of Systems  Constitution: Negative for decreased appetite, malaise/fatigue, weight gain and weight loss.  HENT: Negative for congestion.   Eyes: Negative for visual disturbance.  Cardiovascular: Positive for dyspnea on exertion and leg swelling. Negative for chest pain, palpitations and syncope.   Respiratory: Positive for shortness of breath.   Endocrine: Negative for cold intolerance.  Hematologic/Lymphatic: Does not bruise/bleed easily.  Skin: Negative for itching and rash.  Musculoskeletal: Negative for myalgias.  Gastrointestinal: Negative for abdominal pain, nausea and vomiting.  Genitourinary: Negative for dysuria.  Neurological: Negative for dizziness and weakness.  Psychiatric/Behavioral: The patient is not nervous/anxious.   All other systems reviewed and are negative.        Vitals:   11/25/18 1453 11/25/18 1454  BP: (!) 173/102 (!) 169/117  Pulse: 94 94   Filed Weights   11/25/18 1454  Weight: (!) 465 lb (210.9 kg)     Observation/findings during video visit   Objective:    Physical Exam  Constitutional: She is oriented to person, place, and time. She appears well-developed. No distress.  Morbidly obese  Pulmonary/Chest: Effort normal.  Neurological: She is alert and oriented to person, place, and time.  Psychiatric: She has a normal mood and affect.  Nursing note and vitals reviewed.         Assessment & Recommendations:   48 year old African-American female with hypertension, hyperlipidemia, type 2 DM, morbid obesity, hyperlipidemia   Hypertension: Uncontrolled stage 2 hypertension. Increase Bidil 20-37.5 mg to 1 tab tid Increase Coreg to 25 mg bid. Continue lasix 40 mg daily.  Morbid obesity: Strongly recommend considering surgical weight loss, in addition to diet and lifestyle changes. She is motivated and wants to lose weight.  Hyperlipidmia: Not on statin. Needs repeat lipid panel at some point  Type 2 DM: Follow up with PCP.   Nigel Mormon, MD [facility] Pager: [phone] Office: [phone] If no answer Cell [phone]

## 2018-11-26 ENCOUNTER — Encounter: Payer: Self-pay | Admitting: Cardiology

## 2019-02-01 ENCOUNTER — Other Ambulatory Visit: Payer: Self-pay | Admitting: Family Medicine

## 2019-02-01 DIAGNOSIS — I10 Essential (primary) hypertension: Secondary | ICD-10-CM

## 2019-03-03 ENCOUNTER — Encounter (HOSPITAL_COMMUNITY): Payer: Self-pay | Admitting: *Deleted

## 2019-03-03 ENCOUNTER — Ambulatory Visit: Payer: Self-pay | Admitting: Orthopaedic Surgery

## 2019-03-03 ENCOUNTER — Encounter (HOSPITAL_COMMUNITY): Payer: Self-pay

## 2019-03-04 ENCOUNTER — Ambulatory Visit: Payer: Medicaid Other | Admitting: Cardiology

## 2019-03-10 ENCOUNTER — Other Ambulatory Visit: Payer: Self-pay

## 2019-03-10 ENCOUNTER — Encounter: Payer: Self-pay | Admitting: Family Medicine

## 2019-03-10 ENCOUNTER — Ambulatory Visit (INDEPENDENT_AMBULATORY_CARE_PROVIDER_SITE_OTHER): Payer: Medicaid Other | Admitting: Family Medicine

## 2019-03-10 VITALS — BP 144/95 | HR 82 | Temp 98.1°F | Resp 18 | Ht 65.0 in

## 2019-03-10 DIAGNOSIS — E119 Type 2 diabetes mellitus without complications: Secondary | ICD-10-CM | POA: Diagnosis not present

## 2019-03-10 DIAGNOSIS — N611 Abscess of the breast and nipple: Secondary | ICD-10-CM

## 2019-03-10 DIAGNOSIS — I10 Essential (primary) hypertension: Secondary | ICD-10-CM

## 2019-03-10 DIAGNOSIS — Z7409 Other reduced mobility: Secondary | ICD-10-CM

## 2019-03-10 DIAGNOSIS — Z23 Encounter for immunization: Secondary | ICD-10-CM | POA: Diagnosis not present

## 2019-03-10 DIAGNOSIS — M5126 Other intervertebral disc displacement, lumbar region: Secondary | ICD-10-CM | POA: Diagnosis not present

## 2019-03-10 DIAGNOSIS — D500 Iron deficiency anemia secondary to blood loss (chronic): Secondary | ICD-10-CM | POA: Diagnosis not present

## 2019-03-10 MED ORDER — DOXYCYCLINE HYCLATE 100 MG PO TABS: 100.0000 mg | ORAL_TABLET | Freq: Two times a day (BID) | ORAL | 0 refills | Status: DC

## 2019-03-10 MED ORDER — MUPIROCIN 2 % EX OINT: 1.0000 "application " | TOPICAL_OINTMENT | Freq: Two times a day (BID) | CUTANEOUS | 0 refills | Status: DC

## 2019-03-10 MED ORDER — GABAPENTIN 600 MG PO TABS: 600.0000 mg | ORAL_TABLET | Freq: Two times a day (BID) | ORAL | 3 refills | Status: DC | PRN

## 2019-03-10 NOTE — Progress Notes (Addendum)
Patient [facility]   Progress Note: General Provider: Lanae Boast, FNP  SUBJECTIVE:   Cynthia Becker is a 48 y.o. female who  has a past medical history of Anemia, Anxiety, Arthritis, Asthma, Depression, H/O dizziness, Headache(784.0), High blood pressure, and SVD (spontaneous vaginal delivery).. Patient presents today for Follow-up (asking for wheelchair for home use ) and Breast Problem (sores on breast (both) )  Patient states that she is having increased difficulty with ambulation due to arthritis and obesity. She is requesting an Systems developer. She states that she will be unable to push a manual wheelchair due to her obesity and   She also states that she has sores on her bilateral breasts that was and wean. Today, there is an open sore on the right breast that she states has been present for several weeks. She denies fever, chills, or night sweats.     Review of Systems  Constitutional: Negative.   HENT: Negative.   Eyes: Negative.   Respiratory: Negative.   Cardiovascular: Negative.   Gastrointestinal: Negative.   Genitourinary: Negative.   Musculoskeletal: Positive for back pain and joint pain.  Skin: Positive for rash (open sores on breast).  Neurological: Negative.   Psychiatric/Behavioral: Negative.      OBJECTIVE: BP (!) 144/95 (BP Location: Left Arm, Patient Position: Sitting, Cuff Size: Large)   Pulse 82   Temp 98.1 F (36.7 C) (Oral)   Resp 18   Ht 5\' 5"  (1.651 m)   LMP 02/13/2014   SpO2 98%   BMI 77.38 kg/m   Wt Readings from Last 3 Encounters:  11/25/18 (!) 465 lb (210.9 kg)  10/15/18 (!) 445 lb (201.9 kg)  10/12/18 (!) 395 lb (179.2 kg)     Physical Exam Vitals signs and nursing note reviewed.  Constitutional:      General: She is not in acute distress.    Appearance: Normal appearance.  HENT:     Head: Normocephalic and atraumatic.  Eyes:     Extraocular Movements: Extraocular movements intact.      Conjunctiva/sclera: Conjunctivae normal.     Pupils: Pupils are equal, round, and reactive to light.  Cardiovascular:     Rate and Rhythm: Normal rate and regular rhythm.     Heart sounds: No murmur.  Pulmonary:     Effort: Pulmonary effort is normal.     Breath sounds: Normal breath sounds.  Musculoskeletal: Normal range of motion.  Skin:    General: Skin is warm and dry.     Findings: Lesion present.       Neurological:     Mental Status: She is alert and oriented to person, place, and time.     Motor: Weakness present.  Psychiatric:        Mood and Affect: Mood normal.        Behavior: Behavior normal.        Thought Content: Thought content normal.        Judgment: Judgment normal.     ASSESSMENT/PLAN:   1. Hypertension, unspecified type  2. Type 2 diabetes mellitus without complication, unspecified whether long term insulin use ([place])  3. Iron deficiency anemia due to chronic blood loss  4. DEGENERATIVE DISC DISEASE, LUMBOSACRAL SPINE W/RADICULOPATHY - gabapentin (NEURONTIN) 600 MG tablet; Take 1 tablet (600 mg total) by mouth 2 (two) times daily as needed.  Dispense: 60 tablet; Refill: 3  5. Limited mobility Requesting an electric chair due to the inability to push  herself because of morbid obesity and decreased upper extremity strength. - DME Wheelchair electric  6. Morbid obesity ([place]) Requesting an electric chair due to the inability to push herself because of morbid obesity and decreased upper extremity strength.  - DME Wheelchair electric  7. Abscess of breast Advised patient to allow wounds to heal and to stop removing any scabs or skin from them.  - mupirocin ointment (BACTROBAN) 2 %; Place 1 application into the nose 2 (two) times daily. Apply to the area BID  Dispense: 30 g; Refill: 0 - doxycycline (VIBRA-TABS) 100 MG tablet; Take 1 tablet (100 mg total) by mouth 2 (two) times daily.  Dispense: 20 tablet; Refill: 0   Need for power chair:   Ms.  Tschida is unable to ambulate with the use of a cane/walker due to arthritis and obesity, unsteady gait and risk of falls. She has the inability to transfer on/off a scooter, inability to steer tiller steering on scooter, and the scooter lacks postural support.   At the present time her upper and lower extremities bilateral strength is rated as a 4/5.   Her joint pain is 9/10 on the pain scale.   By receiving a power wheel chair, this can assister her in meal preperation, bathing, and dressing.    At this time, Ms. Latu  has the mental and physical ability to operate a power wheelchair.   Return in about 3 months (around 06/09/2019).    The patient was given clear instructions to go to ER or return to medical center if symptoms do not improve, worsen or new problems develop. The patient verbalized understanding and agreed with plan of care.   Ms. Doug Sou. Nathaneil Canary, FNP-BC [facility] [address] [place], [place] [address] [phone]

## 2019-03-12 ENCOUNTER — Encounter: Payer: Self-pay | Admitting: Family Medicine

## 2019-05-27 ENCOUNTER — Telehealth: Payer: Medicaid Other

## 2019-06-09 ENCOUNTER — Ambulatory Visit: Payer: Medicaid Other | Admitting: Family Medicine

## 2019-06-15 ENCOUNTER — Other Ambulatory Visit: Payer: Self-pay

## 2019-06-15 ENCOUNTER — Ambulatory Visit (INDEPENDENT_AMBULATORY_CARE_PROVIDER_SITE_OTHER): Payer: Medicaid Other | Admitting: Family Medicine

## 2019-06-15 ENCOUNTER — Encounter: Payer: Self-pay | Admitting: Family Medicine

## 2019-06-15 DIAGNOSIS — E119 Type 2 diabetes mellitus without complications: Secondary | ICD-10-CM

## 2019-06-15 DIAGNOSIS — Z8709 Personal history of other diseases of the respiratory system: Secondary | ICD-10-CM

## 2019-06-15 DIAGNOSIS — I10 Essential (primary) hypertension: Secondary | ICD-10-CM

## 2019-06-15 DIAGNOSIS — R0989 Other specified symptoms and signs involving the circulatory and respiratory systems: Secondary | ICD-10-CM | POA: Diagnosis not present

## 2019-06-15 MED ORDER — CORICIDIN HBP CONGESTION/COUGH 10-200 MG PO CAPS: 1.0000 | ORAL_CAPSULE | Freq: Four times a day (QID) | ORAL | 0 refills | Status: DC | PRN

## 2019-06-15 MED ORDER — AZITHROMYCIN 250 MG PO TABS: ORAL_TABLET | ORAL | 0 refills | Status: DC

## 2019-06-15 NOTE — Progress Notes (Signed)
[facility]   Virtual Visit via Telephone Note  I connected with Diksha Feo on 06/15/19 at 11:20 AM EST by telephone and verified that I am speaking with the correct person using two identifiers.   I discussed the limitations, risks, security and privacy concerns of performing an evaluation and management service by telephone and the availability of in person appointments. I also discussed with the patient that there may be a patient responsible charge related to this service. The patient expressed understanding and agreed to proceed.   History of Present Illness: Cynthia Becker, a 48 year old female with a medical history significant for type 2 diabetes mellitus, essential hypertension, morbid obesity, and history of mild intermittent asthma is complaining of upper respiratory symptoms for greater than 1 week.  Patient symptoms include fever, chills, congestion, cough, headaches, joint pain, sore throat, and wheezing.  Patient has not been tested for COVID-19 infection.  She has tried over-the-counter medications without sustained relief.  She says that symptoms are worsening and have been present for greater than 1 week.  URI  This is a new problem. The current episode started 1 to 4 weeks ago. The problem has been unchanged. The maximum temperature recorded prior to her arrival was 100.4 - 100.9 F. The fever has been present for 1 to 2 days. Associated symptoms include congestion, coughing, headaches, joint pain, a sore throat and wheezing. Pertinent negatives include no abdominal pain, diarrhea, dysuria, ear pain, neck pain, rash, rhinorrhea or sinus pain. She has tried acetaminophen and inhaler use for the symptoms. The treatment provided no relief.     Past Medical History:  Diagnosis Date  . Anemia    iv iron treatment dec 2014/ see oncology for this last in dec 2014  . Anxiety   . Arthritis    knee, hands  . Asthma    seasonal  related/ albuterol used when uri  . Depression   . H/O dizziness    Hx  . Headache(784.0)    otc med prn  . High blood pressure   . SVD (spontaneous vaginal delivery)    x 3   Past Surgical History:  Procedure Laterality Date  . ABDOMINAL HYSTERECTOMY  03/13/2014   total   . BILATERAL SALPINGECTOMY Bilateral 04/12/2014   Procedure: BILATERAL SALPINGECTOMY;  Surgeon: Woodroe Mode, MD;  Location: [facility];  Service: Gynecology;  Laterality: Bilateral;  . CARPAL TUNNEL RELEASE  08/30/2011   Procedure: CARPAL TUNNEL RELEASE;  Surgeon: Wynonia Sours, MD;  Location: [facility];  Service: Orthopedics;  Laterality: Right;  . CHOLECYSTECTOMY  10/11   lap choli  . GALLBLADDER SURGERY  04/04/2010  . HYSTEROSCOPY N/A 02/08/2014   Procedure: HYSTEROSCOPY;  Surgeon: Woodroe Mode, MD;  Location: [facility];  Service: Gynecology;  Laterality: N/A;  . HYSTEROSCOPY WITH NOVASURE N/A 02/08/2014   Procedure: ATTEMPTED NOVASURE ABLATION;  Surgeon: Woodroe Mode, MD;  Location: [facility];  Service: Gynecology;  Laterality: N/A;  . INTRAUTERINE DEVICE INSERTION     07/19/2013  . SEPTOPLASTY N/A 09/08/2013   Procedure: SEPTOPLASTY;  Surgeon: Ruby Cola, MD;  Location: [facility];  Service: ENT;  Laterality: N/A;  . TONSILLECTOMY AND ADENOIDECTOMY Bilateral 09/08/2013   Procedure: TONSILLECTOMY ;  Surgeon: Ruby Cola, MD;  Location: [facility];  Service: ENT;  Laterality: Bilateral;  . TUBAL LIGATION    . VAGINAL HYSTERECTOMY N/A 04/12/2014   Procedure: LAPAROSCOPIC ASSISTED VAGINAL HYSTERECTOMY;  Surgeon: Woodroe Mode,  MD;  Location: [facility];  Service: Gynecology;  Laterality: N/A;  . WISDOM TOOTH EXTRACTION     Immunization History  Administered Date(s) Administered  . Influenza Whole 04/25/2010  . Influenza,inj,Quad PF,6+ Mos 03/07/2014, 03/10/2019  . Pneumococcal Polysaccharide-23 04/13/2014  . Td 08/02/2010  No Known Allergies Observations/Objective:   Assessment and Plan: 1. Symptoms of  upper respiratory infection (URI) Patient advised to schedule online appointment for COVID-19 test at drive up testing site.  Discussed instructions at length Due to current symptoms, patient advised to isolate at home. Also, increase fluid intake, rest, handwashing.  Wear mask and distance with close contacts. - Dextromethorphan-guaiFENesin (CORICIDIN HBP CONGESTION/COUGH) 10-200 MG CAPS; Take 1 capsule by mouth every 6 (six) hours as needed.  Dispense: 168 capsule; Refill: 0 - azithromycin (ZITHROMAX) 250 MG tablet; Take 500 mg day 1, take 250 mg on days 2-5.  Dispense: 6 tablet; Refill: 0  2. Hypertension, unspecified type Continue antihypertensive medications as prescribed.  Follow-up in clinic in 1 month.  3. Type 2 diabetes mellitus without complication, unspecified whether long term insulin use ([facility]) Continue antidiabetic medications consistently.  Bring glucometer to follow-up appointment.  4. History of asthma No medication changes on today.  Will evaluate further at follow-up.  Follow Up Instructions:    I discussed the assessment and treatment plan with the patient. The patient was provided an opportunity to ask questions and all were answered. The patient agreed with the plan and demonstrated an understanding of the instructions.   The patient was advised to call back or seek an in-person evaluation if the symptoms worsen or if the condition fails to improve as anticipated.  I provided 10 minutes of non-face-to-face time during this encounter.   Cynthia Pounds  APRN, MSN, FNP-C [facility] [address] [place], [place] [address] [phone]

## 2019-06-21 ENCOUNTER — Other Ambulatory Visit: Payer: Medicaid Other

## 2019-06-28 ENCOUNTER — Telehealth: Payer: Self-pay

## 2019-06-28 ENCOUNTER — Telehealth: Payer: Medicaid Other

## 2019-06-28 NOTE — Telephone Encounter (Signed)
Concerned regarding hypertensive urgency. Recommend calling 911 and be seen in ER. Alternatively, I may see her for urgent visit tomorrow 1/5.  Thanks MJP

## 2019-06-28 NOTE — Telephone Encounter (Signed)
Pt aware and will schedule something in case she doesn't go to ER today

## 2019-06-28 NOTE — Telephone Encounter (Signed)
Telephone encounter:  Reason for call: Pt called c/o bp being high 195/124 and it gets as high as 233/135 and she is having HA's, sob, and sometimes she has the "shakes"   Usual provider: MP  Last office visit: 11/25/2018  Next office visit: 07/26/2019   Last hospitalization:    Current Outpatient Medications on File Prior to Visit  Medication Sig Dispense Refill  . ACCU-CHEK FASTCLIX LANCETS MISC USE TO CHECK BLOOD SUGAR TWICE DAILY  11  . ACCU-CHEK GUIDE test strip USE 1 STRIP TWICE DAILY  11  . amitriptyline (ELAVIL) 50 MG tablet Take 1 tablet by mouth as needed.    Mar[place] Kitchen azithromycin (ZITHROMAX) 250 MG tablet Take 500 mg day 1, take 250 mg on days 2-5. 6 tablet 0  . carvedilol (COREG) 25 MG tablet Take 1 tablet (25 mg total) by mouth 2 (two) times daily. 120 tablet 2  . cyclobenzaprine (FLEXERIL) 10 MG tablet Take 1 tablet (10 mg total) by mouth at bedtime. (Patient not taking: Reported on 11/25/2018) 30 tablet 0  . Dextromethorphan-guaiFENesin (CORICIDIN HBP CONGESTION/COUGH) 10-200 MG CAPS Take 1 capsule by mouth every 6 (six) hours as needed. 168 capsule 0  . fluticasone (FLONASE) 50 MCG/ACT nasal spray Place 1 spray into both nostrils daily. 16 g 2  . fluticasone (FLOVENT HFA) 110 MCG/ACT inhaler Inhale 1 puff into the lungs 2 (two) times daily. 1 Inhaler 12  . gabapentin (NEURONTIN) 600 MG tablet Take 1 tablet (600 mg total) by mouth 2 (two) times daily as needed. 60 tablet 3  . ibuprofen (ADVIL,MOTRIN) 800 MG tablet Take 800 mg by mouth every 8 (eight) hours as needed for moderate pain.    . isosorbide-hydrALAZINE (BIDIL) 20-37.5 MG tablet Take 1 tablet by mouth 2 (two) times a day. (Patient not taking: Reported on 03/10/2019) 120 tablet 2  . lamoTRIgine (LAMICTAL PO) Take by mouth.    . lidocaine (LIDODERM) 5 % Place 1 patch onto the skin daily as needed (pain).   5  . loratadine (CLARITIN) 10 MG tablet Take 1 tablet (10 mg total) by mouth daily. (Patient taking differently: Take 10 mg  by mouth as needed. ) 30 tablet 11  . meclizine (ANTIVERT) 25 MG tablet Take 1 tablet (25 mg total) by mouth 3 (three) times daily as needed for dizziness. 30 tablet 0  . metFORMIN (GLUCOPHAGE-XR) 750 MG 24 hr tablet Take 750 mg by mouth as needed.     . montelukast (SINGULAIR) 10 MG tablet Take 1 tablet (10 mg total) by mouth at bedtime. 30 tablet 3  . mupirocin ointment (BACTROBAN) 2 % Place 1 application into the nose 2 (two) times daily. Apply to the area BID 30 g 0  . Oxycodone HCl 10 MG TABS Take 10 mg by mouth 5 (five) times daily.     . potassium chloride (K-DUR) 10 MEQ tablet Take 1 tablet (10 mEq total) by mouth daily. 30 tablet 2  . QUEtiapine Fumarate (SEROQUEL PO) Take by mouth.     No current facility-administered medications on file prior to visit.

## 2019-06-29 ENCOUNTER — Ambulatory Visit (INDEPENDENT_AMBULATORY_CARE_PROVIDER_SITE_OTHER): Payer: Medicaid Other | Admitting: Cardiology

## 2019-06-29 DIAGNOSIS — Z5329 Procedure and treatment not carried out because of patient's decision for other reasons: Secondary | ICD-10-CM

## 2019-06-29 NOTE — Progress Notes (Signed)
No show

## 2019-06-30 DIAGNOSIS — Z5329 Procedure and treatment not carried out because of patient's decision for other reasons: Secondary | ICD-10-CM | POA: Insufficient documentation

## 2019-06-30 DIAGNOSIS — Z91199 Patient's noncompliance with other medical treatment and regimen due to unspecified reason: Secondary | ICD-10-CM | POA: Insufficient documentation

## 2019-07-06 ENCOUNTER — Telehealth: Payer: Self-pay | Admitting: Nurse Practitioner

## 2019-07-06 NOTE — Telephone Encounter (Signed)
Called and spoke with [employer] [facility] this is where order was faxed over on 03/10/2019. Spoke with Mardene Celeste she states that they are waiting on a "price quote from manufacture". She advised that she would get with the representative and then also reach out to patient with information regarding this. I called Cynthia Becker no answer but I did leave message that [employer] would reach out to her regarding this matter. Thanks!

## 2019-07-08 ENCOUNTER — Other Ambulatory Visit: Payer: Self-pay

## 2019-07-08 ENCOUNTER — Emergency Department (HOSPITAL_COMMUNITY)
Admission: EM | Admit: 2019-07-08 | Discharge: 2019-07-08 | Disposition: A | Discharge status: Home / Self Care | Payer: Medicaid Other | Attending: Emergency Medicine | Admitting: Emergency Medicine

## 2019-07-08 ENCOUNTER — Encounter (HOSPITAL_COMMUNITY): Payer: Self-pay | Admitting: Emergency Medicine

## 2019-07-08 ENCOUNTER — Emergency Department (HOSPITAL_COMMUNITY): Payer: Medicaid Other

## 2019-07-08 DIAGNOSIS — Z20822 Contact with and (suspected) exposure to covid-19: Secondary | ICD-10-CM | POA: Diagnosis not present

## 2019-07-08 DIAGNOSIS — F17210 Nicotine dependence, cigarettes, uncomplicated: Secondary | ICD-10-CM | POA: Insufficient documentation

## 2019-07-08 DIAGNOSIS — Z79899 Other long term (current) drug therapy: Secondary | ICD-10-CM | POA: Diagnosis not present

## 2019-07-08 DIAGNOSIS — I10 Essential (primary) hypertension: Secondary | ICD-10-CM | POA: Diagnosis not present

## 2019-07-08 DIAGNOSIS — J45901 Unspecified asthma with (acute) exacerbation: Secondary | ICD-10-CM | POA: Diagnosis not present

## 2019-07-08 DIAGNOSIS — J209 Acute bronchitis, unspecified: Secondary | ICD-10-CM

## 2019-07-08 DIAGNOSIS — R0600 Dyspnea, unspecified: Secondary | ICD-10-CM | POA: Diagnosis present

## 2019-07-08 LAB — CBC WITH DIFFERENTIAL/PLATELET
Abs Immature Granulocytes: 0.07 10*3/uL (ref 0.00–0.07)
Basophils Absolute: 0.1 10*3/uL (ref 0.0–0.1)
Basophils Relative: 1 %
Eosinophils Absolute: 0.1 10*3/uL (ref 0.0–0.5)
Eosinophils Relative: 1 %
HCT: 50.9 % — ABNORMAL HIGH (ref 36.0–46.0)
Hemoglobin: 15.4 g/dL — ABNORMAL HIGH (ref 12.0–15.0)
Immature Granulocytes: 1 %
Lymphocytes Relative: 20 %
Lymphs Abs: 1.5 10*3/uL (ref 0.7–4.0)
MCH: 22.9 pg — ABNORMAL LOW (ref 26.0–34.0)
MCHC: 30.3 g/dL (ref 30.0–36.0)
MCV: 75.6 fL — ABNORMAL LOW (ref 80.0–100.0)
Monocytes Absolute: 0.5 10*3/uL (ref 0.1–1.0)
Monocytes Relative: 7 %
Neutro Abs: 5.3 10*3/uL (ref 1.7–7.7)
Neutrophils Relative %: 70 %
Platelets: 301 10*3/uL (ref 150–400)
RBC: 6.73 MIL/uL — ABNORMAL HIGH (ref 3.87–5.11)
RDW: 20.1 % — ABNORMAL HIGH (ref 11.5–15.5)
WBC: 7.6 10*3/uL (ref 4.0–10.5)
nRBC: 1.6 % — ABNORMAL HIGH (ref 0.0–0.2)

## 2019-07-08 LAB — COMPREHENSIVE METABOLIC PANEL
ALT: 20 U/L (ref 0–44)
AST: 17 U/L (ref 15–41)
Albumin: 3.8 g/dL (ref 3.5–5.0)
Alkaline Phosphatase: 74 U/L (ref 38–126)
Anion gap: 9 (ref 5–15)
BUN: 11 mg/dL (ref 6–20)
CO2: 34 mmol/L — ABNORMAL HIGH (ref 22–32)
Calcium: 9.4 mg/dL (ref 8.9–10.3)
Chloride: 95 mmol/L — ABNORMAL LOW (ref 98–111)
Creatinine, Ser: 0.73 mg/dL (ref 0.44–1.00)
GFR calc Af Amer: >60 mL/min (ref >60)
GFR calc non Af Amer: >60 mL/min (ref >60)
Glucose, Bld: 123 mg/dL — ABNORMAL HIGH (ref 70–99)
Potassium: 4.8 mmol/L (ref 3.5–5.1)
Sodium: 138 mmol/L (ref 135–145)
Total Bilirubin: 0.9 mg/dL (ref 0.3–1.2)
Total Protein: 8.4 g/dL — ABNORMAL HIGH (ref 6.5–8.1)

## 2019-07-08 LAB — RESPIRATORY PANEL BY RT PCR (FLU A&B, COVID)
Influenza A by PCR: NEGATIVE
Influenza B by PCR: NEGATIVE
SARS Coronavirus 2 by RT PCR: NEGATIVE

## 2019-07-08 LAB — BRAIN NATRIURETIC PEPTIDE: B Natriuretic Peptide: 93.1 pg/mL (ref 0.0–100.0)

## 2019-07-08 MED ORDER — PREDNISONE 20 MG PO TABS: 40.0000 mg | ORAL_TABLET | Freq: Every day | ORAL | 0 refills | Status: DC

## 2019-07-08 MED ORDER — DEXAMETHASONE SODIUM PHOSPHATE 10 MG/ML IJ SOLN
10.0000 mg | Freq: Once | INTRAMUSCULAR | Status: AC
  Administered 2019-07-08: 10 mg via INTRAVENOUS
  Filled 2019-07-08: qty 1

## 2019-07-08 MED ORDER — ALBUTEROL SULFATE HFA 108 (90 BASE) MCG/ACT IN AERS
2.0000 | INHALATION_SPRAY | Freq: Four times a day (QID) | RESPIRATORY_TRACT | Status: DC
  Administered 2019-07-08: 23:00:00 2 via RESPIRATORY_TRACT
  Filled 2019-07-08: qty 6.7

## 2019-07-08 NOTE — ED Notes (Signed)
Pt normally on O2 at home via nasal canula but reports unsure how many liters. Came in without any O2.

## 2019-07-08 NOTE — ED Triage Notes (Signed)
Pt being very tired since yesterday. Pt adds having HTN.

## 2019-07-08 NOTE — Discharge Instructions (Addendum)
As discussed, today's evaluation has been generally reassuring. There is evidence for bronchitis contributing to your difficulty breathing. It is important to take the steroids as prescribed, and use your albuterol every 4 hours for the next 2 days. You may also elect to use your home oxygen for additional comfort. If you develop new, or concerning changes return here.

## 2019-07-08 NOTE — ED Notes (Signed)
Patient states she's usually on oxygen at home, but she does not know how much she uses.

## 2019-07-08 NOTE — ED Notes (Signed)
When this RN entered patient's room, the patient was sitting on the trash can urinating into it. Patient said that she was unsure how to use the bedside commode provided for her. Unable to collect urine sample at this time.

## 2019-07-08 NOTE — ED Provider Notes (Addendum)
[facility] DEPT Provider Note   CSN: [id number] Arrival date & time: 07/08/19  1844     History Chief Complaint  Patient presents with  . Fatigue  . Hypertension    Surenity Becker is a 49 y.o. female.  HPI    Patient presents with concern of dyspnea, fatigue.  Patient is a poor historian, but it seems as though over the past weeks she has become worse, more so over the past days. No focal pain, though she does have diffuse discomfort, diffuse fatigue.  Is unclear if the dyspnea is worse with exertion or activity but it is persistent. She wears oxygen as needed at home, but not 24/7. She notes that today with worsening dyspnea she called for assistance. She felt particularly better after receiving supplemental oxygen via nonrebreather mask, but now, on arrival she is on nasal cannula, notes that she feels somewhat worse. Past Medical History:  Diagnosis Date  . Anemia    iv iron treatment dec 2014/ see oncology for this last in dec 2014  . Anxiety   . Arthritis    knee, hands  . Asthma    seasonal related/ albuterol used when uri  . Depression   . H/O dizziness    Hx  . Headache(784.0)    otc med prn  . High blood pressure   . SVD (spontaneous vaginal delivery)    x 3    Patient Active Problem List   Diagnosis Date Noted  . No-show for appointment 06/30/2019  . Morbid obesity with BMI of 60.0-69.9, adult ([place]) 09/14/2014  . Iron deficiency anemia due to chronic blood loss 09/14/2014  . CN (constipation) 09/14/2014  . Postoperative state 04/12/2014  . Uterine fibroid 03/17/2014  . DUB (dysfunctional uterine bleeding) 01/27/2014  . Iron deficiency anemia due to chronic blood loss 04/09/2013  . 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 Menometrorrhagia 04/09/2013  . Left hand pain 09/18/2011  . High blood pressure   . ANEMIA, IRON DEFICIENCY 08/06/2010  . CHOLECYSTECTOMY, HX OF 04/09/2010  . HYPERLIPIDEMIA 02/13/2010  . DEGENERATIVE  DISC DISEASE, LUMBOSACRAL SPINE W/RADICULOPATHY 11/01/2009  . BACK PAIN, LUMBAR, WITH RADICULOPATHY 10/30/2009  . TRIGGER FINGER 02/03/2009  . CARPAL TUNNEL SYNDROME 12/21/2008  . HEADACHE 11/09/2008  . DEPRESSION 10/04/2008  . URI 08/24/2008  . INSOMNIA 08/24/2008  . ASTHMA 06/06/2008  . Morbid obesity ([place]) 05/24/2008  . ESSENTIAL HYPERTENSION 05/24/2008    Past Surgical History:  Procedure Laterality Date  . ABDOMINAL HYSTERECTOMY  03/13/2014   total   . BILATERAL SALPINGECTOMY Bilateral 04/12/2014   Procedure: BILATERAL SALPINGECTOMY;  Surgeon: Woodroe Mode, MD;  Location: [facility];  Service: Gynecology;  Laterality: Bilateral;  . CARPAL TUNNEL RELEASE  08/30/2011   Procedure: CARPAL TUNNEL RELEASE;  Surgeon: Wynonia Sours, MD;  Location: [facility];  Service: Orthopedics;  Laterality: Right;  . CHOLECYSTECTOMY  10/11   lap choli  . GALLBLADDER SURGERY  04/04/2010  . HYSTEROSCOPY N/A 02/08/2014   Procedure: HYSTEROSCOPY;  Surgeon: Woodroe Mode, MD;  Location: [facility];  Service: Gynecology;  Laterality: N/A;  . HYSTEROSCOPY WITH NOVASURE N/A 02/08/2014   Procedure: ATTEMPTED NOVASURE ABLATION;  Surgeon: Woodroe Mode, MD;  Location: [facility];  Service: Gynecology;  Laterality: N/A;  . INTRAUTERINE DEVICE INSERTION     07/19/2013  . SEPTOPLASTY N/A 09/08/2013   Procedure: SEPTOPLASTY;  Surgeon: Ruby Cola, MD;  Location: [facility];  Service: ENT;  Laterality: N/A;  . TONSILLECTOMY AND  ADENOIDECTOMY Bilateral 09/08/2013   Procedure: TONSILLECTOMY ;  Surgeon: Ruby Cola, MD;  Location: [facility];  Service: ENT;  Laterality: Bilateral;  . TUBAL LIGATION    . VAGINAL HYSTERECTOMY N/A 04/12/2014   Procedure: LAPAROSCOPIC ASSISTED VAGINAL HYSTERECTOMY;  Surgeon: Woodroe Mode, MD;  Location: [facility];  Service: Gynecology;  Laterality: N/A;  . WISDOM TOOTH EXTRACTION       OB History    Gravida  3   Para  3   Term  3   Preterm  0   AB  0   Living  3     SAB  0     TAB  0   Ectopic  0   Multiple  0   Live Births              Family History  Problem Relation Age of Onset  . Diabetes Mother   . Hypertension Father   . Heart disease Other   . Diabetes Other   . Hypertension Other   . Alcohol abuse Other     Social History   Tobacco Use  . Smoking status: Current Some Day Smoker    Packs/day: 0.25    Types: Cigarettes    Last attempt to quit: 02/11/2016    Years since quitting: 3.4  . Smokeless tobacco: Never Used  Substance Use Topics  . Alcohol use: No  . Drug use: No    Home Medications Prior to Admission medications   Medication Sig Start Date End Date Taking? Authorizing Provider  amitriptyline (ELAVIL) 50 MG tablet Take 1 tablet by mouth as needed for sleep.  10/07/18  Yes [provider]  carvedilol (COREG) 25 MG tablet Take 1 tablet (25 mg total) by mouth 2 (two) times daily. 11/25/18 07/08/19 Yes Patwardhan, Manish J, MD  Dextromethorphan-guaiFENesin (CORICIDIN HBP CONGESTION/COUGH) 10-200 MG CAPS Take 1 capsule by mouth every 6 (six) hours as needed. 06/15/19  Yes Dorena Dew, FNP  fluticasone (FLOVENT HFA) 110 MCG/ACT inhaler Inhale 1 puff into the lungs 2 (two) times daily. 09/03/18  Yes Lanae Boast, FNP  furosemide (LASIX) 20 MG tablet Take 20 mg by mouth daily. 06/24/19  Yes [provider]  gabapentin (NEURONTIN) 600 MG tablet Take 1 tablet (600 mg total) by mouth 2 (two) times daily as needed. 03/10/19  Yes Lanae Boast, FNP  ibuprofen (ADVIL,MOTRIN) 800 MG tablet Take 800 mg by mouth every 8 (eight) hours as needed for moderate pain.   Yes [provider]  lidocaine (LIDODERM) 5 % Place 1 patch onto the skin daily as needed (pain).  03/22/18  Yes [provider]  metFORMIN (GLUCOPHAGE-XR) 750 MG 24 hr tablet Take 750 mg by mouth as needed.    Yes [provider]  montelukast (SINGULAIR) 10 MG tablet Take 1 tablet (10 mg total) by mouth at bedtime. 09/11/18  Yes  Lanae Boast, FNP  omeprazole (PRILOSEC) 20 MG capsule Take 20 mg by mouth daily. 05/06/19  Yes [provider]  Oxycodone HCl 10 MG TABS Take 10 mg by mouth every 6 (six) hours as needed (pain).    Yes [provider]  potassium chloride (K-DUR) 10 MEQ tablet Take 1 tablet (10 mEq total) by mouth daily. 09/11/18  Yes Lanae Boast, FNP  QUEtiapine Fumarate (SEROQUEL PO) Take by mouth.   Yes [provider]  XTAMPZA ER 13.5 MG C12A Take 1 capsule by mouth every 12 (twelve) hours. 06/14/19  Yes [provider]  ACCU-CHEK  FASTCLIX LANCETS MISC USE TO CHECK BLOOD SUGAR TWICE DAILY 03/23/18   [provider]  ACCU-CHEK GUIDE test strip USE 1 [place] 03/30/18   [provider]  azithromycin (ZITHROMAX) 250 MG tablet Take 500 mg day 1, take 250 mg on days 2-5. Patient not taking: Reported on 07/08/2019 06/15/19   Dorena Dew, FNP  cyclobenzaprine (FLEXERIL) 10 MG tablet Take 1 tablet (10 mg total) by mouth at bedtime. Patient not taking: Reported on 11/25/2018 08/17/18   Lanae Boast, FNP  fluticasone [facility]) 50 MCG/ACT nasal spray Place 1 spray into both nostrils daily. Patient not taking: Reported on 07/08/2019 11/13/18   Chase Picket, MD  isosorbide-hydrALAZINE (BIDIL) 20-37.5 MG tablet Take 1 tablet by mouth 2 (two) times a day. Patient not taking: Reported on 03/10/2019 11/25/18   Nigel Mormon, MD  loratadine (CLARITIN) 10 MG tablet Take 1 tablet (10 mg total) by mouth daily. Patient taking differently: Take 10 mg by mouth as needed.  09/11/18   Lanae Boast, FNP  meclizine (ANTIVERT) 25 MG tablet Take 1 tablet (25 mg total) by mouth 3 (three) times daily as needed for dizziness. Patient not taking: Reported on 07/08/2019 11/13/18   Chase Picket, MD  mupirocin ointment (BACTROBAN) 2 % Place 1 application into the nose 2 (two) times daily. Apply to the area BID Patient not taking: Reported on 07/08/2019 03/10/19    Lanae Boast, [place]    Allergies    Patient has no known allergies.  Review of Systems   Review of Systems  Constitutional:       Per HPI, otherwise negative  HENT:       Per HPI, otherwise negative  Respiratory:       Per HPI, otherwise negative  Cardiovascular:       Per HPI, otherwise negative  Gastrointestinal: Negative for vomiting.  Endocrine:       Negative aside from HPI  Genitourinary:       Neg aside from HPI   Musculoskeletal:       Per HPI, otherwise negative  Skin: Negative.   Neurological: Positive for weakness. Negative for syncope.    Physical Exam Updated Vital Signs BP (!) 171/154   Pulse 79   Temp 98.1 F (36.7 C)   Resp (!) 22   LMP 02/13/2014   SpO2 (!) 83%   Physical Exam Vitals and nursing note reviewed.  Constitutional:      General: She is not in acute distress.    Appearance: She is well-developed. She is obese.  HENT:     Head: Normocephalic and atraumatic.  Eyes:     Conjunctiva/sclera: Conjunctivae normal.  Cardiovascular:     Rate and Rhythm: Normal rate and regular rhythm.  Pulmonary:     Effort: Pulmonary effort is normal. Tachypnea present. No respiratory distress.     Breath sounds: Normal breath sounds. No stridor.  Abdominal:     General: There is no distension.  Skin:    General: Skin is warm and dry.  Neurological:     Mental Status: She is alert and oriented to person, place, and time.     Cranial Nerves: No cranial nerve deficit.     ED Results / Procedures / Treatments   Labs (all labs ordered are listed, but only abnormal results are displayed) Labs Reviewed  COMPREHENSIVE METABOLIC PANEL - Abnormal; Notable for the following components:      Result Value   Chloride 95 (*)  CO2 34 (*)    Glucose, Bld 123 (*)    Total Protein 8.4 (*)    All other components within normal limits  CBC WITH DIFFERENTIAL/PLATELET - Abnormal; Notable for the following components:   RBC 6.73 (*)    Hemoglobin 15.4 (*)     HCT 50.9 (*)    MCV 75.6 (*)    MCH 22.9 (*)    RDW 20.1 (*)    nRBC 1.6 (*)    All other components within normal limits  RESPIRATORY PANEL BY RT PCR (FLU A&B, COVID)  BRAIN NATRIURETIC PEPTIDE  URINALYSIS, ROUTINE W REFLEX MICROSCOPIC    Radiology DG Chest Port 1 View  Result Date: 07/08/2019 CLINICAL DATA:  Cough and shortness of breath for the past 2 weeks. Fatigue. Smoker. EXAM: PORTABLE CHEST 1 VIEW COMPARISON:  10/12/2018. FINDINGS: Poor inspiration. Stable enlarged cardiac silhouette. Moderate diffuse peribronchial thickening and prominence of the interstitial markings with mild progression. Unremarkable bones. IMPRESSION: 1. Moderate bronchitic changes with progression. 2. Stable cardiomegaly. Electronically Signed   By: Claudie Revering M.D.   On: 07/08/2019 19:36    Procedures Procedures (including critical care time)  Medications Ordered in ED Medications  albuterol (VENTOLIN HFA) 108 (90 Base) MCG/ACT inhaler 2 puff (has no administration in time range)  dexamethasone (DECADRON) injection 10 mg (has no administration in time range)    ED Course  I have reviewed the triage vital signs and the nursing notes.  Pertinent labs & imaging results that were available during my care of the patient were reviewed by me and considered in my medical decision making (see chart for details).    MDM Rules/Calculators/A&P                      10:36 PM Patient smiling when informed that a Covid test is negative. Other labs slightly abnormal, though consistent for her. X-ray demonstrates findings concerning for bronchitis, no pneumonia, no effusions. Here the patient is not currently requiring oxygen. She confirms that she does have access to home oxygen, uses it as needed. She was hypoxic initially on room air, and she is aware of the need for using oxygen as needed at home when she recovers from bronchitis.  She is amenable to initiating steroids, albuterol here, following up with  primary care. Absent other alarming findings, with negative Covid status, patient will be discharged. Final Clinical Impression(s) / ED Diagnoses Final diagnoses:  Acute bronchitis, unspecified organism    Rx / DC Orders ED Discharge Orders         Ordered    predniSONE (DELTASONE) 20 MG tablet  Daily with breakfast     07/08/19 2238           Carmin Muskrat, MD 07/08/19 2238    Carmin Muskrat, MD 07/23/19 1529

## 2019-07-12 ENCOUNTER — Ambulatory Visit (INDEPENDENT_AMBULATORY_CARE_PROVIDER_SITE_OTHER): Payer: Medicaid Other | Admitting: Nurse Practitioner

## 2019-07-12 ENCOUNTER — Other Ambulatory Visit: Payer: Self-pay

## 2019-07-12 ENCOUNTER — Telehealth: Payer: Self-pay

## 2019-07-12 ENCOUNTER — Encounter: Payer: Self-pay | Admitting: Nurse Practitioner

## 2019-07-12 VITALS — BP 150/96 | HR 67 | Temp 99.0°F | Resp 18 | Ht 65.0 in | Wt >= 6400 oz

## 2019-07-12 DIAGNOSIS — M5126 Other intervertebral disc displacement, lumbar region: Secondary | ICD-10-CM

## 2019-07-12 DIAGNOSIS — I10 Essential (primary) hypertension: Secondary | ICD-10-CM | POA: Diagnosis not present

## 2019-07-12 DIAGNOSIS — Z8709 Personal history of other diseases of the respiratory system: Secondary | ICD-10-CM

## 2019-07-12 DIAGNOSIS — E119 Type 2 diabetes mellitus without complications: Secondary | ICD-10-CM | POA: Diagnosis not present

## 2019-07-12 DIAGNOSIS — R0602 Shortness of breath: Secondary | ICD-10-CM

## 2019-07-12 DIAGNOSIS — R05 Cough: Secondary | ICD-10-CM

## 2019-07-12 DIAGNOSIS — M79604 Pain in right leg: Secondary | ICD-10-CM

## 2019-07-12 DIAGNOSIS — Z6841 Body Mass Index (BMI) 40.0 and over, adult: Secondary | ICD-10-CM

## 2019-07-12 DIAGNOSIS — R059 Cough, unspecified: Secondary | ICD-10-CM

## 2019-07-12 DIAGNOSIS — R0981 Nasal congestion: Secondary | ICD-10-CM

## 2019-07-12 LAB — POCT URINALYSIS DIPSTICK
Bilirubin, UA: NEGATIVE
Blood, UA: NEGATIVE
Glucose, UA: NEGATIVE
Ketones, UA: NEGATIVE
Leukocytes, UA: NEGATIVE
Nitrite, UA: NEGATIVE
Protein, UA: NEGATIVE
Spec Grav, UA: 1.01 (ref 1.010–1.025)
Urobilinogen, UA: 2 E.U./dL — AB
pH, UA: 7 (ref 5.0–8.0)

## 2019-07-12 LAB — POCT GLYCOSYLATED HEMOGLOBIN (HGB A1C): Hemoglobin A1C: 6.8 % — AB (ref 4.0–5.6)

## 2019-07-12 MED ORDER — ALBUTEROL SULFATE (2.5 MG/3ML) 0.083% IN NEBU: 2.5000 mg | INHALATION_SOLUTION | Freq: Four times a day (QID) | RESPIRATORY_TRACT | 1 refills | Status: DC | PRN

## 2019-07-12 MED ORDER — AZITHROMYCIN 250 MG PO TABS: ORAL_TABLET | ORAL | 0 refills | Status: DC

## 2019-07-12 MED ORDER — BENZONATATE 100 MG PO CAPS: 100.0000 mg | ORAL_CAPSULE | Freq: Three times a day (TID) | ORAL | 0 refills | Status: AC | PRN

## 2019-07-12 MED ORDER — GABAPENTIN 600 MG PO TABS: 600.0000 mg | ORAL_TABLET | Freq: Three times a day (TID) | ORAL | 0 refills | Status: DC

## 2019-07-12 MED ORDER — PREDNISONE 10 MG PO TABS: 10.0000 mg | ORAL_TABLET | Freq: Every day | ORAL | 0 refills | Status: AC

## 2019-07-12 NOTE — Progress Notes (Signed)
Established Patient Office Visit  Subjective:  Patient ID: Cynthia Becker, female    DOB: 02-28-1971  Age: 49 y.o. MRN: [id number]  CC:  Chief Complaint  Patient presents with  . Follow-up    er follow up for shortness of breath. Needs exam for wheelchair    . Cough    spots of blood in sputumn. requesting a breathing machine. Needs new order oxygen.   Marland Kitchen Hypertension  . Leg Pain    both leg pain. Meds not helping   . Diabetes    HPI  Cynthia Becker presents for follow-up emergency room visit.  She has a history significant for hypertension, diabetes, asthma, depression and anxiety and morbid obesity.  She was recently seen in the emergency room for headache, head congestion, shortness of breath and chest heaviness.  She was diagnosed with acute bronchitis and given prednisone four day supply.  She states that she did not get a prescription for the prednisone.  Her major concern is that the symptoms have not resolved.  She denies fever chills.  She does feel like she is having dry cough because she is just not able to get it up.  She has a history of asthma uses inhalers as needed.  She does use oxygen as needed mostly 2 L at night.  She states she has a recommendation to get a nebulizer.  She feels like this will help her overall.  She is concerned about her blood pressure.  She states that her home blood pressure is 170s over the low 100s.  She feels like this has been going on for several months.  She does see a cardiologist and does have a follow-up appointment with him in 2 weeks.  He did make some changes in her medications she is currently on carvedilol 25 mg twice daily, Isorbid/hydralazine 20/37.5 mg twice daily Lasix 20 mg daily and potassium chloride supplement 10 mEq daily.  She has noticed that she has right leg pain and swelling but states that it has improved.  She does not feel like the gabapentin is helping with her leg pain.  She wonders if this may be changed.  She does feel  like however the gabapentin may cause her to have some dry mouth.  [name] has morbid obesity.  She is working on her weight she has lost approximately 20 pounds.  She does not feel like she is lost this amount of weight.  She is very limited on activity that she can perform at home.  She admits that she only gets up to go to the bathroom.  She does not do any household chores. She has to have assistance with activities of daily living.  She does use a cane and manual wheelchair when available.     Past Medical History:  Diagnosis Date  . Anemia    iv iron treatment dec 2014/ see oncology for this last in dec 2014  . Anxiety   . Arthritis    knee, hands  . Asthma    seasonal related/ albuterol used when uri  . Depression   . H/O dizziness    Hx  . Headache(784.0)    otc med prn  . High blood pressure   . SVD (spontaneous vaginal delivery)    x 3    Past Surgical History:  Procedure Laterality Date  . ABDOMINAL HYSTERECTOMY  03/13/2014   total   . BILATERAL SALPINGECTOMY Bilateral 04/12/2014   Procedure: BILATERAL SALPINGECTOMY;  Surgeon: Alvina Filbert  Roselie Awkward, MD;  Location: [facility];  Service: Gynecology;  Laterality: Bilateral;  . CARPAL TUNNEL RELEASE  08/30/2011   Procedure: CARPAL TUNNEL RELEASE;  Surgeon: Wynonia Sours, MD;  Location: [facility];  Service: Orthopedics;  Laterality: Right;  . CHOLECYSTECTOMY  10/11   lap choli  . GALLBLADDER SURGERY  04/04/2010  . HYSTEROSCOPY N/A 02/08/2014   Procedure: HYSTEROSCOPY;  Surgeon: Woodroe Mode, MD;  Location: [facility];  Service: Gynecology;  Laterality: N/A;  . HYSTEROSCOPY WITH NOVASURE N/A 02/08/2014   Procedure: ATTEMPTED NOVASURE ABLATION;  Surgeon: Woodroe Mode, MD;  Location: [facility];  Service: Gynecology;  Laterality: N/A;  . INTRAUTERINE DEVICE INSERTION     07/19/2013  . SEPTOPLASTY N/A 09/08/2013   Procedure: SEPTOPLASTY;  Surgeon: Ruby Cola, MD;  Location: [facility];  Service: ENT;  Laterality: N/A;  .  TONSILLECTOMY AND ADENOIDECTOMY Bilateral 09/08/2013   Procedure: TONSILLECTOMY ;  Surgeon: Ruby Cola, MD;  Location: [facility];  Service: ENT;  Laterality: Bilateral;  . TUBAL LIGATION    . VAGINAL HYSTERECTOMY N/A 04/12/2014   Procedure: LAPAROSCOPIC ASSISTED VAGINAL HYSTERECTOMY;  Surgeon: Woodroe Mode, MD;  Location: [facility];  Service: Gynecology;  Laterality: N/A;  . WISDOM TOOTH EXTRACTION      Family History  Problem Relation Age of Onset  . Diabetes Mother   . Hypertension Father   . Heart disease Other   . Diabetes Other   . Hypertension Other   . Alcohol abuse Other     Social History   Socioeconomic History  . Marital status: Married    Spouse name: Not on file  . Number of children: 3  . Years of education: Not on file  . Highest education level: Not on file  Occupational History  . Not on file  Tobacco Use  . Smoking status: Current Some Day Smoker    Packs/day: 0.25    Types: Cigarettes    Last attempt to quit: 02/11/2016    Years since quitting: 3.4  . Smokeless tobacco: Never Used  Substance and Sexual Activity  . Alcohol use: No  . Drug use: No  . Sexual activity: Not on file  Other Topics Concern  . Not on file  Social History Narrative  . Not on file   Social Determinants of Health   Financial Resource Strain:   . Difficulty of Paying Living Expenses: Not on file  Food Insecurity:   . Worried About Charity fundraiser in the Last Year: Not on file  . Ran Out of Food in the Last Year: Not on file  Transportation Needs:   . Lack of Transportation (Medical): Not on file  . Lack of Transportation (Non-Medical): Not on file  Physical Activity:   . Days of Exercise per Week: Not on file  . Minutes of Exercise per Session: Not on file  Stress:   . Feeling of Stress : Not on file  Social Connections:   . Frequency of Communication with Friends and Family: Not on file  . Frequency of Social Gatherings with Friends and Family: Not on file  .  Attends Religious Services: Not on file  . Active Member of Clubs or Organizations: Not on file  . Attends Archivist Meetings: Not on file  . Marital Status: Not on file  Intimate Partner Violence:   . Fear of Current or Ex-Partner: Not on file  . Emotionally Abused: Not on file  . Physically Abused: Not on file  .  Sexually Abused: Not on file    Outpatient Medications Prior to Visit  Medication Sig Dispense Refill  . ACCU-CHEK FASTCLIX LANCETS MISC USE TO CHECK BLOOD SUGAR TWICE DAILY  11  . ACCU-CHEK GUIDE test strip USE 1 STRIP TWICE DAILY  11  . carvedilol (COREG) 25 MG tablet Take 1 tablet (25 mg total) by mouth 2 (two) times daily. 120 tablet 2  . Dextromethorphan-guaiFENesin (CORICIDIN HBP CONGESTION/COUGH) 10-200 MG CAPS Take 1 capsule by mouth every 6 (six) hours as needed. 168 capsule 0  . fluticasone (FLOVENT HFA) 110 MCG/ACT inhaler Inhale 1 puff into the lungs 2 (two) times daily. 1 Inhaler 12  . furosemide (LASIX) 20 MG tablet Take 20 mg by mouth daily.    Marland Kitchen ibuprofen (ADVIL,MOTRIN) 800 MG tablet Take 800 mg by mouth every 8 (eight) hours as needed for moderate pain.    . isosorbide-hydrALAZINE (BIDIL) 20-37.5 MG tablet Take 1 tablet by mouth 2 (two) times a day. 120 tablet 2  . lidocaine (LIDODERM) 5 % Place 1 patch onto the skin daily as needed (pain).   5  . omeprazole (PRILOSEC) 20 MG capsule Take 20 mg by mouth daily.    . ondansetron (ZOFRAN) 4 MG tablet Take 4 mg by mouth every 8 (eight) hours as needed for nausea or vomiting.    . Oxycodone HCl 10 MG TABS Take 10 mg by mouth every 6 (six) hours as needed (pain).     . potassium chloride (K-DUR) 10 MEQ tablet Take 1 tablet (10 mEq total) by mouth daily. 30 tablet 2  . QUEtiapine Fumarate (SEROQUEL PO) Take by mouth.    Ginger Organ ER 13.5 MG C12A Take 1 capsule by mouth every 12 (twelve) hours.    . gabapentin (NEURONTIN) 600 MG tablet Take 1 tablet (600 mg total) by mouth 2 (two) times daily as needed. 60  tablet 3  . amitriptyline (ELAVIL) 50 MG tablet Take 1 tablet by mouth as needed for sleep.     . metFORMIN (GLUCOPHAGE-XR) 750 MG 24 hr tablet Take 750 mg by mouth as needed.     . montelukast (SINGULAIR) 10 MG tablet Take 1 tablet (10 mg total) by mouth at bedtime. (Patient not taking: Reported on 07/12/2019) 30 tablet 3  . predniSONE (DELTASONE) 20 MG tablet Take 2 tablets (40 mg total) by mouth daily with breakfast. For the next four days (Patient not taking: Reported on 07/12/2019) 8 tablet 0  . azithromycin (ZITHROMAX) 250 MG tablet Take 500 mg day 1, take 250 mg on days 2-5. (Patient not taking: Reported on 07/08/2019) 6 tablet 0  . cyclobenzaprine (FLEXERIL) 10 MG tablet Take 1 tablet (10 mg total) by mouth at bedtime. (Patient not taking: Reported on 11/25/2018) 30 tablet 0  . fluticasone (FLONASE) 50 MCG/ACT nasal spray Place 1 spray into both nostrils daily. (Patient not taking: Reported on 07/08/2019) 16 g 2  . loratadine (CLARITIN) 10 MG tablet Take 1 tablet (10 mg total) by mouth daily. (Patient not taking: Reported on 07/12/2019) 30 tablet 11  . meclizine (ANTIVERT) 25 MG tablet Take 1 tablet (25 mg total) by mouth 3 (three) times daily as needed for dizziness. (Patient not taking: Reported on 07/08/2019) 30 tablet 0  . mupirocin ointment (BACTROBAN) 2 % Place 1 application into the nose 2 (two) times daily. Apply to the area BID (Patient not taking: Reported on 07/08/2019) 30 g 0   No facility-administered medications prior to visit.    No Known Allergies  ROS Review of Systems  Constitutional: Negative.   HENT: Positive for congestion and sinus pressure.   Respiratory: Positive for cough and shortness of breath.   Cardiovascular: Positive for leg swelling.       Chest heaviness  Gastrointestinal: Negative.   Endocrine: Negative.   Skin: Negative.   Neurological: Positive for weakness, numbness and headaches.  Psychiatric/Behavioral: The patient is nervous/anxious.        Objective:    Physical Exam  Constitutional: She is oriented to person, place, and time. She appears well-developed.  HENT:  Head: Normocephalic.  Cardiovascular: Normal rate and regular rhythm.  Pulmonary/Chest: Effort normal and breath sounds normal.  Musculoskeletal:     Cervical back: Normal range of motion and neck supple.     Comments: Limited range of motion both upper and lower extremities 3 out of 5 upper extremity strength weakness 2-3 out of 5 lower extremity strength weakness Overall deconditioning  Neurological: She is alert and oriented to person, place, and time.  Skin: Skin is warm and dry.  Psychiatric: She has a normal mood and affect. Her behavior is normal. Judgment and thought content normal.  Tearful anxious during visit secondary to want to improve her health.    BP (!) 150/96 (BP Location: Right Arm, Patient Position: Sitting, Cuff Size: Large) Comment: manually  Pulse 67   Temp 99 F (37.2 C) (Oral)   Resp 18   Ht 5\' 5"  (1.651 m)   Wt (!) 439 lb (199.1 kg)   LMP 02/13/2014   SpO2 94%   BMI 73.05 kg/m  Wt Readings from Last 3 Encounters:  07/12/19 (!) 439 lb (199.1 kg)  11/25/18 (!) 465 lb (210.9 kg)  10/15/18 (!) 445 lb (201.9 kg)     Health Maintenance Due  Topic Date Due  . URINE MICROALBUMIN  08/03/2011  . PAP SMEAR-Modifier  08/08/2013    There are no preventive care reminders to display for this patient.  Lab Results  Component Value Date   TSH 1.895 04/07/2013   Lab Results  Component Value Date   WBC 7.6 07/08/2019   HGB 15.4 (H) 07/08/2019   HCT 50.9 (H) 07/08/2019   MCV 75.6 (L) 07/08/2019   PLT 301 07/08/2019   Lab Results  Component Value Date   NA 138 07/08/2019   K 4.8 07/08/2019   CHLORIDE 107 04/07/2013   CO2 34 (H) 07/08/2019   GLUCOSE 123 (H) 07/08/2019   BUN 11 07/08/2019   CREATININE 0.73 07/08/2019   BILITOT 0.9 07/08/2019   ALKPHOS 74 07/08/2019   AST 17 07/08/2019   ALT 20 07/08/2019   PROT 8.4 (H)  07/08/2019   ALBUMIN 3.8 07/08/2019   CALCIUM 9.4 07/08/2019   ANIONGAP 9 07/08/2019   Lab Results  Component Value Date   CHOL 188 08/17/2010   Lab Results  Component Value Date   HDL 43 08/17/2010   Lab Results  Component Value Date   LDLCALC 127 (H) 08/17/2010   Lab Results  Component Value Date   TRIG 91 08/17/2010   Lab Results  Component Value Date   CHOLHDL 4.4 Ratio 08/17/2010   Lab Results  Component Value Date   HGBA1C 6.8 (A) 07/12/2019      Assessment & Plan:   Problem List Items Addressed This Visit      High   Morbid obesity with BMI of 60.0-69.9, adult ([place])    Discussed diet and portion control with the patient encourage patient wants weight continues to  decrease would like to restart physical therapy for strengthening        Medium   DEGENERATIVE DISC DISEASE, LUMBOSACRAL SPINE W/RADICULOPATHY   Relevant Medications   gabapentin (NEURONTIN) 600 MG tablet     Low   High blood pressure   Relevant Orders   Urinalysis Dipstick (Completed)    Other Visit Diagnoses    Type 2 diabetes mellitus without complication, unspecified whether long term insulin use ([place])    -  Primary   Relevant Orders   Urinalysis Dipstick (Completed)   HgB A1c (Completed)   History of asthma       Relevant Orders   For home use only DME Nebulizer machine   Shortness of breath       Relevant Medications   predniSONE (DELTASONE) 10 MG tablet   albuterol (PROVENTIL) (2.5 MG/3ML) 0.083% nebulizer solution   Other Relevant Orders   For home use only DME Nebulizer machine   Head congestion       Cough       Relevant Medications   benzonatate (TESSALON) 100 MG capsule   Right leg pain       Relevant Medications   gabapentin (NEURONTIN) 600 MG tablet      Meds ordered this encounter  Medications  . azithromycin (ZITHROMAX) 250 MG tablet    Sig: 2 tabs one then daily    Dispense:  6 tablet    Refill:  0    Order Specific Question:   Supervising Provider     Answer:   Tresa Garter [id number]  . benzonatate (TESSALON) 100 MG capsule    Sig: Take 1 capsule (100 mg total) by mouth 3 (three) times daily as needed for up to 10 days for cough. Never suck or chew on a benzonatate capsule.    Dispense:  30 capsule    Refill:  0    Do not place medication on "Automatic Refill". Please provide patient with medication counseling and recommendations.    Order Specific Question:   Supervising Provider    Answer:   Tresa Garter [id number]  . predniSONE (DELTASONE) 10 MG tablet    Sig: Take 1 tablet (10 mg total) by mouth daily with breakfast for 5 days.    Dispense:  5 tablet    Refill:  0    Order Specific Question:   Supervising Provider    Answer:   Tresa Garter [id number]  . albuterol (PROVENTIL) (2.5 MG/3ML) 0.083% nebulizer solution    Sig: Take 3 mLs (2.5 mg total) by nebulization every 6 (six) hours as needed for wheezing or shortness of breath.    Dispense:  150 mL    Refill:  1    Order Specific Question:   Supervising Provider    Answer:   Tresa Garter [id number]  . gabapentin (NEURONTIN) 600 MG tablet    Sig: Take 1 tablet (600 mg total) by mouth 3 (three) times daily.    Dispense:  180 tablet    Refill:  0    Order Specific Question:   Supervising Provider    Answer:   Tresa Garter [id number]    Follow-up: Return in about 4 weeks (around 08/09/2019).    [name], NP

## 2019-07-12 NOTE — Assessment & Plan Note (Signed)
Discussed diet and portion control with the patient encourage patient wants weight continues to decrease would like to restart physical therapy for strengthening

## 2019-07-12 NOTE — Patient Instructions (Signed)
How to Use a Nebulizer, Adult  A nebulizer is a device that turns liquid medicine into a mist (vapor) that you can breathe in (inhale). You may need to use a nebulizer if you have a breathing illness, such as asthma or pneumonia. There are different kinds of nebulizers. With some, you breathe in through a mouthpiece. With others, a mask fits over your nose and mouth. Risks and complications Using a nebulizer that does not fit right or is not cleaned right can lead to the following complications:  Infection.  Eye irritation.  Delivery of too much medicine or not enough medicine.  Mouth irritation. How to prepare before using a nebulizer Take these steps before using your nebulizer: 1. Check your medicine. Make sure it has not expired and is not damaged in any way. 2. Wash your hands with soap and water. 3. Put all of the parts of your nebulizer on a sturdy, flat surface. Make sure all of the tubing is connected. 4. Measure the liquid medicine according to instructions from your health care provider. Pour the liquid into the part of the nebulizer that holds the medicine (reservoir). 5. Attach the mouthpiece or mask. 6. Test the nebulizer by turning it on to make sure that a spray comes out. Then, turn it off. How to use a nebulizer     1. Sit down and relax. 2. If your nebulizer has a mask, put it over your nose and mouth. It should fit somewhat snugly, with no gaps around the nose or cheeks where medicine could escape. If you use a mouthpiece, put it in your mouth. Press your lips firmly around the mouthpiece. 3. Turn on the nebulizer. 4. Breathe out (exhale). 5. Some nebulizers have a finger valve. If yours does, cover up the air hole so the air gets to the nebulizer. 6. Once the medicine begins to mist out, take slow, deep breaths. If there is a finger valve, release it at the end of your breath. 7. Continue taking slow, deep breaths until the medicine in the nebulizer is gone and no  vapor appears. Be sure to stop the machine at any time if you start coughing or if the medicine foams or bubbles. How to clean a nebulizer The nebulizer and all of its parts must be kept very clean. If the nebulizer and its parts are not cleaned properly, bacteria can grow inside of them. If you inhale the bacteria, you can get sick. Follow the manufacturer's instructions for cleaning your nebulizer. For most nebulizers, you should follow these guidelines:  Clean the mouthpiece or mask and the medicine cup by: ? Rinsing them after each use. Use sterile or distilled water. ? Washing them 1-2 times a week using soap and warm water.  Do not wash the tubing.  After you rinse or wash them, place the parts on a clean towel and let them dry completely. After they dry, reconnect the pieces and turn the nebulizer on without any medicine in it. Doing this will blow air through the equipment to help dry it out.  Store the nebulizer in a clean and dust-free place.  Check the filter at least one time every week. Replace it if it looks dirty. Contact a health care provider if:  You continue to have trouble breathing.  You have trouble using the nebulizer.  Your breathing gets worse during a nebulizer treatment.  Your nebulizer stops working, foams, or does not create a mist after you add medicine and turn it  on. Summary  A nebulizer is a device that turns liquid medicine into a mist (vapor) that you can breathe in (inhale).  Measure the liquid medicine according to instructions from your health care provider. Pour the liquid into the part of the nebulizer that holds the medicine (reservoir).  Once the medicine begins to mist out, take slow, deep breaths.  Rinse or wash the mouthpiece and the medicine cup after each use, and allow them to dry completely. This information is not intended to replace advice given to you by your health care provider. Make sure you discuss any questions you have with  your health care provider. Document Revised: 12/23/2017 Document Reviewed: 12/16/2015 Elsevier Patient Education  [place] Oxygen Use, Adult When a medical condition keeps you from getting enough oxygen, your health care provider may instruct you to take extra oxygen at home. Your health care provider will let you know:  When to take oxygen.  For how long to take oxygen.  How quickly oxygen should be delivered (flow rate), in liters per minute (LPM or L/M). Home oxygen can be given through:  A mask.  A nasal cannula. This is a device or tube that goes in the nostrils.  A transtracheal catheter. This is a small, flexible tube placed in the trachea.  A tracheostomy. This is a surgically made opening in the trachea. These devices are connected with tubing to an oxygen source, such as:  A tank. Tanks hold oxygen in gas form. They must be replaced when the oxygen is used up.  A liquid oxygen device. This holds oxygen in liquid form. It must be replaced when the oxygen is used up.  An oxygen concentrator machine. This filters oxygen in the room. It uses electricity, so you must have a backup cylinder of oxygen in case the power goes out. Supplies needed: To use oxygen, you will need:  A mask, nasal cannula, transtracheal catheter, or tracheostomy.  An oxygen tank, a liquid oxygen device, or an oxygen concentrator.  The tape that your health care provider recommends (optional). If you use a transtracheal catheter and your prescribed flow rate is 1 LPM or greater, you will also need a humidifier. Risks and complications  Fire. This can happen if the oxygen is exposed to a heat source, flame, or spark.  Injury to skin. This can happen if liquid oxygen touches your skin.  Organ damage. This can happen if you get too little oxygen. How to use oxygen Your health care provider or a representative from your [place] will show you how to use your oxygen  device. Follow her or his instructions. The instructions may look something like this: 1. Wash your hands. 2. If you use an oxygen concentrator, make sure it is plugged in. 3. Place one end of the tube into the port on the tank, device, or machine. 4. Place the mask over your nose and mouth. Or, place the nasal cannula and secure it with tape if instructed. If you use a tracheostomy or transtracheal catheter, connect it to the oxygen source as directed. 5. Make sure the liter-flow setting on the machine is at the level prescribed by your health care provider. 6. Turn on the machine or adjust the knob on the tank or device to the correct liter-flow setting. 7. When you are done, turn off and unplug the machine, or turn the knob to OFF. How to clean and care for the oxygen supplies Nasal cannula  Clean it  with a warm, wet cloth daily or as needed.  Wash it with a liquid soap once a week.  Rinse it thoroughly once or twice a week.  Replace it every 2-4 weeks.  If you have an infection, such as a cold or pneumonia, change the cannula when you get better. Mask  Replace it every 2-4 weeks.  If you have an infection, such as a cold or pneumonia, change the mask when you get better. Humidifier bottle  Wash the bottle between each refill: ? Wash it with soap and warm water. ? Rinse it thoroughly. ? Disinfect it and its top. ? Air-dry it.  Make sure it is dry before you refill it. Oxygen concentrator  Clean the air filter at least twice a week according to directions from your home medical equipment and service company.  Wipe down the cabinet every day. To do this: ? Unplug the unit. ? Wipe down the cabinet with a damp cloth. ? Dry the cabinet. Other equipment  Change any extra tubing every 1-3 months.  Follow instructions from your health care provider about taking care of any other equipment. Safety tips Fire safety tips   Keep your oxygen and oxygen supplies at least 5 ft  away from sources of heat, flames, and sparks at all times.  Do not allow smoking near your oxygen. Put up "no smoking" signs in your home. Avoid smoking areas when in public.  Do not use materials that can burn (are flammable) while you use oxygen.  When you go to a restaurant with portable oxygen, ask to be seated in the nonsmoking section.  Keep a Data processing manager close by. Let your fire department know that you have oxygen in your home.  Test your home smoke detectors regularly. Traveling  Secure your oxygen tank in the vehicle so that it does not move around. Follow instructions from your medical device company about how to safely secure your tank.  Make sure you have enough oxygen for the amount of time you will be away from home.  If you are planning air travel, contact the airline to find out if they allow the use of an approved portable oxygen concentrator. You may also need documents from your health care provider and medical device company before you travel. General safety tips  If you use an oxygen cylinder, make sure it is in a stand or secured to an object that will not move (fixed object).  If you use liquid oxygen, make sure its container is kept upright.  If you use an oxygen concentrator: ? [employer] movement psychotherapist company. Make sure you are given priority service in the event that your power goes out. ? Avoid using extension cords, if possible. Follow these instructions at home:  Use oxygen only as told by your health care provider.  Do not use alcohol or other drugs that make you relax (sedating drugs) unless instructed. They can slow down your breathing rate and make it hard to get in enough oxygen.  Know how and when to order a refill of oxygen.  Always keep a spare tank of oxygen. Plan ahead for holidays when you may not be able to get a prescription filled.  Use water-based lubricants on your lips or nostrils. Do not use oil-based products like petroleum  jelly.  To prevent skin irritation on your cheeks or behind your ears, tuck some gauze under the tubing. Contact a health care provider if:  You get headaches often.  You have shortness  of breath.  You have a lasting cough.  You have anxiety.  You are sleepy all the time.  You develop an illness that affects your breathing.  You cannot exercise at your regular level.  You are restless.  You have difficult or irregular breathing, and it is getting worse.  You have a fever.  You have persistent redness under your nose. Get help right away if:  You are confused.  You have blue lips or fingernails.  You are struggling to breathe. Summary  Your health care provider or a representative from your [place] will show you how to use your oxygen device. Follow her or his instructions.  If you use an oxygen concentrator, make sure it is plugged in.  Make sure the liter-flow setting on the machine is at the level prescribed by your health care provider.  Keep your oxygen and oxygen supplies at least 5 ft away from sources of heat, flames, and sparks at all times. This information is not intended to replace advice given to you by your health care provider. Make sure you discuss any questions you have with your health care provider. Document Revised: 11/27/2017 Document Reviewed: 01/02/2016 Elsevier Patient Education  [place].

## 2019-07-12 NOTE — Telephone Encounter (Signed)
Faxed order to [facility] for nebulizer machine today 07/12/19 @1 :56am. Thanks!

## 2019-07-15 ENCOUNTER — Ambulatory Visit: Payer: Medicaid Other

## 2019-07-15 ENCOUNTER — Other Ambulatory Visit: Payer: Self-pay

## 2019-07-15 DIAGNOSIS — R0602 Shortness of breath: Secondary | ICD-10-CM

## 2019-07-15 NOTE — Progress Notes (Signed)
Cynthia Becker is here today for a walking pulse ox test. At rest pulse ox was 97%, with ambulation pulse ox dropped to 93%. Patient had increased shortness of breath with ambulation.

## 2019-07-26 ENCOUNTER — Ambulatory Visit: Payer: Medicaid Other | Admitting: Cardiology

## 2019-07-26 NOTE — Progress Notes (Signed)
No show

## 2019-07-28 ENCOUNTER — Telehealth: Payer: Self-pay | Admitting: Family Medicine

## 2019-07-28 NOTE — Telephone Encounter (Signed)
Can an RX be written for oxygen and a face mask for pt? Please advise.

## 2019-07-28 NOTE — Telephone Encounter (Signed)
Rx for oxygen has been faxed to [facility], today 07/28/19 @10 :36am. Thanks!

## 2019-07-29 ENCOUNTER — Ambulatory Visit: Payer: Medicaid Other | Admitting: Cardiology

## 2019-08-09 ENCOUNTER — Other Ambulatory Visit: Payer: Self-pay

## 2019-08-09 ENCOUNTER — Ambulatory Visit (INDEPENDENT_AMBULATORY_CARE_PROVIDER_SITE_OTHER): Payer: Medicaid Other | Admitting: Cardiology

## 2019-08-09 ENCOUNTER — Encounter: Payer: Self-pay | Admitting: Cardiology

## 2019-08-09 VITALS — BP 167/95 | HR 78 | Temp 97.3°F | Ht 65.0 in | Wt >= 6400 oz

## 2019-08-09 DIAGNOSIS — I10 Essential (primary) hypertension: Secondary | ICD-10-CM

## 2019-08-09 DIAGNOSIS — R0602 Shortness of breath: Secondary | ICD-10-CM | POA: Diagnosis not present

## 2019-08-09 MED ORDER — SPIRONOLACTONE 50 MG PO TABS: 50.0000 mg | ORAL_TABLET | Freq: Every day | ORAL | 3 refills | Status: DC

## 2019-08-09 MED ORDER — BIDIL 20-37.5 MG PO TABS: 1.0000 | ORAL_TABLET | Freq: Three times a day (TID) | ORAL | 2 refills | Status: DC

## 2019-08-09 NOTE — Progress Notes (Signed)
Subjective:   Cynthia Becker, female    DOB: 1971-03-23, 49 y.o.   MRN: [id number]   Chief complaint:  Hypertension  49 year old African-American female with hypertension, hyperlipidemia, type 2 DM, morbid obesity, hyperlipidemia.  Patient continues to have uncontrolled hypertension. She has been unable to lose weight. Her physical activity is extremely limited (came on wheelchair to her appt today) due to back pain and exertional dyspnea. She reports episodes where her husband thought she had stopping breathing during sleep. She is concerned about this.    Current Outpatient Medications on File Prior to Visit  Medication Sig Dispense Refill  . ACCU-CHEK FASTCLIX LANCETS MISC USE TO CHECK BLOOD SUGAR TWICE DAILY  11  . ACCU-CHEK GUIDE test strip USE 1 STRIP TWICE DAILY  11  . albuterol (PROVENTIL) (2.5 MG/3ML) 0.083% nebulizer solution Take 3 mLs (2.5 mg total) by nebulization every 6 (six) hours as needed for wheezing or shortness of breath. 150 mL 1  . carvedilol (COREG) 25 MG tablet Take 1 tablet (25 mg total) by mouth 2 (two) times daily. 120 tablet 2  . fluticasone (FLOVENT HFA) 110 MCG/ACT inhaler Inhale 1 puff into the lungs 2 (two) times daily. 1 Inhaler 12  . gabapentin (NEURONTIN) 600 MG tablet Take 1 tablet (600 mg total) by mouth 3 (three) times daily. 180 tablet 0  . ibuprofen (ADVIL,MOTRIN) 800 MG tablet Take 800 mg by mouth every 8 (eight) hours as needed for moderate pain.    Marland Kitchen lidocaine (LIDODERM) 5 % Place 1 patch onto the skin daily as needed (pain).   5  . metFORMIN (GLUCOPHAGE-XR) 750 MG 24 hr tablet Take 750 mg by mouth as needed.     Marland Kitchen omeprazole (PRILOSEC) 20 MG capsule Take 20 mg by mouth daily.    . ondansetron (ZOFRAN) 4 MG tablet Take 4 mg by mouth every 8 (eight) hours as needed for nausea or vomiting.    . Oxycodone HCl 10 MG TABS Take 10 mg by mouth every 6 (six) hours as needed (pain).     Ginger Organ ER 13.5 MG C12A Take 1 capsule by mouth every 12  (twelve) hours.    Marland Kitchen QUEtiapine Fumarate (SEROQUEL PO) Take by mouth.     No current facility-administered medications on file prior to visit.    Cardiovascular studies:  EKG 04/16/2018: Sinus rhythm 100 bpm. First degree AV block. Left atrial enlargement. Poor R wave progression.  Echocardiogram 04/21/2017: Left ventricle cavity is normal in size. Mild concentric remodeling of the left ventricle. Mild decrease in global wall motion. Visual EF is 45-50%. Doppler evidence of grade I (impaired) diastolic dysfunction, normal LAP.  Left atrial cavity is mildly dilated.  Recent labs: 07/08/2019: Glucose 123, BUN/Cr 11/0.73. EGFR >60. Na/K 138/4.8. Rest of the CMP normal H/H 15/50. MCV 75. Platelets 301 HbA1C 6.8%    Review of Systems  Cardiovascular: Positive for dyspnea on exertion and leg swelling. Negative for chest pain, palpitations and syncope.  Respiratory: Positive for shortness of breath.   Gastrointestinal: Negative for nausea and vomiting.  All other systems reviewed and are negative.        Vitals:   08/09/19 1632 08/09/19 1634  BP: (!) 158/100 (!) 167/95  Pulse: 74 78  Temp: (!) 97.3 F (36.3 C)   SpO2: 94%       Objective:    Physical Exam  Constitutional: She appears well-developed. No distress.  Morbidly obese  Neck: No JVD present.  Cardiovascular: Normal rate, regular rhythm  and intact distal pulses.  No murmur heard. Pulmonary/Chest: Effort normal.  Musculoskeletal:        General: No edema.  Nursing note and vitals reviewed.         Assessment & Recommendations:   49 year old African-American female with hypertension, hyperlipidemia, type 2 DM, morbid obesity, hyperlipidemia  Hypertension: Uncontrolled stage 2 hypertension. Continue Coreg to 25 mg bid. Increase Bidil 20-37.5 mg to 1 tab tid Start spironolactone 50 mg daily. Stop lasix. Check BMP In 1 week. Given her recent exertional dyspnea, will check echocardiogram. I am also  concerned [id number] causing OSA/OHS. Will refer to pulmonology.  Morbid obesity: This remains her primary comorbidity, likely responsible for most of her medical issues.  Strongly recommend considering surgical weight loss, in addition to diet and lifestyle changes. She is motivated and wants to lose weight.  Type 2 DM: Follow up with PCP.   Nigel Mormon, MD [facility] Pager: [phone] Office: [phone] If no answer Cell [phone]

## 2019-08-12 ENCOUNTER — Ambulatory Visit (INDEPENDENT_AMBULATORY_CARE_PROVIDER_SITE_OTHER): Payer: Medicaid Other | Admitting: Nurse Practitioner

## 2019-08-12 ENCOUNTER — Other Ambulatory Visit: Payer: Self-pay

## 2019-08-12 DIAGNOSIS — D508 Other iron deficiency anemias: Secondary | ICD-10-CM | POA: Diagnosis not present

## 2019-08-12 DIAGNOSIS — I10 Essential (primary) hypertension: Secondary | ICD-10-CM | POA: Diagnosis not present

## 2019-08-12 DIAGNOSIS — M5126 Other intervertebral disc displacement, lumbar region: Secondary | ICD-10-CM

## 2019-08-12 DIAGNOSIS — E119 Type 2 diabetes mellitus without complications: Secondary | ICD-10-CM

## 2019-08-12 MED ORDER — CARVEDILOL 25 MG PO TABS: 25.0000 mg | ORAL_TABLET | Freq: Two times a day (BID) | ORAL | 0 refills | Status: DC

## 2019-08-12 NOTE — Patient Instructions (Signed)

## 2019-08-12 NOTE — Progress Notes (Addendum)
Virtual Visit via Telephone Note  I connected with Cynthia Becker on 08/13/19 at  3:20 PM EST by telephone and verified that I am speaking with the correct person using two identifiers.   I discussed the limitations, risks, security and privacy concerns of performing an evaluation and management service by telephone and the availability of in person appointments. I also discussed with the patient that there may be a patient responsible charge related to this service. The patient expressed understanding and agreed to proceed.   History of Present Illness:  Cynthia Becker  has a past medical history of Anemia, Anxiety, Arthritis, Asthma, CN (constipation) (09/14/2014), DEGENERATIVE DISC DISEASE, LUMBOSACRAL SPINE W/RADICULOPATHY (11/01/2009), Depression, H/O dizziness, Headache(784.0), High blood pressure, HYPERLIPIDEMIA (02/13/2010), INSOMNIA (08/24/2008), Morbid obesity ([place]) (05/24/2008). Cynthia Becker was recently seen by cardiology for hypertension.  Cynthia Becker admits that there were some changes in her medications.  The furosemide was discontinued and Cynthia Becker was started on spironolactone 50 mg daily.  Cynthia Becker feels like it has been effective in getting rid of the fluid in her legs.  Cynthia Becker admits that the carvedilol was also discontinued.  Cynthia Becker said this was not available when Cynthia Becker went to the pharmacy.  However the cardiology note indicates that Cynthia Becker is to continue the carvedilol 25 mg twice a day.  He increased the BiDil 20/37.5 mg 3 times daily.  Cynthia Becker states that her fasting blood glucose ranges from 120s to 170s.  Cynthia Becker is currently on Metformin 750 mg daily.  Cynthia Becker admits that Cynthia Becker has taken my advice and is working on portion control because Cynthia Becker is unable to exercise.  Cynthia Becker is trying to do the low-carb diet.  Cynthia Becker feels like this will be the best to help her with the weight loss.  Cynthia Becker feels like Cynthia Becker gained 150 pounds over the last 5 years after having her hysterectomy.  Cynthia Becker admits that the surgery lasted 5 times the predetermined length.  Since then  Cynthia Becker has not been able to walk straight.  Cynthia Becker did discuss gastric bypass with cardiology.  Cynthia Becker is fearful of having any surgery because of her previous experience.  Cynthia Becker admits that Cynthia Becker will continue to try on her own but Cynthia Becker will look into surgical options.  Cynthia Becker does have a referral to pulmonology for further evaluation of her asthma and shortness of breath.  Cynthia Becker is currently on albuterol nebulizer which is effective.  Cynthia Becker also uses 2 L oxygen while Cynthia Becker sleeps.  Cynthia Becker feels this is very effective.  Cynthia Becker states that Cynthia Becker was told in the past that if Cynthia Becker remains on Qvar this would help her and Cynthia Becker would not have to use the albuterol as often.  Cynthia Becker has a history of degenerative disc disease.  Cynthia Becker is currently being followed by pain management.  Cynthia Becker does take gabapentin 600 mg up to 3 times daily.  Cynthia Becker continues to try not to use this more than twice daily.  Denies headache, dizziness, visual changes, chest pain, nausea, vomiting, constipation or diarrhea..        Observations/Objective:   Assessment and Plan: Assessment  Primary Diagnosis & Pertinent Problem List: The primary encounter diagnosis was Type 2 diabetes mellitus without complication, unspecified whether long term insulin use (Cynthia Becker). Diagnoses of Essential hypertension, Morbid obesity ([place]), Other iron deficiency anemia, and DEGENERATIVE DISC DISEASE, LUMBOSACRAL SPINE W/RADICULOPATHY were also pertinent to this visit.  Visit Diagnosis: 1. Type 2 diabetes mellitus without complication, unspecified whether long term insulin use ([place])   2. Essential hypertension   3. Morbid  obesity ([facility])   4. Other iron deficiency anemia   5. DEGENERATIVE DISC DISEASE, LUMBOSACRAL SPINE W/RADICULOPATHY     Follow Up Instructions:  Plan of Care  Pharmacotherapy (Medications Ordered): Meds ordered this encounter  Medications  . carvedilol (COREG) 25 MG tablet    Sig: Take 1 tablet (25 mg total) by mouth 2 (two) times daily.    Dispense:  180 tablet      Refill:  0    Order Specific Question:   Supervising Provider    Answer:   Tresa Garter [id number]   New Prescriptions   No medications on file   Medications administered today: Cynthia Becker had no medications administered during this visit. Lab-work, procedure(s), and/or referral(s): No orders of the defined types were placed in this encounter.  Imaging and/or referral(s): None  Provider-requested follow-up: Return in about 3 months (around 11/09/2019) for follow up.  Future Appointments  Date Time Provider [place]  08/18/2019  1:30 PM [facility]ECHO/VAS 1 [facility]IMG None  09/06/2019  4:15 PM Patwardhan, Reynold Bowen, MD [facility]V None   Patient instructions provided during this appointment: Patient Instructions  Diabetes Mellitus and Nutrition, Adult When you have diabetes (diabetes mellitus), it is very important to have healthy eating habits because your blood sugar (glucose) levels are greatly affected by what you eat and drink. Eating healthy foods in the appropriate amounts, at about the same times every day, can help you:  Control your blood glucose.  Lower your risk of heart disease.  Improve your blood pressure.  Reach or maintain a healthy weight. Every person with diabetes is different, and each person has different needs for a meal plan. Your health care provider may recommend that you work with a diet and nutrition specialist (dietitian) to make a meal plan that is best for you. Your meal plan may vary depending on factors such as:  The calories you need.  The medicines you take.  Your weight.  Your blood glucose, blood pressure, and cholesterol levels.  Your activity level.  Other health conditions you have, such as heart or kidney disease. How do carbohydrates affect me? Carbohydrates, also called carbs, affect your blood glucose level more than any other type of food. Eating carbs naturally raises the amount of glucose in your blood. Carb counting  is a method for keeping track of how many carbs you eat. Counting carbs is important to keep your blood glucose at a healthy level, especially if you use insulin or take certain oral diabetes medicines. It is important to know how many carbs you can safely have in each meal. This is different for every person. Your dietitian can help you calculate how many carbs you should have at each meal and for each snack. Foods that contain carbs include:  Bread, cereal, rice, pasta, and crackers.  Potatoes and corn.  Peas, beans, and lentils.  Milk and yogurt.  Fruit and juice.  Desserts, such as cakes, cookies, ice cream, and candy. How does alcohol affect me? Alcohol can cause a sudden decrease in blood glucose (hypoglycemia), especially if you use insulin or take certain oral diabetes medicines. Hypoglycemia can be a life-threatening condition. Symptoms of hypoglycemia (sleepiness, dizziness, and confusion) are similar to symptoms of having too much alcohol. If your health care provider says that alcohol is safe for you, follow these guidelines:  Limit alcohol intake to no more than 1 drink per day for nonpregnant women and 2 drinks per day for men. One drink equals 12 oz  of beer, 5 oz of wine, or 1 oz of hard liquor.  Do not drink on an empty stomach.  Keep yourself hydrated with water, diet soda, or unsweetened iced tea.  Keep in mind that regular soda, juice, and other mixers may contain a lot of sugar and must be counted as carbs. What are tips for following this plan?  Reading food labels  Start by checking the serving size on the "Nutrition Facts" label of packaged foods and drinks. The amount of calories, carbs, fats, and other nutrients listed on the label is based on one serving of the item. Many items contain more than one serving per package.  Check the total grams (g) of carbs in one serving. You can calculate the number of servings of carbs in one serving by dividing the total  carbs by 15. For example, if a food has 30 g of total carbs, it would be equal to 2 servings of carbs.  Check the number of grams (g) of saturated and trans fats in one serving. Choose foods that have low or no amount of these fats.  Check the number of milligrams (mg) of salt (sodium) in one serving. Most people should limit total sodium intake to less than 2,300 mg per day.  Always check the nutrition information of foods labeled as "low-fat" or "nonfat". These foods may be higher in added sugar or refined carbs and should be avoided.  Talk to your dietitian to identify your daily goals for nutrients listed on the label. Shopping  Avoid buying canned, premade, or processed foods. These foods tend to be high in fat, sodium, and added sugar.  Shop around the outside edge of the grocery store. This includes fresh fruits and vegetables, bulk grains, fresh meats, and fresh dairy. Cooking  Use low-heat cooking methods, such as baking, instead of high-heat cooking methods like deep frying.  Cook using healthy oils, such as olive, canola, or sunflower oil.  Avoid cooking with butter, cream, or high-fat meats. Meal planning  Eat meals and snacks regularly, preferably at the same times every day. Avoid going long periods of time without eating.  Eat foods high in fiber, such as fresh fruits, vegetables, beans, and whole grains. Talk to your dietitian about how many servings of carbs you can eat at each meal.  Eat 4-6 ounces (oz) of lean protein each day, such as lean meat, chicken, fish, eggs, or tofu. One oz of lean protein is equal to: ? 1 oz of meat, chicken, or fish. ? 1 egg. ?  cup of tofu.  Eat some foods each day that contain healthy fats, such as avocado, nuts, seeds, and fish. Lifestyle  Check your blood glucose regularly.  Exercise regularly as told by your health care provider. This may include: ? 150 minutes of moderate-intensity or vigorous-intensity exercise each week.  This could be brisk walking, biking, or water aerobics. ? Stretching and doing strength exercises, such as yoga or weightlifting, at least 2 times a week.  Take medicines as told by your health care provider.  Do not use any products that contain nicotine or tobacco, such as cigarettes and e-cigarettes. If you need help quitting, ask your health care provider.  Work with a Social worker or diabetes educator to identify strategies to manage stress and any emotional and social challenges. Questions to ask a health care provider  Do I need to meet with a diabetes educator?  Do I need to meet with a dietitian?  What number can  I call if I have questions?  When are the best times to check my blood glucose? Where to find more information:  American Diabetes Association: diabetes.org  Academy of Nutrition and Dietetics: www.eatright.[employer] of Diabetes and Digestive and Kidney Diseases (NIH): [employer] Summary  A healthy meal plan will help you control your blood glucose and maintain a healthy lifestyle.  Working with a diet and nutrition specialist (dietitian) can help you make a meal plan that is best for you.  Keep in mind that carbohydrates (carbs) and alcohol have immediate effects on your blood glucose levels. It is important to count carbs and to use alcohol carefully. This information is not intended to replace advice given to you by your health care provider. Make sure you discuss any questions you have with your health care provider. Document Revised: 05/23/2017 Document Reviewed: 07/15/2016 Elsevier Patient Education  [place].    I discussed the assessment and treatment plan with the patient. The patient was provided an opportunity to ask questions and all were answered. The patient agreed with the plan and demonstrated an understanding of the instructions.   The patient was advised to call back or seek an in-person evaluation if the symptoms  worsen or if the condition fails to improve as anticipated.  I provided 18 minutes of non-face-to-face time during this encounter.   [name], NP

## 2019-08-13 ENCOUNTER — Encounter: Payer: Self-pay | Admitting: Nurse Practitioner

## 2019-08-18 ENCOUNTER — Other Ambulatory Visit: Payer: Self-pay

## 2019-08-18 ENCOUNTER — Ambulatory Visit: Payer: Medicaid Other

## 2019-08-18 DIAGNOSIS — I10 Essential (primary) hypertension: Secondary | ICD-10-CM

## 2019-08-18 LAB — BASIC METABOLIC PANEL
BUN/Creatinine Ratio: 12 (ref 9–23)
BUN: 10 mg/dL (ref 6–24)
CO2: 28 mmol/L (ref 20–29)
Calcium: 9 mg/dL (ref 8.7–10.2)
Chloride: 100 mmol/L (ref 96–106)
Creatinine, Ser: 0.86 mg/dL (ref 0.57–1.00)
GFR calc Af Amer: 92 mL/min/{1.73_m2} (ref >59)
GFR calc non Af Amer: 80 mL/min/{1.73_m2} (ref >59)
Glucose: 116 mg/dL — ABNORMAL HIGH (ref 65–99)
Potassium: 4.7 mmol/L (ref 3.5–5.2)
Sodium: 141 mmol/L (ref 134–144)

## 2019-09-06 ENCOUNTER — Encounter: Payer: Self-pay | Admitting: Cardiology

## 2019-09-06 ENCOUNTER — Other Ambulatory Visit: Payer: Self-pay

## 2019-09-06 ENCOUNTER — Ambulatory Visit: Payer: Medicaid Other | Admitting: Cardiology

## 2019-09-06 VITALS — BP 162/108 | HR 78 | Temp 95.3°F | Resp 16 | Ht 65.0 in | Wt >= 6400 oz

## 2019-09-06 DIAGNOSIS — I10 Essential (primary) hypertension: Secondary | ICD-10-CM

## 2019-09-06 NOTE — Progress Notes (Signed)
Subjective:   Cynthia Becker, female    DOB: Feb 16, 1971, 49 y.o.   MRN: [id number]   Chief complaint:  Hypertension  49 year old African-American female with hypertension, hyperlipidemia, type 2 DM, morbid obesity, hyperlipidemia.  Blood pressure remains elevated on office check, she reports that blood pressure is improved on her home checks.  She has made changes to her diet, including reducing carb intake, juice intake etc.  She has lost 10 pounds since her last visit.  She has not heard from pulmonologist regarding referral yet.  Current Outpatient Medications on File Prior to Visit  Medication Sig Dispense Refill  . ACCU-CHEK FASTCLIX LANCETS MISC USE TO CHECK BLOOD SUGAR TWICE DAILY  11  . ACCU-CHEK GUIDE test strip USE 1 STRIP TWICE DAILY  11  . albuterol (PROVENTIL) (2.5 MG/3ML) 0.083% nebulizer solution Take 3 mLs (2.5 mg total) by nebulization every 6 (six) hours as needed for wheezing or shortness of breath. 150 mL 1  . carvedilol (COREG) 25 MG tablet Take 1 tablet (25 mg total) by mouth 2 (two) times daily. 180 tablet 0  . fluticasone (FLOVENT HFA) 110 MCG/ACT inhaler Inhale 1 puff into the lungs 2 (two) times daily. 1 Inhaler 12  . gabapentin (NEURONTIN) 600 MG tablet Take 1 tablet (600 mg total) by mouth 3 (three) times daily. 180 tablet 0  . ibuprofen (ADVIL,MOTRIN) 800 MG tablet Take 800 mg by mouth every 8 (eight) hours as needed for moderate pain.    . isosorbide-hydrALAZINE (BIDIL) 20-37.5 MG tablet Take 1 tablet by mouth 3 (three) times daily. 120 tablet 2  . omeprazole (PRILOSEC) 20 MG capsule Take 20 mg by mouth daily.    . ondansetron (ZOFRAN) 4 MG tablet Take 4 mg by mouth every 8 (eight) hours as needed for nausea or vomiting.    . Oxycodone HCl 10 MG TABS Take 10 mg by mouth every 6 (six) hours as needed (pain).     . QUEtiapine Fumarate (SEROQUEL PO) Take by mouth.    . spironolactone (ALDACTONE) 50 MG tablet Take 1 tablet (50 mg total) by mouth daily. 30  tablet 3  . XTAMPZA ER 13.5 MG C12A Take 1 capsule by mouth every 12 (twelve) hours.    . lidocaine (LIDODERM) 5 % Place 1 patch onto the skin daily as needed (pain).   5   No current facility-administered medications on file prior to visit.    Cardiovascular studies:  EKG 09/06/2019: Sinus rhythm 79 bpm. Normal EKG.   Echocardiogram 08/18/2019:  Study Quality: Technically Difficult  Normal LV systolic function with visual EF 50-55%. Left ventricle cavity  is normal in size. Normal global wall motion. Doppler evidence of grade II  diastolic dysfunction, elevated LAP. No obvious regional wall motion  abnormalities. Moderate left ventricular hypertrophy.  Left atrial cavity is moderately dilated.  No significant valvular abnormalities.  IVC is dilated with a respiratory response of >50%.  Prior study dated 03/2017: Mild concentric remodeling of the left  ventricle. Mild decrease in global wall motion. Visual EF is 45-50%. Grade  I diastolic dysfunction, normal LAP. Left atrial cavity is mildly dilated.  Recent labs: 08/17/2019: Glucose 116, BUN/Cr 10/0.86. EGFR 92. Na/K 141/4.7.   07/08/2019: Glucose 123, BUN/Cr 11/0.73. EGFR >60. Na/K 138/4.8. Rest of the CMP normal H/H 15/50. MCV 75. Platelets 301 HbA1C 6.8%    Review of Systems  Cardiovascular: Positive for dyspnea on exertion and leg swelling. Negative for chest pain, palpitations and syncope.  Respiratory: Positive for shortness  of breath.   Gastrointestinal: Negative for nausea and vomiting.  All other systems reviewed and are negative.        Vitals:   09/06/19 1630 09/06/19 1640  BP: (!) 188/118 (!) 162/108  Pulse: 74 78  Resp: 16   Temp: (!) 95.3 F (35.2 C)   SpO2: 98%       Objective:    Physical Exam  Constitutional: She appears well-developed. No distress.  Morbidly obese  Neck: No JVD present.  Cardiovascular: Normal rate, regular rhythm and intact distal pulses.  No murmur heard.  Pulmonary/Chest: Effort normal.  Musculoskeletal:        General: No edema.  Nursing note and vitals reviewed.         Assessment & Recommendations:   49 year old African-American female with hypertension, hyperlipidemia, type 2 DM, morbid obesity, hyperlipidemia  Hypertension: Variable blood pressures.  Arrange for chronic care management for hypertension.  No changes made to her current regimen that includes the following: Carvedilol 12 5 mg twice daily, BiDil 20-37.5 mg to 1 tab tid,spironolactone 50 mg daily.  I am also concerned JG:[id number] causing OSA/OHS. Will refer to pulmonology.  Morbid obesity: This remains her primary comorbidity, likely responsible for most of her medical issues.  Strongly recommend considering surgical weight loss, in addition to diet and lifestyle changes. She is motivated and wants to lose weight.  Type 2 DM: Follow up with PCP.  F/u in 3 months.   Nigel Mormon, MD [facility] Pager: [phone] Office: [phone] If no answer Cell [phone]

## 2019-10-15 ENCOUNTER — Ambulatory Visit: Payer: Medicaid Other | Admitting: Nurse Practitioner

## 2019-10-18 ENCOUNTER — Ambulatory Visit (INDEPENDENT_AMBULATORY_CARE_PROVIDER_SITE_OTHER): Payer: Medicaid Other | Admitting: Nurse Practitioner

## 2019-10-18 ENCOUNTER — Other Ambulatory Visit: Payer: Self-pay

## 2019-10-18 VITALS — BP 177/108 | HR 72 | Ht 65.5 in | Wt >= 6400 oz

## 2019-10-18 DIAGNOSIS — E119 Type 2 diabetes mellitus without complications: Secondary | ICD-10-CM

## 2019-10-18 DIAGNOSIS — I10 Essential (primary) hypertension: Secondary | ICD-10-CM

## 2019-10-18 DIAGNOSIS — R0600 Dyspnea, unspecified: Secondary | ICD-10-CM | POA: Diagnosis not present

## 2019-10-18 DIAGNOSIS — G894 Chronic pain syndrome: Secondary | ICD-10-CM

## 2019-10-18 DIAGNOSIS — F17209 Nicotine dependence, unspecified, with unspecified nicotine-induced disorders: Secondary | ICD-10-CM

## 2019-10-18 DIAGNOSIS — R601 Generalized edema: Secondary | ICD-10-CM | POA: Diagnosis not present

## 2019-10-18 LAB — POCT GLYCOSYLATED HEMOGLOBIN (HGB A1C)
HbA1c POC (<> result, manual entry): 6.7 % (ref 4.0–5.6)
HbA1c, POC (controlled diabetic range): 6.7 % (ref 0.0–7.0)
HbA1c, POC (prediabetic range): 6.7 % — AB (ref 5.7–6.4)
Hemoglobin A1C: 6.7 % — AB (ref 4.0–5.6)

## 2019-10-18 MED ORDER — AMOXICILLIN-POT CLAVULANATE 875-125 MG PO TABS: 1.0000 | ORAL_TABLET | Freq: Two times a day (BID) | ORAL | 0 refills | Status: DC

## 2019-10-18 NOTE — Patient Instructions (Signed)
Edema  Edema is when you have too much fluid in your body or under your skin. Edema may make your legs, feet, and ankles swell up. Swelling is also common in looser tissues, like around your eyes. This is a common condition. It gets more common as you get older. There are many possible causes of edema. Eating too much salt (sodium) and being on your feet or sitting for a long time can cause edema in your legs, feet, and ankles. Hot weather may make edema worse. Edema is usually painless. Your skin may look swollen or shiny. Follow these instructions at home:  Keep the swollen body part raised (elevated) above the level of your heart when you are sitting or lying down.  Do not sit still or stand for a long time.  Do not wear tight clothes. Do not wear garters on your upper legs.  Exercise your legs. This can help the swelling go down.  Wear elastic bandages or support stockings as told by your doctor.  Eat a low-salt (low-sodium) diet to reduce fluid as told by your doctor.  Depending on the cause of your swelling, you may need to limit how much fluid you drink (fluid restriction).  Take over-the-counter and prescription medicines only as told by your doctor. Contact a doctor if:  Treatment is not working.  You have heart, liver, or kidney disease and have symptoms of edema.  You have sudden and unexplained weight gain. Get help right away if:  You have shortness of breath or chest pain.  You cannot breathe when you lie down.  You have pain, redness, or warmth in the swollen areas.  You have heart, liver, or kidney disease and get edema all of a sudden.  You have a fever and your symptoms get worse all of a sudden. Summary  Edema is when you have too much fluid in your body or under your skin.  Edema may make your legs, feet, and ankles swell up. Swelling is also common in looser tissues, like around your eyes.  Raise (elevate) the swollen body part above the level of your  heart when you are sitting or lying down.  Follow your doctor's instructions about diet and how much fluid you can drink (fluid restriction). This information is not intended to replace advice given to you by your health care provider. Make sure you discuss any questions you have with your health care provider. Document Revised: 06/13/2017 Document Reviewed: 06/28/2016 Elsevier Patient Education  [place]. Sleep Apnea Sleep apnea affects breathing during sleep. It causes breathing to stop for a short time or to become shallow. It can also increase the risk of:  Heart attack.  Stroke.  Being very overweight (obese).  Diabetes.  Heart failure.  Irregular heartbeat. The goal of treatment is to help you breathe normally again. What are the causes? There are three kinds of sleep apnea:  Obstructive sleep apnea. This is caused by a blocked or collapsed airway.  Central sleep apnea. This happens when the brain does not send the right signals to the muscles that control breathing.  Mixed sleep apnea. This is a combination of obstructive and central sleep apnea. The most common cause of this condition is a collapsed or blocked airway. This can happen if:  Your throat muscles are too relaxed.  Your tongue and tonsils are too large.  You are overweight.  Your airway is too small. What increases the risk?  Being overweight.  Smoking.  Having a  small airway.  Being older.  Being female.  Drinking alcohol.  Taking medicines to calm yourself (sedatives or tranquilizers).  Having family members with the condition. What are the signs or symptoms?  Trouble staying asleep.  Being sleepy or tired during the day.  Getting angry a lot.  Loud snoring.  Headaches in the morning.  Not being able to focus your mind (concentrate).  Forgetting things.  Less interest in sex.  Mood swings.  Personality changes.  Feelings of sadness (depression).  Waking up a  lot during the night to pee (urinate).  Dry mouth.  Sore throat. How is this diagnosed?  Your medical history.  A physical exam.  A test that is done when you are sleeping (sleep study). The test is most often done in a sleep lab but may also be done at home. How is this treated?   Sleeping on your side.  Using a medicine to get rid of mucus in your nose (decongestant).  Avoiding the use of alcohol, medicines to help you relax, or certain pain medicines (narcotics).  Losing weight, if needed.  Changing your diet.  Not smoking.  Using a machine to open your airway while you sleep, such as: ? An oral appliance. This is a mouthpiece that shifts your lower jaw forward. ? A CPAP device. This device blows air through a mask when you breathe out (exhale). ? An EPAP device. This has valves that you put in each nostril. ? A BPAP device. This device blows air through a mask when you breathe in (inhale) and breathe out.  Having surgery if other treatments do not work. It is important to get treatment for sleep apnea. Without treatment, it can lead to:  High blood pressure.  Coronary artery disease.  In men, not being able to have an erection (impotence).  Reduced thinking ability. Follow these instructions at home: Lifestyle  Make changes that your doctor recommends.  Eat a healthy diet.  Lose weight if needed.  Avoid alcohol, medicines to help you relax, and some pain medicines.  Do not use any products that contain nicotine or tobacco, such as cigarettes, e-cigarettes, and chewing tobacco. If you need help quitting, ask your doctor. General instructions  Take over-the-counter and prescription medicines only as told by your doctor.  If you were given a machine to use while you sleep, use it only as told by your doctor.  If you are having surgery, make sure to tell your doctor you have sleep apnea. You may need to bring your device with you.  Keep all follow-up  visits as told by your doctor. This is important. Contact a doctor if:  The machine that you were given to use during sleep bothers you or does not seem to be working.  You do not get better.  You get worse. Get help right away if:  Your chest hurts.  You have trouble breathing in enough air.  You have an uncomfortable feeling in your back, arms, or stomach.  You have trouble talking.  One side of your body feels weak.  A part of your face is hanging down. These symptoms may be an emergency. Do not wait to see if the symptoms will go away. Get medical help right away. Call your local emergency services (911 in the [place] Do not drive yourself to the hospital. Summary  This condition affects breathing during sleep.  The most common cause is a collapsed or blocked airway.  The goal of treatment is  to help you breathe normally while you sleep. This information is not intended to replace advice given to you by your health care provider. Make sure you discuss any questions you have with your health care provider. Document Revised: 03/27/2018 Document Reviewed: 02/03/2018 Elsevier Patient Education  [place]  Dental caries are spots of decay (cavities) in teeth. They are in the outer layer of your tooth (enamel). Treat them as soon as you can. If they are not treated, they can spread decay and lead to painful infection. Follow these instructions at home: General instructions  Take good care of your mouth and teeth. This keeps them healthy. ? Brush your teeth 2 times a day. Use toothpaste with fluoride in it. ? Floss your teeth once a day.  If your dentist prescribed an antibiotic medicine to treat an infection, take it as told. Do not stop taking the antibiotic even if your condition gets better.  Keep all follow-up visits as told by your dentist. This is important. This includes all cleanings. Preventing dental caries   Brush your teeth every  morning and night. Use fluoride toothpaste.  Get regular dental cleanings.  If you are at risk of dental caries. ? Wash your mouth with prescription mouthwash (chlorhexidine). ? Put topical fluoride on your teeth.  Drink water with fluoride in it.  Drink water instead of sugary drinks.  Eat healthy meals and snacks. Contact a doctor if:  You have symptoms of tooth decay. Summary  Dental caries are spots of decay (cavities) in teeth. They are in the outer layer of your tooth.  Take an antibiotic to treat an infection, if told by your dentist. Do not stop taking the antibiotic even if your condition gets better.  Regular dental cleanings and brushing can help prevent dental caries. This information is not intended to replace advice given to you by your health care provider. Make sure you discuss any questions you have with your health care provider. Document Revised: 05/23/2017 Document Reviewed: 02/25/2016 Elsevier Patient Education  2020 [employer].

## 2019-10-18 NOTE — Progress Notes (Signed)
Established Patient Office Visit  Subjective:  Patient ID: Cynthia Becker, female    DOB: 1970/09/20  Age: 49 y.o. MRN: [id number]  CC:  Chief Complaint  Patient presents with  . Follow-up    having alot swelling , discuss get referral to for pain mangenment,    HPI [name] presents for follow up. She  has a past medical history of Anemia, Anxiety, Arthritis, Asthma, CN (constipation) (09/14/2014), DEGENERATIVE DISC DISEASE, LUMBOSACRAL SPINE W/RADICULOPATHY (11/01/2009), Depression, H/O dizziness, Headache(784.0), High blood pressure, HYPERLIPIDEMIA (02/13/2010), INSOMNIA (08/24/2008), Morbid obesity ([place]) (05/24/2008), and SVD (spontaneous vaginal delivery).    Patient who presents with complaint of toothache. Onset of symptoms was gradual starting several days ago. Patient describes pain as aching. Pain severity at onset was moderate. The pain does radiate. Patient denies jaw swelling or fever >101. Pain is aggravated by use. Pain is alleviated by NSAIDS. The patient denies other complaints. Patient has not sought treatment by another care provider for this problem. Care prior to arrival consisted of NSAID, with minimal relief.  She has lost about 10 pounds. She admits that this is in part due to her inability to eat.   Edema Patient complains of edema in generalized. The edema has been mild. Onset of symptoms was several days ago, and patient reports symptoms have stabilized since that time. The edema is present intermittently. The patient states the problem is long-standing. The swelling has been aggravated by nothing. The swelling has been relieved by nothing. Associated factors include: shortness of breath. Cardiac risk factors include diabetes mellitus, obesity (BMI >= 30 kg/m2), sedentary lifestyle and smoking/ tobacco exposure.  Sleep Apnea Patient presents with possible obstructive sleep apnea. Patent has a several months history of symptoms of daytime fatigue and hypertension.  Patient generally gets a few hours of sleep per night, and states they generally have nightime awakenings. Snoring of moderate severity is present. Apneic episodes are present. Nasal obstruction is not present.  She sleeps on seven pillows at night.   Past Medical History:  Diagnosis Date  . Anemia    iv iron treatment dec 2014/ see oncology for this last in dec 2014  . Anxiety   . Arthritis    knee, hands  . Asthma    seasonal related/ albuterol used when uri  . CN (constipation) 09/14/2014  . [place] DISEASE, LUMBOSACRAL SPINE W/RADICULOPATHY 11/01/2009   Qualifier: Diagnosis of  By: Jorene Minors, Scott    . Depression   . H/O dizziness    Hx  . Headache(784.0)    otc med prn  . High blood pressure   . HYPERLIPIDEMIA 02/13/2010   Qualifier: Diagnosis of  By: Jorene Minors, Scott    . INSOMNIA 08/24/2008   Qualifier: Diagnosis of  By: Radene Ou MD, [place]    . Morbid obesity ([place]) 05/24/2008   Qualifier: Diagnosis of  By: Radene Ou MD, [place]    . SVD (spontaneous vaginal delivery)    x 3    Past Surgical History:  Procedure Laterality Date  . ABDOMINAL HYSTERECTOMY  03/13/2014   total   . BILATERAL SALPINGECTOMY Bilateral 04/12/2014   Procedure: BILATERAL SALPINGECTOMY;  Surgeon: Woodroe Mode, MD;  Location: [facility];  Service: Gynecology;  Laterality: Bilateral;  . CARPAL TUNNEL RELEASE  08/30/2011   Procedure: CARPAL TUNNEL RELEASE;  Surgeon: Wynonia Sours, MD;  Location: [facility];  Service: Orthopedics;  Laterality: Right;  . CHOLECYSTECTOMY  10/11   lap choli  . GALLBLADDER SURGERY  04/04/2010  . HYSTEROSCOPY N/A 02/08/2014   Procedure: HYSTEROSCOPY;  Surgeon: Woodroe Mode, MD;  Location: [facility];  Service: Gynecology;  Laterality: N/A;  . HYSTEROSCOPY WITH NOVASURE N/A 02/08/2014   Procedure: ATTEMPTED NOVASURE ABLATION;  Surgeon: Woodroe Mode, MD;  Location: [facility];  Service: Gynecology;  Laterality: N/A;  . INTRAUTERINE DEVICE INSERTION      07/19/2013  . SEPTOPLASTY N/A 09/08/2013   Procedure: SEPTOPLASTY;  Surgeon: Ruby Cola, MD;  Location: [facility];  Service: ENT;  Laterality: N/A;  . TONSILLECTOMY AND ADENOIDECTOMY Bilateral 09/08/2013   Procedure: TONSILLECTOMY ;  Surgeon: Ruby Cola, MD;  Location: [facility];  Service: ENT;  Laterality: Bilateral;  . TUBAL LIGATION    . VAGINAL HYSTERECTOMY N/A 04/12/2014   Procedure: LAPAROSCOPIC ASSISTED VAGINAL HYSTERECTOMY;  Surgeon: Woodroe Mode, MD;  Location: [facility];  Service: Gynecology;  Laterality: N/A;  . WISDOM TOOTH EXTRACTION      Family History  Problem Relation Age of Onset  . Diabetes Mother   . Hypertension Father   . Heart disease Other   . Diabetes Other   . Hypertension Other   . Alcohol abuse Other   . Hypertension Brother   . Asthma Brother   . Hypertension Brother   . Asthma Brother     Social History   Socioeconomic History  . Marital status: Married    Spouse name: Not on file  . Number of children: 3  . Years of education: Not on file  . Highest education level: Not on file  Occupational History  . Not on file  Tobacco Use  . Smoking status: Current Some Day Smoker    Packs/day: 0.25    Types: Cigarettes    Last attempt to quit: 02/11/2016    Years since quitting: 3.6  . Smokeless tobacco: Never Used  Substance and Sexual Activity  . Alcohol use: No  . Drug use: No  . Sexual activity: Not on file  Other Topics Concern  . Not on file  Social History Narrative  . Not on file   Social Determinants of Health   Financial Resource Strain:   . Difficulty of Paying Living Expenses:   Food Insecurity:   . Worried About Charity fundraiser in the Last Year:   . Arboriculturist in the Last Year:   Transportation Needs:   . Film/video editor (Medical):   Marland Kitchen Lack of Transportation (Non-Medical):   Physical Activity:   . Days of Exercise per Week:   . Minutes of Exercise per Session:   Stress:   . Feeling of Stress :   Social  Connections:   . Frequency of Communication with Friends and Family:   . Frequency of Social Gatherings with Friends and Family:   . Attends Religious Services:   . Active Member of Clubs or Organizations:   . Attends Archivist Meetings:   Marland Kitchen Marital Status:   Intimate Partner Violence:   . Fear of Current or Ex-Partner:   . Emotionally Abused:   Marland Kitchen Physically Abused:   . Sexually Abused:     Outpatient Medications Prior to Visit  Medication Sig Dispense Refill  . ACCU-CHEK FASTCLIX LANCETS MISC USE TO CHECK BLOOD SUGAR TWICE DAILY  11  . ACCU-CHEK GUIDE test strip USE 1 STRIP TWICE DAILY  11  . albuterol (PROVENTIL) (2.5 MG/3ML) 0.083% nebulizer solution Take 3 mLs (2.5 mg total) by nebulization every 6 (six) hours as needed for wheezing  or shortness of breath. 150 mL 1  . carvedilol (COREG) 25 MG tablet Take 1 tablet (25 mg total) by mouth 2 (two) times daily. 180 tablet 0  . fluticasone (FLOVENT HFA) 110 MCG/ACT inhaler Inhale 1 puff into the lungs 2 (two) times daily. 1 Inhaler 12  . gabapentin (NEURONTIN) 600 MG tablet Take 1 tablet (600 mg total) by mouth 3 (three) times daily. 180 tablet 0  . ibuprofen (ADVIL,MOTRIN) 800 MG tablet Take 800 mg by mouth every 8 (eight) hours as needed for moderate pain.    . isosorbide-hydrALAZINE (BIDIL) 20-37.5 MG tablet Take 1 tablet by mouth 3 (three) times daily. 120 tablet 2  . lidocaine (LIDODERM) 5 % Place 1 patch onto the skin daily as needed (pain).   5  . omeprazole (PRILOSEC) 20 MG capsule Take 20 mg by mouth daily.    . ondansetron (ZOFRAN) 4 MG tablet Take 4 mg by mouth every 8 (eight) hours as needed for nausea or vomiting.    . Oxycodone HCl 10 MG TABS Take 10 mg by mouth every 6 (six) hours as needed (pain).     . QUEtiapine Fumarate (SEROQUEL PO) Take by mouth.    . spironolactone (ALDACTONE) 50 MG tablet Take 1 tablet (50 mg total) by mouth daily. 30 tablet 3  . XTAMPZA ER 13.5 MG C12A Take 1 capsule by mouth every 12  (twelve) hours.     No facility-administered medications prior to visit.    No Known Allergies  ROS Review of Systems  All other systems reviewed and are negative.     Objective:    Physical Exam  Constitutional: She is oriented to person, place, and time.  Obese  Eyes: Pupils are equal, round, and reactive to light.  Cardiovascular: Normal rate, regular rhythm and normal heart sounds.  Pulmonary/Chest: Effort normal.  Diminished breath sounds  Musculoskeletal:        General: Normal range of motion.     Cervical back: Normal range of motion.     Comments: Wheelchair in use for long distances but able to ambulate with a cane   Neurological: She is alert and oriented to person, place, and time.  Skin: Skin is warm and dry.  Scaling of bilateral lower extremities   Psychiatric: She has a normal mood and affect. Her behavior is normal. Judgment and thought content normal.    BP (!) 177/108 (BP Location: Left Arm)   Pulse 72   Ht 5' 5.5" (1.664 m)   Wt (!) 441 lb (200 kg)   LMP 02/13/2014   SpO2 100%   BMI 72.27 kg/m  Wt Readings from Last 3 Encounters:  10/18/19 (!) 441 lb (200 kg)  09/06/19 (!) 441 lb (200 kg)  08/09/19 (!) 451 lb (204.6 kg)     Health Maintenance Due  Topic Date Due  . COVID-19 Vaccine (1) Never done  . URINE MICROALBUMIN  08/03/2011    There are no preventive care reminders to display for this patient.  Lab Results  Component Value Date   TSH 1.895 04/07/2013   Lab Results  Component Value Date   WBC 8.1 10/18/2019   HGB 17.3 (H) 10/18/2019   HCT 52.7 (H) 10/18/2019   MCV 74 (L) 10/18/2019   PLT 290 10/18/2019   Lab Results  Component Value Date   NA 141 08/17/2019   K 4.7 08/17/2019   CHLORIDE 107 04/07/2013   CO2 28 08/17/2019   GLUCOSE 116 (H) 08/17/2019   BUN  10 08/17/2019   CREATININE 0.86 08/17/2019   BILITOT 0.9 07/08/2019   ALKPHOS 74 07/08/2019   AST 17 07/08/2019   ALT 20 07/08/2019   PROT 8.4 (H) 07/08/2019    ALBUMIN 3.8 07/08/2019   CALCIUM 9.0 08/17/2019   ANIONGAP 9 07/08/2019   Lab Results  Component Value Date   CHOL 188 08/17/2010   Lab Results  Component Value Date   HDL 43 08/17/2010   Lab Results  Component Value Date   LDLCALC 127 (H) 08/17/2010   Lab Results  Component Value Date   TRIG 91 08/17/2010   Lab Results  Component Value Date   CHOLHDL 4.4 Ratio 08/17/2010   Lab Results  Component Value Date   HGBA1C 6.7 (A) 10/18/2019   HGBA1C 6.7 10/18/2019   HGBA1C 6.7 (A) 10/18/2019   HGBA1C 6.7 10/18/2019      Assessment & Plan:   Problem List Items Addressed This Visit    None    Visit Diagnoses    Type 2 diabetes mellitus without complication, unspecified whether long term insulin use (HCC)    -  Primary   Relevant Orders   POCT Urinalysis Dipstick   HgB A1c (Completed)   Generalized edema       Relevant Orders   Pro b natriuretic peptide (BNP) (Completed)   CBC with Differential/Platelet (Completed)   Dyspnea, unspecified type       Relevant Orders   DG Chest 2 View   PSG Sleep Study   Chronic pain syndrome       Relevant Orders   Ambulatory referral to Pain Clinic      Meds ordered this encounter  Medications  . amoxicillin-clavulanate (AUGMENTIN) 875-125 MG tablet    Sig: Take 1 tablet by mouth 2 (two) times daily.    Dispense:  20 tablet    Refill:  0    Order Specific Question:   Supervising Provider    Answer:   Tresa Garter [id number]    Follow-up: Return in about 3 months (around 01/17/2020) for follow up.    [name], NP

## 2019-10-19 LAB — CBC WITH DIFFERENTIAL/PLATELET
Basophils Absolute: 0 10*3/uL (ref 0.0–0.2)
Basos: 1 %
EOS (ABSOLUTE): 0.2 10*3/uL (ref 0.0–0.4)
Eos: 3 %
Hematocrit: 52.7 % — ABNORMAL HIGH (ref 34.0–46.6)
Hemoglobin: 17.3 g/dL — ABNORMAL HIGH (ref 11.1–15.9)
Immature Grans (Abs): 0 10*3/uL (ref 0.0–0.1)
Immature Granulocytes: 0 %
Lymphocytes Absolute: 1.9 10*3/uL (ref 0.7–3.1)
Lymphs: 23 %
MCH: 24.3 pg — ABNORMAL LOW (ref 26.6–33.0)
MCHC: 32.8 g/dL (ref 31.5–35.7)
MCV: 74 fL — ABNORMAL LOW (ref 79–97)
Monocytes Absolute: 0.6 10*3/uL (ref 0.1–0.9)
Monocytes: 7 %
Neutrophils Absolute: 5.4 10*3/uL (ref 1.4–7.0)
Neutrophils: 66 %
Platelets: 290 10*3/uL (ref 150–450)
RBC: 7.12 x10E6/uL (ref 3.77–5.28)
RDW: 20.5 % — ABNORMAL HIGH (ref 11.7–15.4)
WBC: 8.1 10*3/uL (ref 3.4–10.8)

## 2019-10-19 LAB — PRO B NATRIURETIC PEPTIDE: NT-Pro BNP: 12 pg/mL (ref 0–249)

## 2019-10-19 MED ORDER — AMLODIPINE BESYLATE 10 MG PO TABS: 10.0000 mg | ORAL_TABLET | Freq: Every day | ORAL | 3 refills | Status: DC

## 2019-10-20 ENCOUNTER — Telehealth: Payer: Self-pay | Admitting: Pharmacist

## 2019-10-20 ENCOUNTER — Telehealth: Payer: Self-pay | Admitting: Nurse Practitioner

## 2019-10-20 ENCOUNTER — Other Ambulatory Visit: Payer: Self-pay | Admitting: Nurse Practitioner

## 2019-10-20 ENCOUNTER — Telehealth: Payer: Self-pay

## 2019-10-20 DIAGNOSIS — D508 Other iron deficiency anemias: Secondary | ICD-10-CM

## 2019-10-20 MED ORDER — HYDROCHLOROTHIAZIDE 25 MG PO TABS: 25.0000 mg | ORAL_TABLET | Freq: Every day | ORAL | 0 refills | Status: DC

## 2019-10-20 NOTE — Telephone Encounter (Signed)
Called and notified patient of her labs.

## 2019-10-20 NOTE — Telephone Encounter (Signed)
BP readings reviewed. BP stable and near goal. BP trending down. Called to discuss with pt. Pt is also having toothache and was started on augmentin.  PCP started pt on amlodipine for OV BP reading of 177/108. Concerns of possible worsening edema with amlodipine. Left a message for PCP to consider switching amlodipine to a thiazide diuretic to also address the elevated BP and edema symptoms.   PCP, Dionisio David, NP, called back. Discussed pt with PCP who stated that when she saw the pt in clinic, she couldn't ascertain any physical s/sx of edema. Okay with d/c amlodipine and starting HCTZ for short term. NP also stated that augmentin was started due to pt complaining of tooth pain and and pt requesting abx.  Pt states that she currently doesnt have a f/u dentist appt but will try to reach out and schedule one. BP today was 91/65 and pt reported feeling dizzy, lightheaded, and weak. Encouraged pt to get some rest and to continue staying hydrated. Asked pt to recheck her BP later today and continue checking it regularly moving forward. Reviewed with pt to hold the amlodipine and hold the new HCTZ to ensure that her BP doesnt continue to trend down. Med list reviewed and updated. Pt recently started on carbamazepine by pt's psychiatrist. Started a week or so ago. Pt also complaining of leg numbness that worsened over the past couple of days. Takes a while in the morning for pt to get sensation in her leg and concerned for possible fall risk. Will continue to monitor BP readings closely and follow up as needed

## 2019-10-20 NOTE — Telephone Encounter (Signed)
Called and spoke with receptionist concerning patient.  Blood pressure was elevated at her previous visit as it was 1 month ago while visiting cardiology.  I have sent start the patient on amlodipine 10 mg daily if anyone has any additional questions please let me know thanks

## 2019-10-29 ENCOUNTER — Other Ambulatory Visit: Payer: Self-pay

## 2019-11-04 ENCOUNTER — Encounter (HOSPITAL_COMMUNITY): Payer: Self-pay | Admitting: Emergency Medicine

## 2019-11-04 ENCOUNTER — Emergency Department (HOSPITAL_COMMUNITY)
Admission: EM | Admit: 2019-11-04 | Discharge: 2019-11-04 | Disposition: A | Discharge status: Home / Self Care | Payer: Medicaid Other | Attending: Emergency Medicine | Admitting: Emergency Medicine

## 2019-11-04 ENCOUNTER — Other Ambulatory Visit: Payer: Self-pay

## 2019-11-04 DIAGNOSIS — Z5321 Procedure and treatment not carried out due to patient leaving prior to being seen by health care provider: Secondary | ICD-10-CM | POA: Diagnosis not present

## 2019-11-04 DIAGNOSIS — R110 Nausea: Secondary | ICD-10-CM | POA: Insufficient documentation

## 2019-11-04 DIAGNOSIS — R42 Dizziness and giddiness: Secondary | ICD-10-CM | POA: Diagnosis not present

## 2019-11-04 DIAGNOSIS — I959 Hypotension, unspecified: Secondary | ICD-10-CM | POA: Diagnosis not present

## 2019-11-04 DIAGNOSIS — R5383 Other fatigue: Secondary | ICD-10-CM | POA: Diagnosis not present

## 2019-11-04 NOTE — ED Triage Notes (Signed)
Pt reports dizzy, shaky, fatigue, no energy, nausea, low BP (105/59). Reports took her last HCTZ yesterday. Pt reports started new depression med a month ago and doesn't like the way she feels.

## 2019-11-05 ENCOUNTER — Institutional Professional Consult (permissible substitution): Payer: Medicaid Other | Admitting: Pulmonary Disease

## 2019-11-16 ENCOUNTER — Telehealth: Payer: Self-pay | Admitting: Pharmacist

## 2019-11-16 NOTE — Telephone Encounter (Signed)
Called pt for PCM f/u. Pt reports to have recent complains of lightheadedness and dizziness. Recent ER visit on 11/04/19 with complains of dizzy, shaky, fatigue, no energy, nausea, low BP (105/59). Pt left the ER room on her own after waiting for a few hours. Pt reports that the symptoms have been worse following her carbamazepine dose. Pt stopped carbamazepine on her own and reports her symptoms have somewhat improved since. Still complains of low energy, fatigue, and general malaise. Pt currently not on any antihypertensive medication regimen. Pt has held all her antihypertensive medications on her own. Home BP readings have been stable and near goal. Pt prefers to continue monitoring for now. Asked pt to increase BP check frequency to BID and whenever she is having episodes of lightheadedness or dizziness. Pt denies any recent episodes of syncope or falls. Pt open to restarting some of her antihypertensives if BP starts to trend up. Denies any complains of CP, severe headaches. Complains of mild SOB that resolves with albuterol nebulizer. Will continue to monitor and follow up as needed.

## 2019-12-02 ENCOUNTER — Ambulatory Visit: Payer: Medicaid Other | Admitting: Critical Care Medicine

## 2019-12-02 ENCOUNTER — Other Ambulatory Visit: Payer: Self-pay

## 2019-12-02 ENCOUNTER — Encounter: Payer: Self-pay | Admitting: Critical Care Medicine

## 2019-12-02 DIAGNOSIS — J309 Allergic rhinitis, unspecified: Secondary | ICD-10-CM

## 2019-12-02 DIAGNOSIS — K219 Gastro-esophageal reflux disease without esophagitis: Secondary | ICD-10-CM | POA: Diagnosis not present

## 2019-12-02 DIAGNOSIS — G4733 Obstructive sleep apnea (adult) (pediatric): Secondary | ICD-10-CM

## 2019-12-02 DIAGNOSIS — Z720 Tobacco use: Secondary | ICD-10-CM

## 2019-12-02 DIAGNOSIS — I5032 Chronic diastolic (congestive) heart failure: Secondary | ICD-10-CM

## 2019-12-02 MED ORDER — OMEPRAZOLE 20 MG PO CPDR: 20.0000 mg | DELAYED_RELEASE_CAPSULE | Freq: Every day | ORAL | 11 refills | Status: DC

## 2019-12-02 MED ORDER — FLUTICASONE PROPIONATE 50 MCG/ACT NA SUSP
2.0000 | Freq: Every day | NASAL | 11 refills | Status: DC
Start: 2019-12-02 — End: 2020-04-29

## 2019-12-02 MED ORDER — CETIRIZINE HCL 10 MG PO TABS: 10.0000 mg | ORAL_TABLET | Freq: Every day | ORAL | 11 refills | Status: DC

## 2019-12-02 MED ORDER — FLUTICASONE-SALMETEROL 250-50 MCG/DOSE IN AEPB: 1.0000 | INHALATION_SPRAY | Freq: Two times a day (BID) | RESPIRATORY_TRACT | 11 refills | Status: DC

## 2019-12-02 MED ORDER — MONTELUKAST SODIUM 10 MG PO TABS: 10.0000 mg | ORAL_TABLET | Freq: Every day | ORAL | 11 refills | Status: DC

## 2019-12-02 NOTE — Progress Notes (Signed)
Synopsis: Referred in June 2021 for SOB by Nigel Mormon, MD.  Subjective:   PATIENT ID: Cynthia Becker GENDER: female DOB: 1971/03/28, MRN: [id number]  Chief Complaint  Patient presents with  . Consult    Patient has shortness of breath all the time and worse with exertion. Patient is more stationary but walks to bathroom, kitchen, etc around house. Productive cough with clear/white. Patient uses rescue inhaler and nebs.     Cynthia Becker is a 49 y/o woman referred for evaluation of SOB and chronic cough.  She is wheezing with significant exertion.  She presents today with her husband. She has had chronic shortness of breath and cough for many years, with progressive symptoms in the past 2 months.  She has a history of asthma and is currently prescribed Flovent, which she has been taking only once daily.  She uses albuterol every day.  She has chronic allergies but did not have benefit from Claritin and stopped taking it.  She takes Flonase intermittently, but notices this causes worse postnasal drip and productive cough.  Her main complaint is feeling like she has fluid in her chest that she has to cough up.  Her sputum is clear.  She sleeps propped totally upright, but still wakes up feeling like she cannot "spit stuff out" fast enough.  She has no rhinorrhea.  She has had hoarseness and ear fullness, but no sneezing, itchy nose, watery eyes.  She saw an allergist in the past and had allergy testing; she has year-round allergies.  Her family history is notable for COPD and lung cancer in her maternal grandmother.  She smokes 3 to 4 cigarettes/day due to stress; she began smoking 6 years ago but has always smoked less than 1 pack/day.  She has chronic reflux, but is no longer taking a PPI.  She gets heartburn about 1 day/week.  She wakes up with water brash in the morning.  She drinks some caffeine, but frequently drinks soda.  No mints.  She eats late in the evening.  She had a complicated  surgery several years ago requiring subsequent back surgery, and since then she has gained 200 pounds in the past 5 years.  Her cardiologist is concerned she has developed obesity hypoventilation syndrome and sleep apnea.  She has chronic edema, daytime fatigue, and hypertension.  She has frequent nighttime awakenings.     Past Medical History:  Diagnosis Date  . Anemia    iv iron treatment dec 2014/ see oncology for this last in dec 2014  . Anxiety   . Arthritis    knee, hands  . Asthma    seasonal related/ albuterol used when uri  . CN (constipation) 09/14/2014  . [place] DISEASE, LUMBOSACRAL SPINE W/RADICULOPATHY 11/01/2009   Qualifier: Diagnosis of  By: Jorene Minors, [place]    . Depression   . Diabetes ([place])   . H/O dizziness    Hx  . Headache(784.0)    otc med prn  . High blood pressure   . HYPERLIPIDEMIA 02/13/2010   Qualifier: Diagnosis of  By: Jorene Minors, Scott    . INSOMNIA 08/24/2008   Qualifier: Diagnosis of  By: Radene Ou MD, [place]    . Morbid obesity ([place]) 05/24/2008   Qualifier: Diagnosis of  By: Radene Ou MD, [place]    . SVD (spontaneous vaginal delivery)    x 3     Family History  Problem Relation Age of Onset  . Diabetes Mother   . Hypertension Father   .  Heart disease Other   . Diabetes Other   . Hypertension Other   . Alcohol abuse Other   . Hypertension Brother   . Asthma Brother   . Hypertension Brother   . Asthma Brother   . COPD Maternal Grandmother   . Lung cancer Maternal Grandmother   . Asthma Paternal Grandmother   . Asthma Paternal Aunt      Past Surgical History:  Procedure Laterality Date  . ABDOMINAL HYSTERECTOMY  03/13/2014   total   . BILATERAL SALPINGECTOMY Bilateral 04/12/2014   Procedure: BILATERAL SALPINGECTOMY;  Surgeon: Woodroe Mode, MD;  Location: [facility];  Service: Gynecology;  Laterality: Bilateral;  . CARPAL TUNNEL RELEASE  08/30/2011   Procedure: CARPAL TUNNEL RELEASE;  Surgeon: Wynonia Sours, MD;  Location:  [facility];  Service: Orthopedics;  Laterality: Right;  . CHOLECYSTECTOMY  10/11   lap choli  . GALLBLADDER SURGERY  04/04/2010  . HYSTEROSCOPY N/A 02/08/2014   Procedure: HYSTEROSCOPY;  Surgeon: Woodroe Mode, MD;  Location: [facility];  Service: Gynecology;  Laterality: N/A;  . HYSTEROSCOPY WITH NOVASURE N/A 02/08/2014   Procedure: ATTEMPTED NOVASURE ABLATION;  Surgeon: Woodroe Mode, MD;  Location: [facility];  Service: Gynecology;  Laterality: N/A;  . INTRAUTERINE DEVICE INSERTION     07/19/2013  . SEPTOPLASTY N/A 09/08/2013   Procedure: SEPTOPLASTY;  Surgeon: Ruby Cola, MD;  Location: [facility];  Service: ENT;  Laterality: N/A;  . SPINE SURGERY    . TONSILLECTOMY AND ADENOIDECTOMY Bilateral 09/08/2013   Procedure: TONSILLECTOMY ;  Surgeon: Ruby Cola, MD;  Location: [facility];  Service: ENT;  Laterality: Bilateral;  . TUBAL LIGATION    . VAGINAL HYSTERECTOMY N/A 04/12/2014   Procedure: LAPAROSCOPIC ASSISTED VAGINAL HYSTERECTOMY;  Surgeon: Woodroe Mode, MD;  Location: [facility];  Service: Gynecology;  Laterality: N/A;  . WISDOM TOOTH EXTRACTION      Social History   Socioeconomic History  . Marital status: Married    Spouse name: Not on file  . Number of children: 3  . Years of education: Not on file  . Highest education level: Not on file  Occupational History  . Not on file  Tobacco Use  . Smoking status: Current Some Day Smoker    Packs/day: 0.25    Types: Cigarettes  . Smokeless tobacco: Never Used  . Tobacco comment: 3-5 cigarettes a day  Vaping Use  . Vaping Use: Never used  Substance and Sexual Activity  . Alcohol use: No  . Drug use: No  . Sexual activity: Not on file  Other Topics Concern  . Not on file  Social History Narrative  . Not on file   Social Determinants of Health   Financial Resource Strain:   . Difficulty of Paying Living Expenses:   Food Insecurity:   . Worried About Charity fundraiser in the Last Year:   . Arboriculturist in the  Last Year:   Transportation Needs:   . Film/video editor (Medical):   Cynthia Kitchen Lack of Transportation (Non-Medical):   Physical Activity:   . Days of Exercise per Week:   . Minutes of Exercise per Session:   Stress:   . Feeling of Stress :   Social Connections:   . Frequency of Communication with Friends and Family:   . Frequency of Social Gatherings with Friends and Family:   . Attends Religious Services:   . Active Member of Clubs or Organizations:   . Attends Club  or Organization Meetings:   Cynthia Kitchen Marital Status:   Intimate Partner Violence:   . Fear of Current or Ex-Partner:   . Emotionally Abused:   Cynthia Kitchen Physically Abused:   . Sexually Abused:      No Known Allergies   Immunization History  Administered Date(s) Administered  . Influenza Whole 04/25/2010  . Influenza,inj,Quad PF,6+ Mos 03/07/2014, 03/10/2019  . Pneumococcal Polysaccharide-23 04/13/2014  . Td 08/02/2010    Outpatient Medications Prior to Visit  Medication Sig Dispense Refill  . ACCU-CHEK FASTCLIX LANCETS MISC USE TO CHECK BLOOD SUGAR TWICE DAILY  11  . ACCU-CHEK GUIDE test strip USE 1 STRIP TWICE DAILY  11  . albuterol (PROVENTIL) (2.5 MG/3ML) 0.083% nebulizer solution Take 3 mLs (2.5 mg total) by nebulization every 6 (six) hours as needed for wheezing or shortness of breath. 150 mL 1  . albuterol (VENTOLIN HFA) 108 (90 Base) MCG/ACT inhaler Inhale 2 puffs into the lungs every 6 (six) hours as needed for wheezing or shortness of breath.    . gabapentin (NEURONTIN) 600 MG tablet Take 1 tablet (600 mg total) by mouth 3 (three) times daily. (Patient taking differently: Take 600 mg by mouth 3 (three) times daily. Taking once daily) 180 tablet 0  . hydrochlorothiazide (HYDRODIURIL) 25 MG tablet Take 1 tablet (25 mg total) by mouth daily for 7 days. 7 tablet 0  . ibuprofen (ADVIL,MOTRIN) 800 MG tablet Take 800 mg by mouth every 8 (eight) hours as needed for moderate pain.    Cynthia Kitchen lidocaine (LIDODERM) 5 % Place 1 patch onto  the skin daily as needed (pain).   5  . ondansetron (ZOFRAN) 4 MG tablet Take 4 mg by mouth every 8 (eight) hours as needed for nausea or vomiting.    . Oxycodone HCl 10 MG TABS Take 10 mg by mouth every 6 (six) hours as needed (pain). Taking 3-4 times a day    . QUEtiapine Fumarate (SEROQUEL PO) Take 400 mg by mouth at bedtime. Taking for sleep    . spironolactone (ALDACTONE) 50 MG tablet Take 1 tablet (50 mg total) by mouth daily. (Patient taking differently: Take 50 mg by mouth daily. Not taking) 30 tablet 3  . XTAMPZA ER 13.5 MG C12A Take 1 capsule by mouth every 12 (twelve) hours.    Cynthia Kitchen omeprazole (PRILOSEC) 20 MG capsule Take 20 mg by mouth daily. As needed    . carbamazepine (EQUETRO) 200 MG CP12 12 hr capsule Take 200 mg by mouth daily. (Patient not taking: Reported on 12/02/2019)    . carvedilol (COREG) 25 MG tablet Take 1 tablet (25 mg total) by mouth 2 (two) times daily. (Patient not taking: Reported on 12/02/2019) 180 tablet 0  . fluticasone (FLOVENT HFA) 110 MCG/ACT inhaler Inhale 1 puff into the lungs 2 (two) times daily. (Patient not taking: Reported on 12/02/2019) 1 Inhaler 12  . isosorbide-hydrALAZINE (BIDIL) 20-37.5 MG tablet Take 1 tablet by mouth 3 (three) times daily. (Patient not taking: Reported on 12/02/2019) 120 tablet 2   No facility-administered medications prior to visit.    Review of Systems  Constitutional: Negative for chills and fever.  HENT: Positive for congestion and sore throat.        Postnasal drip  Respiratory: Positive for cough, sputum production, shortness of breath and wheezing.   Cardiovascular: Positive for leg swelling. Negative for chest pain.  Gastrointestinal: Positive for heartburn. Negative for nausea and vomiting.  Musculoskeletal: Positive for back pain.  Endo/Heme/Allergies: Positive for environmental allergies.  Objective:   Vitals:   12/02/19 1636  BP: 130/68  Pulse: 92  Temp: 98.3 F (36.8 C)  TempSrc: Oral  SpO2: 96%    Weight: (!) 442 lb (200.5 kg)  Height: 5\' 5"  (1.651 m)   96% on   RA BMI Readings from Last 3 Encounters:  12/02/19 73.55 kg/m  10/18/19 72.27 kg/m  09/06/19 73.39 kg/m   Wt Readings from Last 3 Encounters:  12/02/19 (!) 442 lb (200.5 kg)  10/18/19 (!) 441 lb (200 kg)  09/06/19 (!) 441 lb (200 kg)    Physical Exam Vitals reviewed.  Constitutional:      Comments: Morbidly obese woman in a wheelchair in no acute distress  HENT:     Head: Normocephalic and atraumatic.     Nose:     Comments: Nasal congested sounding voice. Eyes:     General: No scleral icterus. Cardiovascular:     Rate and Rhythm: Normal rate and regular rhythm.     Comments: Distant heart sounds Pulmonary:     Comments: Breathing comfortably on room air, distant breath sounds.  No conversational dyspnea.  No observed coughing. Abdominal:     General: There is no distension.     Palpations: Abdomen is soft.     Tenderness: There is no abdominal tenderness.  Musculoskeletal:     Cervical back: Neck supple.     Comments: Chronic bilateral venous stasis in bilateral lower extremities with stasis dermatitis.  Lymphadenopathy:     Cervical: No cervical adenopathy.  Skin:    General: Skin is warm.     Findings: No rash.  Neurological:     General: No focal deficit present.     Mental Status: She is alert.     Coordination: Coordination normal.  Psychiatric:        Mood and Affect: Mood normal.        Behavior: Behavior normal.      CBC    Component Value Date/Time   WBC 8.1 10/18/2019 1600   WBC 7.6 07/08/2019 2054   RBC 7.12 (HH) 10/18/2019 1600   RBC 6.73 (H) 07/08/2019 2054   HGB 17.3 (H) 10/18/2019 1600   HGB 13.3 09/12/2014 1512   HCT 52.7 (H) 10/18/2019 1600   HCT 42.4 09/12/2014 1512   PLT 290 10/18/2019 1600   MCV 74 (L) 10/18/2019 1600   MCV 64.6 (L) 09/12/2014 1512   MCH 24.3 (L) 10/18/2019 1600   MCH 22.9 (L) 07/08/2019 2054   MCHC 32.8 10/18/2019 1600   MCHC 30.3  07/08/2019 2054   RDW 20.5 (H) 10/18/2019 1600   RDW 21.6 (H) 09/12/2014 1512   LYMPHSABS 1.9 10/18/2019 1600   LYMPHSABS 2.4 09/12/2014 1512   MONOABS 0.5 07/08/2019 2054   MONOABS 0.6 09/12/2014 1512   EOSABS 0.2 10/18/2019 1600   BASOSABS 0.0 10/18/2019 1600   BASOSABS 0.1 09/12/2014 1512    CHEMISTRY No results for input(s): NA, K, CL, CO2, GLUCOSE, BUN, CREATININE, CALCIUM, MG, PHOS in the last 168 hours. CrCl cannot be calculated (Patient's most recent lab result is older than the maximum 21 days allowed.).   Chest Imaging- films reviewed: CXR 07/08/2019-low lung volumes with basilar atelectasis, cardiomegaly.  Poorly penetrated film.  Pulmonary Functions Testing Results: No flowsheet data found.   Echocardiogram 08/18/2019:  Study Quality: Technically Difficult  Normal LV systolic function with visual EF 50-55%. Left ventricle cavity  is normal in size. Normal global wall motion. Doppler evidence of grade II  diastolic dysfunction, elevated  LAP. No obvious regional wall motion  abnormalities. Moderate left ventricular hypertrophy.  Left atrial cavity is moderately dilated.  No significant valvular abnormalities.  IVC is dilated with a respiratory response of >50%.  Prior study dated 03/2017: Mild concentric remodeling of the left  ventricle. Mild decrease in global wall motion. Visual EF is 45-50%. Grade  I diastolic dysfunction, normal LAP. Left atrial cavity is mildly dilated.      Assessment & Plan:     ICD-10-CM   1. Morbid obesity due to excess calories (HCC)  E66.01 Split night study  2. Chronic heart failure with preserved ejection fraction (HCC)  I50.32 Split night study  3. Tobacco abuse  Z72.0   4. Gastroesophageal reflux disease, unspecified whether esophagitis present  K21.9   5. Allergic rhinitis, unspecified seasonality, unspecified trigger  J30.9 Pulmonary function test    IgE    CBC w/Diff  6. OSA (obstructive sleep apnea)  G47.33 Split night  study     Allergic rhinosinusitis -Start Claritin and Singulair once daily -Resume Flonase once daily -Allergy avoidance  Dyspnea and chronic cough-likely allergic asthma. -PFTs -Start Advair twice daily.  Reinforced the importance of compliance.  Rinse her mouth after every use. -Albuterol as needed -Recommend Covid, pneumonia, yearly flu vaccines -Optimize GERD and rhinosinusitis management -Check IgE and CBC with differential  GERD -Restart PPI once daily. -Discussed dietary modifications. -Long-term weight loss will be an important part of her management  Morbid obesity; concern for OSA and OHS -Agree with cardiology that bariatric surgery evaluation is appropriate -Recommend modest weight loss as a long-term goal -Split-night PSG -PFTs with ABG to evaluate for hypercapnia.    RTC in 2 months after PFTs.  > 1 hour spent on this encounter including chart review, time spent face-to-face with the patient, and documentation.   Current Outpatient Medications:  .  ACCU-CHEK FASTCLIX LANCETS MISC, USE TO CHECK BLOOD SUGAR TWICE DAILY, Disp: , Rfl: 11 .  ACCU-CHEK GUIDE test strip, USE 1 STRIP TWICE DAILY, Disp: , Rfl: 11 .  albuterol (PROVENTIL) (2.5 MG/3ML) 0.083% nebulizer solution, Take 3 mLs (2.5 mg total) by nebulization every 6 (six) hours as needed for wheezing or shortness of breath., Disp: 150 mL, Rfl: 1 .  albuterol (VENTOLIN HFA) 108 (90 Base) MCG/ACT inhaler, Inhale 2 puffs into the lungs every 6 (six) hours as needed for wheezing or shortness of breath., Disp: , Rfl:  .  gabapentin (NEURONTIN) 600 MG tablet, Take 1 tablet (600 mg total) by mouth 3 (three) times daily. (Patient taking differently: Take 600 mg by mouth 3 (three) times daily. Taking once daily), Disp: 180 tablet, Rfl: 0 .  hydrochlorothiazide (HYDRODIURIL) 25 MG tablet, Take 1 tablet (25 mg total) by mouth daily for 7 days., Disp: 7 tablet, Rfl: 0 .  ibuprofen (ADVIL,MOTRIN) 800 MG tablet, Take 800 mg  by mouth every 8 (eight) hours as needed for moderate pain., Disp: , Rfl:  .  lidocaine (LIDODERM) 5 %, Place 1 patch onto the skin daily as needed (pain). , Disp: , Rfl: 5 .  omeprazole (PRILOSEC) 20 MG capsule, Take 1 capsule (20 mg total) by mouth daily. As needed, Disp: 30 capsule, Rfl: 11 .  ondansetron (ZOFRAN) 4 MG tablet, Take 4 mg by mouth every 8 (eight) hours as needed for nausea or vomiting., Disp: , Rfl:  .  Oxycodone HCl 10 MG TABS, Take 10 mg by mouth every 6 (six) hours as needed (pain). Taking 3-4 times a day, Disp: ,  Rfl:  .  QUEtiapine Fumarate (SEROQUEL PO), Take 400 mg by mouth at bedtime. Taking for sleep, Disp: , Rfl:  .  spironolactone (ALDACTONE) 50 MG tablet, Take 1 tablet (50 mg total) by mouth daily. (Patient taking differently: Take 50 mg by mouth daily. Not taking), Disp: 30 tablet, Rfl: 3 .  XTAMPZA ER 13.5 MG C12A, Take 1 capsule by mouth every 12 (twelve) hours., Disp: , Rfl:  .  carbamazepine (EQUETRO) 200 MG CP12 12 hr capsule, Take 200 mg by mouth daily. (Patient not taking: Reported on 12/02/2019), Disp: , Rfl:  .  carvedilol (COREG) 25 MG tablet, Take 1 tablet (25 mg total) by mouth 2 (two) times daily. (Patient not taking: Reported on 12/02/2019), Disp: 180 tablet, Rfl: 0 .  cetirizine (ZYRTEC ALLERGY) 10 MG tablet, Take 1 tablet (10 mg total) by mouth daily., Disp: 30 tablet, Rfl: 11 .  fluticasone (FLONASE) 50 MCG/ACT nasal spray, Place 2 sprays into both nostrils daily., Disp: 16 g, Rfl: 11 .  fluticasone (FLOVENT HFA) 110 MCG/ACT inhaler, Inhale 1 puff into the lungs 2 (two) times daily. (Patient not taking: Reported on 12/02/2019), Disp: 1 Inhaler, Rfl: 12 .  Fluticasone-Salmeterol (ADVAIR DISKUS) 250-50 MCG/DOSE AEPB, Inhale 1 puff into the lungs 2 (two) times daily., Disp: 60 each, Rfl: 11 .  isosorbide-hydrALAZINE (BIDIL) 20-37.5 MG tablet, Take 1 tablet by mouth 3 (three) times daily. (Patient not taking: Reported on 12/02/2019), Disp: 120 tablet, Rfl: 2 .   montelukast (SINGULAIR) 10 MG tablet, Take 1 tablet (10 mg total) by mouth at bedtime., Disp: 30 tablet, Rfl: [place] Gabrial Poppell, DO [facility] 12/02/2019 5:15 PM

## 2019-12-02 NOTE — Patient Instructions (Addendum)
Thank you for visiting Dr. Carlis Abbott at [facility]. We recommend the following: Orders Placed This Encounter  Procedures  . IgE  . CBC w/Diff  . Pulmonary function test  . Split night study   Orders Placed This Encounter  Procedures  . IgE    Standing Status:   Future    Standing Expiration Date:   12/01/2020  . CBC w/Diff    Standing Status:   Future    Standing Expiration Date:   12/01/2020  . Pulmonary function test    Standing Status:   Future    Standing Expiration Date:   12/01/2020    Order Specific Question:   Where should this test be performed?    Answer:   [facility]    Order Specific Question:   Full PFT: includes the following: basic spirometry, spirometry pre & post bronchodilator, diffusion capacity (DLCO), lung volumes    Answer:   Full PFT    Order Specific Question:   ABG    Answer:   Yes  . Split night study    Standing Status:   Future    Standing Expiration Date:   12/01/2020    Order Specific Question:   Where should this test be performed:    Answer:   [facility]    Meds ordered this encounter  Medications  . Fluticasone-Salmeterol (ADVAIR DISKUS) 250-50 MCG/DOSE AEPB    Sig: Inhale 1 puff into the lungs 2 (two) times daily.    Dispense:  60 each    Refill:  11  . cetirizine (ZYRTEC ALLERGY) 10 MG tablet    Sig: Take 1 tablet (10 mg total) by mouth daily.    Dispense:  30 tablet    Refill:  11  . montelukast (SINGULAIR) 10 MG tablet    Sig: Take 1 tablet (10 mg total) by mouth at bedtime.    Dispense:  30 tablet    Refill:  11  . fluticasone (FLONASE) 50 MCG/ACT nasal spray    Sig: Place 2 sprays into both nostrils daily.    Dispense:  16 g    Refill:  11  . omeprazole (PRILOSEC) 20 MG capsule    Sig: Take 1 capsule (20 mg total) by mouth daily. As needed    Dispense:  30 capsule    Refill:  11     Stop Flovent. Start Advair two times per day- every day no matter how you are feeling. Rinse your mouth after you use it (leave it near  your toothbrush).    Return in about 2 months (around 02/01/2020).  After PFTs   Please do your part to reduce the spread of COVID-19.

## 2019-12-06 ENCOUNTER — Telehealth: Payer: Self-pay | Admitting: Nurse Practitioner

## 2019-12-06 NOTE — Telephone Encounter (Signed)
Call Pt several time to schedule appointment for wheelchair assessment left message for Pt to call the office

## 2019-12-07 ENCOUNTER — Other Ambulatory Visit (INDEPENDENT_AMBULATORY_CARE_PROVIDER_SITE_OTHER): Payer: Medicaid Other

## 2019-12-07 DIAGNOSIS — J309 Allergic rhinitis, unspecified: Secondary | ICD-10-CM | POA: Diagnosis not present

## 2019-12-08 LAB — CBC WITH DIFFERENTIAL/PLATELET
Basophils Absolute: 0.1 10*3/uL (ref 0.0–0.1)
Basophils Relative: 0.9 % (ref 0.0–3.0)
Eosinophils Absolute: 0.4 10*3/uL (ref 0.0–0.7)
Eosinophils Relative: 4.5 % (ref 0.0–5.0)
HCT: 45.7 % (ref 36.0–46.0)
Hemoglobin: 15 g/dL (ref 12.0–15.0)
Lymphocytes Relative: 26 % (ref 12.0–46.0)
Lymphs Abs: 2.4 10*3/uL (ref 0.7–4.0)
MCHC: 32.8 g/dL (ref 30.0–36.0)
MCV: 77.9 fl — ABNORMAL LOW (ref 78.0–100.0)
Monocytes Absolute: 0.7 10*3/uL (ref 0.1–1.0)
Monocytes Relative: 7.8 % (ref 3.0–12.0)
Neutro Abs: 5.6 10*3/uL (ref 1.4–7.7)
Neutrophils Relative %: 60.8 % (ref 43.0–77.0)
Platelets: 254 10*3/uL (ref 150.0–400.0)
RBC: 5.86 Mil/uL — ABNORMAL HIGH (ref 3.87–5.11)
RDW: 18.3 % — ABNORMAL HIGH (ref 11.5–15.5)
WBC: 9.2 10*3/uL (ref 4.0–10.5)

## 2019-12-08 LAB — IGE: IgE (Immunoglobulin E), Serum: 198 kU/L — ABNORMAL HIGH (ref <=114)

## 2019-12-09 ENCOUNTER — Ambulatory Visit (INDEPENDENT_AMBULATORY_CARE_PROVIDER_SITE_OTHER): Payer: Medicaid Other | Admitting: Nurse Practitioner

## 2019-12-09 ENCOUNTER — Encounter: Payer: Self-pay | Admitting: Nurse Practitioner

## 2019-12-09 ENCOUNTER — Other Ambulatory Visit: Payer: Self-pay

## 2019-12-09 VITALS — BP 160/112 | HR 94 | Temp 97.0°F | Ht 65.0 in | Wt >= 6400 oz

## 2019-12-09 DIAGNOSIS — Z789 Other specified health status: Secondary | ICD-10-CM | POA: Diagnosis not present

## 2019-12-09 DIAGNOSIS — I10 Essential (primary) hypertension: Secondary | ICD-10-CM

## 2019-12-09 DIAGNOSIS — Z6841 Body Mass Index (BMI) 40.0 and over, adult: Secondary | ICD-10-CM | POA: Diagnosis not present

## 2019-12-09 MED ORDER — CLONIDINE HCL 0.1 MG PO TABS
0.2000 mg | ORAL_TABLET | Freq: Once | ORAL | Status: AC
  Administered 2019-12-09: 0.2 mg via ORAL

## 2019-12-09 NOTE — Patient Instructions (Signed)
Steps to Quit Smoking Smoking tobacco is the leading cause of preventable death. It can affect almost every organ in the body. Smoking puts you and people around you at risk for many serious, long-lasting (chronic) diseases. Quitting smoking can be hard, but it is one of the best things that you can do for your health. It is never too late to quit. How do I get ready to quit? When you decide to quit smoking, make a plan to help you succeed. Before you quit:  Pick a date to quit. Set a date within the next 2 weeks to give you time to prepare.  Write down the reasons why you are quitting. Keep this list in places where you will see it often.  Tell your family, friends, and co-workers that you are quitting. Their support is important.  Talk with your doctor about the choices that may help you quit.  Find out if your health insurance will pay for these treatments.  Know the people, places, things, and activities that make you want to smoke (triggers). Avoid them. What first steps can I take to quit smoking?  Throw away all cigarettes at home, at work, and in your car.  Throw away the things that you use when you smoke, such as ashtrays and lighters.  Clean your car. Make sure to empty the ashtray.  Clean your home, including curtains and carpets. What can I do to help me quit smoking? Talk with your doctor about taking medicines and seeing a counselor at the same time. You are more likely to succeed when you do both.  If you are pregnant or breastfeeding, talk with your doctor about counseling or other ways to quit smoking. Do not take medicine to help you quit smoking unless your doctor tells you to do so. To quit smoking: Quit right away  Quit smoking totally, instead of slowly cutting back on how much you smoke over a period of time.  Go to counseling. You are more likely to quit if you go to counseling sessions regularly. Take medicine You may take medicines to help you quit. Some  medicines need a prescription, and some you can buy over-the-counter. Some medicines may contain a drug called nicotine to replace the nicotine in cigarettes. Medicines may:  Help you to stop having the desire to smoke (cravings).  Help to stop the problems that come when you stop smoking (withdrawal symptoms). Your doctor may ask you to use:  Nicotine patches, gum, or lozenges.  Nicotine inhalers or sprays.  Non-nicotine medicine that is taken by mouth. Find resources Find resources and other ways to help you quit smoking and remain smoke-free after you quit. These resources are most helpful when you use them often. They include:  Online chats with a counselor.  Phone quitlines.  Printed self-help materials.  Support groups or group counseling.  Text messaging programs.  Mobile phone apps. Use apps on your mobile phone or tablet that can help you stick to your quit plan. There are many free apps for mobile phones and tablets as well as websites. Examples include Quit Guide from the CDC and smokefree.gov  What things can I do to make it easier to quit?   Talk to your family and friends. Ask them to support and encourage you.  Call a phone quitline (1-800-QUIT-NOW), reach out to support groups, or work with a counselor.  Ask people who smoke to not smoke around you.  Avoid places that make you want to smoke,   such as: ? Bars. ? Parties. ? Smoke-break areas at work.  Spend time with people who do not smoke.  Lower the stress in your life. Stress can make you want to smoke. Try these things to help your stress: ? Getting regular exercise. ? Doing deep-breathing exercises. ? Doing yoga. ? Meditating. ? Doing a body scan. To do this, close your eyes, focus on one area of your body at a time from head to toe. Notice which parts of your body are tense. Try to relax the muscles in those areas. How will I feel when I quit smoking? Day 1 to 3 weeks Within the first 24 hours,  you may start to have some problems that come from quitting tobacco. These problems are very bad 2-3 days after you quit, but they do not often last for more than 2-3 weeks. You may get these symptoms:  Mood swings.  Feeling restless, nervous, angry, or annoyed.  Trouble concentrating.  Dizziness.  Strong desire for high-sugar foods and nicotine.  Weight gain.  Trouble pooping (constipation).  Feeling like you may vomit (nausea).  Coughing or a sore throat.  Changes in how the medicines that you take for other issues work in your body.  Depression.  Trouble sleeping (insomnia). Week 3 and afterward After the first 2-3 weeks of quitting, you may start to notice more positive results, such as:  Better sense of smell and taste.  Less coughing and sore throat.  Slower heart rate.  Lower blood pressure.  Clearer skin.  Better breathing.  Fewer sick days. Quitting smoking can be hard. Do not give up if you fail the first time. Some people need to try a few times before they succeed. Do your best to stick to your quit plan, and talk with your doctor if you have any questions or concerns. Summary  Smoking tobacco is the leading cause of preventable death. Quitting smoking can be hard, but it is one of the best things that you can do for your health.  When you decide to quit smoking, make a plan to help you succeed.  Quit smoking right away, not slowly over a period of time.  When you start quitting, seek help from your doctor, family, or friends. This information is not intended to replace advice given to you by your health care provider. Make sure you discuss any questions you have with your health care provider. Document Revised: 03/05/2019 Document Reviewed: 08/29/2018 Elsevier Patient Education  2020 Elsevier Inc.  

## 2019-12-09 NOTE — Progress Notes (Signed)
[address] [place], [place]  [address] Phone:  [phone]   Fax:  [phone]   Established Patient Office Visit  Subjective:  Patient ID: Cynthia Becker, female    DOB: [birth year]  Age: 49 y.o. MRN: [id number]  CC:  Chief Complaint  Patient presents with   Follow-up    needing wheelchair; unable to stand or walk more than 5 minutes; medication refills   Dental Pain    tooth pain is coming back, has not been able to get it taken out;     HPI Cynthia Becker presents for evaluation for wheelchair. She is in today with her husband. She  has a past medical history of Anemia, Anxiety, Arthritis, Asthma, CN (constipation) (09/14/2014), DEGENERATIVE DISC DISEASE, LUMBOSACRAL SPINE W/RADICULOPATHY (11/01/2009), Depression, Diabetes ([place]), H/O dizziness, Headache(784.0), High blood pressure, HYPERLIPIDEMIA (02/13/2010), INSOMNIA (08/24/2008), Morbid obesity ([place]) (05/24/2008), and SVD (spontaneous vaginal delivery).   She has been trying to walk without the wheelchair. However she is has not been able to walk because she is having increased pain. She is gaining weight and she is having more breathing issues. She has been followed by cardiology and pulmonology and they are encouraging her to have the weight loss surgery. She is thinking more about having this done. She is going to have the sleep study in patient on 12/22/19.   She admits that she is not able to get in her kitchen. She is wanting to go in the store with her family. She is seeking quality of life per her husband.   Past Medical History:  Diagnosis Date   Anemia    iv iron treatment dec 2014/ see oncology for this last in dec 2014   Anxiety    Arthritis    knee, hands   Asthma    seasonal related/ albuterol used when uri   CN (constipation) 09/14/2014   DEGENERATIVE DISC DISEASE, LUMBOSACRAL SPINE W/RADICULOPATHY 11/01/2009   Qualifier: Diagnosis of  By: Jorene Minors, Scott     Depression     Diabetes ([place])    H/O dizziness    Hx   Headache(784.0)    otc med prn   High blood pressure    HYPERLIPIDEMIA 02/13/2010   Qualifier: Diagnosis of  By: Jorene Minors, Scott     INSOMNIA 08/24/2008   Qualifier: Diagnosis of  By: Radene Ou MD, [place]     Morbid obesity ([place]) 05/24/2008   Qualifier: Diagnosis of  By: Radene Ou MD, [place]     SVD (spontaneous vaginal delivery)    x 3    Past Surgical History:  Procedure Laterality Date   ABDOMINAL HYSTERECTOMY  03/13/2014   total    BILATERAL SALPINGECTOMY Bilateral 04/12/2014   Procedure: BILATERAL SALPINGECTOMY;  Surgeon: Woodroe Mode, MD;  Location: [facility];  Service: Gynecology;  Laterality: Bilateral;   CARPAL TUNNEL RELEASE  08/30/2011   Procedure: CARPAL TUNNEL RELEASE;  Surgeon: Wynonia Sours, MD;  Location: [facility];  Service: Orthopedics;  Laterality: Right;   CHOLECYSTECTOMY  10/11   lap choli   GALLBLADDER SURGERY  04/04/2010   HYSTEROSCOPY N/A 02/08/2014   Procedure: HYSTEROSCOPY;  Surgeon: Woodroe Mode, MD;  Location: [facility];  Service: Gynecology;  Laterality: N/A;   HYSTEROSCOPY WITH NOVASURE N/A 02/08/2014   Procedure: ATTEMPTED NOVASURE ABLATION;  Surgeon: Woodroe Mode, MD;  Location: [facility];  Service: Gynecology;  Laterality: N/A;   INTRAUTERINE DEVICE INSERTION     07/19/2013  SEPTOPLASTY N/A 09/08/2013   Procedure: SEPTOPLASTY;  Surgeon: Ruby Cola, MD;  Location: [facility];  Service: ENT;  Laterality: N/A;   SPINE SURGERY     TONSILLECTOMY AND ADENOIDECTOMY Bilateral 09/08/2013   Procedure: TONSILLECTOMY ;  Surgeon: Ruby Cola, MD;  Location: [facility];  Service: ENT;  Laterality: Bilateral;   TUBAL LIGATION     VAGINAL HYSTERECTOMY N/A 04/12/2014   Procedure: LAPAROSCOPIC ASSISTED VAGINAL HYSTERECTOMY;  Surgeon: Woodroe Mode, MD;  Location: [facility];  Service: Gynecology;  Laterality: N/A;   WISDOM TOOTH EXTRACTION      Family History  Problem Relation Age of Onset    Diabetes Mother    Hypertension Father    Heart disease Other    Diabetes Other    Hypertension Other    Alcohol abuse Other    Hypertension Brother    Asthma Brother    Hypertension Brother    Asthma Brother    COPD Maternal Grandmother    Lung cancer Maternal Grandmother    Asthma Paternal Grandmother    Asthma Paternal Aunt     Social History   Socioeconomic History   Marital status: Married    Spouse name: Not on file   Number of children: 3   Years of education: Not on file   Highest education level: Not on file  Occupational History   Not on file  Tobacco Use   Smoking status: Current Some Day Smoker    Packs/day: 0.25    Types: Cigarettes   Smokeless tobacco: Never Used   Tobacco comment: 4-5 cigarettes a day  Vaping Use   Vaping Use: Never used  Substance and Sexual Activity   Alcohol use: No   Drug use: No   Sexual activity: Not on file  Other Topics Concern   Not on file  Social History Narrative   Not on file   Social Determinants of Health   Financial Resource Strain:    Difficulty of Paying Living Expenses:   Food Insecurity:    Worried About Charity fundraiser in the Last Year:    Arboriculturist in the Last Year:   Transportation Needs:    Film/video editor (Medical):    Lack of Transportation (Non-Medical):   Physical Activity:    Days of Exercise per Week:    Minutes of Exercise per Session:   Stress:    Feeling of Stress :   Social Connections:    Frequency of Communication with Friends and Family:    Frequency of Social Gatherings with Friends and Family:    Attends Religious Services:    Active Member of Clubs or Organizations:    Attends Music therapist:    Marital Status:   Intimate Partner Violence:    Fear of Current or Ex-Partner:    Emotionally Abused:    Physically Abused:    Sexually Abused:     Outpatient Medications Prior to Visit  Medication Sig  Dispense Refill   ACCU-CHEK FASTCLIX LANCETS MISC USE TO CHECK BLOOD SUGAR TWICE DAILY  11   ACCU-CHEK GUIDE test strip USE 1 STRIP TWICE DAILY  11   albuterol (PROVENTIL) (2.5 MG/3ML) 0.083% nebulizer solution Take 3 mLs (2.5 mg total) by nebulization every 6 (six) hours as needed for wheezing or shortness of breath. 150 mL 1   albuterol (VENTOLIN HFA) 108 (90 Base) MCG/ACT inhaler Inhale 2 puffs into the lungs every 6 (six) hours as needed for wheezing or shortness of  breath.     cetirizine (ZYRTEC ALLERGY) 10 MG tablet Take 1 tablet (10 mg total) by mouth daily. 30 tablet 11   fluticasone (FLONASE) 50 MCG/ACT nasal spray Place 2 sprays into both nostrils daily. 16 g 11   fluticasone (FLOVENT HFA) 110 MCG/ACT inhaler Inhale 1 puff into the lungs 2 (two) times daily. 1 Inhaler 12   Fluticasone-Salmeterol (ADVAIR DISKUS) 250-50 MCG/DOSE AEPB Inhale 1 puff into the lungs 2 (two) times daily. 60 each 11   ibuprofen (ADVIL,MOTRIN) 800 MG tablet Take 800 mg by mouth every 8 (eight) hours as needed for moderate pain.     isosorbide-hydrALAZINE (BIDIL) 20-37.5 MG tablet Take 1 tablet by mouth 3 (three) times daily. 120 tablet 2   montelukast (SINGULAIR) 10 MG tablet Take 1 tablet (10 mg total) by mouth at bedtime. 30 tablet 11   omeprazole (PRILOSEC) 20 MG capsule Take 1 capsule (20 mg total) by mouth daily. As needed 30 capsule 11   ondansetron (ZOFRAN) 4 MG tablet Take 4 mg by mouth every 8 (eight) hours as needed for nausea or vomiting.     Oxycodone HCl 10 MG TABS Take 10 mg by mouth every 6 (six) hours as needed (pain). Taking 3-4 times a day     QUEtiapine Fumarate (SEROQUEL PO) Take 400 mg by mouth at bedtime. Taking for sleep     carbamazepine (EQUETRO) 200 MG CP12 12 hr capsule Take 200 mg by mouth daily. (Patient not taking: Reported on 12/02/2019)     carvedilol (COREG) 25 MG tablet Take 1 tablet (25 mg total) by mouth 2 (two) times daily. (Patient not taking: Reported on  12/02/2019) 180 tablet 0   gabapentin (NEURONTIN) 600 MG tablet Take 1 tablet (600 mg total) by mouth 3 (three) times daily. (Patient taking differently: Take 600 mg by mouth 3 (three) times daily. Taking once daily) 180 tablet 0   hydrochlorothiazide (HYDRODIURIL) 25 MG tablet Take 1 tablet (25 mg total) by mouth daily for 7 days. 7 tablet 0   lidocaine (LIDODERM) 5 % Place 1 patch onto the skin daily as needed (pain).  (Patient not taking: Reported on 12/09/2019)  5   spironolactone (ALDACTONE) 50 MG tablet Take 1 tablet (50 mg total) by mouth daily. (Patient taking differently: Take 50 mg by mouth daily. Not taking) 30 tablet 3   XTAMPZA ER 13.5 MG C12A Take 1 capsule by mouth every 12 (twelve) hours.     No facility-administered medications prior to visit.    No Known Allergies  ROS Review of Systems  All other systems reviewed and are negative.     Objective:    Physical Exam Constitutional:      General: She is not in acute distress.    Appearance: She is obese. She is not ill-appearing or toxic-appearing.  HENT:     Head: Normocephalic and atraumatic.     Nose: Nose normal.     Mouth/Throat:     Mouth: Mucous membranes are moist.  Eyes:     Pupils: Pupils are equal, round, and reactive to light.  Cardiovascular:     Rate and Rhythm: Normal rate.     Pulses: Normal pulses.  Pulmonary:     Effort: Pulmonary effort is normal.  Abdominal:     Comments: obese  Musculoskeletal:     Cervical back: Normal range of motion.     Comments: cane  Skin:    General: Skin is warm and dry.  Neurological:  Mental Status: She is alert and oriented to person, place, and time.  Psychiatric:        Mood and Affect: Mood normal.        Behavior: Behavior normal.        Thought Content: Thought content normal.        Judgment: Judgment normal.     BP (!) 160/112 (BP Location: Left Arm, Patient Position: Sitting, Cuff Size: Large)    Pulse 94    Temp (!) 97 F (36.1 C)    Ht  5\' 5"  (1.651 m)    Wt (!) 443 lb 0.8 oz (201 kg)    LMP 02/13/2014    SpO2 93%    BMI 73.73 kg/m  Wt Readings from Last 3 Encounters:  12/09/19 (!) 443 lb 0.8 oz (201 kg)  12/02/19 (!) 442 lb (200.5 kg)  10/18/19 (!) 441 lb (200 kg)     Health Maintenance Due  Topic Date Due   Hepatitis C Screening  Never done   COVID-19 Vaccine (1) Never done   URINE MICROALBUMIN  08/03/2011    There are no preventive care reminders to display for this patient.  Lab Results  Component Value Date   TSH 1.895 04/07/2013   Lab Results  Component Value Date   WBC 9.2 12/07/2019   HGB 15.0 12/07/2019   HCT 45.7 12/07/2019   MCV 77.9 (L) 12/07/2019   PLT 254.0 12/07/2019   Lab Results  Component Value Date   NA 141 08/17/2019   K 4.7 08/17/2019   CHLORIDE 107 04/07/2013   CO2 28 08/17/2019   GLUCOSE 116 (H) 08/17/2019   BUN 10 08/17/2019   CREATININE 0.86 08/17/2019   BILITOT 0.9 07/08/2019   ALKPHOS 74 07/08/2019   AST 17 07/08/2019   ALT 20 07/08/2019   PROT 8.4 (H) 07/08/2019   ALBUMIN 3.8 07/08/2019   CALCIUM 9.0 08/17/2019   ANIONGAP 9 07/08/2019   Lab Results  Component Value Date   CHOL 188 08/17/2010   Lab Results  Component Value Date   HDL 43 08/17/2010   Lab Results  Component Value Date   LDLCALC 127 (H) 08/17/2010   Lab Results  Component Value Date   TRIG 91 08/17/2010   Lab Results  Component Value Date   CHOLHDL 4.4 Ratio 08/17/2010   Lab Results  Component Value Date   HGBA1C 6.7 (A) 10/18/2019   HGBA1C 6.7 10/18/2019   HGBA1C 6.7 (A) 10/18/2019   HGBA1C 6.7 10/18/2019      Assessment & Plan:  Does the patient require and use a wheelchair to move around in the home?  yes  Does the patient have quadriplegia or a fixed hip angle?  no  Does the patient have a trunk or brace cast or other brace that requires a reclining back feature for positioning? no  Does the patient have excessive extensor tone of the trunk muscles?  no  Does the  patient need to rest in a recumbent position two or more times a day? yes  The patient has the following condition(s) that prevent a 90 degree flexion of the knee: Significant edema of the lower extremities  Does the patient have a need for arm height different than available using non-adjustable arms? no  How many hours per day does the patient usually spend in the  wheelchair? ( round up to the next hour )  1-2 .  Is the patient able to adequately self-propel in the standard weight wheelchair?  no  If not, would the patient be able to adequately self propel in the  wheelchair being ordered?  no  The answers to the above questions were provided by:  Patient Problem List Items Addressed This Visit       Cardiovascular and Mediastinum   High blood pressure - Primary Continue to follow up with cardiology     Other Visit Diagnoses    Morbid obesity with BMI of 70 and over, adult ([place])    Encourage lifestyle modifications decreased portion sizes and in chair exercises    Poor tolerance for ambulation Recommendation for wheelchair to assist with ambulation          Meds ordered this encounter  Medications   cloNIDine (CATAPRES) tablet 0.2 mg    Follow-up: No follow-ups on file.    [name], NP

## 2019-12-09 NOTE — Progress Notes (Signed)
Please let her know that her allergic asthma labs were abnormal as we had expected.  Thanks!Julian Hy, DO 12/09/19 8:27 PM [facility]

## 2019-12-10 NOTE — Progress Notes (Signed)
Patient called with results of recent lab work  Per Dr. Carlis Abbott, please let her know that her allergic asthma labs were abnormal as we had expected.  Thanks!  Patient verbalized understanding of results.

## 2019-12-13 ENCOUNTER — Ambulatory Visit: Payer: Medicaid Other | Admitting: Cardiology

## 2019-12-20 ENCOUNTER — Other Ambulatory Visit (HOSPITAL_COMMUNITY)
Admission: RE | Admit: 2019-12-20 | Discharge: 2019-12-20 | Disposition: A | Discharge status: Home / Self Care | Payer: Medicaid Other | Source: Ambulatory Visit | Attending: Pulmonary Disease | Admitting: Pulmonary Disease

## 2019-12-20 DIAGNOSIS — Z20822 Contact with and (suspected) exposure to covid-19: Secondary | ICD-10-CM | POA: Diagnosis not present

## 2019-12-20 DIAGNOSIS — Z01812 Encounter for preprocedural laboratory examination: Secondary | ICD-10-CM | POA: Diagnosis not present

## 2019-12-20 LAB — SARS CORONAVIRUS 2 (TAT 6-24 HRS): SARS Coronavirus 2: NEGATIVE

## 2019-12-21 ENCOUNTER — Other Ambulatory Visit: Payer: Self-pay | Admitting: Cardiology

## 2019-12-21 DIAGNOSIS — I10 Essential (primary) hypertension: Secondary | ICD-10-CM

## 2019-12-22 ENCOUNTER — Other Ambulatory Visit: Payer: Self-pay

## 2019-12-22 ENCOUNTER — Ambulatory Visit (HOSPITAL_BASED_OUTPATIENT_CLINIC_OR_DEPARTMENT_OTHER): Payer: Medicaid Other | Attending: Critical Care Medicine | Admitting: Pulmonary Disease

## 2019-12-22 DIAGNOSIS — R0902 Hypoxemia: Secondary | ICD-10-CM | POA: Diagnosis not present

## 2019-12-22 DIAGNOSIS — Z6841 Body Mass Index (BMI) 40.0 and over, adult: Secondary | ICD-10-CM | POA: Insufficient documentation

## 2019-12-22 DIAGNOSIS — Z791 Long term (current) use of non-steroidal anti-inflammatories (NSAID): Secondary | ICD-10-CM | POA: Diagnosis not present

## 2019-12-22 DIAGNOSIS — G4733 Obstructive sleep apnea (adult) (pediatric): Secondary | ICD-10-CM | POA: Insufficient documentation

## 2019-12-22 DIAGNOSIS — E119 Type 2 diabetes mellitus without complications: Secondary | ICD-10-CM | POA: Insufficient documentation

## 2019-12-22 DIAGNOSIS — G4734 Idiopathic sleep related nonobstructive alveolar hypoventilation: Secondary | ICD-10-CM

## 2019-12-22 DIAGNOSIS — I5032 Chronic diastolic (congestive) heart failure: Secondary | ICD-10-CM | POA: Insufficient documentation

## 2019-12-22 DIAGNOSIS — R0683 Snoring: Secondary | ICD-10-CM | POA: Diagnosis not present

## 2019-12-22 DIAGNOSIS — Z79899 Other long term (current) drug therapy: Secondary | ICD-10-CM | POA: Insufficient documentation

## 2019-12-27 IMAGING — DX PORTABLE CHEST - 1 VIEW
1 series · 1 of 1 positions shown · non-contrast
Comparison: 03/13/2016

CLINICAL DATA: Asthma exacerbation.  Short of breath

EXAM:
PORTABLE CHEST 1 VIEW

[chest ap]
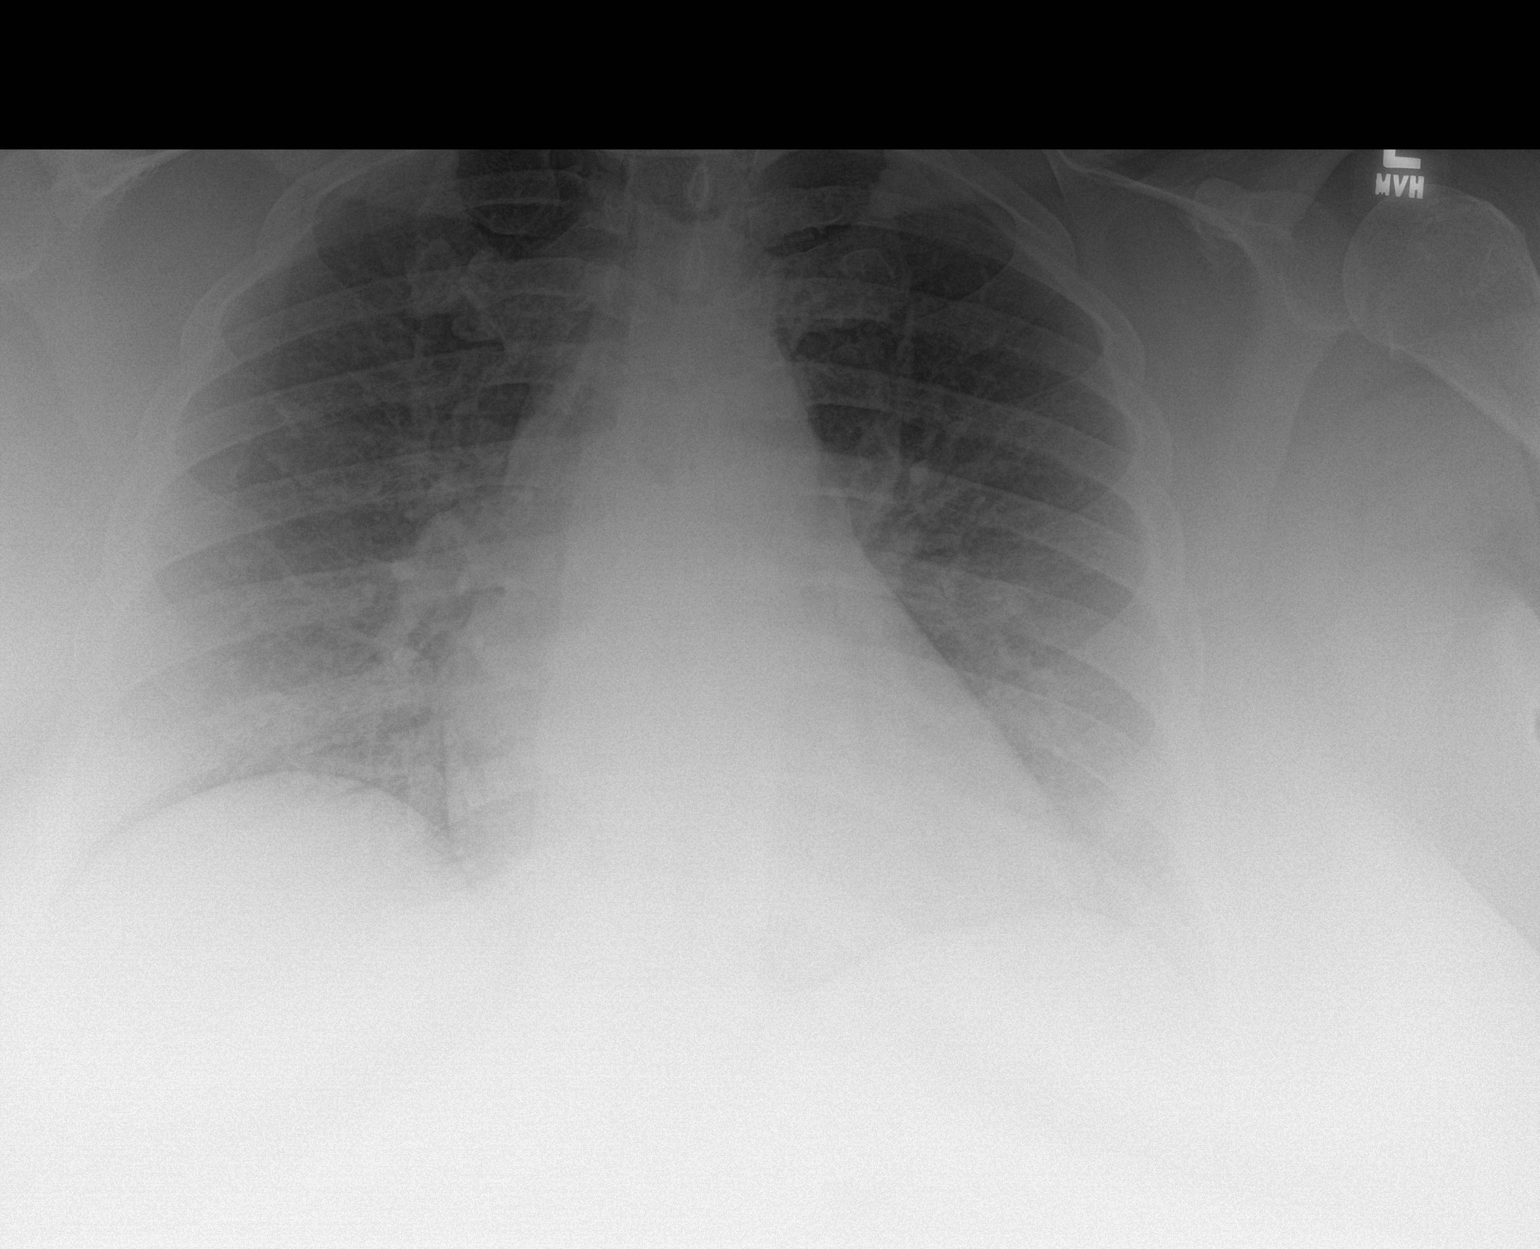

[1 of 1 positions shown; findings below may reference images not displayed]

FINDINGS: Prominent heart size without heart failure or edema. No definite
infiltrate however image quality is suboptimal due to portable
technique and large patient size. No effusion.
IMPRESSION: No active disease.

## 2019-12-27 NOTE — Procedures (Signed)
   Patient Name: Cynthia Becker, [place] Date: 12/22/2019 Gender: Female D.O.B: September 19, 1970 Age (years): 48 Referring Provider: Noemi Chapel DO Height (inches): 64 Interpreting Physician: Chesley Mires MD, ABSM Weight (lbs): 443 RPSGT: Laren Everts BMI: 74 MRN: [id number] Neck Size: 17.00  CLINICAL INFORMATION Sleep Study Type: NPSG  Indication for sleep study: Diabetes, Excessive Daytime Sleepiness, Fatigue, Hypertension, Morning Headaches, Obesity, Re-Evaluation, Witnessed Apneas  Most recent polysomnogram dated 08/04/2017 revealed an AHI of 4.4/h.  SLEEP STUDY TECHNIQUE As per the AASM Manual for the Scoring of Sleep and Associated Events v2.3 (April 2016) with a hypopnea requiring 4% desaturations.  The channels recorded and monitored were frontal, central and occipital EEG, electrooculogram (EOG), submentalis EMG (chin), nasal and oral airflow, thoracic and abdominal wall motion, anterior tibialis EMG, snore microphone, electrocardiogram, and pulse oximetry.  MEDICATIONS Medications self-administered by patient taken the night of the study : SEROQUEL, IBUPROFEN, Xtampza ER, OXYCODONE HCL, GABAPENTIN, MONTELUKAST  SLEEP ARCHITECTURE The study was initiated at 10:50:16 PM and ended at 5:11:15 AM.  Sleep onset time was 22.9 minutes and the sleep efficiency was 80.5%%. The total sleep time was 306.5 minutes.  Stage REM latency was 119.0 minutes.  The patient spent 8.0%% of the night in stage N1 sleep, 74.7%% in stage N2 sleep, 0.0%% in stage N3 and 17.3% in REM.  Alpha intrusion was absent.  Supine sleep was 100.00%.  RESPIRATORY PARAMETERS The overall apnea/hypopnea index (AHI) was 3.1 per hour. There were 3 total apneas, including 3 obstructive, 0 central and 0 mixed apneas. There were 13 hypopneas and 7 RERAs.  The AHI during Stage REM sleep was 18.1 per hour.  AHI while supine was 3.1 per hour.  The mean oxygen saturation was 90.3%. The minimum SpO2 during sleep  was 79.0%.  She spent 120.1 minutes with an SpO2 < 88%.  She had 1 liter supplemental oxygen applied.  Soft snoring was noted during this study.  CARDIAC DATA The 2 lead EKG demonstrated sinus rhythm. The mean heart rate was 90.2 beats per minute. Other EKG findings include: PVCs.  LEG MOVEMENT DATA The total PLMS were 0 with a resulting PLMS index of 0.0. Associated arousal with leg movement index was 0.0 .  IMPRESSIONS - While she had several obstructive respiratory events, these were not frequent enough to qualify for a diagnosis of obstructive sleep apnea.  Her overall AHI was 3.1/hour with the majority of events happening in REM sleep. - She was noted to have an SpO2 < 88% lasting for more than 5 minutes in the abscence of other respiratory events.  She had 1 liter supplemental oxygen applied. - Soft snoring noted.  DIAGNOSIS - Nocturnal Hypoxemia (327.26 [G47.36 ICD-10])  RECOMMENDATIONS - She should be started on 1 liter supplemental oxygen at night. - She should receive education on options to assist with weight loss.  [Electronically signed] 12/27/2019 11:37 AM  Chesley Mires MD, ABSM Diplomate, American Board of Sleep Medicine   NPI: [id number]

## 2020-01-14 ENCOUNTER — Other Ambulatory Visit: Payer: Self-pay

## 2020-01-14 ENCOUNTER — Ambulatory Visit: Payer: Medicaid Other | Admitting: Cardiology

## 2020-01-14 ENCOUNTER — Encounter: Payer: Self-pay | Admitting: Cardiology

## 2020-01-14 DIAGNOSIS — I10 Essential (primary) hypertension: Secondary | ICD-10-CM

## 2020-01-14 MED ORDER — SPIRONOLACTONE 50 MG PO TABS: 50.0000 mg | ORAL_TABLET | Freq: Every day | ORAL | 3 refills | Status: DC

## 2020-01-14 MED ORDER — BIDIL 20-37.5 MG PO TABS: 1.0000 | ORAL_TABLET | Freq: Two times a day (BID) | ORAL | 2 refills | Status: DC

## 2020-01-14 NOTE — Progress Notes (Signed)
Subjective:   Cynthia Becker, female    DOB: 05/10/1971, 49 y.o.   MRN: [id number]   Chief complaint:  Hypertension  49 year old African-American female with hypertension, hyperlipidemia, type 2 DM, morbid obesity, hyperlipidemia.  Blood pressure log reviewed. Average BP is 123/80 mmHg. BP elevated today, but she endorses a lot of pain from her left hip. She has been consuming close to 2000 mg ibuprofen everyday. She has not lost any weight and continues to consume fruit juices. She was recommend nocturnal oxygen by pulmonology.   Current Outpatient Medications on File Prior to Visit  Medication Sig Dispense Refill  . ACCU-CHEK FASTCLIX LANCETS MISC USE TO CHECK BLOOD SUGAR TWICE DAILY  11  . ACCU-CHEK GUIDE test strip USE 1 STRIP TWICE DAILY  11  . albuterol (PROVENTIL) (2.5 MG/3ML) 0.083% nebulizer solution Take 3 mLs (2.5 mg total) by nebulization every 6 (six) hours as needed for wheezing or shortness of breath. 150 mL 1  . albuterol (VENTOLIN HFA) 108 (90 Base) MCG/ACT inhaler Inhale 2 puffs into the lungs every 6 (six) hours as needed for wheezing or shortness of breath.    . cetirizine (ZYRTEC ALLERGY) 10 MG tablet Take 1 tablet (10 mg total) by mouth daily. 30 tablet 11  . fluticasone (FLONASE) 50 MCG/ACT nasal spray Place 2 sprays into both nostrils daily. 16 g 11  . fluticasone (FLOVENT HFA) 110 MCG/ACT inhaler Inhale 1 puff into the lungs 2 (two) times daily. 1 Inhaler 12  . Fluticasone-Salmeterol (ADVAIR DISKUS) 250-50 MCG/DOSE AEPB Inhale 1 puff into the lungs 2 (two) times daily. 60 each 11  . gabapentin (NEURONTIN) 600 MG tablet Take 1 tablet (600 mg total) by mouth 3 (three) times daily. (Patient taking differently: Take 600 mg by mouth 3 (three) times daily. Taking once daily) 180 tablet 0  . ibuprofen (ADVIL,MOTRIN) 800 MG tablet Take 800 mg by mouth every 8 (eight) hours as needed for moderate pain.    . isosorbide-hydrALAZINE (BIDIL) 20-37.5 MG tablet Take 1 tablet by  mouth 3 (three) times daily. 120 tablet 2  . lidocaine (LIDODERM) 5 % Place 1 patch onto the skin daily as needed (pain).   5  . montelukast (SINGULAIR) 10 MG tablet Take 1 tablet (10 mg total) by mouth at bedtime. 30 tablet 11  . omeprazole (PRILOSEC) 20 MG capsule Take 1 capsule (20 mg total) by mouth daily. As needed 30 capsule 11  . ondansetron (ZOFRAN) 4 MG tablet Take 4 mg by mouth every 8 (eight) hours as needed for nausea or vomiting.    . Oxycodone HCl 10 MG TABS Take 10 mg by mouth every 6 (six) hours as needed (pain). Taking 3-4 times a day    . QUEtiapine Fumarate (SEROQUEL PO) Take 400 mg by mouth at bedtime. Taking for sleep    . spironolactone (ALDACTONE) 50 MG tablet Take 1 tablet by mouth once daily 30 tablet 0  . carbamazepine (EQUETRO) 200 MG CP12 12 hr capsule Take 200 mg by mouth daily. (Patient not taking: Reported on 12/02/2019)    . carvedilol (COREG) 25 MG tablet Take 1 tablet (25 mg total) by mouth 2 (two) times daily. (Patient not taking: Reported on 12/02/2019) 180 tablet 0  . hydrochlorothiazide (HYDRODIURIL) 25 MG tablet Take 1 tablet (25 mg total) by mouth daily for 7 days. (Patient not taking: Reported on 01/14/2020) 7 tablet 0  . XTAMPZA ER 13.5 MG C12A Take 1 capsule by mouth every 12 (twelve) hours. (Patient not taking:  Reported on 01/14/2020)     No current facility-administered medications on file prior to visit.    Cardiovascular studies:  EKG 09/06/2019: Sinus rhythm 79 bpm. Normal EKG.   Echocardiogram 08/18/2019:  Study Quality: Technically Difficult  Normal LV systolic function with visual EF 50-55%. Left ventricle cavity  is normal in size. Normal global wall motion. Doppler evidence of grade II  diastolic dysfunction, elevated LAP. No obvious regional wall motion  abnormalities. Moderate left ventricular hypertrophy.  Left atrial cavity is moderately dilated.  No significant valvular abnormalities.  IVC is dilated with a respiratory response of  >50%.  Prior study dated 03/2017: Mild concentric remodeling of the left  ventricle. Mild decrease in global wall motion. Visual EF is 45-50%. Grade  I diastolic dysfunction, normal LAP. Left atrial cavity is mildly dilated.  Recent labs: 12/07/2019: H/H 15/45. MCV 78. Platelets 254  08/17/2019: Glucose 116, BUN/Cr 10/0.86. EGFR 92. Na/K 141/4.7.   07/08/2019: Glucose 123, BUN/Cr 11/0.73. EGFR >60. Na/K 138/4.8. Rest of the CMP normal H/H 15/50. MCV 75. Platelets 301 HbA1C 6.8%    Review of Systems  Cardiovascular: Positive for dyspnea on exertion and leg swelling. Negative for chest pain, palpitations and syncope.  Respiratory: Positive for shortness of breath.   Gastrointestinal: Negative for nausea and vomiting.  All other systems reviewed and are negative.        Vitals:   01/14/20 1420 01/14/20 1425  BP: (!) 152/90 (!) 151/87  Pulse: 100 98  SpO2: 95%       Objective:    Physical Exam Vitals and nursing note reviewed.  Constitutional:      General: She is not in acute distress.    Appearance: She is well-developed.     Comments: Morbidly obese  Neck:     Vascular: No JVD.  Cardiovascular:     Rate and Rhythm: Normal rate and regular rhythm.     Pulses: Intact distal pulses.     Heart sounds: No murmur heard.   Pulmonary:     Effort: Pulmonary effort is normal.           Assessment & Recommendations:   49 year old African-American female with hypertension, hyperlipidemia, type 2 DM, morbid obesity, hyperlipidemia  Hypertension: Currently on daily, BiDil 20-37.5 mg to 1 tab tid,spironolactone 50 mg daily.  Continue the same.  Morbid obesity: This remains her primary comorbidity, likely responsible for most of her medical issues.  Strongly recommend considering surgical weight loss, in addition to diet and lifestyle changes. She is motivated and wants to lose weight.  Type 2 DM: Follow up with PCP.  Cautioned against use of ibuprofen. Needs  f/u w/pain management.   F/u in 1 year  Nigel Mormon, MD [facility] Pager: [phone] Office: [phone] If no answer Cell [phone]

## 2020-01-17 ENCOUNTER — Encounter: Payer: Self-pay | Admitting: Nurse Practitioner

## 2020-01-17 ENCOUNTER — Ambulatory Visit (INDEPENDENT_AMBULATORY_CARE_PROVIDER_SITE_OTHER): Payer: Medicaid Other | Admitting: Nurse Practitioner

## 2020-01-17 ENCOUNTER — Other Ambulatory Visit: Payer: Self-pay

## 2020-01-17 VITALS — BP 120/75 | Temp 98.3°F | Resp 16 | Ht 65.0 in

## 2020-01-17 DIAGNOSIS — M5126 Other intervertebral disc displacement, lumbar region: Secondary | ICD-10-CM | POA: Diagnosis not present

## 2020-01-17 DIAGNOSIS — M79604 Pain in right leg: Secondary | ICD-10-CM | POA: Diagnosis not present

## 2020-01-17 DIAGNOSIS — Z789 Other specified health status: Secondary | ICD-10-CM

## 2020-01-17 DIAGNOSIS — Z6841 Body Mass Index (BMI) 40.0 and over, adult: Secondary | ICD-10-CM

## 2020-01-17 DIAGNOSIS — R29898 Other symptoms and signs involving the musculoskeletal system: Secondary | ICD-10-CM | POA: Diagnosis not present

## 2020-01-17 MED ORDER — IBUPROFEN 800 MG PO TABS: 800.0000 mg | ORAL_TABLET | Freq: Three times a day (TID) | ORAL | 2 refills | Status: DC | PRN

## 2020-01-17 MED ORDER — IBUPROFEN 800 MG PO TABS: 800.0000 mg | ORAL_TABLET | Freq: Three times a day (TID) | ORAL | 2 refills | Status: AC | PRN

## 2020-01-17 MED ORDER — GABAPENTIN 600 MG PO TABS: 600.0000 mg | ORAL_TABLET | Freq: Three times a day (TID) | ORAL | 0 refills | Status: DC

## 2020-01-17 MED ORDER — LINACLOTIDE 290 MCG PO CAPS: 290.0000 ug | ORAL_CAPSULE | Freq: Every day | ORAL | 3 refills | Status: DC

## 2020-01-17 NOTE — Progress Notes (Signed)
[place], [place]  [address] Phone:  [phone]   Fax:  [phone]   Established Patient Office Visit  Subjective:  Patient ID: Cynthia Becker, female    DOB: 02-Mar-1971  Age: 49 y.o. MRN: [id number]  CC:  Chief Complaint  Patient presents with  . Leg Pain    wants rx for ibuprofen 800mg    . Nausea  . Follow-up    wants rx for wheelchair re-done     HPI Cynthia Becker presents for follow up.  Nausea / Vomiting Patient complains of nausea and vomiting. Onset of symptoms was several years ago. Patient describes nausea as moderate. Vomiting has occurred 2 times over the past 1 day. Vomitus is described as normal gastric contents. Symptoms have been associated with certain foods. She has a metalic taste. Patient denies fever, hematemesis, melena and possibility of pregnancy. Symptoms have stabilized. Evaluation to date has been see lab results. Treatment to date has been ondansetron. She feels like this is related to the [place].  Knee Pain Patient presents with knee pain involving the right knee. Onset of the symptoms was several years ago. Inciting event: none known. Current symptoms include giving out, popping sensation, stiffness, swelling and pain. Pain is aggravated by any weight bearing. Patient has had prior knee problems. Evaluation to date: plain films: abnormal mid degenertive changes in 2015. She was followed by pain management . However is now seeking a new provider. Treatment to date: avoidance of offending activity, OTC analgesics which are not very effective, prescription NSAIDS which are not very effective and rest. She is seeking quality of life. She has been denied for [facility] in the past but admits that she can not walk now due to the pain.   Hip Pain Patient complains of left hip pain. Onset of the symptoms was several years ago. Inciting event: fall. The patient reports the hip pain is worse with weight bearing and is  aggravated by walking. Associated symptoms: none, knee giving out, knee locking. Aggravating symptoms include: any weight bearing. Patient has had prior hip problems. Previous visits for this problem: multiple, this is a longstanding diagnosis.  Evaluation to date: CTscan. Treatment to date: prescription analgesics, which have been somewhat effective.  Constipation Patient complains of constipation. Onset was several months ago. Patient has been having occasional formed stools per week. Defecation has been difficult. Co-Morbid conditions:obesity. Symptoms have gradually worsened. Current Health Habits: Eating fiber? no, Exercise? no, Adequate hydration? no. Current over the counter/prescription laxative: miralax which has been not very effective.  Wheelchair  Does the patient require and use a wheelchair to move around in the home?  yes  Does the patient have quadriplegia or a fixed hip angle?  no  Does the patient have a trunk or brace cast or other brace that requires a reclining back feature for positioning? no  Does the patient have excessive extensor tone of the trunk muscles?  no  Does the patient need to rest in a recumbent position two or more times a day? yes  The patient has the following condition(s) that prevent a 90 degree flexion of the knee: Significant edema of the lower extremities  Does the patient have a need for arm height different than available using non-adjustable arms? no  How many hours per day does the patient usually spend in the  wheelchair? ( round up to the next hour )  1-2 .  Is the patient able to adequately self-propel  in the standard weight wheelchair?  no  If not, would the patient be able to adequately self propel in the  wheelchair being ordered?  no  The answers to the above questions were provided by:  Patient  Past Medical History:  Diagnosis Date  . Anemia    iv iron treatment dec 2014/ see oncology for this last in dec  2014  . Anxiety   . Arthritis    knee, hands  . Asthma    seasonal related/ albuterol used when uri  . CN (constipation) 09/14/2014  . [place] DISEASE, LUMBOSACRAL SPINE W/RADICULOPATHY 11/01/2009   Qualifier: Diagnosis of  By: Jorene Minors, Scott    . Depression   . Diabetes ([place])   . H/O dizziness    Hx  . Headache(784.0)    otc med prn  . High blood pressure   . HYPERLIPIDEMIA 02/13/2010   Qualifier: Diagnosis of  By: Jorene Minors, Scott    . INSOMNIA 08/24/2008   Qualifier: Diagnosis of  By: Radene Ou MD, [place]    . Morbid obesity ([place]) 05/24/2008   Qualifier: Diagnosis of  By: Radene Ou MD, [place]    . SVD (spontaneous vaginal delivery)    x 3    Past Surgical History:  Procedure Laterality Date  . ABDOMINAL HYSTERECTOMY  03/13/2014   total   . BILATERAL SALPINGECTOMY Bilateral 04/12/2014   Procedure: BILATERAL SALPINGECTOMY;  Surgeon: Woodroe Mode, MD;  Location: [facility];  Service: Gynecology;  Laterality: Bilateral;  . CARPAL TUNNEL RELEASE  08/30/2011   Procedure: CARPAL TUNNEL RELEASE;  Surgeon: Wynonia Sours, MD;  Location: [facility];  Service: Orthopedics;  Laterality: Right;  . CHOLECYSTECTOMY  10/11   lap choli  . GALLBLADDER SURGERY  04/04/2010  . HYSTEROSCOPY N/A 02/08/2014   Procedure: HYSTEROSCOPY;  Surgeon: Woodroe Mode, MD;  Location: [facility];  Service: Gynecology;  Laterality: N/A;  . HYSTEROSCOPY WITH NOVASURE N/A 02/08/2014   Procedure: ATTEMPTED NOVASURE ABLATION;  Surgeon: Woodroe Mode, MD;  Location: [facility];  Service: Gynecology;  Laterality: N/A;  . INTRAUTERINE DEVICE INSERTION     07/19/2013  . SEPTOPLASTY N/A 09/08/2013   Procedure: SEPTOPLASTY;  Surgeon: Ruby Cola, MD;  Location: [facility];  Service: ENT;  Laterality: N/A;  . SPINE SURGERY    . TONSILLECTOMY AND ADENOIDECTOMY Bilateral 09/08/2013   Procedure: TONSILLECTOMY ;  Surgeon: Ruby Cola, MD;  Location: [facility];  Service: ENT;  Laterality: Bilateral;  . TUBAL  LIGATION    . VAGINAL HYSTERECTOMY N/A 04/12/2014   Procedure: LAPAROSCOPIC ASSISTED VAGINAL HYSTERECTOMY;  Surgeon: Woodroe Mode, MD;  Location: [facility];  Service: Gynecology;  Laterality: N/A;  . WISDOM TOOTH EXTRACTION      Family History  Problem Relation Age of Onset  . Diabetes Mother   . Hypertension Father   . Heart disease Other   . Diabetes Other   . Hypertension Other   . Alcohol abuse Other   . Hypertension Brother   . Asthma Brother   . Hypertension Brother   . Asthma Brother   . COPD Maternal Grandmother   . Lung cancer Maternal Grandmother   . Asthma Paternal Grandmother   . Asthma Paternal Aunt     Social History   Socioeconomic History  . Marital status: Married    Spouse name: Not on file  . Number of children: 3  . Years of education: Not on file  . Highest education level: Not on file  Occupational  History  . Not on file  Tobacco Use  . Smoking status: Current Some Day Smoker    Packs/day: 0.25    Types: Cigarettes  . Smokeless tobacco: Never Used  . Tobacco comment: 4-5 cigarettes a day  Vaping Use  . Vaping Use: Never used  Substance and Sexual Activity  . Alcohol use: No  . Drug use: No  . Sexual activity: Not on file  Other Topics Concern  . Not on file  Social History Narrative  . Not on file   Social Determinants of Health   Financial Resource Strain:   . Difficulty of Paying Living Expenses:   Food Insecurity:   . Worried About Charity fundraiser in the Last Year:   . Arboriculturist in the Last Year:   Transportation Needs:   . Film/video editor (Medical):   Marland Kitchen Lack of Transportation (Non-Medical):   Physical Activity:   . Days of Exercise per Week:   . Minutes of Exercise per Session:   Stress:   . Feeling of Stress :   Social Connections:   . Frequency of Communication with Friends and Family:   . Frequency of Social Gatherings with Friends and Family:   . Attends Religious Services:   . Active Member of Clubs  or Organizations:   . Attends Archivist Meetings:   Marland Kitchen Marital Status:   Intimate Partner Violence:   . Fear of Current or Ex-Partner:   . Emotionally Abused:   Marland Kitchen Physically Abused:   . Sexually Abused:     Outpatient Medications Prior to Visit  Medication Sig Dispense Refill  . ACCU-CHEK FASTCLIX LANCETS MISC USE TO CHECK BLOOD SUGAR TWICE DAILY  11  . ACCU-CHEK GUIDE test strip USE 1 STRIP TWICE DAILY  11  . albuterol (PROVENTIL) (2.5 MG/3ML) 0.083% nebulizer solution Take 3 mLs (2.5 mg total) by nebulization every 6 (six) hours as needed for wheezing or shortness of breath. 150 mL 1  . albuterol (VENTOLIN HFA) 108 (90 Base) MCG/ACT inhaler Inhale 2 puffs into the lungs every 6 (six) hours as needed for wheezing or shortness of breath.    . cetirizine (ZYRTEC ALLERGY) 10 MG tablet Take 1 tablet (10 mg total) by mouth daily. 30 tablet 11  . fluticasone (FLONASE) 50 MCG/ACT nasal spray Place 2 sprays into both nostrils daily. 16 g 11  . Fluticasone-Salmeterol (ADVAIR DISKUS) 250-50 MCG/DOSE AEPB Inhale 1 puff into the lungs 2 (two) times daily. 60 each 11  . isosorbide-hydrALAZINE (BIDIL) 20-37.5 MG tablet Take 1 tablet by mouth in the morning and at bedtime. 180 tablet 2  . lidocaine (LIDODERM) 5 % Place 1 patch onto the skin daily as needed (pain).   5  . montelukast (SINGULAIR) 10 MG tablet Take 1 tablet (10 mg total) by mouth at bedtime. 30 tablet 11  . omeprazole (PRILOSEC) 20 MG capsule Take 1 capsule (20 mg total) by mouth daily. As needed 30 capsule 11  . ondansetron (ZOFRAN) 4 MG tablet Take 4 mg by mouth every 8 (eight) hours as needed for nausea or vomiting.    . Oxycodone HCl 10 MG TABS Take 10 mg by mouth every 6 (six) hours as needed (pain). Taking 3-4 times a day    . QUEtiapine Fumarate (SEROQUEL PO) Take 400 mg by mouth at bedtime. Taking for sleep    . spironolactone (ALDACTONE) 50 MG tablet Take 1 tablet (50 mg total) by mouth daily. 90 tablet 3  .  gabapentin  (NEURONTIN) 600 MG tablet Take 1 tablet (600 mg total) by mouth 3 (three) times daily. (Patient taking differently: Take 600 mg by mouth 3 (three) times daily. Taking once daily) 180 tablet 0  . carbamazepine (EQUETRO) 200 MG CP12 12 hr capsule Take 200 mg by mouth daily. (Patient not taking: Reported on 12/02/2019)    . fluticasone (FLOVENT HFA) 110 MCG/ACT inhaler Inhale 1 puff into the lungs 2 (two) times daily. (Patient not taking: Reported on 01/17/2020) 1 Inhaler 12  . ibuprofen (ADVIL,MOTRIN) 800 MG tablet Take 800 mg by mouth every 8 (eight) hours as needed for moderate pain. (Patient not taking: Reported on 01/17/2020)    . XTAMPZA ER 13.5 MG C12A Take 1 capsule by mouth every 12 (twelve) hours. (Patient not taking: Reported on 01/14/2020)     No facility-administered medications prior to visit.    No Known Allergies  ROS Review of Systems    Objective:    Physical Exam Constitutional:      General: She is not in acute distress.    Appearance: She is obese. She is not ill-appearing or toxic-appearing.  HENT:     Head: Normocephalic and atraumatic.     Nose: Nose normal.     Mouth/Throat:     Mouth: Mucous membranes are moist.  Cardiovascular:     Rate and Rhythm: Normal rate and regular rhythm.     Pulses: Normal pulses.  Pulmonary:     Effort: Pulmonary effort is normal.     Breath sounds: Normal breath sounds.  Abdominal:     General: Bowel sounds are normal.     Palpations: Abdomen is soft.  Musculoskeletal:        General: Swelling and tenderness present.     Cervical back: Normal range of motion.     Right lower leg: No edema.     Left lower leg: No edema.     Comments: WC in use  Skin:    Comments: Dry and scaly  Neurological:     General: No focal deficit present.     Mental Status: She is alert and oriented to person, place, and time.  Psychiatric:        Mood and Affect: Mood normal.        Behavior: Behavior normal.        Thought Content: Thought  content normal.        Judgment: Judgment normal.     BP 120/75 (BP Location: Left Arm, Patient Position: Sitting, Cuff Size: Large)   Temp 98.3 F (36.8 C) (Oral)   Resp 16   Ht 5\' 5"  (1.651 m)   LMP 02/13/2014   SpO2 99%   BMI 73.55 kg/m  Wt Readings from Last 3 Encounters:  01/14/20 (!) 442 lb (200.5 kg)  12/22/19 (!) 443 lb (200.9 kg)  12/09/19 (!) 443 lb 0.8 oz (201 kg)     Health Maintenance Due  Topic Date Due  . Hepatitis C Screening  Never done  . COVID-19 Vaccine (1) Never done  . URINE MICROALBUMIN  08/03/2011    There are no preventive care reminders to display for this patient.  Lab Results  Component Value Date   TSH 1.895 04/07/2013   Lab Results  Component Value Date   WBC 9.2 12/07/2019   HGB 15.0 12/07/2019   HCT 45.7 12/07/2019   MCV 77.9 (L) 12/07/2019   PLT 254.0 12/07/2019   Lab Results  Component Value Date   NA 140  01/17/2020   K 4.6 01/17/2020   CHLORIDE 107 04/07/2013   CO2 28 08/17/2019   GLUCOSE 111 (H) 01/17/2020   BUN 12 01/17/2020   CREATININE 0.80 01/17/2020   BILITOT 0.3 01/17/2020   ALKPHOS 87 01/17/2020   AST 20 01/17/2020   ALT 20 07/08/2019   PROT 7.8 01/17/2020   ALBUMIN 3.9 01/17/2020   CALCIUM 9.4 01/17/2020   ANIONGAP 9 07/08/2019   Lab Results  Component Value Date   CHOL 188 08/17/2010   Lab Results  Component Value Date   HDL 43 08/17/2010   Lab Results  Component Value Date   LDLCALC 127 (H) 08/17/2010   Lab Results  Component Value Date   TRIG 91 08/17/2010   Lab Results  Component Value Date   CHOLHDL 4.4 Ratio 08/17/2010   Lab Results  Component Value Date   HGBA1C 6.7 (A) 10/18/2019   HGBA1C 6.7 10/18/2019   HGBA1C 6.7 (A) 10/18/2019   HGBA1C 6.7 10/18/2019      Assessment & Plan:   Problem List Items Addressed This Visit      Musculoskeletal and Integument   DEGENERATIVE DISC DISEASE, LUMBOSACRAL SPINE W/RADICULOPATHY   Relevant Medications   gabapentin (NEURONTIN) 600 MG  tablet   Other Relevant Orders   DME Wheelchair electric   Sedimentation rate (Completed)   Ambulatory referral to Pain Clinic    Other Visit Diagnoses    Weakness of both lower extremities    -  Primary   Relevant Orders   DME Wheelchair electric Encourage chair exercises.   Right leg pain       Relevant Medications   gabapentin (NEURONTIN) 600 MG tablet   Other Relevant Orders   DME Wheelchair electric   Sedimentation rate (Completed)   Morbid obesity with BMI of 70 and over, adult ([place])     Encourage compliance with current treatment regimen  Lifestyle modification with healthy diet (fewer calories, more high fiber foods, whole grains and non-starchy vegetables, lower fat meat and fish, low-fat diary include healthy oils) regular exercise (physical activity) and weight loss Nutritional consult recommended    Relevant Orders   DME Wheelchair electric   Comp. Metabolic Panel (12) (Completed)   Poor tolerance for ambulation     Reapply for wheelchair to assist patient with quality of life to help perform some ADL's like cooking and shopping.       Meds ordered this encounter  Medications  . DISCONTD: ibuprofen (ADVIL) 800 MG tablet    Sig: Take 1 tablet (800 mg total) by mouth every 8 (eight) hours as needed for moderate pain.    Dispense:  30 tablet    Refill:  2    Order Specific Question:   Supervising Provider    Answer:   Tresa Garter [id number]  . gabapentin (NEURONTIN) 600 MG tablet    Sig: Take 1 tablet (600 mg total) by mouth 3 (three) times daily.    Dispense:  180 tablet    Refill:  0    Order Specific Question:   Supervising Provider    Answer:   Tresa Garter [id number]  . ibuprofen (ADVIL) 800 MG tablet    Sig: Take 1 tablet (800 mg total) by mouth every 8 (eight) hours as needed for moderate pain. Take on full stomach.    Dispense:  90 tablet    Refill:  2    Order Specific Question:   Supervising Provider    Answer:   Angelica Chessman  E  [[id number]]  . linaclotide (LINZESS) 290 MCG CAPS capsule    Sig: Take 1 capsule (290 mcg total) by mouth daily before breakfast.    Dispense:  90 capsule    Refill:  3    Order Specific Question:   Supervising Provider    Answer:   Tresa Garter [[id number]]    Follow-up: Return in about 3 months (around 04/18/2020).    [name], NP

## 2020-01-17 NOTE — Patient Instructions (Signed)
Nausea, Adult Nausea is feeling sick to your stomach or feeling that you are about to throw up (vomit). Feeling sick to your stomach is usually not serious, but it may be an early sign of a more serious medical problem. As you feel sicker to your stomach, you may throw up. If you throw up, or if you are not able to drink enough fluids, there is a risk that you may lose too much water in your body (get dehydrated). If you lose too much water in your body, you may:  Feel tired.  Feel thirsty.  Have a dry mouth.  Have cracked lips.  Go pee (urinate) less often. Older adults and people who have other diseases or a weak body defense system (immune system) have a higher risk of losing too much water in the body. The main goals of treating this condition are:  To relieve your nausea.  To ensure your nausea occurs less often.  To prevent throwing up and losing too much fluid. Follow these instructions at home: Watch your symptoms for any changes. Tell your doctor about them. Follow these instructions as told by your doctor. Eating and drinking      Take an ORS (oral rehydration solution). This is a drink that is sold at pharmacies and stores.  Drink clear fluids in small amounts as you are able. These include: ? Water. ? Ice chips. ? Fruit juice that has water added (diluted fruit juice). ? Low-calorie sports drinks.  Eat bland, easy-to-digest foods in small amounts as you are able, such as: ? Bananas. ? Applesauce. ? Rice. ? Low-fat (lean) meats. ? Toast. ? Crackers.  Avoid drinking fluids that have a lot of sugar or caffeine in them. This includes energy drinks, sports drinks, and soda.  Avoid alcohol.  Avoid spicy or fatty foods. General instructions  Take over-the-counter and prescription medicines only as told by your doctor.  Rest at home while you get better.  Drink enough fluid to keep your pee (urine) pale yellow.  Take slow and deep breaths when you feel  sick to your stomach.  Avoid food or things that have strong smells.  Wash your hands often with soap and water. If you cannot use soap and water, use hand sanitizer.  Make sure that all people in your home wash their hands well and often.  Keep all follow-up visits as told by your doctor. This is important. Contact a doctor if:  You feel sicker to your stomach.  You feel sick to your stomach for more than 2 days.  You throw up.  You are not able to drink fluids without throwing up.  You have new symptoms.  You have a fever.  You have a headache.  You have muscle cramps.  You have a rash.  You have pain while peeing.  You feel light-headed or dizzy. Get help right away if:  You have pain in your chest, neck, arm, or jaw.  You feel very weak or you pass out (faint).  You have throw up that is bright red or looks like coffee grounds.  You have bloody or black poop (stools) or poop that looks like tar.  You have a very bad headache, a stiff neck, or both.  You have very bad pain, cramping, or bloating in your belly (abdomen).  You have trouble breathing or you are breathing very quickly.  Your heart is beating very quickly.  Your skin feels cold and clammy.  You feel confused.    You have signs of losing too much water in your body, such as: ? Dark pee, very little pee, or no pee. ? Cracked lips. ? Dry mouth. ? Sunken eyes. ? Sleepiness. ? Weakness. These symptoms may be an emergency. Do not wait to see if the symptoms will go away. Get medical help right away. Call your local emergency services (911 in the [place] Do not drive yourself to the hospital. Summary  Nausea is feeling sick to your stomach or feeling that you are about to throw up (vomit).  If you throw up, or if you are not able to drink enough fluids, there is a risk that you may lose too much water in your body (get dehydrated).  Eat and drink what your doctor tells you. Take  over-the-counter and prescription medicines only as told by your doctor.  Contact a doctor right away if your symptoms get worse or you have new symptoms.  Keep all follow-up visits as told by your doctor. This is important. This information is not intended to replace advice given to you by your health care provider. Make sure you discuss any questions you have with your health care provider. Document Revised: 11/18/2017 Document Reviewed: 11/18/2017 Elsevier Patient Education  [place]. Constipation, Adult Constipation is when a person:  Poops (has a bowel movement) fewer times in a week than normal.  Has a hard time pooping.  Has poop that is dry, hard, or bigger than normal. Follow these instructions at home: Eating and drinking   Eat foods that have a lot of fiber, such as: ? Fresh fruits and vegetables. ? Whole grains. ? Beans.  Eat less of foods that are high in fat, low in fiber, or overly processed, such as: ? [place] fries. ? Hamburgers. ? Cookies. ? Candy. ? Soda.  Drink enough fluid to keep your pee (urine) clear or pale yellow. General instructions  Exercise regularly or as told by your doctor.  Go to the restroom when you feel like you need to poop. Do not hold it in.  Take over-the-counter and prescription medicines only as told by your doctor. These include any fiber supplements.  Do pelvic floor retraining exercises, such as: ? Doing deep breathing while relaxing your lower belly (abdomen). ? Relaxing your pelvic floor while pooping.  Watch your condition for any changes.  Keep all follow-up visits as told by your doctor. This is important. Contact a doctor if:  You have pain that gets worse.  You have a fever.  You have not pooped for 4 days.  You throw up (vomit).  You are not hungry.  You lose weight.  You are bleeding from the anus.  You have thin, pencil-like poop (stool). Get help right away if:  You have a fever, and  your symptoms suddenly get worse.  You leak poop or have blood in your poop.  Your belly feels hard or bigger than normal (is bloated).  You have very bad belly pain.  You feel dizzy or you faint. This information is not intended to replace advice given to you by your health care provider. Make sure you discuss any questions you have with your health care provider. Document Revised: 05/23/2017 Document Reviewed: 11/29/2015 Elsevier Patient Education  2020 [employer].

## 2020-01-18 ENCOUNTER — Telehealth: Payer: Self-pay

## 2020-01-18 LAB — COMP. METABOLIC PANEL (12)
AST: 20 IU/L (ref 0–40)
Albumin/Globulin Ratio: 1 — ABNORMAL LOW (ref 1.2–2.2)
Albumin: 3.9 g/dL (ref 3.8–4.8)
Alkaline Phosphatase: 87 IU/L (ref 48–121)
BUN/Creatinine Ratio: 15 (ref 9–23)
BUN: 12 mg/dL (ref 6–24)
Bilirubin Total: 0.3 mg/dL (ref 0.0–1.2)
Calcium: 9.4 mg/dL (ref 8.7–10.2)
Chloride: 98 mmol/L (ref 96–106)
Creatinine, Ser: 0.8 mg/dL (ref 0.57–1.00)
GFR calc Af Amer: 100 mL/min/{1.73_m2} (ref >59)
GFR calc non Af Amer: 87 mL/min/{1.73_m2} (ref >59)
Globulin, Total: 3.9 g/dL (ref 1.5–4.5)
Glucose: 111 mg/dL — ABNORMAL HIGH (ref 65–99)
Potassium: 4.6 mmol/L (ref 3.5–5.2)
Sodium: 140 mmol/L (ref 134–144)
Total Protein: 7.8 g/dL (ref 6.0–8.5)

## 2020-01-18 LAB — SEDIMENTATION RATE: Sed Rate: 91 mm/hr — ABNORMAL HIGH (ref 0–32)

## 2020-01-18 NOTE — Telephone Encounter (Signed)
RX for power wheelchair and Notes regarding wheelchair faxed into [employer] today 01/18/20 @9 :27am. Thanks!

## 2020-02-07 ENCOUNTER — Ambulatory Visit: Payer: Medicaid Other | Admitting: Critical Care Medicine

## 2020-02-07 NOTE — Progress Notes (Deleted)
Synopsis: Referred in June 2021 for SOB by [name], NP.  Subjective:   PATIENT ID: Cynthia Becker GENDER: female DOB: 11-07-1970, MRN: [id number]  No chief complaint on file.   HPI  Allergies  Concern for allergic asthma--needs PFTs  Sleep study-- needs 1L O2 at night, insufficient AHI (3.1) to need PAP  OV 12/02/19: Cynthia Becker is a 49 y/o woman referred for evaluation of SOB and chronic cough.  She is wheezing with significant exertion.  She presents today with her husband. She has had chronic shortness of breath and cough for many years, with progressive symptoms in the past 2 months.  She has a history of asthma and is currently prescribed Flovent, which she has been taking only once daily.  She uses albuterol every day.  She has chronic allergies but did not have benefit from Claritin and stopped taking it.  She takes Flonase intermittently, but notices this causes worse postnasal drip and productive cough.  Her main complaint is feeling like she has fluid in her chest that she has to cough up.  Her sputum is clear.  She sleeps propped totally upright, but still wakes up feeling like she cannot "spit stuff out" fast enough.  She has no rhinorrhea.  She has had hoarseness and ear fullness, but no sneezing, itchy nose, watery eyes.  She saw an allergist in the past and had allergy testing; she has year-round allergies.  Her family history is notable for COPD and lung cancer in her maternal grandmother.  She smokes 3 to 4 cigarettes/day due to stress; she began smoking 6 years ago but has always smoked less than 1 pack/day.  She has chronic reflux, but is no longer taking a PPI.  She gets heartburn about 1 day/week.  She wakes up with water brash in the morning.  She drinks some caffeine, but frequently drinks soda.  No mints.  She eats late in the evening.  She had a complicated surgery several years ago requiring subsequent back surgery, and since then she has gained 200 pounds in the  past 5 years.  Her cardiologist is concerned she has developed obesity hypoventilation syndrome and sleep apnea.  She has chronic edema, daytime fatigue, and hypertension.  She has frequent nighttime awakenings.  Past Medical History:  Diagnosis Date  . Anemia    iv iron treatment dec 2014/ see oncology for this last in dec 2014  . Anxiety   . Arthritis    knee, hands  . Asthma    seasonal related/ albuterol used when uri  . CN (constipation) 09/14/2014  . [place] DISEASE, LUMBOSACRAL SPINE W/RADICULOPATHY 11/01/2009   Qualifier: Diagnosis of  By: Jorene Minors, [place]    . Depression   . Diabetes ([place])   . H/O dizziness    Hx  . Headache(784.0)    otc med prn  . High blood pressure   . HYPERLIPIDEMIA 02/13/2010   Qualifier: Diagnosis of  By: Jorene Minors, Scott    . INSOMNIA 08/24/2008   Qualifier: Diagnosis of  By: Radene Ou MD, [place]    . Morbid obesity ([place]) 05/24/2008   Qualifier: Diagnosis of  By: Radene Ou MD, [place]    . SVD (spontaneous vaginal delivery)    x 3     Family History  Problem Relation Age of Onset  . Diabetes Mother   . Hypertension Father   . Heart disease Other   . Diabetes Other   . Hypertension Other   . Alcohol abuse  Other   . Hypertension Brother   . Asthma Brother   . Hypertension Brother   . Asthma Brother   . COPD Maternal Grandmother   . Lung cancer Maternal Grandmother   . Asthma Paternal Grandmother   . Asthma Paternal Aunt      Past Surgical History:  Procedure Laterality Date  . ABDOMINAL HYSTERECTOMY  03/13/2014   total   . BILATERAL SALPINGECTOMY Bilateral 04/12/2014   Procedure: BILATERAL SALPINGECTOMY;  Surgeon: Woodroe Mode, MD;  Location: [facility];  Service: Gynecology;  Laterality: Bilateral;  . CARPAL TUNNEL RELEASE  08/30/2011   Procedure: CARPAL TUNNEL RELEASE;  Surgeon: Wynonia Sours, MD;  Location: [facility];  Service: Orthopedics;  Laterality: Right;  . CHOLECYSTECTOMY  10/11   lap choli  .  GALLBLADDER SURGERY  04/04/2010  . HYSTEROSCOPY N/A 02/08/2014   Procedure: HYSTEROSCOPY;  Surgeon: Woodroe Mode, MD;  Location: [facility];  Service: Gynecology;  Laterality: N/A;  . HYSTEROSCOPY WITH NOVASURE N/A 02/08/2014   Procedure: ATTEMPTED NOVASURE ABLATION;  Surgeon: Woodroe Mode, MD;  Location: [facility];  Service: Gynecology;  Laterality: N/A;  . INTRAUTERINE DEVICE INSERTION     07/19/2013  . SEPTOPLASTY N/A 09/08/2013   Procedure: SEPTOPLASTY;  Surgeon: Ruby Cola, MD;  Location: [facility];  Service: ENT;  Laterality: N/A;  . SPINE SURGERY    . TONSILLECTOMY AND ADENOIDECTOMY Bilateral 09/08/2013   Procedure: TONSILLECTOMY ;  Surgeon: Ruby Cola, MD;  Location: [facility];  Service: ENT;  Laterality: Bilateral;  . TUBAL LIGATION    . VAGINAL HYSTERECTOMY N/A 04/12/2014   Procedure: LAPAROSCOPIC ASSISTED VAGINAL HYSTERECTOMY;  Surgeon: Woodroe Mode, MD;  Location: [facility];  Service: Gynecology;  Laterality: N/A;  . WISDOM TOOTH EXTRACTION      Social History   Socioeconomic History  . Marital status: Married    Spouse name: Not on file  . Number of children: 3  . Years of education: Not on file  . Highest education level: Not on file  Occupational History  . Not on file  Tobacco Use  . Smoking status: Current Some Day Smoker    Packs/day: 0.25    Types: Cigarettes  . Smokeless tobacco: Never Used  . Tobacco comment: 4-5 cigarettes a day  Vaping Use  . Vaping Use: Never used  Substance and Sexual Activity  . Alcohol use: No  . Drug use: No  . Sexual activity: Not on file  Other Topics Concern  . Not on file  Social History Narrative  . Not on file   Social Determinants of Health   Financial Resource Strain:   . Difficulty of Paying Living Expenses:   Food Insecurity:   . Worried About Charity fundraiser in the Last Year:   . Arboriculturist in the Last Year:   Transportation Needs:   . Film/video editor (Medical):   Marland Kitchen Lack of Transportation (Non-Medical):    Physical Activity:   . Days of Exercise per Week:   . Minutes of Exercise per Session:   Stress:   . Feeling of Stress :   Social Connections:   . Frequency of Communication with Friends and Family:   . Frequency of Social Gatherings with Friends and Family:   . Attends Religious Services:   . Active Member of Clubs or Organizations:   . Attends Archivist Meetings:   Marland Kitchen Marital Status:   Intimate Partner Violence:   . Fear of  Current or Ex-Partner:   . Emotionally Abused:   Marland Kitchen Physically Abused:   . Sexually Abused:      No Known Allergies   Immunization History  Administered Date(s) Administered  . Influenza Whole 04/25/2010  . Influenza,inj,Quad PF,6+ Mos 03/07/2014, 03/10/2019  . Pneumococcal Polysaccharide-23 04/13/2014  . Td 08/02/2010    Outpatient Medications Prior to Visit  Medication Sig Dispense Refill  . ACCU-CHEK FASTCLIX LANCETS MISC USE TO CHECK BLOOD SUGAR TWICE DAILY  11  . ACCU-CHEK GUIDE test strip USE 1 STRIP TWICE DAILY  11  . albuterol (PROVENTIL) (2.5 MG/3ML) 0.083% nebulizer solution Take 3 mLs (2.5 mg total) by nebulization every 6 (six) hours as needed for wheezing or shortness of breath. 150 mL 1  . albuterol (VENTOLIN HFA) 108 (90 Base) MCG/ACT inhaler Inhale 2 puffs into the lungs every 6 (six) hours as needed for wheezing or shortness of breath.    . cetirizine (ZYRTEC ALLERGY) 10 MG tablet Take 1 tablet (10 mg total) by mouth daily. 30 tablet 11  . fluticasone (FLONASE) 50 MCG/ACT nasal spray Place 2 sprays into both nostrils daily. 16 g 11  . Fluticasone-Salmeterol (ADVAIR DISKUS) 250-50 MCG/DOSE AEPB Inhale 1 puff into the lungs 2 (two) times daily. 60 each 11  . gabapentin (NEURONTIN) 600 MG tablet Take 1 tablet (600 mg total) by mouth 3 (three) times daily. 180 tablet 0  . ibuprofen (ADVIL) 800 MG tablet Take 1 tablet (800 mg total) by mouth every 8 (eight) hours as needed for moderate pain. Take on full stomach. 90 tablet 2  .  isosorbide-hydrALAZINE (BIDIL) 20-37.5 MG tablet Take 1 tablet by mouth in the morning and at bedtime. 180 tablet 2  . lidocaine (LIDODERM) 5 % Place 1 patch onto the skin daily as needed (pain).   5  . linaclotide (LINZESS) 290 MCG CAPS capsule Take 1 capsule (290 mcg total) by mouth daily before breakfast. 90 capsule 3  . montelukast (SINGULAIR) 10 MG tablet Take 1 tablet (10 mg total) by mouth at bedtime. 30 tablet 11  . omeprazole (PRILOSEC) 20 MG capsule Take 1 capsule (20 mg total) by mouth daily. As needed 30 capsule 11  . ondansetron (ZOFRAN) 4 MG tablet Take 4 mg by mouth every 8 (eight) hours as needed for nausea or vomiting.    . Oxycodone HCl 10 MG TABS Take 10 mg by mouth every 6 (six) hours as needed (pain). Taking 3-4 times a day    . QUEtiapine Fumarate (SEROQUEL PO) Take 400 mg by mouth at bedtime. Taking for sleep    . spironolactone (ALDACTONE) 50 MG tablet Take 1 tablet (50 mg total) by mouth daily. 90 tablet 3   No facility-administered medications prior to visit.    Review of Systems  Constitutional: Negative for chills and fever.  HENT: Positive for congestion and sore throat.        Postnasal drip  Respiratory: Positive for cough, sputum production, shortness of breath and wheezing.   Cardiovascular: Positive for leg swelling. Negative for chest pain.  Gastrointestinal: Positive for heartburn. Negative for nausea and vomiting.  Musculoskeletal: Positive for back pain.  Endo/Heme/Allergies: Positive for environmental allergies.     Objective:   There were no vitals filed for this visit.   on   RA BMI Readings from Last 3 Encounters:  01/17/20 73.55 kg/m  01/14/20 73.55 kg/m  12/22/19 73.72 kg/m   Wt Readings from Last 3 Encounters:  01/14/20 (!) 442 lb (200.5 kg)  12/22/19 Marland Kitchen)  443 lb (200.9 kg)  12/09/19 (!) 443 lb 0.8 oz (201 kg)    Physical Exam   CBC    Component Value Date/Time   WBC 9.2 12/07/2019 1535   RBC 5.86 (H) 12/07/2019 1535   HGB  15.0 12/07/2019 1535   HGB 17.3 (H) 10/18/2019 1600   HGB 13.3 09/12/2014 1512   HCT 45.7 12/07/2019 1535   HCT 52.7 (H) 10/18/2019 1600   HCT 42.4 09/12/2014 1512   PLT 254.0 12/07/2019 1535   PLT 290 10/18/2019 1600   MCV 77.9 (L) 12/07/2019 1535   MCV 74 (L) 10/18/2019 1600   MCV 64.6 (L) 09/12/2014 1512   MCH 24.3 (L) 10/18/2019 1600   MCH 22.9 (L) 07/08/2019 2054   MCHC 32.8 12/07/2019 1535   RDW 18.3 (H) 12/07/2019 1535   RDW 20.5 (H) 10/18/2019 1600   RDW 21.6 (H) 09/12/2014 1512   LYMPHSABS 2.4 12/07/2019 1535   LYMPHSABS 1.9 10/18/2019 1600   LYMPHSABS 2.4 09/12/2014 1512   MONOABS 0.7 12/07/2019 1535   MONOABS 0.6 09/12/2014 1512   EOSABS 0.4 12/07/2019 1535   EOSABS 0.2 10/18/2019 1600   BASOSABS 0.1 12/07/2019 1535   BASOSABS 0.0 10/18/2019 1600   BASOSABS 0.1 09/12/2014 1512    CHEMISTRY No results for input(s): NA, K, CL, CO2, GLUCOSE, BUN, CREATININE, CALCIUM, MG, PHOS in the last 168 hours. CrCl cannot be calculated (Unknown ideal weight.).   Chest Imaging- films reviewed: CXR 07/08/2019-low lung volumes with basilar atelectasis, cardiomegaly.  Poorly penetrated film.  Pulmonary Functions Testing Results: No flowsheet data found.   Echocardiogram 08/18/2019:  Study Quality: Technically Difficult  Normal LV systolic function with visual EF 50-55%. Left ventricle cavity  is normal in size. Normal global wall motion. Doppler evidence of grade II  diastolic dysfunction, elevated LAP. No obvious regional wall motion  abnormalities. Moderate left ventricular hypertrophy.  Left atrial cavity is moderately dilated.  No significant valvular abnormalities.  IVC is dilated with a respiratory response of >50%.  Prior study dated 03/2017: Mild concentric remodeling of the left  ventricle. Mild decrease in global wall motion. Visual EF is 45-50%. Grade  I diastolic dysfunction, normal LAP. Left atrial cavity is mildly dilated.      Assessment & Plan:   No  diagnosis found.   Allergic rhinosinusitis -Start Claritin and Singulair once daily -Resume Flonase once daily -Allergy avoidance  Dyspnea and chronic cough-likely allergic asthma. -PFTs -Start Advair twice daily.  Reinforced the importance of compliance.  Rinse her mouth after every use. -Albuterol as needed -Recommend Covid, pneumonia, yearly flu vaccines -Optimize GERD and rhinosinusitis management -Check IgE and CBC with differential  GERD -Restart PPI once daily. -Discussed dietary modifications. -Long-term weight loss will be an important part of her management  Morbid obesity; concern for OSA and OHS -Agree with cardiology that bariatric surgery evaluation is appropriate -Recommend modest weight loss as a long-term goal -Split-night PSG -PFTs with ABG to evaluate for hypercapnia.    RTC in 2 months after PFTs.  > 1 hour spent on this encounter including chart review, time spent face-to-face with the patient, and documentation.   Current Outpatient Medications:  .  ACCU-CHEK FASTCLIX LANCETS MISC, USE TO CHECK BLOOD SUGAR TWICE DAILY, Disp: , Rfl: 11 .  ACCU-CHEK GUIDE test strip, USE 1 STRIP TWICE DAILY, Disp: , Rfl: 11 .  albuterol (PROVENTIL) (2.5 MG/3ML) 0.083% nebulizer solution, Take 3 mLs (2.5 mg total) by nebulization every 6 (six) hours as needed for wheezing or  shortness of breath., Disp: 150 mL, Rfl: 1 .  albuterol (VENTOLIN HFA) 108 (90 Base) MCG/ACT inhaler, Inhale 2 puffs into the lungs every 6 (six) hours as needed for wheezing or shortness of breath., Disp: , Rfl:  .  cetirizine (ZYRTEC ALLERGY) 10 MG tablet, Take 1 tablet (10 mg total) by mouth daily., Disp: 30 tablet, Rfl: 11 .  fluticasone (FLONASE) 50 MCG/ACT nasal spray, Place 2 sprays into both nostrils daily., Disp: 16 g, Rfl: 11 .  Fluticasone-Salmeterol (ADVAIR DISKUS) 250-50 MCG/DOSE AEPB, Inhale 1 puff into the lungs 2 (two) times daily., Disp: 60 each, Rfl: 11 .  gabapentin (NEURONTIN) 600  MG tablet, Take 1 tablet (600 mg total) by mouth 3 (three) times daily., Disp: 180 tablet, Rfl: 0 .  ibuprofen (ADVIL) 800 MG tablet, Take 1 tablet (800 mg total) by mouth every 8 (eight) hours as needed for moderate pain. Take on full stomach., Disp: 90 tablet, Rfl: 2 .  isosorbide-hydrALAZINE (BIDIL) 20-37.5 MG tablet, Take 1 tablet by mouth in the morning and at bedtime., Disp: 180 tablet, Rfl: 2 .  lidocaine (LIDODERM) 5 %, Place 1 patch onto the skin daily as needed (pain). , Disp: , Rfl: 5 .  linaclotide (LINZESS) 290 MCG CAPS capsule, Take 1 capsule (290 mcg total) by mouth daily before breakfast., Disp: 90 capsule, Rfl: 3 .  montelukast (SINGULAIR) 10 MG tablet, Take 1 tablet (10 mg total) by mouth at bedtime., Disp: 30 tablet, Rfl: 11 .  omeprazole (PRILOSEC) 20 MG capsule, Take 1 capsule (20 mg total) by mouth daily. As needed, Disp: 30 capsule, Rfl: 11 .  ondansetron (ZOFRAN) 4 MG tablet, Take 4 mg by mouth every 8 (eight) hours as needed for nausea or vomiting., Disp: , Rfl:  .  Oxycodone HCl 10 MG TABS, Take 10 mg by mouth every 6 (six) hours as needed (pain). Taking 3-4 times a day, Disp: , Rfl:  .  QUEtiapine Fumarate (SEROQUEL PO), Take 400 mg by mouth at bedtime. Taking for sleep, Disp: , Rfl:  .  spironolactone (ALDACTONE) 50 MG tablet, Take 1 tablet (50 mg total) by mouth daily., Disp: 90 tablet, Rfl: 3     Julian Hy, DO [facility] 02/07/2020 8:30 AM

## 2020-02-17 ENCOUNTER — Ambulatory Visit: Payer: Medicaid Other | Admitting: Adult Health

## 2020-02-22 ENCOUNTER — Other Ambulatory Visit: Payer: Self-pay

## 2020-02-22 ENCOUNTER — Encounter: Payer: Self-pay | Admitting: Critical Care Medicine

## 2020-02-22 ENCOUNTER — Ambulatory Visit: Payer: Medicaid Other | Admitting: Critical Care Medicine

## 2020-02-22 VITALS — BP 116/82 | HR 97 | Temp 97.2°F | Ht 65.0 in | Wt >= 6400 oz

## 2020-02-22 DIAGNOSIS — R0609 Other forms of dyspnea: Secondary | ICD-10-CM

## 2020-02-22 DIAGNOSIS — R0600 Dyspnea, unspecified: Secondary | ICD-10-CM

## 2020-02-22 DIAGNOSIS — B9689 Other specified bacterial agents as the cause of diseases classified elsewhere: Secondary | ICD-10-CM

## 2020-02-22 DIAGNOSIS — G4734 Idiopathic sleep related nonobstructive alveolar hypoventilation: Secondary | ICD-10-CM

## 2020-02-22 DIAGNOSIS — R040 Epistaxis: Secondary | ICD-10-CM | POA: Diagnosis not present

## 2020-02-22 DIAGNOSIS — J019 Acute sinusitis, unspecified: Secondary | ICD-10-CM | POA: Diagnosis not present

## 2020-02-22 MED ORDER — AMOXICILLIN-POT CLAVULANATE 875-125 MG PO TABS: 1.0000 | ORAL_TABLET | Freq: Two times a day (BID) | ORAL | 0 refills | Status: DC

## 2020-02-22 NOTE — Progress Notes (Signed)
Synopsis: Referred in June 2021 for SOB by [name], NP.  Subjective:   PATIENT ID: Cynthia Becker GENDER: female DOB: Jul 13, 1970, MRN: [id number]  No chief complaint on file.   Cynthia Becker is a 49 y/o woman who presents with her husband today for follow up. She complains of having epistaxis episodes on the R for the past few weeks and ongoing sinus issues with sinus pressure and pain, significant post-nasal drip with too much phlegm to be able to cough up, ear fullness, and hoarseness. She feels like her face is sometimes puffy from the congestion. She has remained on her asthma and allergy medications but has not tried antibiotics for these symptoms over the past few months. She has noticed an improvement in her breathing since starting on Advair BID and montelukast. She seldom gets chest tightness and SOB now. She continues to use her nocturnal 2-3L O2. She has been using omeprazole with improvement in SOB; she denies heartburn. Has not done PFTs due to feeling poorly.    OV 12/02/19: Cynthia Becker is a 49 y/o woman referred for evaluation of SOB and chronic cough.  She is wheezing with significant exertion.  She presents today with her husband. She has had chronic shortness of breath and cough for many years, with progressive symptoms in the past 2 months.  She has a history of asthma and is currently prescribed Flovent, which she has been taking only once daily.  She uses albuterol every day.  She has chronic allergies but did not have benefit from Claritin and stopped taking it.  She takes Flonase intermittently, but notices this causes worse postnasal drip and productive cough.  Her main complaint is feeling like she has fluid in her chest that she has to cough up.  Her sputum is clear.  She sleeps propped totally upright, but still wakes up feeling like she cannot "spit stuff out" fast enough.  She has no rhinorrhea.  She has had hoarseness and ear fullness, but no sneezing, itchy nose,  watery eyes.  She saw an allergist in the past and had allergy testing; she has year-round allergies.  Her family history is notable for COPD and lung cancer in her maternal grandmother.  She smokes 3 to 4 cigarettes/day due to stress; she began smoking 6 years ago but has always smoked less than 1 pack/day.  She has chronic reflux, but is no longer taking a PPI.  She gets heartburn about 1 day/week.  She wakes up with water brash in the morning.  She drinks some caffeine, but frequently drinks soda.  No mints.  She eats late in the evening.  She had a complicated surgery several years ago requiring subsequent back surgery, and since then she has gained 200 pounds in the past 5 years.  Her cardiologist is concerned she has developed obesity hypoventilation syndrome and sleep apnea.  She has chronic edema, daytime fatigue, and hypertension.  She has frequent nighttime awakenings.  Past Medical History:  Diagnosis Date  . Anemia    iv iron treatment dec 2014/ see oncology for this last in dec 2014  . Anxiety   . Arthritis    knee, hands  . Asthma    seasonal related/ albuterol used when uri  . CN (constipation) 09/14/2014  . [place] DISEASE, LUMBOSACRAL SPINE W/RADICULOPATHY 11/01/2009   Qualifier: Diagnosis of  By: Jorene Minors, [place]    . Depression   . Diabetes ([place])   . H/O dizziness  Hx  . Headache(784.0)    otc med prn  . High blood pressure   . HYPERLIPIDEMIA 02/13/2010   Qualifier: Diagnosis of  By: Jorene Minors, Scott    . INSOMNIA 08/24/2008   Qualifier: Diagnosis of  By: Radene Ou MD, [place]    . Morbid obesity ([place]) 05/24/2008   Qualifier: Diagnosis of  By: Radene Ou MD, [place]    . SVD (spontaneous vaginal delivery)    x 3     Family History  Problem Relation Age of Onset  . Diabetes Mother   . Hypertension Father   . Heart disease Other   . Diabetes Other   . Hypertension Other   . Alcohol abuse Other   . Hypertension Brother   . Asthma Brother   .  Hypertension Brother   . Asthma Brother   . COPD Maternal Grandmother   . Lung cancer Maternal Grandmother   . Asthma Paternal Grandmother   . Asthma Paternal Aunt      Past Surgical History:  Procedure Laterality Date  . ABDOMINAL HYSTERECTOMY  03/13/2014   total   . BILATERAL SALPINGECTOMY Bilateral 04/12/2014   Procedure: BILATERAL SALPINGECTOMY;  Surgeon: Woodroe Mode, MD;  Location: [facility];  Service: Gynecology;  Laterality: Bilateral;  . CARPAL TUNNEL RELEASE  08/30/2011   Procedure: CARPAL TUNNEL RELEASE;  Surgeon: Wynonia Sours, MD;  Location: [facility];  Service: Orthopedics;  Laterality: Right;  . CHOLECYSTECTOMY  10/11   lap choli  . GALLBLADDER SURGERY  04/04/2010  . HYSTEROSCOPY N/A 02/08/2014   Procedure: HYSTEROSCOPY;  Surgeon: Woodroe Mode, MD;  Location: [facility];  Service: Gynecology;  Laterality: N/A;  . HYSTEROSCOPY WITH NOVASURE N/A 02/08/2014   Procedure: ATTEMPTED NOVASURE ABLATION;  Surgeon: Woodroe Mode, MD;  Location: [facility];  Service: Gynecology;  Laterality: N/A;  . INTRAUTERINE DEVICE INSERTION     07/19/2013  . SEPTOPLASTY N/A 09/08/2013   Procedure: SEPTOPLASTY;  Surgeon: Ruby Cola, MD;  Location: [facility];  Service: ENT;  Laterality: N/A;  . SPINE SURGERY    . TONSILLECTOMY AND ADENOIDECTOMY Bilateral 09/08/2013   Procedure: TONSILLECTOMY ;  Surgeon: Ruby Cola, MD;  Location: [facility];  Service: ENT;  Laterality: Bilateral;  . TUBAL LIGATION    . VAGINAL HYSTERECTOMY N/A 04/12/2014   Procedure: LAPAROSCOPIC ASSISTED VAGINAL HYSTERECTOMY;  Surgeon: Woodroe Mode, MD;  Location: [facility];  Service: Gynecology;  Laterality: N/A;  . WISDOM TOOTH EXTRACTION      Social History   Socioeconomic History  . Marital status: Married    Spouse name: Not on file  . Number of children: 3  . Years of education: Not on file  . Highest education level: Not on file  Occupational History  . Not on file  Tobacco Use  . Smoking status: Current  Some Day Smoker    Packs/day: 0.25    Types: Cigarettes  . Smokeless tobacco: Never Used  . Tobacco comment: 4-5 cigarettes a day  Vaping Use  . Vaping Use: Never used  Substance and Sexual Activity  . Alcohol use: No  . Drug use: No  . Sexual activity: Not on file  Other Topics Concern  . Not on file  Social History Narrative  . Not on file   Social Determinants of Health   Financial Resource Strain:   . Difficulty of Paying Living Expenses: Not on file  Food Insecurity:   . Worried About Charity fundraiser in the Last Year: Not  on file  . Ran Out of Food in the Last Year: Not on file  Transportation Needs:   . Lack of Transportation (Medical): Not on file  . Lack of Transportation (Non-Medical): Not on file  Physical Activity:   . Days of Exercise per Week: Not on file  . Minutes of Exercise per Session: Not on file  Stress:   . Feeling of Stress : Not on file  Social Connections:   . Frequency of Communication with Friends and Family: Not on file  . Frequency of Social Gatherings with Friends and Family: Not on file  . Attends Religious Services: Not on file  . Active Member of Clubs or Organizations: Not on file  . Attends Archivist Meetings: Not on file  . Marital Status: Not on file  Intimate Partner Violence:   . Fear of Current or Ex-Partner: Not on file  . Emotionally Abused: Not on file  . Physically Abused: Not on file  . Sexually Abused: Not on file     No Known Allergies   Immunization History  Administered Date(s) Administered  . Influenza Whole 04/25/2010  . Influenza,inj,Quad PF,6+ Mos 03/07/2014, 03/10/2019  . Pneumococcal Polysaccharide-23 04/13/2014  . Td 08/02/2010    Outpatient Medications Prior to Visit  Medication Sig Dispense Refill  . ACCU-CHEK FASTCLIX LANCETS MISC USE TO CHECK BLOOD SUGAR TWICE DAILY  11  . ACCU-CHEK GUIDE test strip USE 1 STRIP TWICE DAILY  11  . albuterol (PROVENTIL) (2.5 MG/3ML) 0.083% nebulizer  solution Take 3 mLs (2.5 mg total) by nebulization every 6 (six) hours as needed for wheezing or shortness of breath. 150 mL 1  . albuterol (VENTOLIN HFA) 108 (90 Base) MCG/ACT inhaler Inhale 2 puffs into the lungs every 6 (six) hours as needed for wheezing or shortness of breath.    . cetirizine (ZYRTEC ALLERGY) 10 MG tablet Take 1 tablet (10 mg total) by mouth daily. 30 tablet 11  . fluticasone (FLONASE) 50 MCG/ACT nasal spray Place 2 sprays into both nostrils daily. 16 g 11  . Fluticasone-Salmeterol (ADVAIR DISKUS) 250-50 MCG/DOSE AEPB Inhale 1 puff into the lungs 2 (two) times daily. 60 each 11  . gabapentin (NEURONTIN) 600 MG tablet Take 1 tablet (600 mg total) by mouth 3 (three) times daily. 180 tablet 0  . ibuprofen (ADVIL) 800 MG tablet Take 1 tablet (800 mg total) by mouth every 8 (eight) hours as needed for moderate pain. Take on full stomach. 90 tablet 2  . isosorbide-hydrALAZINE (BIDIL) 20-37.5 MG tablet Take 1 tablet by mouth in the morning and at bedtime. 180 tablet 2  . lidocaine (LIDODERM) 5 % Place 1 patch onto the skin daily as needed (pain).   5  . linaclotide (LINZESS) 290 MCG CAPS capsule Take 1 capsule (290 mcg total) by mouth daily before breakfast. 90 capsule 3  . montelukast (SINGULAIR) 10 MG tablet Take 1 tablet (10 mg total) by mouth at bedtime. 30 tablet 11  . omeprazole (PRILOSEC) 20 MG capsule Take 1 capsule (20 mg total) by mouth daily. As needed 30 capsule 11  . ondansetron (ZOFRAN) 4 MG tablet Take 4 mg by mouth every 8 (eight) hours as needed for nausea or vomiting.    . Oxycodone HCl 10 MG TABS Take 10 mg by mouth every 6 (six) hours as needed (pain). Taking 3-4 times a day    . QUEtiapine Fumarate (SEROQUEL PO) Take 400 mg by mouth at bedtime. Taking for sleep    . spironolactone (  ALDACTONE) 50 MG tablet Take 1 tablet (50 mg total) by mouth daily. 90 tablet 3   No facility-administered medications prior to visit.    Review of Systems  Constitutional: Negative  for chills and fever.  HENT: Positive for congestion and sore throat.        Postnasal drip  Respiratory: Positive for cough, sputum production, shortness of breath and wheezing.   Cardiovascular: Positive for leg swelling. Negative for chest pain.  Gastrointestinal: Positive for heartburn. Negative for nausea and vomiting.  Musculoskeletal: Positive for back pain.  Endo/Heme/Allergies: Positive for environmental allergies.     Objective:   There were no vitals filed for this visit.   on   RA BMI Readings from Last 3 Encounters:  01/17/20 73.55 kg/m  01/14/20 73.55 kg/m  12/22/19 73.72 kg/m   Wt Readings from Last 3 Encounters:  01/14/20 (!) 442 lb (200.5 kg)  12/22/19 (!) 443 lb (200.9 kg)  12/09/19 (!) 443 lb 0.8 oz (201 kg)    Physical Exam Vitals reviewed.  Constitutional:      General: She is not in acute distress.    Appearance: She is obese. She is not ill-appearing.  HENT:     Head: Normocephalic and atraumatic.     Nose:     Comments: Nasal erythema, no significant drainage    Mouth/Throat:     Comments: Mild pharyngeal erythema, no plaques. Very congested sounding voice. Eyes:     General: No scleral icterus. Cardiovascular:     Rate and Rhythm: Normal rate and regular rhythm.     Heart sounds: No murmur heard.   Pulmonary:     Comments: CTAB, no conversational dyspnea. No observed coughing. Abdominal:     General: There is no distension.     Palpations: Abdomen is soft.     Tenderness: There is no abdominal tenderness.  Musculoskeletal:        General: No swelling or deformity.     Cervical back: Neck supple.  Lymphadenopathy:     Cervical: No cervical adenopathy.  Skin:    General: Skin is warm and dry.     Findings: No rash.  Neurological:     General: No focal deficit present.     Mental Status: She is alert.     Coordination: Coordination normal.  Psychiatric:        Mood and Affect: Mood normal.        Behavior: Behavior normal.       CBC    Component Value Date/Time   WBC 9.2 12/07/2019 1535   RBC 5.86 (H) 12/07/2019 1535   HGB 15.0 12/07/2019 1535   HGB 17.3 (H) 10/18/2019 1600   HGB 13.3 09/12/2014 1512   HCT 45.7 12/07/2019 1535   HCT 52.7 (H) 10/18/2019 1600   HCT 42.4 09/12/2014 1512   PLT 254.0 12/07/2019 1535   PLT 290 10/18/2019 1600   MCV 77.9 (L) 12/07/2019 1535   MCV 74 (L) 10/18/2019 1600   MCV 64.6 (L) 09/12/2014 1512   MCH 24.3 (L) 10/18/2019 1600   MCH 22.9 (L) 07/08/2019 2054   MCHC 32.8 12/07/2019 1535   RDW 18.3 (H) 12/07/2019 1535   RDW 20.5 (H) 10/18/2019 1600   RDW 21.6 (H) 09/12/2014 1512   LYMPHSABS 2.4 12/07/2019 1535   LYMPHSABS 1.9 10/18/2019 1600   LYMPHSABS 2.4 09/12/2014 1512   MONOABS 0.7 12/07/2019 1535   MONOABS 0.6 09/12/2014 1512   EOSABS 0.4 12/07/2019 1535   EOSABS 0.2 10/18/2019 1600  BASOSABS 0.1 12/07/2019 1535   BASOSABS 0.0 10/18/2019 1600   BASOSABS 0.1 09/12/2014 1512    CHEMISTRY No results for input(s): NA, K, CL, CO2, GLUCOSE, BUN, CREATININE, CALCIUM, MG, PHOS in the last 168 hours. CrCl cannot be calculated (Patient's most recent lab result is older than the maximum 21 days allowed.).   Chest Imaging- films reviewed: CXR 07/08/2019-low lung volumes with basilar atelectasis, cardiomegaly.  Poorly penetrated film.  Pulmonary Functions Testing Results: No flowsheet data found.   Echocardiogram 08/18/2019:  Study Quality: Technically Difficult  Normal LV systolic function with visual EF 50-55%. Left ventricle cavity  is normal in size. Normal global wall motion. Doppler evidence of grade II  diastolic dysfunction, elevated LAP. No obvious regional wall motion  abnormalities. Moderate left ventricular hypertrophy.  Left atrial cavity is moderately dilated.  No significant valvular abnormalities.  IVC is dilated with a respiratory response of >50%.  Prior study dated 03/2017: Mild concentric remodeling of the left  ventricle. Mild decrease  in global wall motion. Visual EF is 45-50%. Grade  I diastolic dysfunction, normal LAP. Left atrial cavity is mildly dilated.      Assessment & Plan:     ICD-10-CM   1. Nocturnal hypoxemia  G47.34 CANCELED: Pulse oximetry, overnight    CANCELED: Pulse oximetry, overnight  2. Acute bacterial sinusitis  J01.90    B96.89   3. DOE (dyspnea on exertion)  R06.00   4. Epistaxis  R04.0     Acute bacterial sinusitis, likely precipitated by difficult to control allergic rhinosinutisis -trial of augmentin x 2 weeks -at follow up if symptoms have not significantly improved recommend sinus CT scan and potentially referral to ENT. Likely would also benefit from allergy referral as well (prev on allergy shots many years ago) -con't allergic meds -saline sinus rinses BID until symptoms improve -can add mucinex BID  Allergic rhinosinusitis -Con't Claritin and Singulair once daily -Hold flonase until follow up given epistaxis. -Con't allergy avoidance  Dyspnea and chronic cough-  allergic asthma (eos 400, IgE 198) -PFTs still needed; would wait for acute symptoms to improve some -Con't  Advair twice daily.  Rinse her mouth after every use. No indication for steroids as breathing is stable. -Albuterol as needed -Recommend Covid, pneumonia, yearly flu vaccines -Optimize GERD and rhinosinusitis management  GERD -Cont' PPI once daily. -Long-term weight loss will be an important part of her management  Morbid obesity with nocturnal hypoxia; no OSA on PSG -Agree with cardiology that bariatric surgery evaluation is appropriate -Recommend modest weight loss as a long-term goal -Con't nocturnal supplemental O2 -ABG not performed to evaluate for hypercapnia; can be performed when closer to baseline.    RTC in 2 weeks.     Current Outpatient Medications:  .  ACCU-CHEK FASTCLIX LANCETS MISC, USE TO CHECK BLOOD SUGAR TWICE DAILY, Disp: , Rfl: 11 .  ACCU-CHEK GUIDE test strip, USE 1 STRIP TWICE  DAILY, Disp: , Rfl: 11 .  albuterol (PROVENTIL) (2.5 MG/3ML) 0.083% nebulizer solution, Take 3 mLs (2.5 mg total) by nebulization every 6 (six) hours as needed for wheezing or shortness of breath., Disp: 150 mL, Rfl: 1 .  albuterol (VENTOLIN HFA) 108 (90 Base) MCG/ACT inhaler, Inhale 2 puffs into the lungs every 6 (six) hours as needed for wheezing or shortness of breath., Disp: , Rfl:  .  cetirizine (ZYRTEC ALLERGY) 10 MG tablet, Take 1 tablet (10 mg total) by mouth daily., Disp: 30 tablet, Rfl: 11 .  fluticasone (FLONASE) 50 MCG/ACT nasal  spray, Place 2 sprays into both nostrils daily., Disp: 16 g, Rfl: 11 .  Fluticasone-Salmeterol (ADVAIR DISKUS) 250-50 MCG/DOSE AEPB, Inhale 1 puff into the lungs 2 (two) times daily., Disp: 60 each, Rfl: 11 .  gabapentin (NEURONTIN) 600 MG tablet, Take 1 tablet (600 mg total) by mouth 3 (three) times daily., Disp: 180 tablet, Rfl: 0 .  ibuprofen (ADVIL) 800 MG tablet, Take 1 tablet (800 mg total) by mouth every 8 (eight) hours as needed for moderate pain. Take on full stomach., Disp: 90 tablet, Rfl: 2 .  isosorbide-hydrALAZINE (BIDIL) 20-37.5 MG tablet, Take 1 tablet by mouth in the morning and at bedtime., Disp: 180 tablet, Rfl: 2 .  lidocaine (LIDODERM) 5 %, Place 1 patch onto the skin daily as needed (pain). , Disp: , Rfl: 5 .  linaclotide (LINZESS) 290 MCG CAPS capsule, Take 1 capsule (290 mcg total) by mouth daily before breakfast., Disp: 90 capsule, Rfl: 3 .  montelukast (SINGULAIR) 10 MG tablet, Take 1 tablet (10 mg total) by mouth at bedtime., Disp: 30 tablet, Rfl: 11 .  omeprazole (PRILOSEC) 20 MG capsule, Take 1 capsule (20 mg total) by mouth daily. As needed, Disp: 30 capsule, Rfl: 11 .  ondansetron (ZOFRAN) 4 MG tablet, Take 4 mg by mouth every 8 (eight) hours as needed for nausea or vomiting., Disp: , Rfl:  .  Oxycodone HCl 10 MG TABS, Take 10 mg by mouth every 6 (six) hours as needed (pain). Taking 3-4 times a day, Disp: , Rfl:  .  QUEtiapine Fumarate  (SEROQUEL PO), Take 400 mg by mouth at bedtime. Taking for sleep, Disp: , Rfl:  .  spironolactone (ALDACTONE) 50 MG tablet, Take 1 tablet (50 mg total) by mouth daily., Disp: 90 tablet, Rfl: 3     Julian Hy, DO [facility] 02/22/2020 8:16 AM

## 2020-02-22 NOTE — Patient Instructions (Addendum)
Thank you for visiting Dr. Carlis Abbott at [facility]. We recommend the following:  -Stop flonase for the next 2 weeks while you are on antibiotics. -Use sinus nasal saline rinse 2 times per day (if using [facility], use boiled water) -Take antibiotics with food two times per day until they are completed. -Keep all other medications the same.  Meds ordered this encounter  Medications  . amoxicillin-clavulanate (AUGMENTIN) 875-125 MG tablet    Sig: Take 1 tablet by mouth 2 (two) times daily.    Dispense:  28 tablet    Refill:  0    Return in about 2 weeks (around 03/07/2020).    Please do your part to reduce the spread of COVID-19.

## 2020-03-06 ENCOUNTER — Telehealth (INDEPENDENT_AMBULATORY_CARE_PROVIDER_SITE_OTHER): Payer: Medicaid Other | Admitting: Nurse Practitioner

## 2020-03-06 DIAGNOSIS — G894 Chronic pain syndrome: Secondary | ICD-10-CM | POA: Diagnosis not present

## 2020-03-06 DIAGNOSIS — Z6841 Body Mass Index (BMI) 40.0 and over, adult: Secondary | ICD-10-CM

## 2020-03-06 DIAGNOSIS — M5126 Other intervertebral disc displacement, lumbar region: Secondary | ICD-10-CM | POA: Diagnosis not present

## 2020-03-06 MED ORDER — ONDANSETRON HCL 4 MG PO TABS: 4.0000 mg | ORAL_TABLET | Freq: Three times a day (TID) | ORAL | 11 refills | Status: DC | PRN

## 2020-03-06 NOTE — Progress Notes (Signed)
Cynthia Becker, [place]  [address] Phone:  [phone]   Fax:  [phone]    Virtual telephone visit      Virtual Visit via Telephone Note   This visit type was conducted due to national recommendations for restrictions regarding the COVID-19 Pandemic (e.g. social distancing) in an effort to limit this patient's exposure and mitigate transmission in our community. Due to her co-morbid illnesses, this patient is at least at moderate risk for complications without adequate follow up. This format is felt to be most appropriate for this patient at this time. The patient did not have access to video technology or had technical difficulties with video requiring transitioning to audio format only (telephone). Physical exam was limited to content and character of the telephone converstion.    Patient location: home Provider location: office   Patient: Cynthia Becker   DOB: 1970-07-04   49 y.o. Female  MRN: [id number] Visit Date: 03/06/2020  Today's Provider: [name], NP  Subjective:    Chief Complaint  Patient presents with  . Follow-up    wants FMLA for her   HPI  Knee Pain Patient presents with knee pain involving both knees. Onset of the symptoms was several years ago. Inciting event: none known. Current symptoms include giving out, locking, pain located leg, stiffness and swelling. Pain is aggravated by any weight bearing, inactivity, rising after sitting and walking. Patient has had prior knee problems. Evaluation to date: plain films: abnormal mild deg changes in 2015. Treatment to date: avoidance of offending activity, OTC analgesics which are not very effective, prescription NSAIDS which are not very effective and rest.    Medications: Outpatient Medications Prior to Visit  Medication Sig  . ACCU-CHEK FASTCLIX LANCETS MISC USE TO CHECK BLOOD SUGAR TWICE DAILY  . ACCU-CHEK GUIDE test strip USE 1 STRIP TWICE DAILY  . albuterol  (PROVENTIL) (2.5 MG/3ML) 0.083% nebulizer solution Take 3 mLs (2.5 mg total) by nebulization every 6 (six) hours as needed for wheezing or shortness of breath.  Marland Kitchen albuterol (VENTOLIN HFA) 108 (90 Base) MCG/ACT inhaler Inhale 2 puffs into the lungs every 6 (six) hours as needed for wheezing or shortness of breath.  Marland Kitchen amoxicillin-clavulanate (AUGMENTIN) 875-125 MG tablet Take 1 tablet by mouth 2 (two) times daily.  . cetirizine (ZYRTEC ALLERGY) 10 MG tablet Take 1 tablet (10 mg total) by mouth daily.  . Fluticasone-Salmeterol (ADVAIR DISKUS) 250-50 MCG/DOSE AEPB Inhale 1 puff into the lungs 2 (two) times daily.  Marland Kitchen gabapentin (NEURONTIN) 600 MG tablet Take 1 tablet (600 mg total) by mouth 3 (three) times daily.  Marland Kitchen ibuprofen (ADVIL) 800 MG tablet Take 1 tablet (800 mg total) by mouth every 8 (eight) hours as needed for moderate pain. Take on full stomach.  . isosorbide-hydrALAZINE (BIDIL) 20-37.5 MG tablet Take 1 tablet by mouth in the morning and at bedtime.  . lidocaine (LIDODERM) 5 % Place 1 patch onto the skin daily as needed (pain).   Marland Kitchen linaclotide (LINZESS) 290 MCG CAPS capsule Take 1 capsule (290 mcg total) by mouth daily before breakfast.  . montelukast (SINGULAIR) 10 MG tablet Take 1 tablet (10 mg total) by mouth at bedtime.  Marland Kitchen omeprazole (PRILOSEC) 20 MG capsule Take 1 capsule (20 mg total) by mouth daily. As needed  . Oxycodone HCl 10 MG TABS Take 10 mg by mouth every 6 (six) hours as needed (pain). Taking 3-4 times a day  . QUEtiapine Fumarate (SEROQUEL PO) Take  400 mg by mouth at bedtime. Taking for sleep  . spironolactone (ALDACTONE) 50 MG tablet Take 1 tablet (50 mg total) by mouth daily.  . [DISCONTINUED] ondansetron (ZOFRAN) 4 MG tablet Take 4 mg by mouth every 8 (eight) hours as needed for nausea or vomiting.  . fluticasone (FLONASE) 50 MCG/ACT nasal spray Place 2 sprays into both nostrils daily. (Patient not taking: Reported on 03/06/2020)   No facility-administered medications prior  to visit.    Review of Systems  Objective:        Assessment & Plan:    Assessment  Primary Diagnosis & Pertinent Problem List: The primary encounter diagnosis was DEGENERATIVE DISC DISEASE, LUMBOSACRAL SPINE W/RADICULOPATHY. Diagnoses of Morbid obesity with BMI of 70 and over, adult [facility]) and Chronic pain syndrome were also pertinent to this visit.  Visit Diagnosis: 1. DEGENERATIVE DISC DISEASE, LUMBOSACRAL SPINE W/RADICULOPATHY   2. Morbid obesity with BMI of 70 and over, adult ([place])   3. Chronic pain syndrome       I discussed the assessment and treatment plan with the patient. The patient was provided an opportunity to ask questions and all were answered. The patient agreed with the plan and demonstrated an understanding of the instructions.   The patient was advised to call back or seek an in-person evaluation if the symptoms worsen or if the condition fails to improve as anticipated.  I provided 19 minutes of non-face-to-face time during this encounter.   [name], NP  [place] [phone] (phone) [phone] (fax)  [place]

## 2020-03-06 NOTE — Patient Instructions (Signed)

## 2020-03-07 ENCOUNTER — Ambulatory Visit: Payer: Medicaid Other | Admitting: Primary Care

## 2020-03-10 ENCOUNTER — Ambulatory Visit: Payer: Medicaid Other | Admitting: Nurse Practitioner

## 2020-03-17 ENCOUNTER — Ambulatory Visit (INDEPENDENT_AMBULATORY_CARE_PROVIDER_SITE_OTHER): Payer: Medicaid Other | Admitting: Primary Care

## 2020-03-17 ENCOUNTER — Other Ambulatory Visit: Payer: Self-pay

## 2020-03-17 ENCOUNTER — Encounter: Payer: Self-pay | Admitting: Primary Care

## 2020-03-17 VITALS — BP 128/80 | HR 89 | Temp 97.6°F | Ht 65.0 in | Wt >= 6400 oz

## 2020-03-17 DIAGNOSIS — J45909 Unspecified asthma, uncomplicated: Secondary | ICD-10-CM

## 2020-03-17 DIAGNOSIS — J329 Chronic sinusitis, unspecified: Secondary | ICD-10-CM | POA: Diagnosis not present

## 2020-03-17 DIAGNOSIS — G4734 Idiopathic sleep related nonobstructive alveolar hypoventilation: Secondary | ICD-10-CM | POA: Diagnosis not present

## 2020-03-17 MED ORDER — OMEPRAZOLE 40 MG PO CPDR: 40.0000 mg | DELAYED_RELEASE_CAPSULE | Freq: Every day | ORAL | 3 refills | Status: DC

## 2020-03-17 MED ORDER — PREDNISONE 10 MG PO TABS: ORAL_TABLET | ORAL | 0 refills | Status: DC

## 2020-03-17 NOTE — Patient Instructions (Addendum)
Recommendation: - Continue Advair 1 puffs twice daily (rinse mouth after use) - Continue 1L oxygen at night  - You need to schedule pulmonary function testing (we have available slot Oct 18th at 4pm); you will need to get tested for covid on Oct 15th PRIOR to testing  - Call cardiologist to follow-up on diuretic  - Consider getting covid vaccine when feeling better   Rx: - Prednisone taper as prescribed   Referral: - ENT re: chronic sinusitis   Orders: - No labs today needed   Follow-up: - 2-4 weeks with PFTs apt/ televisit ok next day     COVID-19 COVID-19 is a respiratory infection that is caused by a virus called severe acute respiratory syndrome coronavirus 2 (SARS-CoV-2). The disease is also known as coronavirus disease or novel coronavirus. In some people, the virus may not cause any symptoms. In others, it may cause a serious infection. The infection can get worse quickly and can lead to complications, such as:  Pneumonia, or infection of the lungs.  Acute respiratory distress syndrome or ARDS. This is a condition in which fluid build-up in the lungs prevents the lungs from filling with air and passing oxygen into the blood.  Acute respiratory failure. This is a condition in which there is not enough oxygen passing from the lungs to the body or when carbon dioxide is not passing from the lungs out of the body.  Sepsis or septic shock. This is a serious bodily reaction to an infection.  Blood clotting problems.  Secondary infections due to bacteria or fungus.  Organ failure. This is when your body's organs stop working. The virus that causes COVID-19 is contagious. This means that it can spread from person to person through droplets from coughs and sneezes (respiratory secretions). What are the causes? This illness is caused by a virus. You may catch the virus by:  Breathing in droplets from an infected person. Droplets can be spread by a person breathing, speaking,  singing, coughing, or sneezing.  Touching something, like a table or a doorknob, that was exposed to the virus (contaminated) and then touching your mouth, nose, or eyes. What increases the risk? Risk for infection You are more likely to be infected with this virus if you:  Are within 6 feet (2 meters) of a person with COVID-19.  Provide care for or live with a person who is infected with COVID-19.  Spend time in crowded indoor spaces or live in shared housing. Risk for serious illness You are more likely to become seriously ill from the virus if you:  Are 69 years of age or older. The higher your age, the more you are at risk for serious illness.  Live in a nursing home or long-term care facility.  Have cancer.  Have a long-term (chronic) disease such as: ? Chronic lung disease, including chronic obstructive pulmonary disease or asthma. ? A long-term disease that lowers your body's ability to fight infection (immunocompromised). ? Heart disease, including heart failure, a condition in which the arteries that lead to the heart become narrow or blocked (coronary artery disease), a disease which makes the heart muscle thick, weak, or stiff (cardiomyopathy). ? Diabetes. ? Chronic kidney disease. ? Sickle cell disease, a condition in which red blood cells have an abnormal "sickle" shape. ? Liver disease.  Are obese. What are the signs or symptoms? Symptoms of this condition can range from mild to severe. Symptoms may appear any time from 2 to 14 days after being  exposed to the virus. They include:  A fever or chills.  A cough.  Difficulty breathing.  Headaches, body aches, or muscle aches.  Runny or stuffy (congested) nose.  A sore throat.  New loss of taste or smell. Some people may also have stomach problems, such as nausea, vomiting, or diarrhea. Other people may not have any symptoms of COVID-19. How is this diagnosed? This condition may be diagnosed based  on:  Your signs and symptoms, especially if: ? You live in an area with a COVID-19 outbreak. ? You recently traveled to or from an area where the virus is common. ? You provide care for or live with a person who was diagnosed with COVID-19. ? You were exposed to a person who was diagnosed with COVID-19.  A physical exam.  Lab tests, which may include: ? Taking a sample of fluid from the back of your nose and throat (nasopharyngeal fluid), your nose, or your throat using a swab. ? A sample of mucus from your lungs (sputum). ? Blood tests.  Imaging tests, which may include, X-rays, CT scan, or ultrasound. How is this treated? At present, there is no medicine to treat COVID-19. Medicines that treat other diseases are being used on a trial basis to see if they are effective against COVID-19. Your health care provider will talk with you about ways to treat your symptoms. For most people, the infection is mild and can be managed at home with rest, fluids, and over-the-counter medicines. Treatment for a serious infection usually takes places in a hospital intensive care unit (ICU). It may include one or more of the following treatments. These treatments are given until your symptoms improve.  Receiving fluids and medicines through an IV.  Supplemental oxygen. Extra oxygen is given through a tube in the nose, a face mask, or a hood.  Positioning you to lie on your stomach (prone position). This makes it easier for oxygen to get into the lungs.  Continuous positive airway pressure (CPAP) or bi-level positive airway pressure (BPAP) machine. This treatment uses mild air pressure to keep the airways open. A tube that is connected to a motor delivers oxygen to the body.  Ventilator. This treatment moves air into and out of the lungs by using a tube that is placed in your windpipe.  Tracheostomy. This is a procedure to create a hole in the neck so that a breathing tube can be  inserted.  Extracorporeal membrane oxygenation (ECMO). This procedure gives the lungs a chance to recover by taking over the functions of the heart and lungs. It supplies oxygen to the body and removes carbon dioxide. Follow these instructions at home: Lifestyle  If you are sick, stay home except to get medical care. Your health care provider will tell you how long to stay home. Call your health care provider before you go for medical care.  Rest at home as told by your health care provider.  Do not use any products that contain nicotine or tobacco, such as cigarettes, e-cigarettes, and chewing tobacco. If you need help quitting, ask your health care provider.  Return to your normal activities as told by your health care provider. Ask your health care provider what activities are safe for you. General instructions  Take over-the-counter and prescription medicines only as told by your health care provider.  Drink enough fluid to keep your urine pale yellow.  Keep all follow-up visits as told by your health care provider. This is important. How is this  prevented?  There is no vaccine to help prevent COVID-19 infection. However, there are steps you can take to protect yourself and others from this virus. To protect yourself:   Do not travel to areas where COVID-19 is a risk. The areas where COVID-19 is reported change often. To identify high-risk areas and travel restrictions, check the CDC travel website: FatFares.com.br  If you live in, or must travel to, an area where COVID-19 is a risk, take precautions to avoid infection. ? Stay away from people who are sick. ? Wash your hands often with soap and water for 20 seconds. If soap and water are not available, use an alcohol-based hand sanitizer. ? Avoid touching your mouth, face, eyes, or nose. ? Avoid going out in public, follow guidance from your state and local health authorities. ? If you must go out in public, wear a  cloth face covering or face mask. Make sure your mask covers your nose and mouth. ? Avoid crowded indoor spaces. Stay at least 6 feet (2 meters) away from others. ? Disinfect objects and surfaces that are frequently touched every day. This may include:  Counters and tables.  Doorknobs and light switches.  Sinks and faucets.  Electronics, such as phones, remote controls, keyboards, computers, and tablets. To protect others: If you have symptoms of COVID-19, take steps to prevent the virus from spreading to others.  If you think you have a COVID-19 infection, contact your health care provider right away. Tell your health care team that you think you may have a COVID-19 infection.  Stay home. Leave your house only to seek medical care. Do not use public transport.  Do not travel while you are sick.  Wash your hands often with soap and water for 20 seconds. If soap and water are not available, use alcohol-based hand sanitizer.  Stay away from other members of your household. Let healthy household members care for children and pets, if possible. If you have to care for children or pets, wash your hands often and wear a mask. If possible, stay in your own room, separate from others. Use a different bathroom.  Make sure that all people in your household wash their hands well and often.  Cough or sneeze into a tissue or your sleeve or elbow. Do not cough or sneeze into your hand or into the air.  Wear a cloth face covering or face mask. Make sure your mask covers your nose and mouth. Where to find more information  Centers for Disease Control and Prevention: PurpleGadgets.be  World Health Organization: https://www.castaneda.info/ Contact a health care provider if:  You live in or have traveled to an area where COVID-19 is a risk and you have symptoms of the infection.  You have had contact with someone who has COVID-19 and you have symptoms of the  infection. Get help right away if:  You have trouble breathing.  You have pain or pressure in your chest.  You have confusion.  You have bluish lips and fingernails.  You have difficulty waking from sleep.  You have symptoms that get worse. These symptoms may represent a serious problem that is an emergency. Do not wait to see if the symptoms will go away. Get medical help right away. Call your local emergency services (911 in the [place] Do not drive yourself to the hospital. Let the emergency medical personnel know if you think you have COVID-19. Summary  COVID-19 is a respiratory infection that is caused by a virus. It is  also known as coronavirus disease or novel coronavirus. It can cause serious infections, such as pneumonia, acute respiratory distress syndrome, acute respiratory failure, or sepsis.  The virus that causes COVID-19 is contagious. This means that it can spread from person to person through droplets from breathing, speaking, singing, coughing, or sneezing.  You are more likely to develop a serious illness if you are 73 years of age or older, have a weak immune system, live in a nursing home, or have chronic disease.  There is no medicine to treat COVID-19. Your health care provider will talk with you about ways to treat your symptoms.  Take steps to protect yourself and others from infection. Wash your hands often and disinfect objects and surfaces that are frequently touched every day. Stay away from people who are sick and wear a mask if you are sick. This information is not intended to replace advice given to you by your health care provider. Make sure you discuss any questions you have with your health care provider. Document Revised: 04/09/2019 Document Reviewed: 07/16/2018 Elsevier Patient Education  [place].

## 2020-03-17 NOTE — Progress Notes (Signed)
@Patient  ID: Cynthia Becker, female    DOB: 08-Jun-1971, 49 y.o.   MRN: [id number]  Chief Complaint  Patient presents with  . Follow-up    Pt states she has had complaints of chest congestion, wheezing, cough with yellow phlegm, and also worsening SOB which pt states has been going on since last office visit.     Referring provider: [name], NP  HPI: 49 year old female, current some day smoker. PMH allergic asthma, OSA, HTN, obesity. Patient of Dr. Carlis Abbott, last seen on 02/22/20. Treated for acute exacerbation with Augmentin. Maintained on Advair twice daily.  Eosinophils 400, IgE 198 in June 2021. Still needs PFTs.  03/17/2020 Patient presents today for 3-4 weeks follow-up. She is not feeling better after completing course of Augmentin. Antibiotic did help her sinusitis symptoms a little. She has some residual chest congestion, cough with yellow mucus and shortness of breath. She is compliant with Advair twice daily and Singulair. She hasn't notice a significant improvement since starting ICS/LABA in June. She uses ventolin inhaler upwards of 3 times a day with improvement. Flonase causes her to have nose bleeds and ocean nasal spray did not help. She was previous on allergy shots.   Cardiologist referred her to bariatric surgery. This was on hold d/t covid. She reports increased lower extremity swelling. Her BNP in April was normal. She is currently on Spirnolactone 50mg  daily. Sleep study showed no evidence of OSA. AHI 3.1. SpO2 low 79%, required 1 L supplemental oxygen at night.     No Known Allergies  Immunization History  Administered Date(s) Administered  . Influenza Whole 04/25/2010  . Influenza,inj,Quad PF,6+ Mos 03/07/2014, 03/10/2019  . Pneumococcal Polysaccharide-23 04/13/2014  . Td 08/02/2010    Past Medical History:  Diagnosis Date  . Anemia    iv iron treatment dec 2014/ see oncology for this last in dec 2014  . Anxiety   . Arthritis    knee, hands  . Asthma      seasonal related/ albuterol used when uri  . CN (constipation) 09/14/2014  . [place] DISEASE, LUMBOSACRAL SPINE W/RADICULOPATHY 11/01/2009   Qualifier: Diagnosis of  By: Jorene Minors, [place]    . Depression   . Diabetes ([place])   . H/O dizziness    Hx  . Headache(784.0)    otc med prn  . High blood pressure   . HYPERLIPIDEMIA 02/13/2010   Qualifier: Diagnosis of  By: Jorene Minors, [place]    . INSOMNIA 08/24/2008   Qualifier: Diagnosis of  By: Radene Ou MD, [place]    . Morbid obesity ([place]) 05/24/2008   Qualifier: Diagnosis of  By: Radene Ou MD, [place]    . SVD (spontaneous vaginal delivery)    x 3    Tobacco History: Social History   Tobacco Use  Smoking Status Current Some Day Smoker  . Packs/day: 0.25  . Types: Cigarettes  Smokeless Tobacco Never Used  Tobacco Comment   4-5 cigarettes a day   Ready to quit: Not Answered Counseling given: Not Answered Comment: 4-5 cigarettes a day   Outpatient Medications Prior to Visit  Medication Sig Dispense Refill  . ACCU-CHEK FASTCLIX LANCETS MISC USE TO CHECK BLOOD SUGAR TWICE DAILY  11  . ACCU-CHEK GUIDE test strip USE 1 STRIP TWICE DAILY  11  . albuterol (PROVENTIL) (2.5 MG/3ML) 0.083% nebulizer solution Take 3 mLs (2.5 mg total) by nebulization every 6 (six) hours as needed for wheezing or shortness of breath. 150 mL 1  . albuterol (VENTOLIN  HFA) 108 (90 Base) MCG/ACT inhaler Inhale 2 puffs into the lungs every 6 (six) hours as needed for wheezing or shortness of breath.    . cetirizine (ZYRTEC ALLERGY) 10 MG tablet Take 1 tablet (10 mg total) by mouth daily. 30 tablet 11  . Fluticasone-Salmeterol (ADVAIR DISKUS) 250-50 MCG/DOSE AEPB Inhale 1 puff into the lungs 2 (two) times daily. 60 each 11  . gabapentin (NEURONTIN) 600 MG tablet Take 1 tablet (600 mg total) by mouth 3 (three) times daily. 180 tablet 0  . ibuprofen (ADVIL) 800 MG tablet Take 1 tablet (800 mg total) by mouth every 8 (eight) hours as needed for moderate pain.  Take on full stomach. 90 tablet 2  . isosorbide-hydrALAZINE (BIDIL) 20-37.5 MG tablet Take 1 tablet by mouth in the morning and at bedtime. 180 tablet 2  . lidocaine (LIDODERM) 5 % Place 1 patch onto the skin daily as needed (pain).   5  . linaclotide (LINZESS) 290 MCG CAPS capsule Take 1 capsule (290 mcg total) by mouth daily before breakfast. 90 capsule 3  . montelukast (SINGULAIR) 10 MG tablet Take 1 tablet (10 mg total) by mouth at bedtime. 30 tablet 11  . ondansetron (ZOFRAN) 4 MG tablet Take 1 tablet (4 mg total) by mouth every 8 (eight) hours as needed for nausea or vomiting. 20 tablet 11  . Oxycodone HCl 10 MG TABS Take 10 mg by mouth every 6 (six) hours as needed (pain). Taking 3-4 times a day    . QUEtiapine Fumarate (SEROQUEL PO) Take 400 mg by mouth at bedtime. Taking for sleep    . spironolactone (ALDACTONE) 50 MG tablet Take 1 tablet (50 mg total) by mouth daily. 90 tablet 3  . omeprazole (PRILOSEC) 20 MG capsule Take 1 capsule (20 mg total) by mouth daily. As needed 30 capsule 11  . fluticasone (FLONASE) 50 MCG/ACT nasal spray Place 2 sprays into both nostrils daily. (Patient not taking: Reported on 03/17/2020) 16 g 11  . amoxicillin-clavulanate (AUGMENTIN) 875-125 MG tablet Take 1 tablet by mouth 2 (two) times daily. 28 tablet 0   No facility-administered medications prior to visit.    Review of Systems  Review of Systems  Constitutional: Negative.   HENT: Positive for congestion and postnasal drip.   Respiratory: Positive for shortness of breath and wheezing.     Physical Exam  BP 128/80 (BP Location: Right Wrist, Cuff Size: Normal)   Pulse 89   Temp 97.6 F (36.4 C) (Other (Comment)) Comment (Src): wrist  Ht 5\' 5"  (1.651 m)   Wt (!) 445 lb (201.9 kg)   LMP 02/13/2014   SpO2 96%   BMI 74.05 kg/m  Physical Exam Constitutional:      General: She is not in acute distress.    Appearance: Normal appearance. She is obese. She is not ill-appearing.  HENT:      Mouth/Throat:     Comments: Deferred d/t masking Cardiovascular:     Rate and Rhythm: Normal rate and regular rhythm.     Comments: +3BLE edema Pulmonary:     Effort: Pulmonary effort is normal.     Breath sounds: Wheezing present. No rhonchi.     Comments: Distant wheezing Musculoskeletal:     Comments: In WC  Skin:    General: Skin is warm and dry.  Neurological:     General: No focal deficit present.     Mental Status: She is alert and oriented to person, place, and time. Mental status is at baseline.  Psychiatric:        Mood and Affect: Mood normal.        Behavior: Behavior normal.        Thought Content: Thought content normal.        Judgment: Judgment normal.      Lab Results:  CBC    Component Value Date/Time   WBC 9.2 12/07/2019 1535   RBC 5.86 (H) 12/07/2019 1535   HGB 15.0 12/07/2019 1535   HGB 17.3 (H) 10/18/2019 1600   HGB 13.3 09/12/2014 1512   HCT 45.7 12/07/2019 1535   HCT 52.7 (H) 10/18/2019 1600   HCT 42.4 09/12/2014 1512   PLT 254.0 12/07/2019 1535   PLT 290 10/18/2019 1600   MCV 77.9 (L) 12/07/2019 1535   MCV 74 (L) 10/18/2019 1600   MCV 64.6 (L) 09/12/2014 1512   MCH 24.3 (L) 10/18/2019 1600   MCH 22.9 (L) 07/08/2019 2054   MCHC 32.8 12/07/2019 1535   RDW 18.3 (H) 12/07/2019 1535   RDW 20.5 (H) 10/18/2019 1600   RDW 21.6 (H) 09/12/2014 1512   LYMPHSABS 2.4 12/07/2019 1535   LYMPHSABS 1.9 10/18/2019 1600   LYMPHSABS 2.4 09/12/2014 1512   MONOABS 0.7 12/07/2019 1535   MONOABS 0.6 09/12/2014 1512   EOSABS 0.4 12/07/2019 1535   EOSABS 0.2 10/18/2019 1600   BASOSABS 0.1 12/07/2019 1535   BASOSABS 0.0 10/18/2019 1600   BASOSABS 0.1 09/12/2014 1512    BMET    Component Value Date/Time   NA 140 01/17/2020 1530   NA 137 04/07/2013 1508   K 4.6 01/17/2020 1530   K 3.8 04/07/2013 1508   CL 98 01/17/2020 1530   CO2 28 08/17/2019 1650   CO2 23 04/07/2013 1508   GLUCOSE 111 (H) 01/17/2020 1530   GLUCOSE 123 (H) 07/08/2019 2054   GLUCOSE  96 04/07/2013 1508   BUN 12 01/17/2020 1530   BUN 11.5 04/07/2013 1508   CREATININE 0.80 01/17/2020 1530   CREATININE 0.8 04/07/2013 1508   CALCIUM 9.4 01/17/2020 1530   CALCIUM 9.2 04/07/2013 1508   GFRNONAA 87 01/17/2020 1530   GFRAA 100 01/17/2020 1530    BNP    Component Value Date/Time   BNP 93.1 07/08/2019 2054    ProBNP    Component Value Date/Time   PROBNP 12 10/18/2019 1600   PROBNP 35.0 03/24/2008 1302    Imaging: No results found.   Assessment & Plan:   Asthmatic bronchitis - Treated for acute exacerbation with Augmentin in August 2021. She had mild improvement in sinusitis symptom but continues to have residual chest congestion, cough with yellow mucus. Eosinophils 400, IgE 198 in June 2021. - Continue Advair diskus 250-50 twice daily  - RX prednisone taper as prescribed - Consider adding leukotriene inhibitor at next visit, depending on PFTs results may adjust inhaler regimen  - Needs to schedule Pulmonary function testing  - Refer to ENT for sinusitis  - FU in 2-4 weeks televisit   Nocturnal hypoxia - Sleep study in July 2021 showed no evidence of OSA - SpO2 low 79%, required 1 L supplemental oxygen at night.      Martyn Ehrich, NP 03/21/2020

## 2020-03-21 ENCOUNTER — Encounter: Payer: Self-pay | Admitting: Primary Care

## 2020-03-21 DIAGNOSIS — G4734 Idiopathic sleep related nonobstructive alveolar hypoventilation: Secondary | ICD-10-CM | POA: Insufficient documentation

## 2020-03-21 DIAGNOSIS — J45909 Unspecified asthma, uncomplicated: Secondary | ICD-10-CM | POA: Insufficient documentation

## 2020-03-21 NOTE — Assessment & Plan Note (Addendum)
-   Treated for acute exacerbation with Augmentin in August 2021. She had mild improvement in sinusitis symptom but continues to have residual chest congestion, cough with yellow mucus. Eosinophils 400, IgE 198 in June 2021. - Continue Advair diskus 250-50 twice daily  - RX prednisone taper as prescribed - Consider adding leukotriene inhibitor at next visit, depending on PFTs results may adjust inhaler regimen  - Needs to schedule Pulmonary function testing  - Refer to ENT for sinusitis  - FU in 2-4 weeks televisit

## 2020-03-21 NOTE — Assessment & Plan Note (Addendum)
-   Sleep study in July 2021 showed no evidence of OSA - SpO2 low 79%, required 1 L supplemental oxygen at night.

## 2020-03-24 ENCOUNTER — Other Ambulatory Visit: Payer: Self-pay | Admitting: Nurse Practitioner

## 2020-03-24 DIAGNOSIS — R0602 Shortness of breath: Secondary | ICD-10-CM

## 2020-04-07 ENCOUNTER — Other Ambulatory Visit (HOSPITAL_COMMUNITY): Payer: Medicaid Other

## 2020-04-11 ENCOUNTER — Ambulatory Visit: Payer: Medicaid Other | Admitting: Primary Care

## 2020-04-19 ENCOUNTER — Other Ambulatory Visit: Payer: Self-pay

## 2020-04-19 ENCOUNTER — Encounter: Payer: Self-pay | Admitting: Nurse Practitioner

## 2020-04-19 ENCOUNTER — Ambulatory Visit (INDEPENDENT_AMBULATORY_CARE_PROVIDER_SITE_OTHER): Payer: Medicaid Other | Admitting: Nurse Practitioner

## 2020-04-19 VITALS — BP 154/95 | HR 98 | Temp 98.1°F | Resp 17 | Ht 65.0 in | Wt >= 6400 oz

## 2020-04-19 DIAGNOSIS — E119 Type 2 diabetes mellitus without complications: Secondary | ICD-10-CM | POA: Diagnosis not present

## 2020-04-19 DIAGNOSIS — Z6841 Body Mass Index (BMI) 40.0 and over, adult: Secondary | ICD-10-CM

## 2020-04-19 DIAGNOSIS — J4540 Moderate persistent asthma, uncomplicated: Secondary | ICD-10-CM | POA: Diagnosis not present

## 2020-04-19 DIAGNOSIS — Z789 Other specified health status: Secondary | ICD-10-CM

## 2020-04-19 DIAGNOSIS — E0843 Diabetes mellitus due to underlying condition with diabetic autonomic (poly)neuropathy: Secondary | ICD-10-CM

## 2020-04-19 DIAGNOSIS — R600 Localized edema: Secondary | ICD-10-CM

## 2020-04-19 DIAGNOSIS — M79604 Pain in right leg: Secondary | ICD-10-CM

## 2020-04-19 DIAGNOSIS — D582 Other hemoglobinopathies: Secondary | ICD-10-CM

## 2020-04-19 DIAGNOSIS — M79605 Pain in left leg: Secondary | ICD-10-CM

## 2020-04-19 DIAGNOSIS — I10 Essential (primary) hypertension: Secondary | ICD-10-CM

## 2020-04-19 LAB — POCT URINALYSIS DIPSTICK
Bilirubin, UA: NEGATIVE
Blood, UA: NEGATIVE
Glucose, UA: NEGATIVE
Ketones, UA: NEGATIVE
Leukocytes, UA: NEGATIVE
Nitrite, UA: NEGATIVE
Protein, UA: NEGATIVE
Spec Grav, UA: 1.01 (ref 1.010–1.025)
Urobilinogen, UA: 0.2 E.U./dL
pH, UA: 7 (ref 5.0–8.0)

## 2020-04-19 LAB — POCT GLYCOSYLATED HEMOGLOBIN (HGB A1C)
HbA1c POC (<> result, manual entry): 7.3 % (ref 4.0–5.6)
HbA1c, POC (controlled diabetic range): 7.3 % — AB (ref 0.0–7.0)
HbA1c, POC (prediabetic range): 7.3 % — AB (ref 5.7–6.4)
Hemoglobin A1C: 7.3 % — AB (ref 4.0–5.6)

## 2020-04-19 LAB — GLUCOSE, POCT (MANUAL RESULT ENTRY): POC Glucose: 128 mg/dl — AB (ref 70–99)

## 2020-04-19 MED ORDER — GLIPIZIDE 5 MG PO TABS: 5.0000 mg | ORAL_TABLET | Freq: Two times a day (BID) | ORAL | 11 refills | Status: DC

## 2020-04-19 NOTE — Progress Notes (Addendum)
[name], [name]  [address] Phone:  [phone]   Fax:  [phone]   Established Patient Office Visit  Subjective:  Patient ID: Cynthia Becker, female    DOB: 12-29-1970  Age: 49 y.o. MRN: [id number]  CC:  Chief Complaint  Patient presents with  . Follow-up foe mobility assessment for power wheechair    Pt states she is here because lask wk she gained almost 12 pounds. Pt thinks she is holding water real bad that she can barely walk.  . Leg Pain    X21yr  . Knee Pain    X  . Back Pain    HPI Cynthia Becker presents for follow up. She  has a past medical history of Anemia, Anxiety, Arthritis, Asthma, CN (constipation) (09/14/2014), DEGENERATIVE DISC DISEASE, LUMBOSACRAL SPINE W/RADICULOPATHY (11/01/2009), Depression, Diabetes ([place]), H/O dizziness, Headache(784.0), High blood pressure, HYPERLIPIDEMIA (02/13/2010), INSOMNIA (08/24/2008), Morbid obesity ([place]) (05/24/2008), and SVD (spontaneous vaginal delivery).    Cynthia Becker is a 49 y.o. female here for discussion regarding weight loss. She has noted a weight gain of approximately 30 pounds over the last 2 months. History of eating disorders: none.  Due to the increase weight gain she is now having problems ambulating.  She needs assistance with the power wheelchair for home activities of daily living such as cooking and cleaning and toileting. Obesity associated medical conditions: depression, diabetes mellitus, hyperlipidemia and hypertension. Obesity associated medications: none. Cardiovascular risk factors besides obesity: sedentary lifestyle and smoking/ tobacco exposure.  She feels like she has changed in the last month. She is in more pain. She is using the [employer] and Gabapentin. She is always in pain. She is having to back pain. This has not changed. She is having 8/10 pain in the legs. She having 6/10 back pain. She does having burning pain on the side of the legs. She admits that the  Gabapentin is effective until it wears off. She needs something additional for pain. She denies any falls in the last 6 weeks. She knows that the weight has a big factor in her life.   Diabetes Mellitus Patient presents for follow up of diabetes. Current symptoms include: paresthesia of the feet and polyuria. Symptoms have stabilized. Patient denies foot ulcerations, hyperglycemia, hypoglycemia , nausea and vomiting. Evaluation to date has included: hemoglobin A1C.  Home sugars: patient does not check sugars. Current treatment: none.  Past Medical History:  Diagnosis Date  . Anemia    iv iron treatment dec 2014/ see oncology for this last in dec 2014  . Anxiety   . Arthritis    knee, hands  . Asthma    seasonal related/ albuterol used when uri  . CN (constipation) 09/14/2014  . [place] DISEASE, LUMBOSACRAL SPINE W/RADICULOPATHY 11/01/2009   Qualifier: Diagnosis of  By: Jorene Minors, [place]    . Depression   . Diabetes ([place])   . H/O dizziness    Hx  . Headache(784.0)    otc med prn  . High blood pressure   . HYPERLIPIDEMIA 02/13/2010   Qualifier: Diagnosis of  By: Jorene Minors, Scott    . INSOMNIA 08/24/2008   Qualifier: Diagnosis of  By: Radene Ou MD, [place]    . Morbid obesity ([place]) 05/24/2008   Qualifier: Diagnosis of  By: Radene Ou MD, [place]    . SVD (spontaneous vaginal delivery)    x 3    Past Surgical History:  Procedure Laterality Date  . ABDOMINAL HYSTERECTOMY  03/13/2014   total   . BILATERAL SALPINGECTOMY Bilateral 04/12/2014   Procedure: BILATERAL SALPINGECTOMY;  Surgeon: Woodroe Mode, MD;  Location: [facility];  Service: Gynecology;  Laterality: Bilateral;  . CARPAL TUNNEL RELEASE  08/30/2011   Procedure: CARPAL TUNNEL RELEASE;  Surgeon: Wynonia Sours, MD;  Location: [facility];  Service: Orthopedics;  Laterality: Right;  . CHOLECYSTECTOMY  10/11   lap choli  . GALLBLADDER SURGERY  04/04/2010  . HYSTEROSCOPY N/A 02/08/2014   Procedure: HYSTEROSCOPY;   Surgeon: Woodroe Mode, MD;  Location: [facility];  Service: Gynecology;  Laterality: N/A;  . HYSTEROSCOPY WITH NOVASURE N/A 02/08/2014   Procedure: ATTEMPTED NOVASURE ABLATION;  Surgeon: Woodroe Mode, MD;  Location: [facility];  Service: Gynecology;  Laterality: N/A;  . INTRAUTERINE DEVICE INSERTION     07/19/2013  . SEPTOPLASTY N/A 09/08/2013   Procedure: SEPTOPLASTY;  Surgeon: Ruby Cola, MD;  Location: [facility];  Service: ENT;  Laterality: N/A;  . SPINE SURGERY    . TONSILLECTOMY AND ADENOIDECTOMY Bilateral 09/08/2013   Procedure: TONSILLECTOMY ;  Surgeon: Ruby Cola, MD;  Location: [facility];  Service: ENT;  Laterality: Bilateral;  . TUBAL LIGATION    . VAGINAL HYSTERECTOMY N/A 04/12/2014   Procedure: LAPAROSCOPIC ASSISTED VAGINAL HYSTERECTOMY;  Surgeon: Woodroe Mode, MD;  Location: [facility];  Service: Gynecology;  Laterality: N/A;  . WISDOM TOOTH EXTRACTION      Family History  Problem Relation Age of Onset  . Diabetes Mother   . Hypertension Father   . Heart disease Other   . Diabetes Other   . Hypertension Other   . Alcohol abuse Other   . Hypertension Brother   . Asthma Brother   . Hypertension Brother   . Asthma Brother   . COPD Maternal Grandmother   . Lung cancer Maternal Grandmother   . Asthma Paternal Grandmother   . Asthma Paternal Aunt     Social History   Socioeconomic History  . Marital status: Married    Spouse name: Not on file  . Number of children: 3  . Years of education: Not on file  . Highest education level: Not on file  Occupational History  . Not on file  Tobacco Use  . Smoking status: Current Some Day Smoker    Packs/day: 0.25    Types: Cigarettes  . Smokeless tobacco: Never Used  . Tobacco comment: 4-5 cigarettes a day  Vaping Use  . Vaping Use: Never used  Substance and Sexual Activity  . Alcohol use: No  . Drug use: No  . Sexual activity: Not on file  Other Topics Concern  . Not on file  Social History Narrative  . Not on file    Social Determinants of Health   Financial Resource Strain:   . Difficulty of Paying Living Expenses: Not on file  Food Insecurity:   . Worried About Charity fundraiser in the Last Year: Not on file  . Ran Out of Food in the Last Year: Not on file  Transportation Needs:   . Lack of Transportation (Medical): Not on file  . Lack of Transportation (Non-Medical): Not on file  Physical Activity:   . Days of Exercise per Week: Not on file  . Minutes of Exercise per Session: Not on file  Stress:   . Feeling of Stress : Not on file  Social Connections:   . Frequency of Communication with Friends and Family: Not on file  . Frequency of Social  Gatherings with Friends and Family: Not on file  . Attends Religious Services: Not on file  . Active Member of Clubs or Organizations: Not on file  . Attends Archivist Meetings: Not on file  . Marital Status: Not on file  Intimate Partner Violence:   . Fear of Current or Ex-Partner: Not on file  . Emotionally Abused: Not on file  . Physically Abused: Not on file  . Sexually Abused: Not on file    No facility-administered medications prior to visit.   Outpatient Medications Prior to Visit  Medication Sig Dispense Refill  . ACCU-CHEK FASTCLIX LANCETS MISC USE TO CHECK BLOOD SUGAR TWICE DAILY  11  . ACCU-CHEK GUIDE test strip USE 1 STRIP TWICE DAILY  11  . albuterol (PROVENTIL) (2.5 MG/3ML) 0.083% nebulizer solution USE 1 VIAL IN NEBULIZER EVERY 6 HOURS AS NEEDED FOR WHEEZING OR SHORTNESS OF BREATH 150 mL 0  . albuterol (VENTOLIN HFA) 108 (90 Base) MCG/ACT inhaler Inhale 2 puffs into the lungs every 6 (six) hours as needed for wheezing or shortness of breath.    . cetirizine (ZYRTEC ALLERGY) 10 MG tablet Take 1 tablet (10 mg total) by mouth daily. 30 tablet 11  . Fluticasone-Salmeterol (ADVAIR DISKUS) 250-50 MCG/DOSE AEPB Inhale 1 puff into the lungs 2 (two) times daily. 60 each 11  . isosorbide-hydrALAZINE (BIDIL) 20-37.5 MG tablet  Take 1 tablet by mouth in the morning and at bedtime. 180 tablet 2  . lidocaine (LIDODERM) 5 % Place 1 patch onto the skin daily as needed (pain).   5  . linaclotide (LINZESS) 290 MCG CAPS capsule Take 1 capsule (290 mcg total) by mouth daily before breakfast. 90 capsule 3  . montelukast (SINGULAIR) 10 MG tablet Take 1 tablet (10 mg total) by mouth at bedtime. 30 tablet 11  . omeprazole (PRILOSEC) 40 MG capsule Take 1 capsule (40 mg total) by mouth daily. 30 capsule 3  . Oxycodone HCl 10 MG TABS Take 10 mg by mouth every 6 (six) hours as needed (pain). Taking 3-4 times a day    . QUEtiapine Fumarate (SEROQUEL PO) Take 400 mg by mouth at bedtime. Taking for sleep    . spironolactone (ALDACTONE) 50 MG tablet Take 1 tablet (50 mg total) by mouth daily. 90 tablet 3  . fluticasone (FLONASE) 50 MCG/ACT nasal spray Place 2 sprays into both nostrils daily. (Patient not taking: Reported on 03/17/2020) 16 g 11  . ondansetron (ZOFRAN) 4 MG tablet Take 1 tablet (4 mg total) by mouth every 8 (eight) hours as needed for nausea or vomiting. (Patient not taking: Reported on 04/19/2020) 20 tablet 11  . gabapentin (NEURONTIN) 600 MG tablet Take 1 tablet (600 mg total) by mouth 3 (three) times daily. 180 tablet 0  . predniSONE (DELTASONE) 10 MG tablet Take 4 tabs po daily x 3 days; then 3 tabs daily x3 days; then 2 tabs daily x3 days; then 1 tab daily x 3 days; then stop (Patient not taking: Reported on 04/19/2020) 30 tablet 0    No Known Allergies  ROS Review of Systems  Respiratory: Positive for shortness of breath.   Cardiovascular: Negative for chest pain.  Gastrointestinal: Positive for nausea and vomiting. Negative for constipation.  Neurological: Positive for headaches. Negative for dizziness.      Objective:    Physical Exam Constitutional:      Appearance: She is obese.  HENT:     Head: Normocephalic and atraumatic.     Nose: Nose normal.  Mouth/Throat:     Mouth: Mucous membranes are  moist.  Cardiovascular:     Rate and Rhythm: Normal rate and regular rhythm.     Pulses: Normal pulses.     Heart sounds: Normal heart sounds.  Pulmonary:     Effort: Pulmonary effort is normal.     Breath sounds: Normal breath sounds.  Abdominal:     Palpations: Abdomen is soft.  Musculoskeletal:     Cervical back: Normal range of motion.     Right lower leg: No edema.     Left lower leg: No edema.     Comments: BUE and BLE 4-5/5 Limited ROM due to obesity   Skin:    General: Skin is warm and dry.     Capillary Refill: Capillary refill takes less than 2 seconds.  Neurological:     General: No focal deficit present.     Mental Status: She is alert and oriented to person, place, and time.  Psychiatric:        Mood and Affect: Mood normal.        Behavior: Behavior normal.        Thought Content: Thought content normal.        Judgment: Judgment normal.     BP (!) 154/95 (BP Location: Left Arm, Patient Position: Sitting, Cuff Size: Large)   Pulse 98   Temp 98.1 F (36.7 C)   Resp 17   Ht 5\' 5"  (1.651 m)   Wt (!) 475 lb 12.8 oz (215.8 kg)   LMP 02/13/2014   SpO2 94%   BMI 79.18 kg/m  There is not desaturation  Wt Readings from Last 3 Encounters:  04/24/20 (!) 469 lb (212.7 kg)  04/19/20 (!) 475 lb 12.8 oz (215.8 kg)  03/17/20 (!) 445 lb (201.9 kg)     Health Maintenance Due  Topic Date Due  . FOOT EXAM  Never done  . OPHTHALMOLOGY EXAM  Never done    There are no preventive care reminders to display for this patient.  Lab Results  Component Value Date   TSH 2.150 04/19/2020   Lab Results  Component Value Date   WBC 14.1 (H) 04/24/2020   HGB 14.7 04/24/2020   HCT 45.8 04/24/2020   MCV 79.1 (L) 04/24/2020   PLT 327 04/24/2020   Lab Results  Component Value Date   NA 140 01/17/2020   K 4.6 01/17/2020   CHLORIDE 107 04/07/2013   CO2 28 08/17/2019   GLUCOSE 111 (H) 01/17/2020   BUN 12 01/17/2020   CREATININE 0.80 01/17/2020   BILITOT 0.3 01/17/2020    ALKPHOS 87 01/17/2020   AST 20 01/17/2020   ALT 20 07/08/2019   PROT 7.8 01/17/2020   ALBUMIN 3.9 01/17/2020   CALCIUM 9.4 01/17/2020   ANIONGAP 9 07/08/2019   Lab Results  Component Value Date   CHOL 210 (H) 04/19/2020   Lab Results  Component Value Date   HDL 43 04/19/2020   Lab Results  Component Value Date   LDLCALC 151 (H) 04/19/2020   Lab Results  Component Value Date   TRIG 90 04/19/2020   Lab Results  Component Value Date   CHOLHDL 4.9 (H) 04/19/2020   Lab Results  Component Value Date   HGBA1C 7.3 (A) 04/19/2020   HGBA1C 7.3 04/19/2020   HGBA1C 7.3 (A) 04/19/2020   HGBA1C 7.3 (A) 04/19/2020      Assessment & Plan:   Problem List Items Addressed This Visit  Endocrine   Diabetes mellitus due to underlying condition with diabetic autonomic neuropathy, unspecified whether long term insulin use (HCC)   Relevant Medications   glipiZIDE (GLUCOTROL) 5 MG tablet     Other   Hemoglobin C trait ([place])    Other Visit Diagnoses    Type 2 diabetes mellitus without complication, unspecified whether long term insulin use ([place])    -  Primary Encourage compliance with current treatment regimen  Dose adjustment Started Glipizide Encourage regular CBG monitoring Encourage contacting office if excessive hyperglycemia and or hypoglycemia Lifestyle modification with healthy diet (fewer calories, more high fiber foods, whole grains and non-starchy vegetables, lower fat meat and fish, low-fat diary include healthy oils) regular exercise (physical activity) and weight loss Opthalmology exam discussed  Nutritional consult recommended Regular dental visits encouraged Home BP monitoring also encouraged goal <130/80     Relevant Medications   glipiZIDE (GLUCOTROL) 5 MG tablet   Other Relevant Orders   Urinalysis Dipstick (Completed)   POC Glucose (CBG) (Completed)   POC HgB A1c (Completed)   Lipid panel (Completed)   Morbid obesity with BMI of 70 and over, adult  ([place])    Obesity with BMI and comorbidities as noted above.  Discussed proper diet (low fat, low sodium, high fiber) with patient.   Discussed need for regular exercise (3 times per week, 20 minutes per session) with patient.     Relevant Medications   glipiZIDE (GLUCOTROL) 5 MG tablet   Other Relevant Orders   TSH (Completed)   Moderate persistent asthma, unspecified whether complicated       Bilateral lower extremity edema       Relevant Orders   Brain natriuretic peptide (Completed)   Pain in both lower extremities       Relevant Orders   Ambulatory referral to Pain Clinic   DG Knee 1-2 Views Left   DG Knee Complete 4 Views Right   Arthritis Panel (Completed)   Essential hypertension      Poor tolerance for ambulation Ms Schnoebelen is able to walk a short distance however this it not sustainable over a 5-10 walk due to her obesity. She is able to use a cane and a walker again but only for very short intervals due to her obesity. She can not maneuver at manual wheelchair on her own due to limited to range of motion,. Her husband does assist her with this however he works full time and pushing her around is a significant burden for him due to this small statue. She is home alone for greater than 8 hours. She needs to be able to get around. Ms Byrns is not the ideal candidate for the motorized scooter due her obesity and this not offering her adequate support. She may also have some difficulty raising her legs at times in order to safely get in and out of the scooter when left alone. This would increase her fall risk.   This will best be accomplished with at power wheelchair.      Meds ordered this encounter  Medications  . glipiZIDE (GLUCOTROL) 5 MG tablet    Sig: Take 1 tablet (5 mg total) by mouth 2 (two) times daily before a meal.    Dispense:  60 tablet    Refill:  11    Order Specific Question:   Supervising Provider    Answer:   Tresa Garter [[id number]]  . ibuprofen (ADVIL)  800 MG tablet    Sig: Take 1 tablet (800  mg total) by mouth every 8 (eight) hours as needed for moderate pain. Take on full stomach.    Dispense:  90 tablet    Refill:  0    Order Specific Question:   Supervising Provider    Answer:   Tresa Garter [id number]  . cyclobenzaprine (FLEXERIL) 5 MG tablet    Sig: Take 1 tablet (5 mg total) by mouth in the morning and at bedtime.    Dispense:  90 tablet    Refill:  0    Do not place this medication, or any other prescription from our practice, on "Automatic Refill". Patient may have prescription filled one day early if pharmacy is closed on scheduled refill date.    Order Specific Question:   Supervising Provider    Answer:   Tresa Garter [[id number]]    Follow-up: No follow-ups on file.    [name], NP

## 2020-04-20 ENCOUNTER — Telehealth: Payer: Self-pay | Admitting: Nurse Practitioner

## 2020-04-20 ENCOUNTER — Other Ambulatory Visit: Payer: Self-pay | Admitting: Nurse Practitioner

## 2020-04-20 DIAGNOSIS — M5126 Other intervertebral disc displacement, lumbar region: Secondary | ICD-10-CM

## 2020-04-20 DIAGNOSIS — M79604 Pain in right leg: Secondary | ICD-10-CM

## 2020-04-20 LAB — TSH: TSH: 2.15 u[IU]/mL (ref 0.450–4.500)

## 2020-04-20 LAB — LIPID PANEL
Chol/HDL Ratio: 4.9 ratio — ABNORMAL HIGH (ref 0.0–4.4)
Cholesterol, Total: 210 mg/dL — ABNORMAL HIGH (ref 100–199)
HDL: 43 mg/dL (ref >39)
LDL Chol Calc (NIH): 151 mg/dL — ABNORMAL HIGH (ref 0–99)
Triglycerides: 90 mg/dL (ref 0–149)
VLDL Cholesterol Cal: 16 mg/dL (ref 5–40)

## 2020-04-20 LAB — ARTHRITIS PANEL
Anti Nuclear Antibody (ANA): NEGATIVE
Rheumatoid fact SerPl-aCnc: <10 IU/mL (ref 0.0–13.9)
Sed Rate: 23 mm/hr (ref 0–40)
Uric Acid: 8 mg/dL — ABNORMAL HIGH (ref 2.6–6.2)

## 2020-04-20 LAB — BRAIN NATRIURETIC PEPTIDE: BNP: <2.5 pg/mL (ref 0.0–100.0)

## 2020-04-20 MED ORDER — CYCLOBENZAPRINE HCL 5 MG PO TABS
5.0000 mg | ORAL_TABLET | Freq: Two times a day (BID) | ORAL | 0 refills | Status: DC
Start: 2020-04-20 — End: 2020-06-28

## 2020-04-20 MED ORDER — GABAPENTIN 600 MG PO TABS: 600.0000 mg | ORAL_TABLET | Freq: Three times a day (TID) | ORAL | 0 refills | Status: DC

## 2020-04-20 MED ORDER — IBUPROFEN 800 MG PO TABS
800.0000 mg | ORAL_TABLET | Freq: Three times a day (TID) | ORAL | 0 refills | Status: DC | PRN
Start: 2020-04-20 — End: 2020-04-29

## 2020-04-20 NOTE — Telephone Encounter (Signed)
I sent a mychart message to the pt I had the orders in but did not sign them  They are sent now

## 2020-04-24 ENCOUNTER — Emergency Department (HOSPITAL_COMMUNITY): Payer: Medicaid Other

## 2020-04-24 ENCOUNTER — Other Ambulatory Visit: Payer: Self-pay

## 2020-04-24 ENCOUNTER — Inpatient Hospital Stay (HOSPITAL_COMMUNITY)
Admission: EM | Admit: 2020-04-24 | Discharge: 2020-04-29 | DRG: 193 | Disposition: A | Discharge status: Home Health | Payer: Medicaid Other | Attending: Internal Medicine | Admitting: Internal Medicine

## 2020-04-24 ENCOUNTER — Encounter (HOSPITAL_COMMUNITY): Payer: Self-pay

## 2020-04-24 DIAGNOSIS — Z20822 Contact with and (suspected) exposure to covid-19: Secondary | ICD-10-CM | POA: Diagnosis present

## 2020-04-24 DIAGNOSIS — F419 Anxiety disorder, unspecified: Secondary | ICD-10-CM | POA: Diagnosis present

## 2020-04-24 DIAGNOSIS — E1165 Type 2 diabetes mellitus with hyperglycemia: Secondary | ICD-10-CM | POA: Diagnosis present

## 2020-04-24 DIAGNOSIS — E669 Obesity, unspecified: Secondary | ICD-10-CM | POA: Diagnosis not present

## 2020-04-24 DIAGNOSIS — Z8249 Family history of ischemic heart disease and other diseases of the circulatory system: Secondary | ICD-10-CM

## 2020-04-24 DIAGNOSIS — I313 Pericardial effusion (noninflammatory): Secondary | ICD-10-CM | POA: Diagnosis present

## 2020-04-24 DIAGNOSIS — Z825 Family history of asthma and other chronic lower respiratory diseases: Secondary | ICD-10-CM | POA: Diagnosis not present

## 2020-04-24 DIAGNOSIS — I272 Pulmonary hypertension, unspecified: Secondary | ICD-10-CM | POA: Diagnosis present

## 2020-04-24 DIAGNOSIS — I5033 Acute on chronic diastolic (congestive) heart failure: Secondary | ICD-10-CM | POA: Diagnosis present

## 2020-04-24 DIAGNOSIS — I110 Hypertensive heart disease with heart failure: Secondary | ICD-10-CM | POA: Diagnosis present

## 2020-04-24 DIAGNOSIS — G4733 Obstructive sleep apnea (adult) (pediatric): Secondary | ICD-10-CM | POA: Diagnosis present

## 2020-04-24 DIAGNOSIS — J45901 Unspecified asthma with (acute) exacerbation: Secondary | ICD-10-CM | POA: Diagnosis present

## 2020-04-24 DIAGNOSIS — Z79899 Other long term (current) drug therapy: Secondary | ICD-10-CM | POA: Diagnosis not present

## 2020-04-24 DIAGNOSIS — J9601 Acute respiratory failure with hypoxia: Secondary | ICD-10-CM

## 2020-04-24 DIAGNOSIS — I340 Nonrheumatic mitral (valve) insufficiency: Secondary | ICD-10-CM | POA: Diagnosis not present

## 2020-04-24 DIAGNOSIS — I10 Essential (primary) hypertension: Secondary | ICD-10-CM

## 2020-04-24 DIAGNOSIS — R0600 Dyspnea, unspecified: Secondary | ICD-10-CM | POA: Diagnosis not present

## 2020-04-24 DIAGNOSIS — J45909 Unspecified asthma, uncomplicated: Secondary | ICD-10-CM | POA: Diagnosis present

## 2020-04-24 DIAGNOSIS — E785 Hyperlipidemia, unspecified: Secondary | ICD-10-CM | POA: Diagnosis not present

## 2020-04-24 DIAGNOSIS — I5032 Chronic diastolic (congestive) heart failure: Secondary | ICD-10-CM

## 2020-04-24 DIAGNOSIS — Z6841 Body Mass Index (BMI) 40.0 and over, adult: Secondary | ICD-10-CM | POA: Diagnosis not present

## 2020-04-24 DIAGNOSIS — I2609 Other pulmonary embolism with acute cor pulmonale: Secondary | ICD-10-CM | POA: Diagnosis present

## 2020-04-24 DIAGNOSIS — F17210 Nicotine dependence, cigarettes, uncomplicated: Secondary | ICD-10-CM | POA: Diagnosis present

## 2020-04-24 DIAGNOSIS — R0602 Shortness of breath: Secondary | ICD-10-CM

## 2020-04-24 DIAGNOSIS — F32A Depression, unspecified: Secondary | ICD-10-CM | POA: Diagnosis present

## 2020-04-24 DIAGNOSIS — Z833 Family history of diabetes mellitus: Secondary | ICD-10-CM | POA: Diagnosis not present

## 2020-04-24 DIAGNOSIS — J9600 Acute respiratory failure, unspecified whether with hypoxia or hypercapnia: Secondary | ICD-10-CM | POA: Diagnosis present

## 2020-04-24 DIAGNOSIS — J159 Unspecified bacterial pneumonia: Secondary | ICD-10-CM | POA: Diagnosis present

## 2020-04-24 DIAGNOSIS — E0843 Diabetes mellitus due to underlying condition with diabetic autonomic (poly)neuropathy: Secondary | ICD-10-CM | POA: Insufficient documentation

## 2020-04-24 LAB — CBC
HCT: 45.8 % (ref 36.0–46.0)
Hemoglobin: 14.7 g/dL (ref 12.0–15.0)
MCH: 25.4 pg — ABNORMAL LOW (ref 26.0–34.0)
MCHC: 32.1 g/dL (ref 30.0–36.0)
MCV: 79.1 fL — ABNORMAL LOW (ref 80.0–100.0)
Platelets: 327 10*3/uL (ref 150–400)
RBC: 5.79 MIL/uL — ABNORMAL HIGH (ref 3.87–5.11)
RDW: 16 % — ABNORMAL HIGH (ref 11.5–15.5)
WBC: 14.1 10*3/uL — ABNORMAL HIGH (ref 4.0–10.5)
nRBC: 0.7 % — ABNORMAL HIGH (ref 0.0–0.2)

## 2020-04-24 LAB — BASIC METABOLIC PANEL
Anion gap: 10 (ref 5–15)
BUN: 11 mg/dL (ref 6–20)
CO2: 32 mmol/L (ref 22–32)
Calcium: 8.7 mg/dL — ABNORMAL LOW (ref 8.9–10.3)
Chloride: 97 mmol/L — ABNORMAL LOW (ref 98–111)
Creatinine, Ser: 0.82 mg/dL (ref 0.44–1.00)
GFR, Estimated: >60 mL/min (ref >60)
Glucose, Bld: 134 mg/dL — ABNORMAL HIGH (ref 70–99)
Potassium: 4.3 mmol/L (ref 3.5–5.1)
Sodium: 139 mmol/L (ref 135–145)

## 2020-04-24 LAB — BRAIN NATRIURETIC PEPTIDE: B Natriuretic Peptide: 85.2 pg/mL (ref 0.0–100.0)

## 2020-04-24 LAB — TROPONIN I (HIGH SENSITIVITY)
Troponin I (High Sensitivity): 29 ng/L — ABNORMAL HIGH (ref <18)
Troponin I (High Sensitivity): 28 ng/L — ABNORMAL HIGH (ref <18)

## 2020-04-24 LAB — GLUCOSE, CAPILLARY: Glucose-Capillary: 144 mg/dL — ABNORMAL HIGH (ref 70–99)

## 2020-04-24 LAB — RESPIRATORY PANEL BY RT PCR (FLU A&B, COVID)
Influenza A by PCR: NEGATIVE
Influenza B by PCR: NEGATIVE
SARS Coronavirus 2 by RT PCR: NEGATIVE

## 2020-04-24 MED ORDER — LINACLOTIDE 145 MCG PO CAPS
290.0000 ug | ORAL_CAPSULE | Freq: Every day | ORAL | Status: DC
  Administered 2020-04-25 – 2020-04-29 (×5): 290 ug via ORAL
  Filled 2020-04-24 (×5): qty 2

## 2020-04-24 MED ORDER — OXYCODONE HCL 5 MG PO TABS
10.0000 mg | ORAL_TABLET | Freq: Four times a day (QID) | ORAL | Status: DC | PRN
  Administered 2020-04-24 – 2020-04-29 (×11): 10 mg via ORAL
  Filled 2020-04-24 (×11): qty 2

## 2020-04-24 MED ORDER — LORATADINE 10 MG PO TABS
10.0000 mg | ORAL_TABLET | Freq: Every day | ORAL | Status: DC
  Administered 2020-04-25 – 2020-04-29 (×5): 10 mg via ORAL
  Filled 2020-04-24 (×5): qty 1

## 2020-04-24 MED ORDER — FLUTICASONE FUROATE-VILANTEROL 200-25 MCG/INH IN AEPB
1.0000 | INHALATION_SPRAY | Freq: Every day | RESPIRATORY_TRACT | Status: DC
  Administered 2020-04-25 – 2020-04-29 (×5): 1 via RESPIRATORY_TRACT
  Filled 2020-04-24: qty 28

## 2020-04-24 MED ORDER — INSULIN ASPART 100 UNIT/ML ~~LOC~~ SOLN
0.0000 [IU] | Freq: Three times a day (TID) | SUBCUTANEOUS | Status: DC
  Administered 2020-04-25: 3 [IU] via SUBCUTANEOUS
  Administered 2020-04-25 – 2020-04-26 (×2): 4 [IU] via SUBCUTANEOUS
  Administered 2020-04-26 – 2020-04-27 (×2): 3 [IU] via SUBCUTANEOUS
  Administered 2020-04-27 – 2020-04-28 (×3): 4 [IU] via SUBCUTANEOUS
  Filled 2020-04-24: qty 0.2

## 2020-04-24 MED ORDER — GABAPENTIN 300 MG PO CAPS
600.0000 mg | ORAL_CAPSULE | Freq: Three times a day (TID) | ORAL | Status: DC
  Administered 2020-04-24 – 2020-04-29 (×14): 600 mg via ORAL
  Filled 2020-04-24 (×4): qty 2
  Filled 2020-04-24: qty 6
  Filled 2020-04-24 (×9): qty 2

## 2020-04-24 MED ORDER — ISOSORB DINITRATE-HYDRALAZINE 20-37.5 MG PO TABS
1.0000 | ORAL_TABLET | Freq: Two times a day (BID) | ORAL | Status: DC
  Administered 2020-04-24 – 2020-04-29 (×10): 1 via ORAL
  Filled 2020-04-24 (×11): qty 1

## 2020-04-24 MED ORDER — MONTELUKAST SODIUM 10 MG PO TABS
10.0000 mg | ORAL_TABLET | Freq: Every day | ORAL | Status: DC
  Administered 2020-04-24 – 2020-04-28 (×5): 10 mg via ORAL
  Filled 2020-04-24 (×5): qty 1

## 2020-04-24 MED ORDER — INSULIN ASPART 100 UNIT/ML ~~LOC~~ SOLN
0.0000 [IU] | Freq: Every day | SUBCUTANEOUS | Status: DC
  Filled 2020-04-24: qty 0.05

## 2020-04-24 MED ORDER — ALBUTEROL SULFATE (2.5 MG/3ML) 0.083% IN NEBU
2.5000 mg | INHALATION_SOLUTION | Freq: Four times a day (QID) | RESPIRATORY_TRACT | Status: DC
  Administered 2020-04-24: 2.5 mg via RESPIRATORY_TRACT
  Filled 2020-04-24: qty 3

## 2020-04-24 MED ORDER — SODIUM CHLORIDE 0.9 % IV SOLN
2.0000 g | Freq: Once | INTRAVENOUS | Status: AC
  Administered 2020-04-24: 2 g via INTRAVENOUS
  Filled 2020-04-24: qty 20

## 2020-04-24 MED ORDER — QUETIAPINE FUMARATE 200 MG PO TABS
400.0000 mg | ORAL_TABLET | Freq: Every day | ORAL | Status: DC
  Administered 2020-04-24 – 2020-04-28 (×5): 400 mg via ORAL
  Filled 2020-04-24 (×5): qty 2

## 2020-04-24 MED ORDER — HEPARIN SODIUM (PORCINE) 5000 UNIT/ML IJ SOLN
5000.0000 [IU] | Freq: Three times a day (TID) | INTRAMUSCULAR | Status: DC
  Administered 2020-04-24 – 2020-04-29 (×14): 5000 [IU] via SUBCUTANEOUS
  Filled 2020-04-24 (×14): qty 1

## 2020-04-24 MED ORDER — IOHEXOL 350 MG/ML SOLN
100.0000 mL | Freq: Once | INTRAVENOUS | Status: AC | PRN
  Administered 2020-04-24: 100 mL via INTRAVENOUS

## 2020-04-24 MED ORDER — IPRATROPIUM BROMIDE HFA 17 MCG/ACT IN AERS
2.0000 | INHALATION_SPRAY | Freq: Once | RESPIRATORY_TRACT | Status: AC
  Administered 2020-04-24: 2 via RESPIRATORY_TRACT
  Filled 2020-04-24: qty 12.9

## 2020-04-24 MED ORDER — PANTOPRAZOLE SODIUM 40 MG PO TBEC
40.0000 mg | DELAYED_RELEASE_TABLET | Freq: Every day | ORAL | Status: DC
  Administered 2020-04-24 – 2020-04-29 (×6): 40 mg via ORAL
  Filled 2020-04-24 (×6): qty 1

## 2020-04-24 MED ORDER — ALBUTEROL SULFATE (2.5 MG/3ML) 0.083% IN NEBU
2.5000 mg | INHALATION_SOLUTION | Freq: Three times a day (TID) | RESPIRATORY_TRACT | Status: DC
  Administered 2020-04-25: 2.5 mg via RESPIRATORY_TRACT
  Filled 2020-04-24 (×2): qty 3

## 2020-04-24 MED ORDER — SODIUM CHLORIDE 0.9 % IV SOLN
1.0000 g | Freq: Every day | INTRAVENOUS | Status: DC
  Administered 2020-04-25 – 2020-04-29 (×5): 1 g via INTRAVENOUS
  Filled 2020-04-24 (×5): qty 1

## 2020-04-24 MED ORDER — ATORVASTATIN CALCIUM 10 MG PO TABS
10.0000 mg | ORAL_TABLET | Freq: Every day | ORAL | Status: DC
  Administered 2020-04-24 – 2020-04-29 (×6): 10 mg via ORAL
  Filled 2020-04-24 (×6): qty 1

## 2020-04-24 MED ORDER — SPIRONOLACTONE 25 MG PO TABS
50.0000 mg | ORAL_TABLET | Freq: Every day | ORAL | Status: DC
  Administered 2020-04-25 – 2020-04-29 (×5): 50 mg via ORAL
  Filled 2020-04-24 (×5): qty 2

## 2020-04-24 MED ORDER — SODIUM CHLORIDE 0.9 % IV SOLN
500.0000 mg | Freq: Once | INTRAVENOUS | Status: AC
  Administered 2020-04-24: 500 mg via INTRAVENOUS
  Filled 2020-04-24: qty 500

## 2020-04-24 MED ORDER — SODIUM CHLORIDE 0.9 % IV SOLN
500.0000 mg | INTRAVENOUS | Status: DC
  Filled 2020-04-24: qty 500

## 2020-04-24 MED ORDER — SALINE SPRAY 0.65 % NA SOLN
1.0000 | NASAL | Status: DC | PRN
  Filled 2020-04-24: qty 44

## 2020-04-24 MED ORDER — ALBUTEROL SULFATE HFA 108 (90 BASE) MCG/ACT IN AERS
4.0000 | INHALATION_SPRAY | Freq: Once | RESPIRATORY_TRACT | Status: AC
  Administered 2020-04-24: 4 via RESPIRATORY_TRACT
  Filled 2020-04-24: qty 6.7

## 2020-04-24 NOTE — H&P (Signed)
TRH H&P    Patient Demographics:    Cynthia Becker, is a 49 y.o. female  MRN: [id number]  DOB - 1970/08/26  Admit Date - 04/24/2020  Referring MD/NP/PA: Langston Masker  Outpatient Primary MD for the patient is [name], NP  Patient coming from: Home Chief complaint- Congestion   HPI:    Cynthia Becker  is a 49 y.o. female, with history of morbid obesity, insomnia, hyperlipidemia, diabetes mellitus type 2, asthma, hypertension, and more presents to the ED with a chief complaint of chest congestion.  Patient reports that she has had chest and head congestion as well as cold chills for the last 2 or 3 days.  She reports a cold chills are getting better.  She reports that she has been dyspneic as well, and that has been getting progressively worse.  She reports exerting herself makes the shortness of breath worse, improved with rest but does not totally resolve.  She has had a cough productive of green/brown sputum.  She complains of chest tightness and wheezing as well.  She has been using her albuterol nebulizer once per day, it has not resolved her symptoms.  She does report she had a flu shot 6 days ago.  Patient is a current smoker.  In the ED Temperature 98.1, heart rate 72, respiratory 18, blood pressure 140/100 With blood cell count 14.1, hemoglobin 14.7 CHEM panel is unremarkable with the glucose of 134 BNP is 85.2 Troponins 29, 28 Covid and flu negative CT angios shows bilateral airspace opacities consistent with multifocal pneumonia, axillary lymphadenopathy, no PE Chest x-ray shows mild central pulmonary vascular congestion Albuterol, Zithromax, ceftriaxone given in the ED Admission requested due to hypoxia and new oxygen requirement management    Review of systems:    In addition to the HPI above,  Admits to subjective fever and chills No Headache, admits to mild hearing loss-muffled, tinnitus No  problems swallowing food or Liquids, Admits to chest tightness, cough, shortness of breath No Abdominal pain, No Nausea or Vomiting, no change in bowel habits No Blood in stool or Urine, No dysuria, No new skin rashes or bruises, No new joints pains-aches,  No new weakness, tingling, numbness in any extremity, Recent weight gain 60 pounds No polyuria, polydypsia or polyphagia, No significant Mental Stressors.  All other systems reviewed and are negative.    Past History of the following :    Past Medical History:  Diagnosis Date  . Anemia    iv iron treatment dec 2014/ see oncology for this last in dec 2014  . Anxiety   . Arthritis    knee, hands  . Asthma    seasonal related/ albuterol used when uri  . CN (constipation) 09/14/2014  . [place] DISEASE, LUMBOSACRAL SPINE W/RADICULOPATHY 11/01/2009   Qualifier: Diagnosis of  By: Jorene Minors, [place]    . Depression   . Diabetes ([place])   . H/O dizziness    Hx  . Headache(784.0)    otc med prn  . High blood pressure   . HYPERLIPIDEMIA 02/13/2010  Qualifier: Diagnosis of  By: Jorene Minors, [place]    . INSOMNIA 08/24/2008   Qualifier: Diagnosis of  By: Radene Ou MD, [place]    . Morbid obesity ([place]) 05/24/2008   Qualifier: Diagnosis of  By: Radene Ou MD, [place]    . SVD (spontaneous vaginal delivery)    x 3      Past Surgical History:  Procedure Laterality Date  . ABDOMINAL HYSTERECTOMY  03/13/2014   total   . BILATERAL SALPINGECTOMY Bilateral 04/12/2014   Procedure: BILATERAL SALPINGECTOMY;  Surgeon: Woodroe Mode, MD;  Location: [facility];  Service: Gynecology;  Laterality: Bilateral;  . CARPAL TUNNEL RELEASE  08/30/2011   Procedure: CARPAL TUNNEL RELEASE;  Surgeon: Wynonia Sours, MD;  Location: [facility];  Service: Orthopedics;  Laterality: Right;  . CHOLECYSTECTOMY  10/11   lap choli  . GALLBLADDER SURGERY  04/04/2010  . HYSTEROSCOPY N/A 02/08/2014   Procedure: HYSTEROSCOPY;  Surgeon: Woodroe Mode,  MD;  Location: [facility];  Service: Gynecology;  Laterality: N/A;  . HYSTEROSCOPY WITH NOVASURE N/A 02/08/2014   Procedure: ATTEMPTED NOVASURE ABLATION;  Surgeon: Woodroe Mode, MD;  Location: [facility];  Service: Gynecology;  Laterality: N/A;  . INTRAUTERINE DEVICE INSERTION     07/19/2013  . SEPTOPLASTY N/A 09/08/2013   Procedure: SEPTOPLASTY;  Surgeon: Ruby Cola, MD;  Location: [facility];  Service: ENT;  Laterality: N/A;  . SPINE SURGERY    . TONSILLECTOMY AND ADENOIDECTOMY Bilateral 09/08/2013   Procedure: TONSILLECTOMY ;  Surgeon: Ruby Cola, MD;  Location: [facility];  Service: ENT;  Laterality: Bilateral;  . TUBAL LIGATION    . VAGINAL HYSTERECTOMY N/A 04/12/2014   Procedure: LAPAROSCOPIC ASSISTED VAGINAL HYSTERECTOMY;  Surgeon: Woodroe Mode, MD;  Location: [facility];  Service: Gynecology;  Laterality: N/A;  . WISDOM TOOTH EXTRACTION        Social History:      Social History   Tobacco Use  . Smoking status: Current Some Day Smoker    Packs/day: 0.25    Types: Cigarettes  . Smokeless tobacco: Never Used  . Tobacco comment: 4-5 cigarettes a day  Substance Use Topics  . Alcohol use: No       Family History :     Family History  Problem Relation Age of Onset  . Diabetes Mother   . Hypertension Father   . Heart disease Other   . Diabetes Other   . Hypertension Other   . Alcohol abuse Other   . Hypertension Brother   . Asthma Brother   . Hypertension Brother   . Asthma Brother   . COPD Maternal Grandmother   . Lung cancer Maternal Grandmother   . Asthma Paternal Grandmother   . Asthma Paternal Aunt       Home Medications:   Prior to Admission medications   Medication Sig Start Date End Date Taking? Authorizing Provider  albuterol (PROVENTIL) (2.5 MG/3ML) 0.083% nebulizer solution USE 1 VIAL IN NEBULIZER EVERY 6 HOURS AS NEEDED FOR WHEEZING OR SHORTNESS OF BREATH Patient taking differently: Take 2.5 mg by nebulization every 6 (six) hours as needed for wheezing or  shortness of breath.  03/24/20  Yes [name], NP  albuterol (VENTOLIN HFA) 108 (90 Base) MCG/ACT inhaler Inhale 2 puffs into the lungs every 6 (six) hours as needed for wheezing or shortness of breath.   Yes [provider]  cetirizine (ZYRTEC ALLERGY) 10 MG tablet Take 1 tablet (10 mg total) by mouth daily. 12/02/19  Yes  Noemi Chapel P, DO  chlorhexidine (PERIDEX) 0.12 % solution Use as directed 5 mLs in the mouth or throat 2 (two) times daily as needed (mouth pain).  04/15/20  Yes [provider]  cyclobenzaprine (FLEXERIL) 5 MG tablet Take 1 tablet (5 mg total) by mouth in the morning and at bedtime. 04/20/20  Yes King, Diona Foley, NP  Fluticasone-Salmeterol (ADVAIR DISKUS) 250-50 MCG/DOSE AEPB Inhale 1 puff into the lungs 2 (two) times daily. 12/02/19  Yes Noemi Chapel P, DO  gabapentin (NEURONTIN) 600 MG tablet Take 1 tablet (600 mg total) by mouth 3 (three) times daily. 04/20/20 06/19/20 Yes [name], NP  glipiZIDE (GLUCOTROL) 5 MG tablet Take 1 tablet (5 mg total) by mouth 2 (two) times daily before a meal. 04/19/20 04/19/21 Yes King, Diona Foley, NP  ibuprofen (ADVIL) 800 MG tablet Take 1 tablet (800 mg total) by mouth every 8 (eight) hours as needed for moderate pain. Take on full stomach. 04/20/20 07/19/20 Yes King, Diona Foley, NP  isosorbide-hydrALAZINE (BIDIL) 20-37.5 MG tablet Take 1 tablet by mouth in the morning and at bedtime. 01/14/20  Yes Patwardhan, Reynold Bowen, MD  linaclotide (LINZESS) 290 MCG CAPS capsule Take 1 capsule (290 mcg total) by mouth daily before breakfast. 01/17/20 01/16/21 Yes King, Diona Foley, NP  montelukast (SINGULAIR) 10 MG tablet Take 1 tablet (10 mg total) by mouth at bedtime. 12/02/19  Yes Noemi Chapel P, DO  omeprazole (PRILOSEC) 20 MG capsule Take 20 mg by mouth at bedtime.  04/01/20  Yes [provider]  Oxycodone HCl 10 MG TABS Take 10 mg by mouth every 6 (six) hours as needed (pain). Taking 3-4 times a day   Yes [provider]  QUEtiapine (SEROQUEL) 400 MG tablet Take 400 mg by mouth at bedtime.   Yes [provider]  spironolactone (ALDACTONE) 50 MG tablet Take 1 tablet (50 mg total) by mouth daily. 01/14/20  Yes Patwardhan, Reynold Bowen, MD  ACCU-CHEK FASTCLIX LANCETS MISC USE TO CHECK BLOOD SUGAR TWICE DAILY 03/23/18   [provider]  ACCU-CHEK GUIDE test strip USE 1 [place] 03/30/18   [provider]  fluticasone (FLONASE) 50 MCG/ACT nasal spray Place 2 sprays into both nostrils daily. Patient not taking: Reported on 03/17/2020 12/02/19   Noemi Chapel P, DO  omeprazole (PRILOSEC) 40 MG capsule Take 1 capsule (40 mg total) by mouth daily. Patient not taking: Reported on 04/24/2020 03/17/20   Martyn Ehrich, NP  ondansetron (ZOFRAN) 4 MG tablet Take 1 tablet (4 mg total) by mouth every 8 (eight) hours as needed for nausea or vomiting. Patient not taking: Reported on 04/19/2020 03/06/20   [name], NP     Allergies:    No Known Allergies   Physical Exam:   Vitals  Blood pressure (!) 158/80, pulse 90, temperature 98.1 F (36.7 C), temperature source Oral, resp. rate (!) 24, height 5\' 5"  (1.651 m), weight (!) 212.7 kg, last menstrual period 02/13/2014, SpO2 96 %.  1.  General: Lying supine in bed in no acute distress  2. Psychiatric: Mood and behavior normal for situation, alert and oriented x3  3. Neurologic: Cranial nerves II through XII intact, no focal deficit on limited exam, at baseline  4. HEENMT:  Head is atraumatic, normocephalic, pupils reactive to light, neck is supple, trachea is midline, mucous membranes are moist  5. Respiratory : Wheezing in the lower lung fields bilaterally, no rales or rhonchi, no cyanosis or clubbing  6.  Cardiovascular : Heart rate is normal at the time of my exam, rhythm is regular, no murmurs rubs or gallops  7. Gastrointestinal:  Abdomen is obese, soft, nondistended, nontender to palpation  8. Skin:  Venous stasis  changes of the lower extremities bilaterally, no acute lesions on limited exam  9.Musculoskeletal:  Trace pitting edema in the lower extremities bilaterally, no acute deformity, no calf tenderness    Data Review:    CBC Recent Labs  Lab 04/24/20 1320  WBC 14.1*  HGB 14.7  HCT 45.8  PLT 327  MCV 79.1*  MCH 25.4*  MCHC 32.1  RDW 16.0*   ------------------------------------------------------------------------------------------------------------------  Results for orders placed or performed during the hospital encounter of 04/24/20 (from the past 48 hour(s))  Basic metabolic panel     Status: Abnormal   Collection Time: 04/24/20  1:20 PM  Result Value Ref Range   Sodium 139 135 - 145 mmol/L   Potassium 4.3 3.5 - 5.1 mmol/L   Chloride 97 (L) 98 - 111 mmol/L   CO2 32 22 - 32 mmol/L   Glucose, Bld 134 (H) 70 - 99 mg/dL    Comment: Glucose reference range applies only to samples taken after fasting for at least 8 hours.   BUN 11 6 - 20 mg/dL   Creatinine, Ser 0.82 0.44 - 1.00 mg/dL   Calcium 8.7 (L) 8.9 - 10.3 mg/dL   GFR, Estimated >60 >60 mL/min    Comment: (NOTE) Calculated using the CKD-EPI Creatinine Equation (2021)    Anion gap 10 5 - 15    Comment: Performed at [facility], [place] [address], [place], [place] [address]  CBC     Status: Abnormal   Collection Time: 04/24/20  1:20 PM  Result Value Ref Range   WBC 14.1 (H) 4.0 - 10.5 K/uL   RBC 5.79 (H) 3.87 - 5.11 MIL/uL   Hemoglobin 14.7 12.0 - 15.0 g/dL   HCT 45.8 36 - 46 %   MCV 79.1 (L) 80.0 - 100.0 fL   MCH 25.4 (L) 26.0 - 34.0 pg   MCHC 32.1 30.0 - 36.0 g/dL   RDW 16.0 (H) 11.5 - 15.5 %   Platelets 327 150 - 400 K/uL   nRBC 0.7 (H) 0.0 - 0.2 %    Comment: Performed at [facility], [place] [address] [place], [place] [address]  Troponin I (High Sensitivity)     Status: Abnormal   Collection Time: 04/24/20  1:20 PM  Result Value Ref Range   Troponin I (High Sensitivity) 29  (H) <18 ng/L    Comment: (NOTE) Elevated high sensitivity troponin I (hsTnI) values and significant  changes across serial measurements may suggest ACS but many other  chronic and acute conditions are known to elevate hsTnI results.  Refer to the "Links" section for chest pain algorithms and additional  guidance. Performed at [facility], [place] [address] [place], [place] [address]   Brain natriuretic peptide     Status: None   Collection Time: 04/24/20  1:20 PM  Result Value Ref Range   B Natriuretic Peptide 85.2 0.0 - 100.0 pg/mL    Comment: Performed at [facility], [place] [address], [place], [place] [address]  Respiratory Panel by RT PCR (Flu A&B, Covid) - Nasopharyngeal Swab     Status: None   Collection Time: 04/24/20  1:39 PM   Specimen: Nasopharyngeal Swab  Result Value Ref Range   SARS Coronavirus 2 by RT  PCR NEGATIVE NEGATIVE    Comment: (NOTE) SARS-CoV-2 target nucleic acids are NOT DETECTED.  The SARS-CoV-2 RNA is generally detectable in upper respiratoy specimens during the acute phase of infection. The lowest concentration of SARS-CoV-2 viral copies this assay can detect is 131 copies/mL. A negative result does not preclude SARS-Cov-2 infection and should not be used as the sole basis for treatment or other patient management decisions. A negative result may occur with  improper specimen collection/handling, submission of specimen other than nasopharyngeal swab, presence of viral mutation(s) within the areas targeted by this assay, and inadequate number of viral copies (<131 copies/mL). A negative result must be combined with clinical observations, patient history, and epidemiological information. The expected result is Negative.  Fact Sheet for Patients:  PinkCheek.be  Fact Sheet for Healthcare Providers:  GravelBags.it  This test is no t yet approved or cleared by  the [place] FDA and  has been authorized for detection and/or diagnosis of SARS-CoV-2 by FDA under an Emergency Use Authorization (EUA). This EUA will remain  in effect (meaning this test can be used) for the duration of the COVID-19 declaration under Section 564(b)(1) of the Act, 21 U.S.C. section 360bbb-3(b)(1), unless the authorization is terminated or revoked sooner.     Influenza A by PCR NEGATIVE NEGATIVE   Influenza B by PCR NEGATIVE NEGATIVE    Comment: (NOTE) The Xpert Xpress SARS-CoV-2/FLU/RSV assay is intended as an aid in  the diagnosis of influenza from Nasopharyngeal swab specimens and  should not be used as a sole basis for treatment. Nasal washings and  aspirates are unacceptable for Xpert Xpress SARS-CoV-2/FLU/RSV  testing.  Fact Sheet for Patients: PinkCheek.be  Fact Sheet for Healthcare Providers: GravelBags.it  This test is not yet approved or cleared by the [place] FDA and  has been authorized for detection and/or diagnosis of SARS-CoV-2 by  FDA under an Emergency Use Authorization (EUA). This EUA will remain  in effect (meaning this test can be used) for the duration of the  Covid-19 declaration under Section 564(b)(1) of the Act, 21  U.S.C. section 360bbb-3(b)(1), unless the authorization is  terminated or revoked. Performed at [facility], [place] [address] [place], [place] [address]   Troponin I (High Sensitivity)     Status: Abnormal   Collection Time: 04/24/20  3:57 PM  Result Value Ref Range   Troponin I (High Sensitivity) 28 (H) <18 ng/L    Comment: (NOTE) Elevated high sensitivity troponin I (hsTnI) values and significant  changes across serial measurements may suggest ACS but many other  chronic and acute conditions are known to elevate hsTnI results.  Refer to the "Links" section for chest pain algorithms and additional  guidance. Performed at [facility], [place] [address], [place], [place] [address]     Chemistries  Recent Labs  Lab 04/24/20 1320  NA 139  K 4.3  CL 97*  CO2 32  GLUCOSE 134*  BUN 11  CREATININE 0.82  CALCIUM 8.7*   ------------------------------------------------------------------------------------------------------------------  ------------------------------------------------------------------------------------------------------------------ GFR: Estimated Creatinine Clearance: 154.6 mL/min (by C-G formula based on SCr of 0.82 mg/dL). Liver Function Tests: No results for input(s): AST, ALT, ALKPHOS, BILITOT, PROT, ALBUMIN in the last 168 hours. No results for input(s): LIPASE, AMYLASE in the last 168 hours. No results for input(s): AMMONIA in the last 168 hours. Coagulation Profile: No results for input(s): INR, PROTIME in the last 168 hours. Cardiac Enzymes: No results for input(s): CKTOTAL, CKMB, CKMBINDEX, TROPONINI in the  last 168 hours. BNP (last 3 results) Recent Labs    10/18/19 1600  PROBNP 12   HbA1C: No results for input(s): HGBA1C in the last 72 hours. CBG: No results for input(s): GLUCAP in the last 168 hours. Lipid Profile: No results for input(s): CHOL, HDL, LDLCALC, TRIG, CHOLHDL, LDLDIRECT in the last 72 hours. Thyroid Function Tests: No results for input(s): TSH, T4TOTAL, FREET4, T3FREE, THYROIDAB in the last 72 hours. Anemia Panel: No results for input(s): VITAMINB12, FOLATE, FERRITIN, TIBC, IRON, RETICCTPCT in the last 72 hours.  --------------------------------------------------------------------------------------------------------------- Urine analysis:    Component Value Date/Time   COLORURINE YELLOW 02/21/2012 1647   APPEARANCEUR CLOUDY (A) 02/21/2012 1647   LABSPEC 1.021 02/21/2012 1647   PHURINE 6.0 02/21/2012 1647   GLUCOSEU NEGATIVE 02/21/2012 1647   HGBUR NEGATIVE 02/21/2012 1647   HGBUR negative 08/02/2010 1448   BILIRUBINUR neg 04/19/2020 1454     KETONESUR NEGATIVE 02/21/2012 1647   PROTEINUR Negative 04/19/2020 1454   PROTEINUR NEGATIVE 02/21/2012 1647   UROBILINOGEN 0.2 04/19/2020 1454   UROBILINOGEN 1.0 02/21/2012 1647   NITRITE neg 04/19/2020 1454   NITRITE NEGATIVE 02/21/2012 1647   LEUKOCYTESUR Negative 04/19/2020 1454      Imaging Results:    CT Angio Chest PE W and/or Wo Contrast  Result Date: 04/24/2020 CLINICAL DATA:  Chest pain for several days EXAM: CT ANGIOGRAPHY CHEST WITH CONTRAST TECHNIQUE: Multidetector CT imaging of the chest was performed using the standard protocol during bolus administration of intravenous contrast. Multiplanar CT image reconstructions and MIPs were obtained to evaluate the vascular anatomy. CONTRAST:  15mL OMNIPAQUE IOHEXOL 350 MG/ML SOLN COMPARISON:  Chest x-ray from earlier in the same day. FINDINGS: Cardiovascular: Thoracic aorta demonstrates a normal branching pattern. The overall degree of opacification is limited although no aneurysmal dilatation or dissection is seen. Mild cardiac enlargement is seen. The pulmonary artery is well visualized centrally although peripheral opacification is somewhat limited. No large central pulmonary embolism is seen. Mediastinum/Nodes: Thoracic inlet is within normal limits. Scattered small mediastinal and hilar lymph nodes are noted likely reactive in nature given the changes in the lungs. The esophagus as visualized is within normal limits mildly prominent lymph nodes are noted in the right axilla but with normal fatty hila likely reactive in nature. Lungs/Pleura: The lungs are well aerated bilaterally. Patchy airspace opacity is noted throughout both lungs consistent with multifocal pneumonia. These changes are most prominent in the right middle, right lower and left upper lobes. No sizable effusion is seen. Upper Abdomen: Visualized upper abdomen appears within normal limits. Musculoskeletal: Degenerative changes of the thoracic spine are seen. No acute bony  abnormality is noted. Review of the MIP images confirms the above findings. IMPRESSION: Bilateral airspace opacities  consistent with multifocal pneumonia. Somewhat limited opacification of the pulmonary artery related to the timing of the contrast bolus. No large central embolus is seen. Mildly prominent axillary lymph nodes particularly on the right. Correlate with site of recent flu shot injection. Electronically Signed   By: Inez Catalina M.D.   On: 04/24/2020 17:39   DG Chest Port 1 View  Result Date: 04/24/2020 CLINICAL DATA:  Chest pain. EXAM: PORTABLE CHEST 1 VIEW COMPARISON:  July 08, 2019. FINDINGS: Stable cardiomediastinal silhouette. Mild central pulmonary vascular congestion is noted. No pneumothorax or pleural effusion is noted. No definite consolidative process is noted. Bony thorax is unremarkable. IMPRESSION: Mild central pulmonary vascular congestion. Electronically Signed   By: Marijo Conception M.D.   On: 04/24/2020 13:28  My personal review of EKG: Rhythm sinus tach, Rate 102 /min, QTc 458 ,no Acute ST changes   Assessment & Plan:    Active Problems:   Acute respiratory failure ([place])   1. Acute hypoxic respiratory failure 1. Oxygen sats in the 84% at presentation 2. Normalized with 2 L nasal cannula 3. Likely secondary to pneumonia 4. CTA did not show PE 5. Troponin stable 6. Wean off O2 as tolerated 7. Continue albuterol nebs 8. Monitor on telemetry 2. Community-acquired pneumonia 1. Leukocytosis 14.1, pneumonia seen on CT, subjective fever, cough, shortness of breath, chest congestion 2. Continue Zithromax and Rocephin 3. Sputum cultures pending 4. Strep and Legionella urine antigens pending 5. Continue to monitor 3. Elevated troponin 1. Stable troponins at 29, 28 2. EKG without ischemic changes 4. Diabetes mellitus type 2 1. Hold glipizide 2. Sliding scale coverage 3. Carb modified diet 4. Last hemoglobin A1c was 7.3 5. Morbid obesity 1. Discussed diet  extensively 2. Patient reports she wants to lose weight just does not know how 3. To consider nutritional educator in the morning 6. Hypertension 1. Continue home medications 7. Hyperlipidemia 1. Last LDL was 127 2. Low-dose statin ordered 8. Tobacco dependence 1. Counseled on cessation 2. Patient trying to quit    DVT Prophylaxis-   Heparin- SCDs   AM Labs Ordered, also please review Full Orders  Family Communication: No family at bedside  Code Status: Full  Admission status: Inpatient :The appropriate admission status for this patient is INPATIENT. Inpatient status is judged to be reasonable and necessary in order to provide the required intensity of service to ensure the patient's safety. The patient's presenting symptoms, physical exam findings, and initial radiographic and laboratory data in the context of their chronic comorbidities is felt to place them at high risk for further clinical deterioration. Furthermore, it is not anticipated that the patient will be medically stable for discharge from the hospital within 2 midnights of admission. The following factors support the admission status of inpatient.     The patient's presenting symptoms include chest and head congestion The worrisome physical exam findings include hypoxia down to 84% The initial radiographic and laboratory data are worrisome because of multifocal pneumonia seen on CTA, leukocytosis 14.1 The chronic co-morbidities include diabetes mellitus type 2, hypertension, hyperlipidemia, morbid obesity, asthma       * I certify that at the point of admission it is my clinical judgment that the patient will require inpatient hospital care spanning beyond 2 midnights from the point of admission due to high intensity of service, high risk for further deterioration and high frequency of surveillance required.*  Time spent in minutes : [place]

## 2020-04-24 NOTE — ED Notes (Signed)
Report called to [facility]

## 2020-04-24 NOTE — ED Notes (Signed)
Called to give report, nurses states she will have to call me back

## 2020-04-24 NOTE — ED Provider Notes (Signed)
Pt signed out to me by Dr. Tomi Bamberger.  Briefly this is a 49 year old female presenitng to the Ed with chest pain, congestion, body aches x 3 days, after getting a flu shot as an outpatient.    Pt has hx of obesity and OSA, wears oxygen at night "as needed."  Labs here trop mild elevation 29.  ECG shows NSR without acute ischemia.  BNP 85.  She has some mild vascular congestion on xray chest.  She is not vaccinated for Covid.  Pending delta trop, Covid test, and CT PE  Update - CT PE demonstrated bilateral PNA.  Patient remains on 2L [place] O2.  Given her comorbidities, she was admitted to the hospital for monitoring and PNA treatment.   Wyvonnia Dusky, MD 04/25/20 1136

## 2020-04-24 NOTE — ED Provider Notes (Signed)
[facility] DEPT Provider Note   CSN: [id number] Arrival date & time: 04/24/20  1220     History Chief Complaint  Patient presents with   Chest Pain   Shortness of Breath   Generalized Body Aches   Nasal Congestion    Cynthia Becker is a 49 y.o. female.  HPI   Patient presented to the ED for evaluation of nasal congestion cough, chills, body aches and chest congestion.  Patient states the symptoms started several days ago.  She feels like it all started after she got a flu shot.  She has not been coughing a lot but feels like she has a lot of congestion in her chest that she cannot get up.  She has not had any fevers.  She denies any known Covid exposures.  Patient has not been vaccinated for Covid.  Patient does have a history of obstructive sleep apnea.  She does use CPAP at night and also oxygen.  Past Medical History:  Diagnosis Date   Anemia    iv iron treatment dec 2014/ see oncology for this last in dec 2014   Anxiety    Arthritis    knee, hands   Asthma    seasonal related/ albuterol used when uri   CN (constipation) 09/14/2014   DEGENERATIVE DISC DISEASE, LUMBOSACRAL SPINE W/RADICULOPATHY 11/01/2009   Qualifier: Diagnosis of  By: Jorene Minors, Scott     Depression    Diabetes ([place])    H/O dizziness    Hx   Headache(784.0)    otc med prn   High blood pressure    HYPERLIPIDEMIA 02/13/2010   Qualifier: Diagnosis of  By: Jorene Minors, Scott     INSOMNIA 08/24/2008   Qualifier: Diagnosis of  By: Radene Ou MD, [place]     Morbid obesity ([place]) 05/24/2008   Qualifier: Diagnosis of  By: Radene Ou MD, [place]     SVD (spontaneous vaginal delivery)    x 3    Patient Active Problem List   Diagnosis Date Noted   Diabetes mellitus due to underlying condition with diabetic autonomic neuropathy, unspecified whether long term insulin use ([place]) 04/24/2020   Asthmatic bronchitis 03/21/2020   Nocturnal hypoxia 03/21/2020   OSA  (obstructive sleep apnea) 12/22/2019   Shortness of breath 08/09/2019   No-show for appointment 06/30/2019   Morbid obesity with BMI of 60.0-69.9, adult ([place]) 09/14/2014   Iron deficiency anemia due to chronic blood loss 09/14/2014   CN (constipation) 09/14/2014   Postoperative state 04/12/2014   Uterine fibroid 03/17/2014   DUB (dysfunctional uterine bleeding) 01/27/2014   Iron deficiency anemia due to chronic blood loss 04/09/2013   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   Menometrorrhagia 04/09/2013   Left hand pain 09/18/2011   High blood pressure    ANEMIA, IRON DEFICIENCY 08/06/2010   CHOLECYSTECTOMY, HX OF 04/09/2010   HYPERLIPIDEMIA 02/13/2010   DEGENERATIVE DISC DISEASE, LUMBOSACRAL SPINE W/RADICULOPATHY 11/01/2009   BACK PAIN, LUMBAR, WITH RADICULOPATHY 10/30/2009   TRIGGER FINGER 02/03/2009   Carpal tunnel syndrome 12/21/2008   Headache(784.0) 11/09/2008   DEPRESSION 10/04/2008   URI 08/24/2008   INSOMNIA 08/24/2008   ASTHMA 06/06/2008   Morbid obesity ([place]) 05/24/2008   ESSENTIAL HYPERTENSION 05/24/2008    Past Surgical History:  Procedure Laterality Date   ABDOMINAL HYSTERECTOMY  03/13/2014   total    BILATERAL SALPINGECTOMY Bilateral 04/12/2014   Procedure: BILATERAL SALPINGECTOMY;  Surgeon: Woodroe Mode, MD;  Location: [facility];  Service: Gynecology;  Laterality: Bilateral;  CARPAL TUNNEL RELEASE  08/30/2011   Procedure: CARPAL TUNNEL RELEASE;  Surgeon: Wynonia Sours, MD;  Location: [facility];  Service: Orthopedics;  Laterality: Right;   CHOLECYSTECTOMY  10/11   lap choli   GALLBLADDER SURGERY  04/04/2010   HYSTEROSCOPY N/A 02/08/2014   Procedure: HYSTEROSCOPY;  Surgeon: Woodroe Mode, MD;  Location: [facility];  Service: Gynecology;  Laterality: N/A;   HYSTEROSCOPY WITH NOVASURE N/A 02/08/2014   Procedure: ATTEMPTED NOVASURE ABLATION;  Surgeon: Woodroe Mode, MD;  Location: [facility];  Service: Gynecology;  Laterality:  N/A;   INTRAUTERINE DEVICE INSERTION     07/19/2013   SEPTOPLASTY N/A 09/08/2013   Procedure: SEPTOPLASTY;  Surgeon: Ruby Cola, MD;  Location: [facility];  Service: ENT;  Laterality: N/A;   SPINE SURGERY     TONSILLECTOMY AND ADENOIDECTOMY Bilateral 09/08/2013   Procedure: TONSILLECTOMY ;  Surgeon: Ruby Cola, MD;  Location: [facility];  Service: ENT;  Laterality: Bilateral;   TUBAL LIGATION     VAGINAL HYSTERECTOMY N/A 04/12/2014   Procedure: LAPAROSCOPIC ASSISTED VAGINAL HYSTERECTOMY;  Surgeon: Woodroe Mode, MD;  Location: [facility];  Service: Gynecology;  Laterality: N/A;   WISDOM TOOTH EXTRACTION       OB History    Gravida  3   Para  3   Term  3   Preterm  0   AB  0   Living  3     SAB  0   TAB  0   Ectopic  0   Multiple  0   Live Births              Family History  Problem Relation Age of Onset   Diabetes Mother    Hypertension Father    Heart disease Other    Diabetes Other    Hypertension Other    Alcohol abuse Other    Hypertension Brother    Asthma Brother    Hypertension Brother    Asthma Brother    COPD Maternal Grandmother    Lung cancer Maternal Grandmother    Asthma Paternal Grandmother    Asthma Paternal Aunt     Social History   Tobacco Use   Smoking status: Current Some Day Smoker    Packs/day: 0.25    Types: Cigarettes   Smokeless tobacco: Never Used   Tobacco comment: 4-5 cigarettes a day  Vaping Use   Vaping Use: Never used  Substance Use Topics   Alcohol use: No   Drug use: No    Home Medications Prior to Admission medications   Medication Sig Start Date End Date Taking? Authorizing Provider  ACCU-CHEK FASTCLIX LANCETS MISC USE TO CHECK BLOOD SUGAR TWICE DAILY 03/23/18   [provider]  ACCU-CHEK GUIDE test strip USE 1 [place] 03/30/18   [provider]  albuterol (PROVENTIL) (2.5 MG/3ML) 0.083% nebulizer solution USE 1 VIAL IN NEBULIZER EVERY 6 HOURS AS NEEDED FOR  WHEEZING OR SHORTNESS OF BREATH 03/24/20   [name], NP  albuterol (VENTOLIN HFA) 108 (90 Base) MCG/ACT inhaler Inhale 2 puffs into the lungs every 6 (six) hours as needed for wheezing or shortness of breath.    [provider]  cetirizine (ZYRTEC ALLERGY) 10 MG tablet Take 1 tablet (10 mg total) by mouth daily. 12/02/19   Julian Hy, DO  cyclobenzaprine (FLEXERIL) 5 MG tablet Take 1 tablet (5 mg total) by mouth in the morning and at bedtime. 04/20/20   Dionisio David  M, NP  fluticasone (FLONASE) 50 MCG/ACT nasal spray Place 2 sprays into both nostrils daily. Patient not taking: Reported on 03/17/2020 12/02/19   Noemi Chapel P, DO  Fluticasone-Salmeterol (ADVAIR DISKUS) 250-50 MCG/DOSE AEPB Inhale 1 puff into the lungs 2 (two) times daily. 12/02/19   Julian Hy, DO  gabapentin (NEURONTIN) 600 MG tablet Take 1 tablet (600 mg total) by mouth 3 (three) times daily. 04/20/20 06/19/20  [name], NP  glipiZIDE (GLUCOTROL) 5 MG tablet Take 1 tablet (5 mg total) by mouth 2 (two) times daily before a meal. 04/19/20 04/19/21  [name], NP  ibuprofen (ADVIL) 800 MG tablet Take 1 tablet (800 mg total) by mouth every 8 (eight) hours as needed for moderate pain. Take on full stomach. 04/20/20 07/19/20  [name], NP  isosorbide-hydrALAZINE (BIDIL) 20-37.5 MG tablet Take 1 tablet by mouth in the morning and at bedtime. 01/14/20   Patwardhan, Manish J, MD  lidocaine (LIDODERM) 5 % Place 1 patch onto the skin daily as needed (pain).  03/22/18   [provider]  linaclotide Rolan Lipa) 290 MCG CAPS capsule Take 1 capsule (290 mcg total) by mouth daily before breakfast. 01/17/20 01/16/21  [name], NP  montelukast (SINGULAIR) 10 MG tablet Take 1 tablet (10 mg total) by mouth at bedtime. 12/02/19   Julian Hy, DO  omeprazole (PRILOSEC) 40 MG capsule Take 1 capsule (40 mg total) by mouth daily. 03/17/20   Martyn Ehrich, NP  ondansetron (ZOFRAN) 4 MG tablet Take 1  tablet (4 mg total) by mouth every 8 (eight) hours as needed for nausea or vomiting. Patient not taking: Reported on 04/19/2020 03/06/20   [name], NP  Oxycodone HCl 10 MG TABS Take 10 mg by mouth every 6 (six) hours as needed (pain). Taking 3-4 times a day    [provider]  QUEtiapine Fumarate (SEROQUEL PO) Take 400 mg by mouth at bedtime. Taking for sleep    [provider]  spironolactone (ALDACTONE) 50 MG tablet Take 1 tablet (50 mg total) by mouth daily. 01/14/20   Patwardhan, Reynold Bowen, MD    Allergies    Patient has no known allergies.  Review of Systems   Review of Systems  All other systems reviewed and are negative.   Physical Exam Updated Vital Signs BP (!) 124/98    Pulse 93    Temp 98.1 F (36.7 C) (Oral)    Resp (!) 21    Ht 1.651 m (5\' 5" )    Wt (!) 212.7 kg    LMP 02/13/2014    SpO2 99%    BMI 78.05 kg/m   Physical Exam Vitals and nursing note reviewed.  Constitutional:      General: She is not in acute distress.    Appearance: She is well-developed. She is obese.     Comments: Morbidly obese  HENT:     Head: Normocephalic and atraumatic.     Right Ear: External ear normal.     Left Ear: External ear normal.  Eyes:     General: No scleral icterus.       Right eye: No discharge.        Left eye: No discharge.     Conjunctiva/sclera: Conjunctivae normal.  Neck:     Trachea: No tracheal deviation.  Cardiovascular:     Rate and Rhythm: Normal rate and regular rhythm.  Pulmonary:     Effort: Pulmonary effort is normal. No respiratory distress.  Breath sounds: No stridor. Rhonchi present. No wheezing or rales.  Abdominal:     General: Bowel sounds are normal. There is no distension.     Palpations: Abdomen is soft.     Tenderness: There is no abdominal tenderness. There is no guarding or rebound.  Musculoskeletal:        General: No tenderness.     Cervical back: Neck supple.     Right lower leg: Edema present.     Left lower  leg: Edema present.  Skin:    General: Skin is warm and dry.     Findings: No rash.  Neurological:     Mental Status: She is alert.     Cranial Nerves: No cranial nerve deficit (no facial droop, extraocular movements intact, no slurred speech).     Sensory: No sensory deficit.     Motor: No abnormal muscle tone or seizure activity.     Coordination: Coordination normal.     ED Results / Procedures / Treatments   Labs (all labs ordered are listed, but only abnormal results are displayed) Labs Reviewed  RESPIRATORY PANEL BY RT PCR (FLU A&B, COVID)  BASIC METABOLIC PANEL  CBC  BRAIN NATRIURETIC PEPTIDE  TROPONIN I (HIGH SENSITIVITY)    EKG EKG Interpretation  Date/Time:  Monday April 24 2020 12:34:39 EDT Ventricular Rate:  102 PR Interval:    QRS Duration: 79 QT Interval:  351 QTC Calculation: 458 R Axis:   74 Text Interpretation: Sinus tachycardia Low voltage, precordial leads 12 Lead; [place] Since last tracing rate faster Confirmed by Dorie Rank [phone]) on 04/24/2020 1:29:00 PM   Radiology DG Chest Port 1 View  Result Date: 04/24/2020 CLINICAL DATA:  Chest pain. EXAM: PORTABLE CHEST 1 VIEW COMPARISON:  July 08, 2019. FINDINGS: Stable cardiomediastinal silhouette. Mild central pulmonary vascular congestion is noted. No pneumothorax or pleural effusion is noted. No definite consolidative process is noted. Bony thorax is unremarkable. IMPRESSION: Mild central pulmonary vascular congestion. Electronically Signed   By: Marijo Conception M.D.   On: 04/24/2020 13:28    Procedures .Critical Care Performed by: Dorie Rank, MD Authorized by: Dorie Rank, MD   Critical care provider statement:    Critical care time (minutes):  45   Critical care was time spent personally by me on the following activities:  Discussions with consultants, evaluation of patient's response to treatment, examination of patient, ordering and performing treatments and interventions, ordering and  review of laboratory studies, ordering and review of radiographic studies, pulse oximetry, re-evaluation of patient's condition, obtaining history from patient or surrogate and review of old charts   (including critical care time)  Medications Ordered in ED Medications  albuterol (VENTOLIN HFA) 108 (90 Base) MCG/ACT inhaler 4 puff (has no administration in time range)  ipratropium (ATROVENT HFA) inhaler 2 puff (has no administration in time range)    ED Course  I have reviewed the triage vital signs and the nursing notes.  Pertinent labs & imaging results that were available during my care of the patient were reviewed by me and considered in my medical decision making (see chart for details).  Clinical Course as of Apr 25 753  [facility] Apr 24, 2020  1445 Labs notable for increased white blood cell count.   [JK]  1446 Troponin elevated at 29   [JK]  1446 Chest x-ray suggest pulmonary vascular congestion   [JK]  1500 BNP normal   [JK]  1501 With patient's hypoxia and elevated troponin will perform CT  angiogram to rule out pulmonary embolism   [JK]  1859 Admitted to hospitalist   [MT]    Clinical Course User Index [JK] Dorie Rank, MD [MT] Langston Masker Carola Rhine, MD   MDM Rules/Calculators/A&P                          Pt presented with dyspnea.  Hx of OSA and uses oxygen at home but not regularly during day.  ?new oxygen requirement or more baseline.  No pna on xray.  Trop elevated and vasc congestion but no increase bnp.  ?chf.  ?occult infection  Plan on CT to rule out pe.  Covid test ordered.   Care turned over to Dr Langston Masker. Final Clinical Impression(s) / ED Diagnoses Final diagnoses:  SOB (shortness of breath)      Dorie Rank, MD 04/25/20 [phone]

## 2020-04-24 NOTE — ED Notes (Signed)
Patient back from CT.

## 2020-04-24 NOTE — ED Triage Notes (Signed)
Patient c/o chest pain, nasal and chest congestion, chills, body aches x 3 days. Patient states symptoms started after getting a flu shot.

## 2020-04-25 DIAGNOSIS — E785 Hyperlipidemia, unspecified: Secondary | ICD-10-CM

## 2020-04-25 DIAGNOSIS — G4733 Obstructive sleep apnea (adult) (pediatric): Secondary | ICD-10-CM

## 2020-04-25 DIAGNOSIS — I10 Essential (primary) hypertension: Secondary | ICD-10-CM

## 2020-04-25 DIAGNOSIS — E669 Obesity, unspecified: Secondary | ICD-10-CM

## 2020-04-25 DIAGNOSIS — J45901 Unspecified asthma with (acute) exacerbation: Secondary | ICD-10-CM

## 2020-04-25 DIAGNOSIS — J159 Unspecified bacterial pneumonia: Principal | ICD-10-CM

## 2020-04-25 LAB — CBC WITH DIFFERENTIAL/PLATELET
Abs Immature Granulocytes: 0.11 10*3/uL — ABNORMAL HIGH (ref 0.00–0.07)
Basophils Absolute: 0.1 10*3/uL (ref 0.0–0.1)
Basophils Relative: 1 %
Eosinophils Absolute: 0.3 10*3/uL (ref 0.0–0.5)
Eosinophils Relative: 3 %
HCT: 41.9 % (ref 36.0–46.0)
Hemoglobin: 13.5 g/dL (ref 12.0–15.0)
Immature Granulocytes: 1 %
Lymphocytes Relative: 30 %
Lymphs Abs: 3.2 10*3/uL (ref 0.7–4.0)
MCH: 25.4 pg — ABNORMAL LOW (ref 26.0–34.0)
MCHC: 32.2 g/dL (ref 30.0–36.0)
MCV: 78.8 fL — ABNORMAL LOW (ref 80.0–100.0)
Monocytes Absolute: 0.8 10*3/uL (ref 0.1–1.0)
Monocytes Relative: 7 %
Neutro Abs: 6.4 10*3/uL (ref 1.7–7.7)
Neutrophils Relative %: 58 %
Platelets: 291 10*3/uL (ref 150–400)
RBC: 5.32 MIL/uL — ABNORMAL HIGH (ref 3.87–5.11)
RDW: 15.4 % (ref 11.5–15.5)
WBC: 10.9 10*3/uL — ABNORMAL HIGH (ref 4.0–10.5)
nRBC: 0.6 % — ABNORMAL HIGH (ref 0.0–0.2)

## 2020-04-25 LAB — COMPREHENSIVE METABOLIC PANEL
ALT: 18 U/L (ref 0–44)
AST: 17 U/L (ref 15–41)
Albumin: 3 g/dL — ABNORMAL LOW (ref 3.5–5.0)
Alkaline Phosphatase: 67 U/L (ref 38–126)
Anion gap: 8 (ref 5–15)
BUN: 10 mg/dL (ref 6–20)
CO2: 32 mmol/L (ref 22–32)
Calcium: 8.5 mg/dL — ABNORMAL LOW (ref 8.9–10.3)
Chloride: 98 mmol/L (ref 98–111)
Creatinine, Ser: 0.74 mg/dL (ref 0.44–1.00)
GFR, Estimated: >60 mL/min (ref >60)
Glucose, Bld: 118 mg/dL — ABNORMAL HIGH (ref 70–99)
Potassium: 4 mmol/L (ref 3.5–5.1)
Sodium: 138 mmol/L (ref 135–145)
Total Bilirubin: 0.6 mg/dL (ref 0.3–1.2)
Total Protein: 7.1 g/dL (ref 6.5–8.1)

## 2020-04-25 LAB — EXPECTORATED SPUTUM ASSESSMENT W GRAM STAIN, RFLX TO RESP C

## 2020-04-25 LAB — GLUCOSE, CAPILLARY
Glucose-Capillary: 122 mg/dL — ABNORMAL HIGH (ref 70–99)
Glucose-Capillary: 176 mg/dL — ABNORMAL HIGH (ref 70–99)
Glucose-Capillary: 145 mg/dL — ABNORMAL HIGH (ref 70–99)
Glucose-Capillary: 108 mg/dL — ABNORMAL HIGH (ref 70–99)

## 2020-04-25 LAB — HIV ANTIBODY (ROUTINE TESTING W REFLEX): HIV Screen 4th Generation wRfx: NONREACTIVE

## 2020-04-25 LAB — MAGNESIUM: Magnesium: 2.2 mg/dL (ref 1.7–2.4)

## 2020-04-25 LAB — STREP PNEUMONIAE URINARY ANTIGEN: Strep Pneumo Urinary Antigen: NEGATIVE

## 2020-04-25 LAB — TSH: TSH: 1.766 u[IU]/mL (ref 0.350–4.500)

## 2020-04-25 MED ORDER — AZITHROMYCIN 250 MG PO TABS
500.0000 mg | ORAL_TABLET | Freq: Every day | ORAL | Status: DC
  Administered 2020-04-25 – 2020-04-29 (×5): 500 mg via ORAL
  Filled 2020-04-25 (×5): qty 2

## 2020-04-25 MED ORDER — ORAL CARE MOUTH RINSE
15.0000 mL | Freq: Two times a day (BID) | OROMUCOSAL | Status: DC
  Administered 2020-04-25 – 2020-04-29 (×5): 15 mL via OROMUCOSAL

## 2020-04-25 MED ORDER — ALBUTEROL SULFATE HFA 108 (90 BASE) MCG/ACT IN AERS
1.0000 | INHALATION_SPRAY | RESPIRATORY_TRACT | Status: DC | PRN
  Administered 2020-04-26: 1 via RESPIRATORY_TRACT
  Filled 2020-04-25 (×2): qty 6.7

## 2020-04-25 NOTE — Progress Notes (Signed)
PROGRESS NOTE    Cynthia Becker  OAC:[id number] DOB: 01/09/1971 DOA: 04/24/2020 PCP: [name], NP    Brief Narrative:  Cynthia Becker was admitted to the hospital with a working diagnosis of acute hypoxic respiratory failure due to community-acquired pneumonia.  49 year old female with obesity class III, dyslipidemia, asthma, hypertension, type 2 diabetes mellitus who presented with chest congestion.  Symptoms present for about 2 to 3 days associated with chills, dyspnea and productive cough.  Positive chest tightness and wheezing.  On her initial physical examination blood pressure 158/80, heart rate 90, temperature 98.1, respiratory rate 24, oxygen saturation 96% on supplemental oxygen. Her lungs had bilateral wheezing, no rhonchi or rails, heart S1-S2, present rhythmic, soft abdomen, no lower extremity edema. Sodium 139, potassium 4.3, chloride 97, bicarb 32, glucose 134, BUN 11, creatinine 0.82, white count 14.1, hemoglobin 14.7, hematocrit 45.8, platelets 327.  Chest radiograph, hypopenetrated, interstitial infiltrate left upper lobe and right lower lobe.  CT chest with bilateral airspace opacities no pulmonary embolism.  Assessment & Plan:   Principal Problem:   Community acquired bacterial pneumonia Active Problems:   Dyslipidemia   Depression   Asthma   OSA (obstructive sleep apnea)   Acute respiratory failure (HCC)   Essential hypertension   Class 3 obesity   1. Acute bilateral left upper lobe and right lower lobe community acquired pneumonia/ complicated with acute hypoxic respiratory failure.  Wbc trending down to 10,9, pending sputum culture report.   Continue antibiotic therapy with ceftriaxone and azithromycin. Supplemental 02 per [place] to target 02 saturation more than 92%.  Bronchodilator therapy, airway clearing techniques and antitussive agents.   2. Asthma exacerbation. Patient with faint wheezing on examination, 0,3 eosinophils per cell count differential.    Continue bronchodilator therapy, add inhaled steroids, will hold on systemic steroids for now.  Continue oxymetry monitoring.  Encourage mobility, out of bed to chair tid with meals and PT and OT consultation.   3. Obesity class 3, OSA. BMI more than 40, will continue outpatient follow up.   4. T2DM/ dysplidemia. Continue glucose control and monitoring with insulin sliding scale, patient is tolerating po well.  Fasting glucose this am is 118 mg/dl.  Continue with statin therapy.   5. HTN. Continue blood pressure control with isosorbide and hydralazine. Continue with spironolactone 50 mg daily.     Patient continue to be at high risk for worsening respiratory failure   Status is: Inpatient  Remains inpatient appropriate because:IV treatments appropriate due to intensity of illness or inability to take PO   Dispo: The patient is from: Home              Anticipated d/c is to: Home              Anticipated d/c date is: 3 days              Patient currently is not medically stable to d/c.   DVT prophylaxis: Enoxaparin   Code Status:   full  Family Communication:  I spoke with patient's husband at the bedside, we talked in detail about patient's condition, plan of care and prognosis and all questions were addressed.      Antimicrobials:   Ceftriaxone and azithromycin     Subjective: Patient continue to have dyspnea and cough, improved from yesterday but not yet back to baseline, continue to have worse symptoms with exertion.   Objective: Vitals:   04/25/20 0119 04/25/20 0521 04/25/20 0717 04/25/20 0855  BP: 128/82 (!) 152/101  140/87  Pulse: 93 85  82  Resp: 18 20  15   Temp: 97.7 F (36.5 C) 97.8 F (36.6 C)  97.9 F (36.6 C)  TempSrc: Oral Oral  Oral  SpO2: 95% 95% 94% 92%  Weight:      Height:        Intake/Output Summary (Last 24 hours) at 04/25/2020 1327 Last data filed at 04/25/2020 4967 Gross per 24 hour  Intake 250 ml  Output 500 ml  Net -250 ml    Filed Weights   04/24/20 1240 04/24/20 1409 04/24/20 2100  Weight: (!) 212.7 kg (!) 212.7 kg (!) 212.7 kg    Examination:   General: positive dyspnea at rest.  Neurology: Awake and alert, non focal  E ENT: no pallor, no icterus, oral mucosa moist Cardiovascular: No JVD. S1-S2 present, rhythmic, no gallops, rubs, or murmurs. No lower extremity edema. Pulmonary: positive breath sounds bilaterally, mild expiratroy wheezing, scattered rales. Gastrointestinal. Abdomen soft and non tender Skin. No rashes Musculoskeletal: no joint deformities     Data Reviewed: I have personally reviewed following labs and imaging studies  CBC: Recent Labs  Lab 04/24/20 1320 04/25/20 0510  WBC 14.1* 10.9*  NEUTROABS  --  6.4  HGB 14.7 13.5  HCT 45.8 41.9  MCV 79.1* 78.8*  PLT 327 591   Basic Metabolic Panel: Recent Labs  Lab 04/24/20 1320 04/25/20 0510  NA 139 138  K 4.3 4.0  CL 97* 98  CO2 32 32  GLUCOSE 134* 118*  BUN 11 10  CREATININE 0.82 0.74  CALCIUM 8.7* 8.5*  MG  --  2.2   GFR: Estimated Creatinine Clearance: 158.4 mL/min (by C-G formula based on SCr of 0.74 mg/dL). Liver Function Tests: Recent Labs  Lab 04/25/20 0510  AST 17  ALT 18  ALKPHOS 67  BILITOT 0.6  PROT 7.1  ALBUMIN 3.0*   No results for input(s): LIPASE, AMYLASE in the last 168 hours. No results for input(s): AMMONIA in the last 168 hours. Coagulation Profile: No results for input(s): INR, PROTIME in the last 168 hours. Cardiac Enzymes: No results for input(s): CKTOTAL, CKMB, CKMBINDEX, TROPONINI in the last 168 hours. BNP (last 3 results) Recent Labs    10/18/19 1600  PROBNP 12   HbA1C: No results for input(s): HGBA1C in the last 72 hours. CBG: Recent Labs  Lab 04/24/20 2121 04/25/20 0749 04/25/20 1201  GLUCAP 144* 122* 176*   Lipid Profile: No results for input(s): CHOL, HDL, LDLCALC, TRIG, CHOLHDL, LDLDIRECT in the last 72 hours. Thyroid Function Tests: Recent Labs     04/25/20 0510  TSH 1.766   Anemia Panel: No results for input(s): VITAMINB12, FOLATE, FERRITIN, TIBC, IRON, RETICCTPCT in the last 72 hours.    Radiology Studies: I have reviewed all of the imaging during this hospital visit personally     Scheduled Meds: . albuterol  2.5 mg Nebulization TID  . atorvastatin  10 mg Oral Daily  . fluticasone furoate-vilanterol  1 puff Inhalation Daily  . gabapentin  600 mg Oral TID  . heparin  5,000 Units Subcutaneous Q8H  . insulin aspart  0-20 Units Subcutaneous TID WC  . insulin aspart  0-5 Units Subcutaneous QHS  . isosorbide-hydrALAZINE  1 tablet Oral BID  . linaclotide  290 mcg Oral QAC breakfast  . loratadine  10 mg Oral Daily  . mouth rinse  15 mL Mouth Rinse BID  . montelukast  10 mg Oral QHS  . pantoprazole  40 mg Oral Daily  .  QUEtiapine  400 mg Oral QHS  . spironolactone  50 mg Oral Daily   Continuous Infusions: . azithromycin    . cefTRIAXone (ROCEPHIN)  IV 1 g (04/25/20 1024)     LOS: 1 day        Meryem Haertel Gerome Apley, MD

## 2020-04-25 NOTE — Evaluation (Signed)
Physical Therapy Evaluation Patient Details Name: Cynthia Becker MRN: [id number] DOB: 13-May-1971 Today's Date: 04/25/2020   History of Present Illness  Patient is a 49 y.o. female with PMH significant for morbid obesity, HLD, HTN, DM, OA, depression, anxiety, asthma, back surgery. Patient presented to [facility] for chest congestion with 2-3 days of symptoms including chills, SOB, and productive cough. Patient admitted for PNA.    Clinical Impression  Cynthia Becker is 49 y.o. feamle admitted with above HPI and diagnosis. Patient is currently limited by functional impairments below (see PT problem list). Patient lives with her husband and grandson and is modified independent for mobility and ADL's at baseline. Patient currently is mobilizing at min guard/supervision level for transfers and gait with RW. She is limited greatly by chronic low back pain and is unable to ambulate community distances due to pain. She ambulated ~ 69' with RW prior to requiring seated rest break. Pt required 2L/min to maintain sats at 89-93% during gait. Patient will benefit from continued skilled PT interventions to address impairments and progress independence with mobility, recommending HHPT due to limited community mobility. Acute PT will follow and progress as able.     Follow Up Recommendations Home health PT (pt has great difficulty mobilizing community distances)    Equipment Recommendations  None recommended by PT (pt is waiting on power wheelchair)    Recommendations for Other Services       Precautions / Restrictions Precautions Precautions: Fall Precaution Comments: watch O2 sats Restrictions Weight Bearing Restrictions: No      Mobility  Bed Mobility Overal bed mobility: Modified Independent Bed Mobility: Supine to Sit     Supine to sit: Supervision;Modified independent (Device/Increase time);HOB elevated     General bed mobility comments: No assist required, supervision for safety. HOB elevated  and pt using bed rails.    Transfers Overall transfer level: Needs assistance Equipment used: Rolling walker (2 wheeled);None Transfers: Sit to/from [employer] to Stand: Supervision;Min guard Stand pivot transfers: Supervision;Min guard       General transfer comment: guarding initially for safety, progressed to supervision with RW or IV pole to rise from EOB, recliner, and toilet.   Ambulation/Gait Ambulation/Gait assistance: Min guard Gait Distance (Feet): 90 Feet (2x30, 2x15) Assistive device: Rolling walker (2 wheeled);IV Pole Gait Pattern/deviations: Step-through pattern;Decreased step length - right;Decreased step length - left;Wide base of support;Shuffle Gait velocity: decr   General Gait Details: pt with reduced hip/knee flexion and reduced foot clearance with shuffling gait. pt required seated rest due to back pain and then ambulated back to room. Pt on 2L/min throughout wiht SpO2 at 89-93%. Pt ambulated to bathroom with IV pole and mildly unsteady gait however no overt LOB.  Stairs            Wheelchair Mobility    Modified Rankin (Stroke Patients Only)       Balance Overall balance assessment: Needs assistance Sitting-balance support: Feet supported;Bilateral upper extremity supported Sitting balance-Leahy Scale: Good     Standing balance support: During functional activity;Bilateral upper extremity supported;Single extremity supported Standing balance-Leahy Scale: Fair                               Pertinent Vitals/Pain Pain Assessment: Faces Faces Pain Scale: Hurts whole lot Pain Location: back Pain Descriptors / Indicators: Aching;Discomfort;Constant Pain Intervention(s): Limited activity within patient's tolerance;Monitored during session;Repositioned;Heat applied    Home Living Family/patient expects to be discharged to::  Private residence Living Arrangements: Spouse/significant other;Other relatives (27 y.o.  grandson) Available Help at Discharge: Family Type of Home: House Home Access: Stairs to enter Entrance Stairs-Rails: None Entrance Stairs-Number of Steps: 1 Home Layout: One level Home Equipment: Environmental consultant - 2 wheels;Cane - single point;Shower seat Additional Comments: pt uses walk in shower with bench to bath self. she reports often she furniture surfs in her house to steady self with gait.    Prior Function Level of Independence: Independent;Independent with assistive device(s)               Hand Dominance   Dominant Hand: Right    Extremity/Trunk Assessment   Upper Extremity Assessment Upper Extremity Assessment: Overall WFL for tasks assessed    Lower Extremity Assessment Lower Extremity Assessment: Overall WFL for tasks assessed    Cervical / Trunk Assessment Cervical / Trunk Assessment: Other exceptions Cervical / Trunk Exceptions: habitus  Communication   Communication: No difficulties  Cognition Arousal/Alertness: Awake/alert Behavior During Therapy: WFL for tasks assessed/performed Overall Cognitive Status: Within Functional Limits for tasks assessed                                        General Comments      Exercises     Assessment/Plan    PT Assessment Patient needs continued PT services  PT Problem List Decreased strength;Decreased activity tolerance;Decreased balance;Decreased mobility;Decreased knowledge of use of DME;Obesity;Pain       PT Treatment Interventions DME instruction;Gait training;Stair training;Functional mobility training;Therapeutic activities;Therapeutic exercise;Balance training;Patient/family education    PT Goals (Current goals can be found in the Care Plan section)  Acute Rehab PT Goals Patient Stated Goal: be able to walk and have less back pain PT Goal Formulation: With patient Time For Goal Achievement: 05/09/20 Potential to Achieve Goals: Good    Frequency Min 3X/week   Barriers to discharge         Co-evaluation               AM-PAC PT "6 Clicks" Mobility  Outcome Measure Help needed turning from your back to your side while in a flat bed without using bedrails?: None Help needed moving from lying on your back to sitting on the side of a flat bed without using bedrails?: A Little Help needed moving to and from a bed to a chair (including a wheelchair)?: A Little Help needed standing up from a chair using your arms (e.g., wheelchair or bedside chair)?: A Little Help needed to walk in hospital room?: A Little Help needed climbing 3-5 steps with a railing? : A Little 6 Click Score: 19    End of Session Equipment Utilized During Treatment: Oxygen Activity Tolerance: Patient tolerated treatment well Patient left: in chair;with call bell/phone within reach Nurse Communication: Mobility status PT Visit Diagnosis: Unsteadiness on feet (R26.81);Pain Pain - part of body:  (back pain)    Time: 7353-2992 PT Time Calculation (min) (ACUTE ONLY): 46 min   Charges:   PT Evaluation $PT Eval Moderate Complexity: 1 Mod PT Treatments $Gait Training: 8-22 mins $Self Care/Home Management: 8-22        Verner Mould, [place]  Office [phone] Pager [phone]  04/25/2020 6:49 PM

## 2020-04-26 DIAGNOSIS — F32A Depression, unspecified: Secondary | ICD-10-CM

## 2020-04-26 LAB — CBC WITH DIFFERENTIAL/PLATELET
Abs Immature Granulocytes: 0.08 10*3/uL — ABNORMAL HIGH (ref 0.00–0.07)
Basophils Absolute: 0.1 10*3/uL (ref 0.0–0.1)
Basophils Relative: 1 %
Eosinophils Absolute: 0.3 10*3/uL (ref 0.0–0.5)
Eosinophils Relative: 3 %
HCT: 44.2 % (ref 36.0–46.0)
Hemoglobin: 14.1 g/dL (ref 12.0–15.0)
Immature Granulocytes: 1 %
Lymphocytes Relative: 39 %
Lymphs Abs: 3.3 10*3/uL (ref 0.7–4.0)
MCH: 25 pg — ABNORMAL LOW (ref 26.0–34.0)
MCHC: 31.9 g/dL (ref 30.0–36.0)
MCV: 78.2 fL — ABNORMAL LOW (ref 80.0–100.0)
Monocytes Absolute: 0.5 10*3/uL (ref 0.1–1.0)
Monocytes Relative: 6 %
Neutro Abs: 4.2 10*3/uL (ref 1.7–7.7)
Neutrophils Relative %: 50 %
Platelets: 293 10*3/uL (ref 150–400)
RBC: 5.65 MIL/uL — ABNORMAL HIGH (ref 3.87–5.11)
RDW: 15.8 % — ABNORMAL HIGH (ref 11.5–15.5)
WBC: 8.4 10*3/uL (ref 4.0–10.5)
nRBC: 0.4 % — ABNORMAL HIGH (ref 0.0–0.2)

## 2020-04-26 LAB — BASIC METABOLIC PANEL
Anion gap: 8 (ref 5–15)
BUN: 10 mg/dL (ref 6–20)
CO2: 33 mmol/L — ABNORMAL HIGH (ref 22–32)
Calcium: 8.8 mg/dL — ABNORMAL LOW (ref 8.9–10.3)
Chloride: 98 mmol/L (ref 98–111)
Creatinine, Ser: 0.9 mg/dL (ref 0.44–1.00)
GFR, Estimated: >60 mL/min (ref >60)
Glucose, Bld: 110 mg/dL — ABNORMAL HIGH (ref 70–99)
Potassium: 3.8 mmol/L (ref 3.5–5.1)
Sodium: 139 mmol/L (ref 135–145)

## 2020-04-26 LAB — LEGIONELLA PNEUMOPHILA SEROGP 1 UR AG: L. pneumophila Serogp 1 Ur Ag: NEGATIVE

## 2020-04-26 LAB — GLUCOSE, CAPILLARY
Glucose-Capillary: 113 mg/dL — ABNORMAL HIGH (ref 70–99)
Glucose-Capillary: 139 mg/dL — ABNORMAL HIGH (ref 70–99)
Glucose-Capillary: 162 mg/dL — ABNORMAL HIGH (ref 70–99)
Glucose-Capillary: 191 mg/dL — ABNORMAL HIGH (ref 70–99)

## 2020-04-26 MED ORDER — GUAIFENESIN-DM 100-10 MG/5ML PO SYRP: 5.0000 mL | ORAL_SOLUTION | ORAL | Status: DC | PRN

## 2020-04-26 MED ORDER — ONDANSETRON HCL 4 MG/2ML IJ SOLN
4.0000 mg | Freq: Four times a day (QID) | INTRAMUSCULAR | Status: DC | PRN
  Administered 2020-04-26: 4 mg via INTRAVENOUS
  Filled 2020-04-26: qty 2

## 2020-04-26 MED ORDER — METHYLPREDNISOLONE SODIUM SUCC 40 MG IJ SOLR
40.0000 mg | Freq: Every day | INTRAMUSCULAR | Status: DC
  Administered 2020-04-26 – 2020-04-29 (×4): 40 mg via INTRAVENOUS
  Filled 2020-04-26 (×4): qty 1

## 2020-04-26 NOTE — Progress Notes (Signed)
Occupational Therapy Evaluation  Patient with functional deficits listed below impacting safety and independence with self care. Patient reports typically mod I with use of cane at baseline but since R knee pain started ~2 weeks ago has needed more help from spouse for dressing/bathing. Educated patient in use of AE and provided AE kit. Patient return demo use of reacher + sock aid with min A for sock aid use. Recommend continued acute OT services in order to facilitate D/C to venue listed below.    04/26/20 1400  OT Visit Information  Last OT Received On 04/26/20  Assistance Needed +1  History of Present Illness Patient is a 49 y.o. female with PMH significant for morbid obesity, HLD, HTN, DM, OA, depression, anxiety, asthma, back surgery. Patient presented to [facility] for chest congestion with 2-3 days of symptoms including chills, SOB, and productive cough. Patient admitted for PNA.  Precautions  Precautions Fall  Precaution Comments watch O2 sats  Restrictions  Weight Bearing Restrictions No  Home Living  Family/patient expects to be discharged to: Private residence  Living Arrangements Spouse/significant other;Other relatives (28 yr old grandson)  Available Help at Discharge Family  Type of [employer] to enter  Entrance Stairs-Number of Steps 1  Entrance Stairs-Rails None  Home Layout One level  Bathroom Shower/Tub Tub/shower unit;Walk-in Cytogeneticist Yes  How Accessible Accessible via walker;Accessible via wheelchair  [place] - 2 wheels;Cane - single point;Shower seat  Additional Comments pt uses walk in shower with bench to bath self. she reports often she furniture surfs in her house to steady self with gait. reports getting a w/c for home  Prior Function  Level of Independence Independent;Independent with assistive device(s)  Comments patient reports independent with self care before R knee pain  started, spouse has been having to assist intermittently with dressing, bathing  Communication  Communication No difficulties  Pain Assessment  Pain Assessment Faces  Faces Pain Scale 6  Pain Location R LE   Pain Descriptors / Indicators Discomfort;Grimacing  Pain Intervention(s) Monitored during session  Cognition  Arousal/Alertness Awake/alert  Behavior During Therapy WFL for tasks assessed/performed  Overall Cognitive Status Within Functional Limits for tasks assessed  Upper Extremity Assessment  Upper Extremity Assessment Overall WFL for tasks assessed  Lower Extremity Assessment  Lower Extremity Assessment Defer to PT evaluation  Cervical / Trunk Assessment  Cervical / Trunk Assessment Other exceptions  Cervical / Trunk Exceptions habitus  ADL  Overall ADL's  Needs assistance/impaired  Eating/Feeding Independent  Grooming Set up;Sitting  Upper Body Bathing Set up;Sitting;With adaptive equipment  Upper Body Bathing Details (indicate cue type and reason) patient reports using loofa at home, provided patient with long handled sponge as she reports cannot reach feet/back with loofa   Lower Body Bathing Minimal assistance;Sitting/lateral leans;Sit to/from stand  Lower Body Bathing Details (indicate cue type and reason) at baseline patient reports needing assist with bathing feet. provided patient with LH sponge to assist with this patient appreciative  Upper Body Dressing  Set up;Sitting  Lower Body Dressing Minimal assistance;Sitting/lateral leans  Lower Body Dressing Details (indicate cue type and reason) patient reports needing assist with socks/shoes and underwear/shorts "its a struggle" educated patient in use of AE reacher and sock aid and practiced doff/don sock. patient require min A with sock aid however very appreciatve "this will help a lot"   Toilet Transfer Details (indicate cue type and reason) did not assess this session, patient  reports pivoting to chair with assist x1   Toileting- Clothing Manipulation and Hygiene Moderate assistance;Sit to/from stand  General ADL Comments today's session focused on education of LB AE for dressing + bathing to maximize independence and energy conservation as patient reports "its a struggle" getting on underwear + pants. provided patient with AE kit and reviewed all items, patient return demo use of reacher + sock aid  Bed Mobility  General bed mobility comments OOB in recliner  Transfers  General transfer comment did not assess this session, per patient transfers to recliner with x1 assist  Balance  Overall balance assessment Needs assistance  Sitting-balance support Feet supported  Sitting balance-Leahy Scale Good  OT - End of Session  Activity Tolerance Patient tolerated treatment well  Patient left in chair;with call bell/phone within reach;with family/visitor present  OT Assessment  OT Recommendation/Assessment Patient needs continued OT Services  OT Visit Diagnosis Other abnormalities of gait and mobility (R26.89);Pain  Pain - Right/Left Right  Pain - part of body Knee (back)  OT Problem List Decreased activity tolerance;Obesity;Pain  OT Plan  OT Frequency (ACUTE ONLY) Min 2X/week  OT Treatment/Interventions (ACUTE ONLY) Self-care/ADL training;Energy conservation;DME and/or AE instruction;Therapeutic activities;Patient/family education  AM-PAC OT "6 Clicks" Daily Activity Outcome Measure (Version 2)  Help from another person eating meals? 4  Help from another person taking care of personal grooming? 3  Help from another person toileting, which includes using toliet, bedpan, or urinal? 2  Help from another person bathing (including washing, rinsing, drying)? 3  Help from another person to put on and taking off regular upper body clothing? 3  Help from another person to put on and taking off regular lower body clothing? 3  6 Click Score 18  OT Recommendation  Follow Up Recommendations No OT follow up  OT  Equipment None recommended by OT  Individuals Consulted  Consulted and Agree with Results and Recommendations Patient  Acute Rehab OT Goals  Patient Stated Goal be as independent as I can  OT Goal Formulation With patient  Time For Goal Achievement 05/10/20  Potential to Achieve Goals Good  OT Time Calculation  OT Start Time (ACUTE ONLY) 1058  OT Stop Time (ACUTE ONLY) 1121  OT Time Calculation (min) 23 min  OT General Charges  $OT Visit 1 Visit  OT Evaluation  $OT Eval Low Complexity 1 Low  OT Treatments  $Self Care/Home Management  8-22 mins  Written Expression  Dominant Hand Right   Delbert Phenix OT OT pager: [phone]

## 2020-04-26 NOTE — TOC Progression Note (Signed)
Transition of Care [facility]) - Progression Note    Patient Details  Name: Cynthia Becker MRN: [id number] Date of Birth: [birth year]  [facility] CM/SW Contact  Purcell Mouton, RN Phone Number: 04/26/2020, 1:22 PM  Clinical Narrative:     Pt from home and plan to return home TOC will continue to follow.   Expected Discharge Plan: Home/Self Care Barriers to Discharge: No Barriers Identified  Expected Discharge Plan and Services Expected Discharge Plan: Home/Self Care       Living arrangements for the past 2 months: Single Family Home                                       Social Determinants of Health (SDOH) Interventions    Readmission Risk Interventions No flowsheet data found.

## 2020-04-26 NOTE — Plan of Care (Signed)
  Problem: Education: Goal: Knowledge of General Education information will improve Description: Including pain rating scale, medication(s)/side effects and non-pharmacologic comfort measures Outcome: Progressing   Problem: Clinical Measurements: Goal: Ability to maintain clinical measurements within normal limits will improve Outcome: Progressing Goal: Will remain free from infection Outcome: Progressing Goal: Diagnostic test results will improve Outcome: Progressing Goal: Respiratory complications will improve Outcome: Progressing   Problem: Activity: Goal: Risk for activity intolerance will decrease Outcome: Progressing   Problem: Elimination: Goal: Will not experience complications related to bowel motility Outcome: Progressing Goal: Will not experience complications related to urinary retention Outcome: Progressing   Problem: Pain Managment: Goal: General experience of comfort will improve Outcome: Progressing   Problem: Safety: Goal: Ability to remain free from injury will improve Outcome: Progressing   Problem: Skin Integrity: Goal: Risk for impaired skin integrity will decrease Outcome: Progressing

## 2020-04-26 NOTE — Progress Notes (Signed)
Physical Therapy Treatment Patient Details Name: Cynthia Becker MRN: [id number] DOB: [birth year] Today's Date: 04/26/2020    History of Present Illness Patient is a 49 y.o. female with PMH significant for morbid obesity, HLD, HTN, DM, OA, depression, anxiety, asthma, back surgery. Patient presented to [facility] for chest congestion with 2-3 days of symptoms including chills, SOB, and productive cough. Patient admitted for PNA.    PT Comments    Patient continues to mobilize in room to bathroom with IV pole to steady. Patient used RW for gait in hallway today and ambulated ~ 55' before requiring seated rest break. She continues to desaturate with supplemental O2 but recovers quickly from low of 88% to 94% on 2L/min with ambulation. Patient educated on pursed lip breathing to reduce SOB with activity. She will continue to benefit from skilled PT interventions to address impairments and progress towards goals.    Follow Up Recommendations  Home health PT (pt has great difficulty mobilizing community distances)     Equipment Recommendations  None recommended by PT (pt is waiting on power wheelchair)    Recommendations for Other Services       Precautions / Restrictions Precautions Precautions: Fall Precaution Comments: watch O2 sats Restrictions Weight Bearing Restrictions: No    Mobility  Bed Mobility               General bed mobility comments: pt OOB in recliner  Transfers Overall transfer level: Needs assistance Equipment used: Rolling walker (2 wheeled);None Transfers: Sit to/from Stand Sit to Stand: Supervision         General transfer comment: supervision for rise from recliner. no assist required for power up. pt steady with RW in standing.  Ambulation/Gait Ambulation/Gait assistance: Min guard Gait Distance (Feet): 80 Feet (2x40) Assistive device: Rolling walker (2 wheeled);IV Pole Gait Pattern/deviations: Step-through pattern;Decreased step length -  right;Decreased step length - left;Wide base of support;Shuffle Gait velocity: decr   General Gait Details: pt progressed gait distance to longer lengths with seated break between. HR max of 117 bpm and SpO2 dropped to 88% for low on 2L/min during gait. No overt LOB noted, pt c/o increaesd back pain with gait.   Stairs             Wheelchair Mobility    Modified Rankin (Stroke Patients Only)       Balance Overall balance assessment: Needs assistance Sitting-balance support: Feet supported;Bilateral upper extremity supported Sitting balance-Leahy Scale: Good     Standing balance support: During functional activity;Bilateral upper extremity supported;Single extremity supported Standing balance-Leahy Scale: Fair                              Cognition Arousal/Alertness: Awake/alert Behavior During Therapy: WFL for tasks assessed/performed Overall Cognitive Status: Within Functional Limits for tasks assessed                                        Exercises      General Comments General comments (skin integrity, edema, etc.): educated on pursed lip breathing to recover SOB from gait.      Pertinent Vitals/Pain Pain Assessment: Faces Faces Pain Scale: Hurts whole lot Pain Location: back Pain Descriptors / Indicators: Aching;Discomfort;Constant Pain Intervention(s): Limited activity within patient's tolerance;Monitored during session;Repositioned;Heat applied    Home Living  Prior Function            PT Goals (current goals can now be found in the care plan section) Acute Rehab PT Goals Patient Stated Goal: be able to walk and have less back pain PT Goal Formulation: With patient Time For Goal Achievement: 05/09/20 Potential to Achieve Goals: Good Progress towards PT goals: Progressing toward goals    Frequency    Min 3X/week      PT Plan Current plan remains appropriate    Co-evaluation               AM-PAC PT "6 Clicks" Mobility   Outcome Measure  Help needed turning from your back to your side while in a flat bed without using bedrails?: None Help needed moving from lying on your back to sitting on the side of a flat bed without using bedrails?: A Little Help needed moving to and from a bed to a chair (including a wheelchair)?: A Little Help needed standing up from a chair using your arms (e.g., wheelchair or bedside chair)?: A Little Help needed to walk in hospital room?: A Little Help needed climbing 3-5 steps with a railing? : A Little 6 Click Score: 19    End of Session Equipment Utilized During Treatment: Oxygen Activity Tolerance: Patient tolerated treatment well Patient left: in chair;with call bell/phone within reach Nurse Communication: Mobility status PT Visit Diagnosis: Unsteadiness on feet (R26.81);Pain Pain - part of body:  (back pain)     Time: 8616-8372 PT Time Calculation (min) (ACUTE ONLY): 22 min  Charges:  $Gait Training: 8-22 mins                     Verner Mould, DPT Acute Rehabilitation Services  Office [phone] Pager [phone]  04/26/2020 12:53 PM

## 2020-04-26 NOTE — Progress Notes (Signed)
PROGRESS NOTE    Cynthia Becker  IOX:[id number] DOB: 05/26/1971 DOA: 04/24/2020 PCP: [name], NP    Brief Narrative:  [name] was admitted to the hospital with a working diagnosis of acute hypoxic respiratory failure due to community-acquired pneumonia.  49 year old female with obesity class III, dyslipidemia, asthma, hypertension, type 2 diabetes mellitus who presented with chest congestion.  Symptoms present for about 2 to 3 days associated with chills, dyspnea and productive cough.  Positive chest tightness and wheezing.  On her initial physical examination blood pressure 158/80, heart rate 90, temperature 98.1, respiratory rate 24, oxygen saturation 96% on supplemental oxygen. Her lungs had bilateral wheezing, no rhonchi or rails, heart S1-S2, present rhythmic, soft abdomen, no lower extremity edema. Sodium 139, potassium 4.3, chloride 97, bicarb 32, glucose 134, BUN 11, creatinine 0.82, white count 14.1, hemoglobin 14.7, hematocrit 45.8, platelets 327.  Chest radiograph, hypopenetrated, interstitial infiltrate left upper lobe and right lower lobe.  CT chest with bilateral airspace opacities no pulmonary embolism.  Persistent hypoxemia on ambulation.    Assessment & Plan:   Principal Problem:   Community acquired bacterial pneumonia Active Problems:   Dyslipidemia   Depression   Asthma   OSA (obstructive sleep apnea)   Acute respiratory failure (HCC)   Essential hypertension   Class 3 obesity   1. Acute bilateral left upper lobe and right lower lobe community acquired pneumonia/ complicated with acute hypoxic respiratory failure.  On 2 L/min per [place] with oxygen saturation 97% at rest, positive desaturation down to 89% on ambulation. Continue to have dyspnea and wheezing.   Tolerating well ceftriaxone and azithromycin Continue with bronchodilator therapy, airway clearing techniques and antitussive agents.   2. Asthma exacerbation. Due to persistent dyspnea and  wheezing, will add 40 mg IV methylprednisolone.   Continue with bronchodilator therapy and inhaled corticosteroids. Continue to encourage mobility, out of bed to chair tid with meals and PT/OT.   3. Obesity class 3, OSA. BMI more than 40,   4. T2DM/ dysplidemia. Fasting glucose is 110 this am, patient will be placed on steroids for asthma exacerbation, that may trigger steroi induced hyperglycemia.   Continue with statin therapy.   5. HTN. Blood pressure control with isosorbide and hydralazine. Diuresis with spironolactone 50 mg daily.   6. Depression. Continue with quetiapine.    Patient continue to be at high risk for worsening respiratory failure.   Status is: Inpatient  Remains inpatient appropriate because:IV treatments appropriate due to intensity of illness or inability to take PO   Dispo: The patient is from: Home              Anticipated d/c is to: Home              Anticipated d/c date is: 3 days              Patient currently is not medically stable to d/c.   DVT prophylaxis: Enoxaparin   Code Status:   full  Family Communication:  I spoke with patient's husband at the bedside, we talked in detail about patient's condition, plan of care and prognosis and all questions were addressed.     Antimicrobials:   Azithromycin and ceftriaxone     Subjective: Patient continue to have dyspnea on exertion, positive wheezing and cough, no nausea or vomiting, no chest pain.   Objective: Vitals:   04/25/20 1453 04/25/20 2039 04/26/20 0608 04/26/20 0817  BP: 134/80 (!) 166/110 (!) 126/92   Pulse: 94 97 95  Resp: 19 20 20    Temp: 97.9 F (36.6 C) 98.4 F (36.9 C) 97.9 F (36.6 C)   TempSrc: Oral Oral Oral   SpO2: 91% 92% 100% 97%  Weight:      Height:       No intake or output data in the 24 hours ending 04/26/20 1146 Filed Weights   04/24/20 1240 04/24/20 1409 04/24/20 2100  Weight: (!) 212.7 kg (!) 212.7 kg (!) 212.7 kg    Examination:   General: Not  in pain. Dyspnea on exertion.  Neurology: Awake and alert, non focal  E ENT: no pallor, no icterus, oral mucosa moist Cardiovascular: No JVD. S1-S2 present, rhythmic, no gallops, rubs, or murmurs. No lower extremity edema. Pulmonary: positive breath sounds bilaterally, decreased air movement, positive expiratory wheezing, with no significant  rhonchi or rales. Gastrointestinal. Abdomen soft and non tender Skin. No rashes Musculoskeletal: no joint deformities     Data Reviewed: I have personally reviewed following labs and imaging studies  CBC: Recent Labs  Lab 04/24/20 1320 04/25/20 0510 04/26/20 0537  WBC 14.1* 10.9* 8.4  NEUTROABS  --  6.4 4.2  HGB 14.7 13.5 14.1  HCT 45.8 41.9 44.2  MCV 79.1* 78.8* 78.2*  PLT 327 291 992   Basic Metabolic Panel: Recent Labs  Lab 04/24/20 1320 04/25/20 0510 04/26/20 0537  NA 139 138 139  K 4.3 4.0 3.8  CL 97* 98 98  CO2 32 32 33*  GLUCOSE 134* 118* 110*  BUN 11 10 10   CREATININE 0.82 0.74 0.90  CALCIUM 8.7* 8.5* 8.8*  MG  --  2.2  --    GFR: Estimated Creatinine Clearance: 140.8 mL/min (by C-G formula based on SCr of 0.9 mg/dL). Liver Function Tests: Recent Labs  Lab 04/25/20 0510  AST 17  ALT 18  ALKPHOS 67  BILITOT 0.6  PROT 7.1  ALBUMIN 3.0*   No results for input(s): LIPASE, AMYLASE in the last 168 hours. No results for input(s): AMMONIA in the last 168 hours. Coagulation Profile: No results for input(s): INR, PROTIME in the last 168 hours. Cardiac Enzymes: No results for input(s): CKTOTAL, CKMB, CKMBINDEX, TROPONINI in the last 168 hours. BNP (last 3 results) Recent Labs    10/18/19 1600  PROBNP 12   HbA1C: No results for input(s): HGBA1C in the last 72 hours. CBG: Recent Labs  Lab 04/25/20 0749 04/25/20 1201 04/25/20 1649 04/25/20 2036 04/26/20 0754  GLUCAP 122* 176* 145* 108* 113*   Lipid Profile: No results for input(s): CHOL, HDL, LDLCALC, TRIG, CHOLHDL, LDLDIRECT in the last 72  hours. Thyroid Function Tests: Recent Labs    04/25/20 0510  TSH 1.766   Anemia Panel: No results for input(s): VITAMINB12, FOLATE, FERRITIN, TIBC, IRON, RETICCTPCT in the last 72 hours.    Radiology Studies: I have reviewed all of the imaging during this hospital visit personally     Scheduled Meds: . atorvastatin  10 mg Oral Daily  . azithromycin  500 mg Oral Daily  . fluticasone furoate-vilanterol  1 puff Inhalation Daily  . gabapentin  600 mg Oral TID  . heparin  5,000 Units Subcutaneous Q8H  . insulin aspart  0-20 Units Subcutaneous TID WC  . insulin aspart  0-5 Units Subcutaneous QHS  . isosorbide-hydrALAZINE  1 tablet Oral BID  . linaclotide  290 mcg Oral QAC breakfast  . loratadine  10 mg Oral Daily  . mouth rinse  15 mL Mouth Rinse BID  . montelukast  10 mg Oral QHS  .  pantoprazole  40 mg Oral Daily  . QUEtiapine  400 mg Oral QHS  . spironolactone  50 mg Oral Daily   Continuous Infusions: . cefTRIAXone (ROCEPHIN)  IV 1 g (04/25/20 1024)     LOS: 2 days        Avriana Joo Gerome Apley, MD

## 2020-04-27 ENCOUNTER — Inpatient Hospital Stay (HOSPITAL_COMMUNITY): Payer: Medicaid Other

## 2020-04-27 DIAGNOSIS — R0600 Dyspnea, unspecified: Secondary | ICD-10-CM

## 2020-04-27 DIAGNOSIS — I340 Nonrheumatic mitral (valve) insufficiency: Secondary | ICD-10-CM

## 2020-04-27 LAB — GLUCOSE, CAPILLARY
Glucose-Capillary: 115 mg/dL — ABNORMAL HIGH (ref 70–99)
Glucose-Capillary: 136 mg/dL — ABNORMAL HIGH (ref 70–99)
Glucose-Capillary: 176 mg/dL — ABNORMAL HIGH (ref 70–99)
Glucose-Capillary: 149 mg/dL — ABNORMAL HIGH (ref 70–99)

## 2020-04-27 LAB — CULTURE, RESPIRATORY W GRAM STAIN: Culture: NORMAL

## 2020-04-27 LAB — ECHOCARDIOGRAM COMPLETE
Area-P 1/2: 3.53 cm2
Height: 65 in
S' Lateral: 3.2 cm
Weight: 7502.69 oz

## 2020-04-27 MED ORDER — PERFLUTREN LIPID MICROSPHERE
1.0000 mL | INTRAVENOUS | Status: AC | PRN
  Administered 2020-04-27: 2.5 mL via INTRAVENOUS
  Filled 2020-04-27: qty 10

## 2020-04-27 NOTE — Progress Notes (Signed)
  Echocardiogram 2D Echocardiogram has been performed.  Fidel Levy 04/27/2020, 12:44 PM

## 2020-04-27 NOTE — Plan of Care (Signed)
  Problem: Education: Goal: Knowledge of General Education information will improve Description: Including pain rating scale, medication(s)/side effects and non-pharmacologic comfort measures Outcome: Progressing   Problem: Clinical Measurements: Goal: Ability to maintain clinical measurements within normal limits will improve Outcome: Progressing Goal: Will remain free from infection Outcome: Progressing Goal: Diagnostic test results will improve Outcome: Progressing Goal: Respiratory complications will improve Outcome: Progressing   Problem: Activity: Goal: Risk for activity intolerance will decrease Outcome: Progressing   Problem: Elimination: Goal: Will not experience complications related to bowel motility Outcome: Progressing Goal: Will not experience complications related to urinary retention Outcome: Progressing   Problem: Pain Managment: Goal: General experience of comfort will improve Outcome: Progressing   Problem: Safety: Goal: Ability to remain free from injury will improve Outcome: Progressing   Problem: Skin Integrity: Goal: Risk for impaired skin integrity will decrease Outcome: Progressing

## 2020-04-27 NOTE — Progress Notes (Signed)
PROGRESS NOTE    Cynthia Becker  HYI:[id number] DOB: 10/22/1970 DOA: 04/24/2020 PCP: [name], NP    Brief Narrative:  [name] was admitted to the hospital with a working diagnosis of acute hypoxic respiratory failure due to community-acquired pneumonia.  49 year old female with obesity class III, dyslipidemia, asthma, hypertension, type 2 diabetes mellitus who presented with chest congestion. Symptoms present for about 2 to 3 days associated with chills, dyspnea and productive cough. Positive chest tightness and wheezing. On her initial physical examination blood pressure 158/80, heart rate 90, temperature 98.1, respiratory rate24, oxygen saturation 96% on supplemental oxygen. Her lungs had bilateral wheezing, no rhonchi or rails, heart S1-S2, present rhythmic, soft abdomen, no lower extremity edema. Sodium 139, potassium 4.3, chloride 97, bicarb 32, glucose 134, BUN 11, creatinine 0.82, white count 14.1, hemoglobin 14.7, hematocrit 45.8, platelets 327.  Chest radiograph, hypopenetrated, interstitial infiltrate left upper lobe and right lower lobe. CT chest with bilateral airspace opacities no pulmonary embolism.  Persistent hypoxemia on ambulation, down to 89%.   Assessment & Plan:   Principal Problem:   Community acquired bacterial pneumonia Active Problems:   Dyslipidemia   Depression   Asthma   OSA (obstructive sleep apnea)   Acute respiratory failure (HCC)   Essential hypertension   Class 3 obesity    1. Acute bilateral left upper lobe and right lower lobe community acquired pneumonia/ complicated with acute hypoxic respiratory failure.  Continue to have dyspnea not yet back to baseline.  Stable oxygen requirements at 2 L/min per [place] with oxygen saturation 95% at rest.  Continue antibiotic therapy with ceftriaxone and azithromycin  On bronchodilator therapy, airway clearing techniques and antitussive agents.   2. Asthma exacerbation. continue systemic  corticosteroids with methylprednisolone 40 mg IV q 24 H.   On bronchodilator therapy and inhaled corticosteroids.  Encourage mobility, out of bed to chair tid with meals and PT/OT.   3. Obesity class 3, OSA. BMI more than 40, will check echocardiogram to assess RV function and possible pulmonary hypertension.   4. T2DM/ dysplidemia. Fasting glucose is 115 this am, tolerating well steroids.  For now will continue with insulin sliding scale for glucose cover and monitoring.   On statin therapy.   5. HTN. stable blood pressure 786 mmHg systolic, continue with isosorbide, hydralazine and spironolactone.  6. Depression. On quetiapine.    Status is: Inpatient  Remains inpatient appropriate because:IV treatments appropriate due to intensity of illness or inability to take PO   Dispo: The patient is from: Home              Anticipated d/c is to: Home              Anticipated d/c date is: 2 days              Patient currently is not medically stable to d/c.   DVT prophylaxis: Heparin   Code Status:   full  Family Communication:  I spoke with patient's husband at the bedside, we talked in detail about patient's condition, plan of care and prognosis and all questions were addressed.      Antimicrobials:   Ceftriaxone and azithromycin      Subjective: Patient continue to have dyspnea and wheezing, worse with exertion. Had episode of sever symptoms last night. No nausea or vomiting.   Objective: Vitals:   04/26/20 0817 04/26/20 1431 04/26/20 2122 04/27/20 0623  BP:  133/84 (!) 157/92 136/87  Pulse:  90 88 84  Resp:  18 18 18   Temp:  98.1 F (36.7 C) 98.6 F (37 C) 97.6 F (36.4 C)  TempSrc:  Oral Oral Oral  SpO2: 97% 94% 100% 98%  Weight:      Height:       No intake or output data in the 24 hours ending 04/27/20 0915 Filed Weights   04/24/20 1240 04/24/20 1409 04/24/20 2100  Weight: (!) 212.7 kg (!) 212.7 kg (!) 212.7 kg    Examination:   General:  deconditioned  Neurology: Awake and alert, non focal  E ENT: no pallor, no icterus, oral mucosa moist Cardiovascular: No JVD. S1-S2 present, rhythmic, no gallops, rubs, or murmurs. No lower extremity edema. Pulmonary: positive breath sounds bilaterally, mild expiratory wheezing, with no rhonchi or rales. Gastrointestinal. Abdomen soft and non tender Skin. No rashes Musculoskeletal: no joint deformities     Data Reviewed: I have personally reviewed following labs and imaging studies  CBC: Recent Labs  Lab 04/24/20 1320 04/25/20 0510 04/26/20 0537  WBC 14.1* 10.9* 8.4  NEUTROABS  --  6.4 4.2  HGB 14.7 13.5 14.1  HCT 45.8 41.9 44.2  MCV 79.1* 78.8* 78.2*  PLT 327 291 680   Basic Metabolic Panel: Recent Labs  Lab 04/24/20 1320 04/25/20 0510 04/26/20 0537  NA 139 138 139  K 4.3 4.0 3.8  CL 97* 98 98  CO2 32 32 33*  GLUCOSE 134* 118* 110*  BUN 11 10 10   CREATININE 0.82 0.74 0.90  CALCIUM 8.7* 8.5* 8.8*  MG  --  2.2  --    GFR: Estimated Creatinine Clearance: 140.8 mL/min (by C-G formula based on SCr of 0.9 mg/dL). Liver Function Tests: Recent Labs  Lab 04/25/20 0510  AST 17  ALT 18  ALKPHOS 67  BILITOT 0.6  PROT 7.1  ALBUMIN 3.0*   No results for input(s): LIPASE, AMYLASE in the last 168 hours. No results for input(s): AMMONIA in the last 168 hours. Coagulation Profile: No results for input(s): INR, PROTIME in the last 168 hours. Cardiac Enzymes: No results for input(s): CKTOTAL, CKMB, CKMBINDEX, TROPONINI in the last 168 hours. BNP (last 3 results) Recent Labs    10/18/19 1600  PROBNP 12   HbA1C: No results for input(s): HGBA1C in the last 72 hours. CBG: Recent Labs  Lab 04/26/20 0754 04/26/20 1213 04/26/20 1723 04/26/20 2122 04/27/20 0749  GLUCAP 113* 139* 162* 191* 115*   Lipid Profile: No results for input(s): CHOL, HDL, LDLCALC, TRIG, CHOLHDL, LDLDIRECT in the last 72 hours. Thyroid Function Tests: Recent Labs    04/25/20 0510  TSH  1.766   Anemia Panel: No results for input(s): VITAMINB12, FOLATE, FERRITIN, TIBC, IRON, RETICCTPCT in the last 72 hours.    Radiology Studies: I have reviewed all of the imaging during this hospital visit personally     Scheduled Meds: . atorvastatin  10 mg Oral Daily  . azithromycin  500 mg Oral Daily  . fluticasone furoate-vilanterol  1 puff Inhalation Daily  . gabapentin  600 mg Oral TID  . heparin  5,000 Units Subcutaneous Q8H  . insulin aspart  0-20 Units Subcutaneous TID WC  . insulin aspart  0-5 Units Subcutaneous QHS  . isosorbide-hydrALAZINE  1 tablet Oral BID  . linaclotide  290 mcg Oral QAC breakfast  . loratadine  10 mg Oral Daily  . mouth rinse  15 mL Mouth Rinse BID  . methylPREDNISolone (SOLU-MEDROL) injection  40 mg Intravenous Daily  . montelukast  10 mg Oral QHS  .  pantoprazole  40 mg Oral Daily  . QUEtiapine  400 mg Oral QHS  . spironolactone  50 mg Oral Daily   Continuous Infusions: . cefTRIAXone (ROCEPHIN)  IV 1 g (04/26/20 1204)     LOS: 3 days        Shelley Pooley Gerome Apley, MD

## 2020-04-28 LAB — BASIC METABOLIC PANEL
Anion gap: 11 (ref 5–15)
BUN: 14 mg/dL (ref 6–20)
CO2: 27 mmol/L (ref 22–32)
Calcium: 8.9 mg/dL (ref 8.9–10.3)
Chloride: 99 mmol/L (ref 98–111)
Creatinine, Ser: 0.77 mg/dL (ref 0.44–1.00)
GFR, Estimated: >60 mL/min (ref >60)
Glucose, Bld: 120 mg/dL — ABNORMAL HIGH (ref 70–99)
Potassium: 3.9 mmol/L (ref 3.5–5.1)
Sodium: 137 mmol/L (ref 135–145)

## 2020-04-28 LAB — GLUCOSE, CAPILLARY
Glucose-Capillary: 110 mg/dL — ABNORMAL HIGH (ref 70–99)
Glucose-Capillary: 187 mg/dL — ABNORMAL HIGH (ref 70–99)
Glucose-Capillary: 163 mg/dL — ABNORMAL HIGH (ref 70–99)
Glucose-Capillary: 199 mg/dL — ABNORMAL HIGH (ref 70–99)

## 2020-04-28 MED ORDER — FUROSEMIDE 10 MG/ML IJ SOLN
40.0000 mg | Freq: Once | INTRAMUSCULAR | Status: AC
  Administered 2020-04-28: 40 mg via INTRAVENOUS
  Filled 2020-04-28: qty 4

## 2020-04-28 NOTE — Plan of Care (Signed)
Pt VS WNL this am.  Reporting pain at 4 or 5/10 at this time.  PRN pain meds administered this am.   Problem: Education: Goal: Knowledge of General Education information will improve Description: Including pain rating scale, medication(s)/side effects and non-pharmacologic comfort measures Outcome: Progressing   Problem: Clinical Measurements: Goal: Ability to maintain clinical measurements within normal limits will improve Outcome: Progressing Goal: Will remain free from infection Outcome: Progressing Goal: Diagnostic test results will improve Outcome: Progressing Goal: Respiratory complications will improve Outcome: Progressing   Problem: Activity: Goal: Risk for activity intolerance will decrease Outcome: Progressing   Problem: Elimination: Goal: Will not experience complications related to bowel motility Outcome: Progressing Goal: Will not experience complications related to urinary retention Outcome: Progressing   Problem: Pain Managment: Goal: General experience of comfort will improve Outcome: Progressing   Problem: Safety: Goal: Ability to remain free from injury will improve Outcome: Progressing   Problem: Skin Integrity: Goal: Risk for impaired skin integrity will decrease Outcome: Progressing

## 2020-04-28 NOTE — Progress Notes (Signed)
Physical Therapy Treatment Patient Details Name: Cynthia Becker MRN: [id number] DOB: 08/10/1970 Today's Date: 04/28/2020    History of Present Illness Patient is a 49 y.o. female with PMH significant for morbid obesity, HLD, HTN, DM, OA, depression, anxiety, asthma, back surgery. Patient presented to [facility] for chest congestion with 2-3 days of symptoms including chills, SOB, and productive cough. Patient admitted for PNA.    PT Comments    Patient making good progress with acute PT. She increased gait distance today to 26' before requiring seated rest break; pt continues to require chair follow for safety due to fatigue. She desaturated on RA to 77% during gait and is able to maintain sats of 88% or greater with 2L/min. She will continue to benefit from skilled PT interventions to progress mobility and activity tolerance as able.   Follow Up Recommendations  Home health PT (pt has great difficulty mobilizing community distances)     Equipment Recommendations  Other (comment) (pt is waiting on power wheelchair)    Recommendations for Other Services       Precautions / Restrictions Precautions Precautions: Fall Precaution Comments: watch O2 sats Restrictions Weight Bearing Restrictions: No    Mobility  Bed Mobility Overal bed mobility: Modified Independent Bed Mobility: Supine to Sit     Supine to sit: Modified independent (Device/Increase time);HOB elevated     General bed mobility comments: no assist needed. pt used rail and HOB elevated.  Transfers Overall transfer level: Needs assistance Equipment used: Rolling walker (2 wheeled);None Transfers: Sit to/from Stand Sit to Stand: Supervision Stand pivot transfers: Supervision       General transfer comment: no assist required. pt steady with rising and transfer sit<>stand and bed to chair. no assist needed from toilet for sit<>stand; pt using grab bar.  Ambulation/Gait Ambulation/Gait assistance: Supervision;Min  guard Gait Distance (Feet): 120 Feet (60, 30, 30) Assistive device: Standard walker Gait Pattern/deviations: Step-through pattern;Decreased step length - right;Decreased step length - left;Wide base of support Gait velocity: decr   General Gait Details: pt requries supervision-min guard with close chair follow for safety. pt with improved endurance today and ambulated ~60' in one bout before requiring seated rest break. Pt desaturating to 77% on RA with gait. On 2L/min pt sats maintained at 88% or greater with gait. no overt LOB noted.    Stairs             Wheelchair Mobility    Modified Rankin (Stroke Patients Only)       Balance Overall balance assessment: Needs assistance Sitting-balance support: Feet supported Sitting balance-Leahy Scale: Good     Standing balance support: During functional activity;Bilateral upper extremity supported;Single extremity supported Standing balance-Leahy Scale: Good Standing balance comment: ambulates short distances in room with no AD, requries RW for long distances                            Cognition Arousal/Alertness: Awake/alert Behavior During Therapy: WFL for tasks assessed/performed Overall Cognitive Status: Within Functional Limits for tasks assessed                                        Exercises      General Comments        Pertinent Vitals/Pain Pain Assessment: Faces Faces Pain Scale: Hurts whole lot Pain Location: back and Bil LE's Pain Descriptors / Indicators: Discomfort;Grimacing Pain  Intervention(s): Monitored during session;Limited activity within patient's tolerance;Heat applied;Repositioned    Home Living                      Prior Function            PT Goals (current goals can now be found in the care plan section) Acute Rehab PT Goals Patient Stated Goal: be as independent as I can PT Goal Formulation: With patient Time For Goal Achievement:  05/09/20 Potential to Achieve Goals: Good Progress towards PT goals: Progressing toward goals    Frequency    Min 3X/week      PT Plan Current plan remains appropriate    Co-evaluation              AM-PAC PT "6 Clicks" Mobility   Outcome Measure  Help needed turning from your back to your side while in a flat bed without using bedrails?: None Help needed moving from lying on your back to sitting on the side of a flat bed without using bedrails?: None Help needed moving to and from a bed to a chair (including a wheelchair)?: A Little Help needed standing up from a chair using your arms (e.g., wheelchair or bedside chair)?: A Little Help needed to walk in hospital room?: A Little Help needed climbing 3-5 steps with a railing? : A Little 6 Click Score: 20    End of Session Equipment Utilized During Treatment: Oxygen Activity Tolerance: Patient tolerated treatment well Patient left: in chair;with call bell/phone within reach Nurse Communication: Mobility status PT Visit Diagnosis: Unsteadiness on feet (R26.81);Pain     Time: 0156-1537 PT Time Calculation (min) (ACUTE ONLY): 33 min  Charges:  $Gait Training: 23-37 mins                     Verner Mould, DPT Acute Rehabilitation Services  Office [phone] Pager [phone]  04/28/2020 6:06 PM

## 2020-04-28 NOTE — Progress Notes (Signed)
PROGRESS NOTE    Cynthia Becker  TKZ:[id number] DOB: 1970-08-05 DOA: 04/24/2020 PCP: [name], NP    Brief Narrative:  [name] was admitted to the hospital with a working diagnosis of acute hypoxic respiratory failure due to community-acquired pneumonia.  49 year old female with obesity class III, dyslipidemia, asthma, hypertension, type 2 diabetes mellitus who presented with chest congestion. Symptoms present for about 2 to 3 days associated with chills, dyspnea and productive cough. Positive chest tightness and wheezing. On her initial physical examination blood pressure 158/80, heart rate 90, temperature 98.1, respiratory rate24, oxygen saturation 96% on supplemental oxygen. Her lungs had bilateral wheezing, no rhonchi or rails, heart S1-S2, present rhythmic, soft abdomen, no lower extremity edema. Sodium 139, potassium 4.3, chloride 97, bicarb 32, glucose 134, BUN 11, creatinine 0.82, white count 14.1, hemoglobin 14.7, hematocrit 45.8, platelets 327.  Chest radiograph, hypopenetrated, interstitial infiltrate left upper lobe and right lower lobe. CT chest with bilateral airspace opacities no pulmonary embolism.  Persistent hypoxemia on ambulation, down to 89%.  Patient has been placed on steroids for asthma exacerbation and added furosemide for acute diastolic heart failure decompensation, acute on chronic core pulmonale.    Assessment & Plan:   Principal Problem:   Community acquired bacterial pneumonia Active Problems:   Dyslipidemia   Depression   Asthma   OSA (obstructive sleep apnea)   Acute respiratory failure (HCC)   Essential hypertension   Class 3 obesity   1. Acute bilateral left upper lobe and right lower lobe community acquired pneumonia/ complicated with acute hypoxic respiratory failure. Symptoms improving but not yet back to baseline, she has intermittent wheezing.  Continue to use oxygen at 2 L/min per  with oxygen saturation 95% at rest. At  home she has nocturnal supplemental 02.   Continue with ceftriaxone and azithromycin #4/5.   Continue with bronchodilator therapy, airway clearing techniques and antitussive agents.  Out of bed to chair as tolerated. PT and OT.   2. Asthma exacerbation.continue to have wheezing and dyspnea.  Tolerating well  methylprednisolone 40 mg IV q 24 H. On montelukast.   Continue with bronchodilator therapy and inhaled corticosteroids.   3. Obesity class 3, OSA.  Acute on chronic diastolic heart failure, acute on chronic core pulmonale. BMI more than 40,  Echocardiogram with preserved LV systolic function, but positive diastolic dysfunction. Small pericardial effusion.   I suspect patient has pulmonary hypertension related to OSA and obesity class 3.  Will add 40 mg IV furosemide x1 and follow up on urine output.  Continue with spironolactone. Add 1200 ml fluid restriction and check strict in and out.  Patient has been on furosemide in the past.   4. T2DM/ dysplidemia. Her glucose continue to be well controlled, this am fasting is 110 mg/dl.  Continue with insulin sliding scale for glucose cover and monitoring  Continue with statin therapy.   5. HTN.Continue blood pressure control with  withisosorbide, hydralazine and spironolactone. Add furosemide x1 dose today.   6. Depression. Continue with quetiapine.  Patient continue to be at high risk for worsening respiratory failure.   Status is: Inpatient  Remains inpatient appropriate because:IV treatments appropriate due to intensity of illness or inability to take PO   Dispo: The patient is from: Home              Anticipated d/c is to: Home              Anticipated d/c date is: 2 days  Patient currently is not medically stable to d/c.   DVT prophylaxis: Heparin   Code Status:   full  Family Communication:  I spoke with patient's husband at the bedside, we talked in detail about patient's condition, plan of  care and prognosis and all questions were addressed.      Subjective: Patient continue to have dyspnea on exertion, intermittent wheezing, no nausea or vomiting. Moderate lower extremity edema,   Objective: Vitals:   04/27/20 1451 04/27/20 2054 04/28/20 0611 04/28/20 0726  BP: (!) 153/98 (!) 167/114 134/71   Pulse: 85 78 76   Resp: 18 16 16    Temp: 98.2 F (36.8 C) 99.2 F (37.3 C) 98.1 F (36.7 C)   TempSrc: Oral Oral Oral   SpO2: 90% 98% 94% 95%  Weight:      Height:        Intake/Output Summary (Last 24 hours) at 04/28/2020 1128 Last data filed at 04/27/2020 1345 Gross per 24 hour  Intake 240 ml  Output --  Net 240 ml   Filed Weights   04/24/20 1240 04/24/20 1409 04/24/20 2100  Weight: (!) 212.7 kg (!) 212.7 kg (!) 212.7 kg    Examination:   General: positive dyspnea at rest.  Neurology: Awake and alert, non focal  E ENT: no pallor, no icterus, oral mucosa moist Cardiovascular: No JVD. S1-S2 present, rhythmic, no gallops, rubs, or murmurs. No lower extremity edema. Pulmonary: positive breath sounds bilaterally, scattered expiratory  wheezing, with rhonchi or rales. Gastrointestinal. Abdomen protuberant, non tender. Soft.  Skin. No rashes Musculoskeletal: no joint deformities     Data Reviewed: I have personally reviewed following labs and imaging studies  CBC: Recent Labs  Lab 04/24/20 1320 04/25/20 0510 04/26/20 0537  WBC 14.1* 10.9* 8.4  NEUTROABS  --  6.4 4.2  HGB 14.7 13.5 14.1  HCT 45.8 41.9 44.2  MCV 79.1* 78.8* 78.2*  PLT 327 291 161   Basic Metabolic Panel: Recent Labs  Lab 04/24/20 1320 04/25/20 0510 04/26/20 0537 04/28/20 0545  NA 139 138 139 137  K 4.3 4.0 3.8 3.9  CL 97* 98 98 99  CO2 32 32 33* 27  GLUCOSE 134* 118* 110* 120*  BUN 11 10 10 14   CREATININE 0.82 0.74 0.90 0.77  CALCIUM 8.7* 8.5* 8.8* 8.9  MG  --  2.2  --   --    GFR: Estimated Creatinine Clearance: 158.4 mL/min (by C-G formula based on SCr of 0.77  mg/dL). Liver Function Tests: Recent Labs  Lab 04/25/20 0510  AST 17  ALT 18  ALKPHOS 67  BILITOT 0.6  PROT 7.1  ALBUMIN 3.0*   No results for input(s): LIPASE, AMYLASE in the last 168 hours. No results for input(s): AMMONIA in the last 168 hours. Coagulation Profile: No results for input(s): INR, PROTIME in the last 168 hours. Cardiac Enzymes: No results for input(s): CKTOTAL, CKMB, CKMBINDEX, TROPONINI in the last 168 hours. BNP (last 3 results) Recent Labs    10/18/19 1600  PROBNP 12   HbA1C: No results for input(s): HGBA1C in the last 72 hours. CBG: Recent Labs  Lab 04/27/20 0749 04/27/20 1237 04/27/20 1752 04/27/20 2054 04/28/20 0747  GLUCAP 115* 136* 176* 149* 110*   Lipid Profile: No results for input(s): CHOL, HDL, LDLCALC, TRIG, CHOLHDL, LDLDIRECT in the last 72 hours. Thyroid Function Tests: No results for input(s): TSH, T4TOTAL, FREET4, T3FREE, THYROIDAB in the last 72 hours. Anemia Panel: No results for input(s): VITAMINB12, FOLATE, FERRITIN, TIBC, IRON, RETICCTPCT  in the last 72 hours.    Radiology Studies: I have reviewed all of the imaging during this hospital visit personally     Scheduled Meds: . atorvastatin  10 mg Oral Daily  . azithromycin  500 mg Oral Daily  . fluticasone furoate-vilanterol  1 puff Inhalation Daily  . furosemide  40 mg Intravenous Once  . gabapentin  600 mg Oral TID  . heparin  5,000 Units Subcutaneous Q8H  . insulin aspart  0-20 Units Subcutaneous TID WC  . insulin aspart  0-5 Units Subcutaneous QHS  . isosorbide-hydrALAZINE  1 tablet Oral BID  . linaclotide  290 mcg Oral QAC breakfast  . loratadine  10 mg Oral Daily  . mouth rinse  15 mL Mouth Rinse BID  . methylPREDNISolone (SOLU-MEDROL) injection  40 mg Intravenous Daily  . montelukast  10 mg Oral QHS  . pantoprazole  40 mg Oral Daily  . QUEtiapine  400 mg Oral QHS  . spironolactone  50 mg Oral Daily   Continuous Infusions: . cefTRIAXone (ROCEPHIN)  IV 1  g (04/28/20 1008)     LOS: 4 days        Tarini Carrier Gerome Apley, MD

## 2020-04-29 DIAGNOSIS — I5032 Chronic diastolic (congestive) heart failure: Secondary | ICD-10-CM

## 2020-04-29 DIAGNOSIS — I5033 Acute on chronic diastolic (congestive) heart failure: Secondary | ICD-10-CM

## 2020-04-29 LAB — BASIC METABOLIC PANEL
Anion gap: 12 (ref 5–15)
BUN: 16 mg/dL (ref 6–20)
CO2: 29 mmol/L (ref 22–32)
Calcium: 8.9 mg/dL (ref 8.9–10.3)
Chloride: 96 mmol/L — ABNORMAL LOW (ref 98–111)
Creatinine, Ser: 0.81 mg/dL (ref 0.44–1.00)
GFR, Estimated: >60 mL/min (ref >60)
Glucose, Bld: 110 mg/dL — ABNORMAL HIGH (ref 70–99)
Potassium: 4.1 mmol/L (ref 3.5–5.1)
Sodium: 137 mmol/L (ref 135–145)

## 2020-04-29 LAB — GLUCOSE, CAPILLARY
Glucose-Capillary: 116 mg/dL — ABNORMAL HIGH (ref 70–99)
Glucose-Capillary: 150 mg/dL — ABNORMAL HIGH (ref 70–99)

## 2020-04-29 MED ORDER — GUAIFENESIN-DM 100-10 MG/5ML PO SYRP: 5.0000 mL | ORAL_SOLUTION | Freq: Four times a day (QID) | ORAL | 0 refills | Status: DC | PRN

## 2020-04-29 MED ORDER — LEVOFLOXACIN 750 MG PO TABS: 750.0000 mg | ORAL_TABLET | Freq: Every day | ORAL | Status: DC

## 2020-04-29 MED ORDER — LEVOFLOXACIN 500 MG PO TABS: 500.0000 mg | ORAL_TABLET | Freq: Every day | ORAL | Status: DC

## 2020-04-29 MED ORDER — LEVOFLOXACIN 750 MG PO TABS: 750.0000 mg | ORAL_TABLET | Freq: Every day | ORAL | 0 refills | Status: AC

## 2020-04-29 MED ORDER — FUROSEMIDE 40 MG PO TABS: 40.0000 mg | ORAL_TABLET | Freq: Every day | ORAL | 0 refills | Status: DC | PRN

## 2020-04-29 MED ORDER — IBUPROFEN 400 MG PO TABS
400.0000 mg | ORAL_TABLET | Freq: Three times a day (TID) | ORAL | 0 refills | Status: DC | PRN
Start: 2020-04-29 — End: 2020-06-21

## 2020-04-29 MED ORDER — FUROSEMIDE 40 MG PO TABS: 40.0000 mg | ORAL_TABLET | Freq: Every day | ORAL | Status: DC | PRN

## 2020-04-29 NOTE — Progress Notes (Signed)
CM received call from [facility] stating agency will be able to service patient for HHPT/OT and RN, however the RN services will be delayed by about a week.  MD notified of this change and VM left for patient/spouse.

## 2020-04-29 NOTE — Discharge Summary (Addendum)
Physician Discharge Summary  Cynthia Becker DQQ:[id number] DOB: 03-15-1971 DOA: 04/24/2020  PCP: [name], NP  Admit date: 04/24/2020 Discharge date: 04/29/2020  Admitted From: Home  Disposition:  Home   Recommendations for Outpatient Follow-up and new medication changes:  1. Follow up with Dionisio David NP in 7 days.  2. Continue levofloxacin for 3 more days.  3. Added furosemide 40 mg as needed for edema, dyspnea or wight gain 3 lbs in 24 H or 5 lbs in 7 days.  4. Reduced dose of ibuprofen to 400 mg as needed tid to prevent side effects.   Home Health: yes  Equipment/Devices: home 02 / walker    Discharge Condition: stable  CODE STATUS: full  Diet recommendation: heart healthy   Brief/Interim Summary: Cynthia Becker was admitted to the hospital with a working diagnosis of acute hypoxic respiratory failure due to community-acquired pneumonia, complicated with asthma exacerbation and acute on chronic diastolic heart failure exacerbation.  49 year old female with obesity class III, dyslipidemia, asthma, hypertension, and type 2 diabetes mellitus who presented with chest congestion. Symptoms present for about 2 to 3 days associated with chills, dyspnea and productive cough. Positive chest tightness and wheezing. On her initial physical examination blood pressure 158/80, heart rate 90, temperature 98.1, respiratory rate24, oxygen saturation 96% on supplemental oxygen. Her lungs had bilateral wheezing, no rhonchi or rales, heart S1-S2, present rhythmic, soft abdomen, no lower extremity edema. Sodium 139, potassium 4.3, chloride 97, bicarb 32, glucose 134, BUN 11, creatinine 0.82, white count 14.1, hemoglobin 14.7, hematocrit 45.8, platelets 327.  Chest radiograph, hypopenetrated, interstitial infiltrate left upper lobe and right lower lobe. CT chest with bilateral airspace opacities no pulmonary embolism.  Persistent hypoxemia on ambulation,down to 89%.  Patient has been placed  on steroids for asthma exacerbation and added furosemide for acute diastolic heart failure decompensation, acute on chronic core pulmonale.   1.  Acute left upper lobe and right lower lobe community-acquired pneumonia (present on admission), complicated with acute hypoxic respiratory failure.  Patient was admitted to the medical ward, she received supplemental oxygen per nasal cannula, antibiotic therapy with ceftriaxone and azithromycin. Bronchodilator therapy, airway clearing techniques and antitussive agents.  Patient will complete 3 more days of antibiotic therapy as an outpatient with levofloxacin.  At discharge oxygenation on 2 L/min per  at rest is 97%.   2.  Acute asthma exacerbation.  Patient had persistent wheezing and dyspnea along with oxygen desaturation on ambulation.  She received a short course of intravenous methylprednisolone, 40 mg daily with good toleration. At discharge she will continue bronchodilator therapy and inhaled corticosteroids. Continue montelukast.   3.  Acute on chronic diastolic heart failure exacerbation, acute on chronic cor pulmonale.  Suspected pulmonary hypertension related to obesity class III.  Patient underwent further work-up with echocardiography which showed preserved LV and RV systolic function, very small pericardial effusion, positive left ventricle diastolic dysfunction.  She was diuresed with intravenous furosemide, with further improvement of her symptoms, her urine output over the last 24 hours is 4100 mL.   At discharge continue blood pressure control, will add as needed furosemide, she was advised about further weight reduction.  4.  Obesity class III, obstructive sleep apnea.  Patient has nocturnal oxygen at home.  Her BMI is more than 40. She was advised about lifestyle modifications and weight reduction.  5.  Hypertension.  Continue blood pressure control with hydralazine, isosorbide and spironolactone.  6.  Depression.  Continue  quetiapine.  7. T2DM. Her glucose  remained well controlled, at discharge will resume glipizide.    Discharge Diagnoses:  Principal Problem:   Community acquired bacterial pneumonia Active Problems:   Dyslipidemia   Depression   Asthma   OSA (obstructive sleep apnea)   Acute respiratory failure (HCC)   Essential hypertension   Class 3 obesity   Acute on chronic diastolic CHF (congestive heart failure) ([place])    Discharge Instructions   Allergies as of 04/29/2020   No Known Allergies     Medication List    STOP taking these medications   fluticasone 50 MCG/ACT nasal spray Commonly known as: FLONASE   ondansetron 4 MG tablet Commonly known as: ZOFRAN     TAKE these medications   Accu-Chek FastClix Lancets Misc USE TO CHECK BLOOD SUGAR TWICE DAILY   Accu-Chek Guide test strip Generic drug: glucose blood USE 1 STRIP TWICE DAILY   albuterol 108 (90 Base) MCG/ACT inhaler Commonly known as: VENTOLIN HFA Inhale 2 puffs into the lungs every 6 (six) hours as needed for wheezing or shortness of breath. What changed: Another medication with the same name was changed. Make sure you understand how and when to take each.   albuterol (2.5 MG/3ML) 0.083% nebulizer solution Commonly known as: PROVENTIL USE 1 VIAL IN NEBULIZER EVERY 6 HOURS AS NEEDED FOR WHEEZING OR SHORTNESS OF BREATH What changed: See the new instructions.   BiDil 20-37.5 MG tablet Generic drug: isosorbide-hydrALAZINE Take 1 tablet by mouth in the morning and at bedtime.   cetirizine 10 MG tablet Commonly known as: ZyrTEC Allergy Take 1 tablet (10 mg total) by mouth daily.   chlorhexidine 0.12 % solution Commonly known as: PERIDEX Use as directed 5 mLs in the mouth or throat 2 (two) times daily as needed (mouth pain).   cyclobenzaprine 5 MG tablet Commonly known as: FLEXERIL Take 1 tablet (5 mg total) by mouth in the morning and at bedtime.   Fluticasone-Salmeterol 250-50 MCG/DOSE Aepb Commonly  known as: Advair Diskus Inhale 1 puff into the lungs 2 (two) times daily.   furosemide 40 MG tablet Commonly known as: LASIX Take 1 tablet (40 mg total) by mouth daily as needed for fluid or edema (as needed for lower extremty swelling, shortness of breath, or wight gain 3 lbs in 24 hours or 5 lbs in 7 days.).   gabapentin 600 MG tablet Commonly known as: NEURONTIN Take 1 tablet (600 mg total) by mouth 3 (three) times daily.   glipiZIDE 5 MG tablet Commonly known as: GLUCOTROL Take 1 tablet (5 mg total) by mouth 2 (two) times daily before a meal.   guaiFENesin-dextromethorphan 100-10 MG/5ML syrup Commonly known as: ROBITUSSIN DM Take 5 mLs by mouth every 6 (six) hours as needed for cough.   ibuprofen 400 MG tablet Commonly known as: ADVIL Take 1 tablet (400 mg total) by mouth every 8 (eight) hours as needed for moderate pain. Take on full stomach. What changed:   medication strength  how much to take   levofloxacin 750 MG tablet Commonly known as: LEVAQUIN Take 1 tablet (750 mg total) by mouth daily for 3 days. Start taking on: April 30, 2020   linaclotide 290 MCG Caps capsule Commonly known as: Linzess Take 1 capsule (290 mcg total) by mouth daily before breakfast.   montelukast 10 MG tablet Commonly known as: SINGULAIR Take 1 tablet (10 mg total) by mouth at bedtime.   omeprazole 20 MG capsule Commonly known as: PRILOSEC Take 20 mg by mouth at bedtime. What changed: Another  medication with the same name was removed. Continue taking this medication, and follow the directions you see here.   Oxycodone HCl 10 MG Tabs Take 10 mg by mouth every 6 (six) hours as needed (pain). Taking 3-4 times a day   QUEtiapine 400 MG tablet Commonly known as: SEROQUEL Take 400 mg by mouth at bedtime.   spironolactone 50 MG tablet Commonly known as: ALDACTONE Take 1 tablet (50 mg total) by mouth daily.            Durable Medical Equipment  (From admission, onward)          Start     Ordered   04/29/20 1035  For home use only DME oxygen  Once       Question Answer Comment  Length of Need 6 Months   Mode or (Route) Nasal cannula   Liters per Minute 2   Frequency Continuous (stationary and portable oxygen unit needed)   Oxygen conserving device Yes   Oxygen delivery system Gas      04/29/20 1035          Follow-up Information    [name], NP Follow up in 1 week(s).   Specialty: Adult Health Nurse Practitioner Contact information: [phone] [place] [id number] [phone]              No Known Allergies      Procedures/Studies: CT Angio Chest PE W and/or Wo Contrast  Result Date: 04/24/2020 CLINICAL DATA:  Chest pain for several days EXAM: CT ANGIOGRAPHY CHEST WITH CONTRAST TECHNIQUE: Multidetector CT imaging of the chest was performed using the standard protocol during bolus administration of intravenous contrast. Multiplanar CT image reconstructions and MIPs were obtained to evaluate the vascular anatomy. CONTRAST:  175mL OMNIPAQUE IOHEXOL 350 MG/ML SOLN COMPARISON:  Chest x-ray from earlier in the same day. FINDINGS: Cardiovascular: Thoracic aorta demonstrates a normal branching pattern. The overall degree of opacification is limited although no aneurysmal dilatation or dissection is seen. Mild cardiac enlargement is seen. The pulmonary artery is well visualized centrally although peripheral opacification is somewhat limited. No large central pulmonary embolism is seen. Mediastinum/Nodes: Thoracic inlet is within normal limits. Scattered small mediastinal and hilar lymph nodes are noted likely reactive in nature given the changes in the lungs. The esophagus as visualized is within normal limits mildly prominent lymph nodes are noted in the right axilla but with normal fatty hila likely reactive in nature. Lungs/Pleura: The lungs are well aerated bilaterally. Patchy airspace opacity is noted throughout both lungs consistent  with multifocal pneumonia. These changes are most prominent in the right middle, right lower and left upper lobes. No sizable effusion is seen. Upper Abdomen: Visualized upper abdomen appears within normal limits. Musculoskeletal: Degenerative changes of the thoracic spine are seen. No acute bony abnormality is noted. Review of the MIP images confirms the above findings. IMPRESSION: Bilateral airspace opacities  consistent with multifocal pneumonia. Somewhat limited opacification of the pulmonary artery related to the timing of the contrast bolus. No large central embolus is seen. Mildly prominent axillary lymph nodes particularly on the right. Correlate with site of recent flu shot injection. Electronically Signed   By: Inez Catalina M.D.   On: 04/24/2020 17:39   DG Chest Port 1 View  Result Date: 04/24/2020 CLINICAL DATA:  Chest pain. EXAM: PORTABLE CHEST 1 VIEW COMPARISON:  July 08, 2019. FINDINGS: Stable cardiomediastinal silhouette. Mild central pulmonary vascular congestion is noted. No pneumothorax or pleural effusion is noted.  No definite consolidative process is noted. Bony thorax is unremarkable. IMPRESSION: Mild central pulmonary vascular congestion. Electronically Signed   By: Marijo Conception M.D.   On: 04/24/2020 13:28   ECHOCARDIOGRAM COMPLETE  Result Date: 04/27/2020    ECHOCARDIOGRAM REPORT   Patient Name:   Veleria Mayr Date of Exam: 04/27/2020 Medical Rec #:  [id number]     Height:       65.0 in Accession #:    [id number]    Weight:       468.9 lb Date of Birth:  [birth year]    BSA:          2.841 m Patient Age:    52 years      BP:           136/87 mmHg Patient Gender: F             HR:           92 bpm. Exam Location:  Inpatient Procedure: 2D Echo, Color Doppler, Cardiac Doppler and Intracardiac            Opacification Agent Indications:    dyspnea  History:        Patient has prior history of Echocardiogram examinations, most                 recent 08/22/2019. Signs/Symptoms:Dyspnea;  Risk                 Factors:Hypertension, Diabetes and Dyslipidemia.  Sonographer:    Bernadene Person RDCS Referring Phys: [id number] [place]  1. Left ventricular ejection fraction, by estimation, is 60 to 65%. The left ventricle has normal function. The left ventricle has no regional wall motion abnormalities. There is moderate concentric left ventricular hypertrophy. Left ventricular diastolic parameters are consistent with Grade II diastolic dysfunction (pseudonormalization).  2. Right ventricular systolic function is normal. The right ventricular size is normal.  3. Left atrial size was mildly dilated.  4. The pericardial effusion is posterior to the left ventricle.  5. The mitral valve is normal in structure. Mild mitral valve regurgitation. No evidence of mitral stenosis.  6. The aortic valve is normal in structure. Aortic valve regurgitation is not visualized. No aortic stenosis is present.  7. The inferior vena cava is normal in size with greater than 50% respiratory variability, suggesting right atrial pressure of 3 mmHg. FINDINGS  Left Ventricle: Left ventricular ejection fraction, by estimation, is 60 to 65%. The left ventricle has normal function. The left ventricle has no regional wall motion abnormalities. Definity contrast agent was given IV to delineate the left ventricular  endocardial borders. The left ventricular internal cavity size was normal in size. There is moderate concentric left ventricular hypertrophy. Left ventricular diastolic parameters are consistent with Grade II diastolic dysfunction (pseudonormalization).  Normal left ventricular filling pressure. Right Ventricle: The right ventricular size is normal. No increase in right ventricular wall thickness. Right ventricular systolic function is normal. Left Atrium: Left atrial size was mildly dilated. Right Atrium: Right atrial size was normal in size. Pericardium: Trivial pericardial effusion is present. The  pericardial effusion is posterior to the left ventricle. Mitral Valve: The mitral valve is normal in structure. Mild mitral valve regurgitation. No evidence of mitral valve stenosis. Tricuspid Valve: The tricuspid valve is normal in structure. Tricuspid valve regurgitation is not demonstrated. No evidence of tricuspid stenosis. Aortic Valve: The aortic valve is normal in structure. Aortic valve regurgitation is not visualized. No aortic stenosis is present.  Pulmonic Valve: The pulmonic valve was normal in structure. Pulmonic valve regurgitation is not visualized. No evidence of pulmonic stenosis. Aorta: The aortic root is normal in size and structure. Venous: The inferior vena cava is normal in size with greater than 50% respiratory variability, suggesting right atrial pressure of 3 mmHg. IAS/Shunts: No atrial level shunt detected by color flow Doppler.  LEFT VENTRICLE PLAX 2D LVIDd:         5.00 cm  Diastology LVIDs:         3.20 cm  LV e' medial:    9.68 cm/s LV PW:         1.30 cm  LV E/e' medial:  10.8 LV IVS:        1.30 cm  LV e' lateral:   9.14 cm/s LVOT diam:     2.10 cm  LV E/e' lateral: 11.5 LV SV:         95 LV SV Index:   33 LVOT Area:     3.46 cm  RIGHT VENTRICLE RV S prime:     15.75 cm/s TAPSE (M-mode): 2.6 cm LEFT ATRIUM             Index       RIGHT ATRIUM           Index LA diam:        4.30 cm 1.51 cm/m  RA Area:     21.70 cm LA Vol (A2C):   49.7 ml 17.49 ml/m RA Volume:   58.80 ml  20.69 ml/m LA Vol (A4C):   58.6 ml 20.62 ml/m LA Biplane Vol: 58.0 ml 20.41 ml/m  AORTIC VALVE LVOT Vmax:   134.00 cm/s LVOT Vmean:  102.000 cm/s LVOT VTI:    0.273 m  AORTA Ao Root diam: 3.10 cm Ao Asc diam:  3.35 cm MITRAL VALVE MV Area (PHT): 3.53 cm     SHUNTS MV Decel Time: 215 msec     Systemic VTI:  0.27 m MV E velocity: 105.00 cm/s  Systemic Diam: 2.10 cm MV A velocity: 72.80 cm/s MV E/A ratio:  1.44 Ena Dawley MD Electronically signed by Ena Dawley MD Signature Date/Time: 04/27/2020/1:05:09 PM     Final         Subjective: Patient is feeling better, dyspnea has improved, no nausea or vomiting,.   Discharge Exam: Vitals:   04/29/20 0601 04/29/20 0719  BP: (!) 137/112   Pulse: 79   Resp: 15   Temp: 98 F (36.7 C)   SpO2: 93% 97%   Vitals:   04/28/20 1525 04/28/20 2025 04/29/20 0601 04/29/20 0719  BP: (!) 150/92 (!) 171/95 (!) 137/112   Pulse: 92 87 79   Resp: 17 16 15    Temp: 97.9 F (36.6 C) 98.8 F (37.1 C) 98 F (36.7 C)   TempSrc: Oral Oral Oral   SpO2: 94% 92% 93% 97%  Weight:      Height:        General: Not in pain or dyspnea.  Neurology: Awake and alert, non focal  E ENT: mild pallor, no icterus, oral mucosa moist Cardiovascular: No JVD. S1-S2 present, rhythmic, no gallops, rubs, or murmurs. ++ non pitting lower extremity edema. Pulmonary:  Positive breath sounds bilaterally, with no wheezing, rhonchi or rales. Gastrointestinal. Abdomen soft and non tender, protuberant. Skin. No rashes Musculoskeletal: no joint deformities   The results of significant diagnostics from this hospitalization (including imaging, microbiology, ancillary and laboratory) are listed below for reference.  Microbiology: Recent Results (from the past 240 hour(s))  Respiratory Panel by RT PCR (Flu A&B, Covid) - Nasopharyngeal Swab     Status: None   Collection Time: 04/24/20  1:39 PM   Specimen: Nasopharyngeal Swab  Result Value Ref Range Status   SARS Coronavirus 2 by RT PCR NEGATIVE NEGATIVE Final    Comment: (NOTE) SARS-CoV-2 target nucleic acids are NOT DETECTED.  The SARS-CoV-2 RNA is generally detectable in upper respiratoy specimens during the acute phase of infection. The lowest concentration of SARS-CoV-2 viral copies this assay can detect is 131 copies/mL. A negative result does not preclude SARS-Cov-2 infection and should not be used as the sole basis for treatment or other patient management decisions. A negative result may occur with  improper  specimen collection/handling, submission of specimen other than nasopharyngeal swab, presence of viral mutation(s) within the areas targeted by this assay, and inadequate number of viral copies (<131 copies/mL). A negative result must be combined with clinical observations, patient history, and epidemiological information. The expected result is Negative.  Fact Sheet for Patients:  PinkCheek.be  Fact Sheet for Healthcare Providers:  GravelBags.it  This test is no t yet approved or cleared by the [place] FDA and  has been authorized for detection and/or diagnosis of SARS-CoV-2 by FDA under an Emergency Use Authorization (EUA). This EUA will remain  in effect (meaning this test can be used) for the duration of the COVID-19 declaration under Section 564(b)(1) of the Act, 21 U.S.C. section 360bbb-3(b)(1), unless the authorization is terminated or revoked sooner.     Influenza A by PCR NEGATIVE NEGATIVE Final   Influenza B by PCR NEGATIVE NEGATIVE Final    Comment: (NOTE) The Xpert Xpress SARS-CoV-2/FLU/RSV assay is intended as an aid in  the diagnosis of influenza from Nasopharyngeal swab specimens and  should not be used as a sole basis for treatment. Nasal washings and  aspirates are unacceptable for Xpert Xpress SARS-CoV-2/FLU/RSV  testing.  Fact Sheet for Patients: PinkCheek.be  Fact Sheet for Healthcare Providers: GravelBags.it  This test is not yet approved or cleared by the [place] FDA and  has been authorized for detection and/or diagnosis of SARS-CoV-2 by  FDA under an Emergency Use Authorization (EUA). This EUA will remain  in effect (meaning this test can be used) for the duration of the  Covid-19 declaration under Section 564(b)(1) of the Act, 21  U.S.C. section 360bbb-3(b)(1), unless the authorization is  terminated or revoked. Performed at  [facility], [place] [address] [place], [place] [address]   Culture, sputum-assessment     Status: None   Collection Time: 04/25/20 10:21 AM   Specimen: Expectorated Sputum  Result Value Ref Range Status   Specimen Description EXPECTORATED SPUTUM  Final   Special Requests NONE  Final   Sputum evaluation   Final    THIS SPECIMEN IS ACCEPTABLE FOR SPUTUM CULTURE Performed at [facility], [place] [address] [place], [place] [address]    Report Status 04/25/2020 FINAL  Final  Culture, respiratory     Status: None   Collection Time: 04/25/20 10:21 AM  Result Value Ref Range Status   Specimen Description   Final    EXPECTORATED SPUTUM Performed at [place] [address] [place], [place] [address]    Special Requests   Final    NONE Reflexed from [id number] Performed at [facility], [place] [address] [place], [place] [address]    Gram Stain  Final    RARE WBC PRESENT,BOTH PMN AND MONONUCLEAR FEW SQUAMOUS EPITHELIAL CELLS PRESENT FEW GRAM POSITIVE RODS RARE GRAM POSITIVE COCCI IN PAIRS    Culture   Final    RARE Consistent with normal respiratory flora. No Pseudomonas species isolated Performed at [place] [address], [place], [place] [address]    Report Status 04/27/2020 FINAL  Final     Labs: BNP (last 3 results) Recent Labs    07/08/19 2054 04/19/20 1546 04/24/20 1320  BNP 93.1 <2.5 73.2   Basic Metabolic Panel: Recent Labs  Lab 04/24/20 1320 04/25/20 0510 04/26/20 0537 04/28/20 0545 04/29/20 0743  NA 139 138 139 137 137  K 4.3 4.0 3.8 3.9 4.1  CL 97* 98 98 99 96*  CO2 32 32 33* 27 29  GLUCOSE 134* 118* 110* 120* 110*  BUN 11 10 10 14 16   CREATININE 0.82 0.74 0.90 0.77 0.81  CALCIUM 8.7* 8.5* 8.8* 8.9 8.9  MG  --  2.2  --   --   --    Liver Function Tests: Recent Labs  Lab 04/25/20 0510  AST 17  ALT 18  ALKPHOS 67  BILITOT 0.6  PROT 7.1  ALBUMIN 3.0*   No  results for input(s): LIPASE, AMYLASE in the last 168 hours. No results for input(s): AMMONIA in the last 168 hours. CBC: Recent Labs  Lab 04/24/20 1320 04/25/20 0510 04/26/20 0537  WBC 14.1* 10.9* 8.4  NEUTROABS  --  6.4 4.2  HGB 14.7 13.5 14.1  HCT 45.8 41.9 44.2  MCV 79.1* 78.8* 78.2*  PLT 327 291 293   Cardiac Enzymes: No results for input(s): CKTOTAL, CKMB, CKMBINDEX, TROPONINI in the last 168 hours. BNP: Invalid input(s): POCBNP CBG: Recent Labs  Lab 04/28/20 0747 04/28/20 1200 04/28/20 1702 04/28/20 2020 04/29/20 0730  GLUCAP 110* 187* 163* 199* 116*   D-Dimer No results for input(s): DDIMER in the last 72 hours. Hgb A1c No results for input(s): HGBA1C in the last 72 hours. Lipid Profile No results for input(s): CHOL, HDL, LDLCALC, TRIG, CHOLHDL, LDLDIRECT in the last 72 hours. Thyroid function studies No results for input(s): TSH, T4TOTAL, T3FREE, THYROIDAB in the last 72 hours.  Invalid input(s): FREET3 Anemia work up No results for input(s): VITAMINB12, FOLATE, FERRITIN, TIBC, IRON, RETICCTPCT in the last 72 hours. Urinalysis    Component Value Date/Time   COLORURINE YELLOW 02/21/2012 1647   APPEARANCEUR CLOUDY (A) 02/21/2012 1647   LABSPEC 1.021 02/21/2012 1647   PHURINE 6.0 02/21/2012 1647   GLUCOSEU NEGATIVE 02/21/2012 1647   HGBUR NEGATIVE 02/21/2012 1647   HGBUR negative 08/02/2010 1448   BILIRUBINUR neg 04/19/2020 1454   KETONESUR NEGATIVE 02/21/2012 1647   PROTEINUR Negative 04/19/2020 1454   PROTEINUR NEGATIVE 02/21/2012 1647   UROBILINOGEN 0.2 04/19/2020 1454   UROBILINOGEN 1.0 02/21/2012 1647   NITRITE neg 04/19/2020 1454   NITRITE NEGATIVE 02/21/2012 1647   LEUKOCYTESUR Negative 04/19/2020 1454   Sepsis Labs Invalid input(s): PROCALCITONIN,  WBC,  LACTICIDVEN Microbiology Recent Results (from the past 240 hour(s))  Respiratory Panel by RT PCR (Flu A&B, Covid) - Nasopharyngeal Swab     Status: None   Collection Time: 04/24/20  1:39  PM   Specimen: Nasopharyngeal Swab  Result Value Ref Range Status   SARS Coronavirus 2 by RT PCR NEGATIVE NEGATIVE Final    Comment: (NOTE) SARS-CoV-2 target nucleic acids are NOT DETECTED.  The SARS-CoV-2 RNA is generally detectable in upper respiratoy specimens during the acute phase  of infection. The lowest concentration of SARS-CoV-2 viral copies this assay can detect is 131 copies/mL. A negative result does not preclude SARS-Cov-2 infection and should not be used as the sole basis for treatment or other patient management decisions. A negative result may occur with  improper specimen collection/handling, submission of specimen other than nasopharyngeal swab, presence of viral mutation(s) within the areas targeted by this assay, and inadequate number of viral copies (<131 copies/mL). A negative result must be combined with clinical observations, patient history, and epidemiological information. The expected result is Negative.  Fact Sheet for Patients:  PinkCheek.be  Fact Sheet for Healthcare Providers:  GravelBags.it  This test is no t yet approved or cleared by the [place] FDA and  has been authorized for detection and/or diagnosis of SARS-CoV-2 by FDA under an Emergency Use Authorization (EUA). This EUA will remain  in effect (meaning this test can be used) for the duration of the COVID-19 declaration under Section 564(b)(1) of the Act, 21 U.S.C. section 360bbb-3(b)(1), unless the authorization is terminated or revoked sooner.     Influenza A by PCR NEGATIVE NEGATIVE Final   Influenza B by PCR NEGATIVE NEGATIVE Final    Comment: (NOTE) The Xpert Xpress SARS-CoV-2/FLU/RSV assay is intended as an aid in  the diagnosis of influenza from Nasopharyngeal swab specimens and  should not be used as a sole basis for treatment. Nasal washings and  aspirates are unacceptable for Xpert Xpress SARS-CoV-2/FLU/RSV   testing.  Fact Sheet for Patients: PinkCheek.be  Fact Sheet for Healthcare Providers: GravelBags.it  This test is not yet approved or cleared by the [place] FDA and  has been authorized for detection and/or diagnosis of SARS-CoV-2 by  FDA under an Emergency Use Authorization (EUA). This EUA will remain  in effect (meaning this test can be used) for the duration of the  Covid-19 declaration under Section 564(b)(1) of the Act, 21  U.S.C. section 360bbb-3(b)(1), unless the authorization is  terminated or revoked. Performed at [facility], [place] [address] [place], [place] [address]   Culture, sputum-assessment     Status: None   Collection Time: 04/25/20 10:21 AM   Specimen: Expectorated Sputum  Result Value Ref Range Status   Specimen Description EXPECTORATED SPUTUM  Final   Special Requests NONE  Final   Sputum evaluation   Final    THIS SPECIMEN IS ACCEPTABLE FOR SPUTUM CULTURE Performed at [facility], [place] [address], [place], [place] [address]    Report Status 04/25/2020 FINAL  Final  Culture, respiratory     Status: None   Collection Time: 04/25/20 10:21 AM  Result Value Ref Range Status   Specimen Description   Final    EXPECTORATED SPUTUM Performed at [place] [address] [place], [place] [address]    Special Requests   Final    NONE Reflexed from [id number] Performed at [facility], [place] [address] [place], [place] [address]    Gram Stain   Final    RARE WBC PRESENT,BOTH PMN AND MONONUCLEAR FEW SQUAMOUS EPITHELIAL CELLS PRESENT FEW GRAM POSITIVE RODS RARE GRAM POSITIVE COCCI IN PAIRS    Culture   Final    RARE Consistent with normal respiratory flora. No Pseudomonas species isolated Performed at [place] [address] [place], [place] [address]    Report Status 04/27/2020 FINAL  Final     Time  coordinating discharge: 45 minutes  SIGNED:   Jimmy Picket  Rhett Mutschler, MD  [facility] 04/29/2020, 10:37 AM

## 2020-04-29 NOTE — TOC Progression Note (Addendum)
Transition of Care [facility]) - Progression Note    Patient Details  Name: Cynthia Becker MRN: [id number] Date of Birth: Jan 03, 1971  [facility] CM/SW Contact  Joaquin Courts, RN Phone Number: 04/29/2020, 12:42 PM  Clinical Narrative:  CM reached out to all agencies servicing the [id number] area.  Agencies include [facility], [facility], [facility], [facility], [facility], [facility], [facility], [facility], and [facility].  At this time none of the agencies are able to provide [facility] services.  MD notified and voicemail left for patient's spouse with this information. Patient does have home O2 and spouse will refill portable tank and bring it with him for transport home.  Referral placed for Outpatient PT services.     Expected Discharge Plan: [place] Barriers to Discharge: No Barriers Identified  Expected Discharge Plan and Services Expected Discharge Plan: [place]   Discharge Planning Services: CM Consult Post Acute Care Choice: [place] arrangements for the past 2 months: Single Family Home Expected Discharge Date: 04/29/20                                     Social Determinants of Health (SDOH) Interventions    Readmission Risk Interventions No flowsheet data found.

## 2020-05-01 ENCOUNTER — Other Ambulatory Visit (HOSPITAL_COMMUNITY)
Admission: RE | Admit: 2020-05-01 | Discharge: 2020-05-01 | Disposition: A | Discharge status: Home / Self Care | Payer: Medicaid Other | Source: Ambulatory Visit | Attending: Primary Care | Admitting: Primary Care

## 2020-05-01 DIAGNOSIS — Z01818 Encounter for other preprocedural examination: Secondary | ICD-10-CM | POA: Diagnosis not present

## 2020-05-01 DIAGNOSIS — Z20822 Contact with and (suspected) exposure to covid-19: Secondary | ICD-10-CM | POA: Insufficient documentation

## 2020-05-01 LAB — SARS CORONAVIRUS 2 (TAT 6-24 HRS): SARS Coronavirus 2: NEGATIVE

## 2020-05-05 ENCOUNTER — Ambulatory Visit: Payer: Medicaid Other | Admitting: Primary Care

## 2020-05-23 ENCOUNTER — Other Ambulatory Visit (HOSPITAL_COMMUNITY)
Admission: RE | Admit: 2020-05-23 | Discharge: 2020-05-23 | Disposition: A | Discharge status: Home / Self Care | Payer: Medicaid Other | Source: Ambulatory Visit | Attending: Critical Care Medicine | Admitting: Critical Care Medicine

## 2020-05-23 DIAGNOSIS — Z01812 Encounter for preprocedural laboratory examination: Secondary | ICD-10-CM | POA: Insufficient documentation

## 2020-05-23 DIAGNOSIS — Z20822 Contact with and (suspected) exposure to covid-19: Secondary | ICD-10-CM | POA: Insufficient documentation

## 2020-05-23 LAB — SARS CORONAVIRUS 2 (TAT 6-24 HRS): SARS Coronavirus 2: NEGATIVE

## 2020-05-26 ENCOUNTER — Ambulatory Visit (INDEPENDENT_AMBULATORY_CARE_PROVIDER_SITE_OTHER): Payer: Medicaid Other | Admitting: Critical Care Medicine

## 2020-05-26 ENCOUNTER — Other Ambulatory Visit: Payer: Self-pay

## 2020-05-26 DIAGNOSIS — J309 Allergic rhinitis, unspecified: Secondary | ICD-10-CM

## 2020-05-26 LAB — PULMONARY FUNCTION TEST
DL/VA % pred: 133 %
DL/VA: 5.69 ml/min/mmHg/L
DLCO cor % pred: 90 %
DLCO cor: 19.8 ml/min/mmHg
DLCO unc % pred: 90 %
DLCO unc: 19.8 ml/min/mmHg
FEF 25-75 Post: 4.3 L/sec
FEF 25-75 Pre: 3.26 L/sec
FEF2575-%Change-Post: 31 %
FEF2575-%Pred-Post: 170 %
FEF2575-%Pred-Pre: 129 %
FEV1-%Change-Post: 8 %
FEV1-%Pred-Post: 93 %
FEV1-%Pred-Pre: 85 %
FEV1-Post: 2.25 L
FEV1-Pre: 2.07 L
FEV1FVC-%Change-Post: 4 %
FEV1FVC-%Pred-Pre: 107 %
FEV6-%Change-Post: 4 %
FEV6-%Pred-Post: 83 %
FEV6-%Pred-Pre: 79 %
FEV6-Post: 2.45 L
FEV6-Pre: 2.34 L
FEV6FVC-%Change-Post: 0 %
FEV6FVC-%Pred-Post: 102 %
FEV6FVC-%Pred-Pre: 102 %
FVC-%Change-Post: 4 %
FVC-%Pred-Post: 81 %
FVC-%Pred-Pre: 78 %
FVC-Post: 2.45 L
FVC-Pre: 2.35 L
Post FEV1/FVC ratio: 92 %
Post FEV6/FVC ratio: 100 %
Pre FEV1/FVC ratio: 88 %
Pre FEV6/FVC Ratio: 99 %

## 2020-05-26 NOTE — Progress Notes (Signed)
PFT done today. 

## 2020-05-29 ENCOUNTER — Ambulatory Visit (INDEPENDENT_AMBULATORY_CARE_PROVIDER_SITE_OTHER): Payer: Medicaid Other | Admitting: Primary Care

## 2020-05-29 ENCOUNTER — Other Ambulatory Visit: Payer: Self-pay

## 2020-05-29 ENCOUNTER — Encounter: Payer: Self-pay | Admitting: Primary Care

## 2020-05-29 ENCOUNTER — Ambulatory Visit: Payer: Medicaid Other

## 2020-05-29 DIAGNOSIS — J329 Chronic sinusitis, unspecified: Secondary | ICD-10-CM | POA: Diagnosis not present

## 2020-05-29 DIAGNOSIS — J159 Unspecified bacterial pneumonia: Secondary | ICD-10-CM

## 2020-05-29 NOTE — Progress Notes (Signed)
Virtual Visit via Telephone Note  I connected with Cynthia Becker on 05/29/20 at  3:30 PM EST by telephone and verified that I am speaking with the correct person using two identifiers.  Location: Patient: Home Provider: Office   I discussed the limitations, risks, security and privacy concerns of performing an evaluation and management service by telephone and the availability of in person appointments. I also discussed with the patient that there may be a patient responsible charge related to this service. The patient expressed understanding and agreed to proceed.   History of Present Illness: 49 year old female, current some day smoker. PMH allergic asthma, OSA, HTN, obesity. Patient of Dr. Carlis Abbott, last seen on 02/22/20. Treated for acute exacerbation with Augmentin. Maintained on Advair twice daily.  Eosinophils 400, IgE 198 in June 2021. Still needs PFTs.  Previous [facility] pulmonary encounters:  03/17/2020 Patient presents today for 3-4 weeks follow-up. She is not feeling better after completing course of Augmentin. Antibiotic did help her sinusitis symptoms a little. She has some residual chest congestion, cough with yellow mucus and shortness of breath. She is compliant with Advair twice daily and Singulair. She hasn't notice a significant improvement since starting ICS/LABA in June. She uses ventolin inhaler upwards of 3 times a day with improvement. Flonase causes her to have nose bleeds and ocean nasal spray did not help. She was previous on allergy shots.   Cardiologist referred her to bariatric surgery. This was on hold d/t covid. She reports increased lower extremity swelling. Her BNP in April was normal. She is currently on Spirnolactone 50mg  daily. Sleep study showed no evidence of OSA. AHI 3.1. SpO2 low 79%, required 1 L supplemental oxygen at night.    05/29/2020- [facility]  Patient contacted today to discuss PFT results. Dx community pneumonia in November 2021, she was hospitalized for a  week. She is doing some better. She hasn't smoked in the last month. Chest still feels heavy and she still has a np cough. She still have nasal congestion. She can not use flonase d/t prior nose bleeds, reports saline nasal spray did not help. She has only been using Advair once a day. Using albuterol every other day, which is improved. She wears 1L oxygen at night, she hasn't been requiring oxygen during the day reports O2 level 93-96% RA. She was started on Lasix during most recent hospitalization as needed for weight gain. She last took lasix Saturday 05/27/20 and reports good urinary output. She sees pain management for chronic back pain. She is receiving nursing and PT at home.   Observations/Objective:  - Able to speak in full sentences; no overt shortness of breath, wheezing or cough - O2 93-96% RA   PFTs 05/26/20- FVC 2.45 (81%), FEV1 2.25 (93%), ratio 92, DLCOunc 90% Mild restriction without reversibility. Decreased lung volumes. Normal diffusion capacity, however, not correlated for patients hemoglobin.   Assessment and Plan:  CAP: - Hospitalized early November 1 for CAP, treated with IV ceftriaxone/Azithromycin and oral Levaquin - She is doing some better, has residual chest heaviness and np cough - Needs follow-up CXR to ensure resolution   Allergic asthma: - Reinforced importance of using Advair TWICE a day - Eosinophils 400, IgE 198 in June 2021 - PFTs showed mild restriction, no significant BD response. Decreased lung volumes.  Chronic sinusitis: - Refer to ENT re: chronic sinuses   Nocturnal hypoxia: - Sleep study showed no evidence of OSA. AHI 3.1. SpO2 low 79%, required 1 L supplemental oxygen at night.  Heart failure - Continue to monitor daily weights, if > 3 lbs in 24 hrs or 5 lbs in 7 days take prn lasix 40mg    Follow Up Instructions:  - FU in 3 months with either Dr. Erin Fulling or Shearon Stalls (new patient asthma)   I discussed the assessment and treatment plan with  the patient. The patient was provided an opportunity to ask questions and all were answered. The patient agreed with the plan and demonstrated an understanding of the instructions.   The patient was advised to call back or seek an in-person evaluation if the symptoms worsen or if the condition fails to improve as anticipated.  I provided 25 minutes of non-face-to-face time during this encounter.   Martyn Ehrich, NP

## 2020-05-29 NOTE — Patient Instructions (Addendum)
Recommendations: - Use Advair inhaler TWICE a day - Continue to wear 1L oxygen at night time  - Take lasix 40mg  every other day for the next week; then as needed - Monitor daily weights if >3 lbs in 24 hrs or >5 lbs in 7 days take lasix   Orders: - Repeat CXR Re: follow-up community acquired pneumonia   Referral: - ENT Re: chronic sinusitis   Follow-up: - 3 months fu with either Dr. Erin Fulling or Shearon Stalls (new patient asthma) *Dr. Carlis Abbott is not seeing patient in the office anymore, she will primarily be in the hospital

## 2020-06-01 ENCOUNTER — Telehealth: Payer: Self-pay | Admitting: Nurse Practitioner

## 2020-06-01 NOTE — Telephone Encounter (Signed)
Paperwork has been fax and sent

## 2020-06-02 NOTE — Progress Notes (Signed)
Agree, thanks for seeing her!  Julian Hy, DO 06/02/20 6:18 PM [facility]

## 2020-06-13 ENCOUNTER — Other Ambulatory Visit: Payer: Self-pay | Admitting: Anesthesiology

## 2020-06-13 ENCOUNTER — Ambulatory Visit
Admission: RE | Admit: 2020-06-13 | Discharge: 2020-06-13 | Disposition: A | Discharge status: Home / Self Care | Payer: Medicaid Other | Source: Ambulatory Visit | Attending: Anesthesiology | Admitting: Anesthesiology

## 2020-06-13 DIAGNOSIS — M170 Bilateral primary osteoarthritis of knee: Secondary | ICD-10-CM

## 2020-06-14 ENCOUNTER — Other Ambulatory Visit: Payer: Self-pay | Admitting: Anesthesiology

## 2020-06-14 DIAGNOSIS — S161XXA Strain of muscle, fascia and tendon at neck level, initial encounter: Secondary | ICD-10-CM

## 2020-06-14 DIAGNOSIS — M5440 Lumbago with sciatica, unspecified side: Secondary | ICD-10-CM

## 2020-06-21 ENCOUNTER — Other Ambulatory Visit: Payer: Self-pay | Admitting: Family Medicine

## 2020-06-21 ENCOUNTER — Telehealth: Payer: Self-pay | Admitting: Nurse Practitioner

## 2020-06-21 MED ORDER — IBUPROFEN 400 MG PO TABS: 400.0000 mg | ORAL_TABLET | Freq: Three times a day (TID) | ORAL | 0 refills | Status: DC | PRN

## 2020-06-21 NOTE — Telephone Encounter (Signed)
Done

## 2020-06-24 ENCOUNTER — Other Ambulatory Visit: Payer: Self-pay | Admitting: Nurse Practitioner

## 2020-06-27 NOTE — Telephone Encounter (Signed)
Is this okay to refill? 

## 2020-07-05 ENCOUNTER — Other Ambulatory Visit: Payer: Medicaid Other

## 2020-07-21 ENCOUNTER — Inpatient Hospital Stay: Admission: RE | Admit: 2020-07-21 | Payer: Medicaid Other | Source: Ambulatory Visit

## 2020-07-21 ENCOUNTER — Ambulatory Visit: Payer: Medicaid Other

## 2020-07-21 ENCOUNTER — Other Ambulatory Visit: Payer: Self-pay | Admitting: Nurse Practitioner

## 2020-07-21 ENCOUNTER — Other Ambulatory Visit: Payer: Self-pay | Admitting: Family Medicine

## 2020-07-21 DIAGNOSIS — M5126 Other intervertebral disc displacement, lumbar region: Secondary | ICD-10-CM

## 2020-07-21 DIAGNOSIS — M79604 Pain in right leg: Secondary | ICD-10-CM

## 2020-09-01 ENCOUNTER — Other Ambulatory Visit: Payer: Self-pay | Admitting: Nurse Practitioner

## 2020-09-15 ENCOUNTER — Other Ambulatory Visit: Payer: Self-pay | Admitting: Family Medicine

## 2020-09-25 ENCOUNTER — Other Ambulatory Visit: Payer: Self-pay

## 2020-09-25 MED ORDER — CYCLOBENZAPRINE HCL 5 MG PO TABS: 5.0000 mg | ORAL_TABLET | Freq: Three times a day (TID) | ORAL | 0 refills | Status: DC | PRN

## 2020-10-02 ENCOUNTER — Encounter (HOSPITAL_COMMUNITY): Payer: Self-pay

## 2020-10-02 ENCOUNTER — Other Ambulatory Visit: Payer: Self-pay

## 2020-10-02 ENCOUNTER — Emergency Department (HOSPITAL_COMMUNITY): Payer: Medicaid Other

## 2020-10-02 ENCOUNTER — Inpatient Hospital Stay (HOSPITAL_COMMUNITY)
Admission: EM | Admit: 2020-10-02 | Discharge: 2020-10-07 | DRG: 291 | Disposition: A | Discharge status: Home / Self Care | Payer: Medicaid Other | Attending: Internal Medicine | Admitting: Internal Medicine

## 2020-10-02 DIAGNOSIS — Z801 Family history of malignant neoplasm of trachea, bronchus and lung: Secondary | ICD-10-CM

## 2020-10-02 DIAGNOSIS — J9601 Acute respiratory failure with hypoxia: Secondary | ICD-10-CM | POA: Diagnosis present

## 2020-10-02 DIAGNOSIS — Z20822 Contact with and (suspected) exposure to covid-19: Secondary | ICD-10-CM | POA: Diagnosis present

## 2020-10-02 DIAGNOSIS — J45901 Unspecified asthma with (acute) exacerbation: Secondary | ICD-10-CM

## 2020-10-02 DIAGNOSIS — J9811 Atelectasis: Secondary | ICD-10-CM | POA: Diagnosis present

## 2020-10-02 DIAGNOSIS — E785 Hyperlipidemia, unspecified: Secondary | ICD-10-CM | POA: Diagnosis present

## 2020-10-02 DIAGNOSIS — Z7409 Other reduced mobility: Secondary | ICD-10-CM | POA: Diagnosis present

## 2020-10-02 DIAGNOSIS — Z7984 Long term (current) use of oral hypoglycemic drugs: Secondary | ICD-10-CM

## 2020-10-02 DIAGNOSIS — I110 Hypertensive heart disease with heart failure: Secondary | ICD-10-CM | POA: Diagnosis present

## 2020-10-02 DIAGNOSIS — M1990 Unspecified osteoarthritis, unspecified site: Secondary | ICD-10-CM | POA: Diagnosis present

## 2020-10-02 DIAGNOSIS — I2721 Secondary pulmonary arterial hypertension: Secondary | ICD-10-CM | POA: Diagnosis present

## 2020-10-02 DIAGNOSIS — Z79899 Other long term (current) drug therapy: Secondary | ICD-10-CM

## 2020-10-02 DIAGNOSIS — Z833 Family history of diabetes mellitus: Secondary | ICD-10-CM | POA: Diagnosis not present

## 2020-10-02 DIAGNOSIS — D649 Anemia, unspecified: Secondary | ICD-10-CM | POA: Diagnosis present

## 2020-10-02 DIAGNOSIS — G8929 Other chronic pain: Secondary | ICD-10-CM | POA: Diagnosis present

## 2020-10-02 DIAGNOSIS — I5033 Acute on chronic diastolic (congestive) heart failure: Secondary | ICD-10-CM | POA: Diagnosis present

## 2020-10-02 DIAGNOSIS — F32A Depression, unspecified: Secondary | ICD-10-CM | POA: Diagnosis present

## 2020-10-02 DIAGNOSIS — Z8249 Family history of ischemic heart disease and other diseases of the circulatory system: Secondary | ICD-10-CM

## 2020-10-02 DIAGNOSIS — K5900 Constipation, unspecified: Secondary | ICD-10-CM | POA: Diagnosis present

## 2020-10-02 DIAGNOSIS — E1142 Type 2 diabetes mellitus with diabetic polyneuropathy: Secondary | ICD-10-CM | POA: Diagnosis present

## 2020-10-02 DIAGNOSIS — I10 Essential (primary) hypertension: Secondary | ICD-10-CM

## 2020-10-02 DIAGNOSIS — J189 Pneumonia, unspecified organism: Secondary | ICD-10-CM

## 2020-10-02 DIAGNOSIS — Z825 Family history of asthma and other chronic lower respiratory diseases: Secondary | ICD-10-CM

## 2020-10-02 DIAGNOSIS — Z8701 Personal history of pneumonia (recurrent): Secondary | ICD-10-CM | POA: Diagnosis not present

## 2020-10-02 DIAGNOSIS — J9621 Acute and chronic respiratory failure with hypoxia: Secondary | ICD-10-CM

## 2020-10-02 DIAGNOSIS — G4733 Obstructive sleep apnea (adult) (pediatric): Secondary | ICD-10-CM | POA: Diagnosis present

## 2020-10-02 DIAGNOSIS — E0843 Diabetes mellitus due to underlying condition with diabetic autonomic (poly)neuropathy: Secondary | ICD-10-CM | POA: Diagnosis not present

## 2020-10-02 DIAGNOSIS — M549 Dorsalgia, unspecified: Secondary | ICD-10-CM | POA: Diagnosis present

## 2020-10-02 DIAGNOSIS — J45909 Unspecified asthma, uncomplicated: Secondary | ICD-10-CM | POA: Diagnosis present

## 2020-10-02 DIAGNOSIS — F419 Anxiety disorder, unspecified: Secondary | ICD-10-CM | POA: Diagnosis present

## 2020-10-02 DIAGNOSIS — K219 Gastro-esophageal reflux disease without esophagitis: Secondary | ICD-10-CM | POA: Diagnosis present

## 2020-10-02 DIAGNOSIS — I890 Lymphedema, not elsewhere classified: Secondary | ICD-10-CM | POA: Diagnosis present

## 2020-10-02 DIAGNOSIS — R0602 Shortness of breath: Secondary | ICD-10-CM | POA: Diagnosis not present

## 2020-10-02 DIAGNOSIS — Z6841 Body Mass Index (BMI) 40.0 and over, adult: Secondary | ICD-10-CM | POA: Diagnosis not present

## 2020-10-02 DIAGNOSIS — I272 Pulmonary hypertension, unspecified: Secondary | ICD-10-CM

## 2020-10-02 DIAGNOSIS — Z87891 Personal history of nicotine dependence: Secondary | ICD-10-CM

## 2020-10-02 HISTORY — DX: Heart failure, unspecified: I50.9

## 2020-10-02 LAB — CBC WITH DIFFERENTIAL/PLATELET
Abs Immature Granulocytes: 0.03 10*3/uL (ref 0.00–0.07)
Basophils Absolute: 0.1 10*3/uL (ref 0.0–0.1)
Basophils Relative: 1 %
Eosinophils Absolute: 0.5 10*3/uL (ref 0.0–0.5)
Eosinophils Relative: 6 %
HCT: 46.7 % — ABNORMAL HIGH (ref 36.0–46.0)
Hemoglobin: 14.8 g/dL (ref 12.0–15.0)
Immature Granulocytes: 0 %
Lymphocytes Relative: 19 %
Lymphs Abs: 1.6 10*3/uL (ref 0.7–4.0)
MCH: 25.3 pg — ABNORMAL LOW (ref 26.0–34.0)
MCHC: 31.7 g/dL (ref 30.0–36.0)
MCV: 79.7 fL — ABNORMAL LOW (ref 80.0–100.0)
Monocytes Absolute: 0.7 10*3/uL (ref 0.1–1.0)
Monocytes Relative: 9 %
Neutro Abs: 5.6 10*3/uL (ref 1.7–7.7)
Neutrophils Relative %: 65 %
Platelets: 260 10*3/uL (ref 150–400)
RBC: 5.86 MIL/uL — ABNORMAL HIGH (ref 3.87–5.11)
RDW: 15.2 % (ref 11.5–15.5)
WBC: 8.5 10*3/uL (ref 4.0–10.5)
nRBC: 0 % (ref 0.0–0.2)

## 2020-10-02 LAB — TROPONIN I (HIGH SENSITIVITY)
Troponin I (High Sensitivity): <2 ng/L (ref <18)
Troponin I (High Sensitivity): 3 ng/L (ref <18)

## 2020-10-02 LAB — BASIC METABOLIC PANEL
Anion gap: 8 (ref 5–15)
BUN: 9 mg/dL (ref 6–20)
CO2: 31 mmol/L (ref 22–32)
Calcium: 8.6 mg/dL — ABNORMAL LOW (ref 8.9–10.3)
Chloride: 98 mmol/L (ref 98–111)
Creatinine, Ser: 0.93 mg/dL (ref 0.44–1.00)
GFR, Estimated: >60 mL/min (ref >60)
Glucose, Bld: 120 mg/dL — ABNORMAL HIGH (ref 70–99)
Potassium: 4.2 mmol/L (ref 3.5–5.1)
Sodium: 137 mmol/L (ref 135–145)

## 2020-10-02 LAB — HEMOGLOBIN A1C
Hgb A1c MFr Bld: 7.2 % — ABNORMAL HIGH (ref 4.8–5.6)
Mean Plasma Glucose: 159.94 mg/dL

## 2020-10-02 LAB — GLUCOSE, CAPILLARY
Glucose-Capillary: 155 mg/dL — ABNORMAL HIGH (ref 70–99)
Glucose-Capillary: 186 mg/dL — ABNORMAL HIGH (ref 70–99)

## 2020-10-02 LAB — BRAIN NATRIURETIC PEPTIDE: B Natriuretic Peptide: 17.9 pg/mL (ref 0.0–100.0)

## 2020-10-02 LAB — PROCALCITONIN: Procalcitonin: <0.1 ng/mL

## 2020-10-02 LAB — SARS CORONAVIRUS 2 (TAT 6-24 HRS): SARS Coronavirus 2: NEGATIVE

## 2020-10-02 LAB — D-DIMER, QUANTITATIVE: D-Dimer, Quant: 0.9 ug/mL-FEU — ABNORMAL HIGH (ref 0.00–0.50)

## 2020-10-02 LAB — POC SARS CORONAVIRUS 2 AG -  ED: SARS Coronavirus 2 Ag: NEGATIVE

## 2020-10-02 MED ORDER — SODIUM CHLORIDE 0.9 % IV SOLN
2.0000 g | INTRAVENOUS | Status: DC
  Administered 2020-10-03: 2 g via INTRAVENOUS
  Filled 2020-10-02: qty 2

## 2020-10-02 MED ORDER — FUROSEMIDE 10 MG/ML IJ SOLN
40.0000 mg | Freq: Once | INTRAMUSCULAR | Status: AC
  Administered 2020-10-02: 40 mg via INTRAVENOUS
  Filled 2020-10-02: qty 4

## 2020-10-02 MED ORDER — INSULIN ASPART 100 UNIT/ML ~~LOC~~ SOLN
6.0000 [IU] | Freq: Three times a day (TID) | SUBCUTANEOUS | Status: DC
  Administered 2020-10-03 – 2020-10-07 (×13): 6 [IU] via SUBCUTANEOUS
  Filled 2020-10-02: qty 0.06

## 2020-10-02 MED ORDER — ACETAMINOPHEN 650 MG RE SUPP: 650.0000 mg | Freq: Four times a day (QID) | RECTAL | Status: DC | PRN

## 2020-10-02 MED ORDER — METHYLPREDNISOLONE SODIUM SUCC 125 MG IJ SOLR
60.0000 mg | Freq: Four times a day (QID) | INTRAMUSCULAR | Status: AC
  Administered 2020-10-02 – 2020-10-03 (×4): 60 mg via INTRAVENOUS
  Filled 2020-10-02 (×4): qty 2

## 2020-10-02 MED ORDER — OXYCODONE HCL 5 MG PO TABS
10.0000 mg | ORAL_TABLET | Freq: Four times a day (QID) | ORAL | Status: DC | PRN
  Administered 2020-10-03 – 2020-10-07 (×11): 10 mg via ORAL
  Filled 2020-10-02 (×11): qty 2

## 2020-10-02 MED ORDER — IBUPROFEN 800 MG PO TABS
800.0000 mg | ORAL_TABLET | Freq: Once | ORAL | Status: AC
  Administered 2020-10-02: 800 mg via ORAL
  Filled 2020-10-02: qty 1

## 2020-10-02 MED ORDER — SODIUM CHLORIDE 0.9 % IV SOLN
1.0000 g | Freq: Once | INTRAVENOUS | Status: AC
  Administered 2020-10-02: 1 g via INTRAVENOUS
  Filled 2020-10-02: qty 10

## 2020-10-02 MED ORDER — PREDNISONE 20 MG PO TABS: 40.0000 mg | ORAL_TABLET | Freq: Every day | ORAL | Status: DC

## 2020-10-02 MED ORDER — ISOSORB DINITRATE-HYDRALAZINE 20-37.5 MG PO TABS
1.0000 | ORAL_TABLET | Freq: Two times a day (BID) | ORAL | Status: DC
  Administered 2020-10-02 – 2020-10-07 (×10): 1 via ORAL
  Filled 2020-10-02 (×10): qty 1

## 2020-10-02 MED ORDER — SPIRONOLACTONE 25 MG PO TABS
50.0000 mg | ORAL_TABLET | Freq: Every day | ORAL | Status: DC
  Administered 2020-10-03 – 2020-10-07 (×5): 50 mg via ORAL
  Filled 2020-10-02 (×5): qty 2

## 2020-10-02 MED ORDER — GABAPENTIN 300 MG PO CAPS
600.0000 mg | ORAL_CAPSULE | Freq: Three times a day (TID) | ORAL | Status: DC
  Administered 2020-10-02 – 2020-10-07 (×15): 600 mg via ORAL
  Filled 2020-10-02 (×15): qty 2

## 2020-10-02 MED ORDER — QUETIAPINE FUMARATE 300 MG PO TABS
400.0000 mg | ORAL_TABLET | Freq: Every day | ORAL | Status: DC
  Administered 2020-10-02 – 2020-10-06 (×5): 400 mg via ORAL
  Filled 2020-10-02 (×5): qty 1

## 2020-10-02 MED ORDER — IOHEXOL 350 MG/ML SOLN
100.0000 mL | Freq: Once | INTRAVENOUS | Status: AC | PRN
  Administered 2020-10-02: 100 mL via INTRAVENOUS

## 2020-10-02 MED ORDER — ALBUTEROL SULFATE (2.5 MG/3ML) 0.083% IN NEBU: 2.5000 mg | INHALATION_SOLUTION | RESPIRATORY_TRACT | Status: DC | PRN

## 2020-10-02 MED ORDER — MORPHINE SULFATE (PF) 2 MG/ML IV SOLN
2.0000 mg | INTRAVENOUS | Status: DC | PRN
  Administered 2020-10-06: 2 mg via INTRAVENOUS
  Filled 2020-10-02: qty 1

## 2020-10-02 MED ORDER — INSULIN ASPART 100 UNIT/ML ~~LOC~~ SOLN
0.0000 [IU] | Freq: Three times a day (TID) | SUBCUTANEOUS | Status: DC
  Administered 2020-10-03 (×2): 4 [IU] via SUBCUTANEOUS
  Administered 2020-10-03: 11 [IU] via SUBCUTANEOUS
  Administered 2020-10-04 – 2020-10-05 (×4): 3 [IU] via SUBCUTANEOUS
  Administered 2020-10-05: 4 [IU] via SUBCUTANEOUS
  Administered 2020-10-05: 7 [IU] via SUBCUTANEOUS
  Administered 2020-10-06: 3 [IU] via SUBCUTANEOUS
  Administered 2020-10-06 – 2020-10-07 (×2): 4 [IU] via SUBCUTANEOUS
  Filled 2020-10-02: qty 0.2

## 2020-10-02 MED ORDER — MONTELUKAST SODIUM 10 MG PO TABS
10.0000 mg | ORAL_TABLET | Freq: Every day | ORAL | Status: DC
  Administered 2020-10-02 – 2020-10-06 (×5): 10 mg via ORAL
  Filled 2020-10-02 (×5): qty 1

## 2020-10-02 MED ORDER — POLYETHYLENE GLYCOL 3350 17 G PO PACK
17.0000 g | PACK | Freq: Every day | ORAL | Status: DC | PRN
  Administered 2020-10-04: 17 g via ORAL
  Filled 2020-10-02: qty 1

## 2020-10-02 MED ORDER — SODIUM CHLORIDE 0.9 % IV SOLN
500.0000 mg | INTRAVENOUS | Status: DC
  Administered 2020-10-02: 500 mg via INTRAVENOUS
  Filled 2020-10-02: qty 500

## 2020-10-02 MED ORDER — ACETAMINOPHEN 325 MG PO TABS: 650.0000 mg | ORAL_TABLET | Freq: Four times a day (QID) | ORAL | Status: DC | PRN

## 2020-10-02 MED ORDER — LABETALOL HCL 5 MG/ML IV SOLN
20.0000 mg | Freq: Once | INTRAVENOUS | Status: AC
  Administered 2020-10-02: 20 mg via INTRAVENOUS
  Filled 2020-10-02: qty 4

## 2020-10-02 MED ORDER — ENOXAPARIN SODIUM 100 MG/ML ~~LOC~~ SOLN
100.0000 mg | SUBCUTANEOUS | Status: DC
  Administered 2020-10-02 – 2020-10-06 (×5): 100 mg via SUBCUTANEOUS
  Filled 2020-10-02 (×6): qty 1

## 2020-10-02 MED ORDER — IPRATROPIUM-ALBUTEROL 0.5-2.5 (3) MG/3ML IN SOLN
3.0000 mL | Freq: Four times a day (QID) | RESPIRATORY_TRACT | Status: DC
  Administered 2020-10-02 – 2020-10-03 (×5): 3 mL via RESPIRATORY_TRACT
  Filled 2020-10-02 (×4): qty 3

## 2020-10-02 MED ORDER — MOMETASONE FURO-FORMOTEROL FUM 200-5 MCG/ACT IN AERO
2.0000 | INHALATION_SPRAY | Freq: Two times a day (BID) | RESPIRATORY_TRACT | Status: DC
  Administered 2020-10-02 – 2020-10-07 (×10): 2 via RESPIRATORY_TRACT
  Filled 2020-10-02: qty 8.8

## 2020-10-02 MED ORDER — HYDRALAZINE HCL 20 MG/ML IJ SOLN: 10.0000 mg | Freq: Three times a day (TID) | INTRAMUSCULAR | Status: DC | PRN

## 2020-10-02 MED ORDER — DOXYCYCLINE HYCLATE 100 MG PO TABS
100.0000 mg | ORAL_TABLET | Freq: Once | ORAL | Status: AC
  Administered 2020-10-02: 100 mg via ORAL
  Filled 2020-10-02: qty 1

## 2020-10-02 MED ORDER — PANTOPRAZOLE SODIUM 40 MG PO TBEC
40.0000 mg | DELAYED_RELEASE_TABLET | Freq: Every day | ORAL | Status: DC
  Administered 2020-10-02 – 2020-10-07 (×6): 40 mg via ORAL
  Filled 2020-10-02 (×6): qty 1

## 2020-10-02 MED ORDER — INSULIN ASPART 100 UNIT/ML ~~LOC~~ SOLN
0.0000 [IU] | Freq: Every day | SUBCUTANEOUS | Status: DC
  Administered 2020-10-03: 3 [IU] via SUBCUTANEOUS
  Filled 2020-10-02: qty 0.05

## 2020-10-02 MED ORDER — INSULIN GLARGINE 100 UNIT/ML ~~LOC~~ SOLN
20.0000 [IU] | Freq: Every day | SUBCUTANEOUS | Status: DC
  Administered 2020-10-02 – 2020-10-06 (×5): 20 [IU] via SUBCUTANEOUS
  Filled 2020-10-02 (×5): qty 0.2

## 2020-10-02 NOTE — ED Triage Notes (Signed)
Patient c/o nasal congestion, SOB, asthma,  X 5 days.

## 2020-10-02 NOTE — ED Notes (Signed)
Brought pt water

## 2020-10-02 NOTE — H&P (Addendum)
History and Physical        Hospital Admission Note Date: 10/02/2020  Patient name: Cynthia Becker Medical record number: [id number] Date of birth: 1971-04-25 Age: 50 y.o. Gender: female  PCP: [name], NP    Chief Complaint    Chief Complaint  Patient presents with  . Shortness of Breath  . Nasal Congestion      HPI:   This is a 50 year old female with past medical history of hypertension, hyperlipidemia, diabetes, morbid obesity, OSA, pulmonary hypertension, diastolic CHF, asthma, pneumonia in November 2021 who presents to the hospital with persistent shortness of breath which has worsened over the past 5 days.  Symptoms worsened by activity and feels like she is going to pass out when ambulating.  Has not had improvement with her inhalers.  Feels like she has been gaining fluid in her legs.  Typically uses O2 nightly but has been requiring throughout the day.  Says that she has gained 20 pounds in 2 months.  Denies any fevers, chills or night sweats.   ED Course: Afebrile, tachypneic,  hypertensive, hypoxic (SpO2 89% on room air) placed on 2 L/min. Notable Labs: BNP 18, D-dimer 0.9, Covid antigen negative and PCR pending. Notable Imaging: CTA chest-pneumonia in the posterior segment of left lower lobe with scattered atelectasis and mosaic attenuation which may represent underlying small airway obstructive disease, mild adenopathy, enlargement of main pulmonary outflow tract indicative above PAH, no other acute abnormalities. Patient received doxycycline, ibuprofen, labetalol, ceftriaxone.    Vitals:   10/02/20 1810 10/02/20 1819  BP: (!) 150/98   Pulse: 88   Resp: 20   Temp: 98.2 F (36.8 C)   SpO2: 96% 93%     Review of Systems:  Review of Systems  All other systems reviewed and are negative.   Medical/Social/Family History   Past Medical History: Past  Medical History:  Diagnosis Date  . Anemia    iv iron treatment dec 2014/ see oncology for this last in dec 2014  . Anxiety   . Arthritis    knee, hands  . Asthma    seasonal related/ albuterol used when uri  . CHF (congestive heart failure) ([place])   . CN (constipation) 09/14/2014  . [place] DISEASE, LUMBOSACRAL SPINE W/RADICULOPATHY 11/01/2009   Qualifier: Diagnosis of  By: Jorene Minors, [place]    . Depression   . Diabetes ([place])   . H/O dizziness    Hx  . Headache(784.0)    otc med prn  . High blood pressure   . HYPERLIPIDEMIA 02/13/2010   Qualifier: Diagnosis of  By: Jorene Minors, Scott    . INSOMNIA 08/24/2008   Qualifier: Diagnosis of  By: Radene Ou MD, [place]    . Morbid obesity ([place]) 05/24/2008   Qualifier: Diagnosis of  By: Radene Ou MD, [place]    . SVD (spontaneous vaginal delivery)    x 3    Past Surgical History:  Procedure Laterality Date  . ABDOMINAL HYSTERECTOMY  03/13/2014   total   . BILATERAL SALPINGECTOMY Bilateral 04/12/2014   Procedure: BILATERAL SALPINGECTOMY;  Surgeon: Woodroe Mode, MD;  Location: [facility];  Service: Gynecology;  Laterality: Bilateral;  . CARPAL TUNNEL RELEASE  08/30/2011  Procedure: CARPAL TUNNEL RELEASE;  Surgeon: Wynonia Sours, MD;  Location: [facility];  Service: Orthopedics;  Laterality: Right;  . CHOLECYSTECTOMY  10/11   lap choli  . GALLBLADDER SURGERY  04/04/2010  . HYSTEROSCOPY N/A 02/08/2014   Procedure: HYSTEROSCOPY;  Surgeon: Woodroe Mode, MD;  Location: [facility];  Service: Gynecology;  Laterality: N/A;  . HYSTEROSCOPY WITH NOVASURE N/A 02/08/2014   Procedure: ATTEMPTED NOVASURE ABLATION;  Surgeon: Woodroe Mode, MD;  Location: [facility];  Service: Gynecology;  Laterality: N/A;  . INTRAUTERINE DEVICE INSERTION     07/19/2013  . SEPTOPLASTY N/A 09/08/2013   Procedure: SEPTOPLASTY;  Surgeon: Ruby Cola, MD;  Location: [facility];  Service: ENT;  Laterality: N/A;  . SPINE SURGERY    . TONSILLECTOMY    .  TONSILLECTOMY AND ADENOIDECTOMY Bilateral 09/08/2013   Procedure: TONSILLECTOMY ;  Surgeon: Ruby Cola, MD;  Location: [facility];  Service: ENT;  Laterality: Bilateral;  . TUBAL LIGATION    . VAGINAL HYSTERECTOMY N/A 04/12/2014   Procedure: LAPAROSCOPIC ASSISTED VAGINAL HYSTERECTOMY;  Surgeon: Woodroe Mode, MD;  Location: [facility];  Service: Gynecology;  Laterality: N/A;  . WISDOM TOOTH EXTRACTION      Medications: Prior to Admission medications   Medication Sig Start Date End Date Taking? Authorizing Provider  albuterol (PROVENTIL) (2.5 MG/3ML) 0.083% nebulizer solution USE 1 VIAL IN NEBULIZER EVERY 6 HOURS AS NEEDED FOR WHEEZING OR SHORTNESS OF BREATH Patient taking differently: Take 2.5 mg by nebulization every 6 (six) hours as needed for wheezing or shortness of breath. 03/24/20  Yes [name], NP  albuterol (VENTOLIN HFA) 108 (90 Base) MCG/ACT inhaler Inhale 2 puffs into the lungs every 6 (six) hours as needed for wheezing or shortness of breath.   Yes [provider]  cyclobenzaprine (FLEXERIL) 5 MG tablet Take 1 tablet (5 mg total) by mouth 3 (three) times daily as needed for muscle spasms. 09/25/20 10/25/20 Yes King, Diona Foley, NP  Fluticasone-Salmeterol (ADVAIR DISKUS) 250-50 MCG/DOSE AEPB Inhale 1 puff into the lungs 2 (two) times daily. 12/02/19  Yes Noemi Chapel P, DO  gabapentin (NEURONTIN) 600 MG tablet TAKE 1 TABLET BY MOUTH THREE TIMES DAILY 07/21/20  Yes [name], NP  glipiZIDE (GLUCOTROL) 5 MG tablet Take 1 tablet (5 mg total) by mouth 2 (two) times daily before a meal. 04/19/20 04/19/21 Yes King, Diona Foley, NP  guaifenesin (ROBITUSSIN) 100 MG/5ML syrup Take 200 mg by mouth at bedtime.   Yes [provider]  ibuprofen (ADVIL) 400 MG tablet TAKE 1 TABLET BY MOUTH EVERY 8 HOURS AS NEEDED FOR  MODERATE  PAIN.  TAKE  ON  FULL  STOMACH 09/01/20  Yes [name], NP  isosorbide-hydrALAZINE (BIDIL) 20-37.5 MG tablet Take 1 tablet by mouth in the morning and at  bedtime. 01/14/20  Yes Patwardhan, Reynold Bowen, MD  linaclotide (LINZESS) 290 MCG CAPS capsule Take 1 capsule (290 mcg total) by mouth daily before breakfast. 01/17/20 01/16/21 Yes King, Diona Foley, NP  montelukast (SINGULAIR) 10 MG tablet Take 1 tablet (10 mg total) by mouth at bedtime. 12/02/19  Yes Noemi Chapel P, DO  omeprazole (PRILOSEC) 20 MG capsule Take 20 mg by mouth at bedtime.  04/01/20  Yes [provider]  ondansetron (ZOFRAN) 4 MG tablet Take 4 mg by mouth every 8 (eight) hours as needed for nausea/vomiting. 09/24/20  Yes [provider]  Oxycodone HCl 10 MG TABS Take 10 mg by mouth every 6 (six) hours  as needed (pain). Taking 3-4 times a day   Yes [provider]  QUEtiapine (SEROQUEL) 400 MG tablet Take 400 mg by mouth at bedtime.   Yes [provider]  spironolactone (ALDACTONE) 50 MG tablet Take 1 tablet (50 mg total) by mouth daily. 01/14/20  Yes Patwardhan, Reynold Bowen, MD  ACCU-CHEK FASTCLIX LANCETS MISC USE TO CHECK BLOOD SUGAR TWICE DAILY 03/23/18   [provider]  ACCU-CHEK GUIDE test strip USE 1 STRIP TWICE DAILY 03/30/18   [provider]  cetirizine (ZYRTEC ALLERGY) 10 MG tablet Take 1 tablet (10 mg total) by mouth daily. Patient not taking: Reported on 10/02/2020 12/02/19   Noemi Chapel P, DO  furosemide (LASIX) 40 MG tablet Take 1 tablet (40 mg total) by mouth daily as needed for fluid or edema (as needed for lower extremty swelling, shortness of breath, or wight gain 3 lbs in 24 hours or 5 lbs in 7 days.). Patient not taking: Reported on 10/02/2020 04/29/20   Arrien, Jimmy Picket, MD  guaiFENesin-dextromethorphan [facility] DM) 100-10 MG/5ML syrup Take 5 mLs by mouth every 6 (six) hours as needed for cough. Patient not taking: Reported on 10/02/2020 04/29/20   Arrien, Jimmy Picket, MD    Allergies:  No Known Allergies  Social History:  reports that she quit smoking about 5 months ago. Her smoking use included cigarettes. She  smoked 0.25 packs per day. She has never used smokeless tobacco. She reports that she does not drink alcohol and does not use drugs.  Family History: Family History  Problem Relation Age of Onset  . Diabetes Mother   . Hypertension Father   . Heart disease Other   . Diabetes Other   . Hypertension Other   . Alcohol abuse Other   . Hypertension Brother   . Asthma Brother   . Hypertension Brother   . Asthma Brother   . COPD Maternal Grandmother   . Lung cancer Maternal Grandmother   . Asthma Paternal Grandmother   . Asthma Paternal Aunt      Objective   Physical Exam: Blood pressure (!) 150/98, pulse 88, temperature 98.2 F (36.8 C), temperature source Oral, resp. rate 20, height 5\' 5"  (1.651 m), weight (!) 226.2 kg, last menstrual period 02/13/2014, SpO2 93 %.  Physical Exam Vitals and nursing note reviewed.  Constitutional:      General: She is not in acute distress.    Appearance: Normal appearance. She is obese.     Comments: Raspy voice  HENT:     Head: Normocephalic and atraumatic.  Eyes:     Conjunctiva/sclera: Conjunctivae normal.  Cardiovascular:     Rate and Rhythm: Normal rate and regular rhythm.  Pulmonary:     Effort: Pulmonary effort is normal.     Breath sounds: Wheezing present.  Abdominal:     General: Abdomen is flat.     Palpations: Abdomen is soft.  Musculoskeletal:        General: No swelling or tenderness.     Comments: Chronic bilateral lower extremity skin changes with edema  Skin:    Coloration: Skin is not jaundiced or pale.  Neurological:     Mental Status: She is alert. Mental status is at baseline.  Psychiatric:        Mood and Affect: Mood normal.        Behavior: Behavior normal.     LABS on Admission: I have personally reviewed all the labs and imaging below    Basic Metabolic Panel:  Recent Labs  Lab 10/02/20 1227  NA 137  K 4.2  CL 98  CO2 31  GLUCOSE 120*  BUN 9  CREATININE 0.93  CALCIUM 8.6*   Liver Function  Tests: No results for input(s): AST, ALT, ALKPHOS, BILITOT, PROT, ALBUMIN in the last 168 hours. No results for input(s): LIPASE, AMYLASE in the last 168 hours. No results for input(s): AMMONIA in the last 168 hours. CBC: Recent Labs  Lab 10/02/20 1524  WBC 8.5  NEUTROABS 5.6  HGB 14.8  HCT 46.7*  MCV 79.7*  PLT 260   Cardiac Enzymes: No results for input(s): CKTOTAL, CKMB, CKMBINDEX, TROPONINI in the last 168 hours. BNP: Invalid input(s): POCBNP CBG: Recent Labs  Lab 10/02/20 1814  GLUCAP 155*    Radiological Exams on Admission:  DG Chest 2 View  Result Date: 10/02/2020 CLINICAL DATA:  Dyspnea. EXAM: CHEST - 2 VIEW COMPARISON:  April 24, 2020. FINDINGS: Stable cardiomediastinal silhouette with mild central pulmonary vascular congestion. No consolidative process is noted. No pneumothorax or pleural effusion is noted. Bony thorax is unremarkable. IMPRESSION: Stable mild central pulmonary vascular congestion. Electronically Signed   By: Marijo Conception M.D.   On: 10/02/2020 12:36   CT Angio Chest PE W/Cm &/Or Wo Cm  Result Date: 10/02/2020 CLINICAL DATA:  Wheezing and cough.  Positive D-dimer study EXAM: CT ANGIOGRAPHY CHEST WITH CONTRAST TECHNIQUE: Multidetector CT imaging of the chest was performed using the standard protocol during bolus administration of intravenous contrast. Multiplanar CT image reconstructions and MIPs were obtained to evaluate the vascular anatomy. CONTRAST:  154mL OMNIPAQUE IOHEXOL 350 MG/ML SOLN COMPARISON:  Chest radiograph October 02, 2020; chest CT April 24, 2020 FINDINGS: Cardiovascular: There is no appreciable pulmonary embolus. There is no thoracic aortic aneurysm or dissection. Visualized great vessels appear unremarkable. Note that the right innominate and left common carotid arteries arise as a common trunk, an anatomic variant. There is no pericardial effusion or pericardial thickening. The main pulmonary outflow tract has a measured diameter of  3.9 cm, enlarged. Mediastinum/Nodes: Visualized thyroid appears unremarkable. Several subcentimeter mediastinal lymph nodes are present, not meeting size criteria for pathologic significance. There is a prominent aortopulmonary window lymph node measuring 1.4 x 1.3 cm, stable. There is a lymph node in the left hilar region measuring 1.3 x 1.0 cm, stable. No new lymph node enlargement is evident. No appreciable esophageal lesion. Lungs/Pleura: There are scattered areas of atelectatic change. Areas of airspace opacity in the posterior segment of the left lower lobe felt to represent pneumonia. Areas of mild mosaic attenuation in the lingula and right upper lobe may represent a degree of small airways obstructive disease. No pleural effusions are evident. Trachea and major bronchial structures appear normal. No evident pneumothorax. Upper Abdomen: Visualized upper abdominal structures appear unremarkable. Musculoskeletal: No blastic or lytic bone lesions. There are foci of degenerative change in the thoracic spine. No blastic or lytic bone lesions are evident. No evident chest wall lesions. Review of the MIP images confirms the above findings. IMPRESSION: 1. No appreciable thoracic adenopathy. No aortic aneurysm or dissection. 2. Enlargement of the main pulmonary outflow tract is felt to be indicative of pulmonary arterial hypertension. 3. Apparent pneumonia in the posterior segment left lower lobe. Scattered areas of atelectasis and scattered areas of mosaic attenuation which may represent underlying small airways obstructive disease. No pleural effusions. 4. Areas of mild adenopathy which may have reactive etiology given the underlying parenchymal lung changes. Electronically Signed   By: Lowella Grip  III M.D.   On: 10/02/2020 14:48      EKG: normal sinus rhythm   A & P   Principal Problem:   Acute respiratory failure with hypoxia (HCC) Active Problems:   Morbid obesity (HCC)   Asthma   Shortness  of breath   OSA (obstructive sleep apnea)   Diabetes mellitus due to underlying condition with diabetic autonomic neuropathy, unspecified whether long term insulin use ([place])   1. Acute hypoxic respiratory failure, likely multifactorial: Pulmonary vascular congestion, pulmonary hypertension and asthma exacerbation possibly from pneumonia and likely OHS a. CT with left lower lobe consolidation and adenopathy which could be reactive. Also evidence of pulmonary arterial hypertension.  Wheezing on exam b. Trial of Lasix 40 mg IV x1 c. Ceftriaxone and azithromycin d. Solu-Medrol and inhalers e. Incentive spirometry and flutter valve  2. Type 2 diabetes with neuropathy a. Lantus 20 units daily with NovoLog 6 units 3 times daily with meals, resistant sliding scale night sliding scale while on steroids b. HbA1c c. Continue gabapentin  3. Hypertension a. Continue spironolactone and BiDil  4. Chronic diastolic heart failure, possible mild heart failure exacerbation a. Lasix as above b. Continue spironolactone and BiDil  5. Hyperlipidemia a. Not on meds at home  6. Morbid Obesity Body mass index is 82.97 kg/m. a. Lifestyle modification     DVT prophylaxis: Lovenox   Code Status: Full Code  Diet: Heart healthy carb modified Family Communication: Admission, patients condition and plan of care including tests being ordered have been discussed with the patient who indicates understanding and agrees with the plan and Code Status.  Disposition Plan: The appropriate patient status for this patient is INPATIENT. Inpatient status is judged to be reasonable and necessary in order to provide the required intensity of service to ensure the patient's safety. The patient's presenting symptoms, physical exam findings, and initial radiographic and laboratory data in the context of their chronic comorbidities is felt to place them at high risk for further clinical deterioration. Furthermore, it is not  anticipated that the patient will be medically stable for discharge from the hospital within 2 midnights of admission. The following factors support the patient status of inpatient.   " The patient's presenting symptoms include shortness of breath. " The worrisome physical exam findings include wheezing. " The initial radiographic and laboratory data are worrisome because of pulmonary vascular congestion, left lower lobe consolidation. " The chronic co-morbidities include obesity, diabetes, hypertension, OSA.   * I certify that at the point of admission it is my clinical judgment that the patient will require inpatient hospital care spanning beyond 2 midnights from the point of admission due to high intensity of service, high risk for further deterioration and high frequency of surveillance required.*     Consultants  . None  Procedures  . None  Time Spent on Admission: 64 minutes    Harold Hedge, DO [facility]  10/02/2020, 6:24 PM

## 2020-10-02 NOTE — ED Provider Notes (Signed)
[facility] DEPT Provider Note   CSN: [id number] Arrival date & time: 10/02/20  1115     History Chief Complaint  Patient presents with  . Shortness of Breath  . Nasal Congestion    Cynthia Becker is a 50 y.o. female.  She was admitted to the hospital between November 1 on November 6 of last year.  She had community-acquired pneumonia.  She was discharged home, and she states that she does not feel like she has gotten completely better ever since.  She does have home oxygen to use at night, but for the past few days she has been using it all the time.  The history is provided by the patient.  Shortness of Breath Severity:  Severe Onset quality:  Gradual Duration:  5 days Timing:  Constant Progression:  Worsening Chronicity:  New Context: URI   Context comment:  Grandson had a cold, and she thinks she caught it from him. Relieved by:  Nothing Worsened by:  Activity (Feels like she is going to pass out when trying to ambulate to the restroom.) Ineffective treatments:  Diuretics, inhaler and rest Associated symptoms: chest pain (heaviness) and cough   Associated symptoms: no abdominal pain, no ear pain, no fever, no rash, no sore throat and no vomiting        Past Medical History:  Diagnosis Date  . Anemia    iv iron treatment dec 2014/ see oncology for this last in dec 2014  . Anxiety   . Arthritis    knee, hands  . Asthma    seasonal related/ albuterol used when uri  . CN (constipation) 09/14/2014  . [place] DISEASE, LUMBOSACRAL SPINE W/RADICULOPATHY 11/01/2009   Qualifier: Diagnosis of  By: Jorene Minors, [place]    . Depression   . Diabetes ([place])   . H/O dizziness    Hx  . Headache(784.0)    otc med prn  . High blood pressure   . HYPERLIPIDEMIA 02/13/2010   Qualifier: Diagnosis of  By: Jorene Minors, Scott    . INSOMNIA 08/24/2008   Qualifier: Diagnosis of  By: Radene Ou MD, [place]    . Morbid obesity ([place]) 05/24/2008    Qualifier: Diagnosis of  By: Radene Ou MD, [place]    . SVD (spontaneous vaginal delivery)    x 3    Patient Active Problem List   Diagnosis Date Noted  . Acute on chronic diastolic CHF (congestive heart failure) ([place]) 04/29/2020  . Essential hypertension 04/25/2020  . Community acquired bacterial pneumonia 04/25/2020  . Class 3 obesity 04/25/2020  . Diabetes mellitus due to underlying condition with diabetic autonomic neuropathy, unspecified whether long term insulin use ([place]) 04/24/2020  . Acute respiratory failure ([place]) 04/24/2020  . Asthmatic bronchitis 03/21/2020  . Nocturnal hypoxia 03/21/2020  . OSA (obstructive sleep apnea) 12/22/2019  . Shortness of breath 08/09/2019  . No-show for appointment 06/30/2019  . Morbid obesity with BMI of 60.0-69.9, adult ([place]) 09/14/2014  . Iron deficiency anemia due to chronic blood loss 09/14/2014  . CN (constipation) 09/14/2014  . Postoperative state 04/12/2014  . Uterine fibroid 03/17/2014  . DUB (dysfunctional uterine bleeding) 01/27/2014  . Iron deficiency anemia due to chronic blood loss 04/09/2013  . 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 Menometrorrhagia 04/09/2013  . Left hand pain 09/18/2011  . High blood pressure   . ANEMIA, IRON DEFICIENCY 08/06/2010  . CHOLECYSTECTOMY, HX OF 04/09/2010  . Dyslipidemia 02/13/2010  . DEGENERATIVE DISC DISEASE, LUMBOSACRAL SPINE W/RADICULOPATHY 11/01/2009  .  BACK PAIN, LUMBAR, WITH RADICULOPATHY 10/30/2009  . TRIGGER FINGER 02/03/2009  . Carpal tunnel syndrome 12/21/2008  . Headache(784.0) 11/09/2008  . Depression 10/04/2008  . URI 08/24/2008  . INSOMNIA 08/24/2008  . Asthma 06/06/2008  . Morbid obesity ([place]) 05/24/2008  . ESSENTIAL HYPERTENSION 05/24/2008    Past Surgical History:  Procedure Laterality Date  . ABDOMINAL HYSTERECTOMY  03/13/2014   total   . BILATERAL SALPINGECTOMY Bilateral 04/12/2014   Procedure: BILATERAL SALPINGECTOMY;  Surgeon: Woodroe Mode, MD;  Location: [facility];  Service: Gynecology;  Laterality: Bilateral;  . CARPAL TUNNEL RELEASE  08/30/2011   Procedure: CARPAL TUNNEL RELEASE;  Surgeon: Wynonia Sours, MD;  Location: [facility];  Service: Orthopedics;  Laterality: Right;  . CHOLECYSTECTOMY  10/11   lap choli  . GALLBLADDER SURGERY  04/04/2010  . HYSTEROSCOPY N/A 02/08/2014   Procedure: HYSTEROSCOPY;  Surgeon: Woodroe Mode, MD;  Location: [facility];  Service: Gynecology;  Laterality: N/A;  . HYSTEROSCOPY WITH NOVASURE N/A 02/08/2014   Procedure: ATTEMPTED NOVASURE ABLATION;  Surgeon: Woodroe Mode, MD;  Location: [facility];  Service: Gynecology;  Laterality: N/A;  . INTRAUTERINE DEVICE INSERTION     07/19/2013  . SEPTOPLASTY N/A 09/08/2013   Procedure: SEPTOPLASTY;  Surgeon: Ruby Cola, MD;  Location: [facility];  Service: ENT;  Laterality: N/A;  . SPINE SURGERY    . TONSILLECTOMY AND ADENOIDECTOMY Bilateral 09/08/2013   Procedure: TONSILLECTOMY ;  Surgeon: Ruby Cola, MD;  Location: [facility];  Service: ENT;  Laterality: Bilateral;  . TUBAL LIGATION    . VAGINAL HYSTERECTOMY N/A 04/12/2014   Procedure: LAPAROSCOPIC ASSISTED VAGINAL HYSTERECTOMY;  Surgeon: Woodroe Mode, MD;  Location: [facility];  Service: Gynecology;  Laterality: N/A;  . WISDOM TOOTH EXTRACTION       OB History    Gravida  3   Para  3   Term  3   Preterm  0   AB  0   Living  3     SAB  0   IAB  0   Ectopic  0   Multiple  0   Live Births              Family History  Problem Relation Age of Onset  . Diabetes Mother   . Hypertension Father   . Heart disease Other   . Diabetes Other   . Hypertension Other   . Alcohol abuse Other   . Hypertension Brother   . Asthma Brother   . Hypertension Brother   . Asthma Brother   . COPD Maternal Grandmother   . Lung cancer Maternal Grandmother   . Asthma Paternal Grandmother   . Asthma Paternal Aunt     Social History   Tobacco Use  . Smoking status: Former Smoker    Packs/day: 0.25    Types:  Cigarettes    Quit date: 04/24/2020    Years since quitting: 0.4  . Smokeless tobacco: Never Used  . Tobacco comment: 4-5 cigarettes a day  Vaping Use  . Vaping Use: Never used  Substance Use Topics  . Alcohol use: No  . Drug use: No    Home Medications Prior to Admission medications   Medication Sig Start Date End Date Taking? Authorizing Provider  ACCU-CHEK FASTCLIX LANCETS MISC USE TO CHECK BLOOD SUGAR TWICE DAILY 03/23/18   [provider]  ACCU-CHEK GUIDE test strip USE 1 [place] 03/30/18   [provider]  albuterol (PROVENTIL) (2.5  MG/3ML) 0.083% nebulizer solution USE 1 VIAL IN NEBULIZER EVERY 6 HOURS AS NEEDED FOR WHEEZING OR SHORTNESS OF BREATH Patient taking differently: Take 2.5 mg by nebulization every 6 (six) hours as needed for wheezing or shortness of breath.  03/24/20   [name], NP  albuterol (VENTOLIN HFA) 108 (90 Base) MCG/ACT inhaler Inhale 2 puffs into the lungs every 6 (six) hours as needed for wheezing or shortness of breath.    [provider]  cetirizine (ZYRTEC ALLERGY) 10 MG tablet Take 1 tablet (10 mg total) by mouth daily. 12/02/19   Julian Hy, DO  cyclobenzaprine (FLEXERIL) 5 MG tablet Take 1 tablet (5 mg total) by mouth 3 (three) times daily as needed for muscle spasms. 09/25/20 10/25/20  [name], NP  Fluticasone-Salmeterol (ADVAIR DISKUS) 250-50 MCG/DOSE AEPB Inhale 1 puff into the lungs 2 (two) times daily. 12/02/19   Julian Hy, DO  furosemide (LASIX) 40 MG tablet Take 1 tablet (40 mg total) by mouth daily as needed for fluid or edema (as needed for lower extremty swelling, shortness of breath, or wight gain 3 lbs in 24 hours or 5 lbs in 7 days.). 04/29/20   Arrien, Jimmy Picket, MD  gabapentin (NEURONTIN) 600 MG tablet TAKE 1 TABLET BY MOUTH THREE TIMES DAILY 07/21/20   [name], NP  glipiZIDE (GLUCOTROL) 5 MG tablet Take 1 tablet (5 mg total) by mouth 2 (two) times daily before a meal. 04/19/20  04/19/21  [name], NP  guaiFENesin-dextromethorphan (ROBITUSSIN DM) 100-10 MG/5ML syrup Take 5 mLs by mouth every 6 (six) hours as needed for cough. 04/29/20   Arrien, Jimmy Picket, MD  ibuprofen (ADVIL) 400 MG tablet TAKE 1 TABLET BY MOUTH EVERY 8 HOURS AS NEEDED FOR  MODERATE  PAIN.  TAKE  ON  FULL  STOMACH 09/01/20   [name], NP  isosorbide-hydrALAZINE (BIDIL) 20-37.5 MG tablet Take 1 tablet by mouth in the morning and at bedtime. 01/14/20   Patwardhan, Reynold Bowen, MD  linaclotide Rolan Lipa) 290 MCG CAPS capsule Take 1 capsule (290 mcg total) by mouth daily before breakfast. 01/17/20 01/16/21  [name], NP  montelukast (SINGULAIR) 10 MG tablet Take 1 tablet (10 mg total) by mouth at bedtime. 12/02/19   Julian Hy, DO  omeprazole (PRILOSEC) 20 MG capsule Take 20 mg by mouth at bedtime.  04/01/20   [provider]  Oxycodone HCl 10 MG TABS Take 10 mg by mouth every 6 (six) hours as needed (pain). Taking 3-4 times a day Patient not taking: Reported on 05/29/2020    [provider]  QUEtiapine (SEROQUEL) 400 MG tablet Take 400 mg by mouth at bedtime.    [provider]  spironolactone (ALDACTONE) 50 MG tablet Take 1 tablet (50 mg total) by mouth daily. 01/14/20   Patwardhan, Reynold Bowen, MD    Allergies    Patient has no known allergies.  Review of Systems   Review of Systems  Constitutional: Negative for chills and fever.  HENT: Negative for ear pain and sore throat.   Eyes: Negative for pain and visual disturbance.  Respiratory: Positive for cough and shortness of breath.   Cardiovascular: Positive for chest pain (heaviness). Negative for palpitations.  Gastrointestinal: Negative for abdominal pain and vomiting.  Genitourinary: Negative for dysuria and hematuria.  Musculoskeletal: Negative for arthralgias and back pain.  Skin: Negative for color change and rash.  Neurological: Negative for seizures and syncope.  All other systems reviewed and  are  negative.   Physical Exam Updated Vital Signs BP (!) 154/108 (BP Location: Right Arm)   Pulse 98   Temp 99 F (37.2 C) (Oral)   Resp 20   Ht 5\' 5"  (1.651 m)   Wt (!) 217.7 kg   LMP 02/13/2014   SpO2 (!) 89%   BMI 79.88 kg/m   Physical Exam Vitals and nursing note reviewed.  Constitutional:      General: She is not in acute distress.    Appearance: She is well-developed. She is ill-appearing.  HENT:     Head: Normocephalic and atraumatic.  Eyes:     Conjunctiva/sclera: Conjunctivae normal.  Cardiovascular:     Rate and Rhythm: Regular rhythm. Tachycardia present.     Heart sounds: No murmur heard.   Pulmonary:     Effort: Respiratory distress present.     Breath sounds: Examination of the right-upper field reveals wheezing. Examination of the left-upper field reveals wheezing. Examination of the right-middle field reveals wheezing. Examination of the left-middle field reveals wheezing. Examination of the right-lower field reveals decreased breath sounds. Examination of the left-lower field reveals decreased breath sounds. Decreased breath sounds and wheezing present.  Abdominal:     Palpations: Abdomen is soft.     Tenderness: There is no abdominal tenderness.  Musculoskeletal:     Cervical back: Neck supple.     Right lower leg: Edema present.     Left lower leg: Edema present.     Comments: Pitting edema to the knees bilaterally  Skin:    General: Skin is warm and dry.  Neurological:     General: No focal deficit present.     Mental Status: She is alert.  Psychiatric:        Mood and Affect: Mood normal.     ED Results / Procedures / Treatments   Labs (all labs ordered are listed, but only abnormal results are displayed) Labs Reviewed  BASIC METABOLIC PANEL - Abnormal; Notable for the following components:      Result Value   Glucose, Bld 120 (*)    Calcium 8.6 (*)    All other components within normal limits  D-DIMER, QUANTITATIVE - Abnormal; Notable for  the following components:   D-Dimer, Quant 0.90 (*)    All other components within normal limits  SARS CORONAVIRUS 2 (TAT 6-24 HRS)  BRAIN NATRIURETIC PEPTIDE  CBC WITH DIFFERENTIAL/PLATELET  POC SARS CORONAVIRUS 2 AG -  ED  TROPONIN I (HIGH SENSITIVITY)  TROPONIN I (HIGH SENSITIVITY)    EKG EKG Interpretation  Date/Time:  Monday October 02 2020 11:28:44 EDT Ventricular Rate:  100 PR Interval:  205 QRS Duration: 85 QT Interval:  338 QTC Calculation: 436 R Axis:   69 Text Interpretation: Sinus tachycardia Prolonged PR interval Low voltage, precordial leads 12 Lead; Mason-Likar normal axis similar to prior EKG Confirmed by Lorre Munroe ([id number]) on 10/02/2020 12:13:35 PM   Radiology DG Chest 2 View  Result Date: 10/02/2020 CLINICAL DATA:  Dyspnea. EXAM: CHEST - 2 VIEW COMPARISON:  April 24, 2020. FINDINGS: Stable cardiomediastinal silhouette with mild central pulmonary vascular congestion. No consolidative process is noted. No pneumothorax or pleural effusion is noted. Bony thorax is unremarkable. IMPRESSION: Stable mild central pulmonary vascular congestion. Electronically Signed   By: Marijo Conception M.D.   On: 10/02/2020 12:36   CT Angio Chest PE W/Cm &/Or Wo Cm  Result Date: 10/02/2020 CLINICAL DATA:  Wheezing and cough.  Positive D-dimer study EXAM: CT ANGIOGRAPHY CHEST  WITH CONTRAST TECHNIQUE: Multidetector CT imaging of the chest was performed using the standard protocol during bolus administration of intravenous contrast. Multiplanar CT image reconstructions and MIPs were obtained to evaluate the vascular anatomy. CONTRAST:  173mL OMNIPAQUE IOHEXOL 350 MG/ML SOLN COMPARISON:  Chest radiograph October 02, 2020; chest CT April 24, 2020 FINDINGS: Cardiovascular: There is no appreciable pulmonary embolus. There is no thoracic aortic aneurysm or dissection. Visualized great vessels appear unremarkable. Note that the right innominate and left common carotid arteries arise as a common trunk,  an anatomic variant. There is no pericardial effusion or pericardial thickening. The main pulmonary outflow tract has a measured diameter of 3.9 cm, enlarged. Mediastinum/Nodes: Visualized thyroid appears unremarkable. Several subcentimeter mediastinal lymph nodes are present, not meeting size criteria for pathologic significance. There is a prominent aortopulmonary window lymph node measuring 1.4 x 1.3 cm, stable. There is a lymph node in the left hilar region measuring 1.3 x 1.0 cm, stable. No new lymph node enlargement is evident. No appreciable esophageal lesion. Lungs/Pleura: There are scattered areas of atelectatic change. Areas of airspace opacity in the posterior segment of the left lower lobe felt to represent pneumonia. Areas of mild mosaic attenuation in the lingula and right upper lobe may represent a degree of small airways obstructive disease. No pleural effusions are evident. Trachea and major bronchial structures appear normal. No evident pneumothorax. Upper Abdomen: Visualized upper abdominal structures appear unremarkable. Musculoskeletal: No blastic or lytic bone lesions. There are foci of degenerative change in the thoracic spine. No blastic or lytic bone lesions are evident. No evident chest wall lesions. Review of the MIP images confirms the above findings. IMPRESSION: 1. No appreciable thoracic adenopathy. No aortic aneurysm or dissection. 2. Enlargement of the main pulmonary outflow tract is felt to be indicative of pulmonary arterial hypertension. 3. Apparent pneumonia in the posterior segment left lower lobe. Scattered areas of atelectasis and scattered areas of mosaic attenuation which may represent underlying small airways obstructive disease. No pleural effusions. 4. Areas of mild adenopathy which may have reactive etiology given the underlying parenchymal lung changes. Electronically Signed   By: Lowella Grip III M.D.   On: 10/02/2020 14:48    Procedures Procedures    Medications Ordered in ED Medications  cefTRIAXone (ROCEPHIN) 1 g in sodium chloride 0.9 % 100 mL IVPB (1 g Intravenous New Bag/Given 10/02/20 1514)  iohexol (OMNIPAQUE) 350 MG/ML injection 100 mL (100 mLs Intravenous Contrast Given 10/02/20 1411)  ibuprofen (ADVIL) tablet 800 mg (800 mg Oral Given 10/02/20 1439)  labetalol (NORMODYNE) injection 20 mg (20 mg Intravenous Given 10/02/20 1456)  doxycycline (VIBRA-TABS) tablet 100 mg (100 mg Oral Given 10/02/20 1513)    ED Course  I have reviewed the triage vital signs and the nursing notes.  Pertinent labs & imaging results that were available during my care of the patient were reviewed by me and considered in my medical decision making (see chart for details).  Clinical Course as of 10/02/20 1525  Mon Oct 02, 2020  1523 I spoke to Dr. Cecille Amsterdam who will evaluate her for admission.  [AW]    Clinical Course User Index [AW] Arnaldo Natal, MD   MDM Rules/Calculators/A&P                          Cleopatra Sardo is 50 years old.  She has a history of obesity and pulmonary hypertension.  She presents after exposure to a sick grandson, and she is  complaining of a respiratory illness.  She was noted to be hypoxic on room air, and she was placed on 2 L of oxygen.  She was evaluated for evidence of infection, PE, ACS, and CHF.  It does appear that she has pneumonia, and secondary to her history of severe pulmonary hypertension, increased O2 requirement, she will be admitted for further evaluation and treatment. Final Clinical Impression(s) / ED Diagnoses Final diagnoses:  Community acquired pneumonia of left lower lobe of lung  Pulmonary hypertension ([place])  Primary hypertension  Acute on chronic respiratory failure with hypoxia [facility])    Rx / DC Orders ED Discharge Orders    None       Arnaldo Natal, MD 10/02/20 1529

## 2020-10-02 NOTE — ED Notes (Signed)
CNA brought pt bedside commode

## 2020-10-02 NOTE — Progress Notes (Signed)
Pt refused cpap

## 2020-10-02 NOTE — ED Notes (Signed)
Attempted to give report x2 w/o response from RN

## 2020-10-03 ENCOUNTER — Inpatient Hospital Stay (HOSPITAL_COMMUNITY): Payer: Medicaid Other

## 2020-10-03 DIAGNOSIS — J9601 Acute respiratory failure with hypoxia: Secondary | ICD-10-CM | POA: Diagnosis not present

## 2020-10-03 DIAGNOSIS — I5033 Acute on chronic diastolic (congestive) heart failure: Secondary | ICD-10-CM | POA: Diagnosis not present

## 2020-10-03 LAB — BASIC METABOLIC PANEL
Anion gap: 12 (ref 5–15)
BUN: 11 mg/dL (ref 6–20)
CO2: 30 mmol/L (ref 22–32)
Calcium: 9.2 mg/dL (ref 8.9–10.3)
Chloride: 96 mmol/L — ABNORMAL LOW (ref 98–111)
Creatinine, Ser: 0.78 mg/dL (ref 0.44–1.00)
GFR, Estimated: >60 mL/min (ref >60)
Glucose, Bld: 176 mg/dL — ABNORMAL HIGH (ref 70–99)
Potassium: 4.9 mmol/L (ref 3.5–5.1)
Sodium: 138 mmol/L (ref 135–145)

## 2020-10-03 LAB — GLUCOSE, CAPILLARY
Glucose-Capillary: 177 mg/dL — ABNORMAL HIGH (ref 70–99)
Glucose-Capillary: 277 mg/dL — ABNORMAL HIGH (ref 70–99)
Glucose-Capillary: 167 mg/dL — ABNORMAL HIGH (ref 70–99)
Glucose-Capillary: 258 mg/dL — ABNORMAL HIGH (ref 70–99)

## 2020-10-03 LAB — ECHOCARDIOGRAM COMPLETE
Area-P 1/2: 3.03 cm2
Height: 65 in
S' Lateral: 3.2 cm
Weight: 7977.6 oz

## 2020-10-03 LAB — CBC
HCT: 47.8 % — ABNORMAL HIGH (ref 36.0–46.0)
Hemoglobin: 15.2 g/dL — ABNORMAL HIGH (ref 12.0–15.0)
MCH: 25.1 pg — ABNORMAL LOW (ref 26.0–34.0)
MCHC: 31.8 g/dL (ref 30.0–36.0)
MCV: 79 fL — ABNORMAL LOW (ref 80.0–100.0)
Platelets: 292 10*3/uL (ref 150–400)
RBC: 6.05 MIL/uL — ABNORMAL HIGH (ref 3.87–5.11)
RDW: 15.4 % (ref 11.5–15.5)
WBC: 8.3 10*3/uL (ref 4.0–10.5)
nRBC: 0 % (ref 0.0–0.2)

## 2020-10-03 MED ORDER — FUROSEMIDE 10 MG/ML IJ SOLN
40.0000 mg | Freq: Two times a day (BID) | INTRAMUSCULAR | Status: DC
  Administered 2020-10-03 – 2020-10-06 (×7): 40 mg via INTRAVENOUS
  Filled 2020-10-03 (×6): qty 4

## 2020-10-03 MED ORDER — PERFLUTREN LIPID MICROSPHERE
1.0000 mL | INTRAVENOUS | Status: AC | PRN
  Administered 2020-10-03: 2 mL via INTRAVENOUS
  Filled 2020-10-03: qty 10

## 2020-10-03 MED ORDER — FUROSEMIDE 10 MG/ML IJ SOLN
40.0000 mg | Freq: Once | INTRAMUSCULAR | Status: DC
  Filled 2020-10-03: qty 4

## 2020-10-03 MED ORDER — ONDANSETRON HCL 4 MG/2ML IJ SOLN
4.0000 mg | Freq: Four times a day (QID) | INTRAMUSCULAR | Status: DC | PRN
  Administered 2020-10-03: 4 mg via INTRAVENOUS
  Filled 2020-10-03: qty 2

## 2020-10-03 NOTE — Progress Notes (Signed)
Continues to decline nocturnal CPAP

## 2020-10-03 NOTE — Progress Notes (Signed)
PROGRESS NOTE  Cynthia Becker  DOB: Sep 19, 1970  PCP: [name], NP JAS:[id number]  DOA: 10/02/2020  LOS: 1 day   Chief Complaint  Patient presents with  . Shortness of Breath  . Nasal Congestion    Brief narrative: Cynthia Becker is a 50 y.o. female with PMH significant for morbid obesity, DM2, HTN, HLD, diastolic CHF, pulmonary hypertension, no OSA per patient with limited mobility recently got a wheelchair delivered to her. Patient presented to the ED on 4/11 with complaint of progressively worsening shortness of breath of the last 5 days, refractory to inhalers, 20 pound weight gain in 2 months. In the ED, patient was afebrile, blood pressure elevated up to 171/112 Labs with BNP not elevated, troponin less than 2, D-dimer slightly elevated, Covid PCR negative. CT angio of chest with possible pneumonia in the left lower lobe, areas of mild adenopathy Patient was admitted to hospitalist service for further evaluation management. See below for details  Subjective: Patient was seen and examined this morning. Pleasant middle-aged morbidly obese AA female. Propped up in bed.  Husband at bedside.  Assessment/Plan: Acute hypoxic respiratory failure -Likely secondary to CHF exacerbation. -Currently on 2 L oxygen by nasal cannula.  At home, she was on oxygen only at night. -Wean down as tolerated.  Acute exacerbation of diastolic CHF Essential hypertension -Presented with progressive shortness of breath, 20 pound weight gain in 17-month.   -Home meds include BiDil 20/37.5 mg twice daily, Aldactone 50 mg daily, Lasix 40 mg daily as needed. -Echocardiogram from November 2021 showed EF of 60 to 65% with moderate concentric LVH, grade 2 diastolic dysfunction. -I would start diuresis with Lasix 40 mg IV twice daily at this time. -Continue BiDil and Aldactone. -Net IO Since Admission: 150 mL [10/03/20 1128] -Continue to monitor for daily intake output, weight, blood pressure, BNP,  renal function and electrolytes. Recent Labs  Lab 10/02/20 1227 10/03/20 0526  BNP 17.9  --   BUN 9 11  CREATININE 0.93 0.78  K 4.2 4.9   Abnormal CT chest -CT scan of chest suggested left lower lobe pneumonia.  However with no fever, normal WBC count, normal procalcitonin.  I do not think patient has an infectious process going on.  I will stop antibiotics at this time. Recent Labs  Lab 10/02/20 1443 10/02/20 1524 10/03/20 0526  WBC  --  8.5 8.3  PROCALCITON <0.10  --   --    History of asthma, pulmonary hypertension No history of OSA per patient -Continue albuterol, Advair Diskus, Robitussin, Singulair 10 mg daily -I do not think patient needs IV or oral steroids at this time. -Patient states he had sleep study done 3 times in the last few years and all of them were negative.  Type 2 diabetes with neuropathy -A1c 7.2 on 10/02/2020.   -On glipizide 5 mg twice daily at home.  -Currently on sliding scale insulin with Accu-Cheks.  -Continue Neurontin. Recent Labs  Lab 10/02/20 1814 10/02/20 2238 10/03/20 0733  GLUCAP 155* 186* 177*   Hyperlipidemia Not on meds at home  Morbid obesity  -Body mass index is 82.97 kg/m. Patient has been advised to make an attempt to improve diet and exercise patterns to aid in weight loss.  Anxiety -Continue Seroquel 400 mg at bedtime  Chronic back pain -Continue Flexeril 5 mg 3 times daily as needed, ibuprofen as needed, oxycodone 10 mg every 6 hours as needed,  Constipation -Linzess 290 mcg daily,  GERD -Prilosec 20 mg at bedtime  Impaired mobility -Patient discharged she has difficulty with ambulation because of her weight, back pain.  She just got wheelchair delivered at her house. -Obtain PT evaluation  Mobility: PT order Code Status:   Code Status: Full Code  Nutritional status: Body mass index is 82.97 kg/m.     Diet Order            Diet heart healthy/carb modified Room service appropriate? Yes; Fluid  consistency: Thin  Diet effective now                 DVT prophylaxis: Lovenox subcu   Antimicrobials:  Stop antibiotics Fluid: None Consultants: None Family Communication:  Husband at bedside  Status is: Inpatient  Remains inpatient appropriate because -patient needs IV diuresis  Dispo: The patient is from: Home              Anticipated d/c is to: Home              Patient currently is not medically stable to d/c.   Difficult to place patient No       Infusions:    Scheduled Meds: . enoxaparin (LOVENOX) injection  100 mg Subcutaneous Q24H  . furosemide  40 mg Intravenous BID  . gabapentin  600 mg Oral TID  . insulin aspart  0-20 Units Subcutaneous TID WC  . insulin aspart  0-5 Units Subcutaneous QHS  . insulin aspart  6 Units Subcutaneous TID WC  . insulin glargine  20 Units Subcutaneous QHS  . ipratropium-albuterol  3 mL Nebulization Q6H  . isosorbide-hydrALAZINE  1 tablet Oral BID  . mometasone-formoterol  2 puff Inhalation BID  . montelukast  10 mg Oral QHS  . pantoprazole  40 mg Oral Daily  . QUEtiapine  400 mg Oral QHS  . spironolactone  50 mg Oral Daily    Antimicrobials: Anti-infectives (From admission, onward)   Start     Dose/Rate Route Frequency Ordered Stop   10/03/20 1000  cefTRIAXone (ROCEPHIN) 2 g in sodium chloride 0.9 % 100 mL IVPB  Status:  Discontinued        2 g 200 mL/hr over 30 Minutes Intravenous Every 24 hours 10/02/20 1557 10/03/20 1119   10/02/20 2200  azithromycin (ZITHROMAX) 500 mg in sodium chloride 0.9 % 250 mL IVPB  Status:  Discontinued        500 mg 250 mL/hr over 60 Minutes Intravenous Every 24 hours 10/02/20 1557 10/03/20 1126   10/02/20 1515  cefTRIAXone (ROCEPHIN) 1 g in sodium chloride 0.9 % 100 mL IVPB        1 g 200 mL/hr over 30 Minutes Intravenous  Once 10/02/20 1505 10/02/20 1604   10/02/20 1515  doxycycline (VIBRA-TABS) tablet 100 mg        100 mg Oral  Once 10/02/20 1505 10/02/20 1513      PRN  meds: acetaminophen **OR** acetaminophen, albuterol, hydrALAZINE, morphine injection, oxyCODONE, polyethylene glycol   Objective: Vitals:   10/03/20 0614 10/03/20 0809  BP: (!) 164/102   Pulse: 95   Resp: 18   Temp: 97.9 F (36.6 C)   SpO2: 94% 93%    Intake/Output Summary (Last 24 hours) at 10/03/2020 1128 Last data filed at 10/03/2020 0400 Gross per 24 hour  Intake 450 ml  Output 300 ml  Net 150 ml   Filed Weights   10/02/20 1127 10/02/20 1359  Weight: (!) 217.7 kg (!) 226.2 kg   Weight change:  Body mass index is 82.97 kg/m.  Physical Exam: General exam: Morbidly obese, African-American female Skin: No rashes, lesions or ulcers. HEENT: Atraumatic, normocephalic, no obvious bleeding Lungs: Clear to auscultation bilaterally CVS: Regular rate and rhythm, no murmur GI/Abd soft, distended from obesity, nontender CNS: Alert, awake, oriented x3 Psychiatry: Mood appropriate Extremities: Chronic bilateral lymphedema with stasis changes, skin thickening.  Data Review: I have personally reviewed the laboratory data and studies available.  Recent Labs  Lab 10/02/20 1524 10/03/20 0526  WBC 8.5 8.3  NEUTROABS 5.6  --   HGB 14.8 15.2*  HCT 46.7* 47.8*  MCV 79.7* 79.0*  PLT 260 292   Recent Labs  Lab 10/02/20 1227 10/03/20 0526  NA 137 138  K 4.2 4.9  CL 98 96*  CO2 31 30  GLUCOSE 120* 176*  BUN 9 11  CREATININE 0.93 0.78  CALCIUM 8.6* 9.2    F/u labs ordered Unresulted Labs (From admission, onward)          Start     Ordered   10/03/20 6295  Basic metabolic panel  Daily,   R      10/02/20 1557   10/03/20 0500  CBC  Daily,   R      10/02/20 1557          Signed, Terrilee Croak, MD [facility] 10/03/2020

## 2020-10-03 NOTE — Progress Notes (Signed)
  Echocardiogram 2D Echocardiogram has been performed.  Cynthia Becker Cynthia Becker 10/03/2020, 2:05 PM

## 2020-10-04 DIAGNOSIS — J9601 Acute respiratory failure with hypoxia: Secondary | ICD-10-CM | POA: Diagnosis not present

## 2020-10-04 LAB — GLUCOSE, CAPILLARY
Glucose-Capillary: 149 mg/dL — ABNORMAL HIGH (ref 70–99)
Glucose-Capillary: 148 mg/dL — ABNORMAL HIGH (ref 70–99)
Glucose-Capillary: 124 mg/dL — ABNORMAL HIGH (ref 70–99)

## 2020-10-04 LAB — BASIC METABOLIC PANEL
Anion gap: 10 (ref 5–15)
BUN: 18 mg/dL (ref 6–20)
CO2: 32 mmol/L (ref 22–32)
Calcium: 9 mg/dL (ref 8.9–10.3)
Chloride: 95 mmol/L — ABNORMAL LOW (ref 98–111)
Creatinine, Ser: 0.83 mg/dL (ref 0.44–1.00)
GFR, Estimated: >60 mL/min (ref >60)
Glucose, Bld: 155 mg/dL — ABNORMAL HIGH (ref 70–99)
Potassium: 4.3 mmol/L (ref 3.5–5.1)
Sodium: 137 mmol/L (ref 135–145)

## 2020-10-04 LAB — CBC
HCT: 45.8 % (ref 36.0–46.0)
Hemoglobin: 14.9 g/dL (ref 12.0–15.0)
MCH: 25.4 pg — ABNORMAL LOW (ref 26.0–34.0)
MCHC: 32.5 g/dL (ref 30.0–36.0)
MCV: 78 fL — ABNORMAL LOW (ref 80.0–100.0)
Platelets: 285 10*3/uL (ref 150–400)
RBC: 5.87 MIL/uL — ABNORMAL HIGH (ref 3.87–5.11)
RDW: 14.9 % (ref 11.5–15.5)
WBC: 10.3 10*3/uL (ref 4.0–10.5)
nRBC: 0.2 % (ref 0.0–0.2)

## 2020-10-04 MED ORDER — IPRATROPIUM-ALBUTEROL 0.5-2.5 (3) MG/3ML IN SOLN
3.0000 mL | Freq: Three times a day (TID) | RESPIRATORY_TRACT | Status: DC
  Administered 2020-10-04 – 2020-10-07 (×11): 3 mL via RESPIRATORY_TRACT
  Filled 2020-10-04 (×11): qty 3

## 2020-10-04 NOTE — Progress Notes (Signed)
Pt refused CPAP qhs.  Pt states that she does not use CPAP at home and does not wish to wear while in the hospital.  Pt encouraged to contact RT should she change her mind.

## 2020-10-04 NOTE — Progress Notes (Signed)
PROGRESS NOTE  Cynthia Becker  DOB: 19-Jul-1970  PCP: [name], NP VCB:[id number]  DOA: 10/02/2020  LOS: 2 days   Chief Complaint  Patient presents with  . Shortness of Breath  . Nasal Congestion    Brief narrative: Tritia Endo is a 50 y.o. female with PMH significant for morbid obesity, DM2, HTN, HLD, diastolic CHF, pulmonary hypertension, no OSA per patient with limited mobility recently got a wheelchair delivered to her. Patient presented to the ED on 4/11 with complaint of progressively worsening shortness of breath of the last 5 days, refractory to inhalers, 20 pound weight gain in 2 months. In the ED, patient was afebrile, blood pressure elevated up to 171/112 Labs with BNP not elevated, troponin less than 2, D-dimer slightly elevated, Covid PCR negative. CT angio of chest with possible pneumonia in the left lower lobe, areas of mild adenopathy Patient was admitted to hospitalist service for further evaluation management. See below for details  Subjective: Patient was seen and examined this morning. lying on bed.  Not in distress.  Remains intubated ophthalmologically. Has a pubic catheter on.  More than 3 L of urine output last 24 hours  Assessment/Plan: Acute hypoxic respiratory failure -Likely secondary to CHF exacerbation. -Currently on 2 L oxygen by nasal cannula.  At home, she was on oxygen only at night. -Wean down as tolerated.  Acute exacerbation of diastolic CHF Essential hypertension -Presented with progressive shortness of breath, 20 pound weight gain in 69-month.   -Home meds include BiDil 20/37.5 mg twice daily, Aldactone 50 mg daily, Lasix 40 mg daily as needed. -Echocardiogram 4/12 showed EF 60 to 65%, normal LV function. -Continue IV Lasix 40 mg twice daily. -Continue BiDil and Aldactone. -Net IO Since Admission: -3,130 mL [10/04/20 1509] -Continue to monitor for daily intake output, weight, blood pressure, BNP, renal function and  electrolytes. Recent Labs  Lab 10/02/20 1227 10/03/20 0526 10/04/20 0612  BNP 17.9  --   --   BUN 9 11 18   CREATININE 0.93 0.78 0.83  K 4.2 4.9 4.3   Abnormal CT chest -CT scan of chest suggested left lower lobe pneumonia.  However with no fever, normal WBC count, normal procalcitonin.  I do not think patient has an infectious process going on.  Antibiotics stopped on 4/12. Recent Labs  Lab 10/02/20 1443 10/02/20 1524 10/03/20 0526 10/04/20 0612  WBC  --  8.5 8.3 10.3  PROCALCITON <0.10  --   --   --    History of asthma, pulmonary hypertension No history of OSA per patient -Continue albuterol, Advair Diskus, Robitussin, Singulair 10 mg daily -I do not think patient needs IV or oral steroids at this time. -Patient states he had sleep study done 3 times in the last few years and all of them were negative.  Type 2 diabetes with neuropathy -A1c 7.2 on 10/02/2020.   -On glipizide 5 mg twice daily at home.  -Currently on sliding scale insulin with Accu-Cheks.  -Continue Neurontin. Recent Labs  Lab 10/03/20 1146 10/03/20 1742 10/03/20 2148 10/04/20 0737 10/04/20 1252  GLUCAP 277* 167* 258* 149* 148*   Hyperlipidemia Not on meds at home  Morbid obesity  -Body mass index is 82.97 kg/m. Patient has been advised to make an attempt to improve diet and exercise patterns to aid in weight loss.  Anxiety -Continue Seroquel 400 mg at bedtime  Chronic back pain -Continue Flexeril 5 mg 3 times daily as needed, ibuprofen as needed, oxycodone 10 mg every 6 hours  as needed,  Constipation -Linzess 290 mcg daily,  GERD -Prilosec 20 mg at bedtime  Impaired mobility -Patient discharged she has difficulty with ambulation because of her weight, back pain.  She just got wheelchair delivered at her house. -Obtain PT evaluation  Mobility: PT ordered. Code Status:   Code Status: Full Code  Nutritional status: Body mass index is 82.97 kg/m.     Diet Order            Diet  heart healthy/carb modified Room service appropriate? Yes; Fluid consistency: Thin  Diet effective now                 DVT prophylaxis: Lovenox subcu   Antimicrobials:  Not on antibiotics Fluid: None Consultants: None Family Communication:  Husband at bedside  Status is: Inpatient  Remains inpatient appropriate because -patient needs IV diuresis  Dispo: The patient is from: Home              Anticipated d/c is to: Home in 1 to 2 days              Patient currently is not medically stable to d/c.   Difficult to place patient No       Infusions:    Scheduled Meds: . enoxaparin (LOVENOX) injection  100 mg Subcutaneous Q24H  . furosemide  40 mg Intravenous BID  . gabapentin  600 mg Oral TID  . insulin aspart  0-20 Units Subcutaneous TID WC  . insulin aspart  0-5 Units Subcutaneous QHS  . insulin aspart  6 Units Subcutaneous TID WC  . insulin glargine  20 Units Subcutaneous QHS  . ipratropium-albuterol  3 mL Nebulization TID  . isosorbide-hydrALAZINE  1 tablet Oral BID  . mometasone-formoterol  2 puff Inhalation BID  . montelukast  10 mg Oral QHS  . pantoprazole  40 mg Oral Daily  . QUEtiapine  400 mg Oral QHS  . spironolactone  50 mg Oral Daily    Antimicrobials: Anti-infectives (From admission, onward)   Start     Dose/Rate Route Frequency Ordered Stop   10/03/20 1000  cefTRIAXone (ROCEPHIN) 2 g in sodium chloride 0.9 % 100 mL IVPB  Status:  Discontinued        2 g 200 mL/hr over 30 Minutes Intravenous Every 24 hours 10/02/20 1557 10/03/20 1119   10/02/20 2200  azithromycin (ZITHROMAX) 500 mg in sodium chloride 0.9 % 250 mL IVPB  Status:  Discontinued        500 mg 250 mL/hr over 60 Minutes Intravenous Every 24 hours 10/02/20 1557 10/03/20 1126   10/02/20 1515  cefTRIAXone (ROCEPHIN) 1 g in sodium chloride 0.9 % 100 mL IVPB        1 g 200 mL/hr over 30 Minutes Intravenous  Once 10/02/20 1505 10/02/20 1604   10/02/20 1515  doxycycline (VIBRA-TABS) tablet 100 mg         100 mg Oral  Once 10/02/20 1505 10/02/20 1513      PRN meds: acetaminophen **OR** acetaminophen, albuterol, hydrALAZINE, morphine injection, ondansetron (ZOFRAN) IV, oxyCODONE, polyethylene glycol   Objective: Vitals:   10/04/20 0540 10/04/20 0833  BP: (!) 166/94   Pulse: 86   Resp: 18   Temp: 98.2 F (36.8 C)   SpO2: 97% 94%    Intake/Output Summary (Last 24 hours) at 10/04/2020 1509 Last data filed at 10/04/2020 0540 Gross per 24 hour  Intake 240 ml  Output 4000 ml  Net -3760 ml   [employer]  10/02/20 1127 10/02/20 1359  Weight: (!) 217.7 kg (!) 226.2 kg   Weight change:  Body mass index is 82.97 kg/m.   Physical Exam: General exam: Morbidly obese, African-American female Skin: No rashes, lesions or ulcers. HEENT: Atraumatic, normocephalic, no obvious bleeding Lungs: Clear to auscultation bilaterally CVS: Regular rate and rhythm, no murmur GI/Abd soft, distended from obesity, nontender CNS: Alert, awake, oriented x3 Psychiatry: Mood appropriate Extremities: Chronic bilateral lymphedema with stasis changes, skin thickening.  Data Review: I have personally reviewed the laboratory data and studies available.  Recent Labs  Lab 10/02/20 1524 10/03/20 0526 10/04/20 0612  WBC 8.5 8.3 10.3  NEUTROABS 5.6  --   --   HGB 14.8 15.2* 14.9  HCT 46.7* 47.8* 45.8  MCV 79.7* 79.0* 78.0*  PLT 260 292 285   Recent Labs  Lab 10/02/20 1227 10/03/20 0526 10/04/20 0612  NA 137 138 137  K 4.2 4.9 4.3  CL 98 96* 95*  CO2 31 30 32  GLUCOSE 120* 176* 155*  BUN 9 11 18   CREATININE 0.93 0.78 0.83  CALCIUM 8.6* 9.2 9.0    F/u labs ordered Unresulted Labs (From admission, onward)          Start     Ordered   10/03/20 7375  Basic metabolic panel  Daily,   R      10/02/20 1557   10/03/20 0500  CBC  Daily,   R      10/02/20 1557          Signed, Terrilee Croak, MD [facility] 10/04/2020

## 2020-10-04 NOTE — Evaluation (Addendum)
Physical Therapy Evaluation Patient Details Name: Cynthia Becker MRN: [id number] DOB: 19-Aug-1970 Today's Date: 10/04/2020   History of Present Illness  50 yo female admitted with acute respiratory failure with hypoxia. Hx of morbid obesity, OSA, DM, PAH, CHF, wears O2 @ night.  Clinical Impression  On eval, pt was Min guard assist for mobility. She walked ~15 feet x 2 in the room. O2 82% on RA, dyspnea 3/4 + wheezing with ambulation. O2 92% on 2L end of session. Will plan to follow and progress activity as able.     Follow Up Recommendations Supervision for mobility/OOB    Equipment Recommendations  None recommended by PT    Recommendations for Other Services       Precautions / Restrictions Precautions Precautions: Fall Precaution Comments: monitor O2 Restrictions Weight Bearing Restrictions: No      Mobility  Bed Mobility Overal bed mobility: Modified Independent                  Transfers Overall transfer level: Modified independent                  Ambulation/Gait Ambulation/Gait assistance: Min guard Gait Distance (Feet): 15 Feet (x2) Assistive device: None Gait Pattern/deviations: Step-through pattern;Decreased stride length     General Gait Details: Min guard for safety. O2 82% on RA, dyspnea 3/4, audible wheezing. Pt fatigues very easily.  Stairs            Wheelchair Mobility    Modified Rankin (Stroke Patients Only)       Balance Overall balance assessment: Needs assistance           Standing balance-Leahy Scale: Fair                               Pertinent Vitals/Pain Pain Assessment: Faces Faces Pain Scale: Hurts even more Pain Location: back Pain Descriptors / Indicators: Discomfort;Sore;Aching Pain Intervention(s): Limited activity within patient's tolerance;Monitored during session;Repositioned    Home Living Family/patient expects to be discharged to:: Private residence Living Arrangements:  Spouse/significant other Available Help at Discharge: Family Type of Home: House Home Access: Stairs to enter Entrance Stairs-Rails: None Entrance Stairs-Number of Steps: 1 Home Layout: One level Home Equipment: Environmental consultant - 2 wheels;Cane - single point;Shower seat Additional Comments: pt uses walk in shower with bench to bath self. she reports often she furniture surfs in her house to steady self with gait. reports getting a w/c for home    Prior Function Level of Independence: Independent         Comments: "furniture walks" sometimes; now uses power wheelchair primarily     Hand Dominance   Dominant Hand: Right    Extremity/Trunk Assessment   Upper Extremity Assessment Upper Extremity Assessment: Overall WFL for tasks assessed    Lower Extremity Assessment Lower Extremity Assessment: Generalized weakness    Cervical / Trunk Assessment Cervical / Trunk Assessment: Normal  Communication   Communication: No difficulties  Cognition Arousal/Alertness: Awake/alert Behavior During Therapy: WFL for tasks assessed/performed Overall Cognitive Status: Within Functional Limits for tasks assessed                                        General Comments      Exercises     Assessment/Plan    PT Assessment Patient needs continued PT services  PT Problem  List Decreased mobility;Decreased activity tolerance;Obesity       PT Treatment Interventions Gait training;Therapeutic exercise;Balance training;Functional mobility training;Therapeutic activities;Patient/family education    PT Goals (Current goals can be found in the Care Plan section)  Acute Rehab PT Goals Patient Stated Goal: breathe better PT Goal Formulation: With patient/family Time For Goal Achievement: 10/18/20 Potential to Achieve Goals: Fair    Frequency Min 3X/week   Barriers to discharge        Co-evaluation               AM-PAC PT "6 Clicks" Mobility  Outcome Measure Help  needed turning from your back to your side while in a flat bed without using bedrails?: None Help needed moving from lying on your back to sitting on the side of a flat bed without using bedrails?: None Help needed moving to and from a bed to a chair (including a wheelchair)?: A Little Help needed standing up from a chair using your arms (e.g., wheelchair or bedside chair)?: A Little Help needed to walk in hospital room?: A Little Help needed climbing 3-5 steps with a railing? : A Lot 6 Click Score: 19    End of Session Equipment Utilized During Treatment: Oxygen Activity Tolerance: Patient limited by fatigue (limited by dyspnea)     PT Visit Diagnosis: Difficulty in walking, not elsewhere classified (R26.2)    Time: 1100-1117 PT Time Calculation (min) (ACUTE ONLY): 17 min   Charges:   PT Evaluation $PT Eval Moderate Complexity: 1 Mod             Doreatha Massed, PT Acute Rehabilitation  Office: [phone] Pager: [phone]

## 2020-10-05 DIAGNOSIS — J9601 Acute respiratory failure with hypoxia: Secondary | ICD-10-CM | POA: Diagnosis not present

## 2020-10-05 LAB — CBC
HCT: 49 % — ABNORMAL HIGH (ref 36.0–46.0)
Hemoglobin: 15.7 g/dL — ABNORMAL HIGH (ref 12.0–15.0)
MCH: 25.2 pg — ABNORMAL LOW (ref 26.0–34.0)
MCHC: 32 g/dL (ref 30.0–36.0)
MCV: 78.5 fL — ABNORMAL LOW (ref 80.0–100.0)
Platelets: 291 10*3/uL (ref 150–400)
RBC: 6.24 MIL/uL — ABNORMAL HIGH (ref 3.87–5.11)
RDW: 15.9 % — ABNORMAL HIGH (ref 11.5–15.5)
WBC: 9.5 10*3/uL (ref 4.0–10.5)
nRBC: 0 % (ref 0.0–0.2)

## 2020-10-05 LAB — BASIC METABOLIC PANEL
Anion gap: 9 (ref 5–15)
BUN: 23 mg/dL — ABNORMAL HIGH (ref 6–20)
CO2: 36 mmol/L — ABNORMAL HIGH (ref 22–32)
Calcium: 8.9 mg/dL (ref 8.9–10.3)
Chloride: 94 mmol/L — ABNORMAL LOW (ref 98–111)
Creatinine, Ser: 0.92 mg/dL (ref 0.44–1.00)
GFR, Estimated: >60 mL/min (ref >60)
Glucose, Bld: 133 mg/dL — ABNORMAL HIGH (ref 70–99)
Potassium: 3.9 mmol/L (ref 3.5–5.1)
Sodium: 139 mmol/L (ref 135–145)

## 2020-10-05 LAB — GLUCOSE, CAPILLARY
Glucose-Capillary: 184 mg/dL — ABNORMAL HIGH (ref 70–99)
Glucose-Capillary: 141 mg/dL — ABNORMAL HIGH (ref 70–99)
Glucose-Capillary: 228 mg/dL — ABNORMAL HIGH (ref 70–99)
Glucose-Capillary: 184 mg/dL — ABNORMAL HIGH (ref 70–99)

## 2020-10-05 MED ORDER — GLIPIZIDE 5 MG PO TABS
5.0000 mg | ORAL_TABLET | Freq: Two times a day (BID) | ORAL | Status: DC
  Administered 2020-10-05 – 2020-10-07 (×4): 5 mg via ORAL
  Filled 2020-10-05 (×4): qty 1

## 2020-10-05 NOTE — Progress Notes (Signed)
Continues to refuse nocturnal CPAP and denies the use of CPAP at home. Order changed to prn. She is aware that she may call for assistance if she should change her mind and decide to try it.

## 2020-10-05 NOTE — Progress Notes (Signed)
PROGRESS NOTE  Cynthia Becker  DOB: 1971/02/23  PCP: [name], NP JXB:[id number]  DOA: 10/02/2020  LOS: 3 days   Chief Complaint  Patient presents with  . Shortness of Breath  . Nasal Congestion    Brief narrative: Cynthia Becker is a 50 y.o. female with PMH significant for morbid obesity, DM2, HTN, HLD, diastolic CHF, pulmonary hypertension, no OSA per patient with limited mobility recently got a wheelchair delivered to her. Patient presented to the ED on 4/11 with complaint of progressively worsening shortness of breath of the last 5 days, refractory to inhalers, 20 pound weight gain in 2 months. In the ED, patient was afebrile, blood pressure elevated up to 171/112 Labs with BNP not elevated, troponin less than 2, D-dimer slightly elevated, Covid PCR negative. CT angio of chest with possible pneumonia in the left lower lobe, areas of mild adenopathy Patient was admitted to hospitalist service for further evaluation management. See below for details  Subjective: Patient was seen and examined this morning. lying on bed.  Not in distress.  Remains intubated ophthalmologically. Has a pubic catheter on.  More than 3 L of urine output last 24 hours.  Total negative balance of about 7 L since admission  Assessment/Plan: Acute hypoxic respiratory failure -Likely secondary to CHF exacerbation. -Currently on 2 L oxygen by nasal cannula.  At home, she was on oxygen only at night. -Wean down as tolerated.  Acute exacerbation of diastolic CHF Essential hypertension -Presented with progressive shortness of breath, 20 pound weight gain in 24-month.   -Home meds include BiDil 20/37.5 mg twice daily, Aldactone 50 mg daily, Lasix 40 mg daily as needed. -Echocardiogram 4/12 showed EF 60 to 65%, normal LV function. -Currently on IV Lasix 40 mg twice daily.  Appropriately diuresing. -Continue BiDil and Aldactone. -Net IO Since Admission: -6,830 mL [10/05/20 1600] -Continue to monitor for  daily intake output, weight, blood pressure, BNP, renal function and electrolytes. Recent Labs  Lab 10/02/20 1227 10/03/20 0526 10/04/20 0612 10/05/20 0533  BNP 17.9  --   --   --   BUN 9 11 18  23*  CREATININE 0.93 0.78 0.83 0.92  K 4.2 4.9 4.3 3.9   Abnormal CT chest -CT scan of chest suggested left lower lobe pneumonia.  However with no fever, normal WBC count, normal procalcitonin.  I do not think patient has an infectious process going on.  Antibiotics stopped on 4/12. Recent Labs  Lab 10/02/20 1443 10/02/20 1524 10/03/20 0526 10/04/20 0612 10/05/20 0533  WBC  --  8.5 8.3 10.3 9.5  PROCALCITON <0.10  --   --   --   --    History of asthma, pulmonary hypertension No history of OSA per patient -Continue albuterol, Advair Diskus, Robitussin, Singulair 10 mg daily -I do not think patient needs IV or oral steroids at this time. -Patient states she had sleep study done 3 times in the last few years and all of them were negative.  Type 2 diabetes with neuropathy -A1c 7.2 on 10/02/2020.   -On glipizide 5 mg twice daily at home.  -Currently on sliding scale insulin with Accu-Cheks.  Blood sugar level over 200 today.  -Resume glipizide. -Continue Neurontin. Recent Labs  Lab 10/04/20 1252 10/04/20 1720 10/04/20 2110 10/05/20 0728 10/05/20 1229  GLUCAP 148* 124* 184* 141* 228*   Hyperlipidemia -Not on meds at home  Morbid obesity  -Body mass index is 80.34 kg/m. Patient has been advised to make an attempt to improve diet and  exercise patterns to aid in weight loss.  Anxiety -Continue Seroquel 400 mg at bedtime  Chronic back pain -Continue Flexeril 5 mg 3 times daily as needed, ibuprofen as needed, oxycodone 10 mg every 6 hours as needed,  Constipation -Linzess 290 mcg daily,  GERD -Prilosec 20 mg at bedtime  Impaired mobility -Patient discharged she has difficulty with ambulation because of her weight, back pain. She just got wheelchair delivered at her  house. -PT evaluation obtained.  Mobility: PT ordered. Code Status:   Code Status: Full Code  Nutritional status: Body mass index is 80.34 kg/m.     Diet Order            Diet heart healthy/carb modified Room service appropriate? Yes; Fluid consistency: Thin  Diet effective now                 DVT prophylaxis: Lovenox subcu   Antimicrobials:  Not on antibiotics Fluid: None Consultants: None Family Communication:  Husband at bedside  Status is: Inpatient  Remains inpatient appropriate because -patient continues to need further IV diuresis  Dispo: The patient is from: Home              Anticipated d/c is to: Home in 1 to 2 days              Patient currently is not medically stable to d/c.   Difficult to place patient No       Infusions:    Scheduled Meds: . enoxaparin (LOVENOX) injection  100 mg Subcutaneous Q24H  . furosemide  40 mg Intravenous BID  . gabapentin  600 mg Oral TID  . glipiZIDE  5 mg Oral BID AC  . insulin aspart  0-20 Units Subcutaneous TID WC  . insulin aspart  0-5 Units Subcutaneous QHS  . insulin aspart  6 Units Subcutaneous TID WC  . insulin glargine  20 Units Subcutaneous QHS  . ipratropium-albuterol  3 mL Nebulization TID  . isosorbide-hydrALAZINE  1 tablet Oral BID  . mometasone-formoterol  2 puff Inhalation BID  . montelukast  10 mg Oral QHS  . pantoprazole  40 mg Oral Daily  . QUEtiapine  400 mg Oral QHS  . spironolactone  50 mg Oral Daily    Antimicrobials: Anti-infectives (From admission, onward)   Start     Dose/Rate Route Frequency Ordered Stop   10/03/20 1000  cefTRIAXone (ROCEPHIN) 2 g in sodium chloride 0.9 % 100 mL IVPB  Status:  Discontinued        2 g 200 mL/hr over 30 Minutes Intravenous Every 24 hours 10/02/20 1557 10/03/20 1119   10/02/20 2200  azithromycin (ZITHROMAX) 500 mg in sodium chloride 0.9 % 250 mL IVPB  Status:  Discontinued        500 mg 250 mL/hr over 60 Minutes Intravenous Every 24 hours 10/02/20  1557 10/03/20 1126   10/02/20 1515  cefTRIAXone (ROCEPHIN) 1 g in sodium chloride 0.9 % 100 mL IVPB        1 g 200 mL/hr over 30 Minutes Intravenous  Once 10/02/20 1505 10/02/20 1604   10/02/20 1515  doxycycline (VIBRA-TABS) tablet 100 mg        100 mg Oral  Once 10/02/20 1505 10/02/20 1513      PRN meds: acetaminophen **OR** acetaminophen, albuterol, hydrALAZINE, morphine injection, ondansetron (ZOFRAN) IV, oxyCODONE, polyethylene glycol   Objective: Vitals:   10/05/20 0727 10/05/20 1356  BP:    Pulse:    Resp:    Temp:  SpO2: 95% 92%    Intake/Output Summary (Last 24 hours) at 10/05/2020 1600 Last data filed at 10/05/2020 0646 Gross per 24 hour  Intake --  Output 2000 ml  Net -2000 ml   Filed Weights   10/02/20 1127 10/02/20 1359 10/05/20 0641  Weight: (!) 217.7 kg (!) 226.2 kg (!) 219 kg   Weight change:  Body mass index is 80.34 kg/m.   Physical Exam: General exam: Morbidly obese, African-American female Skin: No rashes, lesions or ulcers. HEENT: Atraumatic, normocephalic, no obvious bleeding Lungs: Clear to auscultation bilaterally CVS: Regular rate and rhythm, no murmur GI/Abd soft, distended from obesity, nontender CNS: Alert, awake, oriented x3 Psychiatry: Mood appropriate Extremities: Chronic bilateral lymphedema with stasis changes, skin thickening.  Data Review: I have personally reviewed the laboratory data and studies available.  Recent Labs  Lab 10/02/20 1524 10/03/20 0526 10/04/20 0612 10/05/20 0533  WBC 8.5 8.3 10.3 9.5  NEUTROABS 5.6  --   --   --   HGB 14.8 15.2* 14.9 15.7*  HCT 46.7* 47.8* 45.8 49.0*  MCV 79.7* 79.0* 78.0* 78.5*  PLT 260 292 285 291   Recent Labs  Lab 10/02/20 1227 10/03/20 0526 10/04/20 0612 10/05/20 0533  NA 137 138 137 139  K 4.2 4.9 4.3 3.9  CL 98 96* 95* 94*  CO2 31 30 32 36*  GLUCOSE 120* 176* 155* 133*  BUN 9 11 18  23*  CREATININE 0.93 0.78 0.83 0.92  CALCIUM 8.6* 9.2 9.0 8.9    F/u labs  ordered Unresulted Labs (From admission, onward)          Start     Ordered   10/06/20 0500  Magnesium  Tomorrow morning,   STAT        10/05/20 1600   10/06/20 0500  Phosphorus  Tomorrow morning,   R        10/05/20 1600   10/03/20 0093  Basic metabolic panel  Daily,   R      10/02/20 1557   10/03/20 0500  CBC  Daily,   R      10/02/20 1557          Signed, Terrilee Croak, MD [facility] 10/05/2020

## 2020-10-06 DIAGNOSIS — J9601 Acute respiratory failure with hypoxia: Secondary | ICD-10-CM | POA: Diagnosis not present

## 2020-10-06 LAB — CBC
HCT: 50.7 % — ABNORMAL HIGH (ref 36.0–46.0)
Hemoglobin: 16.2 g/dL — ABNORMAL HIGH (ref 12.0–15.0)
MCH: 25.3 pg — ABNORMAL LOW (ref 26.0–34.0)
MCHC: 32 g/dL (ref 30.0–36.0)
MCV: 79.2 fL — ABNORMAL LOW (ref 80.0–100.0)
Platelets: 280 10*3/uL (ref 150–400)
RBC: 6.4 MIL/uL — ABNORMAL HIGH (ref 3.87–5.11)
RDW: 16.2 % — ABNORMAL HIGH (ref 11.5–15.5)
WBC: 8.9 10*3/uL (ref 4.0–10.5)
nRBC: 0 % (ref 0.0–0.2)

## 2020-10-06 LAB — GLUCOSE, CAPILLARY
Glucose-Capillary: 180 mg/dL — ABNORMAL HIGH (ref 70–99)
Glucose-Capillary: 146 mg/dL — ABNORMAL HIGH (ref 70–99)
Glucose-Capillary: 117 mg/dL — ABNORMAL HIGH (ref 70–99)
Glucose-Capillary: 167 mg/dL — ABNORMAL HIGH (ref 70–99)
Glucose-Capillary: 183 mg/dL — ABNORMAL HIGH (ref 70–99)

## 2020-10-06 LAB — BASIC METABOLIC PANEL
Anion gap: 9 (ref 5–15)
BUN: 28 mg/dL — ABNORMAL HIGH (ref 6–20)
CO2: 36 mmol/L — ABNORMAL HIGH (ref 22–32)
Calcium: 8.5 mg/dL — ABNORMAL LOW (ref 8.9–10.3)
Chloride: 93 mmol/L — ABNORMAL LOW (ref 98–111)
Creatinine, Ser: 1.01 mg/dL — ABNORMAL HIGH (ref 0.44–1.00)
GFR, Estimated: >60 mL/min (ref >60)
Glucose, Bld: 138 mg/dL — ABNORMAL HIGH (ref 70–99)
Potassium: 3.8 mmol/L (ref 3.5–5.1)
Sodium: 138 mmol/L (ref 135–145)

## 2020-10-06 LAB — MAGNESIUM: Magnesium: 2 mg/dL (ref 1.7–2.4)

## 2020-10-06 LAB — PHOSPHORUS: Phosphorus: 5.1 mg/dL — ABNORMAL HIGH (ref 2.5–4.6)

## 2020-10-06 MED ORDER — TORSEMIDE 20 MG PO TABS
40.0000 mg | ORAL_TABLET | Freq: Every day | ORAL | Status: DC
  Administered 2020-10-06 – 2020-10-07 (×2): 40 mg via ORAL
  Filled 2020-10-06 (×2): qty 2

## 2020-10-06 MED ORDER — CARVEDILOL 3.125 MG PO TABS
3.1250 mg | ORAL_TABLET | Freq: Two times a day (BID) | ORAL | Status: DC
  Administered 2020-10-06 – 2020-10-07 (×2): 3.125 mg via ORAL
  Filled 2020-10-06 (×2): qty 1

## 2020-10-06 NOTE — Progress Notes (Signed)
Physical Therapy Treatment Patient Details Name: Cynthia Becker MRN: [id number] DOB: 1970-12-01 Today's Date: 10/06/2020    History of Present Illness 50 yo female admitted with acute respiratory failure with hypoxia. Hx of morbid obesity, OSA, DM, PAH, CHF, wears O2 @ night.    PT Comments    Pt cooperative and willing to mobilize in room.  Pt up to bathroom and ambulated around room several times with seated rest breaks between and reliant on touching walls and furniture for stability.  Pt states she does have a RW but this is how she moves at home and that her ambulation at home is limited by back and LE pain.     Follow Up Recommendations  Supervision for mobility/OOB     Equipment Recommendations  None recommended by PT    Recommendations for Other Services       Precautions / Restrictions Precautions Precautions: Fall Precaution Comments: monitor O2 Restrictions Weight Bearing Restrictions: No    Mobility  Bed Mobility Overal bed mobility: Modified Independent                  Transfers Overall transfer level: Modified independent                  Ambulation/Gait Ambulation/Gait assistance: Min guard Gait Distance (Feet): 30 Feet (15' twice to/from bathroom and 30' twice around room) Assistive device: None Gait Pattern/deviations: Step-through pattern;Decreased stride length Gait velocity: decr   General Gait Details: min guard for safety, 2/4 dyspnea on 2L; decreased pace with increased BOS, rocking gait and min knee flex noted.   Stairs             Wheelchair Mobility    Modified Rankin (Stroke Patients Only)       Balance Overall balance assessment: Needs assistance Sitting-balance support: No upper extremity supported;Feet supported Sitting balance-Leahy Scale: Good       Standing balance-Leahy Scale: Fair                              Cognition Arousal/Alertness: Awake/alert Behavior During Therapy: WFL for  tasks assessed/performed Overall Cognitive Status: Within Functional Limits for tasks assessed                                        Exercises      General Comments        Pertinent Vitals/Pain Pain Assessment: Faces Faces Pain Scale: Hurts even more Pain Location: back and legs Pain Descriptors / Indicators: Discomfort;Sore;Aching Pain Intervention(s): Limited activity within patient's tolerance;Monitored during session    Home Living                      Prior Function            PT Goals (current goals can now be found in the care plan section) Acute Rehab PT Goals Patient Stated Goal: breathe better PT Goal Formulation: With patient/family Time For Goal Achievement: 10/18/20 Potential to Achieve Goals: Fair Progress towards PT goals: Progressing toward goals    Frequency    Min 3X/week      PT Plan Current plan remains appropriate    Co-evaluation              AM-PAC PT "6 Clicks" Mobility   Outcome Measure  Help needed turning from your back to  your side while in a flat bed without using bedrails?: None Help needed moving from lying on your back to sitting on the side of a flat bed without using bedrails?: None Help needed moving to and from a bed to a chair (including a wheelchair)?: A Little Help needed standing up from a chair using your arms (e.g., wheelchair or bedside chair)?: A Little Help needed to walk in hospital room?: A Little Help needed climbing 3-5 steps with a railing? : A Lot 6 Click Score: 19    End of Session Equipment Utilized During Treatment: Oxygen Activity Tolerance: Patient limited by fatigue Patient left: in bed;with call bell/phone within reach Nurse Communication: Mobility status PT Visit Diagnosis: Difficulty in walking, not elsewhere classified (R26.2)     Time: 8016-5537 PT Time Calculation (min) (ACUTE ONLY): 22 min  Charges:  $Gait Training: 8-22 mins                     [place] Pager [phone] Office [phone]    Cynthia Becker 10/06/2020, 5:28 PM

## 2020-10-06 NOTE — Progress Notes (Signed)
PROGRESS NOTE  Cynthia Becker  DOB: 06/17/1971  PCP: [name], NP ASN:[id number]  DOA: 10/02/2020  LOS: 4 days   Chief Complaint  Patient presents with  . Shortness of Breath  . Nasal Congestion    Brief narrative: Alyssamarie Mounsey is a 50 y.o. female with PMH significant for morbid obesity, DM2, HTN, HLD, diastolic CHF, pulmonary hypertension, no OSA per patient with limited mobility recently got a wheelchair delivered to her. Patient presented to the ED on 4/11 with complaint of progressively worsening shortness of breath of the last 5 days, refractory to inhalers, 20 pound weight gain in 2 months. In the ED, patient was afebrile, blood pressure elevated up to 171/112 Labs with BNP not elevated, troponin less than 2, D-dimer slightly elevated, Covid PCR negative. CT angio of chest with possible pneumonia in the left lower lobe, areas of mild adenopathy Patient was admitted to hospitalist service for further evaluation management. See below for details  10/06/2020: Patient seen. Still not keen on CPAP. History of cigarette use,continuous (Counseled). Will discontinue IV Lasix (Elevation in serum creatinine noted). Cumulative negative fluid balance of 8.9L. Start Torsemide 40 mg po once daily and Coreg 3.125 mg po bid. Possible discharge in the morning. Consider follow up with Pulmonary team on discharge.  Subjective: No SOB No chest pain  Assessment/Plan: Acute hypoxic respiratory failure -Likely secondary to CHF exacerbation. -Currently on 2 L oxygen by nasal cannula.  At home, she was on oxygen only at night. -Wean down as tolerated.  Acute exacerbation of diastolic CHF Essential hypertension -Presented with progressive shortness of breath, 20 pound weight gain in 61-month.   -Home meds include BiDil 20/37.5 mg twice daily, Aldactone 50 mg daily, Lasix 40 mg daily as needed. -Echocardiogram 4/12 showed EF 60 to 65%, normal LV function. -Currently on IV Lasix 40 mg twice  daily.  Appropriately diuresing. -Continue BiDil and Aldactone. -Net IO Since Admission: -8,930 mL [10/06/20 1314] -Continue to monitor for daily intake output, weight, blood pressure, BNP, renal function and electrolytes. Recent Labs  Lab 10/02/20 1227 10/03/20 0526 10/04/20 0612 10/05/20 0533 10/06/20 0518  BNP 17.9  --   --   --   --   BUN 9 11 18  23* 28*  CREATININE 0.93 0.78 0.83 0.92 1.01*  K 4.2 4.9 4.3 3.9 3.8  MG  --   --   --   --  2.0   Abnormal CT chest -CT scan of chest suggested left lower lobe pneumonia.  However with no fever, normal WBC count, normal procalcitonin.  I do not think patient has an infectious process going on.  Antibiotics stopped on 4/12. Recent Labs  Lab 10/02/20 1443 10/02/20 1524 10/03/20 0526 10/04/20 0612 10/05/20 0533 10/06/20 0518  WBC  --  8.5 8.3 10.3 9.5 8.9  PROCALCITON <0.10  --   --   --   --   --    History of asthma, pulmonary hypertension No history of OSA per patient -Continue albuterol, Advair Diskus, Robitussin, Singulair 10 mg daily -I do not think patient needs IV or oral steroids at this time. -Patient states she had sleep study done 3 times in the last few years and all of them were negative.  Type 2 diabetes with neuropathy -A1c 7.2 on 10/02/2020.   -On glipizide 5 mg twice daily at home.  -Currently on sliding scale insulin with Accu-Cheks.  Blood sugar level over 200 today.  -Resume glipizide. -Continue Neurontin. Recent Labs  Lab 10/05/20 1229  10/05/20 1739 10/05/20 2134 10/06/20 0737 10/06/20 1159  GLUCAP 228* 184* 180* 146* 117*   Hyperlipidemia -Not on meds at home  Morbid obesity  -Body mass index is 80.34 kg/m. Patient has been advised to make an attempt to improve diet and exercise patterns to aid in weight loss.  Anxiety -Continue Seroquel 400 mg at bedtime  Chronic back pain -Continue Flexeril 5 mg 3 times daily as needed, ibuprofen as needed, oxycodone 10 mg every 6 hours as  needed,  Constipation -Linzess 290 mcg daily,  GERD -Prilosec 20 mg at bedtime  Impaired mobility -Patient discharged she has difficulty with ambulation because of her weight, back pain. She just got wheelchair delivered at her house. -PT evaluation obtained.  Mobility: PT ordered. Code Status:   Code Status: Full Code  Nutritional status: Body mass index is 80.34 kg/m.     Diet Order            Diet heart healthy/carb modified Room service appropriate? Yes; Fluid consistency: Thin  Diet effective now                 DVT prophylaxis: Lovenox subcu   Antimicrobials:  Not on antibiotics Fluid: None Consultants: None Family Communication:  Husband at bedside  Status is: Inpatient  Remains inpatient appropriate because -patient continues to need further IV diuresis  Dispo: The patient is from: Home              Anticipated d/c is to: Home in 1 to 2 days              Patient currently is not medically stable to d/c.   Difficult to place patient No       Infusions:    Scheduled Meds: . carvedilol  3.125 mg Oral BID WC  . enoxaparin (LOVENOX) injection  100 mg Subcutaneous Q24H  . gabapentin  600 mg Oral TID  . glipiZIDE  5 mg Oral BID AC  . insulin aspart  0-20 Units Subcutaneous TID WC  . insulin aspart  0-5 Units Subcutaneous QHS  . insulin aspart  6 Units Subcutaneous TID WC  . insulin glargine  20 Units Subcutaneous QHS  . ipratropium-albuterol  3 mL Nebulization TID  . isosorbide-hydrALAZINE  1 tablet Oral BID  . mometasone-formoterol  2 puff Inhalation BID  . montelukast  10 mg Oral QHS  . pantoprazole  40 mg Oral Daily  . QUEtiapine  400 mg Oral QHS  . spironolactone  50 mg Oral Daily  . torsemide  40 mg Oral Daily    Antimicrobials: Anti-infectives (From admission, onward)   Start     Dose/Rate Route Frequency Ordered Stop   10/03/20 1000  cefTRIAXone (ROCEPHIN) 2 g in sodium chloride 0.9 % 100 mL IVPB  Status:  Discontinued        2 g 200  mL/hr over 30 Minutes Intravenous Every 24 hours 10/02/20 1557 10/03/20 1119   10/02/20 2200  azithromycin (ZITHROMAX) 500 mg in sodium chloride 0.9 % 250 mL IVPB  Status:  Discontinued        500 mg 250 mL/hr over 60 Minutes Intravenous Every 24 hours 10/02/20 1557 10/03/20 1126   10/02/20 1515  cefTRIAXone (ROCEPHIN) 1 g in sodium chloride 0.9 % 100 mL IVPB        1 g 200 mL/hr over 30 Minutes Intravenous  Once 10/02/20 1505 10/02/20 1604   10/02/20 1515  doxycycline (VIBRA-TABS) tablet 100 mg  100 mg Oral  Once 10/02/20 1505 10/02/20 1513      PRN meds: acetaminophen **OR** acetaminophen, albuterol, hydrALAZINE, morphine injection, ondansetron (ZOFRAN) IV, oxyCODONE, polyethylene glycol   Objective: Vitals:   10/06/20 0600 10/06/20 0737  BP: (!) 126/95   Pulse: (!) 102   Resp: 18   Temp: 98.7 F (37.1 C)   SpO2: 91% 93%    Intake/Output Summary (Last 24 hours) at 10/06/2020 1314 Last data filed at 10/06/2020 0730 Gross per 24 hour  Intake 720 ml  Output 2300 ml  Net -1580 ml   Filed Weights   10/02/20 1359 10/05/20 0641 10/06/20 0600  Weight: (!) 226.2 kg (!) 219 kg (!) 219 kg   Weight change: 0 kg Body mass index is 80.34 kg/m.   Physical Exam: General exam: Morbidly obese.  Not in any distress. HEENT: No pallor or jaundice NECK : Supple.  JVD is difficult to assess. Lungs: Decrease air entry with inspiratory and expiratory wheeze.  CVS: Regular rate and rhythm, no murmur GI/Abd: Morbidly obese, soft, distended. Organs are difficult to assess. CNS: Alert, awake, oriented x3. Patient moves all extremities. Psychiatry: Mood appropriate Extremities: Chronic bilateral lymphedema with stasis changes, skin thickening.  Data Review: I have personally reviewed the laboratory data and studies available.  Recent Labs  Lab 10/02/20 1524 10/03/20 0526 10/04/20 0612 10/05/20 0533 10/06/20 0518  WBC 8.5 8.3 10.3 9.5 8.9  NEUTROABS 5.6  --   --   --   --   HGB  14.8 15.2* 14.9 15.7* 16.2*  HCT 46.7* 47.8* 45.8 49.0* 50.7*  MCV 79.7* 79.0* 78.0* 78.5* 79.2*  PLT 260 292 285 291 280   Recent Labs  Lab 10/02/20 1227 10/03/20 0526 10/04/20 0612 10/05/20 0533 10/06/20 0518  NA 137 138 137 139 138  K 4.2 4.9 4.3 3.9 3.8  CL 98 96* 95* 94* 93*  CO2 31 30 32 36* 36*  GLUCOSE 120* 176* 155* 133* 138*  BUN 9 11 18  23* 28*  CREATININE 0.93 0.78 0.83 0.92 1.01*  CALCIUM 8.6* 9.2 9.0 8.9 8.5*  MG  --   --   --   --  2.0  PHOS  --   --   --   --  5.1*    F/u labs ordered Unresulted Labs (From admission, onward)          Start     Ordered   10/07/20 0500  Renal function panel  Tomorrow morning,   R        10/06/20 1313   10/07/20 0500  Magnesium  Tomorrow morning,   R        10/06/20 1313   10/03/20 6962  Basic metabolic panel  Daily,   R      10/02/20 1557   10/03/20 0500  CBC  Daily,   R      10/02/20 1557          Signed, Bonnell Public, MD [facility] 10/06/2020

## 2020-10-06 NOTE — Progress Notes (Signed)
PT Cancellation Note  Patient Details Name: Cynthia Becker MRN: [id number] DOB: [birth year]   Cancelled Treatment:     PT attempted this am but deferred at pt request 2* pain.  Pt states had just received additional pain meds.  Will follow.  Debe Coder PT Acute Rehabilitation Services Pager [phone] Office [phone]    [facility] 10/06/2020, 12:48 PM

## 2020-10-07 DIAGNOSIS — J9601 Acute respiratory failure with hypoxia: Secondary | ICD-10-CM | POA: Diagnosis not present

## 2020-10-07 LAB — CBC
HCT: 50.5 % — ABNORMAL HIGH (ref 36.0–46.0)
Hemoglobin: 16.4 g/dL — ABNORMAL HIGH (ref 12.0–15.0)
MCH: 25.5 pg — ABNORMAL LOW (ref 26.0–34.0)
MCHC: 32.5 g/dL (ref 30.0–36.0)
MCV: 78.7 fL — ABNORMAL LOW (ref 80.0–100.0)
Platelets: 316 10*3/uL (ref 150–400)
RBC: 6.42 MIL/uL — ABNORMAL HIGH (ref 3.87–5.11)
RDW: 16.5 % — ABNORMAL HIGH (ref 11.5–15.5)
WBC: 9.5 10*3/uL (ref 4.0–10.5)
nRBC: 0 % (ref 0.0–0.2)

## 2020-10-07 LAB — RENAL FUNCTION PANEL
Albumin: 3.5 g/dL (ref 3.5–5.0)
Anion gap: 11 (ref 5–15)
BUN: 26 mg/dL — ABNORMAL HIGH (ref 6–20)
CO2: 34 mmol/L — ABNORMAL HIGH (ref 22–32)
Calcium: 9 mg/dL (ref 8.9–10.3)
Chloride: 93 mmol/L — ABNORMAL LOW (ref 98–111)
Creatinine, Ser: 0.9 mg/dL (ref 0.44–1.00)
GFR, Estimated: >60 mL/min (ref >60)
Glucose, Bld: 126 mg/dL — ABNORMAL HIGH (ref 70–99)
Phosphorus: 4.1 mg/dL (ref 2.5–4.6)
Potassium: 4.1 mmol/L (ref 3.5–5.1)
Sodium: 138 mmol/L (ref 135–145)

## 2020-10-07 LAB — BASIC METABOLIC PANEL
Anion gap: 11 (ref 5–15)
BUN: 27 mg/dL — ABNORMAL HIGH (ref 6–20)
CO2: 34 mmol/L — ABNORMAL HIGH (ref 22–32)
Calcium: 9 mg/dL (ref 8.9–10.3)
Chloride: 93 mmol/L — ABNORMAL LOW (ref 98–111)
Creatinine, Ser: 1.01 mg/dL — ABNORMAL HIGH (ref 0.44–1.00)
GFR, Estimated: >60 mL/min (ref >60)
Glucose, Bld: 129 mg/dL — ABNORMAL HIGH (ref 70–99)
Potassium: 4 mmol/L (ref 3.5–5.1)
Sodium: 138 mmol/L (ref 135–145)

## 2020-10-07 LAB — GLUCOSE, CAPILLARY
Glucose-Capillary: 183 mg/dL — ABNORMAL HIGH (ref 70–99)
Glucose-Capillary: 156 mg/dL — ABNORMAL HIGH (ref 70–99)

## 2020-10-07 LAB — MAGNESIUM: Magnesium: 1.9 mg/dL (ref 1.7–2.4)

## 2020-10-07 MED ORDER — TORSEMIDE 40 MG PO TABS: 40.0000 mg | ORAL_TABLET | Freq: Every day | ORAL | 1 refills | Status: DC

## 2020-10-07 MED ORDER — CARVEDILOL 3.125 MG PO TABS: 3.1250 mg | ORAL_TABLET | Freq: Two times a day (BID) | ORAL | 1 refills | Status: DC

## 2020-10-07 MED ORDER — POLYETHYLENE GLYCOL 3350 17 G PO PACK: 17.0000 g | PACK | Freq: Every day | ORAL | 0 refills | Status: DC | PRN

## 2020-10-07 MED ORDER — INSULIN GLARGINE 100 UNIT/ML ~~LOC~~ SOLN
20.0000 [IU] | Freq: Every day | SUBCUTANEOUS | 11 refills | Status: DC
Start: 2020-10-07 — End: 2020-12-27

## 2020-10-07 NOTE — Discharge Summary (Incomplete)
Physician Discharge Summary  Patient ID: Cynthia Becker MRN: [id number] DOB/AGE: [birth year] 50 y.o.  Admit date: 10/02/2020 Discharge date: 10/07/2020  Admission Diagnoses:  Discharge Diagnoses:  Principal Problem:   Acute respiratory failure with hypoxia [facility]) Active Problems:   Morbid obesity (HCC)   Asthma   Shortness of breath   OSA (obstructive sleep apnea)   Diabetes mellitus due to underlying condition with diabetic autonomic neuropathy, unspecified whether long term insulin use ([place])   Discharged Condition: {condition:18240}  Hospital Course: ***  Consults: {consultation:18241}  Significant Diagnostic Studies: {diagnostics:18242}  Treatments: {Tx:18249}  Discharge Exam: Blood pressure (!) 136/91, pulse 94, temperature 97.9 F (36.6 C), temperature source Oral, resp. rate 18, height 5\' 5"  (1.651 m), weight (!) 217.6 kg, last menstrual period 02/13/2014, SpO2 92 %. {physical FFMB:8466599}  Disposition: Discharge disposition: 01-Home or Self Care       Discharge Instructions    Diet - low sodium heart healthy   Complete by: As directed    Increase activity slowly   Complete by: As directed      Allergies as of 10/07/2020   No Known Allergies     Medication List    STOP taking these medications   furosemide 40 MG tablet Commonly known as: LASIX   guaifenesin 100 MG/5ML syrup Commonly known as: ROBITUSSIN   guaiFENesin-dextromethorphan 100-10 MG/5ML syrup Commonly known as: ROBITUSSIN DM   ibuprofen 400 MG tablet Commonly known as: ADVIL   ondansetron 4 MG tablet Commonly known as: ZOFRAN     TAKE these medications   Accu-Chek FastClix Lancets Misc USE TO CHECK BLOOD SUGAR TWICE DAILY   Accu-Chek Guide test strip Generic drug: glucose blood USE 1 STRIP TWICE DAILY   albuterol 108 (90 Base) MCG/ACT inhaler Commonly known as: VENTOLIN HFA Inhale 2 puffs into the lungs every 6 (six) hours as needed for wheezing or shortness of  breath. What changed: Another medication with the same name was changed. Make sure you understand how and when to take each.   albuterol (2.5 MG/3ML) 0.083% nebulizer solution Commonly known as: PROVENTIL USE 1 VIAL IN NEBULIZER EVERY 6 HOURS AS NEEDED FOR WHEEZING OR SHORTNESS OF BREATH What changed: See the new instructions.   BiDil 20-37.5 MG tablet Generic drug: isosorbide-hydrALAZINE Take 1 tablet by mouth in the morning and at bedtime.   carvedilol 3.125 MG tablet Commonly known as: COREG Take 1 tablet (3.125 mg total) by mouth 2 (two) times daily with a meal.   cetirizine 10 MG tablet Commonly known as: ZyrTEC Allergy Take 1 tablet (10 mg total) by mouth daily.   cyclobenzaprine 5 MG tablet Commonly known as: FLEXERIL Take 1 tablet (5 mg total) by mouth 3 (three) times daily as needed for muscle spasms.   Fluticasone-Salmeterol 250-50 MCG/DOSE Aepb Commonly known as: Advair Diskus Inhale 1 puff into the lungs 2 (two) times daily.   gabapentin 600 MG tablet Commonly known as: NEURONTIN TAKE 1 TABLET BY MOUTH THREE TIMES DAILY   glipiZIDE 5 MG tablet Commonly known as: GLUCOTROL Take 1 tablet (5 mg total) by mouth 2 (two) times daily before a meal.   insulin glargine 100 UNIT/ML injection Commonly known as: LANTUS Inject 0.2 mLs (20 Units total) into the skin at bedtime.   linaclotide 290 MCG Caps capsule Commonly known as: Linzess Take 1 capsule (290 mcg total) by mouth daily before breakfast.   montelukast 10 MG tablet Commonly known as: SINGULAIR Take 1 tablet (10 mg total) by mouth at bedtime.  omeprazole 20 MG capsule Commonly known as: PRILOSEC Take 20 mg by mouth at bedtime.   Oxycodone HCl 10 MG Tabs Take 10 mg by mouth every 6 (six) hours as needed (pain). Taking 3-4 times a day   polyethylene glycol 17 g packet Commonly known as: MIRALAX / GLYCOLAX Take 17 g by mouth daily as needed for mild constipation.   QUEtiapine 400 MG tablet Commonly  known as: SEROQUEL Take 400 mg by mouth at bedtime.   spironolactone 50 MG tablet Commonly known as: ALDACTONE Take 1 tablet (50 mg total) by mouth daily.   Torsemide 40 MG Tabs Take 40 mg by mouth daily. Start taking on: October 08, 2020        Signed: Bonnell Public 10/07/2020, 1:28 PM

## 2020-10-26 ENCOUNTER — Telehealth (INDEPENDENT_AMBULATORY_CARE_PROVIDER_SITE_OTHER): Payer: Medicaid Other | Admitting: Nurse Practitioner

## 2020-10-26 ENCOUNTER — Encounter: Payer: Self-pay | Admitting: Nurse Practitioner

## 2020-10-26 VITALS — Ht 65.0 in | Wt >= 6400 oz

## 2020-10-26 DIAGNOSIS — I10 Essential (primary) hypertension: Secondary | ICD-10-CM | POA: Diagnosis not present

## 2020-10-26 DIAGNOSIS — R601 Generalized edema: Secondary | ICD-10-CM

## 2020-10-26 DIAGNOSIS — M5126 Other intervertebral disc displacement, lumbar region: Secondary | ICD-10-CM

## 2020-10-26 DIAGNOSIS — E119 Type 2 diabetes mellitus without complications: Secondary | ICD-10-CM

## 2020-10-26 DIAGNOSIS — M79604 Pain in right leg: Secondary | ICD-10-CM

## 2020-10-26 MED ORDER — GABAPENTIN 600 MG PO TABS: 600.0000 mg | ORAL_TABLET | Freq: Three times a day (TID) | ORAL | 0 refills | Status: DC

## 2020-10-26 MED ORDER — GLIPIZIDE 10 MG PO TABS: 10.0000 mg | ORAL_TABLET | Freq: Two times a day (BID) | ORAL | 3 refills | Status: DC

## 2020-10-26 NOTE — Progress Notes (Signed)
   Cynthia Becker, Cynthia Becker  [id number] Phone:  [phone]   Fax:  [phone] Virtual Visit via Telephone Note  I connected with Cynthia Becker on 10/26/20 at  3:20 PM EDT by telephone and verified that I am speaking with the correct person using two identifiers.   I discussed the limitations, risks, security and privacy concerns of performing an evaluation and management service by telephone and the availability of in person appointments. I also discussed with the patient that there may be a patient responsible charge related to this service. The patient expressed understanding and agreed to proceed.  Patient home Provider Office  History of Present Illness: Patient is in today for hospital follow-up. Recently admitted for PNX. Hospital course of treatment was from 10/02/20 to 10/07/20.  During hospital course she was getting IV lasix and her weight went down 20 pounds. She is doing daily weights. She is taking Torsemide 40 mg daily. She reports that she has gained 15 pounds over the last 3 weeks.   She feels like her blood glucose are elevated. She is taking glipizide 5 mg bid and Lantus 20 units .   Observations/Objective: Virtual visit no exam  BP 174/82 CBG 210 Oxygen sats 92% RA  Assessment and Plan:   Assessment  Primary Diagnosis & Pertinent Problem List: The primary encounter diagnosis was Essential hypertension. Diagnoses of Type 2 diabetes mellitus without complication, unspecified whether long term insulin use (HCC) and Generalized edema were also pertinent to this visit.  Visit Diagnosis: 1. Essential hypertension  Persistent on current regimen  Follow up with cardiology as scheduled  2. Type 2 diabetes mellitus without complication, unspecified whether long term insulin use (HCC)  Worsening  Increased Glipizide 10 mg BID   3. Generalized edema Increased torsemide 20 mg three tabs for Friday, Saturday and Sunday  Urgent referral  to HF clinic      Follow Up Instructions:    I discussed the assessment and treatment plan with the patient. The patient was provided an opportunity to ask questions and all were answered. The patient agreed with the plan and demonstrated an understanding of the instructions.   The patient was advised to call back or seek an in-person evaluation if the symptoms worsen or if the condition fails to improve as anticipated.  I provided 30 minutes of telephone- face-to-face time during this encounter.   Cynthia Francois, NP

## 2020-10-26 NOTE — Patient Instructions (Signed)
Heart Failure Eating Plan Heart failure, also called congestive heart failure, occurs when your heart does not pump blood well enough to meet your body's needs for oxygen-rich blood. Heart failure is a long-term (chronic) condition. Living with heart failure can be challenging. Following your health care provider's instructions about a healthy lifestyle and working with a dietitian to choose the right foods may help to improve your symptoms. An eating plan for someone with heart failure will include changes that limit the intake of salt (sodium) and unhealthy fat. What are tips for following this plan? Reading food labels  Check food labels for the amount of sodium per serving. Choose foods that have less than 140 mg (milligrams) of sodium in each serving.  Check food labels for the number of calories per serving. This is important if you need to limit your daily calorie intake to lose weight.  Check food labels for the serving size. If you eat more than one serving, you will be eating more sodium and calories than what is listed on the label.  Look for foods that are labeled as "sodium-free," "very low sodium," or "low sodium." ? Foods labeled as "reduced sodium" or "lightly salted" may still have more sodium than what is recommended for you. Cooking  Avoid adding salt when cooking. Ask your health care provider or dietitian before using salt substitutes.  Season food with salt-free seasonings, spices, or herbs. Check the label of seasoning mixes to make sure they do not contain salt.  Cook with heart-healthy oils, such as olive, canola, soybean, or sunflower oil.  Do not fry foods. Cook foods using low-fat methods, such as baking, boiling, grilling, and broiling.  Limit unhealthy fats when cooking by: ? Removing the skin from poultry, such as chicken. ? Removing all visible fats from meats. ? Skimming the fat off from stews, soups, and gravies before serving them. Meal planning  Limit  your intake of: ? Processed, canned, or prepackaged foods. ? Foods that are high in trans fat, such as fried foods. ? Sweets, desserts, sugary drinks, and other foods with added sugar. ? Full-fat dairy products, such as whole milk.  Eat a balanced diet. This may include: ? 4-5 servings of fruit each day and 4-5 servings of vegetables each day. At each meal, try to fill one-half of your plate with fruits and vegetables. ? Up to 6-8 servings of whole grains each day. ? Up to 2 servings of lean meat, poultry, or fish each day. One serving of meat is equal to 3 oz (85 g). This is about the same size as a deck of cards. ? 2 servings of low-fat dairy each day. ? Heart-healthy fats. Healthy fats called omega-3 fatty acids are found in foods such as flaxseed and cold-water fish like sardines, salmon, and mackerel.  Aim to eat 25-35 g (grams) of fiber a day. Foods that are high in fiber include apples, broccoli, carrots, beans, peas, and whole grains.  Do not add salt or condiments that contain salt (such as soy sauce) to foods before eating.  When eating at a restaurant, ask that your food be prepared with less salt or no salt, if possible.  Try to eat 2 or more vegetarian meals each week.  Eat more home-cooked food and eat less restaurant, buffet, and fast food.   General information  Do not eat more than 2,300 mg of sodium a day. The amount of sodium that is recommended for you may be lower, depending on your  condition.  Maintain a healthy body weight as directed. Ask your health care provider what a healthy weight is for you. ? Check your weight every day. ? Work with your health care provider and dietitian to make a plan that is right for you to lose weight or maintain your current weight.  Limit how much fluid you drink. Ask your health care provider or dietitian how much fluid you can have each day.  Limit or avoid alcohol as told by your health care provider or dietitian. Recommended  foods Fruits All fresh, frozen, and canned fruits. Dried fruits, such as raisins, prunes, and cranberries. Vegetables All fresh vegetables. Vegetables that are frozen without sauce or added salt. Low-sodium or sodium-free canned vegetables. Grains Bread with less than 80 mg of sodium per slice. Whole-wheat pasta, quinoa, and brown rice. Oats and oatmeal. Barley. Millet. Grits and cream of wheat. Whole-grain and whole-wheat cold cereal. Meats and other protein foods Lean cuts of meat. Skinless chicken and turkey. Fish with high omega-3 fatty acids, such as salmon, sardines, and other cold-water fishes. Eggs. Dried beans, peas, and edamame. Unsalted nuts and nut butters. Dairy Low-fat or nonfat (skim) milk and dried milk. Rice milk, soy milk, and almond milk. Low-fat or nonfat yogurt. Small amounts of reduced-sodium block cheese. Low-sodium cottage cheese. Fats and oils Olive, canola, soybean, flaxseed, avocado, or sunflower oil. Sweets and desserts Applesauce. Granola bars. Sugar-free pudding and gelatin. Frozen fruit bars. Seasoning and other foods Fresh and dried herbs. Lemon or lime juice. Vinegar. Low-sodium ketchup. Salt-free marinades, salad dressings, sauces, and seasonings. The items listed above may not be a complete list of foods and beverages you can eat. Contact a dietitian for more information. Foods to avoid Fruits Fruits that are dried with sodium-containing preservatives. Vegetables Canned vegetables. Frozen vegetables with sauce or seasonings. Creamed vegetables. French fries. Onion rings. Pickled vegetables and sauerkraut. Grains Bread with more than 80 mg of sodium per slice. Hot or cold cereal with more than 140 mg sodium per serving. Salted pretzels and crackers. Prepackaged breadcrumbs. Bagels, croissants, and biscuits. Meats and other protein foods Ribs and chicken wings. Bacon, ham, pepperoni, bologna, salami, and packaged luncheon meats. Hot dogs, bratwurst, and  sausage. Canned meat. Smoked meat and fish. Salted nuts and seeds. Dairy Whole milk, half-and-half, and cream. Buttermilk. Processed cheese, cheese spreads, and cheese curds. Regular cottage cheese. Feta cheese. Shredded cheese. String cheese. Fats and oils Butter, lard, shortening, ghee, and bacon fat. Canned and packaged gravies. Seasoning and other foods Onion salt, garlic salt, table salt, and sea salt. Marinades. Regular salad dressings. Relishes, pickles, and olives. Meat flavorings and tenderizers, and bouillon cubes. Horseradish, ketchup, and mustard. Worcestershire sauce. Teriyaki sauce, soy sauce (including reduced sodium). Hot sauce and Tabasco sauce. Steak sauce, fish sauce, oyster sauce, and cocktail sauce. Taco seasonings. Barbecue sauce. Tartar sauce. The items listed above may not be a complete list of foods and beverages you should avoid. Contact a dietitian for more information. Summary  A heart failure eating plan includes changes that limit your intake of sodium and unhealthy fat, and it may help you lose weight or maintain a healthy weight. Your health care provider may also recommend limiting how much fluid you drink.  Most people with heart failure should eat no more than 2,300 mg of salt (sodium) a day. The amount of sodium that is recommended for you may be lower, depending on your condition.  Contact your health care provider or dietitian before making any major   changes to your diet. This information is not intended to replace advice given to you by your health care provider. Make sure you discuss any questions you have with your health care provider. Document Revised: 01/24/2020 Document Reviewed: 01/24/2020 Elsevier Patient Education  2021 [place]. Heart Failure, Diagnosis  Heart failure is a condition in which the heart has trouble pumping blood. This may mean that the heart cannot pump enough blood out to the body or that the heart does not fill up with enough  blood. For some people with heart failure, fluid may back up into the lungs. There may also be swelling (edema) in the lower legs. Heart failure is usually a long-term (chronic) condition. It is important for you to take good care of yourself and follow the treatment plan from your health care provider. What are the causes? This condition may be caused by:  High blood pressure (hypertension). Hypertension causes the heart muscle to work harder than normal.  Coronary artery disease, or CAD. CAD is the buildup of cholesterol and fat (plaque) in the arteries of the heart.  Heart attack, also called myocardial infarction. This injures the heart muscle, making it hard for the heart to pump blood.  Abnormal heart valves. The valves do not open and close properly, forcing the heart to pump harder to keep the blood flowing.  Heart muscle disease, inflammation, or infection (cardiomyopathy or myocarditis). This is damage to the heart muscle. It can increase the risk of heart failure.  Lung disease. The heart works harder when the lungs are not healthy. What increases the risk? The risk of heart failure increases as a person ages. This condition is also more likely to develop in people who:  Are obese.  Are female.  Use tobacco or nicotine products.  Abuse alcohol or drugs.  Have taken medicines that can damage the heart, such as chemotherapy drugs.  Have any of these conditions: ? Diabetes. ? Abnormal heart rhythms. ? Thyroid problems. ? Low blood counts (anemia). ? Chronic kidney disease.  Have a family history of heart failure. What are the signs or symptoms? Symptoms of this condition include:  Shortness of breath with activity, such as when climbing stairs.  A cough that does not go away.  Swelling of the feet, ankles, legs, or abdomen.  Losing or gaining weight for no reason.  Trouble breathing when lying flat.  Waking from sleep because of the need to sit up and get more  air.  Rapid heartbeat.  Tiredness (fatigue) and loss of energy.  Feeling light-headed, dizzy, or close to fainting.  Nausea or loss of appetite.  Waking up more often during the night to urinate (nocturia).  Confusion. How is this diagnosed? This condition is diagnosed based on:  Your medical history, symptoms, and a physical exam.  Diagnostic tests, which may include: ? Echocardiogram. ? Electrocardiogram (ECG). ? Chest X-ray. ? Blood tests. ? Exercise stress test. ? Cardiac MRI. ? Cardiac catheterization and angiogram. ? Radionuclide scans. How is this treated? Treatment for this condition is aimed at managing the symptoms of heart failure. Medicines Treatment may include medicines that:  Help lower blood pressure by relaxing (dilating) the blood vessels. These medicines are called ACE inhibitors (angiotensin-converting enzyme), ARBs (angiotensin receptor blockers), or vasodilators.  Cause the kidneys to remove salt and water from the blood through urination (diuretics).  Improve heart muscle strength and prevent the heart from beating too fast (beta blockers).  Increase the force of the heartbeat (digoxin).  Lower heart rates. Certain diabetes medicines (SGLT-2 inhibitors) may also be used in treatment. Healthy behavior changes Treatment may also include making healthy lifestyle changes, such as:  Reaching and staying at a healthy weight.  Not using tobacco or nicotine products.  Eating heart-healthy foods.  Limiting or avoiding alcohol.  Stopping the use of illegal drugs.  Being physically active.  Participating in a cardiac rehabilitation program, which is a treatment program to improve your health and well-being through exercise training, education, and counseling. Other treatments Other treatments may include:  Procedures to open blocked arteries or repair damaged valves.  Placing a pacemaker to improve heart function (cardiac resynchronization  therapy).  Placing a device to treat serious abnormal heart rhythms (implantable cardioverter defibrillator, or ICD).  Placing a device to improve the pumping ability of the heart (left ventricular assist device, or LVAD).  Receiving a healthy heart from a donor (heart transplant). This is done when other treatments have not helped. Follow these instructions at home:  Manage other health conditions as told by your health care provider. These may include hypertension, diabetes, thyroid disease, or abnormal heart rhythms.  Get ongoing education and support as needed. Learn as much as you can about heart failure.  Keep all follow-up visits. This is important. Summary  Heart failure is a condition in which the heart has trouble pumping blood.  This condition is commonly caused by high blood pressure and other diseases of the heart and lungs.  Symptoms of this condition include shortness of breath, tiredness (fatigue), nausea, and swelling of the feet, ankles, legs, or abdomen.  Treatments for this condition may include medicines, lifestyle changes, and surgery.  Manage other health conditions as told by your health care provider. This information is not intended to replace advice given to you by your health care provider. Make sure you discuss any questions you have with your health care provider. Document Revised: 01/01/2020 Document Reviewed: 01/01/2020 Elsevier Patient Education  [place].

## 2020-11-13 NOTE — Discharge Summary (Signed)
Physician Discharge Summary  Patient ID: Cynthia Becker MRN: [id number] DOB/AGE: 12-14-1970 50 y.o.  Admit date: 10/02/2020 Discharge date: 10/07/2020  Admission Diagnoses:  Discharge Diagnoses:  -Acute hypoxic respiratory failure. -Acute on chronic diastolic congestive heart failure. -Essential hypertension. -Pulmonary hypertension. -Diabetes mellitus type 2 with neuropathy. -Hyperlipidemia. -Morbid obesity.   Discharged Condition: stable  Hospital Course: Patient is a 50 year old female with past medical history significant for morbid obesity, diabetes mellitus type 2, hypertension, hyperlipidemia, diastolic congestive heart failure and pulmonary hypertension.  Patient was admitted with progressive worsening shortness of breath and was refractory to inhalers and 20 pound weight gain in 2 months.  On presentation, blood pressure was elevated, cardiac BNP was normal, troponin was less than 2 and a D-dimer was slightly elevated with negative COVID PCR.  CT angio of chest revealed possible pneumonia in the left lower lobe, areas of mild adenopathy.  Patient was admitted for further assessment and management.  Patient was aggressively diuresed.  Cumulatively, patient was 8.9 L fluid negative.  With diuresis, patient's symptoms improved.    Acute hypoxic respiratory failure -Likely secondary to acute on chronic diastolic congestive heart failure exacerbation. -Patient has been adequately diuresed. -Patient will be discharged on oral diuretics. -Continue home oxygen on discharge.  Acute exacerbation of diastolic CHF Essential hypertension -See above documentation. -Presented with progressive shortness of breath, 20 pound weight gain in 60-month.   -Echocardiogram done on 10/03/2020 revealed EF of 60 to 65%. -Patient was initially managed with IV diuretics and later transitioned to oral diuretics.  -Continue BiDil and Aldactone. Last Labs          Recent Labs  Lab 10/02/20 1227  10/03/20 0526 10/04/20 0612 10/05/20 0533 10/06/20 0518  BNP 17.9  --   --   --   --   BUN 9 11 18  23* 28*  CREATININE 0.93 0.78 0.83 0.92 1.01*  K 4.2 4.9 4.3 3.9 3.8  MG  --   --   --   --  2.0     Abnormal CT chest -CT scan of chest suggested left lower lobe pneumonia.  However with no fever, normal WBC count, normal procalcitonin.  I do not think patient has an infectious process going on.  Antibiotics stopped on 4/12. Last Labs           Recent Labs  Lab 10/02/20 1443 10/02/20 1524 10/03/20 0526 10/04/20 0612 10/05/20 0533 10/06/20 0518  WBC  --  8.5 8.3 10.3 9.5 8.9  PROCALCITON <0.10  --   --   --   --   --      History of asthma, pulmonary hypertension No history of OSA per patient -Continued albuterol, Advair Diskus, Robitussin, Singulair 10 mg daily -Patient stated that she had sleep study done 3 times in the last few years and all of them were negative.  Type 2 diabetes with neuropathy -Hemoglobin A1c done on 10/02/2020 was 7.2%.   -Patient was on on glipizide 5 mg twice daily at home.  -Patient was also on Monday with sliding scale insulin  -Continue Neurontin. Last Labs          Recent Labs  Lab 10/05/20 1229 10/05/20 1739 10/05/20 2134 10/06/20 0737 10/06/20 1159  GLUCAP 228* 184* 180* 146* 117*     Hyperlipidemia -Not on meds at home  Morbid obesity  -Body mass index is 80.34 kg/m.  -Diet and exercise.  Constipation -Linzess 290 mcg daily,  GERD -Prilosec 20 mg at bedtime  Impaired  mobility -Patient discharged she has difficulty with ambulation because of her weight, back pain. She just got wheelchair delivered at her house. -PT evaluation obtained.  Consults: None  Significant Diagnostic Studies:  Chest x-ray revealed: Stable mild central pulmonary vascular congestion.  CT angio chest revealed: 1. No appreciable thoracic adenopathy. No aortic aneurysm or dissection.  2. Enlargement of the main pulmonary outflow  tract is felt to be indicative of pulmonary arterial hypertension.  3. Apparent pneumonia in the posterior segment left lower lobe. Scattered areas of atelectasis and scattered areas of mosaic attenuation which may represent underlying small airways obstructive disease. No pleural effusions.  4. Areas of mild adenopathy which may have reactive etiology given the underlying parenchymal lung changes.  Echocardiogram revealed: Left Ventricle: Left ventricular ejection fraction, by estimation, is 60  to 65%. The left ventricle has normal function. The left ventricle has no  regional wall motion abnormalities. Definity contrast agent was given IV  to delineate the left ventricular  endocardial borders. The left ventricular internal cavity size was small.  There is no left ventricular hypertrophy. Left ventricular diastolic  parameters were normal.   Right Ventricle: The right ventricular size is not well visualized. Right  vetricular wall thickness was not well visualized. Right ventricular  systolic function was not well visualized.   Left Atrium: Left atrial size was normal in size.   Right Atrium: Right atrial size was normal in size.   Pericardium: There is no evidence of pericardial effusion.   Mitral Valve: The mitral valve is normal in structure. No evidence of  mitral valve regurgitation.   Tricuspid Valve: The tricuspid valve is not well visualized. Tricuspid  valve regurgitation is not demonstrated.   Aortic Valve: The aortic valve is normal in structure. Aortic valve  regurgitation is not visualized.   Pulmonic Valve: The pulmonic valve was not well visualized. Pulmonic valve  regurgitation is not visualized.   Aorta: The aortic root and ascending aorta are structurally normal, with  no evidence of dilitation.   IAS/Shunts: The atrial septum is grossly normal.   Additional Comments: Technically very difficult echo due to poor image  quality.   Discharge  Exam: Blood pressure (!) 136/98, pulse 97, temperature 97.9 F (36.6 C), temperature source Oral, resp. rate 20, height 5\' 5"  (1.651 m), weight (!) 218.6 kg, last menstrual period 02/13/2014, SpO2 93 %.  Disposition: Discharge disposition: 01-Home or Self Care   Discharge Instructions    Diet - low sodium heart healthy   Complete by: As directed    Increase activity slowly   Complete by: As directed      Allergies as of 10/07/2020   No Known Allergies     Medication List    STOP taking these medications   furosemide 40 MG tablet Commonly known as: LASIX   guaifenesin 100 MG/5ML syrup Commonly known as: ROBITUSSIN   guaiFENesin-dextromethorphan 100-10 MG/5ML syrup Commonly known as: ROBITUSSIN DM   ibuprofen 400 MG tablet Commonly known as: ADVIL   ondansetron 4 MG tablet Commonly known as: ZOFRAN     TAKE these medications   Accu-Chek FastClix Lancets Misc USE TO CHECK BLOOD SUGAR TWICE DAILY   Accu-Chek Guide test strip Generic drug: glucose blood USE 1 STRIP TWICE DAILY   albuterol 108 (90 Base) MCG/ACT inhaler Commonly known as: VENTOLIN HFA Inhale 2 puffs into the lungs every 6 (six) hours as needed for wheezing or shortness of breath. What changed: Another medication with the same name was changed.  Make sure you understand how and when to take each.   albuterol (2.5 MG/3ML) 0.083% nebulizer solution Commonly known as: PROVENTIL USE 1 VIAL IN NEBULIZER EVERY 6 HOURS AS NEEDED FOR WHEEZING OR SHORTNESS OF BREATH What changed: See the new instructions.   BiDil 20-37.5 MG tablet Generic drug: isosorbide-hydrALAZINE Take 1 tablet by mouth in the morning and at bedtime.   carvedilol 3.125 MG tablet Commonly known as: COREG Take 1 tablet (3.125 mg total) by mouth 2 (two) times daily with a meal.   cetirizine 10 MG tablet Commonly known as: ZyrTEC Allergy Take 1 tablet (10 mg total) by mouth daily.   Fluticasone-Salmeterol 250-50 MCG/DOSE Aepb Commonly  known as: Advair Diskus Inhale 1 puff into the lungs 2 (two) times daily.   insulin glargine 100 UNIT/ML injection Commonly known as: LANTUS Inject 0.2 mLs (20 Units total) into the skin at bedtime.   linaclotide 290 MCG Caps capsule Commonly known as: Linzess Take 1 capsule (290 mcg total) by mouth daily before breakfast.   montelukast 10 MG tablet Commonly known as: SINGULAIR Take 1 tablet (10 mg total) by mouth at bedtime.   omeprazole 20 MG capsule Commonly known as: PRILOSEC Take 20 mg by mouth at bedtime.   Oxycodone HCl 10 MG Tabs Take 10 mg by mouth every 6 (six) hours as needed (pain). Taking 3-4 times a day   QUEtiapine 400 MG tablet Commonly known as: SEROQUEL Take 400 mg by mouth at bedtime.   spironolactone 50 MG tablet Commonly known as: ALDACTONE Take 1 tablet (50 mg total) by mouth daily.   Torsemide 40 MG Tabs Take 40 mg by mouth daily.     ASK your doctor about these medications   cyclobenzaprine 5 MG tablet Commonly known as: FLEXERIL Take 1 tablet (5 mg total) by mouth 3 (three) times daily as needed for muscle spasms. Ask about: Should I take this medication?        SignedBonnell Public 11/13/2020, 4:53 PM

## 2020-11-24 ENCOUNTER — Telehealth: Payer: Self-pay | Admitting: Nurse Practitioner

## 2020-11-24 NOTE — Telephone Encounter (Signed)
Patient called and said she think she need a heart doctor.

## 2020-11-26 ENCOUNTER — Emergency Department (HOSPITAL_COMMUNITY)
Admission: EM | Admit: 2020-11-26 | Discharge: 2020-11-26 | Disposition: A | Discharge status: Home / Self Care | Payer: Medicaid Other | Attending: Emergency Medicine | Admitting: Emergency Medicine

## 2020-11-26 ENCOUNTER — Other Ambulatory Visit: Payer: Self-pay

## 2020-11-26 DIAGNOSIS — I110 Hypertensive heart disease with heart failure: Secondary | ICD-10-CM | POA: Diagnosis not present

## 2020-11-26 DIAGNOSIS — Z7984 Long term (current) use of oral hypoglycemic drugs: Secondary | ICD-10-CM | POA: Insufficient documentation

## 2020-11-26 DIAGNOSIS — Z794 Long term (current) use of insulin: Secondary | ICD-10-CM | POA: Insufficient documentation

## 2020-11-26 DIAGNOSIS — E119 Type 2 diabetes mellitus without complications: Secondary | ICD-10-CM | POA: Diagnosis not present

## 2020-11-26 DIAGNOSIS — I5033 Acute on chronic diastolic (congestive) heart failure: Secondary | ICD-10-CM | POA: Insufficient documentation

## 2020-11-26 DIAGNOSIS — T43501A Poisoning by unspecified antipsychotics and neuroleptics, accidental (unintentional), initial encounter: Secondary | ICD-10-CM | POA: Insufficient documentation

## 2020-11-26 DIAGNOSIS — Z7951 Long term (current) use of inhaled steroids: Secondary | ICD-10-CM | POA: Diagnosis not present

## 2020-11-26 DIAGNOSIS — Z87891 Personal history of nicotine dependence: Secondary | ICD-10-CM | POA: Diagnosis not present

## 2020-11-26 DIAGNOSIS — Z79899 Other long term (current) drug therapy: Secondary | ICD-10-CM | POA: Insufficient documentation

## 2020-11-26 DIAGNOSIS — T50901A Poisoning by unspecified drugs, medicaments and biological substances, accidental (unintentional), initial encounter: Secondary | ICD-10-CM

## 2020-11-26 DIAGNOSIS — J45909 Unspecified asthma, uncomplicated: Secondary | ICD-10-CM | POA: Insufficient documentation

## 2020-11-26 NOTE — ED Provider Notes (Signed)
[facility] DEPT Provider Note   CSN: [id number] Arrival date & time: 11/26/20  0400     History Chief Complaint  Patient presents with  . Drug Overdose    Cynthia Becker is a 50 y.o. female.  Patient presents to the emergency department for evaluation of accidental overdose.  Patient reports that she took her scheduled 400 mg of Seroquel at 10 PM.  At 11:30 PM she accidentally took a second 1, thinking that it was ibuprofen.  She has become nervous that this might be a harmful overdose so she came in to be checked.        Past Medical History:  Diagnosis Date  . Anemia    iv iron treatment dec 2014/ see oncology for this last in dec 2014  . Anxiety   . Arthritis    knee, hands  . Asthma    seasonal related/ albuterol used when uri  . CHF (congestive heart failure) ([place])   . CN (constipation) 09/14/2014  . [place] DISEASE, LUMBOSACRAL SPINE W/RADICULOPATHY 11/01/2009   Qualifier: Diagnosis of  By: Jorene Minors, [place]    . Depression   . Diabetes ([place])   . H/O dizziness    Hx  . Headache(784.0)    otc med prn  . High blood pressure   . HYPERLIPIDEMIA 02/13/2010   Qualifier: Diagnosis of  By: Jorene Minors, Scott    . INSOMNIA 08/24/2008   Qualifier: Diagnosis of  By: Radene Ou MD, [place]    . Morbid obesity ([place]) 05/24/2008   Qualifier: Diagnosis of  By: Radene Ou MD, [place]    . SVD (spontaneous vaginal delivery)    x 3    Patient Active Problem List   Diagnosis Date Noted  . Acute respiratory failure with hypoxia ([place]) 10/02/2020  . Acute on chronic diastolic CHF (congestive heart failure) ([place]) 04/29/2020  . Essential hypertension 04/25/2020  . Community acquired bacterial pneumonia 04/25/2020  . Class 3 obesity 04/25/2020  . Diabetes mellitus due to underlying condition with diabetic autonomic neuropathy, unspecified whether long term insulin use ([place]) 04/24/2020  . Acute respiratory failure ([place]) 04/24/2020  . Asthmatic  bronchitis 03/21/2020  . Nocturnal hypoxia 03/21/2020  . OSA (obstructive sleep apnea) 12/22/2019  . Shortness of breath 08/09/2019  . No-show for appointment 06/30/2019  . Morbid obesity with BMI of 60.0-69.9, adult ([place]) 09/14/2014  . Iron deficiency anemia due to chronic blood loss 09/14/2014  . CN (constipation) 09/14/2014  . Postoperative state 04/12/2014  . Uterine fibroid 03/17/2014  . DUB (dysfunctional uterine bleeding) 01/27/2014  . Iron deficiency anemia due to chronic blood loss 04/09/2013  . 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 Menometrorrhagia 04/09/2013  . Left hand pain 09/18/2011  . High blood pressure   . ANEMIA, IRON DEFICIENCY 08/06/2010  . CHOLECYSTECTOMY, HX OF 04/09/2010  . Dyslipidemia 02/13/2010  . DEGENERATIVE DISC DISEASE, LUMBOSACRAL SPINE W/RADICULOPATHY 11/01/2009  . BACK PAIN, LUMBAR, WITH RADICULOPATHY 10/30/2009  . TRIGGER FINGER 02/03/2009  . Carpal tunnel syndrome 12/21/2008  . Headache(784.0) 11/09/2008  . Depression 10/04/2008  . URI 08/24/2008  . INSOMNIA 08/24/2008  . Asthma 06/06/2008  . Morbid obesity ([place]) 05/24/2008  . ESSENTIAL HYPERTENSION 05/24/2008    Past Surgical History:  Procedure Laterality Date  . ABDOMINAL HYSTERECTOMY  03/13/2014   total   . BILATERAL SALPINGECTOMY Bilateral 04/12/2014   Procedure: BILATERAL SALPINGECTOMY;  Surgeon: Woodroe Mode, MD;  Location: [facility];  Service: Gynecology;  Laterality: Bilateral;  . CARPAL TUNNEL RELEASE  08/30/2011   Procedure: CARPAL TUNNEL RELEASE;  Surgeon: Wynonia Sours, MD;  Location: [facility];  Service: Orthopedics;  Laterality: Right;  . CHOLECYSTECTOMY  10/11   lap choli  . GALLBLADDER SURGERY  04/04/2010  . HYSTEROSCOPY N/A 02/08/2014   Procedure: HYSTEROSCOPY;  Surgeon: Woodroe Mode, MD;  Location: [facility];  Service: Gynecology;  Laterality: N/A;  . HYSTEROSCOPY WITH NOVASURE N/A 02/08/2014   Procedure: ATTEMPTED NOVASURE ABLATION;  Surgeon: Woodroe Mode, MD;  Location: [facility];  Service: Gynecology;  Laterality: N/A;  . INTRAUTERINE DEVICE INSERTION     07/19/2013  . SEPTOPLASTY N/A 09/08/2013   Procedure: SEPTOPLASTY;  Surgeon: Ruby Cola, MD;  Location: [facility];  Service: ENT;  Laterality: N/A;  . SPINE SURGERY    . TONSILLECTOMY    . TONSILLECTOMY AND ADENOIDECTOMY Bilateral 09/08/2013   Procedure: TONSILLECTOMY ;  Surgeon: Ruby Cola, MD;  Location: [facility];  Service: ENT;  Laterality: Bilateral;  . TUBAL LIGATION    . VAGINAL HYSTERECTOMY N/A 04/12/2014   Procedure: LAPAROSCOPIC ASSISTED VAGINAL HYSTERECTOMY;  Surgeon: Woodroe Mode, MD;  Location: [facility];  Service: Gynecology;  Laterality: N/A;  . WISDOM TOOTH EXTRACTION       OB History    Gravida  3   Para  3   Term  3   Preterm  0   AB  0   Living  3     SAB  0   IAB  0   Ectopic  0   Multiple  0   Live Births              Family History  Problem Relation Age of Onset  . Diabetes Mother   . Hypertension Father   . Heart disease Other   . Diabetes Other   . Hypertension Other   . Alcohol abuse Other   . Hypertension Brother   . Asthma Brother   . Hypertension Brother   . Asthma Brother   . COPD Maternal Grandmother   . Lung cancer Maternal Grandmother   . Asthma Paternal Grandmother   . Asthma Paternal Aunt     Social History   Tobacco Use  . Smoking status: Former Smoker    Packs/day: 0.25    Types: Cigarettes    Quit date: 04/24/2020    Years since quitting: 0.5  . Smokeless tobacco: Never Used  . Tobacco comment: 4-5 cigarettes a day  Vaping Use  . Vaping Use: Never used  Substance Use Topics  . Alcohol use: No  . Drug use: No    Home Medications Prior to Admission medications   Medication Sig Start Date End Date Taking? Authorizing Provider  ACCU-CHEK FASTCLIX LANCETS MISC USE TO CHECK BLOOD SUGAR TWICE DAILY 03/23/18   [provider]  ACCU-CHEK GUIDE test strip USE 1 [place] 03/30/18   [provider]  albuterol (PROVENTIL) (2.5 MG/3ML) 0.083% nebulizer solution USE 1 VIAL IN NEBULIZER EVERY 6 HOURS AS NEEDED FOR WHEEZING OR SHORTNESS OF BREATH Patient taking differently: Take 2.5 mg by nebulization every 6 (six) hours as needed for wheezing or shortness of breath. 03/24/20   [name], NP  albuterol (VENTOLIN HFA) 108 (90 Base) MCG/ACT inhaler Inhale 2 puffs into the lungs every 6 (six) hours as needed for wheezing or shortness of breath.    [provider]  carvedilol (COREG) 3.125 MG tablet Take 1 tablet (3.125 mg total) by mouth 2 (two)  times daily with a meal. 10/07/20 12/06/20  Bonnell Public, MD  cetirizine (ZYRTEC ALLERGY) 10 MG tablet Take 1 tablet (10 mg total) by mouth daily. 12/02/19   Julian Hy, DO  Fluticasone-Salmeterol (ADVAIR DISKUS) 250-50 MCG/DOSE AEPB Inhale 1 puff into the lungs 2 (two) times daily. 12/02/19   Julian Hy, DO  gabapentin (NEURONTIN) 600 MG tablet Take 1 tablet (600 mg total) by mouth 3 (three) times daily. 10/26/20   [name], NP  glipiZIDE (GLUCOTROL) 10 MG tablet Take 1 tablet (10 mg total) by mouth 2 (two) times daily before a meal. 10/26/20   [name], NP  insulin glargine (LANTUS) 100 UNIT/ML injection Inject 0.2 mLs (20 Units total) into the skin at bedtime. 10/07/20   Bonnell Public, MD  isosorbide-hydrALAZINE (BIDIL) 20-37.5 MG tablet Take 1 tablet by mouth in the morning and at bedtime. 01/14/20   Patwardhan, Reynold Bowen, MD  linaclotide Rolan Lipa) 290 MCG CAPS capsule Take 1 capsule (290 mcg total) by mouth daily before breakfast. 01/17/20 01/16/21  [name], NP  montelukast (SINGULAIR) 10 MG tablet Take 1 tablet (10 mg total) by mouth at bedtime. 12/02/19   Julian Hy, DO  omeprazole (PRILOSEC) 20 MG capsule Take 20 mg by mouth at bedtime.  04/01/20   [provider]  Oxycodone HCl 10 MG TABS Take 10 mg by mouth every 6 (six) hours as needed (pain). Taking 3-4 times a day    [provider]  QUEtiapine (SEROQUEL) 400 MG tablet Take 400 mg by mouth at bedtime.    [provider]  spironolactone (ALDACTONE) 50 MG tablet Take 1 tablet (50 mg total) by mouth daily. 01/14/20   Patwardhan, Reynold Bowen, MD  torsemide 40 MG TABS Take 40 mg by mouth daily. 10/08/20 11/07/20  Bonnell Public, MD    Allergies    Patient has no known allergies.  Review of Systems   Review of Systems  Psychiatric/Behavioral: Negative for suicidal ideas.  All other systems reviewed and are negative.   Physical Exam Updated Vital Signs BP (!) 147/91 (BP Location: Left Arm)   Pulse (!) 110   Temp 98.6 F (37 C) (Oral)   Resp 20   Ht 5\' 5"  (1.651 m)   Wt (!) 217.7 kg   LMP 02/13/2014   SpO2 92%   BMI 79.88 kg/m   Physical Exam Vitals and nursing note reviewed.  Constitutional:      General: She is not in acute distress.    Appearance: She is well-developed.  HENT:     Head: Normocephalic and atraumatic.     Right Ear: Hearing normal.     Left Ear: Hearing normal.     Nose: Nose normal.  Eyes:     Conjunctiva/sclera: Conjunctivae normal.     Pupils: Pupils are equal, round, and reactive to light.  Cardiovascular:     Rate and Rhythm: Regular rhythm.     Heart sounds: S1 normal and S2 normal. No murmur heard. No friction rub. No gallop.   Pulmonary:     Effort: Pulmonary effort is normal. No respiratory distress.     Breath sounds: Normal breath sounds.  Chest:     Chest wall: No tenderness.  Abdominal:     General: Bowel sounds are normal.     Palpations: Abdomen is soft.     Tenderness: There is no abdominal tenderness. There is no guarding or rebound. Negative signs include Murphy's sign and  McBurney's sign.     Hernia: No hernia is present.  Musculoskeletal:        General: Normal range of motion.     Cervical back: Normal range of motion and neck supple.  Skin:    General: Skin is warm and dry.     Findings: No rash.  Neurological:     Mental  Status: She is alert and oriented to person, place, and time.     GCS: GCS eye subscore is 4. GCS verbal subscore is 5. GCS motor subscore is 6.     Cranial Nerves: No cranial nerve deficit.     Sensory: No sensory deficit.     Coordination: Coordination normal.  Psychiatric:        Speech: Speech normal.        Behavior: Behavior normal.        Thought Content: Thought content normal.     ED Results / Procedures / Treatments   Labs (all labs ordered are listed, but only abnormal results are displayed) Labs Reviewed - No data to display  EKG None  Radiology No results found.  Procedures Procedures   Medications Ordered in ED Medications - No data to display  ED Course  I have reviewed the triage vital signs and the nursing notes.  Pertinent labs & imaging results that were available during my care of the patient were reviewed by me and considered in my medical decision making (see chart for details).    MDM Rules/Calculators/A&P                          Patient concerned that she overdosed on her Seroquel.  She normally takes 400 mg at night and accidentally took a second dose, total of 800 mg.  The first dose was 6 and half hours ago and the second dose was 5 hours ago.  Patient is awake, alert, oriented.  This was not an attempt to harm herself.  Patient reassured, she does not require any further work-up or monitoring.  Final Clinical Impression(s) / ED Diagnoses Final diagnoses:  Accidental drug overdose, initial encounter    Rx / DC Orders ED Discharge Orders    None       Breydan Shilling[place], Gwenyth Allegra, MD 11/26/20 [phone]

## 2020-11-26 NOTE — ED Triage Notes (Signed)
Pt came in with c/o accidentally taking double the dose of her Seroquel. Pt took 800mg 

## 2020-11-27 NOTE — Telephone Encounter (Signed)
She already has one she was seen by Nigel Mormon, MD on 01/14/20. She can call their office and set up another appointment if needed.

## 2020-11-29 ENCOUNTER — Telehealth: Payer: Self-pay | Admitting: Pharmacist

## 2020-11-29 NOTE — Telephone Encounter (Signed)
Called pt regarding recent HR readings >100. Pt reports that she recently has been feeling more SOB and feeling like she is retaining for fluid weight. Pt with recent CAP hospital admission. Reports that she has been using her nebulizer with minimal symptom relief. Has been taking 3 tabs of torsemide 40 mg over the past few days with only mild improvement. Pt reports that she is complaint with her medication regimen. Pt requesting to be seen earlier to assess her recent weight gain, SOB, and palpitation complains. Reviewed with Celeste and transferred pt to the front staff to schedule OV w/ Celeste.

## 2020-12-01 NOTE — Progress Notes (Deleted)
Subjective:   Cynthia Becker, female    DOB: 1970/08/09, 50 y.o.   MRN: [id number]   Chief complaint:  Hypertension  50 year old African-American female with hypertension, hyperlipidemia, type 2 DM, morbid obesity, hyperlipidemia.  ***Patient was last seen in our office by Dr. Virgina Jock 01/14/2020 at which time she was stable and recommended for follow-up in 1 year.  However she called the office 11/29/2020 with concerns of elevated heart rate, shortness of breath, and weight gain, as well as palpitations. She now presents for urgent visit.  Since last visit patient has been hospitalized with community-acquired pneumonia and April 2022.  She was also recently evaluated on 11/26/2020 in [facility] emergency department with concerns that she had taken double her typical dose of Seroquel.  At that time work-up was reassuring and patient was safely discharged home.  ***  Blood pressure log reviewed. Average BP is 123/80 mmHg. BP elevated today, but she endorses a lot of pain from her left hip. She has been consuming close to 2000 mg ibuprofen everyday. She has not lost any weight and continues to consume fruit juices. She was recommend nocturnal oxygen by pulmonology.   Current Outpatient Medications on File Prior to Visit  Medication Sig Dispense Refill   ACCU-CHEK FASTCLIX LANCETS MISC USE TO CHECK BLOOD SUGAR TWICE DAILY  11   ACCU-CHEK GUIDE test strip USE 1 STRIP TWICE DAILY  11   albuterol (PROVENTIL) (2.5 MG/3ML) 0.083% nebulizer solution USE 1 VIAL IN NEBULIZER EVERY 6 HOURS AS NEEDED FOR WHEEZING OR SHORTNESS OF BREATH (Patient taking differently: Take 2.5 mg by nebulization every 6 (six) hours as needed for wheezing or shortness of breath.) 150 mL 0   albuterol (VENTOLIN HFA) 108 (90 Base) MCG/ACT inhaler Inhale 2 puffs into the lungs every 6 (six) hours as needed for wheezing or shortness of breath.     carvedilol (COREG) 3.125 MG tablet Take 1 tablet (3.125 mg total) by mouth 2 (two)  times daily with a meal. 60 tablet 1   cetirizine (ZYRTEC ALLERGY) 10 MG tablet Take 1 tablet (10 mg total) by mouth daily. 30 tablet 11   Fluticasone-Salmeterol (ADVAIR DISKUS) 250-50 MCG/DOSE AEPB Inhale 1 puff into the lungs 2 (two) times daily. 60 each 11   gabapentin (NEURONTIN) 600 MG tablet Take 1 tablet (600 mg total) by mouth 3 (three) times daily. 180 tablet 0   glipiZIDE (GLUCOTROL) 10 MG tablet Take 1 tablet (10 mg total) by mouth 2 (two) times daily before a meal. 60 tablet 3   insulin glargine (LANTUS) 100 UNIT/ML injection Inject 0.2 mLs (20 Units total) into the skin at bedtime. 10 mL 11   isosorbide-hydrALAZINE (BIDIL) 20-37.5 MG tablet Take 1 tablet by mouth in the morning and at bedtime. 180 tablet 2   linaclotide (LINZESS) 290 MCG CAPS capsule Take 1 capsule (290 mcg total) by mouth daily before breakfast. 90 capsule 3   montelukast (SINGULAIR) 10 MG tablet Take 1 tablet (10 mg total) by mouth at bedtime. 30 tablet 11   omeprazole (PRILOSEC) 20 MG capsule Take 20 mg by mouth at bedtime.      Oxycodone HCl 10 MG TABS Take 10 mg by mouth every 6 (six) hours as needed (pain). Taking 3-4 times a day     QUEtiapine (SEROQUEL) 400 MG tablet Take 400 mg by mouth at bedtime.     spironolactone (ALDACTONE) 50 MG tablet Take 1 tablet (50 mg total) by mouth daily. 90 tablet 3   torsemide  40 MG TABS Take 40 mg by mouth daily. (Patient taking differently: Take 40 mg by mouth daily. Pt currently taking 120 mg daily as directed by PCP) 30 tablet 1   No current facility-administered medications on file prior to visit.    Cardiovascular studies:  ***  EKG 09/06/2019: Sinus rhythm 79 bpm. Normal EKG.   Echocardiogram 08/18/2019:  Study Quality: Technically Difficult  Normal LV systolic function with visual EF 50-55%. Left ventricle cavity  is normal in size. Normal global wall motion. Doppler evidence of grade II  diastolic dysfunction, elevated LAP. No obvious regional wall motion   abnormalities. Moderate left ventricular hypertrophy.  Left atrial cavity is moderately dilated.  No significant valvular abnormalities.  IVC is dilated with a respiratory response of >50%.  Prior study dated 03/2017: Mild concentric remodeling of the left  ventricle. Mild decrease in global wall motion. Visual EF is 45-50%. Grade  I diastolic dysfunction, normal LAP. Left atrial cavity is mildly dilated.  Recent labs: 09/2020: Hemoglobin 15.2, hematocrit 47.8, MCV 79.0, platelet 292 Sodium 138, potassium 4.9, glucose 176, BUN 11, creatinine 0.78, GFR >60 A1c 7.2% BNP 17.9  12/07/2019: H/H 15/45. MCV 78. Platelets 254  08/17/2019: Glucose 116, BUN/Cr 10/0.86. EGFR 92. Na/K 141/4.7.   07/08/2019: Glucose 123, BUN/Cr 11/0.73. EGFR >60. Na/K 138/4.8. Rest of the CMP normal H/H 15/50. MCV 75. Platelets 301 HbA1C 6.8%    Review of Systems  Cardiovascular:  Positive for dyspnea on exertion and leg swelling. Negative for chest pain, palpitations and syncope.  Respiratory:  Positive for shortness of breath.   Gastrointestinal:  Negative for nausea and vomiting.  All other systems reviewed and are negative.       There were no vitals filed for this visit.     Objective:    Physical Exam Vitals and nursing note reviewed.  Constitutional:      General: She is not in acute distress.    Appearance: She is well-developed.     Comments: Morbidly obese  Neck:     Vascular: No JVD.  Cardiovascular:     Rate and Rhythm: Normal rate and regular rhythm.     Pulses: Intact distal pulses.     Heart sounds: No murmur heard. Pulmonary:     Effort: Pulmonary effort is normal.          Assessment & Recommendations:   50 year old African-American female with hypertension, hyperlipidemia, type 2 DM, morbid obesity, hyperlipidemia  *** Shortness of breath:    Hypertension: Currently on daily, BiDil 20-37.5 mg to 1 tab tid,spironolactone 50 mg daily.  Continue the  same.  Morbid obesity: This remains her primary comorbidity, likely responsible for most of her medical issues.  Strongly recommend considering surgical weight loss, in addition to diet and lifestyle changes. She is motivated and wants to lose weight.  Type 2 DM: Follow up with PCP.  Cautioned against use of ibuprofen. Needs f/u w/pain management.   F/u in 1 year  Nigel Mormon, MD [facility] Pager: [phone] Office: [phone] If no answer Cell [phone]

## 2020-12-02 ENCOUNTER — Other Ambulatory Visit: Payer: Self-pay | Admitting: Critical Care Medicine

## 2020-12-04 ENCOUNTER — Ambulatory Visit: Payer: Medicaid Other | Admitting: Student

## 2020-12-11 ENCOUNTER — Other Ambulatory Visit: Payer: Self-pay | Admitting: Nurse Practitioner

## 2020-12-12 ENCOUNTER — Other Ambulatory Visit: Payer: Self-pay | Admitting: Nurse Practitioner

## 2020-12-12 DIAGNOSIS — R0602 Shortness of breath: Secondary | ICD-10-CM

## 2020-12-27 ENCOUNTER — Other Ambulatory Visit: Payer: Self-pay | Admitting: Nurse Practitioner

## 2020-12-27 MED ORDER — INSULIN GLARGINE 100 UNIT/ML SOLOSTAR PEN: 20.0000 [IU] | PEN_INJECTOR | Freq: Every day | SUBCUTANEOUS | 11 refills | Status: DC

## 2020-12-27 MED ORDER — "PEN NEEDLES 5/16"" 31G X 8 MM MISC": 1.0000 "pen " | Freq: Every evening | 2 refills | Status: DC

## 2021-01-08 ENCOUNTER — Telehealth (INDEPENDENT_AMBULATORY_CARE_PROVIDER_SITE_OTHER): Payer: Medicaid Other | Admitting: Nurse Practitioner

## 2021-01-08 ENCOUNTER — Other Ambulatory Visit: Payer: Self-pay | Admitting: Internal Medicine

## 2021-01-08 ENCOUNTER — Encounter: Payer: Self-pay | Admitting: Nurse Practitioner

## 2021-01-08 ENCOUNTER — Other Ambulatory Visit: Payer: Self-pay

## 2021-01-08 DIAGNOSIS — R600 Localized edema: Secondary | ICD-10-CM | POA: Diagnosis not present

## 2021-01-08 DIAGNOSIS — M5126 Other intervertebral disc displacement, lumbar region: Secondary | ICD-10-CM | POA: Diagnosis not present

## 2021-01-08 DIAGNOSIS — M79604 Pain in right leg: Secondary | ICD-10-CM | POA: Diagnosis not present

## 2021-01-08 DIAGNOSIS — E0843 Diabetes mellitus due to underlying condition with diabetic autonomic (poly)neuropathy: Secondary | ICD-10-CM

## 2021-01-08 DIAGNOSIS — I10 Essential (primary) hypertension: Secondary | ICD-10-CM

## 2021-01-08 MED ORDER — OMEPRAZOLE 20 MG PO CPDR: 20.0000 mg | DELAYED_RELEASE_CAPSULE | Freq: Every day | ORAL | 3 refills | Status: DC

## 2021-01-08 MED ORDER — CETIRIZINE HCL 10 MG PO TABS: 10.0000 mg | ORAL_TABLET | Freq: Every day | ORAL | 3 refills | Status: DC

## 2021-01-08 MED ORDER — ACCU-CHEK GUIDE VI STRP: 1.0000 | ORAL_STRIP | Freq: Three times a day (TID) | 3 refills | Status: DC

## 2021-01-08 MED ORDER — PREDNISONE 10 MG (21) PO TBPK: ORAL_TABLET | ORAL | 0 refills | Status: DC

## 2021-01-08 MED ORDER — GABAPENTIN 600 MG PO TABS: 600.0000 mg | ORAL_TABLET | Freq: Three times a day (TID) | ORAL | 3 refills | Status: DC

## 2021-01-08 NOTE — Patient Instructions (Signed)
Diabetes Mellitus and Nutrition, Adult When you have diabetes, or diabetes mellitus, it is very important to have healthy eating habits because your blood sugar (glucose) levels are greatly affected by what you eat and drink. Eating healthy foods in the right amounts, at about the same times every day, can help you:  Control your blood glucose.  Lower your risk of heart disease.  Improve your blood pressure.  Reach or maintain a healthy weight. What can affect my meal plan? Every person with diabetes is different, and each person has different needs for a meal plan. Your health care provider may recommend that you work with a dietitian to make a meal plan that is best for you. Your meal plan may vary depending on factors such as:  The calories you need.  The medicines you take.  Your weight.  Your blood glucose, blood pressure, and cholesterol levels.  Your activity level.  Other health conditions you have, such as heart or kidney disease. How do carbohydrates affect me? Carbohydrates, also called carbs, affect your blood glucose level more than any other type of food. Eating carbs naturally raises the amount of glucose in your blood. Carb counting is a method for keeping track of how many carbs you eat. Counting carbs is important to keep your blood glucose at a healthy level, especially if you use insulin or take certain oral diabetes medicines. It is important to know how many carbs you can safely have in each meal. This is different for every person. Your dietitian can help you calculate how many carbs you should have at each meal and for each snack. How does alcohol affect me? Alcohol can cause a sudden decrease in blood glucose (hypoglycemia), especially if you use insulin or take certain oral diabetes medicines. Hypoglycemia can be a life-threatening condition. Symptoms of hypoglycemia, such as sleepiness, dizziness, and confusion, are similar to symptoms of having too much  alcohol.  Do not drink alcohol if: ? Your health care provider tells you not to drink. ? You are pregnant, may be pregnant, or are planning to become pregnant.  If you drink alcohol: ? Do not drink on an empty stomach. ? Limit how much you use to:  0-1 drink a day for women.  0-2 drinks a day for men. ? Be aware of how much alcohol is in your drink. In the [place] one drink equals one 12 oz bottle of beer (355 mL), one 5 oz glass of wine (148 mL), or one 1 oz glass of hard liquor (44 mL). ? Keep yourself hydrated with water, diet soda, or unsweetened iced tea.  Keep in mind that regular soda, juice, and other mixers may contain a lot of sugar and must be counted as carbs. What are tips for following this plan? Reading food labels  Start by checking the serving size on the "Nutrition Facts" label of packaged foods and drinks. The amount of calories, carbs, fats, and other nutrients listed on the label is based on one serving of the item. Many items contain more than one serving per package.  Check the total grams (g) of carbs in one serving. You can calculate the number of servings of carbs in one serving by dividing the total carbs by 15. For example, if a food has 30 g of total carbs per serving, it would be equal to 2 servings of carbs.  Check the number of grams (g) of saturated fats and trans fats in one serving. Choose foods that have   a low amount or none of these fats.  Check the number of milligrams (mg) of salt (sodium) in one serving. Most people should limit total sodium intake to less than 2,300 mg per day.  Always check the nutrition information of foods labeled as "low-fat" or "nonfat." These foods may be higher in added sugar or refined carbs and should be avoided.  Talk to your dietitian to identify your daily goals for nutrients listed on the label. Shopping  Avoid buying canned, pre-made, or processed foods. These foods tend to be high in fat, sodium, and added  sugar.  Shop around the outside edge of the grocery store. This is where you will most often find fresh fruits and vegetables, bulk grains, fresh meats, and fresh dairy. Cooking  Use low-heat cooking methods, such as baking, instead of high-heat cooking methods like deep frying.  Cook using healthy oils, such as olive, canola, or sunflower oil.  Avoid cooking with butter, cream, or high-fat meats. Meal planning  Eat meals and snacks regularly, preferably at the same times every day. Avoid going long periods of time without eating.  Eat foods that are high in fiber, such as fresh fruits, vegetables, beans, and whole grains. Talk with your dietitian about how many servings of carbs you can eat at each meal.  Eat 4-6 oz (112-168 g) of lean protein each day, such as lean meat, chicken, fish, eggs, or tofu. One ounce (oz) of lean protein is equal to: ? 1 oz (28 g) of meat, chicken, or fish. ? 1 egg. ?  cup (62 g) of tofu.  Eat some foods each day that contain healthy fats, such as avocado, nuts, seeds, and fish.   What foods should I eat? Fruits Berries. Apples. Oranges. Peaches. Apricots. Plums. Grapes. Mango. Papaya. Pomegranate. Kiwi. Cherries. Vegetables Lettuce. Spinach. Leafy greens, including kale, chard, collard greens, and mustard greens. Beets. Cauliflower. Cabbage. Broccoli. Carrots. Green beans. Tomatoes. Peppers. Onions. Cucumbers. Brussels sprouts. Grains Whole grains, such as whole-wheat or whole-grain bread, crackers, tortillas, cereal, and pasta. Unsweetened oatmeal. Quinoa. Brown or wild rice. Meats and other proteins Seafood. Poultry without skin. Lean cuts of poultry and beef. Tofu. Nuts. Seeds. Dairy Low-fat or fat-free dairy products such as milk, yogurt, and cheese. The items listed above may not be a complete list of foods and beverages you can eat. Contact a dietitian for more information. What foods should I avoid? Fruits Fruits canned with  syrup. Vegetables Canned vegetables. Frozen vegetables with butter or cream sauce. Grains Refined white flour and flour products such as bread, pasta, snack foods, and cereals. Avoid all processed foods. Meats and other proteins Fatty cuts of meat. Poultry with skin. Breaded or fried meats. Processed meat. Avoid saturated fats. Dairy Full-fat yogurt, cheese, or milk. Beverages Sweetened drinks, such as soda or iced tea. The items listed above may not be a complete list of foods and beverages you should avoid. Contact a dietitian for more information. Questions to ask a health care provider  Do I need to meet with a diabetes educator?  Do I need to meet with a dietitian?  What number can I call if I have questions?  When are the best times to check my blood glucose? Where to find more information:  American Diabetes Association: diabetes.org  Academy of Nutrition and Dietetics: www.eatright.org  National Institute of Diabetes and Digestive and Kidney Diseases: www.niddk.nih.gov  Association of Diabetes Care and Education Specialists: www.diabeteseducator.org Summary  It is important to have healthy eating   habits because your blood sugar (glucose) levels are greatly affected by what you eat and drink.  A healthy meal plan will help you control your blood glucose and maintain a healthy lifestyle.  Your health care provider may recommend that you work with a dietitian to make a meal plan that is best for you.  Keep in mind that carbohydrates (carbs) and alcohol have immediate effects on your blood glucose levels. It is important to count carbs and to use alcohol carefully. This information is not intended to replace advice given to you by your health care provider. Make sure you discuss any questions you have with your health care provider. Document Revised: 05/18/2019 Document Reviewed: 05/18/2019 Elsevier Patient Education  2021 Elsevier Inc.  

## 2021-01-08 NOTE — Progress Notes (Signed)
Pulmonary medicine  [facility] [place], [place]  [address] Phone:  [phone]   Fax:  [phone]  Virtual Visit via Video Note  I connected with Cynthia Becker on 01/08/21 at  9:00 AM EDT by video and verified that I am speaking with the correct person using two identifiers.   I discussed the limitations, risks, security and privacy concerns of performing an evaluation and management service by video and the availability of in person appointments. I also discussed with the patient that there may be a patient responsible charge related to this service. The patient expressed understanding and agreed to proceed.  Patient home Provider Office  History of Present Illness:  She  has a past medical history of Anemia, Anxiety, Arthritis, Asthma, CHF (congestive heart failure) ([place]), CN (constipation) (09/14/2014), DEGENERATIVE DISC DISEASE, LUMBOSACRAL SPINE W/RADICULOPATHY (11/01/2009), Depression, Diabetes ([place]), H/O dizziness, Headache(784.0), High blood pressure, HYPERLIPIDEMIA (02/13/2010), INSOMNIA (08/24/2008), Morbid obesity ([place]) (05/24/2008), and SVD (spontaneous vaginal delivery).   She reports that she was taking Lasix BID. Her last dose was 2 weeks ago. She has been having more SOB and pressure in the chest. She is aware that this is due to the fluid. She continues to have leg pain. She does follow up with pain mgmt. She did have one injection for the knee however this has not ben effective for her pain.  Denies headache, dizziness, visual changes, nausea, vomiting or any edema.     Observations/Objective: Able to speak in full sentences without difficulty.  No exam.  Virtual visit  Assessment and Plan:  Shortness of breath prednisone Dosepak  Bilateral peripheral edema Persistent Meds ordered this encounter  Medications   cetirizine (ZYRTEC ALLERGY) 10 MG tablet    Sig: Take 1 tablet (10 mg total) by mouth daily.    Dispense:  90 tablet     Refill:  3    Order Specific Question:   Supervising Provider    Answer:   Tresa Garter [[id number]]   gabapentin (NEURONTIN) 600 MG tablet    Sig: Take 1 tablet (600 mg total) by mouth 3 (three) times daily.    Dispense:  270 tablet    Refill:  3    Order Specific Question:   Supervising Provider    Answer:   Tresa Garter [[id number]]   omeprazole (PRILOSEC) 20 MG capsule    Sig: Take 1 capsule (20 mg total) by mouth at bedtime.    Dispense:  90 capsule    Refill:  3    Order Specific Question:   Supervising Provider    Answer:   Tresa Garter [id number]   ACCU-CHEK GUIDE test strip    Sig: 1 each by Other route 3 (three) times daily. Use as instructed    Dispense:  270 each    Refill:  3    Order Specific Question:   Supervising Provider    Answer:   Tresa Garter [[id number]]   predniSONE (STERAPRED UNI-PAK 21 TAB) 10 MG (21) TBPK tablet    Sig: Take as directed    Dispense:  21 each    Refill:  0    Order Specific Question:   Supervising Provider    Answer:   Tresa Garter [[id number]]    Follow-up: Return in about 3 months (around 04/10/2021).    [name], NP   Follow Up Instructions:    I discussed the assessment and treatment plan with the patient. The  patient was provided an opportunity to ask questions and all were answered. The patient agreed with the plan and demonstrated an understanding of the instructions.   The patient was advised to call back or seek an in-person evaluation if the symptoms worsen or if the condition fails to improve as anticipated.  I provided 17 minutes of video- visit time during this encounter.   [name], NP

## 2021-01-11 ENCOUNTER — Other Ambulatory Visit: Payer: Self-pay | Admitting: Nurse Practitioner

## 2021-01-11 MED ORDER — TORSEMIDE 40 MG PO TABS: 40.0000 mg | ORAL_TABLET | Freq: Every day | ORAL | 1 refills | Status: DC

## 2021-01-12 ENCOUNTER — Ambulatory Visit: Payer: Medicaid Other | Admitting: Cardiology

## 2021-01-12 NOTE — Progress Notes (Deleted)
Subjective:   Cynthia Becker, female    DOB: Dec 09, 1970, 50 y.o.   MRN: [id number]   Chief complaint:  Hypertension  50 year old African-American female with hypertension, hyperlipidemia, type 2 DM, morbid obesity, hyperlipidemia.  Blood pressure log reviewed. Average BP is 123/80 mmHg. BP elevated today, but she endorses a lot of pain from her left hip. She has been consuming close to 2000 mg ibuprofen everyday. She has not lost any weight and continues to consume fruit juices. She was recommend nocturnal oxygen by pulmonology.   Current Outpatient Medications on File Prior to Visit  Medication Sig Dispense Refill   ACCU-CHEK FASTCLIX LANCETS MISC USE TO CHECK BLOOD SUGAR TWICE DAILY  11   ACCU-CHEK GUIDE test strip 1 each by Other route 3 (three) times daily. Use as instructed 270 each 3   albuterol (PROVENTIL) (2.5 MG/3ML) 0.083% nebulizer solution USE 1 VIAL IN NEBULIZER EVERY 6 HOURS AS NEEDED FOR WHEEZING OR SHORTNESS OF BREATH 150 mL 0   albuterol (VENTOLIN HFA) 108 (90 Base) MCG/ACT inhaler Inhale 2 puffs into the lungs every 6 (six) hours as needed for wheezing or shortness of breath.     carvedilol (COREG) 3.125 MG tablet Take 1 tablet (3.125 mg total) by mouth 2 (two) times daily with a meal. 60 tablet 1   cetirizine (ZYRTEC ALLERGY) 10 MG tablet Take 1 tablet (10 mg total) by mouth daily. 90 tablet 3   cyclobenzaprine (FLEXERIL) 5 MG tablet Take 1 tablet by mouth three times daily as needed for muscle spasm 90 tablet 0   Fluticasone-Salmeterol (ADVAIR DISKUS) 250-50 MCG/DOSE AEPB Inhale 1 puff into the lungs 2 (two) times daily. 60 each 11   gabapentin (NEURONTIN) 600 MG tablet Take 1 tablet (600 mg total) by mouth 3 (three) times daily. 270 tablet 3   glipiZIDE (GLUCOTROL) 10 MG tablet Take 1 tablet (10 mg total) by mouth 2 (two) times daily before a meal. 60 tablet 3   ibuprofen (ADVIL) 800 MG tablet Take 800 mg by mouth every 8 (eight) hours as needed.     insulin glargine  (LANTUS) 100 UNIT/ML Solostar Pen Inject 20 Units into the skin at bedtime. 15 mL 11   Insulin Pen Needle (PEN NEEDLES 31GX5/16") 31G X 8 MM MISC 1 pen by Does not apply route at bedtime. 100 each 2   isosorbide-hydrALAZINE (BIDIL) 20-37.5 MG tablet Take 1 tablet by mouth in the morning and at bedtime. 180 tablet 2   LINZESS 290 MCG CAPS capsule TAKE 1 CAPSULE BY MOUTH ONCE DAILY BEFORE BREAKFAST 90 capsule 0   montelukast (SINGULAIR) 10 MG tablet TAKE 1 TABLET BY MOUTH AT BEDTIME 30 tablet 0   omeprazole (PRILOSEC) 20 MG capsule Take 1 capsule (20 mg total) by mouth at bedtime. 90 capsule 3   ondansetron (ZOFRAN) 4 MG tablet TAKE 1 TABLET BY MOUTH EVERY 8 HOURS AS NEEDED FOR NAUSEA FOR VOMITING 20 tablet 0   Oxycodone HCl 10 MG TABS Take 10 mg by mouth every 6 (six) hours as needed (pain). Taking 3-4 times a day     predniSONE (STERAPRED UNI-PAK 21 TAB) 10 MG (21) TBPK tablet Take as directed 21 each 0   QUEtiapine (SEROQUEL) 400 MG tablet Take 400 mg by mouth at bedtime.     spironolactone (ALDACTONE) 50 MG tablet Take 1 tablet (50 mg total) by mouth daily. 90 tablet 3   Torsemide 40 MG TABS Take 40 mg by mouth daily. 30 tablet 1   No  current facility-administered medications on file prior to visit.    Cardiovascular studies:  EKG 09/06/2019: Sinus rhythm 79 bpm. Normal EKG.   Echocardiogram 08/18/2019:  Study Quality: Technically Difficult  Normal LV systolic function with visual EF 50-55%. Left ventricle cavity  is normal in size. Normal global wall motion. Doppler evidence of grade II  diastolic dysfunction, elevated LAP. No obvious regional wall motion  abnormalities. Moderate left ventricular hypertrophy.  Left atrial cavity is moderately dilated.  No significant valvular abnormalities.  IVC is dilated with a respiratory response of >50%.  Prior study dated 03/2017: Mild concentric remodeling of the left  ventricle. Mild decrease in global wall motion. Visual EF is 45-50%. Grade   I diastolic dysfunction, normal LAP. Left atrial cavity is mildly dilated.  Recent labs: 12/07/2019: H/H 15/45. MCV 78. Platelets 254  08/17/2019: Glucose 116, BUN/Cr 10/0.86. EGFR 92. Na/K 141/4.7.   07/08/2019: Glucose 123, BUN/Cr 11/0.73. EGFR >60. Na/K 138/4.8. Rest of the CMP normal H/H 15/50. MCV 75. Platelets 301 HbA1C 6.8%    Review of Systems  Cardiovascular:  Positive for dyspnea on exertion and leg swelling. Negative for chest pain, palpitations and syncope.  Respiratory:  Positive for shortness of breath.   Gastrointestinal:  Negative for nausea and vomiting.  All other systems reviewed and are negative.       There were no vitals filed for this visit.     Objective:    Physical Exam Vitals and nursing note reviewed.  Constitutional:      General: She is not in acute distress.    Appearance: She is well-developed.     Comments: Morbidly obese  Neck:     Vascular: No JVD.  Cardiovascular:     Rate and Rhythm: Normal rate and regular rhythm.     Pulses: Intact distal pulses.     Heart sounds: No murmur heard. Pulmonary:     Effort: Pulmonary effort is normal.          Assessment & Recommendations:   50 year old African-American female with hypertension, hyperlipidemia, type 2 DM, morbid obesity, hyperlipidemia  *** Hypertension: Currently on daily, BiDil 20-37.5 mg to 1 tab tid,spironolactone 50 mg daily.  Continue the same.  *** Morbid obesity: This remains her primary comorbidity, likely responsible for most of her medical issues.  Strongly recommend considering surgical weight loss, in addition to diet and lifestyle changes. She is motivated and wants to lose weight.  *** Type 2 DM: Follow up with PCP.  *** Cautioned against use of ibuprofen. Needs f/u w/pain management.   *** F/u in 1 year  Nigel Mormon, MD [facility] [place] Pager: [phone] Office: [phone] If no answer Cell [phone]

## 2021-01-17 ENCOUNTER — Ambulatory Visit: Payer: Medicaid Other | Admitting: Cardiology

## 2021-02-07 ENCOUNTER — Telehealth (INDEPENDENT_AMBULATORY_CARE_PROVIDER_SITE_OTHER): Payer: Self-pay | Admitting: Nurse Practitioner

## 2021-02-07 ENCOUNTER — Telehealth (INDEPENDENT_AMBULATORY_CARE_PROVIDER_SITE_OTHER): Payer: Medicaid Other | Admitting: Nurse Practitioner

## 2021-02-07 ENCOUNTER — Encounter (INDEPENDENT_AMBULATORY_CARE_PROVIDER_SITE_OTHER): Payer: Self-pay | Admitting: Nurse Practitioner

## 2021-02-07 DIAGNOSIS — U071 COVID-19: Secondary | ICD-10-CM

## 2021-02-07 MED ORDER — MOLNUPIRAVIR EUA 200MG CAPSULE: 4.0000 | ORAL_CAPSULE | Freq: Two times a day (BID) | ORAL | 0 refills | Status: AC

## 2021-02-07 NOTE — Patient Instructions (Addendum)
Covid 19:   Stay well hydrated  Stay active  Deep breathing exercises  May start vitamin C 2,000 mg daily, vitamin D3 2,000 IU daily, Zinc 220 mg daily  May take tylenol for fever or pain  May take mucinex DM twice daily   Follow up:  Follow up if needed  You are being prescribed MOLNUPIRAVIR for COVID-19 infection.   Please call the pharmacy or go through the drive through vs going inside if you are picking up the mediation yourself to prevent further spread. If prescribed to a [facility] affiliated pharmacy, a pharmacist will bring the medication out to your car.   ADMINISTRATION INSTRUCTIONS: Take with or without food. Swallow the tablets whole. Don't chew, crush, or break the medications because it might not work as well  For each dose of the medication, you should be taking FOUR tablets at one time, TWICE a day   Finish your full five-day course of Molnupiravir even if you feel better before you're done. Stopping this medication too early can make it less effective to prevent severe illness related to [place].    Molnupiravir is prescribed for YOU ONLY. Don't share it with others, even if they have similar symptoms as you. This medication might not be right for everyone.   Make sure to take steps to protect yourself and others while you're taking this medication in order to get well soon and to prevent others from getting sick with COVID-19.   **If you are of childbearing potential (any gender) - it is advised to not get pregnant while taking this medication and recommended that condoms are used for female partners the next 3 months after taking the medication out of extreme caution    COMMON SIDE EFFECTS: Diarrhea Nausea  Dizziness    If your COVID-19 symptoms get worse, get medical help right away. Call 911 if you experience symptoms such as worsening cough, trouble breathing, chest pain that doesn't go away, confusion, a hard time staying awake, and pale or  blue-colored skin. This medication won't prevent all COVID-19 cases from getting worse.      Person Under Monitoring Name: Cynthia Becker  Location: [place] [id number]   Infection Prevention Recommendations for Individuals Confirmed to have, or Being Evaluated for, 2019 Novel Coronavirus (COVID-19) Infection Who Receive Care at Home  Individuals who are confirmed to have, or are being evaluated for, COVID-19 should follow the prevention steps below until a healthcare provider or local or state health department says they can return to normal activities.  Stay home except to get medical care You should restrict activities outside your home, except for getting medical care. Do not go to work, school, or public areas, and do not use public transportation or taxis.  Call ahead before visiting your doctor Before your medical appointment, call the healthcare provider and tell them that you have, or are being evaluated for, COVID-19 infection. This will help the healthcare provider's office take steps to keep other people from getting infected. Ask your healthcare provider to call the local or state health department.  Monitor your symptoms Seek prompt medical attention if your illness is worsening (e.g., difficulty breathing). Before going to your medical appointment, call the healthcare provider and tell them that you have, or are being evaluated for, COVID-19 infection. Ask your healthcare provider to call the local or state health department.  Wear a facemask You should wear a facemask that covers your nose and mouth when you are in  the same room with other people and when you visit a healthcare provider. People who live with or visit you should also wear a facemask while they are in the same room with you.  Separate yourself from other people in your home As much as possible, you should stay in a different room from other people in your home. Also, you  should use a separate bathroom, if available.  Avoid sharing household items You should not share dishes, drinking glasses, cups, eating utensils, towels, bedding, or other items with other people in your home. After using these items, you should wash them thoroughly with soap and water.  Cover your coughs and sneezes Cover your mouth and nose with a tissue when you cough or sneeze, or you can cough or sneeze into your sleeve. Throw used tissues in a lined trash can, and immediately wash your hands with soap and water for at least 20 seconds or use an alcohol-based hand rub.  Wash your [facility] your hands often and thoroughly with soap and water for at least 20 seconds. You can use an alcohol-based hand sanitizer if soap and water are not available and if your hands are not visibly dirty. Avoid touching your eyes, nose, and mouth with unwashed hands.   Prevention Steps for Caregivers and Household Members of Individuals Confirmed to have, or Being Evaluated for, COVID-19 Infection Being Cared for in the Home  If you live with, or provide care at home for, a person confirmed to have, or being evaluated for, COVID-19 infection please follow these guidelines to prevent infection:  Follow healthcare provider's instructions Make sure that you understand and can help the patient follow any healthcare provider instructions for all care.  Provide for the patient's basic needs You should help the patient with basic needs in the home and provide support for getting groceries, prescriptions, and other personal needs.  Monitor the patient's symptoms If they are getting sicker, call his or her medical provider and tell them that the patient has, or is being evaluated for, COVID-19 infection. This will help the healthcare provider's office take steps to keep other people from getting infected. Ask the healthcare provider to call the local or state health department.  Limit the number of  people who have contact with the patient If possible, have only one caregiver for the patient. Other household members should stay in another home or place of residence. If this is not possible, they should stay in another room, or be separated from the patient as much as possible. Use a separate bathroom, if available. Restrict visitors who do not have an essential need to be in the home.  Keep older adults, very young children, and other sick people away from the patient Keep older adults, very young children, and those who have compromised immune systems or chronic health conditions away from the patient. This includes people with chronic heart, lung, or kidney conditions, diabetes, and cancer.  Ensure good ventilation Make sure that shared spaces in the home have good air flow, such as from an air conditioner or an opened window, weather permitting.  Wash your hands often Wash your hands often and thoroughly with soap and water for at least 20 seconds. You can use an alcohol based hand sanitizer if soap and water are not available and if your hands are not visibly dirty. Avoid touching your eyes, nose, and mouth with unwashed hands. Use disposable paper towels to dry your hands. If not available, use dedicated cloth  towels and replace them when they become wet.  Wear a facemask and gloves Wear a disposable facemask at all times in the room and gloves when you touch or have contact with the patient's blood, body fluids, and/or secretions or excretions, such as sweat, saliva, sputum, nasal mucus, vomit, urine, or feces.  Ensure the mask fits over your nose and mouth tightly, and do not touch it during use. Throw out disposable facemasks and gloves after using them. Do not reuse. Wash your hands immediately after removing your facemask and gloves. If your personal clothing becomes contaminated, carefully remove clothing and launder. Wash your hands after handling contaminated clothing. Place  all used disposable facemasks, gloves, and other waste in a lined container before disposing them with other household waste. Remove gloves and wash your hands immediately after handling these items.  Do not share dishes, glasses, or other household items with the patient Avoid sharing household items. You should not share dishes, drinking glasses, cups, eating utensils, towels, bedding, or other items with a patient who is confirmed to have, or being evaluated for, COVID-19 infection. After the person uses these items, you should wash them thoroughly with soap and water.  Wash laundry thoroughly Immediately remove and wash clothes or bedding that have blood, body fluids, and/or secretions or excretions, such as sweat, saliva, sputum, nasal mucus, vomit, urine, or feces, on them. Wear gloves when handling laundry from the patient. Read and follow directions on labels of laundry or clothing items and detergent. In general, wash and dry with the warmest temperatures recommended on the label.  Clean all areas the individual has used often Clean all touchable surfaces, such as counters, tabletops, doorknobs, bathroom fixtures, toilets, phones, keyboards, tablets, and bedside tables, every day. Also, clean any surfaces that may have blood, body fluids, and/or secretions or excretions on them. Wear gloves when cleaning surfaces the patient has come in contact with. Use a diluted bleach solution (e.g., dilute bleach with 1 part bleach and 10 parts water) or a household disinfectant with a label that says EPA-registered for coronaviruses. To make a bleach solution at home, add 1 tablespoon of bleach to 1 quart (4 cups) of water. For a larger supply, add  cup of bleach to 1 gallon (16 cups) of water. Read labels of cleaning products and follow recommendations provided on product labels. Labels contain instructions for safe and effective use of the cleaning product including precautions you should take when  applying the product, such as wearing gloves or eye protection and making sure you have good ventilation during use of the product. Remove gloves and wash hands immediately after cleaning.  Monitor yourself for signs and symptoms of illness Caregivers and household members are considered close contacts, should monitor their health, and will be asked to limit movement outside of the home to the extent possible. Follow the monitoring steps for close contacts listed on the symptom monitoring form.   ? If you have additional questions, contact your local health department or call the epidemiologist on call at [phone] (available 24/7). ? This guidance is subject to change. For the most up-to-date guidance from [facility], please refer to their website: [email or web address]

## 2021-02-07 NOTE — Telephone Encounter (Signed)
My chart message sent with the following information:  You are being prescribed MOLNUPIRAVIR for COVID-19 infection.      Please call the pharmacy or go through the drive through vs going inside if you are picking up the mediation yourself to prevent further spread. If prescribed to a [facility] affiliated pharmacy, a pharmacist will bring the medication out to your car.     ADMINISTRATION INSTRUCTIONS: Take with or without food. Swallow the tablets whole. Don't chew, crush, or break the medications because it might not work as well   For each dose of the medication, you should be taking FOUR tablets at one time, TWICE a day    Finish your full five-day course of Molnupiravir even if you feel better before you're done. Stopping this medication too early can make it less effective to prevent severe illness related to [place].     Molnupiravir is prescribed for YOU ONLY. Don't share it with others, even if they have similar symptoms as you. This medication might not be right for everyone.    Make sure to take steps to protect yourself and others while you're taking this medication in order to get well soon and to prevent others from getting sick with COVID-19.     **If you are of childbearing potential (any gender) - it is advised to not get pregnant while taking this medication and recommended that condoms are used for female partners the next 3 months after taking the medication out of extreme caution      COMMON SIDE EFFECTS: Diarrhea Nausea  Dizziness       If your COVID-19 symptoms get worse, get medical help right away. Call 911 if you experience symptoms such as worsening cough, trouble breathing, chest pain that doesn't go away, confusion, a hard time staying awake, and pale or blue-colored skin. This medication won't prevent all COVID-19 cases from getting worse.

## 2021-02-07 NOTE — Progress Notes (Signed)
Virtual Visit via Video Note  I connected with Cynthia Becker on 02/07/21 at  9:50 AM EDT by a video enabled telemedicine application and verified that I am speaking with the correct person using two identifiers.  Location:  Patient: home Provider: remote   I discussed the limitations of evaluation and management by telemedicine and the availability of in person appointments. The patient expressed understanding and agreed to proceed.  History of Present Illness:  Patient presents today for video visit for recent positive COVID test.  Patient tested positive for COVID on 02/04/2021.  She states that she tested after her grandson tested positive.  She states that her symptoms did not actually start until 02/05/2021.  She complains of nausea, diarrhea, sore throat, fatigue, shortness of breath.  Patient does have a history of asthma and does have inhalers and nebulizers ordered and has been taking them as directed.  Patient was not vaccinated.  Patient does have a history of hypertension and as stated before asthma.  Patient is diabetic.  We discussed that she is high risk for complications and if her symptoms worsen she does need to go up directly to the ED for further evaluation.  Patient is agreeable.  We will order Molnupiravir oral anti viral treatment.  We also discussed home supportive measures.  Denies f/c/s, hemoptysis, PND, chest pain or edema.       Observations/Objective:  Vitals with BMI 11/26/2020 10/26/2020 10/07/2020  Height '5\' 5"'$  '5\' 5"'$  -  Weight 480 lbs 492 lbs 481 lbs 15 oz  BMI 79.88 [id number] [id number]  Systolic [id number] - [id number]  Diastolic 91 - 98  Pulse [id number] - 97      Assessment and Plan:  Covid 19:   Stay well hydrated  Stay active  Deep breathing exercises  May start vitamin C 2,000 mg daily, vitamin D3 2,000 IU daily, Zinc 220 mg daily  May take tylenol for fever or pain  May take mucinex DM twice daily   Follow up:  Follow up if needed  You are being prescribed  MOLNUPIRAVIR for COVID-19 infection.   Please call the pharmacy or go through the drive through vs going inside if you are picking up the mediation yourself to prevent further spread. If prescribed to a [facility] affiliated pharmacy, a pharmacist will bring the medication out to your car.   ADMINISTRATION INSTRUCTIONS: Take with or without food. Swallow the tablets whole. Don't chew, crush, or break the medications because it might not work as well  For each dose of the medication, you should be taking FOUR tablets at one time, TWICE a day   Finish your full five-day course of Molnupiravir even if you feel better before you're done. Stopping this medication too early can make it less effective to prevent severe illness related to [place].    Molnupiravir is prescribed for YOU ONLY. Don't share it with others, even if they have similar symptoms as you. This medication might not be right for everyone.   Make sure to take steps to protect yourself and others while you're taking this medication in order to get well soon and to prevent others from getting sick with COVID-19.   **If you are of childbearing potential (any gender) - it is advised to not get pregnant while taking this medication and recommended that condoms are used for female partners the next 3 months after taking the medication out of extreme caution    COMMON SIDE EFFECTS: Diarrhea Nausea  Dizziness  If your COVID-19 symptoms get worse, get medical help right away. Call 911 if you experience symptoms such as worsening cough, trouble breathing, chest pain that doesn't go away, confusion, a hard time staying awake, and pale or blue-colored skin. This medication won't prevent all COVID-19 cases from getting worse.     I discussed the assessment and treatment plan with the patient. The patient was provided an opportunity to ask questions and all were answered. The patient agreed with the plan and demonstrated an understanding  of the instructions.   The patient was advised to call back or seek an in-person evaluation if the symptoms worsen or if the condition fails to improve as anticipated.  Patient Instructions  Covid 19:   Stay well hydrated  Stay active  Deep breathing exercises  May start vitamin C 2,000 mg daily, vitamin D3 2,000 IU daily, Zinc 220 mg daily  May take tylenol for fever or pain  May take mucinex DM twice daily   Follow up:  Follow up if needed  You are being prescribed MOLNUPIRAVIR for COVID-19 infection.   Please call the pharmacy or go through the drive through vs going inside if you are picking up the mediation yourself to prevent further spread. If prescribed to a [facility] affiliated pharmacy, a pharmacist will bring the medication out to your car.   ADMINISTRATION INSTRUCTIONS: Take with or without food. Swallow the tablets whole. Don't chew, crush, or break the medications because it might not work as well  For each dose of the medication, you should be taking FOUR tablets at one time, TWICE a day   Finish your full five-day course of Molnupiravir even if you feel better before you're done. Stopping this medication too early can make it less effective to prevent severe illness related to [place].    Molnupiravir is prescribed for YOU ONLY. Don't share it with others, even if they have similar symptoms as you. This medication might not be right for everyone.   Make sure to take steps to protect yourself and others while you're taking this medication in order to get well soon and to prevent others from getting sick with COVID-19.   **If you are of childbearing potential (any gender) - it is advised to not get pregnant while taking this medication and recommended that condoms are used for female partners the next 3 months after taking the medication out of extreme caution    COMMON SIDE EFFECTS: Diarrhea Nausea  Dizziness    If your COVID-19 symptoms get worse,  get medical help right away. Call 911 if you experience symptoms such as worsening cough, trouble breathing, chest pain that doesn't go away, confusion, a hard time staying awake, and pale or blue-colored skin. This medication won't prevent all COVID-19 cases from getting worse.      Person Under Monitoring Name: Cynthia Becker  Location: [place] [address]   Infection Prevention Recommendations for Individuals Confirmed to have, or Being Evaluated for, 2019 Novel Coronavirus (COVID-19) Infection Who Receive Care at Home  Individuals who are confirmed to have, or are being evaluated for, COVID-19 should follow the prevention steps below until a healthcare provider or local or state health department says they can return to normal activities.  Stay home except to get medical care You should restrict activities outside your home, except for getting medical care. Do not go to work, school, or public areas, and do not use public transportation or taxis.  Call ahead before  visiting your doctor Before your medical appointment, call the healthcare provider and tell them that you have, or are being evaluated for, COVID-19 infection. This will help the healthcare provider's office take steps to keep other people from getting infected. Ask your healthcare provider to call the local or state health department.  Monitor your symptoms Seek prompt medical attention if your illness is worsening (e.g., difficulty breathing). Before going to your medical appointment, call the healthcare provider and tell them that you have, or are being evaluated for, COVID-19 infection. Ask your healthcare provider to call the local or state health department.  Wear a facemask You should wear a facemask that covers your nose and mouth when you are in the same room with other people and when you visit a healthcare provider. People who live with or visit you should also wear a facemask while  they are in the same room with you.  Separate yourself from other people in your home As much as possible, you should stay in a different room from other people in your home. Also, you should use a separate bathroom, if available.  Avoid sharing household items You should not share dishes, drinking glasses, cups, eating utensils, towels, bedding, or other items with other people in your home. After using these items, you should wash them thoroughly with soap and water.  Cover your coughs and sneezes Cover your mouth and nose with a tissue when you cough or sneeze, or you can cough or sneeze into your sleeve. Throw used tissues in a lined trash can, and immediately wash your hands with soap and water for at least 20 seconds or use an alcohol-based hand rub.  Wash your [facility] your hands often and thoroughly with soap and water for at least 20 seconds. You can use an alcohol-based hand sanitizer if soap and water are not available and if your hands are not visibly dirty. Avoid touching your eyes, nose, and mouth with unwashed hands.   Prevention Steps for Caregivers and Household Members of Individuals Confirmed to have, or Being Evaluated for, COVID-19 Infection Being Cared for in the Home  If you live with, or provide care at home for, a person confirmed to have, or being evaluated for, COVID-19 infection please follow these guidelines to prevent infection:  Follow healthcare provider's instructions Make sure that you understand and can help the patient follow any healthcare provider instructions for all care.  Provide for the patient's basic needs You should help the patient with basic needs in the home and provide support for getting groceries, prescriptions, and other personal needs.  Monitor the patient's symptoms If they are getting sicker, call his or her medical provider and tell them that the patient has, or is being evaluated for, COVID-19 infection. This will help  the healthcare provider's office take steps to keep other people from getting infected. Ask the healthcare provider to call the local or state health department.  Limit the number of people who have contact with the patient If possible, have only one caregiver for the patient. Other household members should stay in another home or place of residence. If this is not possible, they should stay in another room, or be separated from the patient as much as possible. Use a separate bathroom, if available. Restrict visitors who do not have an essential need to be in the home.  Keep older adults, very young children, and other sick people away from the patient Keep older adults, very young children, and  those who have compromised immune systems or chronic health conditions away from the patient. This includes people with chronic heart, lung, or kidney conditions, diabetes, and cancer.  Ensure good ventilation Make sure that shared spaces in the home have good air flow, such as from an air conditioner or an opened window, weather permitting.  Wash your hands often Wash your hands often and thoroughly with soap and water for at least 20 seconds. You can use an alcohol based hand sanitizer if soap and water are not available and if your hands are not visibly dirty. Avoid touching your eyes, nose, and mouth with unwashed hands. Use disposable paper towels to dry your hands. If not available, use dedicated cloth towels and replace them when they become wet.  Wear a facemask and gloves Wear a disposable facemask at all times in the room and gloves when you touch or have contact with the patient's blood, body fluids, and/or secretions or excretions, such as sweat, saliva, sputum, nasal mucus, vomit, urine, or feces.  Ensure the mask fits over your nose and mouth tightly, and do not touch it during use. Throw out disposable facemasks and gloves after using them. Do not reuse. Wash your hands immediately  after removing your facemask and gloves. If your personal clothing becomes contaminated, carefully remove clothing and launder. Wash your hands after handling contaminated clothing. Place all used disposable facemasks, gloves, and other waste in a lined container before disposing them with other household waste. Remove gloves and wash your hands immediately after handling these items.  Do not share dishes, glasses, or other household items with the patient Avoid sharing household items. You should not share dishes, drinking glasses, cups, eating utensils, towels, bedding, or other items with a patient who is confirmed to have, or being evaluated for, COVID-19 infection. After the person uses these items, you should wash them thoroughly with soap and water.  Wash laundry thoroughly Immediately remove and wash clothes or bedding that have blood, body fluids, and/or secretions or excretions, such as sweat, saliva, sputum, nasal mucus, vomit, urine, or feces, on them. Wear gloves when handling laundry from the patient. Read and follow directions on labels of laundry or clothing items and detergent. In general, wash and dry with the warmest temperatures recommended on the label.  Clean all areas the individual has used often Clean all touchable surfaces, such as counters, tabletops, doorknobs, bathroom fixtures, toilets, phones, keyboards, tablets, and bedside tables, every day. Also, clean any surfaces that may have blood, body fluids, and/or secretions or excretions on them. Wear gloves when cleaning surfaces the patient has come in contact with. Use a diluted bleach solution (e.g., dilute bleach with 1 part bleach and 10 parts water) or a household disinfectant with a label that says EPA-registered for coronaviruses. To make a bleach solution at home, add 1 tablespoon of bleach to 1 quart (4 cups) of water. For a larger supply, add  cup of bleach to 1 gallon (16 cups) of water. Read labels of  cleaning products and follow recommendations provided on product labels. Labels contain instructions for safe and effective use of the cleaning product including precautions you should take when applying the product, such as wearing gloves or eye protection and making sure you have good ventilation during use of the product. Remove gloves and wash hands immediately after cleaning.  Monitor yourself for signs and symptoms of illness Caregivers and household members are considered close contacts, should monitor their health, and will be asked to  limit movement outside of the home to the extent possible. Follow the monitoring steps for close contacts listed on the symptom monitoring form.   ? If you have additional questions, contact your local health department or call the epidemiologist on call at [phone] (available 24/7). ? This guidance is subject to change. For the most up-to-date guidance from [facility], please refer to their website: YouBlogs.pl   I provided 23 minutes of non-face-to-face time during this encounter.   Fenton Foy, NP

## 2021-02-09 ENCOUNTER — Other Ambulatory Visit: Payer: Self-pay | Admitting: Nurse Practitioner

## 2021-03-18 ENCOUNTER — Other Ambulatory Visit: Payer: Self-pay | Admitting: Nurse Practitioner

## 2021-03-18 DIAGNOSIS — R0602 Shortness of breath: Secondary | ICD-10-CM

## 2021-03-31 ENCOUNTER — Other Ambulatory Visit: Payer: Self-pay | Admitting: Nurse Practitioner

## 2021-04-09 ENCOUNTER — Other Ambulatory Visit: Payer: Self-pay | Admitting: Pharmacist

## 2021-04-09 DIAGNOSIS — I10 Essential (primary) hypertension: Secondary | ICD-10-CM

## 2021-04-11 ENCOUNTER — Ambulatory Visit: Payer: Medicaid Other | Admitting: Nurse Practitioner

## 2021-04-11 MED ORDER — CARVEDILOL 3.125 MG PO TABS: 3.1250 mg | ORAL_TABLET | Freq: Two times a day (BID) | ORAL | 1 refills | Status: DC

## 2021-04-17 ENCOUNTER — Other Ambulatory Visit: Payer: Self-pay

## 2021-04-17 ENCOUNTER — Emergency Department (HOSPITAL_COMMUNITY): Payer: Medicaid Other

## 2021-04-17 ENCOUNTER — Inpatient Hospital Stay (HOSPITAL_COMMUNITY)
Admission: EM | Admit: 2021-04-17 | Discharge: 2021-04-20 | DRG: 189 | Disposition: A | Discharge status: Home Health | Payer: Medicaid Other | Attending: Family Medicine | Admitting: Family Medicine

## 2021-04-17 DIAGNOSIS — Z20822 Contact with and (suspected) exposure to covid-19: Secondary | ICD-10-CM | POA: Diagnosis present

## 2021-04-17 DIAGNOSIS — E785 Hyperlipidemia, unspecified: Secondary | ICD-10-CM | POA: Diagnosis present

## 2021-04-17 DIAGNOSIS — Z7951 Long term (current) use of inhaled steroids: Secondary | ICD-10-CM

## 2021-04-17 DIAGNOSIS — R0603 Acute respiratory distress: Secondary | ICD-10-CM

## 2021-04-17 DIAGNOSIS — G9341 Metabolic encephalopathy: Secondary | ICD-10-CM | POA: Diagnosis not present

## 2021-04-17 DIAGNOSIS — J45909 Unspecified asthma, uncomplicated: Secondary | ICD-10-CM | POA: Diagnosis present

## 2021-04-17 DIAGNOSIS — D72829 Elevated white blood cell count, unspecified: Secondary | ICD-10-CM | POA: Diagnosis present

## 2021-04-17 DIAGNOSIS — G4733 Obstructive sleep apnea (adult) (pediatric): Secondary | ICD-10-CM | POA: Diagnosis present

## 2021-04-17 DIAGNOSIS — M5137 Other intervertebral disc degeneration, lumbosacral region: Secondary | ICD-10-CM | POA: Diagnosis present

## 2021-04-17 DIAGNOSIS — M545 Low back pain, unspecified: Secondary | ICD-10-CM

## 2021-04-17 DIAGNOSIS — I5032 Chronic diastolic (congestive) heart failure: Secondary | ICD-10-CM | POA: Diagnosis present

## 2021-04-17 DIAGNOSIS — G8929 Other chronic pain: Secondary | ICD-10-CM | POA: Diagnosis present

## 2021-04-17 DIAGNOSIS — G894 Chronic pain syndrome: Secondary | ICD-10-CM | POA: Diagnosis present

## 2021-04-17 DIAGNOSIS — R404 Transient alteration of awareness: Secondary | ICD-10-CM | POA: Diagnosis present

## 2021-04-17 DIAGNOSIS — Z8249 Family history of ischemic heart disease and other diseases of the circulatory system: Secondary | ICD-10-CM

## 2021-04-17 DIAGNOSIS — Z7984 Long term (current) use of oral hypoglycemic drugs: Secondary | ICD-10-CM

## 2021-04-17 DIAGNOSIS — E0843 Diabetes mellitus due to underlying condition with diabetic autonomic (poly)neuropathy: Secondary | ICD-10-CM | POA: Diagnosis not present

## 2021-04-17 DIAGNOSIS — Z794 Long term (current) use of insulin: Secondary | ICD-10-CM | POA: Diagnosis not present

## 2021-04-17 DIAGNOSIS — J9622 Acute and chronic respiratory failure with hypercapnia: Secondary | ICD-10-CM | POA: Diagnosis present

## 2021-04-17 DIAGNOSIS — I272 Pulmonary hypertension, unspecified: Secondary | ICD-10-CM | POA: Diagnosis present

## 2021-04-17 DIAGNOSIS — R4182 Altered mental status, unspecified: Secondary | ICD-10-CM

## 2021-04-17 DIAGNOSIS — Z825 Family history of asthma and other chronic lower respiratory diseases: Secondary | ICD-10-CM

## 2021-04-17 DIAGNOSIS — R4189 Other symptoms and signs involving cognitive functions and awareness: Secondary | ICD-10-CM | POA: Diagnosis not present

## 2021-04-17 DIAGNOSIS — D751 Secondary polycythemia: Secondary | ICD-10-CM | POA: Diagnosis present

## 2021-04-17 DIAGNOSIS — Z6841 Body Mass Index (BMI) 40.0 and over, adult: Secondary | ICD-10-CM

## 2021-04-17 DIAGNOSIS — Z30430 Encounter for insertion of intrauterine contraceptive device: Secondary | ICD-10-CM | POA: Diagnosis not present

## 2021-04-17 DIAGNOSIS — E1143 Type 2 diabetes mellitus with diabetic autonomic (poly)neuropathy: Secondary | ICD-10-CM | POA: Diagnosis present

## 2021-04-17 DIAGNOSIS — Z833 Family history of diabetes mellitus: Secondary | ICD-10-CM | POA: Diagnosis not present

## 2021-04-17 DIAGNOSIS — Z87891 Personal history of nicotine dependence: Secondary | ICD-10-CM

## 2021-04-17 DIAGNOSIS — I110 Hypertensive heart disease with heart failure: Secondary | ICD-10-CM | POA: Diagnosis present

## 2021-04-17 DIAGNOSIS — R9089 Other abnormal findings on diagnostic imaging of central nervous system: Secondary | ICD-10-CM | POA: Diagnosis present

## 2021-04-17 DIAGNOSIS — M549 Dorsalgia, unspecified: Secondary | ICD-10-CM | POA: Diagnosis present

## 2021-04-17 DIAGNOSIS — Z801 Family history of malignant neoplasm of trachea, bronchus and lung: Secondary | ICD-10-CM | POA: Diagnosis not present

## 2021-04-17 DIAGNOSIS — J9621 Acute and chronic respiratory failure with hypoxia: Principal | ICD-10-CM | POA: Diagnosis present

## 2021-04-17 DIAGNOSIS — Z79899 Other long term (current) drug therapy: Secondary | ICD-10-CM

## 2021-04-17 LAB — CBC WITH DIFFERENTIAL/PLATELET
Abs Immature Granulocytes: 0.07 10*3/uL (ref 0.00–0.07)
Basophils Absolute: 0.1 10*3/uL (ref 0.0–0.1)
Basophils Relative: 0 %
Eosinophils Absolute: 0.1 10*3/uL (ref 0.0–0.5)
Eosinophils Relative: 1 %
HCT: 48.5 % — ABNORMAL HIGH (ref 36.0–46.0)
Hemoglobin: 15.6 g/dL — ABNORMAL HIGH (ref 12.0–15.0)
Immature Granulocytes: 1 %
Lymphocytes Relative: 12 %
Lymphs Abs: 1.4 10*3/uL (ref 0.7–4.0)
MCH: 25.3 pg — ABNORMAL LOW (ref 26.0–34.0)
MCHC: 32.2 g/dL (ref 30.0–36.0)
MCV: 78.7 fL — ABNORMAL LOW (ref 80.0–100.0)
Monocytes Absolute: 0.4 10*3/uL (ref 0.1–1.0)
Monocytes Relative: 3 %
Neutro Abs: 9.7 10*3/uL — ABNORMAL HIGH (ref 1.7–7.7)
Neutrophils Relative %: 83 %
Platelets: 322 10*3/uL (ref 150–400)
RBC: 6.16 MIL/uL — ABNORMAL HIGH (ref 3.87–5.11)
RDW: 17.5 % — ABNORMAL HIGH (ref 11.5–15.5)
WBC: 11.6 10*3/uL — ABNORMAL HIGH (ref 4.0–10.5)
nRBC: 0.7 % — ABNORMAL HIGH (ref 0.0–0.2)

## 2021-04-17 LAB — COMPREHENSIVE METABOLIC PANEL
ALT: 19 U/L (ref 0–44)
AST: 22 U/L (ref 15–41)
Albumin: 3.2 g/dL — ABNORMAL LOW (ref 3.5–5.0)
Alkaline Phosphatase: 79 U/L (ref 38–126)
Anion gap: 10 (ref 5–15)
BUN: 16 mg/dL (ref 6–20)
CO2: 34 mmol/L — ABNORMAL HIGH (ref 22–32)
Calcium: 9.2 mg/dL (ref 8.9–10.3)
Chloride: 93 mmol/L — ABNORMAL LOW (ref 98–111)
Creatinine, Ser: 1.01 mg/dL — ABNORMAL HIGH (ref 0.44–1.00)
GFR, Estimated: >60 mL/min (ref >60)
Glucose, Bld: 244 mg/dL — ABNORMAL HIGH (ref 70–99)
Potassium: 4.5 mmol/L (ref 3.5–5.1)
Sodium: 137 mmol/L (ref 135–145)
Total Bilirubin: 0.6 mg/dL (ref 0.3–1.2)
Total Protein: 8 g/dL (ref 6.5–8.1)

## 2021-04-17 LAB — URINALYSIS, ROUTINE W REFLEX MICROSCOPIC
Bilirubin Urine: NEGATIVE
Glucose, UA: NEGATIVE mg/dL
Hgb urine dipstick: NEGATIVE
Ketones, ur: NEGATIVE mg/dL
Nitrite: NEGATIVE
Protein, ur: 30 mg/dL — AB
Specific Gravity, Urine: 1.028 (ref 1.005–1.030)
pH: 6 (ref 5.0–8.0)

## 2021-04-17 LAB — I-STAT BETA HCG BLOOD, ED (MC, WL, AP ONLY): I-stat hCG, quantitative: <5 m[IU]/mL (ref <5)

## 2021-04-17 LAB — RESP PANEL BY RT-PCR (FLU A&B, COVID) ARPGX2
Influenza A by PCR: NEGATIVE
Influenza B by PCR: NEGATIVE
SARS Coronavirus 2 by RT PCR: NEGATIVE

## 2021-04-17 LAB — I-STAT VENOUS BLOOD GAS, ED
Acid-Base Excess: 9 mmol/L — ABNORMAL HIGH (ref 0.0–2.0)
Bicarbonate: 41.2 mmol/L — ABNORMAL HIGH (ref 20.0–28.0)
Calcium, Ion: 1.16 mmol/L (ref 1.15–1.40)
HCT: 52 % — ABNORMAL HIGH (ref 36.0–46.0)
Hemoglobin: 17.7 g/dL — ABNORMAL HIGH (ref 12.0–15.0)
O2 Saturation: 72 %
Potassium: 4.3 mmol/L (ref 3.5–5.1)
Sodium: 138 mmol/L (ref 135–145)
TCO2: 44 mmol/L — ABNORMAL HIGH (ref 22–32)
pCO2, Ven: 91.5 mmHg (ref 44.0–60.0)
pH, Ven: 7.261 (ref 7.250–7.430)
pO2, Ven: 46 mmHg — ABNORMAL HIGH (ref 32.0–45.0)

## 2021-04-17 LAB — I-STAT CHEM 8, ED
BUN: 20 mg/dL (ref 6–20)
Calcium, Ion: 1.15 mmol/L (ref 1.15–1.40)
Chloride: 93 mmol/L — ABNORMAL LOW (ref 98–111)
Creatinine, Ser: 1 mg/dL (ref 0.44–1.00)
Glucose, Bld: 236 mg/dL — ABNORMAL HIGH (ref 70–99)
HCT: 55 % — ABNORMAL HIGH (ref 36.0–46.0)
Hemoglobin: 18.7 g/dL — ABNORMAL HIGH (ref 12.0–15.0)
Potassium: 4.4 mmol/L (ref 3.5–5.1)
Sodium: 139 mmol/L (ref 135–145)
TCO2: 38 mmol/L — ABNORMAL HIGH (ref 22–32)

## 2021-04-17 LAB — ETHANOL: Alcohol, Ethyl (B): <10 mg/dL (ref <10)

## 2021-04-17 LAB — BRAIN NATRIURETIC PEPTIDE: B Natriuretic Peptide: 7.3 pg/mL (ref 0.0–100.0)

## 2021-04-17 LAB — PROTIME-INR
INR: 1.1 (ref 0.8–1.2)
Prothrombin Time: 13.8 seconds (ref 11.4–15.2)

## 2021-04-17 LAB — CBG MONITORING, ED: Glucose-Capillary: 195 mg/dL — ABNORMAL HIGH (ref 70–99)

## 2021-04-17 LAB — APTT: aPTT: 26 seconds (ref 24–36)

## 2021-04-17 MED ORDER — MUSCLE RUB 10-15 % EX CREA
1.0000 "application " | TOPICAL_CREAM | CUTANEOUS | Status: DC | PRN
  Filled 2021-04-17: qty 85

## 2021-04-17 MED ORDER — ONDANSETRON HCL 4 MG/2ML IJ SOLN
4.0000 mg | Freq: Four times a day (QID) | INTRAMUSCULAR | Status: DC | PRN
  Administered 2021-04-18 – 2021-04-19 (×2): 4 mg via INTRAVENOUS
  Filled 2021-04-17 (×2): qty 2

## 2021-04-17 MED ORDER — LINACLOTIDE 145 MCG PO CAPS
290.0000 ug | ORAL_CAPSULE | Freq: Every day | ORAL | Status: DC
  Administered 2021-04-18 – 2021-04-20 (×3): 290 ug via ORAL
  Filled 2021-04-17 (×3): qty 2

## 2021-04-17 MED ORDER — LORATADINE 10 MG PO TABS
10.0000 mg | ORAL_TABLET | Freq: Every day | ORAL | Status: DC
  Administered 2021-04-17 – 2021-04-20 (×4): 10 mg via ORAL
  Filled 2021-04-17 (×4): qty 1

## 2021-04-17 MED ORDER — CARVEDILOL 3.125 MG PO TABS
3.1250 mg | ORAL_TABLET | Freq: Two times a day (BID) | ORAL | Status: DC
  Administered 2021-04-17 – 2021-04-20 (×6): 3.125 mg via ORAL
  Filled 2021-04-17 (×6): qty 1

## 2021-04-17 MED ORDER — GABAPENTIN 300 MG PO CAPS
600.0000 mg | ORAL_CAPSULE | Freq: Three times a day (TID) | ORAL | Status: DC
  Administered 2021-04-17 – 2021-04-20 (×9): 600 mg via ORAL
  Filled 2021-04-17 (×9): qty 2

## 2021-04-17 MED ORDER — FUROSEMIDE 10 MG/ML IJ SOLN
60.0000 mg | Freq: Once | INTRAMUSCULAR | Status: AC
  Administered 2021-04-17: 60 mg via INTRAVENOUS
  Filled 2021-04-17: qty 6

## 2021-04-17 MED ORDER — ENOXAPARIN SODIUM 100 MG/ML IJ SOSY
100.0000 mg | PREFILLED_SYRINGE | INTRAMUSCULAR | Status: DC
  Administered 2021-04-17 – 2021-04-20 (×4): 100 mg via SUBCUTANEOUS
  Filled 2021-04-17 (×4): qty 1

## 2021-04-17 MED ORDER — INSULIN GLARGINE-YFGN 100 UNIT/ML ~~LOC~~ SOLN
20.0000 [IU] | Freq: Every day | SUBCUTANEOUS | Status: DC
  Administered 2021-04-17 – 2021-04-19 (×3): 20 [IU] via SUBCUTANEOUS
  Filled 2021-04-17 (×4): qty 0.2

## 2021-04-17 MED ORDER — OXYCODONE HCL 5 MG PO TABS
10.0000 mg | ORAL_TABLET | Freq: Four times a day (QID) | ORAL | Status: DC | PRN
  Administered 2021-04-17 – 2021-04-20 (×8): 10 mg via ORAL
  Filled 2021-04-17 (×8): qty 2

## 2021-04-17 MED ORDER — PANTOPRAZOLE SODIUM 40 MG PO TBEC
40.0000 mg | DELAYED_RELEASE_TABLET | Freq: Every day | ORAL | Status: DC
Start: 2021-04-17 — End: 2021-04-20
  Administered 2021-04-17 – 2021-04-20 (×4): 40 mg via ORAL
  Filled 2021-04-17 (×4): qty 1

## 2021-04-17 MED ORDER — NALOXONE HCL 4 MG/0.1ML NA LIQD
1.0000 | NASAL | Status: DC | PRN
  Filled 2021-04-17: qty 8

## 2021-04-17 MED ORDER — SODIUM CHLORIDE 0.9% FLUSH
3.0000 mL | Freq: Two times a day (BID) | INTRAVENOUS | Status: DC
  Administered 2021-04-17 – 2021-04-20 (×6): 3 mL via INTRAVENOUS

## 2021-04-17 MED ORDER — ACETAMINOPHEN 325 MG PO TABS
650.0000 mg | ORAL_TABLET | Freq: Four times a day (QID) | ORAL | Status: DC | PRN
  Administered 2021-04-18: 650 mg via ORAL
  Filled 2021-04-17 (×2): qty 2

## 2021-04-17 MED ORDER — ONDANSETRON HCL 4 MG PO TABS
4.0000 mg | ORAL_TABLET | Freq: Four times a day (QID) | ORAL | Status: DC | PRN
  Administered 2021-04-20: 4 mg via ORAL
  Filled 2021-04-17: qty 1

## 2021-04-17 MED ORDER — QUETIAPINE FUMARATE 100 MG PO TABS
400.0000 mg | ORAL_TABLET | Freq: Every day | ORAL | Status: DC
  Administered 2021-04-17 – 2021-04-19 (×3): 400 mg via ORAL
  Filled 2021-04-17 (×3): qty 4
  Filled 2021-04-17: qty 1

## 2021-04-17 MED ORDER — ACETAMINOPHEN 650 MG RE SUPP: 650.0000 mg | Freq: Four times a day (QID) | RECTAL | Status: DC | PRN

## 2021-04-17 MED ORDER — ISOSORB DINITRATE-HYDRALAZINE 20-37.5 MG PO TABS
1.0000 | ORAL_TABLET | Freq: Two times a day (BID) | ORAL | Status: DC
  Administered 2021-04-17 – 2021-04-20 (×6): 1 via ORAL
  Filled 2021-04-17 (×8): qty 1

## 2021-04-17 MED ORDER — IOHEXOL 350 MG/ML SOLN
100.0000 mL | Freq: Once | INTRAVENOUS | Status: AC | PRN
  Administered 2021-04-17: 100 mL via INTRAVENOUS

## 2021-04-17 MED ORDER — INSULIN ASPART 100 UNIT/ML IJ SOLN
0.0000 [IU] | Freq: Three times a day (TID) | INTRAMUSCULAR | Status: DC
  Administered 2021-04-18: 1 [IU] via SUBCUTANEOUS
  Administered 2021-04-18: 2 [IU] via SUBCUTANEOUS
  Administered 2021-04-18: 1 [IU] via SUBCUTANEOUS
  Administered 2021-04-19 (×2): 2 [IU] via SUBCUTANEOUS
  Administered 2021-04-20: 1 [IU] via SUBCUTANEOUS
  Administered 2021-04-20: 2 [IU] via SUBCUTANEOUS

## 2021-04-17 MED ORDER — MOMETASONE FURO-FORMOTEROL FUM 200-5 MCG/ACT IN AERO
2.0000 | INHALATION_SPRAY | Freq: Two times a day (BID) | RESPIRATORY_TRACT | Status: DC
  Administered 2021-04-18 – 2021-04-20 (×5): 2 via RESPIRATORY_TRACT
  Filled 2021-04-17 (×2): qty 8.8

## 2021-04-17 MED ORDER — TORSEMIDE 20 MG PO TABS
40.0000 mg | ORAL_TABLET | Freq: Every day | ORAL | Status: DC
  Administered 2021-04-17 – 2021-04-20 (×4): 40 mg via ORAL
  Filled 2021-04-17 (×4): qty 2

## 2021-04-17 NOTE — ED Triage Notes (Signed)
Pt brought in by EMS from home for altered mental status. Pt was found by her husband lying on the bed with shallow respirations. Husband gave pt 4 mg of narcan at home. Pt is prescribed opioids.

## 2021-04-17 NOTE — ED Provider Notes (Addendum)
[facility] EMERGENCY DEPARTMENT Provider Note   CSN: [id number] Arrival date & time: 04/17/21  3235     History Chief Complaint  Patient presents with  . Altered Mental Status    Cynthia Becker is a 50 y.o. female.  HPI  Patient with medical history of morbid obesity, OSA, CHF, diabetes, degenerative disc disease treated with narcotic medicine presents with altered mental status.  Level 5 caveat applies secondary to altered mental status.  History is provided by nursing staff, chart review, EMS.  EMS reported that the patient's husband found her unresponsive this morning, he gave her 4 of Narcan and she woke up.  Patient in the room is awake but not answering questions, just turns and stares me.  Will occasionally nod yes or no to questions but is unclear if she is understanding.  Patient's husband on the phone reports that his wife was normal last night just more tired than normal.  They went to bed around 12:30 AM, husband reports trying to wake her up around 5:30 AM she was unresponsive and breathing shallow.  He gave her Narcan at that time.  He states his wife does not have any history of stroke, blood clots, heart attacks.  She is not on any blood thinners.  Past Medical History:  Diagnosis Date  . Anemia    iv iron treatment dec 2014/ see oncology for this last in dec 2014  . Anxiety   . Arthritis    knee, hands  . Asthma    seasonal related/ albuterol used when uri  . CHF (congestive heart failure) ([place])   . CN (constipation) 09/14/2014  . [place] DISEASE, LUMBOSACRAL SPINE W/RADICULOPATHY 11/01/2009   Qualifier: Diagnosis of  By: Jorene Minors, [place]    . Depression   . Diabetes ([place])   . H/O dizziness    Hx  . Headache(784.0)    otc med prn  . High blood pressure   . HYPERLIPIDEMIA 02/13/2010   Qualifier: Diagnosis of  By: Jorene Minors, Scott    . INSOMNIA 08/24/2008   Qualifier: Diagnosis of  By: Radene Ou MD, [place]    . Morbid obesity  ([place]) 05/24/2008   Qualifier: Diagnosis of  By: Radene Ou MD, [place]    . SVD (spontaneous vaginal delivery)    x 3    Patient Active Problem List   Diagnosis Date Noted  . Acute respiratory failure with hypoxia ([place]) 10/02/2020  . Acute on chronic diastolic CHF (congestive heart failure) ([place]) 04/29/2020  . Essential hypertension 04/25/2020  . Community acquired bacterial pneumonia 04/25/2020  . Class 3 obesity ([place]) 04/25/2020  . Diabetes mellitus due to underlying condition with diabetic autonomic neuropathy, unspecified whether long term insulin use ([place]) 04/24/2020  . Acute respiratory failure ([place]) 04/24/2020  . Asthmatic bronchitis 03/21/2020  . Nocturnal hypoxia 03/21/2020  . OSA (obstructive sleep apnea) 12/22/2019  . Shortness of breath 08/09/2019  . No-show for appointment 06/30/2019  . Morbid obesity with BMI of 60.0-69.9, adult ([place]) 09/14/2014  . Iron deficiency anemia due to chronic blood loss 09/14/2014  . CN (constipation) 09/14/2014  . Postoperative state 04/12/2014  . Uterine fibroid 03/17/2014  . DUB (dysfunctional uterine bleeding) 01/27/2014  . Iron deficiency anemia due to chronic blood loss 04/09/2013  . 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 Menometrorrhagia 04/09/2013  . Left hand pain 09/18/2011  . High blood pressure   . ANEMIA, IRON DEFICIENCY 08/06/2010  . CHOLECYSTECTOMY, HX OF 04/09/2010  . Dyslipidemia 02/13/2010  .  DEGENERATIVE DISC DISEASE, LUMBOSACRAL SPINE W/RADICULOPATHY 11/01/2009  . BACK PAIN, LUMBAR, WITH RADICULOPATHY 10/30/2009  . TRIGGER FINGER 02/03/2009  . Carpal tunnel syndrome 12/21/2008  . Headache(784.0) 11/09/2008  . Depression 10/04/2008  . URI 08/24/2008  . INSOMNIA 08/24/2008  . Asthma 06/06/2008  . Morbid obesity ([place]) 05/24/2008  . ESSENTIAL HYPERTENSION 05/24/2008    Past Surgical History:  Procedure Laterality Date  . ABDOMINAL HYSTERECTOMY  03/13/2014   total   . BILATERAL SALPINGECTOMY Bilateral 04/12/2014    Procedure: BILATERAL SALPINGECTOMY;  Surgeon: Woodroe Mode, MD;  Location: [facility];  Service: Gynecology;  Laterality: Bilateral;  . CARPAL TUNNEL RELEASE  08/30/2011   Procedure: CARPAL TUNNEL RELEASE;  Surgeon: Wynonia Sours, MD;  Location: [facility];  Service: Orthopedics;  Laterality: Right;  . CHOLECYSTECTOMY  10/11   lap choli  . GALLBLADDER SURGERY  04/04/2010  . HYSTEROSCOPY N/A 02/08/2014   Procedure: HYSTEROSCOPY;  Surgeon: Woodroe Mode, MD;  Location: [facility];  Service: Gynecology;  Laterality: N/A;  . HYSTEROSCOPY WITH NOVASURE N/A 02/08/2014   Procedure: ATTEMPTED NOVASURE ABLATION;  Surgeon: Woodroe Mode, MD;  Location: [facility];  Service: Gynecology;  Laterality: N/A;  . INTRAUTERINE DEVICE INSERTION     07/19/2013  . SEPTOPLASTY N/A 09/08/2013   Procedure: SEPTOPLASTY;  Surgeon: Ruby Cola, MD;  Location: [facility];  Service: ENT;  Laterality: N/A;  . SPINE SURGERY    . TONSILLECTOMY    . TONSILLECTOMY AND ADENOIDECTOMY Bilateral 09/08/2013   Procedure: TONSILLECTOMY ;  Surgeon: Ruby Cola, MD;  Location: [facility];  Service: ENT;  Laterality: Bilateral;  . TUBAL LIGATION    . VAGINAL HYSTERECTOMY N/A 04/12/2014   Procedure: LAPAROSCOPIC ASSISTED VAGINAL HYSTERECTOMY;  Surgeon: Woodroe Mode, MD;  Location: [facility];  Service: Gynecology;  Laterality: N/A;  . WISDOM TOOTH EXTRACTION       OB History     Gravida  3   Para  3   Term  3   Preterm  0   AB  0   Living  3      SAB  0   IAB  0   Ectopic  0   Multiple  0   Live Births              Family History  Problem Relation Age of Onset  . Diabetes Mother   . Hypertension Father   . Heart disease Other   . Diabetes Other   . Hypertension Other   . Alcohol abuse Other   . Hypertension Brother   . Asthma Brother   . Hypertension Brother   . Asthma Brother   . COPD Maternal Grandmother   . Lung cancer Maternal Grandmother   . Asthma Paternal Grandmother   . Asthma Paternal  Aunt     Social History   Tobacco Use  . Smoking status: Former    Packs/day: 0.25    Types: Cigarettes    Quit date: 04/24/2020    Years since quitting: 0.9  . Smokeless tobacco: Never  . Tobacco comments:    4-5 cigarettes a day  Vaping Use  . Vaping Use: Never used  Substance Use Topics  . Alcohol use: No  . Drug use: No    Home Medications Prior to Admission medications   Medication Sig Start Date End Date Taking? Authorizing Provider  ACCU-CHEK FASTCLIX LANCETS MISC USE TO CHECK BLOOD SUGAR TWICE DAILY 03/23/18   [provider]  [place]  test strip 1 each by Other route 3 (three) times daily. Use as instructed 01/08/21 01/08/22  [name], NP  albuterol (PROVENTIL) (2.5 MG/3ML) 0.083% nebulizer solution USE 1 VIAL IN NEBULIZER EVERY 6 HOURS AS NEEDED FOR WHEEZING OR SHORTNESS OF BREATH 03/19/21   [name], NP  albuterol (VENTOLIN HFA) 108 (90 Base) MCG/ACT inhaler Inhale 2 puffs into the lungs every 6 (six) hours as needed for wheezing or shortness of breath.    [provider]  carvedilol (COREG) 3.125 MG tablet Take 1 tablet (3.125 mg total) by mouth 2 (two) times daily with a meal. 04/11/21 06/10/21  Patwardhan, Reynold Bowen, MD  cetirizine (ZYRTEC ALLERGY) 10 MG tablet Take 1 tablet (10 mg total) by mouth daily. 01/08/21 01/08/22  [name], NP  cyclobenzaprine (FLEXERIL) 5 MG tablet Take 1 tablet by mouth three times daily as needed for muscle spasm 02/09/21   [name], NP  Fluticasone-Salmeterol (ADVAIR DISKUS) 250-50 MCG/DOSE AEPB Inhale 1 puff into the lungs 2 (two) times daily. 12/02/19   Julian Hy, DO  gabapentin (NEURONTIN) 600 MG tablet Take 1 tablet (600 mg total) by mouth 3 (three) times daily. 01/08/21 01/08/22  [name], NP  glipiZIDE (GLUCOTROL) 10 MG tablet Take 1 tablet (10 mg total) by mouth 2 (two) times daily before a meal. 10/26/20   [name], NP  ibuprofen (ADVIL) 800 MG tablet Take 800 mg by mouth  every 8 (eight) hours as needed. 12/07/20   [provider]  insulin glargine (LANTUS) 100 UNIT/ML Solostar Pen Inject 20 Units into the skin at bedtime. 12/27/20   [name], NP  Insulin Pen Needle (PEN NEEDLES 31GX5/16") 31G X 8 MM MISC 1 pen by Does not apply route at bedtime. 12/27/20   [name], NP  isosorbide-hydrALAZINE (BIDIL) 20-37.5 MG tablet Take 1 tablet by mouth in the morning and at bedtime. 01/14/20   Patwardhan, Reynold Bowen, MD  LINZESS 290 MCG CAPS capsule TAKE 1 CAPSULE BY MOUTH ONCE DAILY BEFORE BREAKFAST 04/02/21   [name], NP  montelukast (SINGULAIR) 10 MG tablet TAKE 1 TABLET BY MOUTH AT BEDTIME 12/05/20   Martyn Ehrich, NP  omeprazole (PRILOSEC) 20 MG capsule Take 1 capsule (20 mg total) by mouth at bedtime. 01/08/21 01/08/22  [name], NP  ondansetron (ZOFRAN) 4 MG tablet TAKE 1 TABLET BY MOUTH EVERY 8 HOURS AS NEEDED FOR NAUSEA FOR VOMITING 04/02/21   [name], NP  Oxycodone HCl 10 MG TABS Take 10 mg by mouth every 6 (six) hours as needed (pain). Taking 3-4 times a day    [provider]  predniSONE (STERAPRED UNI-PAK 21 TAB) 10 MG (21) TBPK tablet Take as directed 01/08/21   [name], NP  QUEtiapine (SEROQUEL) 400 MG tablet Take 400 mg by mouth at bedtime.    [provider]  spironolactone (ALDACTONE) 50 MG tablet Take 1 tablet (50 mg total) by mouth daily. 01/14/20   Patwardhan, Manish J, MD  Torsemide 40 MG TABS Take 40 mg by mouth daily. 01/11/21 04/09/21  [name], NP    Allergies    Patient has no known allergies.  Review of Systems   Review of Systems  Unable to perform ROS: Mental status change   Physical Exam Updated Vital Signs BP (!) 141/122   Pulse (!) 105   Temp 98.6 F (37 C) (Oral)   Resp (!) 22   Ht 5'  5" (1.651 m)   Wt (!) 217 kg   LMP 02/13/2014   SpO2 97%   BMI 79.61 kg/m   Physical Exam Vitals and nursing note reviewed. Exam conducted with a chaperone present.   Constitutional:      General: She is not in acute distress.    Appearance: Normal appearance. She is obese.  HENT:     Head: Normocephalic and atraumatic.  Eyes:     General: No scleral icterus.       Right eye: No discharge.        Left eye: No discharge.     Extraocular Movements: Extraocular movements intact.     Pupils: Pupils are equal, round, and reactive to light.     Comments: Pinpoint pupils  Cardiovascular:     Rate and Rhythm: Regular rhythm. Tachycardia present.     Pulses: Normal pulses.     Heart sounds: Normal heart sounds. No murmur heard.   No friction rub. No gallop.     Comments: Patient rhythm is regular, mildly tachycardic.  Radial pulse, DP, PT 1+ and equal bilaterally Pulmonary:     Effort: Pulmonary effort is normal. Tachypnea present. No respiratory distress.     Breath sounds: Normal breath sounds. No decreased breath sounds.     Comments: Patient is mildly tachypneic, diminished lung sounds secondary to body habitus. Abdominal:     General: Abdomen is flat. Bowel sounds are normal. There is no distension.     Palpations: Abdomen is soft.     Tenderness: There is no abdominal tenderness.  Skin:    General: Skin is warm and dry.     Coloration: Skin is not jaundiced.  Neurological:     Mental Status: She is alert. She is disoriented.     Comments: Turns to look and hears questions but does not verbalize answers.  Nausea that she knows where she is, does not provide additional information.  Unclear her orientation level.    ED Results / Procedures / Treatments   Labs (all labs ordered are listed, but only abnormal results are displayed) Labs Reviewed  COMPREHENSIVE METABOLIC PANEL  CBC WITH DIFFERENTIAL/PLATELET  BRAIN NATRIURETIC PEPTIDE  I-STAT VENOUS BLOOD GAS, ED  CBG MONITORING, ED    EKG None  Radiology No results found.  Procedures .Critical Care E&M Performed by: Sherrill Raring, PA-C  Critical care provider statement:    Critical  care time (minutes): 73.   Critical care start time:  04/17/2021 8:25 AM   Critical care end time:  04/17/2021 10:25 AM   Critical care was necessary to treat or prevent imminent or life-threatening deterioration of the following conditions:  Circulatory failure, CNS failure or compromise and respiratory failure   Critical care was time spent personally by me on the following activities:  Development of treatment plan with patient or surrogate, discussions with consultants, evaluation of patient's response to treatment, examination of patient, pulse oximetry, re-evaluation of patient's condition, review of old charts, ordering and review of radiographic studies, ordering and review of laboratory studies and ordering and performing treatments and interventions   Care discussed with: admitting provider   After initial E/M assessment, critical care services were subsequently performed that were exclusive of separately billable procedures or treatment.     Medications Ordered in ED Medications - No data to display  ED Course  I have reviewed the triage vital signs and the nursing notes.  Pertinent labs & imaging results that were available during my care of  the patient were reviewed by me and considered in my medical decision making (see chart for details).    MDM Rules/Calculators/A&P                           Patient is mildly tachycardic apparently tachypneic, I think this is secondary to being given the Narcan.  Patient hypoxic in room, put on 4 L of nasal cannula.  She is still mildly tachycardic in the 100s and slightly tachypneic.  VBG is notable for hypercapnia of 91.  She does have a slight leukocytosis, no anemia.  BNP within normal limits, patient is not fluid overloaded and I doubt this is a CHF exacerbation.  Patient is now more alert and is speaking.  Her PCO2 is at 91.5, will start on BiPAP.  Her bicarb is elevated and has been for some time, suspect that she became hypoxic  overnight and had a infarct.  CT read is concerning for acute versus subacute left occipital PCA infarct.  We will proceed with MR head without contrast per neuro protocol and will consult neurology for their recommendations.  Last known normal was at 12:30 AM.  I spoke with Dr.  Lorrin Goodell, Who advised getting a CTA of her head and neck, he will see her as a code stroke.  Spoke with Dr. Lorrin Goodell, he advises to admit to medicine and only please patient is a code stroke.  He advises to report the CT scan in 24 hours.  Patient will need admission for respiratory distress, hypercapnia, altered mental status and stroke work-up.  I will consult medicine.  Final Clinical Impression(s) / ED Diagnoses Final diagnoses:  None    Rx / DC Orders ED Discharge Orders     None        Sherrill Raring, PA-C 04/17/21 1008    Sherrill Raring, PA-C 04/17/21 1026    Dorie Rank, MD 04/19/21 [phone]

## 2021-04-17 NOTE — Progress Notes (Signed)
Patient has Bipap PRN order.  Patient has had two sleep studies and did not qualify for an OSA diagnosis.  Patient is currently on 3L [place] with a sat of 94%.  No distress noted at this time.

## 2021-04-17 NOTE — Progress Notes (Signed)
Patient taken to CT on bipap. Patient ripped bipap mask off for CT scan. Bipap was placed back on patient upon arrival back to the room. RT will monitor.

## 2021-04-17 NOTE — H&P (Signed)
History and Physical    Cynthia Becker YPP:[id number] DOB: [birth year] DOA: 04/17/2021  Referring MD/NP/PA: Sherrill Raring, PA-C PCP: [name], NP  Patient coming from: Home via EMS  Chief Complaint: Unresponsiveness  I have personally briefly reviewed patient's old medical records in [place]   HPI: Cynthia Becker is a 50 y.o. female with medical history significant of hypertension, hyperlipidemia, diastolic congestive heart failure, pulmonary hypertension, anxiety, diabetes mellitus type 2, OSA, morbid obesity, and chronic pain who presents after being found unresponsive this morning by her husband .  Her husband helps provide additional history.  He tried to get her up and she was breathing very shallowly, was drenched in sweat, and was not responding to him despite his efforts.  He gave the patient 4 mg of more Narcan without significant response and thereafter called 911.  Patient was last known to be normal at around 12:30 AM this morning.    Patient is currently more responsive and able to give additional history currently while on BiPAP.  She states that she has been in a lot of pain  lately in her back and legs and had issues with leg swelling for which she has had made a follow-up appointment with cardiology.  Last night she took 10 mg of oxycodone couple hours before she took 600 mg of gabapentin, ibuprofen 800 mg , Seroquel, and some other medication as she does not recall the name of prior to going to sleep.  She is supposed to be on 2 L of nasal cannula oxygen at night.  Admits that she was not wearing it last night and is not always compliant with doing so.  She previously has had 2 sleep studies over which were negative for sleep apnea.  Review of records note patient was evaluated in the emergency department in June of this year for suspected accidental overdose thought related with Seroquel.  Denies any recent complaints of chest pain or fever.  She intermittently has a  cough and wheezes from time to time.  Wheezing improves when she uses her nebulizer.  Also notes that she has not routinely been checking her weight.  ED Course: Upon admission into the emergency department patient was seen as a code stroke.  CT scan of the head noted concern for low-attenuation in the left occipital lobe concerning for acute/subacute stroke.  Patient was noted to afebrile, pulse 102-107, respirations 19-30, blood pressures 124/102 -144/122, and O2 saturation maintained on BiPAP after initial venous blood gas noted PCO2 of 91.5.  Neurology had evaluated the patient and recommended repeat CT scan tomorrow morning given inability to obtain MRI at this time.  Plan for further stroke work-up if imaging studies consistent with stroke.  Labs significant for WBC 11.6, hemoglobin 15.6, CO2 34, and BNP 7.3.  Chest x-ray significant for low lung volumes with increased interstitial markings to reflect mild to moderate pulmonary interstitial edema.  Review of Systems  Constitutional:  Negative for fever and malaise/fatigue.  HENT:  Negative for ear discharge and nosebleeds.   Eyes:  Negative for photophobia and pain.  Respiratory:  Positive for shortness of breath. Negative for cough (Intermittently).   Cardiovascular:  Positive for leg swelling. Negative for chest pain.  Gastrointestinal:  Negative for abdominal pain, nausea and vomiting.  Genitourinary:  Negative for dysuria and hematuria.  Musculoskeletal:  Positive for back pain and myalgias.  Skin:  Negative for rash.  Neurological:  Positive for weakness.  Psychiatric/Behavioral:  Negative for substance abuse.  Past Medical History:  Diagnosis Date   Anemia    iv iron treatment dec 2014/ see oncology for this last in dec 2014   Anxiety    Arthritis    knee, hands   Asthma    seasonal related/ albuterol used when uri   CHF (congestive heart failure) (HCC)    CN (constipation) 09/14/2014   DEGENERATIVE DISC DISEASE, LUMBOSACRAL  SPINE W/RADICULOPATHY 11/01/2009   Qualifier: Diagnosis of  By: Jorene Minors, Scott     Depression    Diabetes ([place])    H/O dizziness    Hx   Headache(784.0)    otc med prn   High blood pressure    HYPERLIPIDEMIA 02/13/2010   Qualifier: Diagnosis of  By: Jorene Minors, Scott     INSOMNIA 08/24/2008   Qualifier: Diagnosis of  By: Radene Ou MD, [place]     Morbid obesity ([place]) 05/24/2008   Qualifier: Diagnosis of  By: Radene Ou MD, [place]     SVD (spontaneous vaginal delivery)    x 3    Past Surgical History:  Procedure Laterality Date   ABDOMINAL HYSTERECTOMY  03/13/2014   total    BILATERAL SALPINGECTOMY Bilateral 04/12/2014   Procedure: BILATERAL SALPINGECTOMY;  Surgeon: Woodroe Mode, MD;  Location: [facility];  Service: Gynecology;  Laterality: Bilateral;   CARPAL TUNNEL RELEASE  08/30/2011   Procedure: CARPAL TUNNEL RELEASE;  Surgeon: Wynonia Sours, MD;  Location: [facility];  Service: Orthopedics;  Laterality: Right;   CHOLECYSTECTOMY  10/11   lap choli   GALLBLADDER SURGERY  04/04/2010   HYSTEROSCOPY N/A 02/08/2014   Procedure: HYSTEROSCOPY;  Surgeon: Woodroe Mode, MD;  Location: [facility];  Service: Gynecology;  Laterality: N/A;   HYSTEROSCOPY WITH NOVASURE N/A 02/08/2014   Procedure: ATTEMPTED NOVASURE ABLATION;  Surgeon: Woodroe Mode, MD;  Location: [facility];  Service: Gynecology;  Laterality: N/A;   INTRAUTERINE DEVICE INSERTION     07/19/2013   SEPTOPLASTY N/A 09/08/2013   Procedure: SEPTOPLASTY;  Surgeon: Ruby Cola, MD;  Location: [facility];  Service: ENT;  Laterality: N/A;   SPINE SURGERY     TONSILLECTOMY     TONSILLECTOMY AND ADENOIDECTOMY Bilateral 09/08/2013   Procedure: TONSILLECTOMY ;  Surgeon: Ruby Cola, MD;  Location: [facility];  Service: ENT;  Laterality: Bilateral;   TUBAL LIGATION     VAGINAL HYSTERECTOMY N/A 04/12/2014   Procedure: LAPAROSCOPIC ASSISTED VAGINAL HYSTERECTOMY;  Surgeon: Woodroe Mode, MD;  Location: [facility];  Service: Gynecology;   Laterality: N/A;   WISDOM TOOTH EXTRACTION       reports that she quit smoking about a year ago. Her smoking use included cigarettes. She smoked an average of .25 packs per day. She has never used smokeless tobacco. She reports that she does not drink alcohol and does not use drugs.  No Known Allergies  Family History  Problem Relation Age of Onset   Diabetes Mother    Hypertension Father    Heart disease Other    Diabetes Other    Hypertension Other    Alcohol abuse Other    Hypertension Brother    Asthma Brother    Hypertension Brother    Asthma Brother    COPD Maternal Grandmother    Lung cancer Maternal Grandmother    Asthma Paternal Grandmother    Asthma Paternal Aunt     Prior to Admission medications   Medication Sig Start Date End Date Taking? Authorizing Provider  ACCU-CHEK FASTCLIX LANCETS MISC USE TO  CHECK BLOOD SUGAR TWICE DAILY 03/23/18   [provider]  ACCU-CHEK GUIDE test strip 1 each by Other route 3 (three) times daily. Use as instructed 01/08/21 01/08/22  [name], NP  albuterol (PROVENTIL) (2.5 MG/3ML) 0.083% nebulizer solution USE 1 VIAL IN NEBULIZER EVERY 6 HOURS AS NEEDED FOR WHEEZING OR SHORTNESS OF BREATH 03/19/21   [name], NP  albuterol (VENTOLIN HFA) 108 (90 Base) MCG/ACT inhaler Inhale 2 puffs into the lungs every 6 (six) hours as needed for wheezing or shortness of breath.    [provider]  carvedilol (COREG) 3.125 MG tablet Take 1 tablet (3.125 mg total) by mouth 2 (two) times daily with a meal. 04/11/21 06/10/21  Patwardhan, Reynold Bowen, MD  cetirizine (ZYRTEC ALLERGY) 10 MG tablet Take 1 tablet (10 mg total) by mouth daily. 01/08/21 01/08/22  [name], NP  cyclobenzaprine (FLEXERIL) 5 MG tablet Take 1 tablet by mouth three times daily as needed for muscle spasm 02/09/21   [name], NP  Fluticasone-Salmeterol (ADVAIR DISKUS) 250-50 MCG/DOSE AEPB Inhale 1 puff into the lungs 2 (two) times daily. 12/02/19   Julian Hy, DO  gabapentin (NEURONTIN) 600 MG tablet Take 1 tablet (600 mg total) by mouth 3 (three) times daily. 01/08/21 01/08/22  [name], NP  glipiZIDE (GLUCOTROL) 10 MG tablet Take 1 tablet (10 mg total) by mouth 2 (two) times daily before a meal. 10/26/20   [name], NP  ibuprofen (ADVIL) 800 MG tablet Take 800 mg by mouth every 8 (eight) hours as needed. 12/07/20   [provider]  insulin glargine (LANTUS) 100 UNIT/ML Solostar Pen Inject 20 Units into the skin at bedtime. 12/27/20   [name], NP  Insulin Pen Needle (PEN NEEDLES 31GX5/16") 31G X 8 MM MISC 1 pen by Does not apply route at bedtime. 12/27/20   [name], NP  isosorbide-hydrALAZINE (BIDIL) 20-37.5 MG tablet Take 1 tablet by mouth in the morning and at bedtime. 01/14/20   Patwardhan, Reynold Bowen, MD  LINZESS 290 MCG CAPS capsule TAKE 1 CAPSULE BY MOUTH ONCE DAILY BEFORE BREAKFAST 04/02/21   [name], NP  montelukast (SINGULAIR) 10 MG tablet TAKE 1 TABLET BY MOUTH AT BEDTIME 12/05/20   Martyn Ehrich, NP  omeprazole (PRILOSEC) 20 MG capsule Take 1 capsule (20 mg total) by mouth at bedtime. 01/08/21 01/08/22  [name], NP  ondansetron (ZOFRAN) 4 MG tablet TAKE 1 TABLET BY MOUTH EVERY 8 HOURS AS NEEDED FOR NAUSEA FOR VOMITING 04/02/21   [name], NP  Oxycodone HCl 10 MG TABS Take 10 mg by mouth every 6 (six) hours as needed (pain). Taking 3-4 times a day    [provider]  predniSONE (STERAPRED UNI-PAK 21 TAB) 10 MG (21) TBPK tablet Take as directed 01/08/21   [name], NP  QUEtiapine (SEROQUEL) 400 MG tablet Take 400 mg by mouth at bedtime.    [provider]  spironolactone (ALDACTONE) 50 MG tablet Take 1 tablet (50 mg total) by mouth daily. 01/14/20   Patwardhan, Manish J, MD  Torsemide 40 MG TABS Take 40 mg by mouth daily. 01/11/21 04/09/21  [name], NP    Physical Exam:  Constitutional: Morbidly obese female who appears to be in no acute distress at  this time Vitals:   04/17/21 0635 04/17/21 0715 04/17/21 0833 04/17/21 0917  BP: (!) 141/122 128/68 (!) 124/100   Pulse:   (!) 107  100  Resp:  20 19 (!) 30  Temp:      TempSrc:      SpO2:  95% 98% 100%  Weight:      Height:       Eyes: PERRL, lids and conjunctivae normal ENMT: Mucous membranes are moist. Posterior pharynx clear of any exudate or lesions.  Neck: normal, supple, no masses, no thyromegaly Respiratory: Normal respiratory effort with decreased overall aeration currently on BiPAP.  No significant wheezes or rhonchi appreciated at this time. Cardiovascular: Regular rate and rhythm, no murmurs / rubs / gallops.  +1 pitting lower extremity edema. 2+ pedal pulses. No carotid bruits.  Abdomen: no tenderness, no masses palpated. No hepatosplenomegaly. Bowel sounds positive.  Musculoskeletal: no clubbing / cyanosis. No joint deformity upper and lower extremities. Good ROM, no contractures. Normal muscle tone.  Skin: no rashes, lesions, ulcers. No induration Neurologic: CN 2-12 grossly intact. Sensation intact, DTR normal. Strength 5/5 in all 4.  Psychiatric: Normal judgment and insight. Alert and oriented x 3. Normal mood.     Labs on Admission: I have personally reviewed following labs and imaging studies  CBC: Recent Labs  Lab 04/17/21 0636 04/17/21 0727 04/17/21 0830  WBC 11.6*  --   --   NEUTROABS 9.7*  --   --   HGB 15.6* 17.7* 18.7*  HCT 48.5* 52.0* 55.0*  MCV 78.7*  --   --   PLT 322  --   --    Basic Metabolic Panel: Recent Labs  Lab 04/17/21 0636 04/17/21 0727 04/17/21 0830  NA 137 138 139  K 4.5 4.3 4.4  CL 93*  --  93*  CO2 34*  --   --   GLUCOSE 244*  --  236*  BUN 16  --  20  CREATININE 1.01*  --  1.00  CALCIUM 9.2  --   --    GFR: Estimated Creatinine Clearance: 127.1 mL/min (by C-G formula based on SCr of 1 mg/dL). Liver Function Tests: Recent Labs  Lab 04/17/21 0636  AST 22  ALT 19  ALKPHOS 79  BILITOT 0.6  PROT 8.0  ALBUMIN 3.2*    No results for input(s): LIPASE, AMYLASE in the last 168 hours. No results for input(s): AMMONIA in the last 168 hours. Coagulation Profile: No results for input(s): INR, PROTIME in the last 168 hours. Cardiac Enzymes: No results for input(s): CKTOTAL, CKMB, CKMBINDEX, TROPONINI in the last 168 hours. BNP (last 3 results) No results for input(s): PROBNP in the last 8760 hours. HbA1C: No results for input(s): HGBA1C in the last 72 hours. CBG: Recent Labs  Lab 04/17/21 0644  GLUCAP 195*   Lipid Profile: No results for input(s): CHOL, HDL, LDLCALC, TRIG, CHOLHDL, LDLDIRECT in the last 72 hours. Thyroid Function Tests: No results for input(s): TSH, T4TOTAL, FREET4, T3FREE, THYROIDAB in the last 72 hours. Anemia Panel: No results for input(s): VITAMINB12, FOLATE, FERRITIN, TIBC, IRON, RETICCTPCT in the last 72 hours. Urine analysis:    Component Value Date/Time   COLORURINE YELLOW 02/21/2012 1647   APPEARANCEUR CLOUDY (A) 02/21/2012 1647   LABSPEC 1.021 02/21/2012 1647   PHURINE 6.0 02/21/2012 1647   GLUCOSEU NEGATIVE 02/21/2012 1647   HGBUR NEGATIVE 02/21/2012 1647   HGBUR negative 08/02/2010 1448   BILIRUBINUR neg 04/19/2020 1454   KETONESUR NEGATIVE 02/21/2012 1647   PROTEINUR Negative 04/19/2020 1454   PROTEINUR NEGATIVE 02/21/2012 1647   UROBILINOGEN 0.2 04/19/2020 1454   UROBILINOGEN 1.0 02/21/2012 1647   NITRITE neg 04/19/2020 1454  NITRITE NEGATIVE 02/21/2012 1647   LEUKOCYTESUR Negative 04/19/2020 1454   Sepsis Labs: Recent Results (from the past 240 hour(s))  Resp Panel by RT-PCR (Flu A&B, Covid) Nasopharyngeal Swab     Status: None   Collection Time: 04/17/21  8:21 AM   Specimen: Nasopharyngeal Swab; Nasopharyngeal(NP) swabs in vial transport medium  Result Value Ref Range Status   SARS Coronavirus 2 by RT PCR NEGATIVE NEGATIVE Final    Comment: (NOTE) SARS-CoV-2 target nucleic acids are NOT DETECTED.  The SARS-CoV-2 RNA is generally detectable in upper  respiratory specimens during the acute phase of infection. The lowest concentration of SARS-CoV-2 viral copies this assay can detect is 138 copies/mL. A negative result does not preclude SARS-Cov-2 infection and should not be used as the sole basis for treatment or other patient management decisions. A negative result may occur with  improper specimen collection/handling, submission of specimen other than nasopharyngeal swab, presence of viral mutation(s) within the areas targeted by this assay, and inadequate number of viral copies(<138 copies/mL). A negative result must be combined with clinical observations, patient history, and epidemiological information. The expected result is Negative.  Fact Sheet for Patients:  EntrepreneurPulse.com.au  Fact Sheet for Healthcare Providers:  IncredibleEmployment.be  This test is no t yet approved or cleared by the [place] FDA and  has been authorized for detection and/or diagnosis of SARS-CoV-2 by FDA under an Emergency Use Authorization (EUA). This EUA will remain  in effect (meaning this test can be used) for the duration of the COVID-19 declaration under Section 564(b)(1) of the Act, 21 U.S.C.section 360bbb-3(b)(1), unless the authorization is terminated  or revoked sooner.       Influenza A by PCR NEGATIVE NEGATIVE Final   Influenza B by PCR NEGATIVE NEGATIVE Final    Comment: (NOTE) The Xpert Xpress SARS-CoV-2/FLU/RSV plus assay is intended as an aid in the diagnosis of influenza from Nasopharyngeal swab specimens and should not be used as a sole basis for treatment. Nasal washings and aspirates are unacceptable for Xpert Xpress SARS-CoV-2/FLU/RSV testing.  Fact Sheet for Patients: EntrepreneurPulse.com.au  Fact Sheet for Healthcare Providers: IncredibleEmployment.be  This test is not yet approved or cleared by the [place] FDA and has been  authorized for detection and/or diagnosis of SARS-CoV-2 by FDA under an Emergency Use Authorization (EUA). This EUA will remain in effect (meaning this test can be used) for the duration of the COVID-19 declaration under Section 564(b)(1) of the Act, 21 U.S.C. section 360bbb-3(b)(1), unless the authorization is terminated or revoked.  Performed at [facility], [place] [address] [place], [place] [address]      Radiological Exams on Admission: CT Head Wo Contrast  Result Date: 04/17/2021 CLINICAL DATA:  50 year old female with history of altered mental status. EXAM: CT HEAD WITHOUT CONTRAST TECHNIQUE: Contiguous axial images were obtained from the base of the skull through the vertex without intravenous contrast. COMPARISON:  No priors. FINDINGS: Brain: Ill-defined low-attenuation in the left occipital lobe with loss of gray-white differentiation, concerning for acute or subacute left occipital infarct. No evidence of acute hemorrhage, hydrocephalus, extra-axial collection or mass lesion/mass effect. Vascular: No hyperdense vessel or unexpected calcification. Skull: Normal. Negative for fracture or focal lesion. Sinuses/Orbits: No acute finding. Other: None. IMPRESSION: 1. Low-attenuation in the left occipital region concerning for acute or subacute PCA territory infarct. Further evaluation with brain MRI is recommended at this time 2 confirm suspected infarct. Critical Value/emergent results were called by telephone at the time of interpretation on  04/17/2021 at 7:56 am to provider HALEY SAGE, who verbally acknowledged these results. Electronically Signed   By: Vinnie Langton M.D.   On: 04/17/2021 08:01   DG Chest Portable 1 View  Result Date: 04/17/2021 CLINICAL DATA:  Shortness of breath for 1 day EXAM: PORTABLE CHEST 1 VIEW COMPARISON:  Chest radiograph 10/02/2020 FINDINGS: The cardiomediastinal silhouette is within normal limits. Lung volumes are markedly diminished. There are  increased interstitial markings throughout both lungs. There is no focal lobar consolidation. There is no definite pleural effusion. There is no appreciable pneumothorax. There is no acute osseous abnormality. IMPRESSION: Low lung volumes with increased interstitial markings they return fleck mild-to-moderate pulmonary interstitial edema. Electronically Signed   By: Valetta Mole M.D.   On: 04/17/2021 08:43   CT ANGIO HEAD NECK W WO CM (CODE STROKE)  Result Date: 04/17/2021 CLINICAL DATA:  Altered mental status (AMS), unclear cause EXAM: CT ANGIOGRAPHY HEAD AND NECK TECHNIQUE: Multidetector CT imaging of the head and neck was performed using the standard protocol during bolus administration of intravenous contrast. Multiplanar CT image reconstructions and MIPs were obtained to evaluate the vascular anatomy. Carotid stenosis measurements (when applicable) are obtained utilizing NASCET criteria, using the distal internal carotid diameter as the denominator. CONTRAST:  120mL OMNIPAQUE IOHEXOL 350 MG/ML SOLN COMPARISON:  Same day CT head. FINDINGS: CTA NECK FINDINGS Aortic arch: Very limited evaluation due to patient body habitus and associated beam hardening artifact. Great vessel origins appear to be patent. Enlarged main pulmonary artery. Right carotid system: Limited evaluation proximally due to beam hardening artifact. The proximal common carotid artery appears to be grossly patent. No significant (greater than 50%) stenosis of the carotid bifurcation or ICA. Retropharyngeal course. Left carotid system: Limited evaluation proximally due to beam hardening artifact. The proximal common carotid artery appears to be grossly patent. No significant (greater than 50%) stenosis of the carotid bifurcation or ICA. Retropharyngeal course. Vertebral arteries: Nondiagnostic evaluation of the lower and mid vertebral arteries neck due to motion and beam hardening artifact. Patent vertebral arteries in the upper Skeleton:  Limited evaluation due to beam hardening and motion artifact. Vertebral body heights appear to be grossly maintained. Other neck: Limited evaluation without obvious acute abnormality. Adenoid and tonsillar hypertrophy with narrowing of the nasopharyngeal airway. Upper chest: Motion limited evaluation. Mild ill-defined opacities in lateral right upper lobe. Review of the MIP images confirms the above findings CTA HEAD FINDINGS Limited by venous contamination. Anterior circulation: Bilateral intracranial ICAs, MCAs and ACAs are patent without proximal hemodynamically significant stenosis. No aneurysm identified. Posterior circulation: Bilateral intradural vertebral arteries, basilar artery, posterior cerebral arteries are patent without proximal hemodynamically significant stenosis. Left posterior communicating artery, anatomic variant. No aneurysm identified. Venous sinuses: No evidence of dural venous sinus thrombosis. Review of the MIP images confirms the above findings IMPRESSION: CTA head: No large vessel occlusion or proximal hemodynamically significant stenosis. Limited distal evaluation due to venous contamination. CTA neck: 1. Significantly limited evaluation in the lower and mid neck due to beam hardening and motion artifact. No significant (greater than 50%) stenosis at the carotid bifurcations or involving the ICAs. Nondiagnostic evaluation of the lower and mid vertebral arteries with patent vertebral arteries in the upper neck. 2. Motion limited evaluation of the lungs with mild ill-defined opacities in the imaged lateral right upper lobe, potentially infectious/inflammatory. 3. Enlarged main pulmonary artery, which can be seen with pulmonary arterial hypertension. 4. Adenoid and tonsillar hypertrophy with narrowing of the nasopharyngeal airway. Electronically Signed   By:  Margaretha Sheffield M.D.   On: 04/17/2021 09:23    EKG: Independently reviewed.  Sinus tachycardia 106 bpm with low  voltage  Assessment/Plan Unresponsiveness secondary acute on chronic respiratory failure with hypoxia and hypercapnia: Patient was found to be unresponsive by her husband this morning.  She is supposed to be on 2 L nasal cannula oxygen at night, but did not use it last night.  Also notes taking oxycodone, ibuprofen, Seroquel, and gabapentin prior to going to bed.  On admission his blood gas was significant for PCO2 of 91.5.  Suspect symptoms likely related with polypharmacy.  Patient had previously had an accidental overdose earlier this year with Seroquel. -Admit to a progressive bed -Continuous pulse oximetry with oxygen to maintain O2 saturations -BiPAP -N.p.o. until weaned off of BiPAP  Leukocytosis: WBC elevated 11.6.  No other infectious symptoms appreciated.  Urinalysis was not significantly suggestive of UTI.  Elevated white blood cell count possibly reactive in nature. -Recheck CBC in a.m.  Abnormal CT brain: On admission initial CT scan of the brain noted to left hypoattenuation of the left occipital lobe concerning for an acute/subacute PCA territory infarct.  Neurology have been formally consulted and recommended repeat CT scan in am.   -Neuro checks -Recheck CT scan of the brain in a.m. -Appreciate neurology consultative services, we will follow-up for any further recommendations  Diastolic congestive heart failure: Chronic.  Patient reports that she has had increased lower extremity swelling recently.  BNP was within normal limits at 7.3.  Last echocardiogram revealed EF of 60 to 65% in 09/2020.  She has missed possibly 2 doses of torsemide. -Strict I&Os -Daily weights -Give Lasix 60 mg IV x1 dose, and then resume home regimen of Torsemide in a.m. -Continue beta-blocker and BiDil  Diabetes mellitus type 2 with neuropathy: On admission glucose was noted to be 244.  As hemoglobin A1c was 7.2 on 10/02/2020.  Home medication regimen includes Lantus 20 units nightly. -Hypoglycemic  protocol -Continue gabapentin -Continue Lantus 20 units nightly -CBGs before every meal with sensitive SSI -Adjust insulin regimen as needed  Essential hypertension: Home blood pressure medications include Coreg 3.125 mg twice daily with meals, BiDil 20-37.5 mg twice daily, and torsemide 40 mg daily. -Continue Coreg and BiDil -Resume torsemide tomorrow  Polycythemia: Hemoglobin elevated at 15.6 mg/dL with low MCV and MCH.  Thought to likely be secondary to history of sleep apnea. -Recheck normal CBC tomorrow -May warrant further investigation  Chronic back pain on chronic opioid: Patient normally is on oxycodone 10 mg every 4 hours. -Decreased oxycodone to every 6 hours as needed for pain  Sleep apnea: Patient reports that she has had 2 prior sleep studies for which she was told that she did not require CPAP.  She is on 2 L of nasal cannula oxygen at night -Continue nasal cannula oxygen at night  Morbid obesity: BMI 79.61 kg/m -Continue to counsel on need of weight loss with dietary and lifestyle modifications  DVT prophylaxis: Lovenox Code Status: Full Family Communication: Husband updated at bedside Disposition Plan: Likely discharge home tomorrow if work-up otherwise negative. Consults called: Neurology Admission status: Inpatient, likely require more than 2 midnight stay for acute respiratory failure with hypoxia and hypercapnia  Norval Morton MD [facility]   If 7PM-7AM, please contact night-coverage   04/17/2021, 10:04 AM

## 2021-04-17 NOTE — ED Notes (Signed)
Pt transferred onto bariatric hospital bed.  Pt continues to struggle to use pure wick and is up at bedside commode

## 2021-04-17 NOTE — Consult Note (Signed)
NEUROLOGY CONSULTATION NOTE   Date of service: April 17, 2021 Patient Name: Cynthia Becker MRN:  [id number] DOB:  [birth year] Reason for consult: "Stroke code with concern for potential L PCA stroke" Requesting Provider: Dorie Rank, MD _ _ _   _ __   _ __ _ _  __ __   _ __   __ _  History of Present Illness  Cynthia Becker is a 50 y.o. female with PMH significant for CHF, asthma, anxiety, depression, DM2, HTN, morbid obesity, HLD who presents with acute onset AMS and shallow breathing. Husband found her confused at home this AM. He gave her narcan and called EMS. Patient is prescribed opiods. CTH here with potential concern for acute/subacute left occipital stroke.  She is mostly bedbound at baseline 2/2 morbid obesity but is awake, alert and able to communicate, watch TV. She went to bed at [place] on 04/17/21 and was doing fine. She feels foggy and short of breath. VBG with elevated PCO2. She is currently on Bipap and this combined with SOB, limits the history that can be obtained.  mRS: 4 tNK: Not a candidate as she is outside window. Thrombectomy: not a candidate 2/2 no LVO NIHSS components Score: Comment  1a Level of Conscious 0[x]  1[]  2[]  3[]      1b LOC Questions 0[x]  1[]  2[]       1c LOC Commands 0[x]  1[]  2[]       2 Best Gaze 0[x]  1[]  2[]       3 Visual 0[x]  1[]  2[]  3[]      4 Facial Palsy 0[x]  1[]  2[]  3[]      5a Motor Arm - left 0[x]  1[]  2[]  3[]  4[]  UN[]    5b Motor Arm - Right 0[x]  1[]  2[]  3[]  4[]  UN[]    6a Motor Leg - Left 0[]  1[]  2[x]  3[]  4[]  UN[]    6b Motor Leg - Right 0[]  1[]  2[x]  3[]  4[]  UN[]    7 Limb Ataxia 0[x]  1[]  2[]  3[]  UN[]     8 Sensory 0[x]  1[]  2[]  UN[]      9 Best Language 0[x]  1[]  2[]  3[]      10 Dysarthria 0[x]  1[]  2[]  UN[]      11 Extinct. and Inattention 0[x]  1[]  2[]       TOTAL: 4      ROS   Constitutional Denies weight loss, fever and chills.   HEENT Denies changes in vision and hearing.   Respiratory Denies SOB and cough.   CV Denies palpitations and CP   GI  Denies abdominal pain, nausea, vomiting and diarrhea.   GU Denies dysuria and urinary frequency.   MSK Denies myalgia and joint pain.   Skin Denies rash and pruritus.   Neurological Denies headache and syncope.   Psychiatric Denies recent changes in mood. Denies anxiety and depression.    Past History   Past Medical History:  Diagnosis Date   Anemia    iv iron treatment dec 2014/ see oncology for this last in dec 2014   Anxiety    Arthritis    knee, hands   Asthma    seasonal related/ albuterol used when uri   CHF (congestive heart failure) (HCC)    CN (constipation) 09/14/2014   DEGENERATIVE DISC DISEASE, LUMBOSACRAL SPINE W/RADICULOPATHY 11/01/2009   Qualifier: Diagnosis of  By: Jorene Minors, [place]     Depression    Diabetes ([place])    H/O dizziness    Hx   Headache(784.0)    otc med prn   High blood pressure    HYPERLIPIDEMIA  02/13/2010   Qualifier: Diagnosis of  By: Versie Starks     INSOMNIA 08/24/2008   Qualifier: Diagnosis of  By: Radene Ou MD, [place]     Morbid obesity ([place]) 05/24/2008   Qualifier: Diagnosis of  By: Radene Ou MD, [place]     SVD (spontaneous vaginal delivery)    x 3   Past Surgical History:  Procedure Laterality Date   ABDOMINAL HYSTERECTOMY  03/13/2014   total    BILATERAL SALPINGECTOMY Bilateral 04/12/2014   Procedure: BILATERAL SALPINGECTOMY;  Surgeon: Woodroe Mode, MD;  Location: [facility];  Service: Gynecology;  Laterality: Bilateral;   CARPAL TUNNEL RELEASE  08/30/2011   Procedure: CARPAL TUNNEL RELEASE;  Surgeon: Wynonia Sours, MD;  Location: [facility];  Service: Orthopedics;  Laterality: Right;   CHOLECYSTECTOMY  10/11   lap choli   GALLBLADDER SURGERY  04/04/2010   HYSTEROSCOPY N/A 02/08/2014   Procedure: HYSTEROSCOPY;  Surgeon: Woodroe Mode, MD;  Location: [facility];  Service: Gynecology;  Laterality: N/A;   HYSTEROSCOPY WITH NOVASURE N/A 02/08/2014   Procedure: ATTEMPTED NOVASURE ABLATION;  Surgeon: Woodroe Mode, MD;   Location: [facility];  Service: Gynecology;  Laterality: N/A;   INTRAUTERINE DEVICE INSERTION     07/19/2013   SEPTOPLASTY N/A 09/08/2013   Procedure: SEPTOPLASTY;  Surgeon: Ruby Cola, MD;  Location: [facility];  Service: ENT;  Laterality: N/A;   SPINE SURGERY     TONSILLECTOMY     TONSILLECTOMY AND ADENOIDECTOMY Bilateral 09/08/2013   Procedure: TONSILLECTOMY ;  Surgeon: Ruby Cola, MD;  Location: [facility];  Service: ENT;  Laterality: Bilateral;   TUBAL LIGATION     VAGINAL HYSTERECTOMY N/A 04/12/2014   Procedure: LAPAROSCOPIC ASSISTED VAGINAL HYSTERECTOMY;  Surgeon: Woodroe Mode, MD;  Location: [facility];  Service: Gynecology;  Laterality: N/A;   WISDOM TOOTH EXTRACTION     Family History  Problem Relation Age of Onset   Diabetes Mother    Hypertension Father    Heart disease Other    Diabetes Other    Hypertension Other    Alcohol abuse Other    Hypertension Brother    Asthma Brother    Hypertension Brother    Asthma Brother    COPD Maternal Grandmother    Lung cancer Maternal Grandmother    Asthma Paternal Grandmother    Asthma Paternal Aunt    Social History   Socioeconomic History   Marital status: Married    Spouse name: Not on file   Number of children: 3   Years of education: Not on file   Highest education level: Not on file  Occupational History   Not on file  Tobacco Use   Smoking status: Former    Packs/day: 0.25    Types: Cigarettes    Quit date: 04/24/2020    Years since quitting: 0.9   Smokeless tobacco: Never   Tobacco comments:    4-5 cigarettes a day  Vaping Use   Vaping Use: Never used  Substance and Sexual Activity   Alcohol use: No   Drug use: No   Sexual activity: Not on file  Other Topics Concern   Not on file  Social History Narrative   Not on file   Social Determinants of Health   Financial Resource Strain: Not on file  Food Insecurity: Not on file  Transportation Needs: Not on file  Physical Activity: Not on file  Stress: Not on file   Social Connections: Not on file   No  Known Allergies  Medications  (Not in a hospital admission)    Vitals   Vitals:   04/17/21 0633 04/17/21 0635 04/17/21 0715 04/17/21 0833  BP:  (!) 141/122 128/68 (!) 124/100  Pulse:    (!) 107  Resp:   20 19  Temp:      TempSrc:      SpO2:   95% 98%  Weight: (!) 217 kg     Height: 5\' 5"  (1.651 m)        Body mass index is 79.61 kg/m.  Physical Exam   General: Laying comfortably in bed; in no acute distress.  HENT: Normal oropharynx and mucosa. Normal external appearance of ears and nose.  Neck: Supple, no pain or tenderness  CV: No JVD. No peripheral edema.  Pulmonary: Symmetric Chest rise. Normal respiratory effort.  Abdomen: Soft to touch, non-tender.  Ext: No cyanosis, edema, or deformity  Skin: No rash. Normal palpation of skin.   Musculoskeletal: Normal digits and nails by inspection. No clubbing.   Neurologic Examination  Mental status/Cognition: Alert, oriented to self, place, month and year, good attention.  Speech/language: Fluent, comprehension intact, object naming intact, repetition intact.  Cranial nerves:   CN II Pupils equal and reactive to light, no VF deficits    CN III,IV,VI EOM intact, no gaze preference or deviation, no nystagmus    CN V normal sensation in V1, V2, and V3 segments bilaterally    CN VII no asymmetry, no nasolabial fold flattening    CN VIII normal hearing to speech    CN IX & X normal palatal elevation, no uvular deviation    CN XI 5/5 head turn and 5/5 shoulder shrug bilaterally    CN XII midline tongue protrusion    Motor:  Muscle bulk: poor, tone normal, pronator drift none tremor none Mvmt Root Nerve  Muscle Right Left Comments  SA C5/6 Ax Deltoid 5 5   EF C5/6 Mc Biceps 5 5   EE C6/7/8 Rad Triceps 5 5   WF C6/7 Med FCR     WE C7/8 PIN ECU     F Ab C8/T1 U ADM/FDI 5 5   HF L1/2/3 Fem Illopsoas 3 3   KE L2/3/4 Fem Quad 4 4   DF L4/5 D Peron Tib Ant 5 5   PF S1/2 Tibial Grc/Sol  5 5    Reflexes:(suboptimal given body habitus)  Right Left Comments  Pectoralis      Biceps (C5/6) 1 1   Brachioradialis (C5/6) 1 1    Triceps (C6/7) 1 1    Patellar (L3/4) 1 1    Achilles (S1)      Hoffman      Plantar     Jaw jerk    Sensation:  Light touch intact   Pin prick    Temperature    Vibration   Proprioception    Coordination/Complex Motor:  - Finger to Nose intact BL - Heel to shin unable to do - Rapid alternating movement are intact in BL uppers - Gait: unable to assess  Labs   CBC:  Recent Labs  Lab 04/17/21 0636 04/17/21 0727 04/17/21 0830  WBC 11.6*  --   --   NEUTROABS 9.7*  --   --   HGB 15.6* 17.7* 18.7*  HCT 48.5* 52.0* 55.0*  MCV 78.7*  --   --   PLT 322  --   --     Basic Metabolic Panel:  Lab Results  Component Value  Date   NA 139 04/17/2021   K 4.4 04/17/2021   CO2 34 (H) 04/17/2021   GLUCOSE 236 (H) 04/17/2021   BUN 20 04/17/2021   CREATININE 1.00 04/17/2021   CALCIUM 9.2 04/17/2021   GFRNONAA >60 04/17/2021   GFRAA 100 01/17/2020   Lipid Panel:  Lab Results  Component Value Date   LDLCALC 151 (H) 04/19/2020   HgbA1c:  Lab Results  Component Value Date   HGBA1C 7.2 (H) 10/02/2020   Urine Drug Screen: No results found for: LABOPIA, COCAINSCRNUR, LABBENZ, AMPHETMU, THCU, LABBARB  Alcohol Level No results found for: [place]  CT Head without contrast: Personally reviewed and I do appreciate the concern for a potential L PCA territory infarct.  CT angio Head and Neck with contrast: Personally reviewed and no obvious LVO  MRI Brain: Probably will not be able to get given her body habitus. Will attempt a repeat CTH tomorrow.  Repeat CTH: Will obtain in 24 hours. Impression   Cynthia Becker is a 50 y.o. female with PMH significant for several stroke risk factors including DM2, HTN, HLD along with CHF, asthma, anxiety, depression, morbid obesity with BMI of 79, who presents with acute onset AMS and shallow breathing.  Husband found her confused at home this AM. He gave her narcan and called EMS. Patient is prescribed opiods. CTH here with potential concern for acute/subacute left occipital stroke. Her neurologic examination is notable for Proximal BL lower extremity weakness which is chronic for her given her body habitus and decontitioning, she is strong in distal extremities BL.  I have low suspicion for stroke. Mentation has improved significantly from when she presented and given her non focal exam and noted PCO2 retention of VBG, I have low suspicion that this is stroke. I think the concern for L occipital stroke on CTH is probably artifactual. I will recommend getting a repeat CTH in 24 hours. Will do neuro checks in the meantime. If the repeat CTH is concerning for a stroke, will need to obtain full stroke workup.  Impression: PCO2 retention Low suspicion for a stroke.  Recommendations  - Repeat CTH in 24 hours. - Continue Neuro checks in the meantime - If repeat [facility] tomorrow demonstrates a stroke, will recommend full stroke workup at that time. - Management of PCO2 retention per ED team. ______________________________________________________________________  Plan discussed with Dr. Tomi Bamberger over secure chat.  Thank you for the opportunity to take part in the care of this patient. If you have any further questions, please contact the neurology consultation attending.  Signed,  [place] Pager Number [phone] _ _ _   _ __   _ __ _ _  __ __   _ __   __ _

## 2021-04-17 NOTE — Code Documentation (Signed)
Patient LKW at 0630 this morning when EMS called for new onset confusion per husband. On arrival [facility] suspicious for acute infarct so code stroke called. NIHSS 4 due to bilateral lower extremity weakness that is baseline per patient. Patient transported for CTA and cleared by Neurology to return to room on BiPAP. See Stroke Assessment documentation for more information.  Plan: Repeat CT tomorrow and NIHSS and vitals q2. Discussed with ED RN Kennyth Lose.

## 2021-04-18 ENCOUNTER — Encounter (HOSPITAL_COMMUNITY): Payer: Self-pay | Admitting: Internal Medicine

## 2021-04-18 ENCOUNTER — Inpatient Hospital Stay (HOSPITAL_COMMUNITY): Payer: Medicaid Other

## 2021-04-18 DIAGNOSIS — R4189 Other symptoms and signs involving cognitive functions and awareness: Secondary | ICD-10-CM | POA: Diagnosis not present

## 2021-04-18 LAB — BASIC METABOLIC PANEL
Anion gap: 11 (ref 5–15)
BUN: 11 mg/dL (ref 6–20)
CO2: 35 mmol/L — ABNORMAL HIGH (ref 22–32)
Calcium: 9.1 mg/dL (ref 8.9–10.3)
Chloride: 92 mmol/L — ABNORMAL LOW (ref 98–111)
Creatinine, Ser: 0.77 mg/dL (ref 0.44–1.00)
GFR, Estimated: >60 mL/min (ref >60)
Glucose, Bld: 135 mg/dL — ABNORMAL HIGH (ref 70–99)
Potassium: 3.9 mmol/L (ref 3.5–5.1)
Sodium: 138 mmol/L (ref 135–145)

## 2021-04-18 LAB — HEMOGLOBIN A1C
Hgb A1c MFr Bld: 6.8 % — ABNORMAL HIGH (ref 4.8–5.6)
Mean Plasma Glucose: 148.46 mg/dL

## 2021-04-18 LAB — GLUCOSE, CAPILLARY
Glucose-Capillary: 150 mg/dL — ABNORMAL HIGH (ref 70–99)
Glucose-Capillary: 132 mg/dL — ABNORMAL HIGH (ref 70–99)
Glucose-Capillary: 184 mg/dL — ABNORMAL HIGH (ref 70–99)
Glucose-Capillary: 128 mg/dL — ABNORMAL HIGH (ref 70–99)
Glucose-Capillary: 136 mg/dL — ABNORMAL HIGH (ref 70–99)

## 2021-04-18 LAB — CBC
HCT: 45.1 % (ref 36.0–46.0)
Hemoglobin: 14.4 g/dL (ref 12.0–15.0)
MCH: 24.9 pg — ABNORMAL LOW (ref 26.0–34.0)
MCHC: 31.9 g/dL (ref 30.0–36.0)
MCV: 78 fL — ABNORMAL LOW (ref 80.0–100.0)
Platelets: 311 10*3/uL (ref 150–400)
RBC: 5.78 MIL/uL — ABNORMAL HIGH (ref 3.87–5.11)
RDW: 16.6 % — ABNORMAL HIGH (ref 11.5–15.5)
WBC: 10.8 10*3/uL — ABNORMAL HIGH (ref 4.0–10.5)
nRBC: 0.2 % (ref 0.0–0.2)

## 2021-04-18 NOTE — Progress Notes (Signed)
Mobility Specialist: Progress Note   04/18/21 1721  Mobility  Activity Transferred to/from [employer] [facility] [employer]  Level of Assistance Standby assist, set-up cues, supervision of patient - no hands on  Assistive Device None  Distance Ambulated (ft) 6 ft (3'x2)  Mobility Out of bed for toileting  Mobility Response Tolerated well  Mobility performed by Mobility specialist  $Mobility charge 1 Mobility   Pt requesting to use BSC upon entering room, void successful. Pt sat EOB for a few minutes after transferring back to bed and then returned supine in bed. Pt has call bell and phone at her side.   [facility] [name] Mobility Specialist Mobility Specialist Phone: [phone]

## 2021-04-18 NOTE — Progress Notes (Signed)
PROGRESS NOTE    Cynthia Becker  NLG:[id number] DOB: 07/31/1970 DOA: 04/17/2021 PCP: [name], NP    Brief Narrative:  This 50 years old female with PMH significant for hypertension, hyperlipidemia, diastolic congestive heart failure  pulmonary hypertension, anxiety, diabetes mellitus type 2, OSA, morbid obesity, and chronic pain who presents after being found unresponsive this morning by her husband.  He tried to get her up and she was breathing very shallowly, was drenched in sweat, and was not responding to him despite his efforts.  He gave the patient 4 mg of more Narcan without significant response and thereafter called 911.  Patient was last known to be normal at around 12:30 AM this morning.   Patient was brought in the ED as a stroke code, CT head unremarkable for acute stroke.  Patient was seen by neurology recommended repeat CT head in the morning. MRI could not be obtained because of her body size.  Repeat CT of the head unremarkable.  Neurology signed off,  states patient does not have a stroke.  Assessment & Plan:   Principal Problem:   Episode of unresponsiveness Active Problems:   Morbid obesity with BMI of 60.0-69.9, adult ([place])   Diabetes mellitus due to underlying condition with diabetic autonomic neuropathy, unspecified whether long term insulin use (HCC)   Chronic diastolic CHF (congestive heart failure) (HCC)   Acute on chronic respiratory failure with hypoxia and hypercapnia (HCC)   Abnormal CT of brain   Polycythemia   Chronic back pain   Leukocytosis   Brief episodes of unresponsiveness: Could be secondary to acute on chronic hypoxic and hypercapnic respiratory failure. Patient was found unresponsive by her husband this morning.  She is supposed to be on 2 L nasal cannula oxygen at night, but did not use it last night.  She also taking oxycodone, ibuprofen, Seroquel, and gabapentin prior to going to bed.  On admission his blood gas was significant for PCO2  of 91.5.  Suspect symptoms likely related with polypharmacy. Patient had previously had an accidental overdose earlier this year with Seroquel. Continuous pulse oximetry with oxygen to maintain O2 saturations Continue BiPAP  QHS and PRN Patient is alert and oriented,  back to baseline mental status.   Leukocytosis: This could be reactive. UA unremarkable, no other infectious symptoms appreciated.  Abnormal CT brain: Initial CT head shows low hypoattenuation on the left occipital lobe concerning for acute or subacute PCA territory infarct. Neurology was consulted,  recommended repeat CT head in the a.m. MRI cannot be obtained because of patient's body size. Neurology signed off after repeat CT head came back normal.   Diastolic congestive heart failure: Chronic.   Patient reports lower extremity swelling. BNP  within normal limits at 7.3.   Last echocardiogram revealed EF of 60 to 65% in 09/2020.  She has missed possibly 2 doses of torsemide. Strict I&Os , Daily weights Given Lasix 60 mg IV x1 dose, resume torsemide in the morning. Continue beta-blocker and BiDil.  Diabetes mellitus type 2: Continue gabapentin, Continue Lantus 20 units nightly and regular insulin sliding scale   Essential hypertension: Continue Coreg, BiDil and torsemide   Polycythemia: Could be due to history of sleep apnea. Recheck CBC in the morning.  Chronic back pain: Reduce oxycodone to every 6 hours as needed for pain   Obstructive sleep apnea: Patient reports that she has had 2 prior sleep studies for which she was told that she did not require CPAP.   She is on  2 L of nasal cannula oxygen at night Continue nasal cannula oxygen at night   Morbid obesity: BMI 79.61 kg/m Continue to counsel on need of weight loss with dietary and lifestyle modifications   DVT prophylaxis: Lovenox Code Status: Full code. Family Communication: Husband at bed side. Disposition Plan:     Status is:  Inpatient  Remains inpatient appropriate because: Stroke work-up.   Anticipated discharge home in 1 to 2 days.    Consultants:  Neurology  Procedures: CT head Antimicrobials:   Anti-infectives (From admission, onward)    None        Subjective: Patient was seen and examined at bedside.  Overnight events noted.   Patient is morbidly obese with remains on 3 L/min legs are pretty swollen but no pitting edema.  He denies any dizziness.  Objective: Vitals:   04/18/21 0858 04/18/21 1149 04/18/21 1445 04/18/21 1446  BP: 140/87 131/79 120/68 120/68  Pulse: 92 95 (!) 103 99  Resp: 18 13 13 17   Temp: (!) 97.5 F (36.4 C) 98.2 F (36.8 C) 98.2 F (36.8 C) 98.2 F (36.8 C)  TempSrc: Oral Oral Oral Oral  SpO2: 90% 90% 95% 95%  Weight:      Height:        Intake/Output Summary (Last 24 hours) at 04/18/2021 1618 Last data filed at 04/18/2021 0900 Gross per 24 hour  Intake 360 ml  Output --  Net 360 ml   Filed Weights   04/17/21 4403  Weight: (!) 217 kg    Examination:  General exam: Appears comfortable, morbidly obese, lying on the bed slight upward. Respiratory system: Clear to auscultation. Respiratory effort normal. Cardiovascular system: S1-S2 heard, regular rate and rhythm, no murmur.   Gastrointestinal system: Abdomen is soft, nontender nondistended, BS + Central nervous system: Alert and oriented. No focal neurological deficits. Extremities: Both legs are pretty swollen but no pitting edema. Skin: No rashes, lesions or ulcers Psychiatry: Judgement and insight appear normal. Mood & affect appropriate.     Data Reviewed: I have personally reviewed following labs and imaging studies  CBC: Recent Labs  Lab 04/17/21 0636 04/17/21 0727 04/17/21 0830 04/18/21 0157  WBC 11.6*  --   --  10.8*  NEUTROABS 9.7*  --   --   --   HGB 15.6* 17.7* 18.7* 14.4  HCT 48.5* 52.0* 55.0* 45.1  MCV 78.7*  --   --  78.0*  PLT 322  --   --  474   Basic Metabolic  Panel: Recent Labs  Lab 04/17/21 0636 04/17/21 0727 04/17/21 0830 04/18/21 0157  NA 137 138 139 138  K 4.5 4.3 4.4 3.9  CL 93*  --  93* 92*  CO2 34*  --   --  35*  GLUCOSE 244*  --  236* 135*  BUN 16  --  20 11  CREATININE 1.01*  --  1.00 0.77  CALCIUM 9.2  --   --  9.1   GFR: Estimated Creatinine Clearance: 158.9 mL/min (by C-G formula based on SCr of 0.77 mg/dL). Liver Function Tests: Recent Labs  Lab 04/17/21 0636  AST 22  ALT 19  ALKPHOS 79  BILITOT 0.6  PROT 8.0  ALBUMIN 3.2*   No results for input(s): LIPASE, AMYLASE in the last 168 hours. No results for input(s): AMMONIA in the last 168 hours. Coagulation Profile: Recent Labs  Lab 04/17/21 2222  INR 1.1   Cardiac Enzymes: No results for input(s): CKTOTAL, CKMB, CKMBINDEX, TROPONINI in the last  168 hours. BNP (last 3 results) No results for input(s): PROBNP in the last 8760 hours. HbA1C: Recent Labs    04/18/21 0157  HGBA1C 6.8*   CBG: Recent Labs  Lab 04/17/21 0644 04/17/21 2233 04/18/21 0611 04/18/21 1140  GLUCAP 195* 150* 132* 184*   Lipid Profile: No results for input(s): CHOL, HDL, LDLCALC, TRIG, CHOLHDL, LDLDIRECT in the last 72 hours. Thyroid Function Tests: No results for input(s): TSH, T4TOTAL, FREET4, T3FREE, THYROIDAB in the last 72 hours. Anemia Panel: No results for input(s): VITAMINB12, FOLATE, FERRITIN, TIBC, IRON, RETICCTPCT in the last 72 hours. Sepsis Labs: No results for input(s): PROCALCITON, LATICACIDVEN in the last 168 hours.  Recent Results (from the past 240 hour(s))  Resp Panel by RT-PCR (Flu A&B, Covid) Nasopharyngeal Swab     Status: None   Collection Time: 04/17/21  8:21 AM   Specimen: Nasopharyngeal Swab; Nasopharyngeal(NP) swabs in vial transport medium  Result Value Ref Range Status   SARS Coronavirus 2 by RT PCR NEGATIVE NEGATIVE Final    Comment: (NOTE) SARS-CoV-2 target nucleic acids are NOT DETECTED.  The SARS-CoV-2 RNA is generally detectable in upper  respiratory specimens during the acute phase of infection. The lowest concentration of SARS-CoV-2 viral copies this assay can detect is 138 copies/mL. A negative result does not preclude SARS-Cov-2 infection and should not be used as the sole basis for treatment or other patient management decisions. A negative result may occur with  improper specimen collection/handling, submission of specimen other than nasopharyngeal swab, presence of viral mutation(s) within the areas targeted by this assay, and inadequate number of viral copies(<138 copies/mL). A negative result must be combined with clinical observations, patient history, and epidemiological information. The expected result is Negative.  Fact Sheet for Patients:  EntrepreneurPulse.com.au  Fact Sheet for Healthcare Providers:  IncredibleEmployment.be  This test is no t yet approved or cleared by the [place] FDA and  has been authorized for detection and/or diagnosis of SARS-CoV-2 by FDA under an Emergency Use Authorization (EUA). This EUA will remain  in effect (meaning this test can be used) for the duration of the COVID-19 declaration under Section 564(b)(1) of the Act, 21 U.S.C.section 360bbb-3(b)(1), unless the authorization is terminated  or revoked sooner.       Influenza A by PCR NEGATIVE NEGATIVE Final   Influenza B by PCR NEGATIVE NEGATIVE Final    Comment: (NOTE) The Xpert Xpress SARS-CoV-2/FLU/RSV plus assay is intended as an aid in the diagnosis of influenza from Nasopharyngeal swab specimens and should not be used as a sole basis for treatment. Nasal washings and aspirates are unacceptable for Xpert Xpress SARS-CoV-2/FLU/RSV testing.  Fact Sheet for Patients: EntrepreneurPulse.com.au  Fact Sheet for Healthcare Providers: IncredibleEmployment.be  This test is not yet approved or cleared by the [place] FDA and has been  authorized for detection and/or diagnosis of SARS-CoV-2 by FDA under an Emergency Use Authorization (EUA). This EUA will remain in effect (meaning this test can be used) for the duration of the COVID-19 declaration under Section 564(b)(1) of the Act, 21 U.S.C. section 360bbb-3(b)(1), unless the authorization is terminated or revoked.  Performed at [facility], [place] [address] [place], [place] [address]     Radiology Studies: CT HEAD WO CONTRAST (5MM)  Result Date: 04/18/2021 CLINICAL DATA:  50 year old female with history of neurologic deficit. Evaluate for potential left occipital stroke. EXAM: CT HEAD WITHOUT CONTRAST TECHNIQUE: Contiguous axial images were obtained from the base of the skull through the vertex without  intravenous contrast. COMPARISON:  No priors. FINDINGS: Brain: No evidence of acute infarction, hemorrhage, hydrocephalus, extra-axial collection or mass lesion/mass effect. Vascular: No hyperdense vessel or unexpected calcification. Skull: Normal. Negative for fracture or focal lesion. Sinuses/Orbits: No acute finding. Other: None. IMPRESSION: 1. No acute intracranial abnormalities. The appearance of the brain is normal. Electronically Signed   By: Vinnie [place] M.D.   On: 04/18/2021 08:40   CT Head Wo Contrast  Result Date: 04/17/2021 CLINICAL DATA:  50 year old female with history of altered mental status. EXAM: CT HEAD WITHOUT CONTRAST TECHNIQUE: Contiguous axial images were obtained from the base of the skull through the vertex without intravenous contrast. COMPARISON:  No priors. FINDINGS: Brain: Ill-defined low-attenuation in the left occipital lobe with loss of gray-white differentiation, concerning for acute or subacute left occipital infarct. No evidence of acute hemorrhage, hydrocephalus, extra-axial collection or mass lesion/mass effect. Vascular: No hyperdense vessel or unexpected calcification. Skull: Normal. Negative for fracture or focal lesion.  Sinuses/Orbits: No acute finding. Other: None. IMPRESSION: 1. Low-attenuation in the left occipital region concerning for acute or subacute PCA territory infarct. Further evaluation with brain MRI is recommended at this time 2 confirm suspected infarct. Critical Value/emergent results were called by telephone at the time of interpretation on 04/17/2021 at 7:56 am to provider HALEY SAGE, who verbally acknowledged these results. Electronically Signed   By: Vinnie [place] M.D.   On: 04/17/2021 08:01   DG Chest Portable 1 View  Result Date: 04/17/2021 CLINICAL DATA:  Shortness of breath for 1 day EXAM: PORTABLE CHEST 1 VIEW COMPARISON:  Chest radiograph 10/02/2020 FINDINGS: The cardiomediastinal silhouette is within normal limits. Lung volumes are markedly diminished. There are increased interstitial markings throughout both lungs. There is no focal lobar consolidation. There is no definite pleural effusion. There is no appreciable pneumothorax. There is no acute osseous abnormality. IMPRESSION: Low lung volumes with increased interstitial markings they return fleck mild-to-moderate pulmonary interstitial edema. Electronically Signed   By: Valetta Mole M.D.   On: 04/17/2021 08:43   CT ANGIO HEAD NECK W WO CM (CODE STROKE)  Result Date: 04/17/2021 CLINICAL DATA:  Altered mental status (AMS), unclear cause EXAM: CT ANGIOGRAPHY HEAD AND NECK TECHNIQUE: Multidetector CT imaging of the head and neck was performed using the standard protocol during bolus administration of intravenous contrast. Multiplanar CT image reconstructions and MIPs were obtained to evaluate the vascular anatomy. Carotid stenosis measurements (when applicable) are obtained utilizing NASCET criteria, using the distal internal carotid diameter as the denominator. CONTRAST:  15mL OMNIPAQUE IOHEXOL 350 MG/ML SOLN COMPARISON:  Same day CT head. FINDINGS: CTA NECK FINDINGS Aortic arch: Very limited evaluation due to patient body habitus and  associated beam hardening artifact. Great vessel origins appear to be patent. Enlarged main pulmonary artery. Right carotid system: Limited evaluation proximally due to beam hardening artifact. The proximal common carotid artery appears to be grossly patent. No significant (greater than 50%) stenosis of the carotid bifurcation or ICA. Retropharyngeal course. Left carotid system: Limited evaluation proximally due to beam hardening artifact. The proximal common carotid artery appears to be grossly patent. No significant (greater than 50%) stenosis of the carotid bifurcation or ICA. Retropharyngeal course. Vertebral arteries: Nondiagnostic evaluation of the lower and mid vertebral arteries neck due to motion and beam hardening artifact. Patent vertebral arteries in the upper Skeleton: Limited evaluation due to beam hardening and motion artifact. Vertebral body heights appear to be grossly maintained. Other neck: Limited evaluation without obvious acute abnormality. Adenoid and tonsillar hypertrophy with  narrowing of the nasopharyngeal airway. Upper chest: Motion limited evaluation. Mild ill-defined opacities in lateral right upper lobe. Review of the MIP images confirms the above findings CTA HEAD FINDINGS Limited by venous contamination. Anterior circulation: Bilateral intracranial ICAs, MCAs and ACAs are patent without proximal hemodynamically significant stenosis. No aneurysm identified. Posterior circulation: Bilateral intradural vertebral arteries, basilar artery, posterior cerebral arteries are patent without proximal hemodynamically significant stenosis. Left posterior communicating artery, anatomic variant. No aneurysm identified. Venous sinuses: No evidence of dural venous sinus thrombosis. Review of the MIP images confirms the above findings IMPRESSION: CTA head: No large vessel occlusion or proximal hemodynamically significant stenosis. Limited distal evaluation due to venous contamination. CTA neck: 1.  Significantly limited evaluation in the lower and mid neck due to beam hardening and motion artifact. No significant (greater than 50%) stenosis at the carotid bifurcations or involving the ICAs. Nondiagnostic evaluation of the lower and mid vertebral arteries with patent vertebral arteries in the upper neck. 2. Motion limited evaluation of the lungs with mild ill-defined opacities in the imaged lateral right upper lobe, potentially infectious/inflammatory. 3. Enlarged main pulmonary artery, which can be seen with pulmonary arterial hypertension. 4. Adenoid and tonsillar hypertrophy with narrowing of the nasopharyngeal airway. Electronically Signed   By: Margaretha Sheffield M.D.   On: 04/17/2021 09:23    Scheduled Meds:  carvedilol  3.125 mg Oral BID WC   enoxaparin (LOVENOX) injection  100 mg Subcutaneous Q24H   gabapentin  600 mg Oral TID   insulin aspart  0-9 Units Subcutaneous TID WC   insulin glargine-yfgn  20 Units Subcutaneous QHS   isosorbide-hydrALAZINE  1 tablet Oral BID   linaclotide  290 mcg Oral QAC breakfast   loratadine  10 mg Oral Daily   mometasone-formoterol  2 puff Inhalation BID   pantoprazole  40 mg Oral Daily   QUEtiapine  400 mg Oral QHS   sodium chloride flush  3 mL Intravenous Q12H   torsemide  40 mg Oral Daily   Continuous Infusions:   LOS: 1 day    Time spent: 35 mins    Valeria Krisko, MD [facility]   If 7PM-7AM, please contact night-coverage

## 2021-04-18 NOTE — Progress Notes (Signed)
Brief Neuro Update:  Repeat CTH does not demonstrate a stroke. Exam yesterday with no focal deficit and not consistent with stroke. I do not think that she has a stroke. She is too big to fit in MRI. We will signoff, please feel free to contact us with any questions or concerns.  [place] Pager Number [phone]

## 2021-04-19 DIAGNOSIS — R4189 Other symptoms and signs involving cognitive functions and awareness: Secondary | ICD-10-CM | POA: Diagnosis not present

## 2021-04-19 LAB — CBC
HCT: 47.2 % — ABNORMAL HIGH (ref 36.0–46.0)
Hemoglobin: 15.3 g/dL — ABNORMAL HIGH (ref 12.0–15.0)
MCH: 25.1 pg — ABNORMAL LOW (ref 26.0–34.0)
MCHC: 32.4 g/dL (ref 30.0–36.0)
MCV: 77.5 fL — ABNORMAL LOW (ref 80.0–100.0)
Platelets: 310 10*3/uL (ref 150–400)
RBC: 6.09 MIL/uL — ABNORMAL HIGH (ref 3.87–5.11)
RDW: 16.5 % — ABNORMAL HIGH (ref 11.5–15.5)
WBC: 8.3 10*3/uL (ref 4.0–10.5)
nRBC: 0 % (ref 0.0–0.2)

## 2021-04-19 LAB — GLUCOSE, CAPILLARY
Glucose-Capillary: 127 mg/dL — ABNORMAL HIGH (ref 70–99)
Glucose-Capillary: 161 mg/dL — ABNORMAL HIGH (ref 70–99)
Glucose-Capillary: 155 mg/dL — ABNORMAL HIGH (ref 70–99)
Glucose-Capillary: 103 mg/dL — ABNORMAL HIGH (ref 70–99)
Glucose-Capillary: 144 mg/dL — ABNORMAL HIGH (ref 70–99)

## 2021-04-19 MED ORDER — OXYCODONE-ACETAMINOPHEN 5-325 MG PO TABS
1.0000 | ORAL_TABLET | Freq: Once | ORAL | Status: AC
  Administered 2021-04-19: 1 via ORAL
  Filled 2021-04-19: qty 1

## 2021-04-19 NOTE — Progress Notes (Signed)
Pt says she does not want to wear a BiPAP tonight, but will call if she wants it.

## 2021-04-19 NOTE — Progress Notes (Signed)
PROGRESS NOTE    Cynthia Becker  QQP:[id number] DOB: April 29, 1971 DOA: 04/17/2021 PCP: [name], NP    Brief Narrative:  This 50 years old female with PMH significant for hypertension, hyperlipidemia, diastolic congestive heart failure  pulmonary hypertension, anxiety, diabetes mellitus type 2, OSA, morbid obesity, and chronic pain who presents after being found unresponsive this morning by her husband.  He tried to get her up and she was breathing very shallowly, was drenched in sweat, and was not responding to him despite his efforts.  He gave the patient 4 mg of more Narcan without significant response and thereafter called 911.  Patient was last known to be normal at around 12:30 AM this morning.   Patient was brought in the ED as a stroke code, CT head unremarkable for acute stroke.  Patient was seen by neurology recommended repeat CT head in the morning. MRI could not be obtained because of her body size.  Repeat CT of the head unremarkable.  Neurology signed off,  states patient does not have a stroke.  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 Patient was found unresponsive by her husband this morning.  She is supposed to be on 2 L nasal cannula oxygen at night, but did not use it last night.  She also taking oxycodone, ibuprofen, Seroquel, and gabapentin prior to going to bed.  On admission his blood gas was significant for PCO2  of 91.5.  Suspect symptoms likely related with polypharmacy. Patient had previously had an accidental overdose earlier this year with Seroquel. Continuous pulse oximetry with oxygen to maintain O2 saturations Continue BiPAP  QHS and PRN Patient is alert and oriented,  back to baseline mental status.   Leukocytosis: Resolved. This could be reactive. UA unremarkable, no other infectious symptoms appreciated.  Abnormal CT brain: Initial CT head shows low hypoattenuation on the left occipital lobe concerning for acute or subacute PCA territory infarct. Neurology was consulted,  recommended repeat CT head in the a.m. MRI cannot be obtained because of patient's body size. Neurology signed off after repeat CT head came back normal.   Diastolic congestive heart failure: Chronic.   Patient reports lower extremity swelling. BNP  within normal limits at 7.3.   Last echocardiogram revealed EF of 60 to 65% in 09/2020.  She has missed possibly 2 doses of torsemide. Strict I&Os , Daily weights Given Lasix 60 mg IV x1 dose, resume torsemide in the morning. Continue beta-blocker and BiDil.  Diabetes mellitus type 2: Continue gabapentin, Continue Lantus 20 units nightly and regular insulin sliding scale   Essential hypertension: Continue Coreg, BiDil and torsemide   Polycythemia: Could be due to history of sleep apnea. Recheck CBC in the morning.  Chronic back pain: Reduce oxycodone to every 6 hours as needed for pain   Obstructive sleep apnea: Patient reports that she has had 2 prior sleep studies for which she was told that she did not require CPAP.   She is  on 2 L of nasal cannula oxygen at night Continue nasal cannula oxygen at night   Morbid obesity: BMI 79.61 kg/m Continue to counsel on need of weight loss with dietary and lifestyle modifications   DVT prophylaxis: Lovenox Code Status: Full code. Family Communication: Husband at bed side. Disposition Plan:     Status is:  Inpatient  Remains inpatient appropriate because: Stroke work-up.   Anticipated discharge home in 1 to 2 days.    Consultants:  Neurology  Procedures: CT head Antimicrobials:   Anti-infectives (From admission, onward)    None        Subjective: Patient was seen and examined at bedside.  Overnight events noted.   Patient is morbidly obese with remains on 3 L/min,  legs are pretty swollen but no pitting edema.  She reports feeling a lot of dizziness, states she is hurting a lot and not able to get out of bed. Objective: Vitals:   04/18/21 2329 04/19/21 0311 04/19/21 0746 04/19/21 1331  BP: 127/84 (!) 142/93 135/85 122/81  Pulse: 83 91 87 81  Resp: 17 18 19    Temp: 98.6 F (37 C) 98.2 F (36.8 C) 98.1 F (36.7 C) 97.8 F (36.6 C)  TempSrc: Oral Oral Oral Oral  SpO2: 98% 95% 100% 100%  Weight:      Height:        Intake/Output Summary (Last 24 hours) at 04/19/2021 1440 Last data filed at 04/18/2021 2300 Gross per 24 hour  Intake --  Output 1800 ml  Net -1800 ml   Filed Weights   04/17/21 9983  Weight: (!) 217 kg    Examination:  General exam: Appears comfortable, morbidly obese, lying on the bed slight upward. Respiratory system: Clear to auscultation. Respiratory effort normal. Cardiovascular system: S1-S2 heard, regular rate and rhythm, no murmur.   Gastrointestinal system: Abdomen is soft, nontender , non distended, BS + Central nervous system: Alert and oriented x 3. No focal neurological deficits. Extremities: Both legs are pretty swollen but no pitting edema. Skin: No rashes, lesions or ulcers Psychiatry: Judgement and insight appear normal. Mood & affect appropriate.     Data Reviewed: I have personally reviewed following labs and imaging studies  CBC: Recent Labs  Lab 04/17/21 0636 04/17/21 0727 04/17/21 0830 04/18/21 0157 04/19/21 0155  WBC 11.6*  --   --  10.8* 8.3  NEUTROABS 9.7*  --   --   --   --   HGB 15.6* 17.7* 18.7* 14.4 15.3*   HCT 48.5* 52.0* 55.0* 45.1 47.2*  MCV 78.7*  --   --  78.0* 77.5*  PLT 322  --   --  311 382   Basic Metabolic Panel: Recent Labs  Lab 04/17/21 0636 04/17/21 0727 04/17/21 0830 04/18/21 0157  NA 137 138 139 138  K 4.5 4.3 4.4 3.9  CL 93*  --  93* 92*  CO2 34*  --   --  35*  GLUCOSE 244*  --  236* 135*  BUN 16  --  20 11  CREATININE 1.01*  --  1.00 0.77  CALCIUM 9.2  --   --  9.1   GFR: Estimated Creatinine Clearance: 158.9 mL/min (by C-G formula based on SCr of 0.77 mg/dL). Liver Function Tests: Recent Labs  Lab 04/17/21 0636  AST 22  ALT 19  ALKPHOS 79  BILITOT 0.6  PROT 8.0  ALBUMIN 3.2*   No results for input(s): LIPASE, AMYLASE in the last 168 hours. No results for input(s): AMMONIA  in the last 168 hours. Coagulation Profile: Recent Labs  Lab 04/17/21 2222  INR 1.1   Cardiac Enzymes: No results for input(s): CKTOTAL, CKMB, CKMBINDEX, TROPONINI in the last 168 hours. BNP (last 3 results) No results for input(s): PROBNP in the last 8760 hours. HbA1C: Recent Labs    04/18/21 0157  HGBA1C 6.8*   CBG: Recent Labs  Lab 04/18/21 1140 04/18/21 1642 04/18/21 2114 04/19/21 0633 04/19/21 0943  GLUCAP 184* 128* 136* 127* 161*   Lipid Profile: No results for input(s): CHOL, HDL, LDLCALC, TRIG, CHOLHDL, LDLDIRECT in the last 72 hours. Thyroid Function Tests: No results for input(s): TSH, T4TOTAL, FREET4, T3FREE, THYROIDAB in the last 72 hours. Anemia Panel: No results for input(s): VITAMINB12, FOLATE, FERRITIN, TIBC, IRON, RETICCTPCT in the last 72 hours. Sepsis Labs: No results for input(s): PROCALCITON, LATICACIDVEN in the last 168 hours.  Recent Results (from the past 240 hour(s))  Resp Panel by RT-PCR (Flu A&B, Covid) Nasopharyngeal Swab     Status: None   Collection Time: 04/17/21  8:21 AM   Specimen: Nasopharyngeal Swab; Nasopharyngeal(NP) swabs in vial transport medium  Result Value Ref Range Status   SARS Coronavirus 2 by RT PCR NEGATIVE  NEGATIVE Final    Comment: (NOTE) SARS-CoV-2 target nucleic acids are NOT DETECTED.  The SARS-CoV-2 RNA is generally detectable in upper respiratory specimens during the acute phase of infection. The lowest concentration of SARS-CoV-2 viral copies this assay can detect is 138 copies/mL. A negative result does not preclude SARS-Cov-2 infection and should not be used as the sole basis for treatment or other patient management decisions. A negative result may occur with  improper specimen collection/handling, submission of specimen other than nasopharyngeal swab, presence of viral mutation(s) within the areas targeted by this assay, and inadequate number of viral copies(<138 copies/mL). A negative result must be combined with clinical observations, patient history, and epidemiological information. The expected result is Negative.  Fact Sheet for Patients:  EntrepreneurPulse.com.au  Fact Sheet for Healthcare Providers:  IncredibleEmployment.be  This test is no t yet approved or cleared by the [place] FDA and  has been authorized for detection and/or diagnosis of SARS-CoV-2 by FDA under an Emergency Use Authorization (EUA). This EUA will remain  in effect (meaning this test can be used) for the duration of the COVID-19 declaration under Section 564(b)(1) of the Act, 21 U.S.C.section 360bbb-3(b)(1), unless the authorization is terminated  or revoked sooner.       Influenza A by PCR NEGATIVE NEGATIVE Final   Influenza B by PCR NEGATIVE NEGATIVE Final    Comment: (NOTE) The Xpert Xpress SARS-CoV-2/FLU/RSV plus assay is intended as an aid in the diagnosis of influenza from Nasopharyngeal swab specimens and should not be used as a sole basis for treatment. Nasal washings and aspirates are unacceptable for Xpert Xpress SARS-CoV-2/FLU/RSV testing.  Fact Sheet for Patients: EntrepreneurPulse.com.au  Fact Sheet for Healthcare  Providers: IncredibleEmployment.be  This test is not yet approved or cleared by the [place] FDA and has been authorized for detection and/or diagnosis of SARS-CoV-2 by FDA under an Emergency Use Authorization (EUA). This EUA will remain in effect (meaning this test can be used) for the duration of the COVID-19 declaration under Section 564(b)(1) of the Act, 21 U.S.C. section 360bbb-3(b)(1), unless the authorization is terminated or revoked.  Performed at [facility], [place] [address] [place],  [address]     Radiology Studies: CT HEAD WO CONTRAST (5MM)  Result Date: 04/18/2021 CLINICAL DATA:  50 year old female with history of neurologic deficit. Evaluate for potential left occipital stroke. EXAM: CT HEAD WITHOUT CONTRAST TECHNIQUE: Contiguous axial images were obtained from the base of the skull through the vertex without intravenous contrast. COMPARISON:  No priors. FINDINGS: Brain: No evidence of acute infarction, hemorrhage, hydrocephalus, extra-axial collection or mass lesion/mass effect. Vascular: No hyperdense vessel or unexpected calcification. Skull: Normal. Negative for fracture or focal lesion. Sinuses/Orbits: No acute finding. Other: None. IMPRESSION: 1. No acute intracranial abnormalities. The appearance of the brain is normal. Electronically Signed   By: Vinnie Langton M.D.   On: 04/18/2021 08:40    Scheduled Meds:  carvedilol  3.125 mg Oral BID WC   enoxaparin (LOVENOX) injection  100 mg Subcutaneous Q24H   gabapentin  600 mg Oral TID   insulin aspart  0-9 Units Subcutaneous TID WC   insulin glargine-yfgn  20 Units Subcutaneous QHS   isosorbide-hydrALAZINE  1 tablet Oral BID   linaclotide  290 mcg Oral QAC breakfast   loratadine  10 mg Oral Daily   mometasone-formoterol  2 puff Inhalation BID   pantoprazole  40 mg Oral Daily   QUEtiapine  400 mg Oral QHS   sodium chloride flush  3 mL Intravenous Q12H   torsemide  40 mg Oral Daily    Continuous Infusions:   LOS: 2 days    Time spent: 25 mins    Aby Gessel, MD [facility]   If 7PM-7AM, please contact night-coverage

## 2021-04-19 NOTE — Progress Notes (Signed)
Mobility Specialist: Progress Note   04/19/21 1600  Mobility  Activity Ambulated in room  Level of Assistance Minimal assist, patient does 75% or more  Assistive Device Front wheel walker  Distance Ambulated (ft) 16 ft  Mobility Ambulated with assistance in room  Mobility Response Tolerated well  Mobility performed by Mobility specialist  $Mobility charge 1 Mobility   Pre-Mobility: 76 HR, 99% SpO2 During Mobility: 95% SpO2 Post-Mobility: 87 HR, 153/94 BP, 92-955% SpO2  Pt able to ambulate to the sink and back on RA. Pt required minA to sit EOB from supine but was standby assist to stand as well as during ambulation. Pt c/o BLE pain during ambulation she rated 7/10. Pt back to bed after walk with call bell and phone at her side. Pt desat to mid to high 80% after returning to bed so 2 L/min [place] re-applied, RN notified.   [facility] [place] Drier Mobility Specialist Mobility Specialist Phone: [phone]

## 2021-04-20 ENCOUNTER — Ambulatory Visit: Payer: Medicaid Other | Admitting: Nurse Practitioner

## 2021-04-20 DIAGNOSIS — R4189 Other symptoms and signs involving cognitive functions and awareness: Secondary | ICD-10-CM | POA: Diagnosis not present

## 2021-04-20 LAB — GLUCOSE, CAPILLARY
Glucose-Capillary: 140 mg/dL — ABNORMAL HIGH (ref 70–99)
Glucose-Capillary: 130 mg/dL — ABNORMAL HIGH (ref 70–99)
Glucose-Capillary: 163 mg/dL — ABNORMAL HIGH (ref 70–99)

## 2021-04-20 NOTE — TOC Transition Note (Signed)
Transition of Care [facility]) - CM/SW Discharge Note   Patient Details  Name: Cynthia Becker MRN: [id number] Date of Birth: [birth year]  Transition of Care [facility]) CM/SW Contact:  Zenon Mayo, RN Phone Number: 04/20/2021, 12:02 PM   Clinical Narrative:    Patient is for dc today, NCM spoke with patient, she states she worked with physical therapy and they states she will need HHPT and a rolling walker.  NCM offered choice, she does not have a preference and she is ok with [employer] supplying the walker for her, she already has home oxygen with [employer] so they will bring a tank up to her.  She states she was using oxygen just at night but now it will be continously.  This information was given to [facility] with [employer].    Final next level of care: [place] Barriers to Discharge: No Barriers Identified   Patient Goals and CMS Choice Patient states their goals for this hospitalization and ongoing recovery are:: return home CMS Medicare.gov Compare Post Acute Care list provided to:: Patient Choice offered to / list presented to : Patient  Discharge Placement                       Discharge Plan and Services                DME Arranged: Walker rolling DME Agency: [employer] Date DME Agency Contacted: 04/20/21 Time DME Agency Contacted: [phone] Representative spoke with at DME Agency: [place]: PT [place]: [place] Date [place]: 04/20/21 Time [place]: 1202 Representative spoke with at [place]: [place] ([place]) Interventions     Readmission Risk Interventions No flowsheet data found.

## 2021-04-20 NOTE — Discharge Summary (Signed)
Physician Discharge Summary  Cynthia Becker MBW:[id number] DOB: 12/02/1970 DOA: 04/17/2021  PCP: [name], NP  Admit date: 04/17/2021  Discharge date: 04/20/2021  Admitted From: Home.  Disposition:  Home  Recommendations for Outpatient Follow-up:  Follow up with PCP in 1-2 weeks. Please obtain BMP/CBC in one week. Advised to discontinue Flexeril.  Continue gabapentin as prescribed. Advised to reduce the frequency of oxycodone to q8hr as needed for pain.  Home Health: None Equipment/Devices:Home oxygen  Discharge Condition: Stable CODE STATUS:Full code Diet recommendation: Heart Healthy   Brief Summary / Hospital Course: This 50 years old female with PMH significant for hypertension, hyperlipidemia, diastolic congestive heart failure  pulmonary hypertension, anxiety, diabetes mellitus type 2, OSA, morbid obesity, and chronic pain syndrome who presented in the ED after being found unresponsive in the morning on day of admission by her husband.  He tried to get her up,  she was breathing very shallowly, was drenched in sweat, and was not responding to him despite his efforts.  He gave the patient 4 mg of  Narcan without significant response and thereafter called 911.  Patient was last known to be normal at around 12:30 AM this morning.   Patient was brought in the ED as stroke code, CT head unremarkable for acute stroke.  Patient was seen by neurology recommended repeat CT head in the morning. MRI could not be obtained because of her body size.  Repeat CT of the head unremarkable.  Neurology signed off,  states patient does not have a stroke.  This could be secondary to patient taking a lot of pain medications including gabapentin and Flexeril and usually she uses oxygen at night, at night prior to admission she has not used her oxygen Which has caused more CO2 retention and possible brief syncopal episode. Patient has improved significantly.  Patient weaned down to 2 L of supplemental  oxygen which is her baseline. She feels better, She has ambulated in the room with some support.  Home and services been arranged.  Patient wants to be discharged and patient is being discharged home  She was managed for below problems.   Discharge Diagnoses:  Principal Problem:   Episode of unresponsiveness Active Problems:   Morbid obesity with BMI of 60.0-69.9, adult ([place])   Diabetes mellitus due to underlying condition with diabetic autonomic neuropathy, unspecified whether long term insulin use (HCC)   Chronic diastolic CHF (congestive heart failure) (HCC)   Acute on chronic respiratory failure with hypoxia and hypercapnia (HCC)   Abnormal CT of brain   Polycythemia   Chronic back pain   Leukocytosis  Brief episodes of unresponsiveness: Could be secondary to acute on chronic hypoxic and hypercapnic respiratory failure. Patient was found unresponsive by her husband this morning.  She is supposed to be on 2 L nasal cannula oxygen at night, but did not use it last night.  She also taking oxycodone, ibuprofen, Seroquel, and gabapentin prior to going to bed.  On admission his blood gas was significant for PCO2 of 91.5.  Suspect symptoms likely related with polypharmacy. Patient had previously had an accidental overdose earlier this year with Seroquel. Continuous pulse oximetry with oxygen to maintain O2 saturations Continue BiPAP  QHS and PRN Patient is alert and oriented,  back to baseline mental status.   Leukocytosis: Resolved. This could be reactive. UA unremarkable, no other infectious symptoms appreciated.   Abnormal CT brain: Initial CT head shows low hypoattenuation on the left occipital lobe concerning for acute or subacute  PCA territory infarct. Neurology was consulted,  recommended repeat CT head in the a.m. MRI cannot be obtained because of patient's body size. Neurology signed off after repeat CT head came back normal.   Diastolic congestive heart failure: Chronic.    Patient reports lower extremity swelling. BNP  within normal limits at 7.3.   Last echocardiogram revealed EF of 60 to 65% in 09/2020.  She has missed possibly 2 doses of torsemide. Strict I&Os , Daily weights Given Lasix 60 mg IV x1 dose, resumed torsemide at discharge Continue beta-blocker and BiDil.   Diabetes mellitus type 2: Continue gabapentin, Continue Lantus 20 units nightly and regular insulin sliding scale   Essential hypertension: Continue Coreg, BiDil and torsemide   Polycythemia: Could be due to history of sleep apnea.    Chronic back pain: Reduce oxycodone to every 6 hours as needed for pain   Obstructive sleep apnea: Patient reports that she has had 2 prior sleep studies for which she was told that she did not require CPAP.   She is on 2 L of nasal cannula oxygen at night Continue nasal cannula oxygen at night   Morbid obesity: BMI 79.61 kg/m Continue to counsel on need of weight loss with dietary and lifestyle modifications    Discharge Instructions  Discharge Instructions     Call MD for:  difficulty breathing, headache or visual disturbances   Complete by: As directed    Call MD for:  persistant dizziness or light-headedness   Complete by: As directed    Call MD for:  persistant nausea and vomiting   Complete by: As directed    Call MD for:  severe uncontrolled pain   Complete by: As directed    Diet - low sodium heart healthy   Complete by: As directed    Diet - low sodium heart healthy   Complete by: As directed    Diet Carb Modified   Complete by: As directed    Increase activity slowly   Complete by: As directed    Increase activity slowly   Complete by: As directed       Allergies as of 04/20/2021   No Known Allergies      Medication List     STOP taking these medications    cyclobenzaprine 5 MG tablet Commonly known as: FLEXERIL   omeprazole 20 MG capsule Commonly known as: PRILOSEC   spironolactone 50 MG tablet Commonly  known as: ALDACTONE       TAKE these medications    Accu-Chek FastClix Lancets Misc USE TO CHECK BLOOD SUGAR TWICE DAILY   Accu-Chek Guide test strip Generic drug: glucose blood 1 each by Other route 3 (three) times daily. Use as instructed   albuterol (2.5 MG/3ML) 0.083% nebulizer solution Commonly known as: PROVENTIL USE 1 VIAL IN NEBULIZER EVERY 6 HOURS AS NEEDED FOR WHEEZING OR SHORTNESS OF BREATH   BiDil 20-37.5 MG tablet Generic drug: isosorbide-hydrALAZINE Take 1 tablet by mouth in the morning and at bedtime.   carvedilol 3.125 MG tablet Commonly known as: COREG Take 1 tablet (3.125 mg total) by mouth 2 (two) times daily with a meal.   cetirizine 10 MG tablet Commonly known as: ZyrTEC Allergy Take 1 tablet (10 mg total) by mouth daily.   Fluticasone-Salmeterol 250-50 MCG/DOSE Aepb Commonly known as: Advair Diskus Inhale 1 puff into the lungs 2 (two) times daily.   gabapentin 600 MG tablet Commonly known as: NEURONTIN Take 1 tablet (600 mg total) by mouth 3 (three)  times daily.   ibuprofen 800 MG tablet Commonly known as: ADVIL Take 800 mg by mouth every 8 (eight) hours as needed for moderate pain.   insulin glargine 100 UNIT/ML Solostar Pen Commonly known as: LANTUS Inject 20 Units into the skin at bedtime.   Linzess 290 MCG Caps capsule Generic drug: linaclotide TAKE 1 CAPSULE BY MOUTH ONCE DAILY BEFORE BREAKFAST   naloxone 4 MG/0.1ML Liqd nasal spray kit Commonly known as: NARCAN Place 1 spray into the nose as needed.   Oxycodone HCl 10 MG Tabs Take 10 mg by mouth every 4 (four) hours.   PEN NEEDLES 31GX5/16" 31G X 8 MM Misc 1 pen by Does not apply route at bedtime.   promethazine 25 MG tablet Commonly known as: PHENERGAN Take 25 mg by mouth every 8 (eight) hours as needed for nausea or vomiting.   QUEtiapine 400 MG tablet Commonly known as: SEROQUEL Take 400 mg by mouth at bedtime.   Torsemide 40 MG Tabs Take 40 mg by mouth daily. What  changed: Another medication with the same name was removed. Continue taking this medication, and follow the directions you see here.   vitamin B-12 100 MCG tablet Commonly known as: CYANOCOBALAMIN Take 100 mcg by mouth daily.   vitamin C 500 MG tablet Commonly known as: ASCORBIC ACID Take 500 mg by mouth daily.               Durable Medical Equipment  (From admission, onward)           Start     Ordered   04/20/21 1246  For home use only DME Walker rolling  Once       Comments: HD  Question Answer Comment  Walker: With [place] Wheels   Patient needs a walker to treat with the following condition Weakness      04/20/21 1245   04/20/21 1138  For home use only DME oxygen  Once       Question Answer Comment  Length of Need Lifetime   Mode or (Route) Nasal cannula   Liters per Minute 2   Frequency Continuous (stationary and portable oxygen unit needed)   Oxygen conserving device Yes   Oxygen delivery system Gas      04/20/21 1137            Follow-up Information     [name], NP Follow up in 1 week(s).   Specialty: Adult Health Nurse Practitioner Contact information: [address] [place] [address] [phone]         [facility] Follow up.   Specialty: Home Health Services Why: HHPT Contact information: [place] [address] [phone]         [facility] Follow up.   Why: rolling walker Contact information: [place] [address] [phone]                No Known Allergies  Consultations: NOne   Procedures/Studies: CT HEAD WO CONTRAST (5MM)  Result Date: 04/18/2021 CLINICAL DATA:  50 year old female with history of neurologic deficit. Evaluate for potential left occipital stroke. EXAM: CT HEAD WITHOUT CONTRAST TECHNIQUE: Contiguous axial images were obtained from the base of the skull through the vertex without intravenous contrast.  COMPARISON:  No priors. FINDINGS: Brain: No evidence of acute infarction, hemorrhage, hydrocephalus, extra-axial collection or mass lesion/mass effect. Vascular: No hyperdense vessel or unexpected calcification. Skull: Normal. Negative for fracture or  focal lesion. Sinuses/Orbits: No acute finding. Other: None. IMPRESSION: 1. No acute intracranial abnormalities. The appearance of the brain is normal. Electronically Signed   By: Vinnie Langton M.D.   On: 04/18/2021 08:40   CT Head Wo Contrast  Result Date: 04/17/2021 CLINICAL DATA:  50 year old female with history of altered mental status. EXAM: CT HEAD WITHOUT CONTRAST TECHNIQUE: Contiguous axial images were obtained from the base of the skull through the vertex without intravenous contrast. COMPARISON:  No priors. FINDINGS: Brain: Ill-defined low-attenuation in the left occipital lobe with loss of gray-white differentiation, concerning for acute or subacute left occipital infarct. No evidence of acute hemorrhage, hydrocephalus, extra-axial collection or mass lesion/mass effect. Vascular: No hyperdense vessel or unexpected calcification. Skull: Normal. Negative for fracture or focal lesion. Sinuses/Orbits: No acute finding. Other: None. IMPRESSION: 1. Low-attenuation in the left occipital region concerning for acute or subacute PCA territory infarct. Further evaluation with brain MRI is recommended at this time 2 confirm suspected infarct. Critical Value/emergent results were called by telephone at the time of interpretation on 04/17/2021 at 7:56 am to provider HALEY SAGE, who verbally acknowledged these results. Electronically Signed   By: Vinnie Langton M.D.   On: 04/17/2021 08:01   DG Chest Portable 1 View  Result Date: 04/17/2021 CLINICAL DATA:  Shortness of breath for 1 day EXAM: PORTABLE CHEST 1 VIEW COMPARISON:  Chest radiograph 10/02/2020 FINDINGS: The cardiomediastinal silhouette is within normal limits. Lung volumes are markedly diminished.  There are increased interstitial markings throughout both lungs. There is no focal lobar consolidation. There is no definite pleural effusion. There is no appreciable pneumothorax. There is no acute osseous abnormality. IMPRESSION: Low lung volumes with increased interstitial markings they return fleck mild-to-moderate pulmonary interstitial edema. Electronically Signed   By: Valetta Mole M.D.   On: 04/17/2021 08:43   CT ANGIO HEAD NECK W WO CM (CODE STROKE)  Result Date: 04/17/2021 CLINICAL DATA:  Altered mental status (AMS), unclear cause EXAM: CT ANGIOGRAPHY HEAD AND NECK TECHNIQUE: Multidetector CT imaging of the head and neck was performed using the standard protocol during bolus administration of intravenous contrast. Multiplanar CT image reconstructions and MIPs were obtained to evaluate the vascular anatomy. Carotid stenosis measurements (when applicable) are obtained utilizing NASCET criteria, using the distal internal carotid diameter as the denominator. CONTRAST:  120m OMNIPAQUE IOHEXOL 350 MG/ML SOLN COMPARISON:  Same day CT head. FINDINGS: CTA NECK FINDINGS Aortic arch: Very limited evaluation due to patient body habitus and associated beam hardening artifact. Great vessel origins appear to be patent. Enlarged main pulmonary artery. Right carotid system: Limited evaluation proximally due to beam hardening artifact. The proximal common carotid artery appears to be grossly patent. No significant (greater than 50%) stenosis of the carotid bifurcation or ICA. Retropharyngeal course. Left carotid system: Limited evaluation proximally due to beam hardening artifact. The proximal common carotid artery appears to be grossly patent. No significant (greater than 50%) stenosis of the carotid bifurcation or ICA. Retropharyngeal course. Vertebral arteries: Nondiagnostic evaluation of the lower and mid vertebral arteries neck due to motion and beam hardening artifact. Patent vertebral arteries in the upper  Skeleton: Limited evaluation due to beam hardening and motion artifact. Vertebral body heights appear to be grossly maintained. Other neck: Limited evaluation without obvious acute abnormality. Adenoid and tonsillar hypertrophy with narrowing of the nasopharyngeal airway. Upper chest: Motion limited evaluation. Mild ill-defined opacities in lateral right upper lobe. Review of the MIP images confirms the above findings CTA HEAD FINDINGS Limited by venous contamination.  Anterior circulation: Bilateral intracranial ICAs, MCAs and ACAs are patent without proximal hemodynamically significant stenosis. No aneurysm identified. Posterior circulation: Bilateral intradural vertebral arteries, basilar artery, posterior cerebral arteries are patent without proximal hemodynamically significant stenosis. Left posterior communicating artery, anatomic variant. No aneurysm identified. Venous sinuses: No evidence of dural venous sinus thrombosis. Review of the MIP images confirms the above findings IMPRESSION: CTA head: No large vessel occlusion or proximal hemodynamically significant stenosis. Limited distal evaluation due to venous contamination. CTA neck: 1. Significantly limited evaluation in the lower and mid neck due to beam hardening and motion artifact. No significant (greater than 50%) stenosis at the carotid bifurcations or involving the ICAs. Nondiagnostic evaluation of the lower and mid vertebral arteries with patent vertebral arteries in the upper neck. 2. Motion limited evaluation of the lungs with mild ill-defined opacities in the imaged lateral right upper lobe, potentially infectious/inflammatory. 3. Enlarged main pulmonary artery, which can be seen with pulmonary arterial hypertension. 4. Adenoid and tonsillar hypertrophy with narrowing of the nasopharyngeal airway. Electronically Signed   By: Margaretha Sheffield M.D.   On: 04/17/2021 09:23      Subjective: Patient was seen and examined at bedside.  Overnight  events noted.   Patient reports feeling much improved.  She is on 2 L of supplemental oxygen which is her baseline.   Patient wants to be discharged.  Discharge Exam: Vitals:   04/20/21 0921 04/20/21 1132  BP: 132/74 119/85  Pulse: 85 92  Resp:  19  Temp:  98.1 F (36.7 C)  SpO2:  99%   Vitals:   04/20/21 0752 04/20/21 0842 04/20/21 0921 04/20/21 1132  BP: 115/85  132/74 119/85  Pulse: 81  85 92  Resp: 15   19  Temp: 97.9 F (36.6 C)   98.1 F (36.7 C)  TempSrc: Oral   Oral  SpO2: 100% 98%  99%  Weight:      Height:        General: Pt is alert, awake, not in acute distress Cardiovascular: RRR, S1/S2 +, no rubs, no gallops Respiratory: CTA bilaterally, no wheezing, no rhonchi Abdominal: Soft, NT, ND, bowel sounds + Extremities: no edema, no cyanosis    The results of significant diagnostics from this hospitalization (including imaging, microbiology, ancillary and laboratory) are listed below for reference.     Microbiology: Recent Results (from the past 240 hour(s))  Resp Panel by RT-PCR (Flu A&B, Covid) Nasopharyngeal Swab     Status: None   Collection Time: 04/17/21  8:21 AM   Specimen: Nasopharyngeal Swab; Nasopharyngeal(NP) swabs in vial transport medium  Result Value Ref Range Status   SARS Coronavirus 2 by RT PCR NEGATIVE NEGATIVE Final    Comment: (NOTE) SARS-CoV-2 target nucleic acids are NOT DETECTED.  The SARS-CoV-2 RNA is generally detectable in upper respiratory specimens during the acute phase of infection. The lowest concentration of SARS-CoV-2 viral copies this assay can detect is 138 copies/mL. A negative result does not preclude SARS-Cov-2 infection and should not be used as the sole basis for treatment or other patient management decisions. A negative result may occur with  improper specimen collection/handling, submission of specimen other than nasopharyngeal swab, presence of viral mutation(s) within the areas targeted by this assay, and  inadequate number of viral copies(<138 copies/mL). A negative result must be combined with clinical observations, patient history, and epidemiological information. The expected result is Negative.  Fact Sheet for Patients:  EntrepreneurPulse.com.au  Fact Sheet for Healthcare Providers:  IncredibleEmployment.be  This test  is no t yet approved or cleared by the [place] and  has been authorized for detection and/or diagnosis of SARS-CoV-2 by FDA under an Emergency Use Authorization (EUA). This EUA will remain  in effect (meaning this test can be used) for the duration of the COVID-19 declaration under Section 564(b)(1) of the Act, 21 U.S.C.section 360bbb-3(b)(1), unless the authorization is terminated  or revoked sooner.       Influenza A by PCR NEGATIVE NEGATIVE Final   Influenza B by PCR NEGATIVE NEGATIVE Final    Comment: (NOTE) The Xpert Xpress SARS-CoV-2/FLU/RSV plus assay is intended as an aid in the diagnosis of influenza from Nasopharyngeal swab specimens and should not be used as a sole basis for treatment. Nasal washings and aspirates are unacceptable for Xpert Xpress SARS-CoV-2/FLU/RSV testing.  Fact Sheet for Patients: EntrepreneurPulse.com.au  Fact Sheet for Healthcare Providers: IncredibleEmployment.be  This test is not yet approved or cleared by the [place] FDA and has been authorized for detection and/or diagnosis of SARS-CoV-2 by FDA under an Emergency Use Authorization (EUA). This EUA will remain in effect (meaning this test can be used) for the duration of the COVID-19 declaration under Section 564(b)(1) of the Act, 21 U.S.C. section 360bbb-3(b)(1), unless the authorization is terminated or revoked.  Performed at [facility], [place] [address] [place], [place] [address]      Labs: BNP (last 3 results) Recent Labs    04/24/20 1320 10/02/20 1227 04/17/21 0636   BNP 85.2 17.9 7.3   Basic Metabolic Panel: Recent Labs  Lab 04/17/21 0636 04/17/21 0727 04/17/21 0830 04/18/21 0157  NA 137 138 139 138  K 4.5 4.3 4.4 3.9  CL 93*  --  93* 92*  CO2 34*  --   --  35*  GLUCOSE 244*  --  236* 135*  BUN 16  --  20 11  CREATININE 1.01*  --  1.00 0.77  CALCIUM 9.2  --   --  9.1   Liver Function Tests: Recent Labs  Lab 04/17/21 0636  AST 22  ALT 19  ALKPHOS 79  BILITOT 0.6  PROT 8.0  ALBUMIN 3.2*   No results for input(s): LIPASE, AMYLASE in the last 168 hours. No results for input(s): AMMONIA in the last 168 hours. CBC: Recent Labs  Lab 04/17/21 0636 04/17/21 0727 04/17/21 0830 04/18/21 0157 04/19/21 0155  WBC 11.6*  --   --  10.8* 8.3  NEUTROABS 9.7*  --   --   --   --   HGB 15.6* 17.7* 18.7* 14.4 15.3*  HCT 48.5* 52.0* 55.0* 45.1 47.2*  MCV 78.7*  --   --  78.0* 77.5*  PLT 322  --   --  311 310   Cardiac Enzymes: No results for input(s): CKTOTAL, CKMB, CKMBINDEX, TROPONINI in the last 168 hours. BNP: Invalid input(s): POCBNP CBG: Recent Labs  Lab 04/19/21 1619 04/19/21 2104 04/20/21 0615 04/20/21 0819 04/20/21 1127  GLUCAP 103* 144* 140* 130* 163*   D-Dimer No results for input(s): DDIMER in the last 72 hours. Hgb A1c Recent Labs    04/18/21 0157  HGBA1C 6.8*   Lipid Profile No results for input(s): CHOL, HDL, LDLCALC, TRIG, CHOLHDL, LDLDIRECT in the last 72 hours. Thyroid function studies No results for input(s): TSH, T4TOTAL, T3FREE, THYROIDAB in the last 72 hours.  Invalid input(s): FREET3 Anemia work up No results for input(s): VITAMINB12, FOLATE, FERRITIN, TIBC, IRON, RETICCTPCT in the last 72 hours. Urinalysis    Component  Value Date/Time   COLORURINE YELLOW 04/17/2021 1208   APPEARANCEUR CLOUDY (A) 04/17/2021 1208   LABSPEC 1.028 04/17/2021 1208   PHURINE 6.0 04/17/2021 1208   GLUCOSEU NEGATIVE 04/17/2021 1208   HGBUR NEGATIVE 04/17/2021 1208   HGBUR negative 08/02/2010 1448   BILIRUBINUR  NEGATIVE 04/17/2021 1208   BILIRUBINUR neg 04/19/2020 1454   KETONESUR NEGATIVE 04/17/2021 1208   PROTEINUR 30 (A) 04/17/2021 1208   UROBILINOGEN 0.2 04/19/2020 1454   UROBILINOGEN 1.0 02/21/2012 1647   NITRITE NEGATIVE 04/17/2021 1208   LEUKOCYTESUR TRACE (A) 04/17/2021 1208   Sepsis Labs Invalid input(s): PROCALCITONIN,  WBC,  LACTICIDVEN Microbiology Recent Results (from the past 240 hour(s))  Resp Panel by RT-PCR (Flu A&B, Covid) Nasopharyngeal Swab     Status: None   Collection Time: 04/17/21  8:21 AM   Specimen: Nasopharyngeal Swab; Nasopharyngeal(NP) swabs in vial transport medium  Result Value Ref Range Status   SARS Coronavirus 2 by RT PCR NEGATIVE NEGATIVE Final    Comment: (NOTE) SARS-CoV-2 target nucleic acids are NOT DETECTED.  The SARS-CoV-2 RNA is generally detectable in upper respiratory specimens during the acute phase of infection. The lowest concentration of SARS-CoV-2 viral copies this assay can detect is 138 copies/mL. A negative result does not preclude SARS-Cov-2 infection and should not be used as the sole basis for treatment or other patient management decisions. A negative result may occur with  improper specimen collection/handling, submission of specimen other than nasopharyngeal swab, presence of viral mutation(s) within the areas targeted by this assay, and inadequate number of viral copies(<138 copies/mL). A negative result must be combined with clinical observations, patient history, and epidemiological information. The expected result is Negative.  Fact Sheet for Patients:  EntrepreneurPulse.com.au  Fact Sheet for Healthcare Providers:  IncredibleEmployment.be  This test is no t yet approved or cleared by the [place] FDA and  has been authorized for detection and/or diagnosis of SARS-CoV-2 by FDA under an Emergency Use Authorization (EUA). This EUA will remain  in effect (meaning this test can be  used) for the duration of the COVID-19 declaration under Section 564(b)(1) of the Act, 21 U.S.C.section 360bbb-3(b)(1), unless the authorization is terminated  or revoked sooner.       Influenza A by PCR NEGATIVE NEGATIVE Final   Influenza B by PCR NEGATIVE NEGATIVE Final    Comment: (NOTE) The Xpert Xpress SARS-CoV-2/FLU/RSV plus assay is intended as an aid in the diagnosis of influenza from Nasopharyngeal swab specimens and should not be used as a sole basis for treatment. Nasal washings and aspirates are unacceptable for Xpert Xpress SARS-CoV-2/FLU/RSV testing.  Fact Sheet for Patients: EntrepreneurPulse.com.au  Fact Sheet for Healthcare Providers: IncredibleEmployment.be  This test is not yet approved or cleared by the [place] FDA and has been authorized for detection and/or diagnosis of SARS-CoV-2 by FDA under an Emergency Use Authorization (EUA). This EUA will remain in effect (meaning this test can be used) for the duration of the COVID-19 declaration under Section 564(b)(1) of the Act, 21 U.S.C. section 360bbb-3(b)(1), unless the authorization is terminated or revoked.  Performed at [facility], [place] [address], [place], [place] [address]      Time coordinating discharge: Over 30 minutes  SIGNED:   Shawna Clamp, MD  [facility] 04/20/2021, 4:29 PM Pager   If 7PM-7AM, please contact night-coverage

## 2021-04-20 NOTE — Progress Notes (Signed)
SATURATION QUALIFICATIONS: (This note is used to comply with regulatory documentation for home oxygen)  Patient Saturations on Room Air at Rest = 92%  Patient Saturations on Room Air while Ambulating = N/A (SpO2 already down to 88% on 2L with activity)  Patient Saturations on 2 Liters of oxygen while Ambulating = 88%  Please briefly explain why patient needs home oxygen: Patient requires supplemental oxygen to maintain SpO2 >/88% with activity  Mabeline Caras, PT, DPT Acute Rehabilitation Services  Pager [phone] Office [phone]

## 2021-04-20 NOTE — Discharge Instructions (Signed)
Advised to follow-up with primary care physician in 1 week. Advised to discontinue Flexeril and take gabapentin as scheduled. Advised to reduce the frequency of oxycodone pain medication for pain control. Advised to continue follow-up with pulmonology for sleep specialist.

## 2021-04-20 NOTE — Evaluation (Signed)
Physical Therapy Evaluation & Discharge Patient Details Name: Cynthia Becker MRN: [id number] DOB: July 16, 1970 Today's Date: 04/20/2021  History of Present Illness  Pt is a 50 y.o. female admitted 04/17/21 after being found unresponsive by husband, code stroke. Repeat head CT negative for acute abnormality; unable to obtain MRI due to body habitus. Workup for potential acute on chronic hypoxic respiratory failure as pt supposed to wear 2L O2 overnight. PMH includes HTN, HLD, HF, pulmonary HTN, anxiety, DM2, OSA, morbid obesity, chronic pain.   Clinical Impression  Patient evaluated by Physical Therapy with no further acute PT needs identified. PTA, pt limited household ambulator due to pain and SOB, use of electric scooter for community distances, lives with husband who assists PRN. Today, pt tolerated brief bouts of standing activity, stability improved with use of RW to offload painful BLEs and conserve energy; DOE 3/4 with activity, productive cough - pt reports, "It feels the same as when I had pneumonia in the hospital in April." Pt requiring 2L O2 to maintain SpO2 >/88% with activity. Educ re: activity recommendations, incentive spirometer use, energy conservation. All education has been completed and the patient has no further questions. Acute PT is signing off. Thank you for this referral.   Recommendations for follow up therapy are one component of a multi-disciplinary discharge planning process, led by the attending physician.  Recommendations may be updated based on patient status, additional functional criteria and insurance authorization.  Follow Up Recommendations Home health PT    Assistance Recommended at Discharge Intermittent Supervision/Assistance  Functional Status Assessment Patient has had a recent decline in their functional status and demonstrates the ability to make significant improvements in function in a reasonable and predictable amount of time.  Equipment  Recommendations  Other (comment) (bariatric rolling walker)    Recommendations for Other Services       Precautions / Restrictions Precautions Precautions: Fall;Other (comment) Precaution Comments: Watch SpO2 Restrictions Weight Bearing Restrictions: No      Mobility  Bed Mobility               General bed mobility comments: Received sitting with one leg OOB, increased time and effort to bring LLE off edge of bed    Transfers Overall transfer level: Needs assistance Equipment used: 1 person hand held assist;Rolling walker (2 wheels) Transfers: Sit to/from Stand Sit to Stand: Min guard;Supervision           General transfer comment: Initial HHA for stability to stand from EOB, though pt not using for physical assist, min guard for balance; additional sit<>stand trial with bariatric RW, supervision for safety, cues for sequencing/technique    Ambulation/Gait Ambulation/Gait assistance: Supervision Gait Distance (Feet): 8 Feet Assistive device: Rolling walker (2 wheels) Gait Pattern/deviations: Step-to pattern;Wide base of support;Antalgic     General Gait Details: Attempted a few steps without DME, pt ultimately unable due to pain; additional gait trial with bariatric RW - slow, antalgic gait, supervision for safety; further mobility limited by pain  Stairs            Wheelchair Mobility    Modified Rankin (Stroke Patients Only)       Balance Overall balance assessment: Needs assistance Sitting-balance support: No upper extremity supported Sitting balance-Leahy Scale: Fair       Standing balance-Leahy Scale: Fair Standing balance comment: can static stand without UE support; stability improved with RW to offload painful BLEs  Pertinent Vitals/Pain Pain Assessment: Faces Faces Pain Scale: Hurts even more Pain Location: BLEs (R>L) Pain Descriptors / Indicators: Discomfort;Grimacing;Guarding;Heaviness Pain  Intervention(s): Monitored during session;Limited activity within patient's tolerance    Home Living Family/patient expects to be discharged to:: Private residence Living Arrangements: Spouse/significant other Available Help at Discharge: Family;Available PRN/intermittently Type of Home: House Home Access: Ramped entrance       Home Layout: One level Home Equipment: Wheelchair - power Additional Comments: patient reports "supposed to wear" 2L O2 at home, but hasn't worn the past two weeks for various reasons    Prior Function Prior Level of Function : Needs assist       Physical Assist : Mobility (physical)     Mobility Comments: When not painful, pt typically mod indep with household mobility via furniture/wall surfing, uses electric scooter in kitchen and for community distances; at times requires assist from husband to stand or get OOB when painful ADLs Comments: Husband assists as needed for ADL/iADLs     Hand Dominance        Extremity/Trunk Assessment   Upper Extremity Assessment Upper Extremity Assessment: Overall WFL for tasks assessed    Lower Extremity Assessment Lower Extremity Assessment: Generalized weakness       Communication   Communication: No difficulties  Cognition Arousal/Alertness: Awake/alert Behavior During Therapy: WFL for tasks assessed/performed Overall Cognitive Status: Within Functional Limits for tasks assessed                                          General Comments General comments (skin integrity, edema, etc.): pt's husband present and supportive. Increased time discussing strategies and safety for mobility at home when pt limited by pain and SOB. Pt reports productive cough and persistent DOE (pt states, "It feels exactly how it did when I had pneumonia at the hospital back in april") - incentive spirometer provided and practiced, pt pulling ~1250 mL with good technique; instructed on frequency    Exercises      Assessment/Plan    PT Assessment All further PT needs can be met in the next venue of care  PT Problem List Decreased strength;Decreased range of motion;Decreased activity tolerance;Decreased balance;Decreased mobility;Decreased knowledge of use of DME;Cardiopulmonary status limiting activity;Pain;Obesity       PT Treatment Interventions      PT Goals (Current goals can be found in the Care Plan section)  Acute Rehab PT Goals Patient Stated Goal: for symptoms to be resolved PT Goal Formulation: With patient/family Time For Goal Achievement: 05/04/21 Potential to Achieve Goals: Good    Frequency     Barriers to discharge        Co-evaluation               AM-PAC PT "6 Clicks" Mobility  Outcome Measure Help needed turning from your back to your side while in a flat bed without using bedrails?: A Little Help needed moving from lying on your back to sitting on the side of a flat bed without using bedrails?: A Little Help needed moving to and from a bed to a chair (including a wheelchair)?: A Little Help needed standing up from a chair using your arms (e.g., wheelchair or bedside chair)?: A Little Help needed to walk in hospital room?: A Little Help needed climbing 3-5 steps with a railing? : A Lot 6 Click Score: 17    End of Session  Equipment Utilized During Treatment: Oxygen Activity Tolerance: Patient limited by pain Patient left: in bed;with call bell/phone within reach;with family/visitor present Nurse Communication: Mobility status PT Visit Diagnosis: Other abnormalities of gait and mobility (R26.89);Pain Pain - part of body: Leg;Knee    Time: 1109-1140 PT Time Calculation (min) (ACUTE ONLY): 31 min   Charges:   PT Evaluation $PT Eval Moderate Complexity: 1 Mod PT Treatments $Self Care/Home Management: 8-22      Mabeline Caras, PT, DPT Acute Rehabilitation Services  Pager [phone] Office [place] 04/20/2021, 12:43 PM

## 2021-04-26 ENCOUNTER — Telehealth: Payer: Self-pay

## 2021-04-26 NOTE — Telephone Encounter (Signed)
Transition Care Management Unsuccessful Follow-up Telephone Call  Date of discharge and from where:  04/20/2021 / [facility]  Attempts:  1st Attempt  Reason for unsuccessful TCM follow-up call: HIPAA compliant:  Left voice message  Quinn Plowman RN,BSN,CCM RN Case Manager [place] [phone]

## 2021-04-27 ENCOUNTER — Telehealth: Payer: Self-pay

## 2021-04-27 NOTE — Telephone Encounter (Signed)
Transition Care Management Unsuccessful Follow-up Telephone Call  Date of discharge and from where:  04/20/2021  [facility]  Attempts:  2nd Attempt  Reason for unsuccessful TCM follow-up call:  No answer/busy   Tomasa Rand, RN, BSN, CEN [place] Coordinator [phone]

## 2021-04-30 ENCOUNTER — Other Ambulatory Visit: Payer: Self-pay | Admitting: Nurse Practitioner

## 2021-05-07 ENCOUNTER — Ambulatory Visit: Payer: Medicaid Other | Admitting: Nurse Practitioner

## 2021-05-08 ENCOUNTER — Other Ambulatory Visit: Payer: Self-pay | Admitting: Nurse Practitioner

## 2021-05-14 ENCOUNTER — Encounter: Payer: Self-pay | Admitting: Nurse Practitioner

## 2021-05-14 ENCOUNTER — Ambulatory Visit (INDEPENDENT_AMBULATORY_CARE_PROVIDER_SITE_OTHER): Payer: Medicaid Other | Admitting: Nurse Practitioner

## 2021-05-14 ENCOUNTER — Other Ambulatory Visit: Payer: Self-pay

## 2021-05-14 VITALS — BP 106/68 | HR 97 | Temp 97.2°F | Ht 65.0 in | Wt >= 6400 oz

## 2021-05-14 DIAGNOSIS — M5126 Other intervertebral disc displacement, lumbar region: Secondary | ICD-10-CM

## 2021-05-14 DIAGNOSIS — Z0000 Encounter for general adult medical examination without abnormal findings: Secondary | ICD-10-CM | POA: Diagnosis not present

## 2021-05-14 DIAGNOSIS — R051 Acute cough: Secondary | ICD-10-CM

## 2021-05-14 DIAGNOSIS — R0602 Shortness of breath: Secondary | ICD-10-CM | POA: Diagnosis not present

## 2021-05-14 DIAGNOSIS — I5032 Chronic diastolic (congestive) heart failure: Secondary | ICD-10-CM

## 2021-05-14 DIAGNOSIS — M79604 Pain in right leg: Secondary | ICD-10-CM

## 2021-05-14 MED ORDER — GABAPENTIN 800 MG PO TABS: 800.0000 mg | ORAL_TABLET | Freq: Three times a day (TID) | ORAL | 0 refills | Status: DC

## 2021-05-14 MED ORDER — BENZONATATE 200 MG PO CAPS: 200.0000 mg | ORAL_CAPSULE | Freq: Two times a day (BID) | ORAL | 0 refills | Status: DC | PRN

## 2021-05-14 NOTE — Patient Instructions (Signed)
You were seen today in the [facility] for follow up after hospital discharge. Labs were collected, results will be available via MyChart or, if abnormal, you will be contacted by clinic staff. You were prescribed medications, please take as directed. Please follow up in 3 mth for reevaluation of symptoms. Please complete ordered xray. Please complete follow up with weight loss clinic, cardiology and pulmonology, prior to your next appointment.

## 2021-05-14 NOTE — Progress Notes (Signed)
[place], [place]  [address] Phone:  [phone]   Fax:  [phone] Subjective:   Patient ID: Cynthia Becker, female    DOB: March 13, 1971, 50 y.o.   MRN: [id number]  Chief Complaint  Patient presents with   Hospitalization Follow-up    04/17/2021 - 04/20/2021; Respiratory distress. On 2L of O2.  Legs swelling feels like she needs higher dose of torsemide,    HPI Cynthia Becker 50 y.o. female  has a past medical history of Anemia, Anxiety, Arthritis, Asthma, CHF (congestive heart failure) ([place]), CN (constipation) (09/14/2014), DEGENERATIVE DISC DISEASE, LUMBOSACRAL SPINE W/RADICULOPATHY (11/01/2009), Depression, Diabetes ([place]), H/O dizziness, Headache(784.0), High blood pressure, HYPERLIPIDEMIA (02/13/2010), INSOMNIA (08/24/2008), Morbid obesity ([place]) (05/24/2008), and SVD (spontaneous vaginal delivery).  To the [facility] for follow up s/p hospital admission and reevaluation of medications. Patient states that she was admitted to the hospital from 04/17/21 to 04/20/21 for respiratory distress. States that she was found unresponsive by husband. After discharge, continues to have shortness of breath, with new onset of productive cough. Patient also concerned about recent increase in swelling of BLE over the past 2 wks.  Endorses productive cough with yellow sputum. Has chronic intermittent shortness of breath, currently on 2L O2 via [place] at home and takes prescribed inhaled steroids with no improvement. Has periodic increased fatigue. Symptoms mimic past occurrences of pneumonia, concerned for possible recurrence and need for admission to the hospital. Has not completed follow up with pulmonology in 1 yr.  Also complaining of worsening swelling over the past two weeks in the BLE, with /increased pain. Rates pain 7/10 and describes as burning and stabbing, radiating from bilateral knees to feet. Pain increases with palpation. Was prescribed gabapentin in the past  for pain, requesting an  increase in dosage. Also suspects that her torsemide may need to be increased.   Since been discharged from the hospital, has been unable to ambulate due to shortness of breath and pain in extremities. Currently being followed and managed by pain management. Husband, and, at times, son help with ADLs. Patient also uses a wheelchair for assistance.   Endorses smoking 1-2 cigarettes "every now and than" due to stress over weight and other life stressors. States, " I know it's not good for me and I need to quit." Currently compliant with all medications. Declines vaccinations, states that she has been very ill in the past due to vaccines and is scared of being readmitted to the hospital.   Denies any fatigue, chest pain, shortness of breath, HA or dizziness. Denies any blurred vision, numbness or tingling.    Past Medical History:  Diagnosis Date   Anemia    iv iron treatment dec 2014/ see oncology for this last in dec 2014   Anxiety    Arthritis    knee, hands   Asthma    seasonal related/ albuterol used when uri   CHF (congestive heart failure) ([facility])    CN (constipation) 09/14/2014   DEGENERATIVE DISC DISEASE, LUMBOSACRAL SPINE W/RADICULOPATHY 11/01/2009   Qualifier: Diagnosis of  By: Jorene Minors, [place]     Depression    Diabetes ([place])    H/O dizziness    Hx   Headache(784.0)    otc med prn   High blood pressure    HYPERLIPIDEMIA 02/13/2010   Qualifier: Diagnosis of  By: Versie Starks     INSOMNIA 08/24/2008   Qualifier: Diagnosis of  By: Radene Ou MD, [place]  Morbid obesity ([place]) 05/24/2008   Qualifier: Diagnosis of  By: Radene Ou MD, [place]     SVD (spontaneous vaginal delivery)    x 3    Past Surgical History:  Procedure Laterality Date   ABDOMINAL HYSTERECTOMY  03/13/2014   total    BILATERAL SALPINGECTOMY Bilateral 04/12/2014   Procedure: BILATERAL SALPINGECTOMY;  Surgeon: Woodroe Mode, MD;  Location: [facility];  Service: Gynecology;   Laterality: Bilateral;   CARPAL TUNNEL RELEASE  08/30/2011   Procedure: CARPAL TUNNEL RELEASE;  Surgeon: Wynonia Sours, MD;  Location: [facility];  Service: Orthopedics;  Laterality: Right;   CHOLECYSTECTOMY  10/11   lap choli   GALLBLADDER SURGERY  04/04/2010   HYSTEROSCOPY N/A 02/08/2014   Procedure: HYSTEROSCOPY;  Surgeon: Woodroe Mode, MD;  Location: [facility];  Service: Gynecology;  Laterality: N/A;   HYSTEROSCOPY WITH NOVASURE N/A 02/08/2014   Procedure: ATTEMPTED NOVASURE ABLATION;  Surgeon: Woodroe Mode, MD;  Location: [facility];  Service: Gynecology;  Laterality: N/A;   INTRAUTERINE DEVICE INSERTION     07/19/2013   SEPTOPLASTY N/A 09/08/2013   Procedure: SEPTOPLASTY;  Surgeon: Ruby Cola, MD;  Location: [facility];  Service: ENT;  Laterality: N/A;   SPINE SURGERY     TONSILLECTOMY     TONSILLECTOMY AND ADENOIDECTOMY Bilateral 09/08/2013   Procedure: TONSILLECTOMY ;  Surgeon: Ruby Cola, MD;  Location: [facility];  Service: ENT;  Laterality: Bilateral;   TUBAL LIGATION     VAGINAL HYSTERECTOMY N/A 04/12/2014   Procedure: LAPAROSCOPIC ASSISTED VAGINAL HYSTERECTOMY;  Surgeon: Woodroe Mode, MD;  Location: [facility];  Service: Gynecology;  Laterality: N/A;   WISDOM TOOTH EXTRACTION      Family History  Problem Relation Age of Onset   Diabetes Mother    Hypertension Father    Heart disease Other    Diabetes Other    Hypertension Other    Alcohol abuse Other    Hypertension Brother    Asthma Brother    Hypertension Brother    Asthma Brother    COPD Maternal Grandmother    Lung cancer Maternal Grandmother    Asthma Paternal Grandmother    Asthma Paternal Aunt     Social History   Socioeconomic History   Marital status: Married    Spouse name: Not on file   Number of children: 3   Years of education: Not on file   Highest education level: Not on file  Occupational History   Not on file  Tobacco Use   Smoking status: Former    Packs/day: 0.25    Types:  Cigarettes    Quit date: 04/24/2020    Years since quitting: 1.0   Smokeless tobacco: Never   Tobacco comments:    4-5 cigarettes a day  Vaping Use   Vaping Use: Never used  Substance and Sexual Activity   Alcohol use: No   Drug use: No   Sexual activity: Not on file  Other Topics Concern   Not on file  Social History Narrative   Not on file   Social Determinants of Health   Financial Resource Strain: Not on file  Food Insecurity: Not on file  Transportation Needs: Not on file  Physical Activity: Not on file  Stress: Not on file  Social Connections: Not on file  Intimate Partner Violence: Not on file    Outpatient Medications Prior to Visit  Medication Sig Dispense Refill   ACCU-CHEK FASTCLIX LANCETS MISC USE TO [place]  TWICE DAILY  11   ACCU-CHEK GUIDE test strip 1 each by Other route 3 (three) times daily. Use as instructed 270 each 3   albuterol (PROVENTIL) (2.5 MG/3ML) 0.083% nebulizer solution USE 1 VIAL IN NEBULIZER EVERY 6 HOURS AS NEEDED FOR WHEEZING OR SHORTNESS OF BREATH 150 mL 0   carvedilol (COREG) 3.125 MG tablet Take 1 tablet (3.125 mg total) by mouth 2 (two) times daily with a meal. 60 tablet 1   cetirizine (ZYRTEC ALLERGY) 10 MG tablet Take 1 tablet (10 mg total) by mouth daily. 90 tablet 3   cyclobenzaprine (FLEXERIL) 5 MG tablet Take 1 tablet by mouth three times daily as needed for muscle spasm 90 tablet 0   Fluticasone-Salmeterol (ADVAIR DISKUS) 250-50 MCG/DOSE AEPB Inhale 1 puff into the lungs 2 (two) times daily. 60 each 11   ibuprofen (ADVIL) 800 MG tablet Take 800 mg by mouth every 8 (eight) hours as needed for moderate pain.     insulin glargine (LANTUS) 100 UNIT/ML Solostar Pen Inject 20 Units into the skin at bedtime. 15 mL 11   Insulin Pen Needle (PEN NEEDLES 31GX5/16") 31G X 8 MM MISC 1 pen by Does not apply route at bedtime. 100 each 2   isosorbide-hydrALAZINE (BIDIL) 20-37.5 MG tablet Take 1 tablet by mouth in the morning and at bedtime.  180 tablet 2   LINZESS 290 MCG CAPS capsule TAKE 1 CAPSULE BY MOUTH ONCE DAILY BEFORE BREAKFAST 90 capsule 0   naloxone (NARCAN) nasal spray 4 mg/0.1 mL Place 1 spray into the nose as needed.     Oxycodone HCl 10 MG TABS Take 10 mg by mouth every 4 (four) hours.     promethazine (PHENERGAN) 25 MG tablet Take 25 mg by mouth every 8 (eight) hours as needed for nausea or vomiting.     QUEtiapine (SEROQUEL) 400 MG tablet Take 400 mg by mouth at bedtime.     torsemide (DEMADEX) 20 MG tablet Take 2 tablets by mouth once daily 60 tablet 0   vitamin B-12 (CYANOCOBALAMIN) 100 MCG tablet Take 100 mcg by mouth daily.     vitamin C (ASCORBIC ACID) 500 MG tablet Take 500 mg by mouth daily.     gabapentin (NEURONTIN) 600 MG tablet Take 1 tablet (600 mg total) by mouth 3 (three) times daily. 270 tablet 3   Torsemide 40 MG TABS Take 40 mg by mouth daily. 30 tablet 1   No facility-administered medications prior to visit.    No Known Allergies  Review of Systems  Constitutional:  Negative for chills, fever and malaise/fatigue.  HENT: Negative.    Eyes: Negative.   Respiratory:  Positive for cough, sputum production, shortness of breath and wheezing.   Cardiovascular:  Positive for leg swelling. Negative for chest pain and palpitations.       See HPI  Gastrointestinal:  Negative for abdominal pain, blood in stool, constipation, diarrhea, nausea and vomiting.  Genitourinary: Negative.   Musculoskeletal:        See HPI  Skin: Negative.   Neurological: Negative.   Psychiatric/Behavioral:  Negative for depression. The patient is not nervous/anxious.   All other systems reviewed and are negative.     Objective:    Physical Exam Vitals reviewed.  Constitutional:      General: She is not in acute distress.    Appearance: Normal appearance. She is obese.     Comments: Somewhat tearful during visit. In wheelchair during visit  HENT:     Head: Normocephalic.  Cardiovascular:     Rate and Rhythm:  Normal rate and regular rhythm.     Pulses: Normal pulses.     Heart sounds: Normal heart sounds.     Comments: Moderate swelling to BLE, non pitting  Pulmonary:     Effort: Pulmonary effort is normal.     Breath sounds: Wheezing present.     Comments: Diffuse wheezing noted, diminished lung sounds, consistent with patient medical history Musculoskeletal:        General: Swelling and tenderness present. No deformity or signs of injury. Normal range of motion.     Cervical back: Normal range of motion and neck supple.     Right lower leg: No edema.     Left lower leg: No edema.     Comments: Mild tenderness with palpation of BLE   Skin:    General: Skin is warm and dry.     Capillary Refill: Capillary refill takes less than 2 seconds.  Neurological:     General: No focal deficit present.     Mental Status: She is alert and oriented to person, place, and time.  Psychiatric:        Mood and Affect: Mood normal.        Behavior: Behavior normal.        Thought Content: Thought content normal.        Judgment: Judgment normal.    BP 106/68 (BP Location: Left Arm, Patient Position: Sitting)   Pulse 97   Temp (!) 97.2 F (36.2 C)   Ht 5\' 5"  (1.651 m)   LMP 02/13/2014   SpO2 96%   BMI 79.61 kg/m  Wt Readings from Last 3 Encounters:  04/17/21 (!) 478 lb 6.4 oz (217 kg)  11/26/20 (!) 480 lb (217.7 kg)  10/26/20 (!) 492 lb (223.2 kg)    Immunization History  Administered Date(s) Administered   Influenza Whole 04/25/2010   Influenza,inj,Quad PF,6+ Mos 03/07/2014, 03/10/2019   Pneumococcal Polysaccharide-23 04/13/2014   Td 08/02/2010    Diabetic Foot Exam - Simple   No data filed     Lab Results  Component Value Date   TSH 1.766 04/25/2020   Lab Results  Component Value Date   WBC 8.3 04/19/2021   HGB 15.3 (H) 04/19/2021   HCT 47.2 (H) 04/19/2021   MCV 77.5 (L) 04/19/2021   PLT 310 04/19/2021   Lab Results  Component Value Date   NA 138 04/18/2021   K 3.9  04/18/2021   CHLORIDE 107 04/07/2013   CO2 35 (H) 04/18/2021   GLUCOSE 135 (H) 04/18/2021   BUN 11 04/18/2021   CREATININE 0.77 04/18/2021   BILITOT 0.6 04/17/2021   ALKPHOS 79 04/17/2021   AST 22 04/17/2021   ALT 19 04/17/2021   PROT 8.0 04/17/2021   ALBUMIN 3.2 (L) 04/17/2021   CALCIUM 9.1 04/18/2021   ANIONGAP 11 04/18/2021   Lab Results  Component Value Date   CHOL 210 (H) 04/19/2020   CHOL 188 08/17/2010   Lab Results  Component Value Date   HDL 43 04/19/2020   HDL 43 08/17/2010   Lab Results  Component Value Date   LDLCALC 151 (H) 04/19/2020   LDLCALC 127 (H) 08/17/2010   Lab Results  Component Value Date   TRIG 90 04/19/2020   TRIG 91 08/17/2010   Lab Results  Component Value Date   CHOLHDL 4.9 (H) 04/19/2020   CHOLHDL 4.4 Ratio 08/17/2010   Lab Results  Component Value Date   HGBA1C  6.8 (H) 04/18/2021   HGBA1C 7.2 (H) 10/02/2020   HGBA1C 7.3 (A) 04/19/2020   HGBA1C 7.3 04/19/2020   HGBA1C 7.3 (A) 04/19/2020   HGBA1C 7.3 (A) 04/19/2020       Assessment & Plan:   Problem List Items Addressed This Visit       Cardiovascular and Mediastinum   Chronic diastolic CHF (congestive heart failure) ([place]) - Primary   Relevant Orders   AMB referral to CHF clinic Torsemide not increased, review of recently collected studies and PE indicates low likelihood of worsening fluid retention, will continue to monitor     Musculoskeletal and Integument   DEGENERATIVE DISC DISEASE, LUMBOSACRAL SPINE W/RADICULOPATHY   Relevant Medications   gabapentin (NEURONTIN) 800 MG tablet     Other   Shortness of breath   Relevant Orders   DG Chest 2 View Will review xray results prior to initiating treatment for possible pneumonia, per patient's verbalized concern Informed to continue taking medications as prescribed Encouraged to follow up with pulmonology   Class 3 obesity ([place]) Patient verbalized interest in weight loss options. Discussed at length patient completing  appointment with weight loss surgery. Was referred to weight loss clinic prior to pandemic, states that she will make an appointment in the upcoming weeks   Other Visit Diagnoses     Healthcare maintenance       Relevant Orders   CBC with Differential/Platelet   Comprehensive metabolic panel   Lipid panel Discussed smoking cessation  Discussed continued medication compliance  Encouraged continued diet and exercise efforts   Acute cough       Relevant Medications   benzonatate (TESSALON) 200 MG capsule   Right leg pain       Relevant Medications   gabapentin (NEURONTIN) 800 MG tablet, does increased for management of neuropathy    Follow up in 2 mths for reevaluation of symptoms and weight management, sooner as needed    I have changed Cynthia Becker's gabapentin. I am also having her start on benzonatate. Additionally, I am having her maintain her Accu-Chek FastClix Lancets, Oxycodone HCl, Fluticasone-Salmeterol, BiDil, insulin glargine, PEN NEEDLES 31GX5/16", ibuprofen, cetirizine, Accu-Chek Guide, Torsemide, albuterol, Linzess, carvedilol, naloxone, promethazine, vitamin B-12, vitamin C, QUEtiapine, cyclobenzaprine, and torsemide.  Meds ordered this encounter  Medications   benzonatate (TESSALON) 200 MG capsule    Sig: Take 1 capsule (200 mg total) by mouth 2 (two) times daily as needed for cough.    Dispense:  20 capsule    Refill:  0   gabapentin (NEURONTIN) 800 MG tablet    Sig: Take 1 tablet (800 mg total) by mouth 3 (three) times daily.    Dispense:  270 tablet    Refill:  0     Teena Dunk, NP

## 2021-05-15 LAB — COMPREHENSIVE METABOLIC PANEL
ALT: 17 IU/L (ref 0–32)
AST: 21 IU/L (ref 0–40)
Albumin/Globulin Ratio: 1 — ABNORMAL LOW (ref 1.2–2.2)
Albumin: 3.8 g/dL (ref 3.8–4.9)
Alkaline Phosphatase: 94 IU/L (ref 44–121)
BUN/Creatinine Ratio: 15 (ref 9–23)
BUN: 15 mg/dL (ref 6–24)
Bilirubin Total: 0.3 mg/dL (ref 0.0–1.2)
CO2: 29 mmol/L (ref 20–29)
Calcium: 9.2 mg/dL (ref 8.7–10.2)
Chloride: 96 mmol/L (ref 96–106)
Creatinine, Ser: 1.01 mg/dL — ABNORMAL HIGH (ref 0.57–1.00)
Globulin, Total: 3.9 g/dL (ref 1.5–4.5)
Glucose: 111 mg/dL — ABNORMAL HIGH (ref 70–99)
Potassium: 4.6 mmol/L (ref 3.5–5.2)
Sodium: 143 mmol/L (ref 134–144)
Total Protein: 7.7 g/dL (ref 6.0–8.5)
eGFR: 67 mL/min/{1.73_m2} (ref >59)

## 2021-05-15 LAB — LIPID PANEL
Chol/HDL Ratio: 4.9 ratio — ABNORMAL HIGH (ref 0.0–4.4)
Cholesterol, Total: 200 mg/dL — ABNORMAL HIGH (ref 100–199)
HDL: 41 mg/dL (ref >39)
LDL Chol Calc (NIH): 141 mg/dL — ABNORMAL HIGH (ref 0–99)
Triglycerides: 99 mg/dL (ref 0–149)
VLDL Cholesterol Cal: 18 mg/dL (ref 5–40)

## 2021-05-15 LAB — CBC WITH DIFFERENTIAL/PLATELET
Basophils Absolute: 0 10*3/uL (ref 0.0–0.2)
Basos: 1 %
EOS (ABSOLUTE): 0.3 10*3/uL (ref 0.0–0.4)
Eos: 4 %
Hematocrit: 49 % — ABNORMAL HIGH (ref 34.0–46.6)
Hemoglobin: 15.8 g/dL (ref 11.1–15.9)
Immature Grans (Abs): 0 10*3/uL (ref 0.0–0.1)
Immature Granulocytes: 0 %
Lymphocytes Absolute: 1.2 10*3/uL (ref 0.7–3.1)
Lymphs: 15 %
MCH: 24.8 pg — ABNORMAL LOW (ref 26.6–33.0)
MCHC: 32.2 g/dL (ref 31.5–35.7)
MCV: 77 fL — ABNORMAL LOW (ref 79–97)
Monocytes Absolute: 0.5 10*3/uL (ref 0.1–0.9)
Monocytes: 6 %
Neutrophils Absolute: 5.9 10*3/uL (ref 1.4–7.0)
Neutrophils: 74 %
Platelets: 233 10*3/uL (ref 150–450)
RBC: 6.36 x10E6/uL — ABNORMAL HIGH (ref 3.77–5.28)
RDW: 17.1 % — ABNORMAL HIGH (ref 11.7–15.4)
WBC: 7.9 10*3/uL (ref 3.4–10.8)

## 2021-05-16 ENCOUNTER — Inpatient Hospital Stay (HOSPITAL_COMMUNITY)
Admission: EM | Admit: 2021-05-16 | Discharge: 2021-05-25 | DRG: 193 | Disposition: A | Discharge status: Home Health | Payer: Medicaid Other | Attending: Internal Medicine | Admitting: Internal Medicine

## 2021-05-16 ENCOUNTER — Other Ambulatory Visit: Payer: Self-pay

## 2021-05-16 ENCOUNTER — Encounter (HOSPITAL_COMMUNITY): Payer: Self-pay

## 2021-05-16 ENCOUNTER — Emergency Department (HOSPITAL_COMMUNITY): Payer: Medicaid Other

## 2021-05-16 DIAGNOSIS — I10 Essential (primary) hypertension: Secondary | ICD-10-CM

## 2021-05-16 DIAGNOSIS — G4700 Insomnia, unspecified: Secondary | ICD-10-CM | POA: Diagnosis present

## 2021-05-16 DIAGNOSIS — F32A Depression, unspecified: Secondary | ICD-10-CM | POA: Diagnosis present

## 2021-05-16 DIAGNOSIS — J111 Influenza due to unidentified influenza virus with other respiratory manifestations: Secondary | ICD-10-CM | POA: Diagnosis not present

## 2021-05-16 DIAGNOSIS — R0602 Shortness of breath: Secondary | ICD-10-CM

## 2021-05-16 DIAGNOSIS — R52 Pain, unspecified: Secondary | ICD-10-CM

## 2021-05-16 DIAGNOSIS — G4733 Obstructive sleep apnea (adult) (pediatric): Secondary | ICD-10-CM | POA: Diagnosis present

## 2021-05-16 DIAGNOSIS — F419 Anxiety disorder, unspecified: Secondary | ICD-10-CM | POA: Diagnosis present

## 2021-05-16 DIAGNOSIS — Z8249 Family history of ischemic heart disease and other diseases of the circulatory system: Secondary | ICD-10-CM

## 2021-05-16 DIAGNOSIS — G8929 Other chronic pain: Secondary | ICD-10-CM | POA: Diagnosis present

## 2021-05-16 DIAGNOSIS — Z28310 Unvaccinated for covid-19: Secondary | ICD-10-CM

## 2021-05-16 DIAGNOSIS — R7401 Elevation of levels of liver transaminase levels: Secondary | ICD-10-CM

## 2021-05-16 DIAGNOSIS — I5033 Acute on chronic diastolic (congestive) heart failure: Secondary | ICD-10-CM | POA: Diagnosis not present

## 2021-05-16 DIAGNOSIS — D509 Iron deficiency anemia, unspecified: Secondary | ICD-10-CM | POA: Diagnosis present

## 2021-05-16 DIAGNOSIS — R339 Retention of urine, unspecified: Secondary | ICD-10-CM | POA: Diagnosis not present

## 2021-05-16 DIAGNOSIS — Z9981 Dependence on supplemental oxygen: Secondary | ICD-10-CM

## 2021-05-16 DIAGNOSIS — J9621 Acute and chronic respiratory failure with hypoxia: Secondary | ICD-10-CM | POA: Diagnosis present

## 2021-05-16 DIAGNOSIS — Z9049 Acquired absence of other specified parts of digestive tract: Secondary | ICD-10-CM

## 2021-05-16 DIAGNOSIS — J45909 Unspecified asthma, uncomplicated: Secondary | ICD-10-CM | POA: Diagnosis present

## 2021-05-16 DIAGNOSIS — N179 Acute kidney failure, unspecified: Secondary | ICD-10-CM | POA: Diagnosis present

## 2021-05-16 DIAGNOSIS — Z825 Family history of asthma and other chronic lower respiratory diseases: Secondary | ICD-10-CM

## 2021-05-16 DIAGNOSIS — M170 Bilateral primary osteoarthritis of knee: Secondary | ICD-10-CM

## 2021-05-16 DIAGNOSIS — Z90710 Acquired absence of both cervix and uterus: Secondary | ICD-10-CM

## 2021-05-16 DIAGNOSIS — Z833 Family history of diabetes mellitus: Secondary | ICD-10-CM

## 2021-05-16 DIAGNOSIS — J101 Influenza due to other identified influenza virus with other respiratory manifestations: Secondary | ICD-10-CM | POA: Diagnosis present

## 2021-05-16 DIAGNOSIS — E785 Hyperlipidemia, unspecified: Secondary | ICD-10-CM | POA: Diagnosis present

## 2021-05-16 DIAGNOSIS — Z7951 Long term (current) use of inhaled steroids: Secondary | ICD-10-CM

## 2021-05-16 DIAGNOSIS — T17908A Unspecified foreign body in respiratory tract, part unspecified causing other injury, initial encounter: Secondary | ICD-10-CM

## 2021-05-16 DIAGNOSIS — Z79899 Other long term (current) drug therapy: Secondary | ICD-10-CM

## 2021-05-16 DIAGNOSIS — Z20822 Contact with and (suspected) exposure to covid-19: Secondary | ICD-10-CM | POA: Diagnosis present

## 2021-05-16 DIAGNOSIS — G9341 Metabolic encephalopathy: Secondary | ICD-10-CM | POA: Diagnosis present

## 2021-05-16 DIAGNOSIS — Z801 Family history of malignant neoplasm of trachea, bronchus and lung: Secondary | ICD-10-CM

## 2021-05-16 DIAGNOSIS — I2721 Secondary pulmonary arterial hypertension: Secondary | ICD-10-CM | POA: Diagnosis present

## 2021-05-16 DIAGNOSIS — J1000 Influenza due to other identified influenza virus with unspecified type of pneumonia: Principal | ICD-10-CM | POA: Diagnosis present

## 2021-05-16 DIAGNOSIS — E119 Type 2 diabetes mellitus without complications: Secondary | ICD-10-CM | POA: Diagnosis present

## 2021-05-16 DIAGNOSIS — M1711 Unilateral primary osteoarthritis, right knee: Secondary | ICD-10-CM | POA: Diagnosis present

## 2021-05-16 DIAGNOSIS — Z6841 Body Mass Index (BMI) 40.0 and over, adult: Secondary | ICD-10-CM

## 2021-05-16 DIAGNOSIS — Z794 Long term (current) use of insulin: Secondary | ICD-10-CM

## 2021-05-16 DIAGNOSIS — I110 Hypertensive heart disease with heart failure: Secondary | ICD-10-CM | POA: Diagnosis present

## 2021-05-16 LAB — COMPREHENSIVE METABOLIC PANEL
ALT: 80 U/L — ABNORMAL HIGH (ref 0–44)
AST: 146 U/L — ABNORMAL HIGH (ref 15–41)
Albumin: 3.6 g/dL (ref 3.5–5.0)
Alkaline Phosphatase: 75 U/L (ref 38–126)
Anion gap: 11 (ref 5–15)
BUN: 21 mg/dL — ABNORMAL HIGH (ref 6–20)
CO2: 35 mmol/L — ABNORMAL HIGH (ref 22–32)
Calcium: 8.4 mg/dL — ABNORMAL LOW (ref 8.9–10.3)
Chloride: 93 mmol/L — ABNORMAL LOW (ref 98–111)
Creatinine, Ser: 1 mg/dL (ref 0.44–1.00)
GFR, Estimated: >60 mL/min (ref >60)
Glucose, Bld: 153 mg/dL — ABNORMAL HIGH (ref 70–99)
Potassium: 5 mmol/L (ref 3.5–5.1)
Sodium: 139 mmol/L (ref 135–145)
Total Bilirubin: 1 mg/dL (ref 0.3–1.2)
Total Protein: 8.7 g/dL — ABNORMAL HIGH (ref 6.5–8.1)

## 2021-05-16 LAB — HEPATITIS PANEL, ACUTE
HCV Ab: NONREACTIVE
Hep A IgM: NONREACTIVE
Hep B C IgM: NONREACTIVE
Hepatitis B Surface Ag: NONREACTIVE

## 2021-05-16 LAB — CREATININE, SERUM
Creatinine, Ser: 1.03 mg/dL — ABNORMAL HIGH (ref 0.44–1.00)
GFR, Estimated: >60 mL/min (ref >60)

## 2021-05-16 LAB — HIV ANTIBODY (ROUTINE TESTING W REFLEX): HIV Screen 4th Generation wRfx: NONREACTIVE

## 2021-05-16 LAB — CBC
HCT: 48.9 % — ABNORMAL HIGH (ref 36.0–46.0)
Hemoglobin: 15.7 g/dL — ABNORMAL HIGH (ref 12.0–15.0)
MCH: 25.2 pg — ABNORMAL LOW (ref 26.0–34.0)
MCHC: 32.1 g/dL (ref 30.0–36.0)
MCV: 78.4 fL — ABNORMAL LOW (ref 80.0–100.0)
Platelets: 246 10*3/uL (ref 150–400)
RBC: 6.24 MIL/uL — ABNORMAL HIGH (ref 3.87–5.11)
RDW: 17.8 % — ABNORMAL HIGH (ref 11.5–15.5)
WBC: 8.6 10*3/uL (ref 4.0–10.5)
nRBC: 1.3 % — ABNORMAL HIGH (ref 0.0–0.2)

## 2021-05-16 LAB — CBC WITH DIFFERENTIAL/PLATELET
Abs Immature Granulocytes: 0.05 10*3/uL (ref 0.00–0.07)
Basophils Absolute: 0.1 10*3/uL (ref 0.0–0.1)
Basophils Relative: 1 %
Eosinophils Absolute: 0 10*3/uL (ref 0.0–0.5)
Eosinophils Relative: 0 %
HCT: 47.8 % — ABNORMAL HIGH (ref 36.0–46.0)
Hemoglobin: 15.4 g/dL — ABNORMAL HIGH (ref 12.0–15.0)
Immature Granulocytes: 1 %
Lymphocytes Relative: 12 %
Lymphs Abs: 1.1 10*3/uL (ref 0.7–4.0)
MCH: 24.9 pg — ABNORMAL LOW (ref 26.0–34.0)
MCHC: 32.2 g/dL (ref 30.0–36.0)
MCV: 77.2 fL — ABNORMAL LOW (ref 80.0–100.0)
Monocytes Absolute: 0.9 10*3/uL (ref 0.1–1.0)
Monocytes Relative: 10 %
Neutro Abs: 6.7 10*3/uL (ref 1.7–7.7)
Neutrophils Relative %: 76 %
Platelets: 270 10*3/uL (ref 150–400)
RBC: 6.19 MIL/uL — ABNORMAL HIGH (ref 3.87–5.11)
RDW: 17.7 % — ABNORMAL HIGH (ref 11.5–15.5)
WBC: 8.8 10*3/uL (ref 4.0–10.5)
nRBC: 0.6 % — ABNORMAL HIGH (ref 0.0–0.2)

## 2021-05-16 LAB — BLOOD GAS, VENOUS
Acid-Base Excess: 8.9 mmol/L — ABNORMAL HIGH (ref 0.0–2.0)
Bicarbonate: 37 mmol/L — ABNORMAL HIGH (ref 20.0–28.0)
FIO2: 21
O2 Saturation: 94.7 %
Patient temperature: 98.6
pCO2, Ven: 66.4 mmHg — ABNORMAL HIGH (ref 44.0–60.0)
pH, Ven: 7.365 (ref 7.250–7.430)
pO2, Ven: 82 mmHg — ABNORMAL HIGH (ref 32.0–45.0)

## 2021-05-16 LAB — BRAIN NATRIURETIC PEPTIDE: B Natriuretic Peptide: 118.4 pg/mL — ABNORMAL HIGH (ref 0.0–100.0)

## 2021-05-16 LAB — RESP PANEL BY RT-PCR (FLU A&B, COVID) ARPGX2
Influenza A by PCR: POSITIVE — AB
Influenza B by PCR: NEGATIVE
SARS Coronavirus 2 by RT PCR: NEGATIVE

## 2021-05-16 LAB — GLUCOSE, CAPILLARY: Glucose-Capillary: 131 mg/dL — ABNORMAL HIGH (ref 70–99)

## 2021-05-16 LAB — HEMOGLOBIN A1C
Hgb A1c MFr Bld: 6.9 % — ABNORMAL HIGH (ref 4.8–5.6)
Mean Plasma Glucose: 151.33 mg/dL

## 2021-05-16 LAB — CBG MONITORING, ED
Glucose-Capillary: 146 mg/dL — ABNORMAL HIGH (ref 70–99)
Glucose-Capillary: 127 mg/dL — ABNORMAL HIGH (ref 70–99)

## 2021-05-16 MED ORDER — IBUPROFEN 400 MG PO TABS
400.0000 mg | ORAL_TABLET | Freq: Four times a day (QID) | ORAL | Status: DC | PRN
  Administered 2021-05-18 – 2021-05-25 (×11): 400 mg via ORAL
  Filled 2021-05-16: qty 2
  Filled 2021-05-16: qty 1
  Filled 2021-05-16 (×2): qty 2
  Filled 2021-05-16: qty 1
  Filled 2021-05-16: qty 2
  Filled 2021-05-16 (×3): qty 1
  Filled 2021-05-16: qty 2
  Filled 2021-05-16: qty 1

## 2021-05-16 MED ORDER — DOCUSATE SODIUM 100 MG PO CAPS
100.0000 mg | ORAL_CAPSULE | Freq: Two times a day (BID) | ORAL | Status: DC
  Administered 2021-05-16 – 2021-05-24 (×15): 100 mg via ORAL
  Filled 2021-05-16 (×18): qty 1

## 2021-05-16 MED ORDER — ONDANSETRON HCL 4 MG/2ML IJ SOLN
4.0000 mg | Freq: Four times a day (QID) | INTRAMUSCULAR | Status: DC | PRN
  Administered 2021-05-16 – 2021-05-20 (×5): 4 mg via INTRAVENOUS
  Filled 2021-05-16 (×5): qty 2

## 2021-05-16 MED ORDER — OXYCODONE HCL 5 MG PO TABS
5.0000 mg | ORAL_TABLET | ORAL | Status: DC | PRN
  Administered 2021-05-16 – 2021-05-25 (×25): 5 mg via ORAL
  Filled 2021-05-16 (×25): qty 1

## 2021-05-16 MED ORDER — IOHEXOL 350 MG/ML SOLN
100.0000 mL | Freq: Once | INTRAVENOUS | Status: AC | PRN
  Administered 2021-05-16: 100 mL via INTRAVENOUS

## 2021-05-16 MED ORDER — SODIUM CHLORIDE (PF) 0.9 % IJ SOLN
INTRAMUSCULAR | Status: AC
  Filled 2021-05-16: qty 50

## 2021-05-16 MED ORDER — ENOXAPARIN SODIUM 40 MG/0.4ML IJ SOSY
40.0000 mg | PREFILLED_SYRINGE | Freq: Two times a day (BID) | INTRAMUSCULAR | Status: DC
  Administered 2021-05-16 – 2021-05-25 (×18): 40 mg via SUBCUTANEOUS
  Filled 2021-05-16 (×17): qty 0.4

## 2021-05-16 MED ORDER — ALBUTEROL SULFATE (2.5 MG/3ML) 0.083% IN NEBU: 2.5000 mg | INHALATION_SOLUTION | RESPIRATORY_TRACT | Status: DC | PRN

## 2021-05-16 MED ORDER — OSELTAMIVIR PHOSPHATE 75 MG PO CAPS
75.0000 mg | ORAL_CAPSULE | Freq: Two times a day (BID) | ORAL | Status: AC
  Administered 2021-05-16 – 2021-05-21 (×10): 75 mg via ORAL
  Filled 2021-05-16 (×11): qty 1

## 2021-05-16 MED ORDER — SODIUM CHLORIDE 0.9 % IV SOLN: Freq: Once | INTRAVENOUS | Status: AC

## 2021-05-16 MED ORDER — ALBUTEROL SULFATE HFA 108 (90 BASE) MCG/ACT IN AERS
2.0000 | INHALATION_SPRAY | Freq: Once | RESPIRATORY_TRACT | Status: AC
  Administered 2021-05-16: 2 via RESPIRATORY_TRACT
  Filled 2021-05-16: qty 6.7

## 2021-05-16 MED ORDER — OSELTAMIVIR PHOSPHATE 75 MG PO CAPS
75.0000 mg | ORAL_CAPSULE | Freq: Once | ORAL | Status: AC
  Administered 2021-05-16: 75 mg via ORAL
  Filled 2021-05-16: qty 1

## 2021-05-16 MED ORDER — INSULIN ASPART 100 UNIT/ML IJ SOLN
0.0000 [IU] | Freq: Three times a day (TID) | INTRAMUSCULAR | Status: DC
  Administered 2021-05-17: 2 [IU] via SUBCUTANEOUS
  Administered 2021-05-17 – 2021-05-19 (×3): 3 [IU] via SUBCUTANEOUS
  Administered 2021-05-19: 5 [IU] via SUBCUTANEOUS
  Administered 2021-05-19 – 2021-05-20 (×2): 2 [IU] via SUBCUTANEOUS
  Administered 2021-05-20: 16:00:00 3 [IU] via SUBCUTANEOUS
  Administered 2021-05-20 – 2021-05-21 (×2): 5 [IU] via SUBCUTANEOUS
  Administered 2021-05-21 (×2): 2 [IU] via SUBCUTANEOUS
  Administered 2021-05-22 (×2): 3 [IU] via SUBCUTANEOUS
  Administered 2021-05-22: 2 [IU] via SUBCUTANEOUS
  Administered 2021-05-23: 3 [IU] via SUBCUTANEOUS
  Administered 2021-05-23: 2 [IU] via SUBCUTANEOUS
  Administered 2021-05-23 – 2021-05-24 (×3): 3 [IU] via SUBCUTANEOUS
  Administered 2021-05-24: 5 [IU] via SUBCUTANEOUS
  Administered 2021-05-25: 8 [IU] via SUBCUTANEOUS
  Administered 2021-05-25: 2 [IU] via SUBCUTANEOUS
  Administered 2021-05-25: 5 [IU] via SUBCUTANEOUS
  Filled 2021-05-16: qty 0.15

## 2021-05-16 MED ORDER — ONDANSETRON HCL 4 MG PO TABS: 4.0000 mg | ORAL_TABLET | Freq: Four times a day (QID) | ORAL | Status: DC | PRN

## 2021-05-16 MED ORDER — GUAIFENESIN ER 600 MG PO TB12
600.0000 mg | ORAL_TABLET | Freq: Two times a day (BID) | ORAL | Status: DC
  Administered 2021-05-16 – 2021-05-25 (×18): 600 mg via ORAL
  Filled 2021-05-16 (×18): qty 1

## 2021-05-16 MED ORDER — INSULIN ASPART 100 UNIT/ML IJ SOLN
0.0000 [IU] | Freq: Every day | INTRAMUSCULAR | Status: DC
  Administered 2021-05-18: 2 [IU] via SUBCUTANEOUS
  Filled 2021-05-16: qty 0.05

## 2021-05-16 NOTE — H&P (Signed)
History and Physical    Cynthia Becker WUJ:[id number] DOB: 1970/10/07 DOA: 05/16/2021  PCP: [name], NP  Patient coming from: Home  Chief Complaint: generalized weakness and incoherence  HPI: Cynthia Becker is a 50 y.o. female with medical history significant of HTN, PAH, diastolic HF, chronic pain, morbid obesity. History from husband. Reports that she had cold symptoms for the last couple of days. She saw her doctor and it was recommended that she go get a chest xray. She was unable to get her xray. She did complain of being short of breath. She seemed slower and less responsive yesterday. Generally "under the weather." No N/V/D, fevers. Sitting on edge of bed and slipped off last night around 6p. No head injury or LOC. She was able to get back in the bed with assistance. So she went to sleep and seemed ok. This morning where her husband checked on her, she seemed out of breath. He gave her oxygen and she didn't seem to respond well. She seemed incoherent. He became concerned and brought her to the ED.   ED Course: Found to be flu A positive. Started on tamiflu. [facility] called for admission.   Review of Systems:  Review of systems is otherwise negative for all not mentioned in HPI.   PMHx Past Medical History:  Diagnosis Date   Anemia    iv iron treatment dec 2014/ see oncology for this last in dec 2014   Anxiety    Arthritis    knee, hands   Asthma    seasonal related/ albuterol used when uri   CHF (congestive heart failure) (HCC)    CN (constipation) 09/14/2014   DEGENERATIVE DISC DISEASE, LUMBOSACRAL SPINE W/RADICULOPATHY 11/01/2009   Qualifier: Diagnosis of  By: Jorene Minors, Scott     Depression    Diabetes ([place])    H/O dizziness    Hx   Headache(784.0)    otc med prn   High blood pressure    HYPERLIPIDEMIA 02/13/2010   Qualifier: Diagnosis of  By: Jorene Minors, Scott     INSOMNIA 08/24/2008   Qualifier: Diagnosis of  By: Radene Ou MD, [place]     Morbid obesity ([place])  05/24/2008   Qualifier: Diagnosis of  By: Radene Ou MD, [place]     SVD (spontaneous vaginal delivery)    x 3    PSHx Past Surgical History:  Procedure Laterality Date   ABDOMINAL HYSTERECTOMY  03/13/2014   total    BILATERAL SALPINGECTOMY Bilateral 04/12/2014   Procedure: BILATERAL SALPINGECTOMY;  Surgeon: Woodroe Mode, MD;  Location: [facility];  Service: Gynecology;  Laterality: Bilateral;   CARPAL TUNNEL RELEASE  08/30/2011   Procedure: CARPAL TUNNEL RELEASE;  Surgeon: Wynonia Sours, MD;  Location: [facility];  Service: Orthopedics;  Laterality: Right;   CHOLECYSTECTOMY  10/11   lap choli   GALLBLADDER SURGERY  04/04/2010   HYSTEROSCOPY N/A 02/08/2014   Procedure: HYSTEROSCOPY;  Surgeon: Woodroe Mode, MD;  Location: [facility];  Service: Gynecology;  Laterality: N/A;   HYSTEROSCOPY WITH NOVASURE N/A 02/08/2014   Procedure: ATTEMPTED NOVASURE ABLATION;  Surgeon: Woodroe Mode, MD;  Location: [facility];  Service: Gynecology;  Laterality: N/A;   INTRAUTERINE DEVICE INSERTION     07/19/2013   SEPTOPLASTY N/A 09/08/2013   Procedure: SEPTOPLASTY;  Surgeon: Ruby Cola, MD;  Location: [facility];  Service: ENT;  Laterality: N/A;   SPINE SURGERY     TONSILLECTOMY     TONSILLECTOMY AND ADENOIDECTOMY Bilateral 09/08/2013  Procedure: TONSILLECTOMY ;  Surgeon: Ruby Cola, MD;  Location: [facility];  Service: ENT;  Laterality: Bilateral;   TUBAL LIGATION     VAGINAL HYSTERECTOMY N/A 04/12/2014   Procedure: LAPAROSCOPIC ASSISTED VAGINAL HYSTERECTOMY;  Surgeon: Woodroe Mode, MD;  Location: [facility];  Service: Gynecology;  Laterality: N/A;   WISDOM TOOTH EXTRACTION      SocHx  reports that she quit smoking about 12 months ago. Her smoking use included cigarettes. She smoked an average of .25 packs per day. She has never used smokeless tobacco. She reports that she does not drink alcohol and does not use drugs.  No Known Allergies  FamHx Family History  Problem Relation Age of Onset    Diabetes Mother    Hypertension Father    Heart disease Other    Diabetes Other    Hypertension Other    Alcohol abuse Other    Hypertension Brother    Asthma Brother    Hypertension Brother    Asthma Brother    COPD Maternal Grandmother    Lung cancer Maternal Grandmother    Asthma Paternal Grandmother    Asthma Paternal Aunt     Prior to Admission medications   Medication Sig Start Date End Date Taking? Authorizing Provider  ACCU-CHEK FASTCLIX LANCETS MISC USE TO CHECK BLOOD SUGAR TWICE DAILY 03/23/18   [provider]  ACCU-CHEK GUIDE test strip 1 each by Other route 3 (three) times daily. Use as instructed 01/08/21 01/08/22  [name], NP  albuterol (PROVENTIL) (2.5 MG/3ML) 0.083% nebulizer solution USE 1 VIAL IN NEBULIZER EVERY 6 HOURS AS NEEDED FOR WHEEZING OR SHORTNESS OF BREATH 03/19/21   [name], NP  benzonatate (TESSALON) 200 MG capsule Take 1 capsule (200 mg total) by mouth 2 (two) times daily as needed for cough. 05/14/21   [name] I, NP  carvedilol (COREG) 3.125 MG tablet Take 1 tablet (3.125 mg total) by mouth 2 (two) times daily with a meal. 04/11/21 06/10/21  Patwardhan, Reynold Bowen, MD  cetirizine (ZYRTEC ALLERGY) 10 MG tablet Take 1 tablet (10 mg total) by mouth daily. 01/08/21 01/08/22  [name], NP  cyclobenzaprine (FLEXERIL) 5 MG tablet Take 1 tablet by mouth three times daily as needed for muscle spasm 05/02/21   [name], NP  Fluticasone-Salmeterol (ADVAIR DISKUS) 250-50 MCG/DOSE AEPB Inhale 1 puff into the lungs 2 (two) times daily. 12/02/19   Julian Hy, DO  gabapentin (NEURONTIN) 800 MG tablet Take 1 tablet (800 mg total) by mouth 3 (three) times daily. 05/14/21 08/12/21  [name] I, NP  ibuprofen (ADVIL) 800 MG tablet Take 800 mg by mouth every 8 (eight) hours as needed for moderate pain. 12/07/20   [provider]  insulin glargine (LANTUS) 100 UNIT/ML Solostar Pen Inject 20 Units into the skin at bedtime.  12/27/20   [name], NP  Insulin Pen Needle (PEN NEEDLES 31GX5/16") 31G X 8 MM MISC 1 pen by Does not apply route at bedtime. 12/27/20   [name], NP  isosorbide-hydrALAZINE (BIDIL) 20-37.5 MG tablet Take 1 tablet by mouth in the morning and at bedtime. 01/14/20   Patwardhan, Reynold Bowen, MD  LINZESS 290 MCG CAPS capsule TAKE 1 CAPSULE BY MOUTH ONCE DAILY BEFORE BREAKFAST 04/02/21   [name], NP  naloxone [facility]) nasal spray 4 mg/0.1 mL Place 1 spray into the nose as needed. 04/13/21   [provider]  Oxycodone HCl 10 MG TABS Take 10 mg  by mouth every 4 (four) hours.    [provider]  promethazine (PHENERGAN) 25 MG tablet Take 25 mg by mouth every 8 (eight) hours as needed for nausea or vomiting.    [provider]  QUEtiapine (SEROQUEL) 400 MG tablet Take 400 mg by mouth at bedtime.    [provider]  torsemide (DEMADEX) 20 MG tablet Take 2 tablets by mouth once daily 05/10/21   [name], NP  Torsemide 40 MG TABS Take 40 mg by mouth daily. 01/11/21 04/09/21  [name], NP  vitamin B-12 (CYANOCOBALAMIN) 100 MCG tablet Take 100 mcg by mouth daily.    [provider]  vitamin C (ASCORBIC ACID) 500 MG tablet Take 500 mg by mouth daily.    [provider]    Physical Exam: Vitals:   05/16/21 1152 05/16/21 1153 05/16/21 1200 05/16/21 1230  BP:  137/84 (!) 131/91   Pulse: 97 93  98  Resp:  (!) 21  (!) 22  Temp:      TempSrc:      SpO2: 99% 100%  90%  Weight:      Height:        General: 50 y.o. female resting in bed in NAD Eyes: PERRL, normal sclera ENMT: Nares patent w/o discharge, orophaynx clear, dentition normal, ears w/o discharge/lesions/ulcers Neck: Supple, trachea midline Cardiovascular: RRR, +S1, S2, no m/g/r, equal pulses throughout Respiratory: decreased at bases, slightly increased WOB on 3L [place] GI: BS+, morbidly obese, NT, no masses noted, no organomegaly noted MSK: No e/c/c Skin: No rashes,  bruises, ulcerations noted Neuro: A&O x 3, no focal deficits Psyc: Seems slightly confused, slow on response, tearful but cooperative  Labs on Admission: I have personally reviewed following labs and imaging studies  CBC: Recent Labs  Lab 05/14/21 1246 05/16/21 1100  WBC 7.9 8.8  NEUTROABS 5.9 6.7  HGB 15.8 15.4*  HCT 49.0* 47.8*  MCV 77* 77.2*  PLT 233 160   Basic Metabolic Panel: Recent Labs  Lab 05/14/21 1246 05/16/21 1100  NA 143 139  K 4.6 5.0  CL 96 93*  CO2 29 35*  GLUCOSE 111* 153*  BUN 15 21*  CREATININE 1.01* 1.00  CALCIUM 9.2 8.4*   GFR: Estimated Creatinine Clearance: 125 mL/min (by C-G formula based on SCr of 1 mg/dL). Liver Function Tests: Recent Labs  Lab 05/14/21 1246 05/16/21 1100  AST 21 146*  ALT 17 80*  ALKPHOS 94 75  BILITOT 0.3 1.0  PROT 7.7 8.7*  ALBUMIN 3.8 3.6   No results for input(s): LIPASE, AMYLASE in the last 168 hours. No results for input(s): AMMONIA in the last 168 hours. Coagulation Profile: No results for input(s): INR, PROTIME in the last 168 hours. Cardiac Enzymes: No results for input(s): CKTOTAL, CKMB, CKMBINDEX, TROPONINI in the last 168 hours. BNP (last 3 results) No results for input(s): PROBNP in the last 8760 hours. HbA1C: No results for input(s): HGBA1C in the last 72 hours. CBG: Recent Labs  Lab 05/16/21 1057  GLUCAP 146*   Lipid Profile: Recent Labs    05/14/21 1246  CHOL 200*  HDL 41  LDLCALC 141*  TRIG 99  CHOLHDL 4.9*   Thyroid Function Tests: No results for input(s): TSH, T4TOTAL, FREET4, T3FREE, THYROIDAB in the last 72 hours. Anemia Panel: No results for input(s): VITAMINB12, FOLATE, FERRITIN, TIBC, IRON, RETICCTPCT in the last 72 hours. Urine analysis:    Component Value Date/Time   COLORURINE YELLOW 04/17/2021 [place]  CLOUDY (A) 04/17/2021 1208   LABSPEC 1.028 04/17/2021 1208   PHURINE 6.0 04/17/2021 1208   GLUCOSEU NEGATIVE 04/17/2021 1208   HGBUR NEGATIVE 04/17/2021  1208   HGBUR negative 08/02/2010 1448   BILIRUBINUR NEGATIVE 04/17/2021 1208   BILIRUBINUR neg 04/19/2020 [place] 04/17/2021 1208   PROTEINUR 30 (A) 04/17/2021 1208   UROBILINOGEN 0.2 04/19/2020 1454   UROBILINOGEN 1.0 02/21/2012 1647   NITRITE NEGATIVE 04/17/2021 1208   LEUKOCYTESUR TRACE (A) 04/17/2021 1208    Radiological Exams on Admission: No results found.  EKG: None obtained in ED  Assessment/Plan Flu A positive Generalized weakness Acute metabolic encephalopathy secondary to above Acute on chronic hypoxic respiratory failure     - place in obs, med-surg     - continue tamiflu     - w/ O2 as able; baseline is 2L [place]     - PRN nebs, guaifenesin     - fluids     - PT eval  Morbid obesity     - counsel on diet, lifestyle changes  DM2     - SSI, DM diet, A1c, glucose checks  HTN Chronic diastolic HF Pulmonary artery hypertension      - hold diuretic today; resume in AM (getting fluids today)      - otherwise, continue home regimen  Elevated LFTs     - likely as a function of above     - check hepatitis panel  HLD     - hold home statin d/t elevated LFTs  Chronic pain     - some confusion, will need to watch narcotics  DVT prophylaxis: lovenox  Code Status: FULL  Family Communication: w/ husband by phone  Consults called: None   Status is: Observation  The patient remains OBS appropriate and will d/c before 2 midnights.  Jonnie Finner DO [facility]  If 7PM-7AM, please contact night-coverage [email or web address]  05/16/2021, 1:02 PM

## 2021-05-16 NOTE — ED Triage Notes (Signed)
Pt brought in by ems for SOB. Pt hx of asthma, copd, and chf. Per ems, pt has been non-compliant w/ medications for an unknown amount of time. Pt unable to voice when she started feeling bad.  Pt on 4L [place] by ems upon arrival.

## 2021-05-16 NOTE — ED Provider Notes (Signed)
[facility] DEPT Provider Note   CSN: [id number] Arrival date & time: 05/16/21  1000     History Chief Complaint  Patient presents with   Shortness of Cynthia Becker is a 50 y.o. female presenting for evaluation of shortness of breath.  Patient states she has had shortness of breath for several weeks, however this worsened today.  She states she has needed to increase her oxygen at home due to the amount of shortness of breath she is having.  She is unable to walk due to how short of breath she is.  She is not taking her home medications as prescribed, not for any particular reason.  She denies fever.  She reports a nonproductive cough.  She has nausea without vomiting.  She reports upper abdominal pain.  No change in urination or bowel movements.  She denies sick contacts.  She is not vaccinated for COVID or flu.  Additional history taken chart reviewed.  Patient with a history of anxiety, asthma, CHF, depression, diabetes, hypertension, hyperlipidemia, morbid obesity.  I reviewed recent hospitalization during which patient was put on BiPAP.  On discharge, patient had a documented ambulatory sat of 88%.   HPI     Past Medical History:  Diagnosis Date   Anemia    iv iron treatment dec 2014/ see oncology for this last in dec 2014   Anxiety    Arthritis    knee, hands   Asthma    seasonal related/ albuterol used when uri   CHF (congestive heart failure) (HCC)    CN (constipation) 09/14/2014   DEGENERATIVE DISC DISEASE, LUMBOSACRAL SPINE W/RADICULOPATHY 11/01/2009   Qualifier: Diagnosis of  By: Jorene Minors, Scott     Depression    Diabetes ([place])    H/O dizziness    Hx   Headache(784.0)    otc med prn   High blood pressure    HYPERLIPIDEMIA 02/13/2010   Qualifier: Diagnosis of  By: Jorene Minors, Scott     INSOMNIA 08/24/2008   Qualifier: Diagnosis of  By: Radene Ou MD, [place]     Morbid obesity ([place]) 05/24/2008   Qualifier: Diagnosis of   By: Radene Ou MD, [place]     SVD (spontaneous vaginal delivery)    x 3    Patient Active Problem List   Diagnosis Date Noted   Acute on chronic respiratory failure with hypoxia and hypercapnia ([place]) 04/17/2021   Abnormal CT of brain 04/17/2021   Polycythemia 04/17/2021   Chronic back pain 04/17/2021   Leukocytosis 04/17/2021   Episode of unresponsiveness 04/17/2021   Acute respiratory failure with hypoxia (HCC) 10/02/2020   Chronic diastolic CHF (congestive heart failure) ([place]) 04/29/2020   Essential hypertension 04/25/2020   Community acquired bacterial pneumonia 04/25/2020   Class 3 obesity ([place]) 04/25/2020   Diabetes mellitus due to underlying condition with diabetic autonomic neuropathy, unspecified whether long term insulin use ([place]) 04/24/2020   Acute respiratory failure ([place]) 04/24/2020   Asthmatic bronchitis 03/21/2020   Nocturnal hypoxia 03/21/2020   OSA (obstructive sleep apnea) 12/22/2019   Shortness of breath 08/09/2019   No-show for appointment 06/30/2019   Morbid obesity with BMI of 60.0-69.9, adult ([place]) 09/14/2014   Iron deficiency anemia due to chronic blood loss 09/14/2014   CN (constipation) 09/14/2014   Postoperative state 04/12/2014   Uterine fibroid 03/17/2014   DUB (dysfunctional uterine bleeding) 01/27/2014   Iron deficiency anemia due to chronic blood loss 04/09/2013   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  Menometrorrhagia 04/09/2013   Left hand pain 09/18/2011   High blood pressure    ANEMIA, IRON DEFICIENCY 08/06/2010   CHOLECYSTECTOMY, HX OF 04/09/2010   Dyslipidemia 02/13/2010   DEGENERATIVE DISC DISEASE, LUMBOSACRAL SPINE W/RADICULOPATHY 11/01/2009   BACK PAIN, LUMBAR, WITH RADICULOPATHY 10/30/2009   TRIGGER FINGER 02/03/2009   Carpal tunnel syndrome 12/21/2008   Headache(784.0) 11/09/2008   Depression 10/04/2008   URI 08/24/2008   INSOMNIA 08/24/2008   Asthma 06/06/2008   Morbid obesity ([place]) 05/24/2008   ESSENTIAL HYPERTENSION  05/24/2008    Past Surgical History:  Procedure Laterality Date   ABDOMINAL HYSTERECTOMY  03/13/2014   total    BILATERAL SALPINGECTOMY Bilateral 04/12/2014   Procedure: BILATERAL SALPINGECTOMY;  Surgeon: Woodroe Mode, MD;  Location: [facility];  Service: Gynecology;  Laterality: Bilateral;   CARPAL TUNNEL RELEASE  08/30/2011   Procedure: CARPAL TUNNEL RELEASE;  Surgeon: Wynonia Sours, MD;  Location: [facility];  Service: Orthopedics;  Laterality: Right;   CHOLECYSTECTOMY  10/11   lap choli   GALLBLADDER SURGERY  04/04/2010   HYSTEROSCOPY N/A 02/08/2014   Procedure: HYSTEROSCOPY;  Surgeon: Woodroe Mode, MD;  Location: [facility];  Service: Gynecology;  Laterality: N/A;   HYSTEROSCOPY WITH NOVASURE N/A 02/08/2014   Procedure: ATTEMPTED NOVASURE ABLATION;  Surgeon: Woodroe Mode, MD;  Location: [facility];  Service: Gynecology;  Laterality: N/A;   INTRAUTERINE DEVICE INSERTION     07/19/2013   SEPTOPLASTY N/A 09/08/2013   Procedure: SEPTOPLASTY;  Surgeon: Ruby Cola, MD;  Location: [facility];  Service: ENT;  Laterality: N/A;   SPINE SURGERY     TONSILLECTOMY     TONSILLECTOMY AND ADENOIDECTOMY Bilateral 09/08/2013   Procedure: TONSILLECTOMY ;  Surgeon: Ruby Cola, MD;  Location: [facility];  Service: ENT;  Laterality: Bilateral;   TUBAL LIGATION     VAGINAL HYSTERECTOMY N/A 04/12/2014   Procedure: LAPAROSCOPIC ASSISTED VAGINAL HYSTERECTOMY;  Surgeon: Woodroe Mode, MD;  Location: [facility];  Service: Gynecology;  Laterality: N/A;   WISDOM TOOTH EXTRACTION       OB History     Gravida  3   Para  3   Term  3   Preterm  0   AB  0   Living  3      SAB  0   IAB  0   Ectopic  0   Multiple  0   Live Births              Family History  Problem Relation Age of Onset   Diabetes Mother    Hypertension Father    Heart disease Other    Diabetes Other    Hypertension Other    Alcohol abuse Other    Hypertension Brother    Asthma Brother    Hypertension Brother     Asthma Brother    COPD Maternal Grandmother    Lung cancer Maternal Grandmother    Asthma Paternal Grandmother    Asthma Paternal Aunt     Social History   Tobacco Use   Smoking status: Former    Packs/day: 0.25    Types: Cigarettes    Quit date: 04/24/2020    Years since quitting: 1.0   Smokeless tobacco: Never   Tobacco comments:    4-5 cigarettes a day  Vaping Use   Vaping Use: Never used  Substance Use Topics   Alcohol use: No   Drug use: No    Home Medications Prior to Admission medications  Medication Sig Start Date End Date Taking? Authorizing Provider  ACCU-CHEK FASTCLIX LANCETS MISC USE TO CHECK BLOOD SUGAR TWICE DAILY 03/23/18   [provider]  ACCU-CHEK GUIDE test strip 1 each by Other route 3 (three) times daily. Use as instructed 01/08/21 01/08/22  [name], NP  albuterol (PROVENTIL) (2.5 MG/3ML) 0.083% nebulizer solution USE 1 VIAL IN NEBULIZER EVERY 6 HOURS AS NEEDED FOR WHEEZING OR SHORTNESS OF BREATH 03/19/21   [name], NP  benzonatate (TESSALON) 200 MG capsule Take 1 capsule (200 mg total) by mouth 2 (two) times daily as needed for cough. 05/14/21   [name] I, NP  carvedilol (COREG) 3.125 MG tablet Take 1 tablet (3.125 mg total) by mouth 2 (two) times daily with a meal. 04/11/21 06/10/21  Patwardhan, Reynold Bowen, MD  cetirizine (ZYRTEC ALLERGY) 10 MG tablet Take 1 tablet (10 mg total) by mouth daily. 01/08/21 01/08/22  [name], NP  cyclobenzaprine (FLEXERIL) 5 MG tablet Take 1 tablet by mouth three times daily as needed for muscle spasm 05/02/21   [name], NP  Fluticasone-Salmeterol (ADVAIR DISKUS) 250-50 MCG/DOSE AEPB Inhale 1 puff into the lungs 2 (two) times daily. 12/02/19   Julian Hy, DO  gabapentin (NEURONTIN) 800 MG tablet Take 1 tablet (800 mg total) by mouth 3 (three) times daily. 05/14/21 08/12/21  [name] I, NP  ibuprofen (ADVIL) 800 MG tablet Take 800 mg by mouth every 8 (eight) hours as needed for  moderate pain. 12/07/20   [provider]  insulin glargine (LANTUS) 100 UNIT/ML Solostar Pen Inject 20 Units into the skin at bedtime. 12/27/20   [name], NP  Insulin Pen Needle (PEN NEEDLES 31GX5/16") 31G X 8 MM MISC 1 pen by Does not apply route at bedtime. 12/27/20   [name], NP  isosorbide-hydrALAZINE (BIDIL) 20-37.5 MG tablet Take 1 tablet by mouth in the morning and at bedtime. 01/14/20   Patwardhan, Reynold Bowen, MD  LINZESS 290 MCG CAPS capsule TAKE 1 CAPSULE BY MOUTH ONCE DAILY BEFORE BREAKFAST 04/02/21   [name], NP  naloxone [facility]) nasal spray 4 mg/0.1 mL Place 1 spray into the nose as needed. 04/13/21   [provider]  Oxycodone HCl 10 MG TABS Take 10 mg by mouth every 4 (four) hours.    [provider]  promethazine (PHENERGAN) 25 MG tablet Take 25 mg by mouth every 8 (eight) hours as needed for nausea or vomiting.    [provider]  QUEtiapine (SEROQUEL) 400 MG tablet Take 400 mg by mouth at bedtime.    [provider]  torsemide (DEMADEX) 20 MG tablet Take 2 tablets by mouth once daily 05/10/21   [name], NP  Torsemide 40 MG TABS Take 40 mg by mouth daily. 01/11/21 04/09/21  [name], NP  vitamin B-12 (CYANOCOBALAMIN) 100 MCG tablet Take 100 mcg by mouth daily.    [provider]  vitamin C (ASCORBIC ACID) 500 MG tablet Take 500 mg by mouth daily.    [provider]    Allergies    Patient has no known allergies.  Review of Systems   Review of Systems  Respiratory:  Positive for cough and shortness of breath.   Gastrointestinal:  Positive for abdominal pain and nausea.  All other systems reviewed and are negative.  Physical Exam Updated Vital Signs BP (!) 131/91   Pulse 93   Temp 98.5 F (36.9 C) (Oral)   Resp Marland Kitchen)  21   Ht 5\' 5"  (1.651 m)   Wt (!) 212 kg   LMP 02/13/2014   SpO2 100%   BMI 77.78 kg/m   Physical Exam Vitals and nursing note reviewed.  Constitutional:       General: She is not in acute distress.    Appearance: Normal appearance.  HENT:     Head: Normocephalic and atraumatic.     Mouth/Throat:     Mouth: Mucous membranes are dry.  Eyes:     Conjunctiva/sclera: Conjunctivae normal.     Pupils: Pupils are equal, round, and reactive to light.  Cardiovascular:     Rate and Rhythm: Normal rate and regular rhythm.     Pulses: Normal pulses.  Pulmonary:     Effort: Pulmonary effort is normal. Tachypnea present. No respiratory distress.     Breath sounds: Decreased breath sounds present. No wheezing.     Comments: Mildly tachypneic. Diminished lung sounds throughout, likely in part due to body habitus Abdominal:     General: There is no distension.     Palpations: Abdomen is soft. There is no mass.     Tenderness: There is no abdominal tenderness. There is no guarding or rebound.     Comments: No ttp of the abd on my exam  Musculoskeletal:        General: Normal range of motion.     Cervical back: Normal range of motion and neck supple.     Right lower leg: No edema.     Left lower leg: No edema.  Skin:    General: Skin is warm and dry.     Capillary Refill: Capillary refill takes less than 2 seconds.  Neurological:     Mental Status: She is alert and oriented to person, place, and time.  Psychiatric:        Mood and Affect: Mood and affect normal.        Speech: Speech normal.        Behavior: Behavior normal.    ED Results / Procedures / Treatments   Labs (all labs ordered are listed, but only abnormal results are displayed) Labs Reviewed  RESP PANEL BY RT-PCR (FLU A&B, COVID) ARPGX2 - Abnormal; Notable for the following components:      Result Value   Influenza A by PCR POSITIVE (*)    All other components within normal limits  CBC WITH DIFFERENTIAL/PLATELET - Abnormal; Notable for the following components:   RBC 6.19 (*)    Hemoglobin 15.4 (*)    HCT 47.8 (*)    MCV 77.2 (*)    MCH 24.9 (*)    RDW 17.7 (*)    nRBC 0.6  (*)    All other components within normal limits  COMPREHENSIVE METABOLIC PANEL - Abnormal; Notable for the following components:   Chloride 93 (*)    CO2 35 (*)    Glucose, Bld 153 (*)    BUN 21 (*)    Calcium 8.4 (*)    Total Protein 8.7 (*)    AST 146 (*)    ALT 80 (*)    All other components within normal limits  BRAIN NATRIURETIC PEPTIDE - Abnormal; Notable for the following components:   B Natriuretic Peptide 118.4 (*)    All other components within normal limits  BLOOD GAS, VENOUS - Abnormal; Notable for the following components:   pCO2, Ven 66.4 (*)    pO2, Ven 82.0 (*)    Bicarbonate 37.0 (*)  Acid-Base Excess 8.9 (*)    All other components within normal limits  CBG MONITORING, ED - Abnormal; Notable for the following components:   Glucose-Capillary 146 (*)    All other components within normal limits    EKG None  Radiology No results found.  Procedures Procedures   Medications Ordered in ED Medications  oseltamivir (TAMIFLU) capsule 75 mg (has no administration in time range)    ED Course  I have reviewed the triage vital signs and the nursing notes.  Pertinent labs & imaging results that were available during my care of the patient were reviewed by me and considered in my medical decision making (see chart for details).    MDM Rules/Calculators/A&P                           Patient presenting for evaluation of acute on chronic shortness of breath.  On exam, patient is mildly tachypneic, especially with any exertion including talking.  She does not appear fluid overloaded, no rales noted on exam.  However she has not been taking her medication, consider heart failure.  Also consider COPD exacerbation.  Consider infectious cause such as viral illness or pneumonia.  Labs, resp panel, x-ray ordered.  Labs interpreted by me, overall reassuring.  No leukocytosis.  Electrolytes stable.  Flu test is positive, this is likely the cause of patient's worsening  symptoms.  Patient unable to ambulate to obtain ambulatory sat.  However after discussion with RN, patient has been found to drop to the low 80s SpO2 (on her home O2). when coughing or moving in the bed. Pt has needed 4L of O2 in the ED to prevent desaturation. A I do not feel pt will do well at home with flu and her underlying medical conditions, will call for admission.   Discussed with Dr. Marylyn Ishihara from [facility] hospitalist service, patient to be admitted.  In the setting of patient's immobility and worsening shortness of breath today, request CTA be ordered to r/o PE.   Final Clinical Impression(s) / ED Diagnoses Final diagnoses:  Flu  SOB (shortness of breath)    Rx / DC Orders ED Discharge Orders     None        Franchot Heidelberg, PA-C 05/16/21 1255    Godfrey Pick, MD 05/17/21 1324

## 2021-05-16 NOTE — ED Notes (Signed)
Admitting MD paged, MD Tyrone notified and aware pt continues to self remove IV access.  IV fluids have not been started due to pt removing IV's.  Per MD Tyrone, ok for pt to go to floor w/out IV access.  Order in for IV team consult.

## 2021-05-16 NOTE — ED Notes (Signed)
Pt continues to self remove oxygen and calling out she's short of breath. ED RN put oxygen back on pt.

## 2021-05-16 NOTE — ED Notes (Signed)
Pt unable to tolerate ambulating pulse ox or standing pulse ox. Caccavale, Sophia, PA notified and aware of pt's inability to tolerate ambulating or standing pulse ox.  Pt too weak to stand for longer than several seconds.  Pt dyspnea on exertion or with any movement.  ED RN has visualized O2 sats mid to low 80%'s w/ 2L [place] on exertion, movement or w/ coughing.

## 2021-05-16 NOTE — ED Notes (Signed)
ED RN called 3rd floor charge nurse to get RN assigned so pt can be transported to assigned room.

## 2021-05-16 NOTE — ED Notes (Signed)
Pt agitated and calling out for nurse.  Pt states "i'm full, I'm full, I'm full". Pt states that she is voiding but states "I need some of this fluid off" pointing to her stomach.  Pt bladder scanned by ed tech.

## 2021-05-17 ENCOUNTER — Observation Stay (HOSPITAL_COMMUNITY): Payer: Medicaid Other

## 2021-05-17 ENCOUNTER — Inpatient Hospital Stay (HOSPITAL_COMMUNITY): Payer: Medicaid Other

## 2021-05-17 DIAGNOSIS — I10 Essential (primary) hypertension: Secondary | ICD-10-CM | POA: Diagnosis not present

## 2021-05-17 DIAGNOSIS — E119 Type 2 diabetes mellitus without complications: Secondary | ICD-10-CM | POA: Diagnosis present

## 2021-05-17 DIAGNOSIS — Z20822 Contact with and (suspected) exposure to covid-19: Secondary | ICD-10-CM | POA: Diagnosis present

## 2021-05-17 DIAGNOSIS — J111 Influenza due to unidentified influenza virus with other respiratory manifestations: Secondary | ICD-10-CM | POA: Diagnosis present

## 2021-05-17 DIAGNOSIS — G9341 Metabolic encephalopathy: Secondary | ICD-10-CM | POA: Diagnosis present

## 2021-05-17 DIAGNOSIS — J101 Influenza due to other identified influenza virus with other respiratory manifestations: Secondary | ICD-10-CM | POA: Diagnosis not present

## 2021-05-17 DIAGNOSIS — R339 Retention of urine, unspecified: Secondary | ICD-10-CM | POA: Diagnosis not present

## 2021-05-17 DIAGNOSIS — G4700 Insomnia, unspecified: Secondary | ICD-10-CM | POA: Diagnosis present

## 2021-05-17 DIAGNOSIS — I5033 Acute on chronic diastolic (congestive) heart failure: Secondary | ICD-10-CM | POA: Diagnosis not present

## 2021-05-17 DIAGNOSIS — I110 Hypertensive heart disease with heart failure: Secondary | ICD-10-CM | POA: Diagnosis present

## 2021-05-17 DIAGNOSIS — Z9049 Acquired absence of other specified parts of digestive tract: Secondary | ICD-10-CM | POA: Diagnosis not present

## 2021-05-17 DIAGNOSIS — M79604 Pain in right leg: Secondary | ICD-10-CM | POA: Diagnosis not present

## 2021-05-17 DIAGNOSIS — J45909 Unspecified asthma, uncomplicated: Secondary | ICD-10-CM | POA: Diagnosis present

## 2021-05-17 DIAGNOSIS — R0602 Shortness of breath: Secondary | ICD-10-CM | POA: Diagnosis not present

## 2021-05-17 DIAGNOSIS — Z9981 Dependence on supplemental oxygen: Secondary | ICD-10-CM | POA: Diagnosis not present

## 2021-05-17 DIAGNOSIS — D509 Iron deficiency anemia, unspecified: Secondary | ICD-10-CM | POA: Diagnosis present

## 2021-05-17 DIAGNOSIS — R7401 Elevation of levels of liver transaminase levels: Secondary | ICD-10-CM | POA: Diagnosis present

## 2021-05-17 DIAGNOSIS — F419 Anxiety disorder, unspecified: Secondary | ICD-10-CM | POA: Diagnosis present

## 2021-05-17 DIAGNOSIS — G4733 Obstructive sleep apnea (adult) (pediatric): Secondary | ICD-10-CM | POA: Diagnosis present

## 2021-05-17 DIAGNOSIS — N179 Acute kidney failure, unspecified: Secondary | ICD-10-CM | POA: Diagnosis present

## 2021-05-17 DIAGNOSIS — G8929 Other chronic pain: Secondary | ICD-10-CM | POA: Diagnosis present

## 2021-05-17 DIAGNOSIS — J1000 Influenza due to other identified influenza virus with unspecified type of pneumonia: Secondary | ICD-10-CM | POA: Diagnosis present

## 2021-05-17 DIAGNOSIS — J9621 Acute and chronic respiratory failure with hypoxia: Secondary | ICD-10-CM | POA: Diagnosis present

## 2021-05-17 DIAGNOSIS — M1711 Unilateral primary osteoarthritis, right knee: Secondary | ICD-10-CM | POA: Diagnosis present

## 2021-05-17 DIAGNOSIS — Z6841 Body Mass Index (BMI) 40.0 and over, adult: Secondary | ICD-10-CM | POA: Diagnosis not present

## 2021-05-17 DIAGNOSIS — E785 Hyperlipidemia, unspecified: Secondary | ICD-10-CM | POA: Diagnosis present

## 2021-05-17 DIAGNOSIS — I2721 Secondary pulmonary arterial hypertension: Secondary | ICD-10-CM | POA: Diagnosis present

## 2021-05-17 DIAGNOSIS — F32A Depression, unspecified: Secondary | ICD-10-CM | POA: Diagnosis present

## 2021-05-17 LAB — COMPREHENSIVE METABOLIC PANEL
ALT: 248 U/L — ABNORMAL HIGH (ref 0–44)
AST: 349 U/L — ABNORMAL HIGH (ref 15–41)
Albumin: 3.5 g/dL (ref 3.5–5.0)
Alkaline Phosphatase: 71 U/L (ref 38–126)
Anion gap: 10 (ref 5–15)
BUN: 24 mg/dL — ABNORMAL HIGH (ref 6–20)
CO2: 36 mmol/L — ABNORMAL HIGH (ref 22–32)
Calcium: 8.4 mg/dL — ABNORMAL LOW (ref 8.9–10.3)
Chloride: 94 mmol/L — ABNORMAL LOW (ref 98–111)
Creatinine, Ser: 0.77 mg/dL (ref 0.44–1.00)
GFR, Estimated: >60 mL/min (ref >60)
Glucose, Bld: 130 mg/dL — ABNORMAL HIGH (ref 70–99)
Potassium: 3.7 mmol/L (ref 3.5–5.1)
Sodium: 140 mmol/L (ref 135–145)
Total Bilirubin: 0.6 mg/dL (ref 0.3–1.2)
Total Protein: 8.5 g/dL — ABNORMAL HIGH (ref 6.5–8.1)

## 2021-05-17 LAB — FERRITIN: Ferritin: 500 ng/mL — ABNORMAL HIGH (ref 11–307)

## 2021-05-17 LAB — VITAMIN B12: Vitamin B-12: 309 pg/mL (ref 180–914)

## 2021-05-17 LAB — CBC
HCT: 50.3 % — ABNORMAL HIGH (ref 36.0–46.0)
Hemoglobin: 16.2 g/dL — ABNORMAL HIGH (ref 12.0–15.0)
MCH: 24.7 pg — ABNORMAL LOW (ref 26.0–34.0)
MCHC: 32.2 g/dL (ref 30.0–36.0)
MCV: 76.6 fL — ABNORMAL LOW (ref 80.0–100.0)
Platelets: 234 10*3/uL (ref 150–400)
RBC: 6.57 MIL/uL — ABNORMAL HIGH (ref 3.87–5.11)
RDW: 18 % — ABNORMAL HIGH (ref 11.5–15.5)
WBC: 9.8 10*3/uL (ref 4.0–10.5)
nRBC: 1.3 % — ABNORMAL HIGH (ref 0.0–0.2)

## 2021-05-17 LAB — GLUCOSE, CAPILLARY
Glucose-Capillary: 121 mg/dL — ABNORMAL HIGH (ref 70–99)
Glucose-Capillary: 167 mg/dL — ABNORMAL HIGH (ref 70–99)
Glucose-Capillary: 128 mg/dL — ABNORMAL HIGH (ref 70–99)
Glucose-Capillary: 134 mg/dL — ABNORMAL HIGH (ref 70–99)

## 2021-05-17 LAB — PROCALCITONIN: Procalcitonin: 0.55 ng/mL

## 2021-05-17 LAB — IRON AND TIBC
Iron: 49 ug/dL (ref 28–170)
Saturation Ratios: 15 % (ref 10.4–31.8)
TIBC: 327 ug/dL (ref 250–450)
UIBC: 278 ug/dL

## 2021-05-17 LAB — TSH: TSH: 1.72 u[IU]/mL (ref 0.350–4.500)

## 2021-05-17 MED ORDER — LORATADINE 10 MG PO TABS
10.0000 mg | ORAL_TABLET | Freq: Every day | ORAL | Status: DC
  Administered 2021-05-17 – 2021-05-24 (×8): 10 mg via ORAL
  Filled 2021-05-17 (×9): qty 1

## 2021-05-17 MED ORDER — INSULIN ASPART 100 UNIT/ML IJ SOLN: 0.0000 [IU] | Freq: Three times a day (TID) | INTRAMUSCULAR | Status: DC

## 2021-05-17 MED ORDER — MORPHINE SULFATE (PF) 2 MG/ML IV SOLN
2.0000 mg | INTRAVENOUS | Status: DC | PRN
  Administered 2021-05-17 – 2021-05-24 (×10): 2 mg via INTRAVENOUS
  Filled 2021-05-17 (×11): qty 1

## 2021-05-17 MED ORDER — IPRATROPIUM-ALBUTEROL 0.5-2.5 (3) MG/3ML IN SOLN
3.0000 mL | Freq: Four times a day (QID) | RESPIRATORY_TRACT | Status: DC
  Administered 2021-05-17: 3 mL via RESPIRATORY_TRACT

## 2021-05-17 MED ORDER — ACETAMINOPHEN 325 MG PO TABS
650.0000 mg | ORAL_TABLET | Freq: Four times a day (QID) | ORAL | Status: DC | PRN
  Administered 2021-05-21 – 2021-05-22 (×4): 650 mg via ORAL
  Filled 2021-05-17 (×3): qty 2

## 2021-05-17 MED ORDER — CYCLOBENZAPRINE HCL 5 MG PO TABS
5.0000 mg | ORAL_TABLET | Freq: Three times a day (TID) | ORAL | Status: DC | PRN
  Administered 2021-05-17 – 2021-05-24 (×4): 5 mg via ORAL
  Filled 2021-05-17 (×4): qty 1

## 2021-05-17 MED ORDER — CARVEDILOL 3.125 MG PO TABS
3.1250 mg | ORAL_TABLET | Freq: Two times a day (BID) | ORAL | Status: DC
  Administered 2021-05-17 – 2021-05-25 (×18): 3.125 mg via ORAL
  Filled 2021-05-17 (×18): qty 1

## 2021-05-17 MED ORDER — PROMETHAZINE HCL 25 MG PO TABS: 25.0000 mg | ORAL_TABLET | Freq: Three times a day (TID) | ORAL | Status: DC | PRN

## 2021-05-17 MED ORDER — GUAIFENESIN 100 MG/5ML PO LIQD
5.0000 mL | ORAL | Status: DC | PRN
  Administered 2021-05-18 – 2021-05-23 (×4): 5 mL via ORAL
  Filled 2021-05-17 (×4): qty 10

## 2021-05-17 MED ORDER — METOPROLOL TARTRATE 5 MG/5ML IV SOLN: 5.0000 mg | INTRAVENOUS | Status: DC | PRN

## 2021-05-17 MED ORDER — INSULIN ASPART 100 UNIT/ML IJ SOLN: 0.0000 [IU] | Freq: Every day | INTRAMUSCULAR | Status: DC

## 2021-05-17 MED ORDER — IPRATROPIUM-ALBUTEROL 0.5-2.5 (3) MG/3ML IN SOLN: 3.0000 mL | RESPIRATORY_TRACT | Status: DC | PRN

## 2021-05-17 MED ORDER — PANTOPRAZOLE SODIUM 40 MG PO TBEC
40.0000 mg | DELAYED_RELEASE_TABLET | Freq: Every day | ORAL | Status: DC
  Administered 2021-05-17 – 2021-05-25 (×9): 40 mg via ORAL
  Filled 2021-05-17 (×9): qty 1

## 2021-05-17 MED ORDER — INSULIN GLARGINE 100 UNIT/ML SOLOSTAR PEN: 20.0000 [IU] | PEN_INJECTOR | Freq: Every day | SUBCUTANEOUS | Status: DC

## 2021-05-17 MED ORDER — OXYCODONE HCL 5 MG PO TABS
10.0000 mg | ORAL_TABLET | ORAL | Status: DC | PRN
  Administered 2021-05-17 (×3): 10 mg via ORAL
  Filled 2021-05-17 (×3): qty 2

## 2021-05-17 MED ORDER — INSULIN GLARGINE-YFGN 100 UNIT/ML ~~LOC~~ SOLN
20.0000 [IU] | Freq: Every day | SUBCUTANEOUS | Status: DC
  Administered 2021-05-17 – 2021-05-19 (×3): 20 [IU] via SUBCUTANEOUS
  Filled 2021-05-17 (×3): qty 0.2

## 2021-05-17 MED ORDER — ISOSORB DINITRATE-HYDRALAZINE 20-37.5 MG PO TABS
1.0000 | ORAL_TABLET | Freq: Three times a day (TID) | ORAL | Status: DC
  Administered 2021-05-17 – 2021-05-25 (×26): 1 via ORAL
  Filled 2021-05-17 (×27): qty 1

## 2021-05-17 MED ORDER — TRAZODONE HCL 50 MG PO TABS
50.0000 mg | ORAL_TABLET | Freq: Every evening | ORAL | Status: DC | PRN
  Administered 2021-05-17: 50 mg via ORAL
  Filled 2021-05-17: qty 1

## 2021-05-17 MED ORDER — BENZONATATE 100 MG PO CAPS
200.0000 mg | ORAL_CAPSULE | Freq: Two times a day (BID) | ORAL | Status: DC | PRN
  Administered 2021-05-17: 200 mg via ORAL
  Filled 2021-05-17: qty 2

## 2021-05-17 MED ORDER — LINACLOTIDE 145 MCG PO CAPS
290.0000 ug | ORAL_CAPSULE | Freq: Every day | ORAL | Status: DC
  Administered 2021-05-17 – 2021-05-24 (×8): 290 ug via ORAL
  Filled 2021-05-17 (×9): qty 2

## 2021-05-17 MED ORDER — HYDRALAZINE HCL 20 MG/ML IJ SOLN: 10.0000 mg | INTRAMUSCULAR | Status: DC | PRN

## 2021-05-17 MED ORDER — IPRATROPIUM-ALBUTEROL 0.5-2.5 (3) MG/3ML IN SOLN
3.0000 mL | Freq: Three times a day (TID) | RESPIRATORY_TRACT | Status: DC
  Administered 2021-05-17 – 2021-05-21 (×13): 3 mL via RESPIRATORY_TRACT
  Filled 2021-05-17 (×12): qty 3

## 2021-05-17 MED ORDER — GABAPENTIN 400 MG PO CAPS
800.0000 mg | ORAL_CAPSULE | Freq: Three times a day (TID) | ORAL | Status: DC
  Administered 2021-05-17 (×3): 800 mg via ORAL
  Filled 2021-05-17 (×3): qty 2

## 2021-05-17 MED ORDER — SENNOSIDES-DOCUSATE SODIUM 8.6-50 MG PO TABS
1.0000 | ORAL_TABLET | Freq: Every evening | ORAL | Status: DC | PRN
  Administered 2021-05-20: 02:00:00 1 via ORAL
  Filled 2021-05-17: qty 1

## 2021-05-17 NOTE — Progress Notes (Signed)
PROGRESS NOTE    Cynthia Becker  DXI:[id number] DOB: 11-Sep-1970 DOA: 05/16/2021 PCP: [name], NP   Brief Narrative:  50 year old with history of HTN, PAH, diastolic CHF, DM2, chronic pain, OSA chronic pain, obesity started having cold type symptoms about 48 hours prior to admission.  She was advised to get an x-ray by her outpatient provider but she did not feel well enough to get it done.  She ended up coming to the hospital due to shortness of breath.  She was found to be flu a positive.  Hospital course also complicated by transaminitis.  Acute hepatitis panel was negative.  She was started on Tamiflu.   Assessment & Plan:   Principal Problem:   Flu  Acute respiratory distress secondary to influenza A pneumonia - Patient is still within timeframe of Tamiflu therefore it was initiated. - Bronchodilators scheduled and as needed, I-S/flutter - Supplemental oxygen as necessary - Check procalcitonin - Supportive care - Last echocardiogram April 2022-EF 65%.  No wall motion abnormality  Transaminitis - Slowly trending upwards.  Closely monitor.  Acute hepatitis panel negative - We will attempt to obtain right upper quadrant ultrasound  Microcytic anemia - No obvious evidence of blood loss - We will check iron studies, B12, folate  Right lower extremity pain in the calf and her knee area -Ordered right lower extremity ultrasound and x-ray.  Pain control, bowel regimen  DM2, insulin-dependent - A1c 6.9 - Sliding scale and Accu-Chek.  Lantus 20 units daily  Essential hypertension - Coreg, BiDil.  IV hydralazine and IV Lopressor as needed  Diastolic CHF, chronic.  EF 65% - Grossly does not appear to be in volume overload but will continue to monitor.  Home diuretics currently on hold.  Continue home Coreg and BiDil.  OSA Morbid obesity with BMI greater than 75 -We will order bedtime CPAP.  Will benefit from outpatient referral to bariatric  [facility] - Not on any home  meds.  This is likely combination of multiple issues including morbid obesity    DVT prophylaxis: Lovenox Code Status: Full  Family Communication:   Inpatient, maintain hospital stay due to severe pain in the right lower extremity, abnormal LFTs and respiratory distress  Subjective: Patient states she has some respiratory distress especially with minimal ambulation but at the moment she is having significant amount of right knee and right lower extremity pain causing inability to move much.  Review of Systems Otherwise negative except as per HPI, including: General: Denies fever, chills, night sweats or unintended weight loss. Resp: Denies cough, wheezing, shortness of breath. Cardiac: Denies chest pain, palpitations, orthopnea, paroxysmal nocturnal dyspnea. GI: Denies abdominal pain, nausea, vomiting, diarrhea or constipation GU: Denies dysuria, frequency, hesitancy or incontinence MS: Denies muscle aches, joint pain or swelling Neuro: Denies headache, neurologic deficits (focal weakness, numbness, tingling), abnormal gait Psych: Denies anxiety, depression, SI/HI/AVH Skin: Denies new rashes or lesions ID: Denies sick contacts, exotic exposures, travel  Examination:  General exam: Appears calm and comfortable, morbid obesity Respiratory system: Clear to auscultation. Respiratory effort normal. Cardiovascular system: S1 & S2 heard, RRR. No JVD, murmurs, rubs, gallops or clicks. No pedal edema. Gastrointestinal system: Abdomen is nondistended, soft and nontender. No organomegaly or masses felt. Normal bowel sounds heard. Central nervous system: Alert and oriented. No focal neurological deficits. Extremities: Symmetric 5 x 5 power.  Right calf pain, limited range of motion of the right knee, difficulty flexing her knee and lifting her legs. Skin: No rashes, lesions or ulcers Psychiatry:  Judgement and insight appear normal. Mood & affect appropriate.     Objective: Vitals:    05/16/21 2023 05/16/21 2108 05/17/21 0149 05/17/21 0553  BP: 138/79 (!) 147/92 133/88 (!) 148/95  Pulse: 86 96 (!) 110 87  Resp: 20 18    Temp:  99.5 F (37.5 C)  97.6 F (36.4 C)  TempSrc:  Oral  Oral  SpO2: 99% 90% 92% 98%  Weight:      Height:        Intake/Output Summary (Last 24 hours) at 05/17/2021 0755 Last data filed at 05/17/2021 0600 Gross per 24 hour  Intake 240 ml  Output 470 ml  Net -230 ml   Filed Weights   05/16/21 1019  Weight: (!) 212 kg     Data Reviewed:   CBC: Recent Labs  Lab 05/14/21 1246 05/16/21 1100 05/16/21 1610 05/17/21 0434  WBC 7.9 8.8 8.6 9.8  NEUTROABS 5.9 6.7  --   --   HGB 15.8 15.4* 15.7* 16.2*  HCT 49.0* 47.8* 48.9* 50.3*  MCV 77* 77.2* 78.4* 76.6*  PLT 233 270 246 016   Basic Metabolic Panel: Recent Labs  Lab 05/14/21 1246 05/16/21 1100 05/16/21 1610 05/17/21 0434  NA 143 139  --  140  K 4.6 5.0  --  3.7  CL 96 93*  --  94*  CO2 29 35*  --  36*  GLUCOSE 111* 153*  --  130*  BUN 15 21*  --  24*  CREATININE 1.01* 1.00 1.03* 0.77  CALCIUM 9.2 8.4*  --  8.4*   GFR: Estimated Creatinine Clearance: 156.3 mL/min (by C-G formula based on SCr of 0.77 mg/dL). Liver Function Tests: Recent Labs  Lab 05/14/21 1246 05/16/21 1100 05/17/21 0434  AST 21 146* 349*  ALT 17 80* 248*  ALKPHOS 94 75 71  BILITOT 0.3 1.0 0.6  PROT 7.7 8.7* 8.5*  ALBUMIN 3.8 3.6 3.5   No results for input(s): LIPASE, AMYLASE in the last 168 hours. No results for input(s): AMMONIA in the last 168 hours. Coagulation Profile: No results for input(s): INR, PROTIME in the last 168 hours. Cardiac Enzymes: No results for input(s): CKTOTAL, CKMB, CKMBINDEX, TROPONINI in the last 168 hours. BNP (last 3 results) No results for input(s): PROBNP in the last 8760 hours. HbA1C: Recent Labs    05/16/21 1610  HGBA1C 6.9*   CBG: Recent Labs  Lab 05/16/21 1057 05/16/21 1629 05/16/21 2151  GLUCAP 146* 127* 131*   Lipid Profile: Recent Labs     05/14/21 1246  CHOL 200*  HDL 41  LDLCALC 141*  TRIG 99  CHOLHDL 4.9*   Thyroid Function Tests: No results for input(s): TSH, T4TOTAL, FREET4, T3FREE, THYROIDAB in the last 72 hours. Anemia Panel: No results for input(s): VITAMINB12, FOLATE, FERRITIN, TIBC, IRON, RETICCTPCT in the last 72 hours. Sepsis Labs: No results for input(s): PROCALCITON, LATICACIDVEN in the last 168 hours.  Recent Results (from the past 240 hour(s))  Resp Panel by RT-PCR (Flu A&B, Covid) Nasopharyngeal Swab     Status: Abnormal   Collection Time: 05/16/21 10:38 AM   Specimen: Nasopharyngeal Swab; Nasopharyngeal(NP) swabs in vial transport medium  Result Value Ref Range Status   SARS Coronavirus 2 by RT PCR NEGATIVE NEGATIVE Final    Comment: (NOTE) SARS-CoV-2 target nucleic acids are NOT DETECTED.  The SARS-CoV-2 RNA is generally detectable in upper respiratory specimens during the acute phase of infection. The lowest concentration of SARS-CoV-2 viral copies this assay can detect is 138  copies/mL. A negative result does not preclude SARS-Cov-2 infection and should not be used as the sole basis for treatment or other patient management decisions. A negative result may occur with  improper specimen collection/handling, submission of specimen other than nasopharyngeal swab, presence of viral mutation(s) within the areas targeted by this assay, and inadequate number of viral copies(<138 copies/mL). A negative result must be combined with clinical observations, patient history, and epidemiological information. The expected result is Negative.  Fact Sheet for Patients:  EntrepreneurPulse.com.au  Fact Sheet for Healthcare Providers:  IncredibleEmployment.be  This test is no t yet approved or cleared by the [place] FDA and  has been authorized for detection and/or diagnosis of SARS-CoV-2 by FDA under an Emergency Use Authorization (EUA). This EUA will remain  in  effect (meaning this test can be used) for the duration of the COVID-19 declaration under Section 564(b)(1) of the Act, 21 U.S.C.section 360bbb-3(b)(1), unless the authorization is terminated  or revoked sooner.       Influenza A by PCR POSITIVE (A) NEGATIVE Final   Influenza B by PCR NEGATIVE NEGATIVE Final    Comment: (NOTE) The Xpert Xpress SARS-CoV-2/FLU/RSV plus assay is intended as an aid in the diagnosis of influenza from Nasopharyngeal swab specimens and should not be used as a sole basis for treatment. Nasal washings and aspirates are unacceptable for Xpert Xpress SARS-CoV-2/FLU/RSV testing.  Fact Sheet for Patients: EntrepreneurPulse.com.au  Fact Sheet for Healthcare Providers: IncredibleEmployment.be  This test is not yet approved or cleared by the [place] FDA and has been authorized for detection and/or diagnosis of SARS-CoV-2 by FDA under an Emergency Use Authorization (EUA). This EUA will remain in effect (meaning this test can be used) for the duration of the COVID-19 declaration under Section 564(b)(1) of the Act, 21 U.S.C. section 360bbb-3(b)(1), unless the authorization is terminated or revoked.  Performed at [facility], [place] [address], [place], [place] [address]          Radiology Studies: CT Angio Chest PE W and/or Wo Contrast  Result Date: 05/16/2021 CLINICAL DATA:  Short of breath.  Rule out pulmonary embolism. EXAM: CT ANGIOGRAPHY CHEST WITH CONTRAST TECHNIQUE: Multidetector CT imaging of the chest was performed using the standard protocol during bolus administration of intravenous contrast. Multiplanar CT image reconstructions and MIPs were obtained to evaluate the vascular anatomy. CONTRAST:  186mL OMNIPAQUE IOHEXOL 350 MG/ML SOLN COMPARISON:  CT angio chest for 04/2021 FINDINGS: Cardiovascular: Negative for pulmonary embolism. Pulmonary artery enlargement. Main pulmonary artery 3.6 cm in  diameter normal thoracic aorta. Heart mildly enlarged. No pericardial effusion. Mediastinum/Nodes: Scattered small mediastinal and hilar lymph nodes bilaterally, stable. Lungs/Pleura: Lungs are well aerated and clear. No infiltrate effusion or nodule. Upper Abdomen: No acute abnormality.  Cholecystectomy clips. Musculoskeletal: No acute abnormality. Review of the MIP images confirms the above findings. IMPRESSION: 1. Negative for pulmonary embolus. 2. Pulmonary artery enlargement unchanged 3. No acute abnormality in the chest. Electronically Signed   By: Franchot Gallo M.D.   On: 05/16/2021 13:30        Scheduled Meds:  docusate sodium  100 mg Oral BID   enoxaparin (LOVENOX) injection  40 mg Subcutaneous Q12H   guaiFENesin  600 mg Oral BID   insulin aspart  0-15 Units Subcutaneous TID WC   insulin aspart  0-5 Units Subcutaneous QHS   ipratropium-albuterol  3 mL Nebulization Q6H   oseltamivir  75 mg Oral BID   Continuous Infusions:   LOS: 0 days  Time spent= 35 mins    Densel Kronick Arsenio Loader, MD [facility]  If 7PM-7AM, please contact night-coverage  05/17/2021, 7:55 AM

## 2021-05-17 NOTE — Progress Notes (Signed)
RLE venous duplex has been completed.  Results can be found under chart review under CV PROC. 05/17/2021 4:27 PM Jsiah Menta RVT, RDMS

## 2021-05-17 NOTE — Progress Notes (Signed)
PT Cancellation Note  Patient Details Name: Cynthia Becker MRN: [id number] DOB: 08/30/1970   Cancelled Treatment:    Reason Eval/Treat Not Completed: Medical issues which prohibited therapy (xray of R knee pending. Will hold and follow.)  Philomena Doheny PT 05/17/2021  Acute Rehabilitation Services Pager [phone] Office [phone]

## 2021-05-17 NOTE — Progress Notes (Signed)
Pt continues to remove oxygen. When replaced she becomes argumentative and resistant.

## 2021-05-18 ENCOUNTER — Inpatient Hospital Stay (HOSPITAL_COMMUNITY): Payer: Medicaid Other

## 2021-05-18 DIAGNOSIS — J111 Influenza due to unidentified influenza virus with other respiratory manifestations: Secondary | ICD-10-CM | POA: Diagnosis not present

## 2021-05-18 LAB — CBC
HCT: 55.5 % — ABNORMAL HIGH (ref 36.0–46.0)
Hemoglobin: 17.3 g/dL — ABNORMAL HIGH (ref 12.0–15.0)
MCH: 25.1 pg — ABNORMAL LOW (ref 26.0–34.0)
MCHC: 31.2 g/dL (ref 30.0–36.0)
MCV: 80.7 fL (ref 80.0–100.0)
Platelets: DECREASED 10*3/uL (ref 150–400)
RBC: 6.88 MIL/uL — ABNORMAL HIGH (ref 3.87–5.11)
RDW: 18.6 % — ABNORMAL HIGH (ref 11.5–15.5)
WBC: 9.8 10*3/uL (ref 4.0–10.5)
nRBC: 1.8 % — ABNORMAL HIGH (ref 0.0–0.2)

## 2021-05-18 LAB — GLUCOSE, CAPILLARY
Glucose-Capillary: 201 mg/dL — ABNORMAL HIGH (ref 70–99)
Glucose-Capillary: 152 mg/dL — ABNORMAL HIGH (ref 70–99)
Glucose-Capillary: 113 mg/dL — ABNORMAL HIGH (ref 70–99)
Glucose-Capillary: 110 mg/dL — ABNORMAL HIGH (ref 70–99)
Glucose-Capillary: 229 mg/dL — ABNORMAL HIGH (ref 70–99)
Glucose-Capillary: 214 mg/dL — ABNORMAL HIGH (ref 70–99)

## 2021-05-18 LAB — COMPREHENSIVE METABOLIC PANEL
ALT: 355 U/L — ABNORMAL HIGH (ref 0–44)
AST: 314 U/L — ABNORMAL HIGH (ref 15–41)
Albumin: 3 g/dL — ABNORMAL LOW (ref 3.5–5.0)
Alkaline Phosphatase: 62 U/L (ref 38–126)
Anion gap: 9 (ref 5–15)
BUN: 21 mg/dL — ABNORMAL HIGH (ref 6–20)
CO2: 35 mmol/L — ABNORMAL HIGH (ref 22–32)
Calcium: 8.4 mg/dL — ABNORMAL LOW (ref 8.9–10.3)
Chloride: 96 mmol/L — ABNORMAL LOW (ref 98–111)
Creatinine, Ser: 1.01 mg/dL — ABNORMAL HIGH (ref 0.44–1.00)
GFR, Estimated: >60 mL/min (ref >60)
Glucose, Bld: 154 mg/dL — ABNORMAL HIGH (ref 70–99)
Potassium: 3.7 mmol/L (ref 3.5–5.1)
Sodium: 140 mmol/L (ref 135–145)
Total Bilirubin: 0.8 mg/dL (ref 0.3–1.2)
Total Protein: 7.6 g/dL (ref 6.5–8.1)

## 2021-05-18 LAB — BRAIN NATRIURETIC PEPTIDE: B Natriuretic Peptide: 19.2 pg/mL (ref 0.0–100.0)

## 2021-05-18 LAB — BLOOD GAS, ARTERIAL
Acid-Base Excess: 6 mmol/L — ABNORMAL HIGH (ref 0.0–2.0)
Bicarbonate: 34.4 mmol/L — ABNORMAL HIGH (ref 20.0–28.0)
O2 Content: 12 L/min
O2 Saturation: 98 %
Patient temperature: 99
pCO2 arterial: 67.1 mmHg (ref 32.0–48.0)
pH, Arterial: 7.331 — ABNORMAL LOW (ref 7.350–7.450)
pO2, Arterial: 144 mmHg — ABNORMAL HIGH (ref 83.0–108.0)

## 2021-05-18 LAB — FOLATE RBC
Folate, Hemolysate: 198 ng/mL
Folate, RBC: 405 ng/mL — ABNORMAL LOW (ref >498)
Hematocrit: 48.9 % — ABNORMAL HIGH (ref 34.0–46.6)

## 2021-05-18 LAB — URIC ACID: Uric Acid, Serum: 12.3 mg/dL — ABNORMAL HIGH (ref 2.5–7.1)

## 2021-05-18 LAB — MRSA NEXT GEN BY PCR, NASAL: MRSA by PCR Next Gen: NOT DETECTED

## 2021-05-18 MED ORDER — CHLORHEXIDINE GLUCONATE 0.12 % MT SOLN
15.0000 mL | Freq: Two times a day (BID) | OROMUCOSAL | Status: DC
  Administered 2021-05-18 – 2021-05-25 (×13): 15 mL via OROMUCOSAL
  Filled 2021-05-18 (×13): qty 15

## 2021-05-18 MED ORDER — FUROSEMIDE 10 MG/ML IJ SOLN
40.0000 mg | Freq: Once | INTRAMUSCULAR | Status: AC
  Administered 2021-05-18: 40 mg via INTRAVENOUS
  Filled 2021-05-18: qty 4

## 2021-05-18 MED ORDER — NALOXONE HCL 0.4 MG/ML IJ SOLN
0.4000 mg | INTRAMUSCULAR | Status: DC | PRN
  Administered 2021-05-18: 0.4 mg via INTRAVENOUS
  Filled 2021-05-18: qty 1

## 2021-05-18 MED ORDER — GABAPENTIN 400 MG PO CAPS
800.0000 mg | ORAL_CAPSULE | Freq: Two times a day (BID) | ORAL | Status: DC
  Administered 2021-05-18 – 2021-05-25 (×15): 800 mg via ORAL
  Filled 2021-05-18 (×15): qty 2

## 2021-05-18 MED ORDER — CHLORHEXIDINE GLUCONATE CLOTH 2 % EX PADS
6.0000 | MEDICATED_PAD | Freq: Every day | CUTANEOUS | Status: DC
  Administered 2021-05-18 – 2021-05-20 (×3): 6 via TOPICAL

## 2021-05-18 MED ORDER — ORAL CARE MOUTH RINSE
15.0000 mL | Freq: Two times a day (BID) | OROMUCOSAL | Status: DC
  Administered 2021-05-18 – 2021-05-25 (×9): 15 mL via OROMUCOSAL

## 2021-05-18 MED ORDER — PREDNISONE 20 MG PO TABS
20.0000 mg | ORAL_TABLET | Freq: Every day | ORAL | Status: AC
  Administered 2021-05-18 – 2021-05-22 (×5): 20 mg via ORAL
  Filled 2021-05-18 (×5): qty 1

## 2021-05-18 MED ORDER — ADULT MULTIVITAMIN W/MINERALS CH
1.0000 | ORAL_TABLET | Freq: Every day | ORAL | Status: DC
Start: 2021-05-18 — End: 2021-05-25
  Administered 2021-05-18 – 2021-05-25 (×8): 1 via ORAL
  Filled 2021-05-18 (×8): qty 1

## 2021-05-18 MED ORDER — GLUCERNA SHAKE PO LIQD
237.0000 mL | Freq: Two times a day (BID) | ORAL | Status: DC
  Administered 2021-05-18 – 2021-05-25 (×13): 237 mL via ORAL
  Filled 2021-05-18 (×15): qty 237

## 2021-05-18 MED ORDER — PROSOURCE PLUS PO LIQD
30.0000 mL | Freq: Two times a day (BID) | ORAL | Status: DC
  Administered 2021-05-18 – 2021-05-25 (×10): 30 mL via ORAL
  Filled 2021-05-18 (×12): qty 30

## 2021-05-18 NOTE — Progress Notes (Signed)
    OVERNIGHT PROGRESS REPORT  Notified by rapid response RN for rapid response in patient's room requested by patient's assigned nurse.  Phlebotomy and RN had gone into the patients room for lab collection and found the patient respiratory status of concern.  Patient was minimally responsive but did retain a pulse.  Patient was breathing on her own and additional oxygen was administered by Rapid Response. Rapid response removed a large amount of emesis from patients mouth at which time spontaneous respirations improved.  Patient would fall back to sleep quickly but could be awakened. Patient was given  Narcan due to earlier narcotic administration. Patient did not have obvious response to Narcan.  Quickly during response, patient was awake and oriented to place and self improving cognitively.  ABG, chest x-ray, lab work was collected. EKG was sinus rhythm rate in the 80s Blood pressure was maintained. BVM was not used. CPR was not started. CBG was 201.  Transferred to SDU and is fully awake and oriented. CXR and Labs pending.  Patient is awake and Oriented at this time (0547Hrs)   Gershon Cull MSNA MSN [place]

## 2021-05-18 NOTE — Progress Notes (Signed)
PROGRESS NOTE    Cynthia Becker  VPX:[id number] DOB: Feb 05, 1971 DOA: 05/16/2021 PCP: [name], NP   Brief Narrative:  50 year old with history of HTN, PAH, diastolic CHF, DM2, chronic pain, OSA chronic pain, obesity started having cold type symptoms about 48 hours prior to admission.  She was advised to get an x-ray by her outpatient provider but she did not feel well enough to get it done.  She ended up coming to the hospital due to shortness of breath.  She was found to be flu a positive.  Hospital course also complicated by transaminitis.  Acute hepatitis panel was negative.  She was started on Tamiflu.   Assessment & Plan:   Principal Problem:   Flu Active Problems:   Influenza A  Acute respiratory distress secondary to influenza A pneumonia -Now increasing oxygen requirement at 6 L nasal cannula. - Patient is still within timeframe of Tamiflu therefore it was initiated. - Bronchodilators scheduled and as needed, I-S/flutter - Supplemental oxygen as necessary, supplemental oxygen - BNP 19, Pro-Cal 0.5 - Last echocardiogram April 2022-EF 65%.  No wall motion abnormality  Encephalopathy/somnolent - Suspect secondary to CO2 narcosis versus narcotic use.  She needs to use BiPAP as needed.  Due to her morbid obesity, BNP can be falsely low therefore will give a dose of Lasix 40 mg IV.  Reduce dose of oxycodone IR to 5 mg at bedtime.  Discontinue bedtime trazodone, reduce gabapentin to 800 mg twice daily  Transaminitis - Suspect this could be related to hepatic congestion - RUQ ultrasound showed hepatic steatosis.  Acute hepatitis panel negative  Microcytic anemia - No obvious signs of blood loss, iron studies, TSH, B12 overall unremarkable.  Right lower extremity pain in the calf and her knee area - Knee x-ray shows degenerative disease. Right lower extremity Dopplers-prelim no DVT.  Due to persistent pain, will order CT of the right knee  DM2, insulin-dependent - A1c  6.9 - Sliding scale and Accu-Chek.  Lantus 20 units daily  Essential hypertension - Coreg, BiDil.  IV hydralazine and IV Lopressor as needed  Diastolic CHF, chronic.  EF 65% - Grossly does not appear to be in volume overload but will continue to monitor.  Home diuretics currently on hold.  Continue home Coreg and BiDil.  OSA Morbid obesity with BMI greater than 75 -Currently on off and on BiPAP but needs to continue using CPAP after this at qhs.   PAH - Not on any home meds.  This is likely combination of multiple issues including morbid obesity  Acute urinary retention - Straight in and out cath, if unsuccessful or persistent she will require Foley catheter    DVT prophylaxis: Lovenox Code Status: Full  Family Communication: Husband at bedside Inpatient, maintain hospital stay due to severe pain in the right lower extremity, abnormal LFTs and respiratory distress  Subjective: Still having significant amount of right knee pain.  She also states she is having trouble urinating.  Husband is present at bedside, bladder scan shows fluid greater than 500 cc.  Review of Systems Otherwise negative except as per HPI, including: General: Denies fever, chills, night sweats or unintended weight loss. Resp: Denies cough, wheezing, shortness of breath. Cardiac: Denies chest pain, palpitations, orthopnea, paroxysmal nocturnal dyspnea. GI: Denies abdominal pain, nausea, vomiting, diarrhea or constipation GU: Denies dysuria, frequency, hesitancy or incontinence MS: Right knee pain Neuro: Denies headache, neurologic deficits (focal weakness, numbness, tingling), abnormal gait Psych: Denies anxiety, depression, SI/HI/AVH Skin: Denies new rashes or  lesions ID: Denies sick contacts, exotic exposures, travel  Examination: Constitutional: Not in acute distress, morbid obesity Respiratory: Clear to auscultation bilaterally Cardiovascular: Normal sinus rhythm, no rubs Abdomen: Nontender  nondistended good bowel sounds Musculoskeletal: Right knee pain, unable to bend.  Tender to touch. Skin: No rashes seen Neurologic: CN 2-12 grossly intact.  And nonfocal Psychiatric: Normal judgment and insight. Alert and oriented x 3. Normal mood.   Objective: Vitals:   05/18/21 0500 05/18/21 0600 05/18/21 0700 05/18/21 0722  BP: 123/64 (!) 151/87    Pulse: 87 89 80   Resp: 16 (!) 23 20   Temp:      TempSrc:      SpO2: 94% 100% 91% 93%  Weight:      Height:        Intake/Output Summary (Last 24 hours) at 05/18/2021 0745 Last data filed at 05/18/2021 0200 Gross per 24 hour  Intake 720 ml  Output 250 ml  Net 470 ml   Filed Weights   05/16/21 1019  Weight: (!) 212 kg     Data Reviewed:   CBC: Recent Labs  Lab 05/14/21 1246 05/16/21 1100 05/16/21 1610 05/17/21 0434 05/18/21 0425  WBC 7.9 8.8 8.6 9.8 9.8  NEUTROABS 5.9 6.7  --   --   --   HGB 15.8 15.4* 15.7* 16.2* 17.3*  HCT 49.0* 47.8* 48.9* 50.3* 55.5*  MCV 77* 77.2* 78.4* 76.6* 80.7  PLT 233 270 246 234 PLATELET CLUMPS NOTED ON SMEAR, COUNT APPEARS DECREASED   Basic Metabolic Panel: Recent Labs  Lab 05/14/21 1246 05/16/21 1100 05/16/21 1610 05/17/21 0434 05/18/21 0549  NA 143 139  --  140 140  K 4.6 5.0  --  3.7 3.7  CL 96 93*  --  94* 96*  CO2 29 35*  --  36* 35*  GLUCOSE 111* 153*  --  130* 154*  BUN 15 21*  --  24* 21*  CREATININE 1.01* 1.00 1.03* 0.77 1.01*  CALCIUM 9.2 8.4*  --  8.4* 8.4*   GFR: Estimated Creatinine Clearance: 123.8 mL/min (A) (by C-G formula based on SCr of 1.01 mg/dL (H)). Liver Function Tests: Recent Labs  Lab 05/14/21 1246 05/16/21 1100 05/17/21 0434 05/18/21 0549  AST 21 146* 349* 314*  ALT 17 80* 248* 355*  ALKPHOS 94 75 71 62  BILITOT 0.3 1.0 0.6 0.8  PROT 7.7 8.7* 8.5* 7.6  ALBUMIN 3.8 3.6 3.5 3.0*   No results for input(s): LIPASE, AMYLASE in the last 168 hours. No results for input(s): AMMONIA in the last 168 hours. Coagulation Profile: No results  for input(s): INR, PROTIME in the last 168 hours. Cardiac Enzymes: No results for input(s): CKTOTAL, CKMB, CKMBINDEX, TROPONINI in the last 168 hours. BNP (last 3 results) No results for input(s): PROBNP in the last 8760 hours. HbA1C: Recent Labs    05/16/21 1610  HGBA1C 6.9*   CBG: Recent Labs  Lab 05/17/21 1137 05/17/21 1616 05/17/21 2233 05/18/21 0402 05/18/21 0737  GLUCAP 167* 128* 134* 201* 152*   Lipid Profile: No results for input(s): CHOL, HDL, LDLCALC, TRIG, CHOLHDL, LDLDIRECT in the last 72 hours.  Thyroid Function Tests: Recent Labs    05/17/21 0819  TSH 1.720   Anemia Panel: Recent Labs    05/17/21 0819  VITAMINB12 309  FERRITIN 500*  TIBC 327  IRON 49   Sepsis Labs: Recent Labs  Lab 05/17/21 0819  PROCALCITON 0.55    Recent Results (from the past 240 hour(s))  Resp Panel  by RT-PCR (Flu A&B, Covid) Nasopharyngeal Swab     Status: Abnormal   Collection Time: 05/16/21 10:38 AM   Specimen: Nasopharyngeal Swab; Nasopharyngeal(NP) swabs in vial transport medium  Result Value Ref Range Status   SARS Coronavirus 2 by RT PCR NEGATIVE NEGATIVE Final    Comment: (NOTE) SARS-CoV-2 target nucleic acids are NOT DETECTED.  The SARS-CoV-2 RNA is generally detectable in upper respiratory specimens during the acute phase of infection. The lowest concentration of SARS-CoV-2 viral copies this assay can detect is 138 copies/mL. A negative result does not preclude SARS-Cov-2 infection and should not be used as the sole basis for treatment or other patient management decisions. A negative result may occur with  improper specimen collection/handling, submission of specimen other than nasopharyngeal swab, presence of viral mutation(s) within the areas targeted by this assay, and inadequate number of viral copies(<138 copies/mL). A negative result must be combined with clinical observations, patient history, and epidemiological information. The expected result is  Negative.  Fact Sheet for Patients:  EntrepreneurPulse.com.au  Fact Sheet for Healthcare Providers:  IncredibleEmployment.be  This test is no t yet approved or cleared by the [place] FDA and  has been authorized for detection and/or diagnosis of SARS-CoV-2 by FDA under an Emergency Use Authorization (EUA). This EUA will remain  in effect (meaning this test can be used) for the duration of the COVID-19 declaration under Section 564(b)(1) of the Act, 21 U.S.C.section 360bbb-3(b)(1), unless the authorization is terminated  or revoked sooner.       Influenza A by PCR POSITIVE (A) NEGATIVE Final   Influenza B by PCR NEGATIVE NEGATIVE Final    Comment: (NOTE) The Xpert Xpress SARS-CoV-2/FLU/RSV plus assay is intended as an aid in the diagnosis of influenza from Nasopharyngeal swab specimens and should not be used as a sole basis for treatment. Nasal washings and aspirates are unacceptable for Xpert Xpress SARS-CoV-2/FLU/RSV testing.  Fact Sheet for Patients: EntrepreneurPulse.com.au  Fact Sheet for Healthcare Providers: IncredibleEmployment.be  This test is not yet approved or cleared by the [place] FDA and has been authorized for detection and/or diagnosis of SARS-CoV-2 by FDA under an Emergency Use Authorization (EUA). This EUA will remain in effect (meaning this test can be used) for the duration of the COVID-19 declaration under Section 564(b)(1) of the Act, 21 U.S.C. section 360bbb-3(b)(1), unless the authorization is terminated or revoked.  Performed at [facility], [place] [address], [place], [place] [address]   MRSA Next Gen by PCR, Nasal     Status: None   Collection Time: 05/18/21  4:55 AM   Specimen: Nasal Mucosa; Nasal Swab  Result Value Ref Range Status   MRSA by PCR Next Gen NOT DETECTED NOT DETECTED Final    Comment: (NOTE) The GeneXpert MRSA Assay (FDA  approved for NASAL specimens only), is one component of a comprehensive MRSA colonization surveillance program. It is not intended to diagnose MRSA infection nor to guide or monitor treatment for MRSA infections. Test performance is not FDA approved in patients less than 6 years old. Performed at [facility], [place] [address], [place], [place] [address]          Radiology Studies: DG Knee 1-2 Views Right  Result Date: 05/17/2021 CLINICAL DATA:  Knee pain EXAM: RIGHT KNEE - 1-2 VIEW COMPARISON:  Right knee x-ray 06/13/2020 FINDINGS: Somewhat limited due to body habitus and portable technique. No acute fracture identified. Mild-to-moderate narrowing of the patellofemoral joint and medial compartment with marginal  osteophyte formation. IMPRESSION: Degenerative changes with no definite fracture identified. Electronically Signed   By: Ofilia Neas M.D.   On: 05/17/2021 13:18   CT Angio Chest PE W and/or Wo Contrast  Result Date: 05/16/2021 CLINICAL DATA:  Short of breath.  Rule out pulmonary embolism. EXAM: CT ANGIOGRAPHY CHEST WITH CONTRAST TECHNIQUE: Multidetector CT imaging of the chest was performed using the standard protocol during bolus administration of intravenous contrast. Multiplanar CT image reconstructions and MIPs were obtained to evaluate the vascular anatomy. CONTRAST:  167mL OMNIPAQUE IOHEXOL 350 MG/ML SOLN COMPARISON:  CT angio chest for 04/2021 FINDINGS: Cardiovascular: Negative for pulmonary embolism. Pulmonary artery enlargement. Main pulmonary artery 3.6 cm in diameter normal thoracic aorta. Heart mildly enlarged. No pericardial effusion. Mediastinum/Nodes: Scattered small mediastinal and hilar lymph nodes bilaterally, stable. Lungs/Pleura: Lungs are well aerated and clear. No infiltrate effusion or nodule. Upper Abdomen: No acute abnormality.  Cholecystectomy clips. Musculoskeletal: No acute abnormality. Review of the MIP images confirms the above  findings. IMPRESSION: 1. Negative for pulmonary embolus. 2. Pulmonary artery enlargement unchanged 3. No acute abnormality in the chest. Electronically Signed   By: Franchot Gallo M.D.   On: 05/16/2021 13:30   DG CHEST PORT 1 VIEW  Result Date: 05/18/2021 CLINICAL DATA:  Aspiration, increasing shortness of breath. EXAM: PORTABLE CHEST 1 VIEW COMPARISON:  04/17/2021. FINDINGS: The heart is borderline enlarged the mediastinal structures are within normal limits. The pulmonary vasculature is prominent. Lung volumes are low. No consolidation, effusion, or pneumothorax. No acute osseous abnormality. IMPRESSION: Borderline cardiomegaly with distended pulmonary vasculature. Electronically Signed   By: Brett Fairy M.D.   On: 05/18/2021 04:40   VAS [place] LOWER EXTREMITY VENOUS (DVT)  Result Date: 05/17/2021  Lower Venous DVT Study Patient Name:  Cynthia Becker  Date of Exam:   05/17/2021 Medical Rec #: [id number]      Accession #:    [id number] Date of Birth: [birth year]     Patient Gender: F Patient Age:   85 years Exam Location:  [facility] Procedure:      VAS [place] LOWER EXTREMITY VENOUS (DVT) Referring Phys: Gerlean Ren --------------------------------------------------------------------------------  Indications: Pain.  Limitations: Body habitus. Comparison Study: No previous exams Performing Technologist: Jody Hill RVT, RDMS  Examination Guidelines: A complete evaluation includes B-mode imaging, spectral Doppler, color Doppler, and power Doppler as needed of all accessible portions of each vessel. Bilateral testing is considered an integral part of a complete examination. Limited examinations for reoccurring indications may be performed as noted. The reflux portion of the exam is performed with the patient in reverse Trendelenburg.  +---------+---------------+---------+-----------+----------+-------------------+ RIGHT    CompressibilityPhasicitySpontaneityPropertiesThrombus Aging       +---------+---------------+---------+-----------+----------+-------------------+ CFV      Full                                                             +---------+---------------+---------+-----------+----------+-------------------+ SFJ      Full                                                             +---------+---------------+---------+-----------+----------+-------------------+ FV Prox  Full  Yes      Yes                                      +---------+---------------+---------+-----------+----------+-------------------+ FV Mid   Full           Yes      Yes                                      +---------+---------------+---------+-----------+----------+-------------------+ FV DistalFull           Yes      Yes                                      +---------+---------------+---------+-----------+----------+-------------------+ PFV      Full                                                             +---------+---------------+---------+-----------+----------+-------------------+ POP                     Yes      Yes                  patent by color and                                                       doppler.            +---------+---------------+---------+-----------+----------+-------------------+ PTV      Full                                                             +---------+---------------+---------+-----------+----------+-------------------+ PERO                                                  Not visualized on                                                         this exam           +---------+---------------+---------+-----------+----------+-------------------+   +----+---------------+---------+-----------+----------+--------------+ LEFTCompressibilityPhasicitySpontaneityPropertiesThrombus Aging +----+---------------+---------+-----------+----------+--------------+ CFV Full           Yes       Yes                                 +----+---------------+---------+-----------+----------+--------------+     *See  table(s) above for measurements and observations.    Preliminary    US Abdomen Limited RUQ (LIVER/GB)  Result Date: 05/17/2021 CLINICAL DATA:  Transaminitis. EXAM: ULTRASOUND ABDOMEN LIMITED RIGHT UPPER QUADRANT COMPARISON:  Abdominal ultrasound dated March 29, 2010. FINDINGS: Gallbladder: Surgically absent. Common bile duct: Diameter: 6 mm, normal. Liver: No focal lesion identified. Heterogeneously increased parenchymal echogenicity. Portal vein is patent on color Doppler imaging with normal direction of blood flow towards the liver. Other: None. IMPRESSION: 1. Heterogeneously increased parenchymal echogenicity in the liver is nonspecific, but could reflect underlying steatosis or other liver disease. 2. Prior cholecystectomy. Electronically Signed   By: Titus Dubin M.D.   On: 05/17/2021 09:18        Scheduled Meds:  carvedilol  3.125 mg Oral BID WC   chlorhexidine  15 mL Mouth Rinse BID   Chlorhexidine Gluconate Cloth  6 each Topical Daily   docusate sodium  100 mg Oral BID   enoxaparin (LOVENOX) injection  40 mg Subcutaneous Q12H   gabapentin  800 mg Oral TID   guaiFENesin  600 mg Oral BID   insulin aspart  0-15 Units Subcutaneous TID WC   insulin aspart  0-5 Units Subcutaneous QHS   insulin glargine-yfgn  20 Units Subcutaneous QHS   ipratropium-albuterol  3 mL Nebulization TID   isosorbide-hydrALAZINE  1 tablet Oral TID   linaclotide  290 mcg Oral QAC breakfast   loratadine  10 mg Oral Daily   mouth rinse  15 mL Mouth Rinse q12n4p   oseltamivir  75 mg Oral BID   pantoprazole  40 mg Oral Daily   Continuous Infusions:   LOS: 1 day   Time spent= 35 mins    Earlyne Feeser Arsenio Loader, MD [facility]  If 7PM-7AM, please contact night-coverage  05/18/2021, 7:45 AM

## 2021-05-18 NOTE — Consult Note (Signed)
I have been asked to see the patient by Dr. Gerlean Ren for placement of difficult foley.  History of present illness: 50 year old woman with multiple medical problems including CHF, diabetes, hypertension and obesity currently admitted to ICU for influenza A.  Urology consulted for difficulty catheterizing patient due to BMI of 70.  Patient has been retaining urine and was recently catheterized with in and out catheter and approximately 900 cc drained.  Patient requires approximately 4 people to do in and out catheter due to BMI.  She states she has no difficulty with urinating at home.   Review of systems: A 12 point comprehensive review of systems was obtained and is negative unless otherwise stated in the history of present illness.  Patient Active Problem List   Diagnosis Date Noted   Influenza A 05/17/2021   Flu 05/16/2021   Acute on chronic respiratory failure with hypoxia and hypercapnia (HCC) 04/17/2021   Abnormal CT of brain 04/17/2021   Polycythemia 04/17/2021   Chronic back pain 04/17/2021   Leukocytosis 04/17/2021   Episode of unresponsiveness 04/17/2021   Acute respiratory failure with hypoxia (HCC) 10/02/2020   Chronic diastolic CHF (congestive heart failure) ([place]) 04/29/2020   Essential hypertension 04/25/2020   Community acquired bacterial pneumonia 04/25/2020   Class 3 obesity ([place]) 04/25/2020   Diabetes mellitus due to underlying condition with diabetic autonomic neuropathy, unspecified whether long term insulin use ([place]) 04/24/2020   Acute respiratory failure ([place]) 04/24/2020   Asthmatic bronchitis 03/21/2020   Nocturnal hypoxia 03/21/2020   OSA (obstructive sleep apnea) 12/22/2019   Shortness of breath 08/09/2019   No-show for appointment 06/30/2019   Morbid obesity with BMI of 60.0-69.9, adult ([place]) 09/14/2014   Iron deficiency anemia due to chronic blood loss 09/14/2014   CN (constipation) 09/14/2014   Postoperative state 04/12/2014   Uterine fibroid 03/17/2014    DUB (dysfunctional uterine bleeding) 01/27/2014   Iron deficiency anemia due to chronic blood loss 04/09/2013   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   Menometrorrhagia 04/09/2013   Left hand pain 09/18/2011   High blood pressure    ANEMIA, IRON DEFICIENCY 08/06/2010   CHOLECYSTECTOMY, HX OF 04/09/2010   Dyslipidemia 02/13/2010   DEGENERATIVE DISC DISEASE, LUMBOSACRAL SPINE W/RADICULOPATHY 11/01/2009   BACK PAIN, LUMBAR, WITH RADICULOPATHY 10/30/2009   TRIGGER FINGER 02/03/2009   Carpal tunnel syndrome 12/21/2008   Headache(784.0) 11/09/2008   Depression 10/04/2008   URI 08/24/2008   INSOMNIA 08/24/2008   Asthma 06/06/2008   Morbid obesity ([place]) 05/24/2008   ESSENTIAL HYPERTENSION 05/24/2008    No current facility-administered medications on file prior to encounter.   Current Outpatient Medications on File Prior to Encounter  Medication Sig Dispense Refill   albuterol (PROVENTIL) (2.5 MG/3ML) 0.083% nebulizer solution USE 1 VIAL IN NEBULIZER EVERY 6 HOURS AS NEEDED FOR WHEEZING OR SHORTNESS OF BREATH (Patient taking differently: Take 2.5 mg by nebulization every 6 (six) hours as needed for wheezing or shortness of breath.) 150 mL 0   benzonatate (TESSALON) 200 MG capsule Take 1 capsule (200 mg total) by mouth 2 (two) times daily as needed for cough. 20 capsule 0   carvedilol (COREG) 3.125 MG tablet Take 1 tablet (3.125 mg total) by mouth 2 (two) times daily with a meal. 60 tablet 1   cetirizine (ZYRTEC ALLERGY) 10 MG tablet Take 1 tablet (10 mg total) by mouth daily. 90 tablet 3   cyclobenzaprine (FLEXERIL) 5 MG tablet Take 1 tablet by mouth three times daily as needed for muscle spasm 90 tablet  0   ibuprofen (ADVIL) 800 MG tablet Take 800 mg by mouth every 8 (eight) hours as needed for moderate pain.     insulin glargine (LANTUS) 100 UNIT/ML Solostar Pen Inject 20 Units into the skin at bedtime. 15 mL 11   LINZESS 290 MCG CAPS capsule TAKE 1 CAPSULE BY MOUTH ONCE DAILY BEFORE  BREAKFAST (Patient taking differently: Take 290 mcg by mouth daily before breakfast.) 90 capsule 0   naloxone (NARCAN) nasal spray 4 mg/0.1 mL Place 1 spray into the nose as needed.     omeprazole (PRILOSEC) 20 MG capsule Take 20 mg by mouth at bedtime.     Oxycodone HCl 10 MG TABS Take 10 mg by mouth every 4 (four) hours as needed (pain).     promethazine (PHENERGAN) 25 MG tablet Take 25 mg by mouth every 8 (eight) hours as needed for nausea or vomiting.     QUEtiapine (SEROQUEL) 400 MG tablet Take 400 mg by mouth at bedtime as needed (sleep).     torsemide (DEMADEX) 20 MG tablet Take 2 tablets by mouth once daily (Patient taking differently: Take 40 mg by mouth daily.) 60 tablet 0   vitamin B-12 (CYANOCOBALAMIN) 1000 MCG tablet Take 1,000 mcg by mouth daily.     vitamin C (ASCORBIC ACID) 500 MG tablet Take 500 mg by mouth daily.     ACCU-CHEK FASTCLIX LANCETS MISC USE TO CHECK BLOOD SUGAR TWICE DAILY  11   ACCU-CHEK GUIDE test strip 1 each by Other route 3 (three) times daily. Use as instructed 270 each 3   Fluticasone-Salmeterol (ADVAIR DISKUS) 250-50 MCG/DOSE AEPB Inhale 1 puff into the lungs 2 (two) times daily. (Patient not taking: Reported on 05/16/2021) 60 each 11   gabapentin (NEURONTIN) 800 MG tablet Take 1 tablet (800 mg total) by mouth 3 (three) times daily. 270 tablet 0   Insulin Pen Needle (PEN NEEDLES 31GX5/16") 31G X 8 MM MISC 1 pen by Does not apply route at bedtime. 100 each 2   isosorbide-hydrALAZINE (BIDIL) 20-37.5 MG tablet Take 1 tablet by mouth in the morning and at bedtime. 180 tablet 2    Past Medical History:  Diagnosis Date   Anemia    iv iron treatment dec 2014/ see oncology for this last in dec 2014   Anxiety    Arthritis    knee, hands   Asthma    seasonal related/ albuterol used when uri   CHF (congestive heart failure) (HCC)    CN (constipation) 09/14/2014   DEGENERATIVE DISC DISEASE, LUMBOSACRAL SPINE W/RADICULOPATHY 11/01/2009   Qualifier: Diagnosis of   By: Jorene Minors, [place]     Depression    Diabetes ([place])    H/O dizziness    Hx   Headache(784.0)    otc med prn   High blood pressure    HYPERLIPIDEMIA 02/13/2010   Qualifier: Diagnosis of  By: Jorene Minors, Scott     INSOMNIA 08/24/2008   Qualifier: Diagnosis of  By: Radene Ou MD, [place]     Morbid obesity ([place]) 05/24/2008   Qualifier: Diagnosis of  By: Radene Ou MD, [place]     SVD (spontaneous vaginal delivery)    x 3    Past Surgical History:  Procedure Laterality Date   ABDOMINAL HYSTERECTOMY  03/13/2014   total    BILATERAL SALPINGECTOMY Bilateral 04/12/2014   Procedure: BILATERAL SALPINGECTOMY;  Surgeon: Woodroe Mode, MD;  Location: [facility];  Service: Gynecology;  Laterality: Bilateral;   CARPAL TUNNEL RELEASE  08/30/2011  Procedure: CARPAL TUNNEL RELEASE;  Surgeon: Wynonia Sours, MD;  Location: [facility];  Service: Orthopedics;  Laterality: Right;   CHOLECYSTECTOMY  10/11   lap choli   GALLBLADDER SURGERY  04/04/2010   HYSTEROSCOPY N/A 02/08/2014   Procedure: HYSTEROSCOPY;  Surgeon: Woodroe Mode, MD;  Location: [facility];  Service: Gynecology;  Laterality: N/A;   HYSTEROSCOPY WITH NOVASURE N/A 02/08/2014   Procedure: ATTEMPTED NOVASURE ABLATION;  Surgeon: Woodroe Mode, MD;  Location: [facility];  Service: Gynecology;  Laterality: N/A;   INTRAUTERINE DEVICE INSERTION     07/19/2013   SEPTOPLASTY N/A 09/08/2013   Procedure: SEPTOPLASTY;  Surgeon: Ruby Cola, MD;  Location: [facility];  Service: ENT;  Laterality: N/A;   SPINE SURGERY     TONSILLECTOMY     TONSILLECTOMY AND ADENOIDECTOMY Bilateral 09/08/2013   Procedure: TONSILLECTOMY ;  Surgeon: Ruby Cola, MD;  Location: [facility];  Service: ENT;  Laterality: Bilateral;   TUBAL LIGATION     VAGINAL HYSTERECTOMY N/A 04/12/2014   Procedure: LAPAROSCOPIC ASSISTED VAGINAL HYSTERECTOMY;  Surgeon: Woodroe Mode, MD;  Location: [facility];  Service: Gynecology;  Laterality: N/A;   WISDOM TOOTH EXTRACTION      Social History    Tobacco Use   Smoking status: Former    Packs/day: 0.25    Types: Cigarettes    Quit date: 04/24/2020    Years since quitting: 1.0   Smokeless tobacco: Never   Tobacco comments:    4-5 cigarettes a day  Vaping Use   Vaping Use: Never used  Substance Use Topics   Alcohol use: No   Drug use: No    Family History  Problem Relation Age of Onset   Diabetes Mother    Hypertension Father    Heart disease Other    Diabetes Other    Hypertension Other    Alcohol abuse Other    Hypertension Brother    Asthma Brother    Hypertension Brother    Asthma Brother    COPD Maternal Grandmother    Lung cancer Maternal Grandmother    Asthma Paternal Grandmother    Asthma Paternal Aunt     PE: Vitals:   05/18/21 0700 05/18/21 0722 05/18/21 0800 05/18/21 1200  BP:      Pulse: 80     Resp: 20     Temp:   98 F (36.7 C) 98.5 F (36.9 C)  TempSrc:   Oral Oral  SpO2: 91% 93%    Weight:      Height:       Patient appears to be in no acute distress  patient is alert and oriented x3 Atraumatic normocephalic head No increased work of breathing, no audible wheezes/rhonchi Regular sinus rhythm/rate Abdomen is soft, nondistended,  Lower extremities are symmetric without appreciable edema Grossly neurologically intact No identifiable skin lesions  Recent Labs    05/16/21 1610 05/17/21 0434 05/18/21 0425  WBC 8.6 9.8 9.8  HGB 15.7* 16.2* 17.3*  HCT 48.9* 50.3* 55.5*   Recent Labs    05/16/21 1100 05/16/21 1610 05/17/21 0434 05/18/21 0549  NA 139  --  140 140  K 5.0  --  3.7 3.7  CL 93*  --  94* 96*  CO2 35*  --  36* 35*  GLUCOSE 153*  --  130* 154*  BUN 21*  --  24* 21*  CREATININE 1.00 1.03* 0.77 1.01*  CALCIUM 8.4*  --  8.4* 8.4*   No results for input(s): LABPT,  INR in the last 72 hours. No results for input(s): LABURIN in the last 72 hours. Results for orders placed or performed during the hospital encounter of 05/16/21  Resp Panel by RT-PCR (Flu A&B, Covid)  Nasopharyngeal Swab     Status: Abnormal   Collection Time: 05/16/21 10:38 AM   Specimen: Nasopharyngeal Swab; Nasopharyngeal(NP) swabs in vial transport medium  Result Value Ref Range Status   SARS Coronavirus 2 by RT PCR NEGATIVE NEGATIVE Final    Comment: (NOTE) SARS-CoV-2 target nucleic acids are NOT DETECTED.  The SARS-CoV-2 RNA is generally detectable in upper respiratory specimens during the acute phase of infection. The lowest concentration of SARS-CoV-2 viral copies this assay can detect is 138 copies/mL. A negative result does not preclude SARS-Cov-2 infection and should not be used as the sole basis for treatment or other patient management decisions. A negative result may occur with  improper specimen collection/handling, submission of specimen other than nasopharyngeal swab, presence of viral mutation(s) within the areas targeted by this assay, and inadequate number of viral copies(<138 copies/mL). A negative result must be combined with clinical observations, patient history, and epidemiological information. The expected result is Negative.  Fact Sheet for Patients:  EntrepreneurPulse.com.au  Fact Sheet for Healthcare Providers:  IncredibleEmployment.be  This test is no t yet approved or cleared by the [place] FDA and  has been authorized for detection and/or diagnosis of SARS-CoV-2 by FDA under an Emergency Use Authorization (EUA). This EUA will remain  in effect (meaning this test can be used) for the duration of the COVID-19 declaration under Section 564(b)(1) of the Act, 21 U.S.C.section 360bbb-3(b)(1), unless the authorization is terminated  or revoked sooner.       Influenza A by PCR POSITIVE (A) NEGATIVE Final   Influenza B by PCR NEGATIVE NEGATIVE Final    Comment: (NOTE) The Xpert Xpress SARS-CoV-2/FLU/RSV plus assay is intended as an aid in the diagnosis of influenza from Nasopharyngeal swab specimens  and should not be used as a sole basis for treatment. Nasal washings and aspirates are unacceptable for Xpert Xpress SARS-CoV-2/FLU/RSV testing.  Fact Sheet for Patients: EntrepreneurPulse.com.au  Fact Sheet for Healthcare Providers: IncredibleEmployment.be  This test is not yet approved or cleared by the [place] FDA and has been authorized for detection and/or diagnosis of SARS-CoV-2 by FDA under an Emergency Use Authorization (EUA). This EUA will remain in effect (meaning this test can be used) for the duration of the COVID-19 declaration under Section 564(b)(1) of the Act, 21 U.S.C. section 360bbb-3(b)(1), unless the authorization is terminated or revoked.  Performed at [facility], [place] [address], [place], [place] [address]   MRSA Next Gen by PCR, Nasal     Status: None   Collection Time: 05/18/21  4:55 AM   Specimen: Nasal Mucosa; Nasal Swab  Result Value Ref Range Status   MRSA by PCR Next Gen NOT DETECTED NOT DETECTED Final    Comment: (NOTE) The GeneXpert MRSA Assay (FDA approved for NASAL specimens only), is one component of a comprehensive MRSA colonization surveillance program. It is not intended to diagnose MRSA infection nor to guide or monitor treatment for MRSA infections. Test performance is not FDA approved in patients less than 63 years old. Performed at [facility] [place] [address] [place], [place] [address]     Imaging: Abdominal US IMPRESSION: 1. Heterogeneously increased parenchymal echogenicity in the liver is nonspecific, but could reflect underlying steatosis or other liver disease. 2. Prior cholecystectomy.  Electronically Signed   By: Titus Dubin M.D.   On: 05/17/2021 09:18  Imp/Recommendations: Difficult foley secondary to obesity- -16Fr foley placed by Urology in standard fashion using sterile technique ->200cc immediately drained after straight  catheter recently drained 900cc -recommend continuing foley for 48 hours to allow bladder to decompress  -void trial in AM -foley placement requires multiple people for exposure but is easily placed  Please page with any further questions or concerns. Daxtyn Rottenberg D Anthony Tamburo

## 2021-05-18 NOTE — Progress Notes (Addendum)
Initial Nutrition Assessment  DOCUMENTATION CODES:   Morbid obesity  INTERVENTION:  - will order Glucerna Shake BID, each supplement provides 220 kcal and 10 grams of protein. - will order 30 ml Prosource Plus BID, each supplement provides 100 kcal and 15 grams protein.  - will order 1 tablet multivitamin with minerals/day. - complete NFPE when feasible.    NUTRITION DIAGNOSIS:   Increased nutrient needs related to acute illness as evidenced by estimated needs.  GOAL:   Patient will meet greater than or equal to 90% of their needs  MONITOR:   PO intake, Supplement acceptance, Labs, Weight trends, I & O's  REASON FOR ASSESSMENT:   Malnutrition Screening Tool  ASSESSMENT:   50 year old female with medical history of HTN, CHF, type 2 DM, chronic pain, OSA, morbid obesity, depression, anxiety, asthma, arthritis, anemia, DDD of lumbar spine, insomnia, and HLD. She presented to the ED due to progressive shortness of breath x48 hours. In the ED she was found to be positive for the flu and was started on Tamiflu.  Unable to see patient despite several attempts d/t staff attempting to place foley, other staff-related needs, and now out of the room to CT.   Patient on Carb Modified diet and ate 0% of breakfast, 25% of lunch, and 0% of dinner yesterday. No documentation of breakfast from this AM.  She has not been seen by a  RD at any time in the past.   Weight on 11/23 was 467 lb and weight on 04/17/21 was 477 lb. This indicates 10 lb weight loss (2.1% body weight) in the past 1 month; not significant for time frame.   Per notes: - influenza A PNA - transaminitis - acute urinary retention   Labs reviewed; HgbA1c: 6.9%, CBGs: 201, 152, 113 mg/dl, Cl: 96 mmol/l, BUN: 21 mg/dl, creatinine: 1.01 mg/dl, Ca: 8.4 mg/dl, LFTs elevated.  Medications reviewed; 100 mg colace BID, 40 mg IV lasix x1 dose 11/25, sliding scale novolog, 20 units semglee/day, 40 mg oral  protonix/day.    NUTRITION - FOCUSED PHYSICAL EXAM:  Patient now out of the room.  Diet Order:   Diet Order             Diet Carb Modified Fluid consistency: Thin; Room service appropriate? Yes  Diet effective now                   EDUCATION NEEDS:   Not appropriate for education at this time  Skin:  Skin Assessment: Reviewed RN Assessment  Last BM:  PTA/unknown  Height:   Ht Readings from Last 1 Encounters:  05/16/21 5\' 5"  (1.651 m)    Weight:   Wt Readings from Last 1 Encounters:  05/16/21 (!) 212 kg     Estimated Nutritional Needs:  Kcal:  2200-2400 kcal Protein:  110-120 grams Fluid:  >/= 2.2 L/day      Jarome Matin, MS, RD, LDN, CNSC Inpatient Clinical Dietitian RD pager # available in [facility]  After hours/weekend pager # available in [facility]

## 2021-05-18 NOTE — Progress Notes (Signed)
Pt. unresponsive to verbal and tactile stimuli, noted with agonal breathing. Rapid Response nurse called, J. Olena Heckle NP contacted.

## 2021-05-18 NOTE — Progress Notes (Signed)
PT Cancellation Note  Patient Details Name: Cynthia Becker MRN: [id number] DOB: 1971-05-07   Cancelled Treatment:     PT order received but eval deferred.  Pt arrived in ICU this am following rapid response on third floor.  RN advises pt very fatigued this pm and waiting on results of CT scan to investigate ongoing knee pain after X-ray was inconclusive.  Will follow.   Graiden Henes 05/18/2021, 4:16 PM

## 2021-05-18 NOTE — Progress Notes (Signed)
RT inquire about cpap. Pt refused she said she wears only O2 at home. Told her it may benefit her to wear it but pt refused.

## 2021-05-18 NOTE — Progress Notes (Signed)
Rapid Response Event Note   Reason for Call :  Unresponsive  Initial Focused Assessment:  On arrival I witnessed the pt RN sternal rubbing the pt w/o success of arousal.      Interventions:  Oral suction brought to room - dark reddish brown emesis suctioned from mouth Narcan x1 CXR done Art blood gas done   Plan of Care: Pt will need BIPAP, control nausea to prevent vomiting again  Event Summary:  Pt transferred to ICU/SD  MD Notified: Gershon Cull, NP Call Time: [phone] Arrival Time: [place] Time: [place], RN

## 2021-05-18 NOTE — Progress Notes (Signed)
Pt spouse Myleka Moncure has been updated.

## 2021-05-19 DIAGNOSIS — M170 Bilateral primary osteoarthritis of knee: Secondary | ICD-10-CM

## 2021-05-19 DIAGNOSIS — M1711 Unilateral primary osteoarthritis, right knee: Secondary | ICD-10-CM | POA: Diagnosis not present

## 2021-05-19 DIAGNOSIS — J111 Influenza due to unidentified influenza virus with other respiratory manifestations: Secondary | ICD-10-CM | POA: Diagnosis not present

## 2021-05-19 LAB — COMPREHENSIVE METABOLIC PANEL
ALT: 269 U/L — ABNORMAL HIGH (ref 0–44)
AST: 149 U/L — ABNORMAL HIGH (ref 15–41)
Albumin: 2.9 g/dL — ABNORMAL LOW (ref 3.5–5.0)
Alkaline Phosphatase: 60 U/L (ref 38–126)
Anion gap: 6 (ref 5–15)
BUN: 16 mg/dL (ref 6–20)
CO2: 36 mmol/L — ABNORMAL HIGH (ref 22–32)
Calcium: 8.6 mg/dL — ABNORMAL LOW (ref 8.9–10.3)
Chloride: 93 mmol/L — ABNORMAL LOW (ref 98–111)
Creatinine, Ser: 0.74 mg/dL (ref 0.44–1.00)
GFR, Estimated: >60 mL/min (ref >60)
Glucose, Bld: 157 mg/dL — ABNORMAL HIGH (ref 70–99)
Potassium: 4.2 mmol/L (ref 3.5–5.1)
Sodium: 135 mmol/L (ref 135–145)
Total Bilirubin: 0.8 mg/dL (ref 0.3–1.2)
Total Protein: 7.4 g/dL (ref 6.5–8.1)

## 2021-05-19 LAB — CBC
HCT: 48.9 % — ABNORMAL HIGH (ref 36.0–46.0)
Hemoglobin: 15.2 g/dL — ABNORMAL HIGH (ref 12.0–15.0)
MCH: 24.6 pg — ABNORMAL LOW (ref 26.0–34.0)
MCHC: 31.1 g/dL (ref 30.0–36.0)
MCV: 79.1 fL — ABNORMAL LOW (ref 80.0–100.0)
Platelets: 234 10*3/uL (ref 150–400)
RBC: 6.18 MIL/uL — ABNORMAL HIGH (ref 3.87–5.11)
RDW: 17.9 % — ABNORMAL HIGH (ref 11.5–15.5)
WBC: 5.8 10*3/uL (ref 4.0–10.5)
nRBC: 1.2 % — ABNORMAL HIGH (ref 0.0–0.2)

## 2021-05-19 LAB — GLUCOSE, CAPILLARY
Glucose-Capillary: 144 mg/dL — ABNORMAL HIGH (ref 70–99)
Glucose-Capillary: 213 mg/dL — ABNORMAL HIGH (ref 70–99)
Glucose-Capillary: 151 mg/dL — ABNORMAL HIGH (ref 70–99)
Glucose-Capillary: 190 mg/dL — ABNORMAL HIGH (ref 70–99)

## 2021-05-19 MED ORDER — LIDOCAINE HCL 1 % IJ SOLN
10.0000 mL | Freq: Once | INTRAMUSCULAR | Status: AC
  Administered 2021-05-19: 5 mL
  Filled 2021-05-19: qty 20

## 2021-05-19 MED ORDER — TRIAMCINOLONE ACETONIDE 40 MG/ML IJ SUSP (RADIOLOGY): 50.0000 mg | Freq: Once | INTRAMUSCULAR | Status: DC

## 2021-05-19 MED ORDER — FUROSEMIDE 10 MG/ML IJ SOLN
40.0000 mg | Freq: Once | INTRAMUSCULAR | Status: AC
  Administered 2021-05-19: 40 mg via INTRAVENOUS
  Filled 2021-05-19: qty 4

## 2021-05-19 MED ORDER — LACTULOSE 10 GM/15ML PO SOLN
20.0000 g | Freq: Two times a day (BID) | ORAL | Status: AC
  Administered 2021-05-19 (×2): 20 g via ORAL
  Filled 2021-05-19 (×2): qty 30

## 2021-05-19 MED ORDER — TRIAMCINOLONE ACETONIDE 40 MG/ML IJ SUSP
40.0000 mg | Freq: Once | INTRAMUSCULAR | Status: AC
  Administered 2021-05-19: 40 mg via INTRA_ARTICULAR
  Filled 2021-05-19: qty 1

## 2021-05-19 MED ORDER — POLYETHYLENE GLYCOL 3350 17 G PO PACK
17.0000 g | PACK | Freq: Every day | ORAL | Status: DC
  Administered 2021-05-19 – 2021-05-23 (×5): 17 g via ORAL
  Filled 2021-05-19 (×7): qty 1

## 2021-05-19 NOTE — Progress Notes (Signed)
PT Cancellation Note  Patient Details Name: Cynthia Becker MRN: [id number] DOB: Sep 16, 1970   Cancelled Treatment:    Reason Eval/Treat Not Completed: Medical issues which prohibited therapy;Patient at procedure or test/unavailable. Defer today per RN, pt continues to have significant knee pain (CT yesterday). Ortho currently at bedside drawing fluid off knee.  Will continue efforts to see pt as schedule allows.   Walter Olin [facility] 05/19/2021, 9:41 AM

## 2021-05-19 NOTE — Procedures (Signed)
Procedure note:  Right knee injection. Verbal consent was obtained prior to injection.  Timeout was completed to confirm the site of injection  The medications used were 80 mg of Depo-Medrol and 4 cc 1% lidocaine.  Anesthesia was provided by ethyl chloride and the skin was prepped with alcohol and ChloraPrep.  After cleaning the skin with alcohol a 18-gauge needle was used to inject right knee. There were no complications. A sterile bandage was applied.

## 2021-05-19 NOTE — Consult Note (Signed)
ORTHOPAEDIC CONSULTATION  REQUESTING PHYSICIAN: Damita Lack, MD  Chief Complaint: right knee pain  HPI: Cynthia Becker is a 50 y.o. female who presents with right knee pain ongoing now for several years.  She does have a history of osteoarthritis.  She is getting injections from the pain clinic.  She is here with her husband today.  She does not specifically know where or when the last injection was performed but she does vaguely remember that this gave her significant pain relief.  She is very disabled from the arthritis in addition to baseline obesity.  She states that she gets around her house predominantly by walking on walls or using furniture to prop her up.  She is currently admitted to the stepdown unit with influenza.  She has multiple comorbidities including diabetes open obesity, obstructive sleep apnea.  She had a fall on the previous Wednesday at which point she said that she did flareup the knee pain.  Past Medical History:  Diagnosis Date   Anemia    iv iron treatment dec 2014/ see oncology for this last in dec 2014   Anxiety    Arthritis    knee, hands   Asthma    seasonal related/ albuterol used when uri   CHF (congestive heart failure) (HCC)    CN (constipation) 09/14/2014   DEGENERATIVE DISC DISEASE, LUMBOSACRAL SPINE W/RADICULOPATHY 11/01/2009   Qualifier: Diagnosis of  By: Jorene Minors, Scott     Depression    Diabetes ([place])    H/O dizziness    Hx   Headache(784.0)    otc med prn   High blood pressure    HYPERLIPIDEMIA 02/13/2010   Qualifier: Diagnosis of  By: Jorene Minors, Scott     INSOMNIA 08/24/2008   Qualifier: Diagnosis of  By: Radene Ou MD, [place]     Morbid obesity ([place]) 05/24/2008   Qualifier: Diagnosis of  By: Radene Ou MD, [place]     SVD (spontaneous vaginal delivery)    x 3   Past Surgical History:  Procedure Laterality Date   ABDOMINAL HYSTERECTOMY  03/13/2014   total    BILATERAL SALPINGECTOMY Bilateral 04/12/2014   Procedure: BILATERAL  SALPINGECTOMY;  Surgeon: Woodroe Mode, MD;  Location: [facility];  Service: Gynecology;  Laterality: Bilateral;   CARPAL TUNNEL RELEASE  08/30/2011   Procedure: CARPAL TUNNEL RELEASE;  Surgeon: Wynonia Sours, MD;  Location: [place];  Service: Orthopedics;  Laterality: Right;   CHOLECYSTECTOMY  10/11   lap choli   GALLBLADDER SURGERY  04/04/2010   HYSTEROSCOPY N/A 02/08/2014   Procedure: HYSTEROSCOPY;  Surgeon: Woodroe Mode, MD;  Location: [facility];  Service: Gynecology;  Laterality: N/A;   HYSTEROSCOPY WITH NOVASURE N/A 02/08/2014   Procedure: ATTEMPTED NOVASURE ABLATION;  Surgeon: Woodroe Mode, MD;  Location: [facility];  Service: Gynecology;  Laterality: N/A;   INTRAUTERINE DEVICE INSERTION     07/19/2013   SEPTOPLASTY N/A 09/08/2013   Procedure: SEPTOPLASTY;  Surgeon: Ruby Cola, MD;  Location: [facility];  Service: ENT;  Laterality: N/A;   SPINE SURGERY     TONSILLECTOMY     TONSILLECTOMY AND ADENOIDECTOMY Bilateral 09/08/2013   Procedure: TONSILLECTOMY ;  Surgeon: Ruby Cola, MD;  Location: [facility];  Service: ENT;  Laterality: Bilateral;   TUBAL LIGATION     VAGINAL HYSTERECTOMY N/A 04/12/2014   Procedure: LAPAROSCOPIC ASSISTED VAGINAL HYSTERECTOMY;  Surgeon: Woodroe Mode, MD;  Location: [facility];  Service: Gynecology;  Laterality: N/A;   WISDOM TOOTH  EXTRACTION     Social History   Socioeconomic History   Marital status: Married    Spouse name: Not on file   Number of children: 3   Years of education: Not on file   Highest education level: Not on file  Occupational History   Not on file  Tobacco Use   Smoking status: Former    Packs/day: 0.25    Types: Cigarettes    Quit date: 04/24/2020    Years since quitting: 1.0   Smokeless tobacco: Never   Tobacco comments:    4-5 cigarettes a day  Vaping Use   Vaping Use: Never used  Substance and Sexual Activity   Alcohol use: No   Drug use: No   Sexual activity: Not on file  Other Topics Concern   Not on file   Social History Narrative   Not on file   Social Determinants of Health   Financial Resource Strain: Not on file  Food Insecurity: Not on file  Transportation Needs: Not on file  Physical Activity: Not on file  Stress: Not on file  Social Connections: Not on file   Family History  Problem Relation Age of Onset   Diabetes Mother    Hypertension Father    Heart disease Other    Diabetes Other    Hypertension Other    Alcohol abuse Other    Hypertension Brother    Asthma Brother    Hypertension Brother    Asthma Brother    COPD Maternal Grandmother    Lung cancer Maternal Grandmother    Asthma Paternal Grandmother    Asthma Paternal Aunt    - negative except otherwise stated in the family history section No Known Allergies Prior to Admission medications   Medication Sig Start Date End Date Taking? Authorizing Provider  albuterol (PROVENTIL) (2.5 MG/3ML) 0.083% nebulizer solution USE 1 VIAL IN NEBULIZER EVERY 6 HOURS AS NEEDED FOR WHEEZING OR SHORTNESS OF BREATH Patient taking differently: Take 2.5 mg by nebulization every 6 (six) hours as needed for wheezing or shortness of breath. 03/19/21  Yes [name], NP  benzonatate (TESSALON) 200 MG capsule Take 1 capsule (200 mg total) by mouth 2 (two) times daily as needed for cough. 05/14/21  Yes Passmore, Jake Church I, NP  carvedilol (COREG) 3.125 MG tablet Take 1 tablet (3.125 mg total) by mouth 2 (two) times daily with a meal. 04/11/21 06/10/21 Yes Patwardhan, Manish J, MD  cetirizine (ZYRTEC ALLERGY) 10 MG tablet Take 1 tablet (10 mg total) by mouth daily. 01/08/21 01/08/22 Yes [name], NP  cyclobenzaprine (FLEXERIL) 5 MG tablet Take 1 tablet by mouth three times daily as needed for muscle spasm 05/02/21  Yes [name], NP  ibuprofen (ADVIL) 800 MG tablet Take 800 mg by mouth every 8 (eight) hours as needed for moderate pain. 12/07/20  Yes [provider]  insulin glargine (LANTUS) 100 UNIT/ML Solostar Pen  Inject 20 Units into the skin at bedtime. 12/27/20  Yes King, Diona Foley, NP  LINZESS 290 MCG CAPS capsule TAKE 1 CAPSULE BY MOUTH ONCE DAILY BEFORE BREAKFAST Patient taking differently: Take 290 mcg by mouth daily before breakfast. 04/02/21  Yes [name], NP  naloxone [facility]) nasal spray 4 mg/0.1 mL Place 1 spray into the nose as needed. 04/13/21  Yes [provider]  omeprazole (PRILOSEC) 20 MG capsule Take 20 mg by mouth at bedtime.   Yes [provider]  Oxycodone HCl 10 MG TABS Take 10  mg by mouth every 4 (four) hours as needed (pain).   Yes [provider]  promethazine (PHENERGAN) 25 MG tablet Take 25 mg by mouth every 8 (eight) hours as needed for nausea or vomiting.   Yes [provider]  QUEtiapine (SEROQUEL) 400 MG tablet Take 400 mg by mouth at bedtime as needed (sleep).   Yes [provider]  torsemide (DEMADEX) 20 MG tablet Take 2 tablets by mouth once daily Patient taking differently: Take 40 mg by mouth daily. 05/10/21  Yes [name], NP  vitamin B-12 (CYANOCOBALAMIN) 1000 MCG tablet Take 1,000 mcg by mouth daily.   Yes [provider]  vitamin C (ASCORBIC ACID) 500 MG tablet Take 500 mg by mouth daily.   Yes [provider]  ACCU-CHEK FASTCLIX LANCETS MISC USE TO CHECK BLOOD SUGAR TWICE DAILY 03/23/18   [provider]  ACCU-CHEK GUIDE test strip 1 each by Other route 3 (three) times daily. Use as instructed 01/08/21 01/08/22  [name], NP  Fluticasone-Salmeterol (ADVAIR DISKUS) 250-50 MCG/DOSE AEPB Inhale 1 puff into the lungs 2 (two) times daily. Patient not taking: Reported on 05/16/2021 12/02/19   Noemi Chapel P, DO  gabapentin (NEURONTIN) 800 MG tablet Take 1 tablet (800 mg total) by mouth 3 (three) times daily. 05/14/21 08/12/21  Passmore, Jake Church I, NP  Insulin Pen Needle (PEN NEEDLES 31GX5/16") 31G X 8 MM MISC 1 pen by Does not apply route at bedtime. 12/27/20   [name], NP   isosorbide-hydrALAZINE (BIDIL) 20-37.5 MG tablet Take 1 tablet by mouth in the morning and at bedtime. 01/14/20   Nigel Mormon, MD   DG Knee 1-2 Views Right  Result Date: 05/17/2021 CLINICAL DATA:  Knee pain EXAM: RIGHT KNEE - 1-2 VIEW COMPARISON:  Right knee x-ray 06/13/2020 FINDINGS: Somewhat limited due to body habitus and portable technique. No acute fracture identified. Mild-to-moderate narrowing of the patellofemoral joint and medial compartment with marginal osteophyte formation. IMPRESSION: Degenerative changes with no definite fracture identified. Electronically Signed   By: Ofilia Neas M.D.   On: 05/17/2021 13:18   CT KNEE RIGHT WO CONTRAST  Result Date: 05/18/2021 CLINICAL DATA:  Persistent right knee pain.  Knee instability. EXAM: CT OF THE RIGHT KNEE WITHOUT CONTRAST TECHNIQUE: Multidetector CT imaging of the right knee was performed according to the standard protocol. Multiplanar CT image reconstructions were also generated. COMPARISON:  Radiographs 05/17/2021 and 06/13/2020. FINDINGS: Bones/Joint/Cartilage There are age advanced tricompartmental degenerative changes with joint space narrowing, osteophytes and multiple intra-articular loose bodies. The joint space narrowing is greatest in the medial compartment. There is a moderate size knee joint effusion. No evidence of acute fracture, dislocation or avascular necrosis. Ligaments Suboptimally assessed by CT. There are central osteophytes in the intercondylar notch. Muscles and Tendons Intact extensor mechanism.  No focal muscular atrophy identified. Soft tissues Mild subcutaneous edema laterally in the distal thigh. No focal fluid collection, foreign body or soft tissue emphysema. IMPRESSION: 1. Age advanced tricompartmental osteoarthritis with multiple intra-articular loose bodies and a moderate-sized knee joint effusion. 2. No evidence of acute fracture or dislocation. 3. No significant soft tissue abnormalities identified.  Electronically Signed   By: Richardean Sale M.D.   On: 05/18/2021 15:15   DG CHEST PORT 1 VIEW  Result Date: 05/18/2021 CLINICAL DATA:  Aspiration, increasing shortness of breath. EXAM: PORTABLE CHEST 1 VIEW COMPARISON:  04/17/2021. FINDINGS: The heart is borderline enlarged the mediastinal structures are within normal limits. The pulmonary vasculature is prominent.  Lung volumes are low. No consolidation, effusion, or pneumothorax. No acute osseous abnormality. IMPRESSION: Borderline cardiomegaly with distended pulmonary vasculature. Electronically Signed   By: Brett Fairy M.D.   On: 05/18/2021 04:40   VAS [place] LOWER EXTREMITY VENOUS (DVT)  Result Date: 05/18/2021  Lower Venous DVT Study Patient Name:  Caelin Kamphaus  Date of Exam:   05/17/2021 Medical Rec #: [id number]      Accession #:    [id number] Date of Birth: 05-Jan-1971     Patient Gender: F Patient Age:   89 years Exam Location:  [facility] Procedure:      VAS [place] LOWER EXTREMITY VENOUS (DVT) Referring Phys: Gerlean Ren --------------------------------------------------------------------------------  Indications: Pain.  Limitations: Body habitus. Comparison Study: No previous exams Performing Technologist: Jody Hill RVT, RDMS  Examination Guidelines: A complete evaluation includes B-mode imaging, spectral Doppler, color Doppler, and power Doppler as needed of all accessible portions of each vessel. Bilateral testing is considered an integral part of a complete examination. Limited examinations for reoccurring indications may be performed as noted. The reflux portion of the exam is performed with the patient in reverse Trendelenburg.  +---------+---------------+---------+-----------+----------+-------------------+ RIGHT    CompressibilityPhasicitySpontaneityPropertiesThrombus Aging      +---------+---------------+---------+-----------+----------+-------------------+ CFV      Full                                                              +---------+---------------+---------+-----------+----------+-------------------+ SFJ      Full                                                             +---------+---------------+---------+-----------+----------+-------------------+ FV Prox  Full           Yes      Yes                                      +---------+---------------+---------+-----------+----------+-------------------+ FV Mid   Full           Yes      Yes                                      +---------+---------------+---------+-----------+----------+-------------------+ FV DistalFull           Yes      Yes                                      +---------+---------------+---------+-----------+----------+-------------------+ PFV      Full                                                             +---------+---------------+---------+-----------+----------+-------------------+ POP  Yes      Yes                  patent by color and                                                       doppler.            +---------+---------------+---------+-----------+----------+-------------------+ PTV      Full                                                             +---------+---------------+---------+-----------+----------+-------------------+ PERO                                                  Not visualized on                                                         this exam           +---------+---------------+---------+-----------+----------+-------------------+   +----+---------------+---------+-----------+----------+--------------+ LEFTCompressibilityPhasicitySpontaneityPropertiesThrombus Aging +----+---------------+---------+-----------+----------+--------------+ CFV Full           Yes      Yes                                 +----+---------------+---------+-----------+----------+--------------+     *See table(s) above for measurements and  observations. Electronically signed by Orlie Pollen on 05/18/2021 at 10:45:44 AM.    Final      Positive ROS: All other systems have been reviewed and were otherwise negative with the exception of those mentioned in the HPI and as above.  Physical Exam: General: No acute distress Cardiovascular: No pedal edema Respiratory: No cyanosis, no use of accessory musculature GI: No organomegaly, abdomen is soft and non-tender Skin: No lesions in the area of chief complaint Neurologic: Sensation intact distally Psychiatric: Patient is at baseline mood and affect Lymphatic: No axillary or cervical lymphadenopathy  MUSCULOSKELETAL:  Right knee stayed straight.  She is able to bend it to approximately 10 degrees without significant pain.  Independent Imaging Review: Right knee x-ray 3 views: Significant tricompartmental osteoarthritis  CT right knee Again severe tricompartmental osteoarthritis with bone-on-bone medially  Assessment: 50 year old female with right severe tricompartmental knee osteoarthritis which is the majority of her issue right now.  She have a fall on this in the preceding days which I believe have flared up osteoarthritis.  I was consulted by the medical team to help evaluate and treat this.  At this time I would recommend a steroid injection to help treat her pain.  She may be activity and ambulation as tolerated on this right side.  Plan: Recommend that she continue to follow-up with pain center for additional knee injections as she unfortunate would not be a candidate for knee  arthroplasty at this time.  Thank you for the consult and the opportunity to see Ms. Misiaszek  Vanetta Mulders, MD [facility] 9:32 AM

## 2021-05-19 NOTE — Progress Notes (Signed)
PROGRESS NOTE    Cynthia Becker  XFG:[id number] DOB: 30-Oct-1970 DOA: 05/16/2021 PCP: [name], NP   Brief Narrative:  50 year old with history of HTN, PAH, diastolic CHF, DM2, chronic pain, OSA chronic pain, obesity started having cold type symptoms about 48 hours prior to admission.  She was advised to get an x-ray by her outpatient provider but she did not feel well enough to get it done.  She ended up coming to the hospital due to shortness of breath.  She was found to be flu a positive.  Hospital course also complicated by transaminitis.  Acute hepatitis panel was negative.  She was started on Tamiflu.   Assessment & Plan:   Principal Problem:   Flu Active Problems:   Influenza A  Acute respiratory distress secondary to influenza A pneumonia -Still on 4-6L [place] this morning.  - Patient is still within timeframe of Tamiflu therefore it was initiated. - Bronchodilators scheduled and as needed, I-S/flutter - Supplemental oxygen as necessary, supplemental oxygen - BNP 19, Pro-Cal 0.5 - Last echocardiogram April 2022-EF 65%.  No wall motion abnormality  Right knee pain with moderate effusion - Knee x-ray shows degenerative disease. Right lower extremity Dopplers-prelim no DVT.  CT of the knee shows moderate knee effusion, osteoarthritis.  Elevated uric acid, started prednisone.  Seen by orthopedic, steroid and lidocaine injected 11/26.  Patient follows outpatient pain management, she will continue following with them.  Encephalopathy/somnolent; improved.  - Suspect secondary to CO2 narcosis versus narcotic use.  She needs to use BiPAP as needed.  Due to her morbid obesity, BNP can be falsely low therefore will give a dose of Lasix 40 mg IV.  Reduce dose of oxycodone IR to 5 mg at bedtime.  Discontinue bedtime trazodone, reduce gabapentin to 800 mg twice daily  Transaminitis; improving.  - Suspect this could be related to hepatic congestion - RUQ ultrasound showed hepatic  steatosis.  Acute hepatitis panel negative  Microcytic anemia - No obvious signs of blood loss, iron studies, TSH, B12 overall unremarkable.  DM2, insulin-dependent - A1c 6.9 - Sliding scale and Accu-Chek.  Lantus 20 units daily  Essential hypertension - Coreg, BiDil.  IV hydralazine and IV Lopressor as needed  Diastolic CHF, chronic.  EF 65% - Grossly does not appear to be in volume overload but will continue to monitor.  Home diuretics currently on hold.  Continue home Coreg and BiDil.  OSA Morbid obesity with BMI greater than 75 -Currently on off and on BiPAP but needs to continue using CPAP after this at qhs.   PAH - Not on any home meds.  This is likely combination of multiple issues including morbid obesity  Acute urinary retention - Difficult In and out cath. Foley placed with the help of Urology, appreciate help.     DVT prophylaxis: Lovenox Code Status: Full  Family Communication: Husband at bedside Inpatient, maintain hospital stay due to severe pain in the right lower extremity, abnormal LFTs and respiratory distress  Subjective: Still having right knee pain, remains on 5 to 6 L nasal cannula.  She understands that she needs to minimize use of narcotics  Review of Systems Otherwise negative except as per HPI, including: General: Denies fever, chills, night sweats or unintended weight loss. Resp: Denies cough, wheezing, shortness of breath. Cardiac: Denies chest pain, palpitations, orthopnea, paroxysmal nocturnal dyspnea. GI: Denies abdominal pain, nausea, vomiting, diarrhea or constipation GU: Denies dysuria, frequency, hesitancy or incontinence MS: Denies muscle aches, joint pain or swelling Neuro:  Denies headache, neurologic deficits (focal weakness, numbness, tingling), abnormal gait Psych: Denies anxiety, depression, SI/HI/AVH Skin: Denies new rashes or lesions ID: Denies sick contacts, exotic exposures, travel  Examination: Constitutional: Not in acute  distress, morbid obesity on 6 L nasal cannula while I was in the room Respiratory: Clear to auscultation bilaterally Cardiovascular: Normal sinus rhythm, no rubs Abdomen: Nontender nondistended good bowel sounds Musculoskeletal: Minimal flexion of the right knee Skin: No rashes seen Neurologic: CN 2-12 grossly intact.  And nonfocal Psychiatric: Normal judgment and insight. Alert and oriented x 3. Normal mood. Objective: Vitals:   05/19/21 0353 05/19/21 0400 05/19/21 0500 05/19/21 0600  BP:  (!) 142/73 (!) 149/82 (!) 151/91  Pulse:  69 78 74  Resp:  13 14 18   Temp: 98.4 F (36.9 C)     TempSrc: Axillary     SpO2:  93% 91% 91%  Weight:      Height:        Intake/Output Summary (Last 24 hours) at 05/19/2021 0815 Last data filed at 05/19/2021 0600 Gross per 24 hour  Intake --  Output 1735 ml  Net -1735 ml   Filed Weights   05/16/21 1019  Weight: (!) 212 kg     Data Reviewed:   CBC: Recent Labs  Lab 05/14/21 1246 05/16/21 1100 05/16/21 1100 05/16/21 1610 05/17/21 0434 05/17/21 0819 05/18/21 0425 05/19/21 0308  WBC 7.9 8.8  --  8.6 9.8  --  9.8 5.8  NEUTROABS 5.9 6.7  --   --   --   --   --   --   HGB 15.8 15.4*  --  15.7* 16.2*  --  17.3* 15.2*  HCT 49.0* 47.8*   < > 48.9* 50.3* 48.9* 55.5* 48.9*  MCV 77* 77.2*  --  78.4* 76.6*  --  80.7 79.1*  PLT 233 270  --  246 234  --  PLATELET CLUMPS NOTED ON SMEAR, COUNT APPEARS DECREASED 234   < > = values in this interval not displayed.   Basic Metabolic Panel: Recent Labs  Lab 05/14/21 1246 05/16/21 1100 05/16/21 1610 05/17/21 0434 05/18/21 0549 05/19/21 0308  NA 143 139  --  140 140 135  K 4.6 5.0  --  3.7 3.7 4.2  CL 96 93*  --  94* 96* 93*  CO2 29 35*  --  36* 35* 36*  GLUCOSE 111* 153*  --  130* 154* 157*  BUN 15 21*  --  24* 21* 16  CREATININE 1.01* 1.00 1.03* 0.77 1.01* 0.74  CALCIUM 9.2 8.4*  --  8.4* 8.4* 8.6*   GFR: Estimated Creatinine Clearance: 156.3 mL/min (by C-G formula based on SCr of  0.74 mg/dL). Liver Function Tests: Recent Labs  Lab 05/14/21 1246 05/16/21 1100 05/17/21 0434 05/18/21 0549 05/19/21 0308  AST 21 146* 349* 314* 149*  ALT 17 80* 248* 355* 269*  ALKPHOS 94 75 71 62 60  BILITOT 0.3 1.0 0.6 0.8 0.8  PROT 7.7 8.7* 8.5* 7.6 7.4  ALBUMIN 3.8 3.6 3.5 3.0* 2.9*   No results for input(s): LIPASE, AMYLASE in the last 168 hours. No results for input(s): AMMONIA in the last 168 hours. Coagulation Profile: No results for input(s): INR, PROTIME in the last 168 hours. Cardiac Enzymes: No results for input(s): CKTOTAL, CKMB, CKMBINDEX, TROPONINI in the last 168 hours. BNP (last 3 results) No results for input(s): PROBNP in the last 8760 hours. HbA1C: Recent Labs    05/16/21 1610  HGBA1C 6.9*  CBG: Recent Labs  Lab 05/18/21 0737 05/18/21 1135 05/18/21 1639 05/18/21 2013 05/18/21 2231  GLUCAP 152* 113* 110* 229* 214*   Lipid Profile: No results for input(s): CHOL, HDL, LDLCALC, TRIG, CHOLHDL, LDLDIRECT in the last 72 hours.  Thyroid Function Tests: Recent Labs    05/17/21 0819  TSH 1.720   Anemia Panel: Recent Labs    05/17/21 0819  VITAMINB12 309  FERRITIN 500*  TIBC 327  IRON 49   Sepsis Labs: Recent Labs  Lab 05/17/21 0819  PROCALCITON 0.55    Recent Results (from the past 240 hour(s))  Resp Panel by RT-PCR (Flu A&B, Covid) Nasopharyngeal Swab     Status: Abnormal   Collection Time: 05/16/21 10:38 AM   Specimen: Nasopharyngeal Swab; Nasopharyngeal(NP) swabs in vial transport medium  Result Value Ref Range Status   SARS Coronavirus 2 by RT PCR NEGATIVE NEGATIVE Final    Comment: (NOTE) SARS-CoV-2 target nucleic acids are NOT DETECTED.  The SARS-CoV-2 RNA is generally detectable in upper respiratory specimens during the acute phase of infection. The lowest concentration of SARS-CoV-2 viral copies this assay can detect is 138 copies/mL. A negative result does not preclude SARS-Cov-2 infection and should not be used as the  sole basis for treatment or other patient management decisions. A negative result may occur with  improper specimen collection/handling, submission of specimen other than nasopharyngeal swab, presence of viral mutation(s) within the areas targeted by this assay, and inadequate number of viral copies(<138 copies/mL). A negative result must be combined with clinical observations, patient history, and epidemiological information. The expected result is Negative.  Fact Sheet for Patients:  EntrepreneurPulse.com.au  Fact Sheet for Healthcare Providers:  IncredibleEmployment.be  This test is no t yet approved or cleared by the [place] FDA and  has been authorized for detection and/or diagnosis of SARS-CoV-2 by FDA under an Emergency Use Authorization (EUA). This EUA will remain  in effect (meaning this test can be used) for the duration of the COVID-19 declaration under Section 564(b)(1) of the Act, 21 U.S.C.section 360bbb-3(b)(1), unless the authorization is terminated  or revoked sooner.       Influenza A by PCR POSITIVE (A) NEGATIVE Final   Influenza B by PCR NEGATIVE NEGATIVE Final    Comment: (NOTE) The Xpert Xpress SARS-CoV-2/FLU/RSV plus assay is intended as an aid in the diagnosis of influenza from Nasopharyngeal swab specimens and should not be used as a sole basis for treatment. Nasal washings and aspirates are unacceptable for Xpert Xpress SARS-CoV-2/FLU/RSV testing.  Fact Sheet for Patients: EntrepreneurPulse.com.au  Fact Sheet for Healthcare Providers: IncredibleEmployment.be  This test is not yet approved or cleared by the [place] FDA and has been authorized for detection and/or diagnosis of SARS-CoV-2 by FDA under an Emergency Use Authorization (EUA). This EUA will remain in effect (meaning this test can be used) for the duration of the COVID-19 declaration under Section 564(b)(1) of  the Act, 21 U.S.C. section 360bbb-3(b)(1), unless the authorization is terminated or revoked.  Performed at [facility], [place] [address], [place], [place] [address]   MRSA Next Gen by PCR, Nasal     Status: None   Collection Time: 05/18/21  4:55 AM   Specimen: Nasal Mucosa; Nasal Swab  Result Value Ref Range Status   MRSA by PCR Next Gen NOT DETECTED NOT DETECTED Final    Comment: (NOTE) The GeneXpert MRSA Assay (FDA approved for NASAL specimens only), is one component of a comprehensive MRSA colonization surveillance program. It  is not intended to diagnose MRSA infection nor to guide or monitor treatment for MRSA infections. Test performance is not FDA approved in patients less than 53 years old. Performed at [facility], [place] [address] [place], [place] [address]          Radiology Studies: DG Knee 1-2 Views Right  Result Date: 05/17/2021 CLINICAL DATA:  Knee pain EXAM: RIGHT KNEE - 1-2 VIEW COMPARISON:  Right knee x-ray 06/13/2020 FINDINGS: Somewhat limited due to body habitus and portable technique. No acute fracture identified. Mild-to-moderate narrowing of the patellofemoral joint and medial compartment with marginal osteophyte formation. IMPRESSION: Degenerative changes with no definite fracture identified. Electronically Signed   By: Ofilia Neas M.D.   On: 05/17/2021 13:18   CT KNEE RIGHT WO CONTRAST  Result Date: 05/18/2021 CLINICAL DATA:  Persistent right knee pain.  Knee instability. EXAM: CT OF THE RIGHT KNEE WITHOUT CONTRAST TECHNIQUE: Multidetector CT imaging of the right knee was performed according to the standard protocol. Multiplanar CT image reconstructions were also generated. COMPARISON:  Radiographs 05/17/2021 and 06/13/2020. FINDINGS: Bones/Joint/Cartilage There are age advanced tricompartmental degenerative changes with joint space narrowing, osteophytes and multiple intra-articular loose bodies. The joint space  narrowing is greatest in the medial compartment. There is a moderate size knee joint effusion. No evidence of acute fracture, dislocation or avascular necrosis. Ligaments Suboptimally assessed by CT. There are central osteophytes in the intercondylar notch. Muscles and Tendons Intact extensor mechanism.  No focal muscular atrophy identified. Soft tissues Mild subcutaneous edema laterally in the distal thigh. No focal fluid collection, foreign body or soft tissue emphysema. IMPRESSION: 1. Age advanced tricompartmental osteoarthritis with multiple intra-articular loose bodies and a moderate-sized knee joint effusion. 2. No evidence of acute fracture or dislocation. 3. No significant soft tissue abnormalities identified. Electronically Signed   By: Richardean Sale M.D.   On: 05/18/2021 15:15   DG CHEST PORT 1 VIEW  Result Date: 05/18/2021 CLINICAL DATA:  Aspiration, increasing shortness of breath. EXAM: PORTABLE CHEST 1 VIEW COMPARISON:  04/17/2021. FINDINGS: The heart is borderline enlarged the mediastinal structures are within normal limits. The pulmonary vasculature is prominent. Lung volumes are low. No consolidation, effusion, or pneumothorax. No acute osseous abnormality. IMPRESSION: Borderline cardiomegaly with distended pulmonary vasculature. Electronically Signed   By: Brett Fairy M.D.   On: 05/18/2021 04:40   VAS [place] LOWER EXTREMITY VENOUS (DVT)  Result Date: 05/18/2021  Lower Venous DVT Study Patient Name:  Cynthia Becker  Date of Exam:   05/17/2021 Medical Rec #: [id number]      Accession #:    [id number] Date of Birth: 25-Dec-1970     Patient Gender: F Patient Age:   38 years Exam Location:  [facility] Procedure:      VAS [place] LOWER EXTREMITY VENOUS (DVT) Referring Phys: Gerlean Ren --------------------------------------------------------------------------------  Indications: Pain.  Limitations: Body habitus. Comparison Study: No previous exams Performing Technologist: Jody Hill RVT, RDMS   Examination Guidelines: A complete evaluation includes B-mode imaging, spectral Doppler, color Doppler, and power Doppler as needed of all accessible portions of each vessel. Bilateral testing is considered an integral part of a complete examination. Limited examinations for reoccurring indications may be performed as noted. The reflux portion of the exam is performed with the patient in reverse Trendelenburg.  +---------+---------------+---------+-----------+----------+-------------------+ RIGHT    CompressibilityPhasicitySpontaneityPropertiesThrombus Aging      +---------+---------------+---------+-----------+----------+-------------------+ CFV      Full                                                             +---------+---------------+---------+-----------+----------+-------------------+  SFJ      Full                                                             +---------+---------------+---------+-----------+----------+-------------------+ FV Prox  Full           Yes      Yes                                      +---------+---------------+---------+-----------+----------+-------------------+ FV Mid   Full           Yes      Yes                                      +---------+---------------+---------+-----------+----------+-------------------+ FV DistalFull           Yes      Yes                                      +---------+---------------+---------+-----------+----------+-------------------+ PFV      Full                                                             +---------+---------------+---------+-----------+----------+-------------------+ POP                     Yes      Yes                  patent by color and                                                       doppler.            +---------+---------------+---------+-----------+----------+-------------------+ PTV      Full                                                              +---------+---------------+---------+-----------+----------+-------------------+ PERO                                                  Not visualized on  this exam           +---------+---------------+---------+-----------+----------+-------------------+   +----+---------------+---------+-----------+----------+--------------+ LEFTCompressibilityPhasicitySpontaneityPropertiesThrombus Aging +----+---------------+---------+-----------+----------+--------------+ CFV Full           Yes      Yes                                 +----+---------------+---------+-----------+----------+--------------+     *See table(s) above for measurements and observations. Electronically signed by Orlie Pollen on 05/18/2021 at 10:45:44 AM.    Final    US Abdomen Limited RUQ (LIVER/GB)  Result Date: 05/17/2021 CLINICAL DATA:  Transaminitis. EXAM: ULTRASOUND ABDOMEN LIMITED RIGHT UPPER QUADRANT COMPARISON:  Abdominal ultrasound dated March 29, 2010. FINDINGS: Gallbladder: Surgically absent. Common bile duct: Diameter: 6 mm, normal. Liver: No focal lesion identified. Heterogeneously increased parenchymal echogenicity. Portal vein is patent on color Doppler imaging with normal direction of blood flow towards the liver. Other: None. IMPRESSION: 1. Heterogeneously increased parenchymal echogenicity in the liver is nonspecific, but could reflect underlying steatosis or other liver disease. 2. Prior cholecystectomy. Electronically Signed   By: Titus Dubin M.D.   On: 05/17/2021 09:18        Scheduled Meds:  (feeding supplement) PROSource Plus  30 mL Oral BID BM   carvedilol  3.125 mg Oral BID WC   chlorhexidine  15 mL Mouth Rinse BID   Chlorhexidine Gluconate Cloth  6 each Topical Daily   docusate sodium  100 mg Oral BID   enoxaparin (LOVENOX) injection  40 mg Subcutaneous Q12H   feeding supplement (GLUCERNA SHAKE)  237 mL Oral BID BM    gabapentin  800 mg Oral BID   guaiFENesin  600 mg Oral BID   insulin aspart  0-15 Units Subcutaneous TID WC   insulin aspart  0-5 Units Subcutaneous QHS   insulin glargine-yfgn  20 Units Subcutaneous QHS   ipratropium-albuterol  3 mL Nebulization TID   isosorbide-hydrALAZINE  1 tablet Oral TID   linaclotide  290 mcg Oral QAC breakfast   loratadine  10 mg Oral Daily   mouth rinse  15 mL Mouth Rinse q12n4p   multivitamin with minerals  1 tablet Oral Daily   oseltamivir  75 mg Oral BID   pantoprazole  40 mg Oral Daily   predniSONE  20 mg Oral Q breakfast   Continuous Infusions:   LOS: 2 days   Time spent= 35 mins    Hasani Diemer Arsenio Loader, MD [facility]  If 7PM-7AM, please contact night-coverage  05/19/2021, 8:15 AM

## 2021-05-19 NOTE — Progress Notes (Signed)
Refused cpap again tonight.

## 2021-05-20 DIAGNOSIS — J111 Influenza due to unidentified influenza virus with other respiratory manifestations: Secondary | ICD-10-CM | POA: Diagnosis not present

## 2021-05-20 LAB — COMPREHENSIVE METABOLIC PANEL
ALT: 185 U/L — ABNORMAL HIGH (ref 0–44)
AST: 71 U/L — ABNORMAL HIGH (ref 15–41)
Albumin: 3.2 g/dL — ABNORMAL LOW (ref 3.5–5.0)
Alkaline Phosphatase: 58 U/L (ref 38–126)
Anion gap: 9 (ref 5–15)
BUN: 16 mg/dL (ref 6–20)
CO2: 39 mmol/L — ABNORMAL HIGH (ref 22–32)
Calcium: 9.1 mg/dL (ref 8.9–10.3)
Chloride: 91 mmol/L — ABNORMAL LOW (ref 98–111)
Creatinine, Ser: 0.69 mg/dL (ref 0.44–1.00)
GFR, Estimated: >60 mL/min (ref >60)
Glucose, Bld: 135 mg/dL — ABNORMAL HIGH (ref 70–99)
Potassium: 3.4 mmol/L — ABNORMAL LOW (ref 3.5–5.1)
Sodium: 139 mmol/L (ref 135–145)
Total Bilirubin: 0.9 mg/dL (ref 0.3–1.2)
Total Protein: 7.6 g/dL (ref 6.5–8.1)

## 2021-05-20 LAB — CBC
HCT: 49.8 % — ABNORMAL HIGH (ref 36.0–46.0)
Hemoglobin: 15.6 g/dL — ABNORMAL HIGH (ref 12.0–15.0)
MCH: 24.7 pg — ABNORMAL LOW (ref 26.0–34.0)
MCHC: 31.3 g/dL (ref 30.0–36.0)
MCV: 78.8 fL — ABNORMAL LOW (ref 80.0–100.0)
Platelets: 245 10*3/uL (ref 150–400)
RBC: 6.32 MIL/uL — ABNORMAL HIGH (ref 3.87–5.11)
RDW: 18.1 % — ABNORMAL HIGH (ref 11.5–15.5)
WBC: 6.7 10*3/uL (ref 4.0–10.5)
nRBC: 1.2 % — ABNORMAL HIGH (ref 0.0–0.2)

## 2021-05-20 LAB — GLUCOSE, CAPILLARY
Glucose-Capillary: 136 mg/dL — ABNORMAL HIGH (ref 70–99)
Glucose-Capillary: 227 mg/dL — ABNORMAL HIGH (ref 70–99)
Glucose-Capillary: 160 mg/dL — ABNORMAL HIGH (ref 70–99)
Glucose-Capillary: 192 mg/dL — ABNORMAL HIGH (ref 70–99)

## 2021-05-20 MED ORDER — FUROSEMIDE 10 MG/ML IJ SOLN
40.0000 mg | Freq: Once | INTRAMUSCULAR | Status: AC
  Administered 2021-05-20: 10:00:00 40 mg via INTRAVENOUS
  Filled 2021-05-20: qty 4

## 2021-05-20 MED ORDER — POTASSIUM CHLORIDE CRYS ER 20 MEQ PO TBCR
40.0000 meq | EXTENDED_RELEASE_TABLET | Freq: Once | ORAL | Status: AC
  Administered 2021-05-20: 10:00:00 40 meq via ORAL
  Filled 2021-05-20: qty 2

## 2021-05-20 MED ORDER — ACETAZOLAMIDE 250 MG PO TABS
250.0000 mg | ORAL_TABLET | Freq: Two times a day (BID) | ORAL | Status: AC
  Administered 2021-05-20 (×2): 250 mg via ORAL
  Filled 2021-05-20 (×4): qty 1

## 2021-05-20 MED ORDER — INSULIN GLARGINE-YFGN 100 UNIT/ML ~~LOC~~ SOLN
25.0000 [IU] | Freq: Every day | SUBCUTANEOUS | Status: DC
  Administered 2021-05-20 – 2021-05-24 (×5): 25 [IU] via SUBCUTANEOUS
  Filled 2021-05-20 (×6): qty 0.25

## 2021-05-20 NOTE — Progress Notes (Signed)
Report given to RN on 3W. Pt transferring to room 1328.

## 2021-05-20 NOTE — Progress Notes (Signed)
PROGRESS NOTE    Cynthia Becker  HFW:[id number] DOB: 09-09-1970 DOA: 05/16/2021 PCP: [name], NP   Brief Narrative:  50 year old with history of HTN, PAH, diastolic CHF, DM2, chronic pain, OSA chronic pain, obesity started having cold type symptoms about 48 hours prior to admission.  She was advised to get an x-ray by her outpatient provider but she did not feel well enough to get it done.  She ended up coming to the hospital due to shortness of breath.  She was found to be flu a positive.  Hospital course also complicated by transaminitis.  Acute hepatitis panel was negative.  She was started on Tamiflu.  Due to right knee pain CT was done that showed moderate effusion, uric acid was elevated.  Seen by orthopedic steroid/lidocaine.   Assessment & Plan:   Principal Problem:   Flu Active Problems:   Influenza A   Unilateral primary osteoarthritis, right knee  Acute respiratory distress secondary to influenza A pneumonia Mild diastolic congestive heart failure exacerbation, class III. - 4 L nasal cannula this morning. - Complete Tamiflu course - Bronchodilators scheduled and as needed, I-S/flutter - Supplemental oxygen as necessary, supplemental oxygen - BNP 19, Pro-Cal 0.5 - Last echocardiogram April 2022-EF 65%.  No wall motion abnormality -Lasix 40 mg IV once, 1 dose of Diamox  Right knee pain with moderate effusion, improved - Knee x-ray shows degenerative disease. Right lower extremity Dopplers-prelim no DVT.  CT of the knee shows moderate knee effusion, osteoarthritis.  Elevated uric acid, started prednisone.  Seen by orthopedic, steroid and lidocaine injected 11/26.  Patient follows outpatient pain management, she will continue following with them.  Encephalopathy/somnolent; resolved - Suspect secondary to CO2 narcosis versus narcotic use.  She needs to use BiPAP as needed.  Due to her morbid obesity, BNP can be falsely low therefore will give a dose of Lasix 40 mg IV.   Reduce dose of oxycodone IR to 5 mg at bedtime.  Discontinue bedtime trazodone, reduce gabapentin to 800 mg twice daily  Transaminitis; improved - Suspect this could be related to hepatic congestion - RUQ ultrasound showed hepatic steatosis.  Acute hepatitis panel negative  Microcytic anemia - No obvious signs of blood loss, iron studies, TSH, B12 overall unremarkable.  DM2, insulin-dependent - A1c 6.9 - Sliding scale and Accu-Chek.  Increase Lantus 25 units daily  Essential hypertension - Coreg, BiDil.  IV hydralazine and IV Lopressor as needed  Diastolic CHF, chronic.  EF 65% - Grossly does not appear to be in volume overload but will continue to monitor.  Home diuretics currently on hold.  Continue home Coreg and BiDil.  OSA Morbid obesity with BMI greater than 75 -Currently on off and on BiPAP but needs to continue using CPAP after this at qhs.   PAH - Not on any home meds.  This is likely combination of multiple issues including morbid obesity  Acute urinary retention - Difficult In and out cath. Foley placed with the help of Urology, appreciate help.  Allow bladder decompression today, plans to remove it tomorrow  Now that her pain is better controlled, she will need PT/OT  DVT prophylaxis: Lovenox Code Status: Full  Family Communication: Husband is present at bedside Inpatient, maintain hospital stay due to severe pain in the right lower extremity, abnormal LFTs and respiratory distress.  Plan is to get PT/OT  Subjective: Still having right knee pain but improved  Review of Systems Otherwise negative except as per HPI, including: General: Denies fever,  chills, night sweats or unintended weight loss. Resp: Denies cough, wheezing, shortness of breath. Cardiac: Denies chest pain, palpitations, orthopnea, paroxysmal nocturnal dyspnea. GI: Denies abdominal pain, nausea, vomiting, diarrhea or constipation GU: Denies dysuria, frequency, hesitancy or incontinence MS:  Denies muscle aches, joint pain or swelling Neuro: Denies headache, neurologic deficits (focal weakness, numbness, tingling), abnormal gait Psych: Denies anxiety, depression, SI/HI/AVH Skin: Denies new rashes or lesions ID: Denies sick contacts, exotic exposures, travel  Examination: Constitutional: Not in acute distress, 4 L nasal cannula Respiratory: Clear to auscultation bilaterally Cardiovascular: Normal sinus rhythm, no rubs Abdomen: Nontender nondistended good bowel sounds Musculoskeletal: Improved flexion of her right knees Skin: No rashes seen Neurologic: CN 2-12 grossly intact.  And nonfocal Psychiatric: Normal judgment and insight. Alert and oriented x 3. Normal mood. Objective: Vitals:   05/20/21 0400 05/20/21 0500 05/20/21 0827 05/20/21 0908  BP: 135/82     Pulse: 67 63    Resp: 17 15    Temp: 98.3 F (36.8 C)  98 F (36.7 C)   TempSrc: Oral  Oral   SpO2: 100% 100%  100%  Weight:      Height:        Intake/Output Summary (Last 24 hours) at 05/20/2021 1020 Last data filed at 05/20/2021 0634 Gross per 24 hour  Intake --  Output 2355 ml  Net -2355 ml   Filed Weights   05/16/21 1019  Weight: (!) 212 kg     Data Reviewed:   CBC: Recent Labs  Lab 05/14/21 1246 05/16/21 1100 05/16/21 1100 05/16/21 1610 05/17/21 0434 05/17/21 0819 05/18/21 0425 05/19/21 0308 05/20/21 0323  WBC 7.9 8.8   < > 8.6 9.8  --  9.8 5.8 6.7  NEUTROABS 5.9 6.7  --   --   --   --   --   --   --   HGB 15.8 15.4*   < > 15.7* 16.2*  --  17.3* 15.2* 15.6*  HCT 49.0* 47.8*   < > 48.9* 50.3* 48.9* 55.5* 48.9* 49.8*  MCV 77* 77.2*   < > 78.4* 76.6*  --  80.7 79.1* 78.8*  PLT 233 270   < > 246 234  --  PLATELET CLUMPS NOTED ON SMEAR, COUNT APPEARS DECREASED 234 245   < > = values in this interval not displayed.   Basic Metabolic Panel: Recent Labs  Lab 05/16/21 1100 05/16/21 1610 05/17/21 0434 05/18/21 0549 05/19/21 0308 05/20/21 0323  NA 139  --  140 140 135 139  K 5.0  --   3.7 3.7 4.2 3.4*  CL 93*  --  94* 96* 93* 91*  CO2 35*  --  36* 35* 36* 39*  GLUCOSE 153*  --  130* 154* 157* 135*  BUN 21*  --  24* 21* 16 16  CREATININE 1.00 1.03* 0.77 1.01* 0.74 0.69  CALCIUM 8.4*  --  8.4* 8.4* 8.6* 9.1   GFR: Estimated Creatinine Clearance: 156.3 mL/min (by C-G formula based on SCr of 0.69 mg/dL). Liver Function Tests: Recent Labs  Lab 05/16/21 1100 05/17/21 0434 05/18/21 0549 05/19/21 0308 05/20/21 0323  AST 146* 349* 314* 149* 71*  ALT 80* 248* 355* 269* 185*  ALKPHOS 75 71 62 60 58  BILITOT 1.0 0.6 0.8 0.8 0.9  PROT 8.7* 8.5* 7.6 7.4 7.6  ALBUMIN 3.6 3.5 3.0* 2.9* 3.2*   No results for input(s): LIPASE, AMYLASE in the last 168 hours. No results for input(s): AMMONIA in the last 168 hours. Coagulation Profile:  No results for input(s): INR, PROTIME in the last 168 hours. Cardiac Enzymes: No results for input(s): CKTOTAL, CKMB, CKMBINDEX, TROPONINI in the last 168 hours. BNP (last 3 results) No results for input(s): PROBNP in the last 8760 hours. HbA1C: No results for input(s): HGBA1C in the last 72 hours.  CBG: Recent Labs  Lab 05/19/21 0820 05/19/21 1200 05/19/21 1637 05/19/21 2124 05/20/21 0809  GLUCAP 144* 213* 151* 190* 136*   Lipid Profile: No results for input(s): CHOL, HDL, LDLCALC, TRIG, CHOLHDL, LDLDIRECT in the last 72 hours.  Thyroid Function Tests: No results for input(s): TSH, T4TOTAL, FREET4, T3FREE, THYROIDAB in the last 72 hours.  Anemia Panel: No results for input(s): VITAMINB12, FOLATE, FERRITIN, TIBC, IRON, RETICCTPCT in the last 72 hours.  Sepsis Labs: Recent Labs  Lab 05/17/21 0819  PROCALCITON 0.55    Recent Results (from the past 240 hour(s))  Resp Panel by RT-PCR (Flu A&B, Covid) Nasopharyngeal Swab     Status: Abnormal   Collection Time: 05/16/21 10:38 AM   Specimen: Nasopharyngeal Swab; Nasopharyngeal(NP) swabs in vial transport medium  Result Value Ref Range Status   SARS Coronavirus 2 by RT PCR  NEGATIVE NEGATIVE Final    Comment: (NOTE) SARS-CoV-2 target nucleic acids are NOT DETECTED.  The SARS-CoV-2 RNA is generally detectable in upper respiratory specimens during the acute phase of infection. The lowest concentration of SARS-CoV-2 viral copies this assay can detect is 138 copies/mL. A negative result does not preclude SARS-Cov-2 infection and should not be used as the sole basis for treatment or other patient management decisions. A negative result may occur with  improper specimen collection/handling, submission of specimen other than nasopharyngeal swab, presence of viral mutation(s) within the areas targeted by this assay, and inadequate number of viral copies(<138 copies/mL). A negative result must be combined with clinical observations, patient history, and epidemiological information. The expected result is Negative.  Fact Sheet for Patients:  EntrepreneurPulse.com.au  Fact Sheet for Healthcare Providers:  IncredibleEmployment.be  This test is no t yet approved or cleared by the [place] FDA and  has been authorized for detection and/or diagnosis of SARS-CoV-2 by FDA under an Emergency Use Authorization (EUA). This EUA will remain  in effect (meaning this test can be used) for the duration of the COVID-19 declaration under Section 564(b)(1) of the Act, 21 U.S.C.section 360bbb-3(b)(1), unless the authorization is terminated  or revoked sooner.       Influenza A by PCR POSITIVE (A) NEGATIVE Final   Influenza B by PCR NEGATIVE NEGATIVE Final    Comment: (NOTE) The Xpert Xpress SARS-CoV-2/FLU/RSV plus assay is intended as an aid in the diagnosis of influenza from Nasopharyngeal swab specimens and should not be used as a sole basis for treatment. Nasal washings and aspirates are unacceptable for Xpert Xpress SARS-CoV-2/FLU/RSV testing.  Fact Sheet for Patients: EntrepreneurPulse.com.au  Fact Sheet for  Healthcare Providers: IncredibleEmployment.be  This test is not yet approved or cleared by the [place] FDA and has been authorized for detection and/or diagnosis of SARS-CoV-2 by FDA under an Emergency Use Authorization (EUA). This EUA will remain in effect (meaning this test can be used) for the duration of the COVID-19 declaration under Section 564(b)(1) of the Act, 21 U.S.C. section 360bbb-3(b)(1), unless the authorization is terminated or revoked.  Performed at [facility], [place] [address], [place], [place] [address]   MRSA Next Gen by PCR, Nasal     Status: None   Collection Time: 05/18/21  4:55 AM  Specimen: Nasal Mucosa; Nasal Swab  Result Value Ref Range Status   MRSA by PCR Next Gen NOT DETECTED NOT DETECTED Final    Comment: (NOTE) The GeneXpert MRSA Assay (FDA approved for NASAL specimens only), is one component of a comprehensive MRSA colonization surveillance program. It is not intended to diagnose MRSA infection nor to guide or monitor treatment for MRSA infections. Test performance is not FDA approved in patients less than 2 years old. Performed at [facility], [place] [address], [place], [place] [address]          Radiology Studies: CT KNEE RIGHT WO CONTRAST  Result Date: 05/18/2021 CLINICAL DATA:  Persistent right knee pain.  Knee instability. EXAM: CT OF THE RIGHT KNEE WITHOUT CONTRAST TECHNIQUE: Multidetector CT imaging of the right knee was performed according to the standard protocol. Multiplanar CT image reconstructions were also generated. COMPARISON:  Radiographs 05/17/2021 and 06/13/2020. FINDINGS: Bones/Joint/Cartilage There are age advanced tricompartmental degenerative changes with joint space narrowing, osteophytes and multiple intra-articular loose bodies. The joint space narrowing is greatest in the medial compartment. There is a moderate size knee joint effusion. No evidence of acute  fracture, dislocation or avascular necrosis. Ligaments Suboptimally assessed by CT. There are central osteophytes in the intercondylar notch. Muscles and Tendons Intact extensor mechanism.  No focal muscular atrophy identified. Soft tissues Mild subcutaneous edema laterally in the distal thigh. No focal fluid collection, foreign body or soft tissue emphysema. IMPRESSION: 1. Age advanced tricompartmental osteoarthritis with multiple intra-articular loose bodies and a moderate-sized knee joint effusion. 2. No evidence of acute fracture or dislocation. 3. No significant soft tissue abnormalities identified. Electronically Signed   By: Richardean Sale M.D.   On: 05/18/2021 15:15        Scheduled Meds:  (feeding supplement) PROSource Plus  30 mL Oral BID BM   acetaZOLAMIDE  250 mg Oral BID   carvedilol  3.125 mg Oral BID WC   chlorhexidine  15 mL Mouth Rinse BID   Chlorhexidine Gluconate Cloth  6 each Topical Daily   docusate sodium  100 mg Oral BID   enoxaparin (LOVENOX) injection  40 mg Subcutaneous Q12H   feeding supplement (GLUCERNA SHAKE)  237 mL Oral BID BM   furosemide  40 mg Intravenous Once   gabapentin  800 mg Oral BID   guaiFENesin  600 mg Oral BID   insulin aspart  0-15 Units Subcutaneous TID WC   insulin aspart  0-5 Units Subcutaneous QHS   insulin glargine-yfgn  20 Units Subcutaneous QHS   ipratropium-albuterol  3 mL Nebulization TID   isosorbide-hydrALAZINE  1 tablet Oral TID   linaclotide  290 mcg Oral QAC breakfast   loratadine  10 mg Oral Daily   mouth rinse  15 mL Mouth Rinse q12n4p   multivitamin with minerals  1 tablet Oral Daily   oseltamivir  75 mg Oral BID   pantoprazole  40 mg Oral Daily   polyethylene glycol  17 g Oral Daily   potassium chloride  40 mEq Oral Once   predniSONE  20 mg Oral Q breakfast   Continuous Infusions:   LOS: 3 days   Time spent= 35 mins    Irma Delancey Arsenio Loader, MD [facility]  If 7PM-7AM, please contact  night-coverage  05/20/2021, 10:20 AM

## 2021-05-20 NOTE — Progress Notes (Signed)
  Patient stated she didn't want to wear CPAP tonight. She said she will try tomorrow night

## 2021-05-21 DIAGNOSIS — J111 Influenza due to unidentified influenza virus with other respiratory manifestations: Secondary | ICD-10-CM | POA: Diagnosis not present

## 2021-05-21 LAB — BASIC METABOLIC PANEL
Anion gap: 9 (ref 5–15)
BUN: 16 mg/dL (ref 6–20)
CO2: 36 mmol/L — ABNORMAL HIGH (ref 22–32)
Calcium: 9.3 mg/dL (ref 8.9–10.3)
Chloride: 93 mmol/L — ABNORMAL LOW (ref 98–111)
Creatinine, Ser: 0.82 mg/dL (ref 0.44–1.00)
GFR, Estimated: >60 mL/min (ref >60)
Glucose, Bld: 128 mg/dL — ABNORMAL HIGH (ref 70–99)
Potassium: 3.8 mmol/L (ref 3.5–5.1)
Sodium: 138 mmol/L (ref 135–145)

## 2021-05-21 LAB — GLUCOSE, CAPILLARY
Glucose-Capillary: 123 mg/dL — ABNORMAL HIGH (ref 70–99)
Glucose-Capillary: 208 mg/dL — ABNORMAL HIGH (ref 70–99)
Glucose-Capillary: 149 mg/dL — ABNORMAL HIGH (ref 70–99)
Glucose-Capillary: 151 mg/dL — ABNORMAL HIGH (ref 70–99)

## 2021-05-21 MED ORDER — TORSEMIDE 20 MG PO TABS
40.0000 mg | ORAL_TABLET | Freq: Every day | ORAL | Status: DC
  Administered 2021-05-21 – 2021-05-25 (×5): 40 mg via ORAL
  Filled 2021-05-21 (×5): qty 2

## 2021-05-21 NOTE — Progress Notes (Signed)
PROGRESS NOTE    Cynthia Becker  YPP:[id number] DOB: 1970-06-25 DOA: 05/16/2021 PCP: [name], NP   Brief Narrative:  50 year old with history of HTN, PAH, diastolic CHF, DM2, chronic pain, OSA chronic pain, obesity started having cold type symptoms about 48 hours prior to admission.  She was advised to get an x-ray by her outpatient provider but she did not feel well enough to get it done.  She ended up coming to the hospital due to shortness of breath.  She was found to be flu a positive.  Hospital course also complicated by transaminitis.  Acute hepatitis panel was negative.  She was started on Tamiflu.  Due to right knee pain CT was done that showed moderate effusion, uric acid was elevated.  Seen by orthopedic steroid/lidocaine. Foley was placed due to retention.    Assessment & Plan:   Principal Problem:   Flu Active Problems:   Influenza A   Unilateral primary osteoarthritis, right knee  Acute respiratory distress secondary to influenza A pneumonia Mild diastolic congestive heart failure exacerbation, class III. - 4l [place] this morning >92%  - Complete Tamiflu course - Bronchodilators scheduled and as needed, I-S/flutter - Supplemental oxygen as necessary, supplemental oxygen - BNP 19, Pro-Cal 0.5 - Last echocardiogram April 2022-EF 65%.  No wall motion abnormality -Resume home torsemide 40mg  po daily.   Right knee pain with moderate effusion, improved - Knee x-ray shows degenerative disease. Right lower extremity Dopplers-prelim no DVT.  CT of the knee shows moderate knee effusion, osteoarthritis.  Elevated uric acid, started prednisone.  Seen by orthopedic, steroid and lidocaine injected 11/26.  Outptn pain medicine follow up.   Encephalopathy/somnolent; resolved - Suspect secondary to CO2 narcosis versus narcotic use.  She needs to use BiPAP as needed.  Due to her morbid obesity, BNP can be falsely low therefore was given lasix  IV. Reduce dose of oxycodone IR to 5 mg  at bedtime.  Discontinue bedtime trazodone, reduce gabapentin to 800 mg twice daily  Transaminitis; Improved.  - Suspect this could be related to hepatic congestion - RUQ ultrasound showed hepatic steatosis.  Acute hepatitis panel negative  Microcytic anemia - No obvious signs of blood loss, iron studies, TSH, B12 overall unremarkable.  DM2, insulin-dependent - A1c 6.9. BS better range.  - Sliding scale and Accu-Chek. Continue Lantus 25 units daily  Essential hypertension - Coreg, BiDil.  IV hydralazine and IV Lopressor as needed  Diastolic CHF, chronic.  EF 65% - Grossly does not appear to be in volume overload but will continue to monitor.  Home diuretics currently on hold.  Continue home Coreg and BiDil.  OSA Morbid obesity with BMI greater than 75 -Currently on off and on BiPAP but needs to continue using CPAP after this at qhs off and on refusing it.  PAH - Not on any home meds.  This is likely combination of multiple issues including morbid obesity  Acute urinary retention - Difficult In and out cath. Foley placed with the help of Urology, appreciate help.  Will remove foley today and perform voiding trail.   PT/OT today  DVT prophylaxis: Lovenox Code Status: Full  Family Communication: Husband is present at bedside Inpatient, maintain hospital stay due to severe pain in the right lower extremity, abnormal LFTs and respiratory distress.  Plan is to get PT/OT, maybe home in the next 24-48 hrs depending on her mobility from her pain.   Subjective: Pain and breathing is better, but she has not really been out  of bed yet.   Review of Systems Otherwise negative except as per HPI, including: General = no fevers, chills, dizziness,  fatigue HEENT/EYES = negative for loss of vision, double vision, blurred vision,  sore throa Cardiovascular= negative for chest pain, palpitation Respiratory/lungs= negative for shortness of breath, cough, wheezing; hemoptysis,   Gastrointestinal= negative for nausea, vomiting, abdominal pain Genitourinary= negative for Dysuria MSK = Negative for arthralgia, myalgias Neurology= Negative for headache, numbness, tingling  Psychiatry= Negative for suicidal and homocidal ideation Skin= Negative for Rash   Examination: Constitutional: Not in acute distress. 4L [place], morbid obesity.  Respiratory: Clear to auscultation bilaterally Cardiovascular: Normal sinus rhythm, no rubs Abdomen: Nontender nondistended good bowel sounds Musculoskeletal: No edema noted Skin: No rashes seen Neurologic: CN 2-12 grossly intact.  And nonfocal Psychiatric: Normal judgment and insight. Alert and oriented x 3. Normal mood.   Objective: Vitals:   05/21/21 0642 05/21/21 0722 05/21/21 0732 05/21/21 0810  BP: (!) 163/90     Pulse: 63     Resp: 16     Temp: 98.2 F (36.8 C)     TempSrc: Oral     SpO2: 98% 98% 95% 94%  Weight:      Height:        Intake/Output Summary (Last 24 hours) at 05/21/2021 1153 Last data filed at 05/21/2021 1015 Gross per 24 hour  Intake 480 ml  Output 4000 ml  Net -3520 ml   Filed Weights   05/16/21 1019  Weight: (!) 212 kg     Data Reviewed:   CBC: Recent Labs  Lab 05/14/21 1246 05/16/21 1100 05/16/21 1100 05/16/21 1610 05/17/21 0434 05/17/21 0819 05/18/21 0425 05/19/21 0308 05/20/21 0323  WBC 7.9 8.8   < > 8.6 9.8  --  9.8 5.8 6.7  NEUTROABS 5.9 6.7  --   --   --   --   --   --   --   HGB 15.8 15.4*   < > 15.7* 16.2*  --  17.3* 15.2* 15.6*  HCT 49.0* 47.8*   < > 48.9* 50.3* 48.9* 55.5* 48.9* 49.8*  MCV 77* 77.2*   < > 78.4* 76.6*  --  80.7 79.1* 78.8*  PLT 233 270   < > 246 234  --  PLATELET CLUMPS NOTED ON SMEAR, COUNT APPEARS DECREASED 234 245   < > = values in this interval not displayed.   Basic Metabolic Panel: Recent Labs  Lab 05/17/21 0434 05/18/21 0549 05/19/21 0308 05/20/21 0323 05/21/21 0337  NA 140 140 135 139 138  K 3.7 3.7 4.2 3.4* 3.8  CL 94* 96* 93* 91* 93*   CO2 36* 35* 36* 39* 36*  GLUCOSE 130* 154* 157* 135* 128*  BUN 24* 21* 16 16 16   CREATININE 0.77 1.01* 0.74 0.69 0.82  CALCIUM 8.4* 8.4* 8.6* 9.1 9.3   GFR: Estimated Creatinine Clearance: 152.5 mL/min (by C-G formula based on SCr of 0.82 mg/dL). Liver Function Tests: Recent Labs  Lab 05/16/21 1100 05/17/21 0434 05/18/21 0549 05/19/21 0308 05/20/21 0323  AST 146* 349* 314* 149* 71*  ALT 80* 248* 355* 269* 185*  ALKPHOS 75 71 62 60 58  BILITOT 1.0 0.6 0.8 0.8 0.9  PROT 8.7* 8.5* 7.6 7.4 7.6  ALBUMIN 3.6 3.5 3.0* 2.9* 3.2*   No results for input(s): LIPASE, AMYLASE in the last 168 hours. No results for input(s): AMMONIA in the last 168 hours. Coagulation Profile: No results for input(s): INR, PROTIME in the last 168 hours. Cardiac  Enzymes: No results for input(s): CKTOTAL, CKMB, CKMBINDEX, TROPONINI in the last 168 hours. BNP (last 3 results) No results for input(s): PROBNP in the last 8760 hours. HbA1C: No results for input(s): HGBA1C in the last 72 hours.  CBG: Recent Labs  Lab 05/20/21 0809 05/20/21 1208 05/20/21 1554 05/20/21 2235 05/21/21 0741  GLUCAP 136* 227* 160* 192* 123*   Lipid Profile: No results for input(s): CHOL, HDL, LDLCALC, TRIG, CHOLHDL, LDLDIRECT in the last 72 hours.  Thyroid Function Tests: No results for input(s): TSH, T4TOTAL, FREET4, T3FREE, THYROIDAB in the last 72 hours.  Anemia Panel: No results for input(s): VITAMINB12, FOLATE, FERRITIN, TIBC, IRON, RETICCTPCT in the last 72 hours.  Sepsis Labs: Recent Labs  Lab 05/17/21 0819  PROCALCITON 0.55    Recent Results (from the past 240 hour(s))  Resp Panel by RT-PCR (Flu A&B, Covid) Nasopharyngeal Swab     Status: Abnormal   Collection Time: 05/16/21 10:38 AM   Specimen: Nasopharyngeal Swab; Nasopharyngeal(NP) swabs in vial transport medium  Result Value Ref Range Status   SARS Coronavirus 2 by RT PCR NEGATIVE NEGATIVE Final    Comment: (NOTE) SARS-CoV-2 target nucleic acids  are NOT DETECTED.  The SARS-CoV-2 RNA is generally detectable in upper respiratory specimens during the acute phase of infection. The lowest concentration of SARS-CoV-2 viral copies this assay can detect is 138 copies/mL. A negative result does not preclude SARS-Cov-2 infection and should not be used as the sole basis for treatment or other patient management decisions. A negative result may occur with  improper specimen collection/handling, submission of specimen other than nasopharyngeal swab, presence of viral mutation(s) within the areas targeted by this assay, and inadequate number of viral copies(<138 copies/mL). A negative result must be combined with clinical observations, patient history, and epidemiological information. The expected result is Negative.  Fact Sheet for Patients:  EntrepreneurPulse.com.au  Fact Sheet for Healthcare Providers:  IncredibleEmployment.be  This test is no t yet approved or cleared by the [place] FDA and  has been authorized for detection and/or diagnosis of SARS-CoV-2 by FDA under an Emergency Use Authorization (EUA). This EUA will remain  in effect (meaning this test can be used) for the duration of the COVID-19 declaration under Section 564(b)(1) of the Act, 21 U.S.C.section 360bbb-3(b)(1), unless the authorization is terminated  or revoked sooner.       Influenza A by PCR POSITIVE (A) NEGATIVE Final   Influenza B by PCR NEGATIVE NEGATIVE Final    Comment: (NOTE) The Xpert Xpress SARS-CoV-2/FLU/RSV plus assay is intended as an aid in the diagnosis of influenza from Nasopharyngeal swab specimens and should not be used as a sole basis for treatment. Nasal washings and aspirates are unacceptable for Xpert Xpress SARS-CoV-2/FLU/RSV testing.  Fact Sheet for Patients: EntrepreneurPulse.com.au  Fact Sheet for Healthcare Providers: IncredibleEmployment.be  This test  is not yet approved or cleared by the [place] FDA and has been authorized for detection and/or diagnosis of SARS-CoV-2 by FDA under an Emergency Use Authorization (EUA). This EUA will remain in effect (meaning this test can be used) for the duration of the COVID-19 declaration under Section 564(b)(1) of the Act, 21 U.S.C. section 360bbb-3(b)(1), unless the authorization is terminated or revoked.  Performed at [facility], [place] [address], [place], [place] [address]   MRSA Next Gen by PCR, Nasal     Status: None   Collection Time: 05/18/21  4:55 AM   Specimen: Nasal Mucosa; Nasal Swab  Result Value Ref Range Status  MRSA by PCR Next Gen NOT DETECTED NOT DETECTED Final    Comment: (NOTE) The GeneXpert MRSA Assay (FDA approved for NASAL specimens only), is one component of a comprehensive MRSA colonization surveillance program. It is not intended to diagnose MRSA infection nor to guide or monitor treatment for MRSA infections. Test performance is not FDA approved in patients less than 45 years old. Performed at [facility], [place] [address] [place], [place] [address]          Radiology Studies: No results found.      Scheduled Meds:  (feeding supplement) PROSource Plus  30 mL Oral BID BM   carvedilol  3.125 mg Oral BID WC   chlorhexidine  15 mL Mouth Rinse BID   Chlorhexidine Gluconate Cloth  6 each Topical Daily   docusate sodium  100 mg Oral BID   enoxaparin (LOVENOX) injection  40 mg Subcutaneous Q12H   feeding supplement (GLUCERNA SHAKE)  237 mL Oral BID BM   gabapentin  800 mg Oral BID   guaiFENesin  600 mg Oral BID   insulin aspart  0-15 Units Subcutaneous TID WC   insulin aspart  0-5 Units Subcutaneous QHS   insulin glargine-yfgn  25 Units Subcutaneous QHS   ipratropium-albuterol  3 mL Nebulization TID   isosorbide-hydrALAZINE  1 tablet Oral TID   linaclotide  290 mcg Oral QAC breakfast   loratadine  10 mg Oral  Daily   mouth rinse  15 mL Mouth Rinse q12n4p   multivitamin with minerals  1 tablet Oral Daily   pantoprazole  40 mg Oral Daily   polyethylene glycol  17 g Oral Daily   predniSONE  20 mg Oral Q breakfast   torsemide  40 mg Oral Daily   Continuous Infusions:   LOS: 4 days   Time spent= 35 mins    Anthonny Schiller Arsenio Loader, MD [facility]  If 7PM-7AM, please contact night-coverage  05/21/2021, 11:53 AM

## 2021-05-21 NOTE — Progress Notes (Signed)
Pt refused CPAP qhs.  Pt encouraged to contact RT should she change her mind.  

## 2021-05-21 NOTE — Evaluation (Signed)
Physical Therapy Evaluation Patient Details Name: Cynthia Becker MRN: [id number] DOB: 1970-12-30 Today's Date: 05/21/2021  History of Present Illness  50 year old with history of HTN, PAH, diastolic CHF, DM2, chronic pain, OSA chronic pain, obesity started having cold type symptoms about 48 hours prior to admission, admitted with SOB + for  flu. Due to right knee pain CT was done that showed moderate effusion, uric acid was elevated.  Seen by orthopedics for R knee steroid injection.  Clinical Impression  Pt admitted with above diagnosis.  Pt dizzy with EOB (has not been up in 5 days), BP stable. Pt able to transfer <> BSC <> chair with min assist of 2 for  balance and safety.  Pt is motivated and wanst to d/c home, recommend HHPT . Continue PT in acute setting   Pt currently with functional limitations due to the deficits listed below (see PT Problem List). Pt will benefit from skilled PT to increase their independence and safety with mobility to allow discharge to the venue listed below.          Recommendations for follow up therapy are one component of a multi-disciplinary discharge planning process, led by the attending physician.  Recommendations may be updated based on patient status, additional functional criteria and insurance authorization.  Follow Up Recommendations Home health PT    Assistance Recommended at Discharge Intermittent Supervision/Assistance  Functional Status Assessment Patient has had a recent decline in their functional status and demonstrates the ability to make significant improvements in function in a reasonable and predictable amount of time.  Equipment Recommendations  Other (comment)    Recommendations for Other Services       Precautions / Restrictions Precautions Precautions: Fall Restrictions Weight Bearing Restrictions: No      Mobility  Bed Mobility Overal bed mobility: Needs Assistance Bed Mobility: Supine to Sit     Supine to sit: Min  assist;+2 for physical assistance;+2 for safety/equipment;HOB elevated     General bed mobility comments: assist to bring LEs fully off and to elevate trunk,reliant on bed rail/able to self assist with UEs. dizzy on initial sitting, BP 155/95    Transfers Overall transfer level: Needs assistance Equipment used: Rolling walker (2 wheels) Transfers: Sit to/from Stand;Bed to chair/wheelchair/BSC Sit to Stand: Min assist;+2 physical assistance;+2 safety/equipment Stand pivot transfers: Min assist;+2 physical assistance;+2 safety/equipment Step pivot transfers: Min assist;+2 safety/equipment;+2 physical assistance       General transfer comment: +2 to balance and steady self on rising, light assist with anterior -superior wt shift. assist to balance and maneuver RW to  take small steps to pivot to [facility]    Ambulation/Gait               General Gait Details: deferred today d/t dizziness with transfers  Stairs            Wheelchair Mobility    Modified Rankin (Stroke Patients Only)       Balance Overall balance assessment: Needs assistance Sitting-balance support: No upper extremity supported;Feet unsupported Sitting balance-Leahy Scale: Fair     Standing balance support: Reliant on assistive device for balance;During functional activity Standing balance-Leahy Scale: Poor                               Pertinent Vitals/Pain Pain Assessment: Faces Faces Pain Scale: Hurts even more Pain Location: knees R>L Pain Descriptors / Indicators: Grimacing;Sore Pain Intervention(s): Limited activity within patient's tolerance;Monitored during session;Premedicated before  session;Repositioned    Home Living Family/patient expects to be discharged to:: Private residence Living Arrangements: Spouse/significant other Available Help at Discharge: Family;Available PRN/intermittently Type of Home: House Home Access: Ramped entrance       Home Layout: One  level Home Equipment: Wheelchair - power Additional Comments: 2L O2 mostly at night    Prior Function Prior Level of Function : Needs assist             Mobility Comments: When not painful, pt typically mod indep with household mobility via furniture/wall surfing, uses electric scooter in kitchen and for community distances; at times requires assist from husband to stand or get OOB when painful ADLs Comments: Husband assists as needed for ADL/iADLs     Hand Dominance        Extremity/Trunk Assessment   Upper Extremity Assessment Upper Extremity Assessment: Overall WFL for tasks assessed    Lower Extremity Assessment Lower Extremity Assessment: Generalized weakness;RLE deficits/detail;LLE deficits/detail RLE Deficits / Details: ROM limited d/t body habitus and pain; strength grossly 3/5 LLE Deficits / Details: ROM limited d/t body habitus and pain. strength grossly 3/5       Communication   Communication: No difficulties  Cognition Arousal/Alertness: Awake/alert Behavior During Therapy: WFL for tasks assessed/performed Overall Cognitive Status: Within Functional Limits for tasks assessed                                          General Comments      Exercises     Assessment/Plan    PT Assessment Patient needs continued PT services  PT Problem List Decreased strength;Decreased range of motion;Decreased activity tolerance;Decreased mobility;Pain;Decreased balance       PT Treatment Interventions DME instruction;Therapeutic activities;Functional mobility training;Therapeutic exercise;Patient/family education;Gait training    PT Goals (Current goals can be found in the Care Plan section)  Acute Rehab PT Goals Patient Stated Goal: home PT Goal Formulation: With patient Time For Goal Achievement: 06/04/21 Potential to Achieve Goals: Good    Frequency 7X/week   Barriers to discharge        Co-evaluation               AM-PAC PT "6  Clicks" Mobility  Outcome Measure Help needed turning from your back to your side while in a flat bed without using bedrails?: Total Help needed moving from lying on your back to sitting on the side of a flat bed without using bedrails?: Total Help needed moving to and from a bed to a chair (including a wheelchair)?: Total Help needed standing up from a chair using your arms (e.g., wheelchair or bedside chair)?: Total Help needed to walk in hospital room?: Total Help needed climbing 3-5 steps with a railing? : Total 6 Click Score: 6    End of Session Equipment Utilized During Treatment: Gait belt Activity Tolerance: Patient tolerated treatment well Patient left: with call bell/phone within reach;in chair;with family/visitor present Nurse Communication: Mobility status PT Visit Diagnosis: Other abnormalities of gait and mobility (R26.89);Difficulty in walking, not elsewhere classified (R26.2)    Time: 4944-9675 PT Time Calculation (min) (ACUTE ONLY): 48 min   Charges:   PT Evaluation $PT Eval Low Complexity: 1 Low PT Treatments $Therapeutic Activity: 23-37 mins        Baxter Flattery, PT  Acute Rehab Dept ([place]) [phone] Pager [phone]  05/21/2021   [facility] 05/21/2021, 3:13 PM

## 2021-05-22 LAB — GLUCOSE, CAPILLARY
Glucose-Capillary: 179 mg/dL — ABNORMAL HIGH (ref 70–99)
Glucose-Capillary: 146 mg/dL — ABNORMAL HIGH (ref 70–99)
Glucose-Capillary: 192 mg/dL — ABNORMAL HIGH (ref 70–99)
Glucose-Capillary: 150 mg/dL — ABNORMAL HIGH (ref 70–99)

## 2021-05-22 LAB — BASIC METABOLIC PANEL
Anion gap: 8 (ref 5–15)
BUN: 19 mg/dL (ref 6–20)
CO2: 32 mmol/L (ref 22–32)
Calcium: 9.2 mg/dL (ref 8.9–10.3)
Chloride: 95 mmol/L — ABNORMAL LOW (ref 98–111)
Creatinine, Ser: 0.84 mg/dL (ref 0.44–1.00)
GFR, Estimated: >60 mL/min (ref >60)
Glucose, Bld: 120 mg/dL — ABNORMAL HIGH (ref 70–99)
Potassium: 3.6 mmol/L (ref 3.5–5.1)
Sodium: 135 mmol/L (ref 135–145)

## 2021-05-22 NOTE — Progress Notes (Signed)
Physical Therapy Treatment Patient Details Name: Cynthia Becker MRN: [id number] DOB: Oct 21, 1970 Today's Date: 05/22/2021   History of Present Illness 50 year old with history of HTN, PAH, diastolic CHF, DM2, chronic pain, OSA chronic pain, obesity started having cold type symptoms about 48 hours prior to admission, admitted with SOB + for  flu. Due to right knee pain CT was done that showed moderate effusion, uric acid was elevated.  Seen by orthopedics for R knee steroid injection.    PT Comments    Pt progressing, able to amb short distance in room, however limited by bil knee pain. Attempted various positions in chair to decr pain without success, pt agreed to go back to bed, repositioned with minimal reduction in pain. RN notified.  Pt is cooperative and motivated and willing to work within limits of pain.   Recommendations for follow up therapy are one component of a multi-disciplinary discharge planning process, led by the attending physician.  Recommendations may be updated based on patient status, additional functional criteria and insurance authorization.  Follow Up Recommendations  Home health PT     Assistance Recommended at Discharge Intermittent Supervision/Assistance  Equipment Recommendations  Other (comment) (TBD)    Recommendations for Other Services       Precautions / Restrictions Precautions Precautions: Fall Restrictions Weight Bearing Restrictions: No     Mobility  Bed Mobility Overal bed mobility: Needs Assistance Bed Mobility: Sit to Supine       Sit to supine: Min assist   General bed mobility comments: assist to complete brining LEs on to bed    Transfers Overall transfer level: Needs assistance Equipment used: Rolling walker (2 wheels) Transfers: Sit to/from Stand;Bed to chair/wheelchair/BSC Sit to Stand: Min assist;+2 physical assistance;+2 safety/equipment     Step pivot transfers: Min assist;+2 safety/equipment;+2 physical assistance      General transfer comment: +2 to balance and steady self on rising, light assist with anterior -superior wt shift. assist to balance and maneuver RW to  take small steps chair to bed    Ambulation/Gait Ambulation/Gait assistance: Min assist;+2 safety/equipment Gait Distance (Feet): 9 Feet Assistive device: Rolling walker (2 wheels) Gait Pattern/deviations: Step-to pattern;Decreased step length - right;Decreased step length - left;Wide base of support       General Gait Details: cues for posture and breathing. limited by bil knee pain. SpO2=86-87% on RA, replaced O2 at rest at  2L and pt with sats in 90s   Stairs             Wheelchair Mobility    Modified Rankin (Stroke Patients Only)       Balance                                            Cognition Arousal/Alertness: Awake/alert Behavior During Therapy: WFL for tasks assessed/performed Overall Cognitive Status: Within Functional Limits for tasks assessed                                          Exercises General Exercises - Lower Extremity Ankle Circles/Pumps: AROM;Both;10 reps Quad Sets:  (instructed in, pt report she has been trying to do on her own, too painful currently)    General Comments        Pertinent Vitals/Pain Pain Assessment: 0-10 Pain Score:  10-Worst pain ever Pain Location: knees R>L Pain Descriptors / Indicators: Aching;Constant;Crying;Grimacing Pain Intervention(s): Limited activity within patient's tolerance;Monitored during session;Premedicated before session;Repositioned    Home Living                          Prior Function            PT Goals (current goals can now be found in the care plan section) Acute Rehab PT Goals Patient Stated Goal: home PT Goal Formulation: With patient Time For Goal Achievement: 06/04/21 Potential to Achieve Goals: Good Progress towards PT goals: Progressing toward goals    Frequency     7X/week      PT Plan Current plan remains appropriate    Co-evaluation              AM-PAC PT "6 Clicks" Mobility   Outcome Measure  Help needed turning from your back to your side while in a flat bed without using bedrails?: A Lot Help needed moving from lying on your back to sitting on the side of a flat bed without using bedrails?: A Lot Help needed moving to and from a bed to a chair (including a wheelchair)?: A Lot Help needed standing up from a chair using your arms (e.g., wheelchair or bedside chair)?: A Lot Help needed to walk in hospital room?: A Lot Help needed climbing 3-5 steps with a railing? : Total 6 Click Score: 11    End of Session Equipment Utilized During Treatment: Gait belt Activity Tolerance: Patient tolerated treatment well;Patient limited by pain Patient left: in bed;with call bell/phone within reach;with family/visitor present Nurse Communication: Mobility status PT Visit Diagnosis: Other abnormalities of gait and mobility (R26.89);Difficulty in walking, not elsewhere classified (R26.2)     Time: 5053-9767 PT Time Calculation (min) (ACUTE ONLY): 42 min  Charges:  $Gait Training: 8-22 mins $Therapeutic Activity: 23-37 mins                     Baxter Flattery, PT  Acute Rehab Dept ([place]) [phone] Pager [phone]  05/22/2021    [facility] 05/22/2021, 12:46 PM

## 2021-05-22 NOTE — Progress Notes (Signed)
Pt continues to decline nocturnal cpap.  Pt was advised that RT is available all night should she change her mind.

## 2021-05-22 NOTE — Plan of Care (Signed)
Plan of care reviewed and discussed with the patient. 

## 2021-05-23 DIAGNOSIS — J101 Influenza due to other identified influenza virus with other respiratory manifestations: Secondary | ICD-10-CM | POA: Diagnosis not present

## 2021-05-23 DIAGNOSIS — J111 Influenza due to unidentified influenza virus with other respiratory manifestations: Secondary | ICD-10-CM | POA: Diagnosis not present

## 2021-05-23 DIAGNOSIS — R0602 Shortness of breath: Secondary | ICD-10-CM | POA: Diagnosis not present

## 2021-05-23 LAB — BASIC METABOLIC PANEL
Anion gap: 7 (ref 5–15)
BUN: 20 mg/dL (ref 6–20)
CO2: 34 mmol/L — ABNORMAL HIGH (ref 22–32)
Calcium: 8.8 mg/dL — ABNORMAL LOW (ref 8.9–10.3)
Chloride: 93 mmol/L — ABNORMAL LOW (ref 98–111)
Creatinine, Ser: 0.92 mg/dL (ref 0.44–1.00)
GFR, Estimated: >60 mL/min (ref >60)
Glucose, Bld: 110 mg/dL — ABNORMAL HIGH (ref 70–99)
Potassium: 3.5 mmol/L (ref 3.5–5.1)
Sodium: 134 mmol/L — ABNORMAL LOW (ref 135–145)

## 2021-05-23 LAB — GLUCOSE, CAPILLARY
Glucose-Capillary: 126 mg/dL — ABNORMAL HIGH (ref 70–99)
Glucose-Capillary: 179 mg/dL — ABNORMAL HIGH (ref 70–99)
Glucose-Capillary: 177 mg/dL — ABNORMAL HIGH (ref 70–99)
Glucose-Capillary: 215 mg/dL — ABNORMAL HIGH (ref 70–99)

## 2021-05-23 MED ORDER — IPRATROPIUM-ALBUTEROL 0.5-2.5 (3) MG/3ML IN SOLN
3.0000 mL | Freq: Four times a day (QID) | RESPIRATORY_TRACT | Status: DC
  Administered 2021-05-23 (×2): 3 mL via RESPIRATORY_TRACT
  Filled 2021-05-23 (×2): qty 3

## 2021-05-23 MED ORDER — IPRATROPIUM-ALBUTEROL 0.5-2.5 (3) MG/3ML IN SOLN: 3.0000 mL | RESPIRATORY_TRACT | Status: DC | PRN

## 2021-05-23 MED ORDER — PREDNISONE 20 MG PO TABS
40.0000 mg | ORAL_TABLET | Freq: Every day | ORAL | Status: DC
  Administered 2021-05-23 – 2021-05-25 (×3): 40 mg via ORAL
  Filled 2021-05-23 (×3): qty 2

## 2021-05-23 MED ORDER — IPRATROPIUM-ALBUTEROL 0.5-2.5 (3) MG/3ML IN SOLN
3.0000 mL | Freq: Two times a day (BID) | RESPIRATORY_TRACT | Status: DC
Start: 2021-05-24 — End: 2021-05-25
  Administered 2021-05-24 – 2021-05-25 (×3): 3 mL via RESPIRATORY_TRACT
  Filled 2021-05-23 (×3): qty 3

## 2021-05-23 NOTE — Progress Notes (Signed)
PROGRESS NOTE    Cynthia Becker  FFM:[id number] DOB: 1971/01/22 DOA: 05/16/2021 PCP: [name], NP    Brief Narrative:  50 year old with history of HTN, PAH, diastolic CHF, DM2, chronic pain, OSA chronic pain, obesity started having cold type symptoms about 48 hours prior to admission.  She was advised to get an x-ray by her outpatient provider but she did not feel well enough to get it done.  She ended up coming to the hospital due to shortness of breath.  She was found to be flu a positive.  Hospital course also complicated by transaminitis.  Acute hepatitis panel was negative.  She was started on Tamiflu.  Due to right knee pain CT was done that showed moderate effusion, uric acid was elevated.  Seen by orthopedic steroid/lidocaine. Foley was placed due to retention  Assessment & Plan:   Principal Problem:   Flu Active Problems:   Influenza A   Unilateral primary osteoarthritis, right knee  Acute respiratory distress secondary to influenza A pneumonia Mild diastolic congestive heart failure exacerbation, class III. - Remains 4l Page this morning  - Complete Tamiflu course - Bronchodilators scheduled and as needed, I-S/flutter - Supplemental oxygen as necessary, supplemental oxygen - BNP 19, Pro-Cal 0.5 - Last echocardiogram April 2022-EF 65%.  No wall motion abnormality -Resume home torsemide 40mg  po daily.    Right knee pain with moderate effusion, improved - Knee x-ray shows degenerative disease. Right lower extremity Dopplers-prelim no DVT.  CT of the knee shows moderate knee effusion, osteoarthritis.  Elevated uric acid, started prednisone.  Seen by orthopedic, steroid and lidocaine injected 11/26.  -Recommend outptn pain medicine follow up.    Encephalopathy/somnolent; resolved - Suspect secondary to CO2 narcosis versus narcotic use. Recommend BiPAP as needed.  Due to her morbid obesity, BNP can be falsely low therefore was given lasix  IV. Reduced dose of oxycodone IR to  5 mg at bedtime.  Discontinued bedtime trazodone, reduced gabapentin to 800 mg twice daily -Pt has been refusing cpap at night with O2 increased overnight, but successfully weaned to baseline 2L during the day   Transaminitis; Improved.  - Suspect this could be related to hepatic congestion - RUQ ultrasound showed hepatic steatosis.   -Acute hepatitis panel noted to be negative  Microcytic anemia ruled out No evidence of acute blood loss, iron studies, TSH, B12 overall unremarkable. - hgb of 15.6, not anemic   DM2, insulin-dependent - A1c 6.9. BS better range.  - Sliding scale and Accu-Chek. Continue Lantus 25 units daily  Essential hypertension - Coreg, BiDil.  IV hydralazine and IV Lopressor as needed -BP stable and controlled   Diastolic CHF, chronic.  EF 65% - Grossly does not appear to be in volume overload but will continue to monitor.  Home diuretics currently on hold.   -Continue home Coreg and BiDil  OSA Morbid obesity with BMI greater than 75 -Currently on off and on BiPAP but needs to continue using CPAP after this at qhs off and on refusing it.  PAH - Not on any home meds.  This is likely combination of multiple issues including morbid obesity -stable, LE edema improved   Acute urinary retention - Difficult In and out cath. Foley placed with the help of Urology, appreciate help.   -Foley has been removed. Pt documented to be voiding   DVT prophylaxis: Lovenox subq Code Status: Full Family Communication: Pt in room, family at bedside  Status is: Inpatient  Remains inpatient appropriate because: Still requiring  O2 above baseline requirements this AM    Consultants:    Procedures:    Antimicrobials: Anti-infectives (From admission, onward)    Start     Dose/Rate Route Frequency Ordered Stop   05/16/21 2200  oseltamivir (TAMIFLU) capsule 75 mg        75 mg Oral 2 times daily 05/16/21 1609 05/21/21 1010   05/16/21 1230  oseltamivir (TAMIFLU) capsule 75  mg        75 mg Oral  Once 05/16/21 1218 05/16/21 1242       Subjective: Reports feeling worn out, having discomfort in chest associated with coughing  Objective: Vitals:   05/22/21 2045 05/23/21 0552 05/23/21 1402 05/23/21 1452  BP: 119/68 124/73 126/82   Pulse: 79 77 88   Resp: 16 16 18    Temp: 98.4 F (36.9 C) 98.1 F (36.7 C) 98.1 F (36.7 C)   TempSrc: Oral Oral Oral   SpO2: 95% 92% 94% 98%  Weight:      Height:        Intake/Output Summary (Last 24 hours) at 05/23/2021 1601 Last data filed at 05/23/2021 0200 Gross per 24 hour  Intake 840 ml  Output --  Net 840 ml   Filed Weights   05/16/21 1019  Weight: (!) 212 kg    Examination: General exam: Awake, laying in bed, in nad Respiratory system: Normal respiratory effort, end-expiratory wheezing Cardiovascular system: regular rate, s1, s2 Gastrointestinal system: Soft, nondistended, positive BS Central nervous system: CN2-12 grossly intact, strength intact Extremities: Perfused, no clubbing Skin: Normal skin turgor, no notable skin lesions seen Psychiatry: Mood normal // no visual hallucinations   Data Reviewed: I have personally reviewed following labs and imaging studies  CBC: Recent Labs  Lab 05/16/21 1610 05/17/21 0434 05/17/21 0819 05/18/21 0425 05/19/21 0308 05/20/21 0323  WBC 8.6 9.8  --  9.8 5.8 6.7  HGB 15.7* 16.2*  --  17.3* 15.2* 15.6*  HCT 48.9* 50.3* 48.9* 55.5* 48.9* 49.8*  MCV 78.4* 76.6*  --  80.7 79.1* 78.8*  PLT 246 234  --  PLATELET CLUMPS NOTED ON SMEAR, COUNT APPEARS DECREASED 234 301   Basic Metabolic Panel: Recent Labs  Lab 05/19/21 0308 05/20/21 0323 05/21/21 0337 05/22/21 0335 05/23/21 0332  NA 135 139 138 135 134*  K 4.2 3.4* 3.8 3.6 3.5  CL 93* 91* 93* 95* 93*  CO2 36* 39* 36* 32 34*  GLUCOSE 157* 135* 128* 120* 110*  BUN 16 16 16 19 20   CREATININE 0.74 0.69 0.82 0.84 0.92  CALCIUM 8.6* 9.1 9.3 9.2 8.8*   GFR: Estimated Creatinine Clearance: 135.9 mL/min  (by C-G formula based on SCr of 0.92 mg/dL). Liver Function Tests: Recent Labs  Lab 05/17/21 0434 05/18/21 0549 05/19/21 0308 05/20/21 0323  AST 349* 314* 149* 71*  ALT 248* 355* 269* 185*  ALKPHOS 71 62 60 58  BILITOT 0.6 0.8 0.8 0.9  PROT 8.5* 7.6 7.4 7.6  ALBUMIN 3.5 3.0* 2.9* 3.2*   No results for input(s): LIPASE, AMYLASE in the last 168 hours. No results for input(s): AMMONIA in the last 168 hours. Coagulation Profile: No results for input(s): INR, PROTIME in the last 168 hours. Cardiac Enzymes: No results for input(s): CKTOTAL, CKMB, CKMBINDEX, TROPONINI in the last 168 hours. BNP (last 3 results) No results for input(s): PROBNP in the last 8760 hours. HbA1C: No results for input(s): HGBA1C in the last 72 hours. CBG: Recent Labs  Lab 05/22/21 1214 05/22/21 1639 05/22/21 2150 05/23/21 6010  05/23/21 1130  GLUCAP 146* 192* 150* 126* 179*   Lipid Profile: No results for input(s): CHOL, HDL, LDLCALC, TRIG, CHOLHDL, LDLDIRECT in the last 72 hours. Thyroid Function Tests: No results for input(s): TSH, T4TOTAL, FREET4, T3FREE, THYROIDAB in the last 72 hours. Anemia Panel: No results for input(s): VITAMINB12, FOLATE, FERRITIN, TIBC, IRON, RETICCTPCT in the last 72 hours. Sepsis Labs: Recent Labs  Lab 05/17/21 0819  PROCALCITON 0.55    Recent Results (from the past 240 hour(s))  Resp Panel by RT-PCR (Flu A&B, Covid) Nasopharyngeal Swab     Status: Abnormal   Collection Time: 05/16/21 10:38 AM   Specimen: Nasopharyngeal Swab; Nasopharyngeal(NP) swabs in vial transport medium  Result Value Ref Range Status   SARS Coronavirus 2 by RT PCR NEGATIVE NEGATIVE Final    Comment: (NOTE) SARS-CoV-2 target nucleic acids are NOT DETECTED.  The SARS-CoV-2 RNA is generally detectable in upper respiratory specimens during the acute phase of infection. The lowest concentration of SARS-CoV-2 viral copies this assay can detect is 138 copies/mL. A negative result does not  preclude SARS-Cov-2 infection and should not be used as the sole basis for treatment or other patient management decisions. A negative result may occur with  improper specimen collection/handling, submission of specimen other than nasopharyngeal swab, presence of viral mutation(s) within the areas targeted by this assay, and inadequate number of viral copies(<138 copies/mL). A negative result must be combined with clinical observations, patient history, and epidemiological information. The expected result is Negative.  Fact Sheet for Patients:  EntrepreneurPulse.com.au  Fact Sheet for Healthcare Providers:  IncredibleEmployment.be  This test is no t yet approved or cleared by the [place] FDA and  has been authorized for detection and/or diagnosis of SARS-CoV-2 by FDA under an Emergency Use Authorization (EUA). This EUA will remain  in effect (meaning this test can be used) for the duration of the COVID-19 declaration under Section 564(b)(1) of the Act, 21 U.S.C.section 360bbb-3(b)(1), unless the authorization is terminated  or revoked sooner.       Influenza A by PCR POSITIVE (A) NEGATIVE Final   Influenza B by PCR NEGATIVE NEGATIVE Final    Comment: (NOTE) The Xpert Xpress SARS-CoV-2/FLU/RSV plus assay is intended as an aid in the diagnosis of influenza from Nasopharyngeal swab specimens and should not be used as a sole basis for treatment. Nasal washings and aspirates are unacceptable for Xpert Xpress SARS-CoV-2/FLU/RSV testing.  Fact Sheet for Patients: EntrepreneurPulse.com.au  Fact Sheet for Healthcare Providers: IncredibleEmployment.be  This test is not yet approved or cleared by the [place] FDA and has been authorized for detection and/or diagnosis of SARS-CoV-2 by FDA under an Emergency Use Authorization (EUA). This EUA will remain in effect (meaning this test can be used) for the  duration of the COVID-19 declaration under Section 564(b)(1) of the Act, 21 U.S.C. section 360bbb-3(b)(1), unless the authorization is terminated or revoked.  Performed at [facility], [place] [address] [place], [place] [address]   MRSA Next Gen by PCR, Nasal     Status: None   Collection Time: 05/18/21  4:55 AM   Specimen: Nasal Mucosa; Nasal Swab  Result Value Ref Range Status   MRSA by PCR Next Gen NOT DETECTED NOT DETECTED Final    Comment: (NOTE) The GeneXpert MRSA Assay (FDA approved for NASAL specimens only), is one component of a comprehensive MRSA colonization surveillance program. It is not intended to diagnose MRSA infection nor to guide or monitor treatment for MRSA infections. Test performance  is not FDA approved in patients less than 11 years old. Performed at [facility], [place] [address], [place], [place] [address]      Radiology Studies: No results found.  Scheduled Meds:  (feeding supplement) PROSource Plus  30 mL Oral BID BM   carvedilol  3.125 mg Oral BID WC   chlorhexidine  15 mL Mouth Rinse BID   Chlorhexidine Gluconate Cloth  6 each Topical Daily   docusate sodium  100 mg Oral BID   enoxaparin (LOVENOX) injection  40 mg Subcutaneous Q12H   feeding supplement (GLUCERNA SHAKE)  237 mL Oral BID BM   gabapentin  800 mg Oral BID   guaiFENesin  600 mg Oral BID   insulin aspart  0-15 Units Subcutaneous TID WC   insulin aspart  0-5 Units Subcutaneous QHS   insulin glargine-yfgn  25 Units Subcutaneous QHS   ipratropium-albuterol  3 mL Nebulization Q6H   isosorbide-hydrALAZINE  1 tablet Oral TID   linaclotide  290 mcg Oral QAC breakfast   loratadine  10 mg Oral Daily   mouth rinse  15 mL Mouth Rinse q12n4p   multivitamin with minerals  1 tablet Oral Daily   pantoprazole  40 mg Oral Daily   polyethylene glycol  17 g Oral Daily   predniSONE  40 mg Oral Q breakfast   torsemide  40 mg Oral Daily   Continuous  Infusions:   LOS: 6 days   Marylu Lund, MD [facility] Pager On Amion  If 7PM-7AM, please contact night-coverage 05/23/2021, 4:01 PM

## 2021-05-23 NOTE — Evaluation (Signed)
Occupational Therapy Evaluation Patient Details Name: Cynthia Becker MRN: [id number] DOB: [birth year] Today's Date: 05/23/2021   History of Present Illness 50 year old with history of HTN, PAH, diastolic CHF, DM2, chronic pain, OSA chronic pain, obesity started having cold type symptoms about 48 hours prior to admission, admitted with SOB + for  flu. Due to right knee pain CT was done that showed moderate effusion, uric acid was elevated.  Seen by orthopedics for R knee steroid injection.   Clinical Impression   Patient is a 50 year old female who was noted to have a decline in function. Patient was noted to have poor activity tolerance with increased need for supplemental O2 impacting participation in ADLs. Patietn is max A for Lb dressing and bathing tasks at this time. Patients body habitus, decreased activity tolerance, decreased endurance, and increased pain with movement impact participation in ADLs. Patient plans to transition home with family support and [place] services at time of d/c. Patient would continue to benefit from skilled OT services at this time while admitted and after d/c to address noted deficits in order to improve overall safety and independence in ADLs.        Recommendations for follow up therapy are one component of a multi-disciplinary discharge planning process, led by the attending physician.  Recommendations may be updated based on patient status, additional functional criteria and insurance authorization.   Follow Up Recommendations  Home health OT    Assistance Recommended at Discharge Frequent or constant Supervision/Assistance  Functional Status Assessment  Patient has had a recent decline in their functional status and demonstrates the ability to make significant improvements in function in a reasonable and predictable amount of time.  Equipment Recommendations  None recommended by OT    Recommendations for Other Services       Precautions / Restrictions  Precautions Precautions: Fall Precaution Comments: monitor O2 Restrictions Weight Bearing Restrictions: No      Mobility Bed Mobility               General bed mobility comments: patient was sititng on edge of bed at start of session with NT    Transfers Overall transfer level: Needs assistance Equipment used: Rolling walker (2 wheels) Transfers: Sit to/from Stand;Bed to chair/wheelchair/BSC Sit to Stand: Min assist Stand pivot transfers: Min assist         General transfer comment: with increased time and education to keep RW close during transfers.      Balance Overall balance assessment: Needs assistance Sitting-balance support: No upper extremity supported;Feet unsupported Sitting balance-Leahy Scale: Fair     Standing balance support: Reliant on assistive device for balance;During functional activity Standing balance-Leahy Scale: Poor                             ADL either performed or assessed with clinical judgement   ADL Overall ADL's : Needs assistance/impaired Eating/Feeding: Set up;Sitting   Grooming: Oral care;Wash/dry face;Sitting Grooming Details (indicate cue type and reason): EOB Upper Body Bathing: Set up;Sitting Upper Body Bathing Details (indicate cue type and reason): EOB Lower Body Bathing: Maximal assistance;Sitting/lateral leans;Sit to/from stand Lower Body Bathing Details (indicate cue type and reason): body habitus, has help from husband at home Upper Body Dressing : Minimal assistance;Sitting   Lower Body Dressing: Maximal assistance;Sit to/from stand;Sitting/lateral leans   Toilet Transfer: Minimal assistance;BSC/3in1;Ambulation;Rolling walker (2 wheels)   Toileting- Clothing Manipulation and Hygiene: Maximal assistance;Sit to/from stand Electrical engineer  Details (indicate cue type and reason): patient reported having attempted to use toileting wand prior but it not working well for her. husband assists  at home             Vision Patient Visual Report: No change from baseline       Perception     Praxis      Pertinent Vitals/Pain Pain Assessment: Faces Faces Pain Scale: Hurts even more Pain Location: knees R>L Pain Descriptors / Indicators: Aching;Constant;Grimacing Pain Intervention(s): Monitored during session;Repositioned     Hand Dominance Right   Extremity/Trunk Assessment Upper Extremity Assessment Upper Extremity Assessment: Overall WFL for tasks assessed (limited by body habitus)   Lower Extremity Assessment Lower Extremity Assessment: Defer to PT evaluation   Cervical / Trunk Assessment Cervical / Trunk Assessment: Normal   Communication Communication Communication: No difficulties   Cognition Arousal/Alertness: Awake/alert Behavior During Therapy: WFL for tasks assessed/performed Overall Cognitive Status: Within Functional Limits for tasks assessed                                       General Comments       Exercises     Shoulder Instructions      Home Living Family/patient expects to be discharged to:: Private residence Living Arrangements: Spouse/significant other Available Help at Discharge: Family;Available PRN/intermittently Type of Home: House Home Access: Ramped entrance     Home Layout: One level     Bathroom Shower/Tub: Tub/shower unit;Walk-in shower   Bathroom Toilet: Standard Bathroom Accessibility: Yes   Home Equipment: Wheelchair - power   Additional Comments: 2L O2 mostly at night      Prior Functioning/Environment Prior Level of Function : Needs assist       Physical Assist : Mobility (physical);ADLs (physical)       ADLs Comments: Patient reported husband assists with perineal hygiene at home. patient reported being independent in getting around at home with furniture walking.        OT Problem List: Decreased activity tolerance;Impaired balance (sitting and/or standing);Decreased safety  awareness;Obesity      OT Treatment/Interventions: Self-care/ADL training;Therapeutic exercise;Neuromuscular education;Energy conservation;DME and/or AE instruction;Therapeutic activities;Balance training;Patient/family education    OT Goals(Current goals can be found in the care plan section) Acute Rehab OT Goals Patient Stated Goal: to go home OT Goal Formulation: With patient Time For Goal Achievement: 06/06/21 Potential to Achieve Goals: Good  OT Frequency: Min 2X/week   Barriers to D/C:    wants to go home at time of d/c.       Co-evaluation              AM-PAC OT "6 Clicks" Daily Activity     Outcome Measure Help from another person eating meals?: None Help from another person taking care of personal grooming?: A Little Help from another person toileting, which includes using toliet, bedpan, or urinal?: A Little Help from another person bathing (including washing, rinsing, drying)?: A Lot Help from another person to put on and taking off regular upper body clothing?: A Little Help from another person to put on and taking off regular lower body clothing?: A Lot 6 Click Score: 17   End of Session Equipment Utilized During Treatment: Rolling walker (2 wheels);Gait belt Nurse Communication: Mobility status  Activity Tolerance: Patient tolerated treatment well Patient left: in chair;with call bell/phone within reach;with family/visitor present  OT Visit Diagnosis: Unsteadiness on feet (R26.81);Muscle  weakness (generalized) (M62.81)                Time: 6986-1483 OT Time Calculation (min): 40 min Charges:  OT General Charges $OT Visit: 1 Visit OT Evaluation $OT Eval Low Complexity: 1 Low OT Treatments $Self Care/Home Management : 23-37 mins  Jackelyn Poling OTR/L, MS Acute Rehabilitation Department Office# [phone] Pager# [phone]   Marcellina Millin 05/23/2021, 1:59 PM

## 2021-05-23 NOTE — Progress Notes (Signed)
Pt declined nocturnal cpap.  Pt was encouraged to call should she change her mind.

## 2021-05-23 NOTE — Plan of Care (Signed)
  Problem: Clinical Measurements: Goal: Ability to maintain clinical measurements within normal limits will improve Outcome: Progressing   Problem: Activity: Goal: Risk for activity intolerance will decrease Outcome: Progressing   Problem: Pain Managment: Goal: General experience of comfort will improve Outcome: Progressing   

## 2021-05-23 NOTE — Progress Notes (Signed)
Physical Therapy Treatment Patient Details Name: Cynthia Becker MRN: [id number] DOB: 23-Nov-1970 Today's Date: 05/23/2021   History of Present Illness 50 year old with history of HTN, PAH, diastolic CHF, DM2, chronic pain, OSA chronic pain, obesity started having cold type symptoms about 48 hours prior to admission, admitted with SOB + for  flu. Due to right knee pain CT was done that showed moderate effusion, uric acid was elevated.  Seen by orthopedics for R knee steroid injection.    PT Comments    Pt reports she got up to the recliner earlier today and that her R knee pain flared up 2* mobility and positioning in the recliner. She had IV morphine to address the pain. She declined mobility at this time due to the pain, but did agree to bed level BLE strengthening exercises. Instructed pt in several exercises she can perform independently and encouraged her to do so.  SaO2 90-94% on 2L O2 with exercises.     Recommendations for follow up therapy are one component of a multi-disciplinary discharge planning process, led by the attending physician.  Recommendations may be updated based on patient status, additional functional criteria and insurance authorization.  Follow Up Recommendations  Home health PT     Assistance Recommended at Discharge Intermittent Supervision/Assistance  Equipment Recommendations  Other (comment) (TBD)    Recommendations for Other Services       Precautions / Restrictions Precautions Precautions: Fall Precaution Comments: monitor O2 Restrictions Weight Bearing Restrictions: No     Mobility  Bed Mobility               General bed mobility comments: pt declined mobility 2* R knee pain getting flared up after getting up to recliner earlier today    Transfers Overall transfer level: Needs assistance Equipment used: Rolling walker (2 wheels) Transfers: Sit to/from Stand;Bed to chair/wheelchair/BSC Sit to Stand: Min assist Stand pivot transfers:  Min assist         General transfer comment: with increased time and education to keep RW close during transfers.    Ambulation/Gait                   Stairs             Wheelchair Mobility    Modified Rankin (Stroke Patients Only)       Balance Overall balance assessment: Needs assistance Sitting-balance support: No upper extremity supported;Feet unsupported Sitting balance-Leahy Scale: Fair     Standing balance support: Reliant on assistive device for balance;During functional activity Standing balance-Leahy Scale: Poor                              Cognition Arousal/Alertness: Awake/alert Behavior During Therapy: WFL for tasks assessed/performed Overall Cognitive Status: Within Functional Limits for tasks assessed                                          Exercises Total Joint Exercises Ankle Circles/Pumps: AROM;Both;15 reps;Supine Quad Sets: AROM;Both;5 reps;Supine Gluteal Sets: AROM;Both;5 reps;Supine Towel Squeeze: AROM;Both;5 reps;Supine Short Arc Quad: AROM;Both;5 reps;Supine Heel Slides: AAROM;Right;5 reps;Supine    General Comments        Pertinent Vitals/Pain Pain Assessment: 0-10 Pain Score: 6  Faces Pain Scale: Hurts even more Pain Location: knees R>L Pain Descriptors / Indicators: Aching;Constant;Grimacing Pain Intervention(s): Limited activity within patient's tolerance;Monitored during session;Premedicated  before session;Repositioned    Home Living Family/patient expects to be discharged to:: Private residence Living Arrangements: Spouse/significant other Available Help at Discharge: Family;Available PRN/intermittently Type of Home: House Home Access: Ramped entrance       Home Layout: One level Home Equipment: Wheelchair - power Additional Comments: 2L O2 mostly at night    Prior Function            PT Goals (current goals can now be found in the care plan section) Acute Rehab PT  Goals Patient Stated Goal: home PT Goal Formulation: With patient Time For Goal Achievement: 06/04/21 Potential to Achieve Goals: Good Progress towards PT goals: Not progressing toward goals - comment (pain limited activity tolerance this session)    Frequency    7X/week      PT Plan Current plan remains appropriate    Co-evaluation              AM-PAC PT "6 Clicks" Mobility   Outcome Measure  Help needed turning from your back to your side while in a flat bed without using bedrails?: A Lot Help needed moving from lying on your back to sitting on the side of a flat bed without using bedrails?: A Lot Help needed moving to and from a bed to a chair (including a wheelchair)?: A Lot Help needed standing up from a chair using your arms (e.g., wheelchair or bedside chair)?: A Lot Help needed to walk in hospital room?: A Lot Help needed climbing 3-5 steps with a railing? : Total 6 Click Score: 11    End of Session Equipment Utilized During Treatment: Gait belt Activity Tolerance: Patient limited by pain Patient left: in bed;with call bell/phone within reach Nurse Communication: Mobility status PT Visit Diagnosis: Other abnormalities of gait and mobility (R26.89);Difficulty in walking, not elsewhere classified (R26.2)     Time: 8657-8469 PT Time Calculation (min) (ACUTE ONLY): 31 min  Charges:  $Therapeutic Exercise: 23-37 mins                     Blondell Reveal Kistler PT 05/23/2021  Acute Rehabilitation Services Pager [phone] Office [phone]

## 2021-05-24 DIAGNOSIS — J111 Influenza due to unidentified influenza virus with other respiratory manifestations: Secondary | ICD-10-CM | POA: Diagnosis not present

## 2021-05-24 DIAGNOSIS — J101 Influenza due to other identified influenza virus with other respiratory manifestations: Secondary | ICD-10-CM | POA: Diagnosis not present

## 2021-05-24 LAB — BASIC METABOLIC PANEL
Anion gap: 5 (ref 5–15)
BUN: 23 mg/dL — ABNORMAL HIGH (ref 6–20)
CO2: 33 mmol/L — ABNORMAL HIGH (ref 22–32)
Calcium: 9.2 mg/dL (ref 8.9–10.3)
Chloride: 96 mmol/L — ABNORMAL LOW (ref 98–111)
Creatinine, Ser: 0.77 mg/dL (ref 0.44–1.00)
GFR, Estimated: >60 mL/min (ref >60)
Glucose, Bld: 146 mg/dL — ABNORMAL HIGH (ref 70–99)
Potassium: 4.2 mmol/L (ref 3.5–5.1)
Sodium: 134 mmol/L — ABNORMAL LOW (ref 135–145)

## 2021-05-24 LAB — GLUCOSE, CAPILLARY
Glucose-Capillary: 196 mg/dL — ABNORMAL HIGH (ref 70–99)
Glucose-Capillary: 228 mg/dL — ABNORMAL HIGH (ref 70–99)
Glucose-Capillary: 190 mg/dL — ABNORMAL HIGH (ref 70–99)
Glucose-Capillary: 185 mg/dL — ABNORMAL HIGH (ref 70–99)

## 2021-05-24 NOTE — Plan of Care (Signed)
  Problem: Clinical Measurements: Goal: Ability to maintain clinical measurements within normal limits will improve Outcome: Progressing   Problem: Activity: Goal: Risk for activity intolerance will decrease Outcome: Progressing   Problem: Coping: Goal: Level of anxiety will decrease Outcome: Progressing   Problem: Pain Managment: Goal: General experience of comfort will improve Outcome: Progressing   

## 2021-05-24 NOTE — Progress Notes (Signed)
Pt refuses nocturnal cpap.  Pt was advised that RT is available all night should she change her mind. 

## 2021-05-24 NOTE — Progress Notes (Signed)
Nutrition Follow-up RD working remotely.  DOCUMENTATION CODES:   Morbid obesity  INTERVENTION:  - continue Glucerna Shake BID and 30 ml Prosource Plus BID.  - weigh patient today.   NUTRITION DIAGNOSIS:   Increased nutrient needs related to acute illness as evidenced by estimated needs. -ongoing  GOAL:   Patient will meet greater than or equal to 90% of their needs -unmet  MONITOR:   PO intake, Supplement acceptance, Labs, Weight trends, I & O's  ASSESSMENT:   50 year old female with medical history of HTN, CHF, type 2 DM, chronic pain, OSA, morbid obesity, depression, anxiety, asthma, arthritis, anemia, DDD of lumbar spine, insomnia, and HLD. She presented to the ED due to progressive shortness of breath x48 hours. In the ED she was found to be positive for the flu and was started on Tamiflu.  Patient was started on a Carb Modified diet at the time of admission, and a 1.8 L/day fluid restriction was added on 11/28 at 0800.   Limited meal completion percentages documented in the chart but shows 25% of lunch and 0% of dinner on 11/24; 10% of dinner on 11/27; 100% of breakfast, 50% of lunch, 100% of dinner on 11/28.   On 11/28 she consumed a total of 1155 kcal and 43 grams protein from meals.  She has been accepting Glucerna 90% of the time offered and Prosource 75% of the time offered.   She has not been weighed since admission on 11/23. Mild pitting edema to BLE documented in the edema section of flow sheet.   MD note from yesterday indicates patient to remain inpatient due to O2 requirement remaining above baseline.    Labs reviewed; most recent HgbA1c: 6.9%, CBG: 196 mg/dl, Na: 134 mmol/l, Cl: 96 mmol/l, BUN: 23 mg/dl.   Medications reviewed; 100 mg colace BID, sliding scale novolog, 25 units semglee/day, 1 tablet multivitamin with minerals/day, 40 mg oral protonix/day, 17 g miralax/day, 40 mg deltasone/day.   Diet Order:   Diet Order             Diet Carb Modified  Fluid consistency: Thin; Room service appropriate? Yes; Fluid restriction: 1800 mL Fluid  Diet effective now                   EDUCATION NEEDS:   Not appropriate for education at this time  Skin:  Skin Assessment: Reviewed RN Assessment  Last BM:  12/1 (type 5 x1, medium amount)  Height:   Ht Readings from Last 1 Encounters:  05/16/21 5\' 5"  (1.651 m)    Weight:   Wt Readings from Last 1 Encounters:  05/16/21 (!) 212 kg    Estimated Nutritional Needs:  Kcal:  2200-2400 kcal Protein:  110-120 grams Fluid:  >/= 2.2 L/day      Jarome Matin, MS, RD, LDN, CNSC Inpatient Clinical Dietitian RD pager # available in AMION  After hours/weekend pager # available in [facility]

## 2021-05-24 NOTE — Progress Notes (Signed)
PROGRESS NOTE    Cynthia Becker  ZOX:[id number] DOB: 1970-09-20 DOA: 05/16/2021 PCP: [name], NP    Brief Narrative:  50 year old with history of HTN, PAH, diastolic CHF, DM2, chronic pain, OSA chronic pain, obesity started having cold type symptoms about 48 hours prior to admission.  She was advised to get an x-ray by her outpatient provider but she did not feel well enough to get it done.  She ended up coming to the hospital due to shortness of breath.  She was found to be flu a positive.  Hospital course also complicated by transaminitis.  Acute hepatitis panel was negative.  She was started on Tamiflu.  Due to right knee pain CT was done that showed moderate effusion, uric acid was elevated.  Seen by orthopedic steroid/lidocaine. Foley was placed due to retention  Assessment & Plan:   Principal Problem:   Flu Active Problems:   Influenza A   Unilateral primary osteoarthritis, right knee  Acute respiratory distress secondary to influenza A pneumonia Mild diastolic congestive heart failure exacerbation, class III. - Remains 4l [place] this morning  - Complete Tamiflu course - Bronchodilators scheduled and as needed, I-S/flutter - Supplemental oxygen as necessary, supplemental oxygen - BNP 19, Pro-Cal 0.5 - Last echocardiogram April 2022-EF 65%.  No wall motion abnormality -Resumed home torsemide 40mg  po daily.  -Weaned to baseline 2LNC but pt observed to be SOB on attempting to ambulate. Pt also notes markedly increased sob early AM   Right knee pain with moderate effusion, improved - Knee x-ray shows degenerative disease. Right lower extremity Dopplers-prelim no DVT.  CT of the knee shows moderate knee effusion, osteoarthritis.  Elevated uric acid, started prednisone.  Seen by orthopedic, steroid and lidocaine injected 11/26.  -Recommend outpt pain medicine follow up.    Encephalopathy/somnolent; resolved - Suspect secondary to CO2 narcosis versus narcotic use. Recommend BiPAP  as needed.  Due to her morbid obesity, BNP can be falsely low therefore was given lasix  IV. Reduced dose of oxycodone IR to 5 mg at bedtime.  Discontinued bedtime trazodone, reduced gabapentin to 800 mg twice daily -Pt has been refusing cpap at night with O2 increased overnight, but successfully weaned to baseline 2L during the day   Transaminitis; Improved.  - Suspect this could be related to hepatic congestion - RUQ ultrasound showed hepatic steatosis.   -Acute hepatitis panel noted to be negative  Microcytic anemia ruled out No evidence of acute blood loss, iron studies, TSH, B12 overall unremarkable. - hgb of 15.6, not anemic   DM2, insulin-dependent - A1c 6.9. BS better range.  - Sliding scale and Accu-Chek. Continue Lantus 25 units daily  Essential hypertension - Coreg, BiDil.  IV hydralazine and IV Lopressor as needed -BP stable and controlled   Diastolic CHF, chronic.  EF 65% - Grossly does not appear to be in volume overload but will continue to monitor.  Home diuretics currently on hold.   -Continue home Coreg and BiDil  OSA Morbid obesity with BMI greater than 75 -Currently on off and on BiPAP but needs to continue using CPAP after this at qhs off and on refusing it.  PAH - Not on any home meds.  This is likely combination of multiple issues including morbid obesity -stable, LE edema improved   Acute urinary retention - Difficult In and out cath. Foley placed with the help of Urology, appreciate help.   -Foley has been removed. Pt documented to be voiding   DVT prophylaxis: Lovenox subq  Code Status: Full Family Communication: Pt in room, family at bedside  Status is: Inpatient  Remains inpatient appropriate because: Still requiring O2 above baseline requirements this AM    Consultants:    Procedures:    Antimicrobials: Anti-infectives (From admission, onward)    Start     Dose/Rate Route Frequency Ordered Stop   05/16/21 2200  oseltamivir (TAMIFLU)  capsule 75 mg        75 mg Oral 2 times daily 05/16/21 1609 05/21/21 1010   05/16/21 1230  oseltamivir (TAMIFLU) capsule 75 mg        75 mg Oral  Once 05/16/21 1218 05/16/21 1242       Subjective: Reports still feeling subjectively sob, worse early in AM  Objective: Vitals:   05/24/21 0555 05/24/21 0810 05/24/21 1403 05/24/21 1404  BP: (!) 141/91  (!) 141/100   Pulse: 86  97   Resp: 16  19   Temp: 97.8 F (36.6 C)  97.9 F (36.6 C)   TempSrc: Oral  Oral   SpO2: 96% 94% 95% 92%  Weight:    (!) 206.5 kg  Height:        Intake/Output Summary (Last 24 hours) at 05/24/2021 1840 Last data filed at 05/24/2021 1738 Gross per 24 hour  Intake 1830 ml  Output --  Net 1830 ml    Filed Weights   05/16/21 1019 05/24/21 1404  Weight: (!) 212 kg (!) 206.5 kg    Examination: General exam: Conversant, in no acute distress Respiratory system: decreased BS throughout, end-expiratory wheeze at end of coughing Cardiovascular system: regular rhythm, s1-s2 Gastrointestinal system: Nondistended, nontender, pos BS Central nervous system: No seizures, no tremors Extremities: No cyanosis, no joint deformities Skin: No rashes, no pallor Psychiatry: Affect normal // no auditory hallucinations   Data Reviewed: I have personally reviewed following labs and imaging studies  CBC: Recent Labs  Lab 05/18/21 0425 05/19/21 0308 05/20/21 0323  WBC 9.8 5.8 6.7  HGB 17.3* 15.2* 15.6*  HCT 55.5* 48.9* 49.8*  MCV 80.7 79.1* 78.8*  PLT PLATELET CLUMPS NOTED ON SMEAR, COUNT APPEARS DECREASED 234 812    Basic Metabolic Panel: Recent Labs  Lab 05/20/21 0323 05/21/21 0337 05/22/21 0335 05/23/21 0332 05/24/21 0307  NA 139 138 135 134* 134*  K 3.4* 3.8 3.6 3.5 4.2  CL 91* 93* 95* 93* 96*  CO2 39* 36* 32 34* 33*  GLUCOSE 135* 128* 120* 110* 146*  BUN 16 16 19 20  23*  CREATININE 0.69 0.82 0.84 0.92 0.77  CALCIUM 9.1 9.3 9.2 8.8* 9.2    GFR: Estimated Creatinine Clearance: 153.4 mL/min  (by C-G formula based on SCr of 0.77 mg/dL). Liver Function Tests: Recent Labs  Lab 05/18/21 0549 05/19/21 0308 05/20/21 0323  AST 314* 149* 71*  ALT 355* 269* 185*  ALKPHOS 62 60 58  BILITOT 0.8 0.8 0.9  PROT 7.6 7.4 7.6  ALBUMIN 3.0* 2.9* 3.2*    No results for input(s): LIPASE, AMYLASE in the last 168 hours. No results for input(s): AMMONIA in the last 168 hours. Coagulation Profile: No results for input(s): INR, PROTIME in the last 168 hours. Cardiac Enzymes: No results for input(s): CKTOTAL, CKMB, CKMBINDEX, TROPONINI in the last 168 hours. BNP (last 3 results) No results for input(s): PROBNP in the last 8760 hours. HbA1C: No results for input(s): HGBA1C in the last 72 hours. CBG: Recent Labs  Lab 05/23/21 1637 05/23/21 2149 05/24/21 0836 05/24/21 1152 05/24/21 1636  GLUCAP 177* 215* 196* 228*  190*    Lipid Profile: No results for input(s): CHOL, HDL, LDLCALC, TRIG, CHOLHDL, LDLDIRECT in the last 72 hours. Thyroid Function Tests: No results for input(s): TSH, T4TOTAL, FREET4, T3FREE, THYROIDAB in the last 72 hours. Anemia Panel: No results for input(s): VITAMINB12, FOLATE, FERRITIN, TIBC, IRON, RETICCTPCT in the last 72 hours. Sepsis Labs: No results for input(s): PROCALCITON, LATICACIDVEN in the last 168 hours.   Recent Results (from the past 240 hour(s))  Resp Panel by RT-PCR (Flu A&B, Covid) Nasopharyngeal Swab     Status: Abnormal   Collection Time: 05/16/21 10:38 AM   Specimen: Nasopharyngeal Swab; Nasopharyngeal(NP) swabs in vial transport medium  Result Value Ref Range Status   SARS Coronavirus 2 by RT PCR NEGATIVE NEGATIVE Final    Comment: (NOTE) SARS-CoV-2 target nucleic acids are NOT DETECTED.  The SARS-CoV-2 RNA is generally detectable in upper respiratory specimens during the acute phase of infection. The lowest concentration of SARS-CoV-2 viral copies this assay can detect is 138 copies/mL. A negative result does not preclude  SARS-Cov-2 infection and should not be used as the sole basis for treatment or other patient management decisions. A negative result may occur with  improper specimen collection/handling, submission of specimen other than nasopharyngeal swab, presence of viral mutation(s) within the areas targeted by this assay, and inadequate number of viral copies(<138 copies/mL). A negative result must be combined with clinical observations, patient history, and epidemiological information. The expected result is Negative.  Fact Sheet for Patients:  EntrepreneurPulse.com.au  Fact Sheet for Healthcare Providers:  IncredibleEmployment.be  This test is no t yet approved or cleared by the [place] FDA and  has been authorized for detection and/or diagnosis of SARS-CoV-2 by FDA under an Emergency Use Authorization (EUA). This EUA will remain  in effect (meaning this test can be used) for the duration of the COVID-19 declaration under Section 564(b)(1) of the Act, 21 U.S.C.section 360bbb-3(b)(1), unless the authorization is terminated  or revoked sooner.       Influenza A by PCR POSITIVE (A) NEGATIVE Final   Influenza B by PCR NEGATIVE NEGATIVE Final    Comment: (NOTE) The Xpert Xpress SARS-CoV-2/FLU/RSV plus assay is intended as an aid in the diagnosis of influenza from Nasopharyngeal swab specimens and should not be used as a sole basis for treatment. Nasal washings and aspirates are unacceptable for Xpert Xpress SARS-CoV-2/FLU/RSV testing.  Fact Sheet for Patients: EntrepreneurPulse.com.au  Fact Sheet for Healthcare Providers: IncredibleEmployment.be  This test is not yet approved or cleared by the [place] FDA and has been authorized for detection and/or diagnosis of SARS-CoV-2 by FDA under an Emergency Use Authorization (EUA). This EUA will remain in effect (meaning this test can be used) for the duration of  the COVID-19 declaration under Section 564(b)(1) of the Act, 21 U.S.C. section 360bbb-3(b)(1), unless the authorization is terminated or revoked.  Performed at [facility], [place] [address], [place], [place] [address]   MRSA Next Gen by PCR, Nasal     Status: None   Collection Time: 05/18/21  4:55 AM   Specimen: Nasal Mucosa; Nasal Swab  Result Value Ref Range Status   MRSA by PCR Next Gen NOT DETECTED NOT DETECTED Final    Comment: (NOTE) The GeneXpert MRSA Assay (FDA approved for NASAL specimens only), is one component of a comprehensive MRSA colonization surveillance program. It is not intended to diagnose MRSA infection nor to guide or monitor treatment for MRSA infections. Test performance is not FDA approved in patients  less than 81 years old. Performed at [facility], [place] [address] [place], [place] [address]       Radiology Studies: No results found.  Scheduled Meds:  (feeding supplement) PROSource Plus  30 mL Oral BID BM   carvedilol  3.125 mg Oral BID WC   chlorhexidine  15 mL Mouth Rinse BID   Chlorhexidine Gluconate Cloth  6 each Topical Daily   docusate sodium  100 mg Oral BID   enoxaparin (LOVENOX) injection  40 mg Subcutaneous Q12H   feeding supplement (GLUCERNA SHAKE)  237 mL Oral BID BM   gabapentin  800 mg Oral BID   guaiFENesin  600 mg Oral BID   insulin aspart  0-15 Units Subcutaneous TID WC   insulin aspart  0-5 Units Subcutaneous QHS   insulin glargine-yfgn  25 Units Subcutaneous QHS   ipratropium-albuterol  3 mL Nebulization BID   isosorbide-hydrALAZINE  1 tablet Oral TID   linaclotide  290 mcg Oral QAC breakfast   loratadine  10 mg Oral Daily   mouth rinse  15 mL Mouth Rinse q12n4p   multivitamin with minerals  1 tablet Oral Daily   pantoprazole  40 mg Oral Daily   polyethylene glycol  17 g Oral Daily   predniSONE  40 mg Oral Q breakfast   torsemide  40 mg Oral Daily   Continuous Infusions:   LOS: 7  days   Marylu Lund, MD [facility] Pager On Amion  If 7PM-7AM, please contact night-coverage 05/24/2021, 6:40 PM

## 2021-05-24 NOTE — Progress Notes (Signed)
Physical Therapy Treatment Patient Details Name: Cynthia Becker MRN: [id number] DOB: 1971/05/05 Today's Date: 05/24/2021   History of Present Illness 50 year old with history of HTN, PAH, diastolic CHF, DM2, chronic pain, OSA chronic pain, obesity started having cold type symptoms about 48 hours prior to admission, admitted with SOB + for  flu. Due to right knee pain CT was done that showed moderate effusion, uric acid was elevated.  Seen by orthopedics for R knee steroid injection.    PT Comments    Pt OOB in recliner via Spouse.  Assisted with amb around room and practiced "one step" spouse stated they have to get into doorway.  General transfer comment: pt self able to weight shift forward and rise from recliner with increased time.  General Gait Details: cues for posture and breathing. limited by bil knee pain. SpO2=86-87% on RA, replaced O2 at rest at  2L and pt with sats in 90s.  Pt reports increased R knee pain with activity.  General stair comments: per spouse, pt has "one step up" at door to enter house.  50% VC's on proper tech, pt was able to perform twice.  Pt goes up forward and feels more comfortable going down backward.  Pt stated she also has a one step to sunken living room in which she holds on to "the back of the couch".  Pt plans to D/C to home with spouse.    Recommendations for follow up therapy are one component of a multi-disciplinary discharge planning process, led by the attending physician.  Recommendations may be updated based on patient status, additional functional criteria and insurance authorization.  Follow Up Recommendations  Home health PT     Assistance Recommended at Discharge Intermittent Supervision/Assistance  Equipment Recommendations  Rolling walker (2 wheels) (bariatric)    Recommendations for Other Services       Precautions / Restrictions Precautions Precautions: Fall Precaution Comments: monitor O2, recent fall with R knee injury      Mobility  Bed Mobility               General bed mobility comments: OOB in recliner via spouse earlier today    Transfers Overall transfer level: Needs assistance Equipment used: Rolling walker (2 wheels) Transfers: Sit to/from Stand;Bed to chair/wheelchair/BSC Sit to Stand: Supervision Stand pivot transfers: Supervision         General transfer comment: pt self able to weight shift forward and rise from recliner with increased time    Ambulation/Gait Ambulation/Gait assistance: Supervision Gait Distance (Feet): 7 Feet Assistive device: Rolling walker (2 wheels) Gait Pattern/deviations: Step-to pattern;Decreased step length - right;Decreased step length - left;Wide base of support       General Gait Details: cues for posture and breathing. limited by bil knee pain. SpO2=86-87% on RA, replaced O2 at rest at  2L and pt with sats in 90s   Stairs Stairs: Yes Stairs assistance: Supervision Stair Management: No rails;Step to pattern;With walker Number of Stairs: 1 General stair comments: per spouse, pt has "one step up" at door to enter house.  50% VC's on proper tech, pt was able to perform twice.  Pt goes up forward and feels more comfortable going down backward.  Pt stated she also has a one step to sunken living room in which she holds on to "the back of the couch".   Wheelchair Mobility    Modified Rankin (Stroke Patients Only)       Balance  Cognition Arousal/Alertness: Awake/alert Behavior During Therapy: WFL for tasks assessed/performed Overall Cognitive Status: Within Functional Limits for tasks assessed                                 General Comments: AxO x 3 very pleasant        Exercises      General Comments        Pertinent Vitals/Pain Pain Assessment: Faces Faces Pain Scale: Hurts little more Pain Location: knees R>L Pain Descriptors / Indicators:  Aching;Constant;Grimacing Pain Intervention(s): Monitored during session;Premedicated before session;Repositioned    Home Living                          Prior Function            PT Goals (current goals can now be found in the care plan section) Progress towards PT goals: Progressing toward goals    Frequency    7X/week      PT Plan Current plan remains appropriate    Co-evaluation              AM-PAC PT "6 Clicks" Mobility   Outcome Measure  Help needed turning from your back to your side while in a flat bed without using bedrails?: A Little Help needed moving from lying on your back to sitting on the side of a flat bed without using bedrails?: A Little Help needed moving to and from a bed to a chair (including a wheelchair)?: A Little Help needed standing up from a chair using your arms (e.g., wheelchair or bedside chair)?: A Little Help needed to walk in hospital room?: A Little Help needed climbing 3-5 steps with a railing? : A Little 6 Click Score: 18    End of Session   Activity Tolerance: Patient tolerated treatment well Patient left: in chair;with family/visitor present Nurse Communication: Mobility status PT Visit Diagnosis: Other abnormalities of gait and mobility (R26.89);Difficulty in walking, not elsewhere classified (R26.2)     Time: 1135-1200 PT Time Calculation (min) (ACUTE ONLY): 25 min  Charges:  $Gait Training: 8-22 mins $Therapeutic Activity: 8-22 mins                     Rica Koyanagi  PTA Acute  Rehabilitation Services Pager      [phone] Office      [phone]

## 2021-05-25 DIAGNOSIS — I10 Essential (primary) hypertension: Secondary | ICD-10-CM

## 2021-05-25 DIAGNOSIS — J111 Influenza due to unidentified influenza virus with other respiratory manifestations: Secondary | ICD-10-CM | POA: Diagnosis not present

## 2021-05-25 DIAGNOSIS — J101 Influenza due to other identified influenza virus with other respiratory manifestations: Secondary | ICD-10-CM | POA: Diagnosis not present

## 2021-05-25 LAB — BASIC METABOLIC PANEL
Anion gap: 5 (ref 5–15)
BUN: 22 mg/dL — ABNORMAL HIGH (ref 6–20)
CO2: 34 mmol/L — ABNORMAL HIGH (ref 22–32)
Calcium: 9.1 mg/dL (ref 8.9–10.3)
Chloride: 96 mmol/L — ABNORMAL LOW (ref 98–111)
Creatinine, Ser: 0.79 mg/dL (ref 0.44–1.00)
GFR, Estimated: >60 mL/min (ref >60)
Glucose, Bld: 119 mg/dL — ABNORMAL HIGH (ref 70–99)
Potassium: 3.9 mmol/L (ref 3.5–5.1)
Sodium: 135 mmol/L (ref 135–145)

## 2021-05-25 LAB — GLUCOSE, CAPILLARY
Glucose-Capillary: 122 mg/dL — ABNORMAL HIGH (ref 70–99)
Glucose-Capillary: 283 mg/dL — ABNORMAL HIGH (ref 70–99)
Glucose-Capillary: 220 mg/dL — ABNORMAL HIGH (ref 70–99)

## 2021-05-25 MED ORDER — PREDNISONE 20 MG PO TABS
40.0000 mg | ORAL_TABLET | Freq: Every day | ORAL | 0 refills | Status: AC
Start: 2021-05-26 — End: 2021-05-29

## 2021-05-25 MED ORDER — ISOSORB DINITRATE-HYDRALAZINE 20-37.5 MG PO TABS: 1.0000 | ORAL_TABLET | Freq: Two times a day (BID) | ORAL | 0 refills | Status: AC

## 2021-05-25 NOTE — TOC Transition Note (Signed)
Transition of Care [facility]) - CM/SW Discharge Note  Patient Details  Name: Cynthia Becker MRN: [id number] Date of Birth: 30-Mar-1971  Transition of Care [facility]) CM/SW Contact:  Sherie Don, LCSW Phone Number: 05/25/2021, 3:13 PM  Clinical Narrative: PT and OT recommended HH. Patient will also need a bariatric walker and 3N1. CSW made DME referral to [facility] with [employer]. [employer] to deliver DME to patient's room. CSW made [facility] referral to Cindie with [employer]. CSW updated patient. TOC signing off.  Final next level of care: Home w Home Health Services Barriers to Discharge: Barriers Resolved  Patient Goals and CMS Choice CMS Medicare.gov Compare Post Acute Care list provided to:: Patient Choice offered to / list presented to : Patient  Discharge Plan and Services       DME Arranged: 3-N-1, Walker rolling Gilford Rile and 3N1 will be bariatric) DME Agency: [employer] Date DME Agency Contacted: 05/25/21 Time DME Agency Contacted: [id number] Representative spoke with at DME Agency: [place]: PT, OT [place] Agency: [place] Date [place]: 05/25/21 Time [place]: [id number] Representative spoke with at [place]: Cindie  Readmission Risk Interventions No flowsheet data found.

## 2021-05-25 NOTE — Progress Notes (Signed)
Physical Therapy Treatment Patient Details Name: Cynthia Becker MRN: [id number] DOB: [birth year] Today's Date: 05/25/2021   History of Present Illness 50 year old with history of HTN, PAH, diastolic CHF, DM2, chronic pain, OSA chronic pain, obesity started having cold type symptoms about 48 hours prior to admission, admitted with SOB + for  flu. Due to right knee pain CT was done that showed moderate effusion, uric acid was elevated.  Seen by orthopedics for R knee steroid injection.    PT Comments    Reviewed BLE strengthening exercises. Pt put forth good effort. R knee pain limits overall activity tolerance. She stated her R knee pain is flared up today and that she's not up to ambulating at this time. SaO2 88-91% on 2L O2 with exercises.      Recommendations for follow up therapy are one component of a multi-disciplinary discharge planning process, led by the attending physician.  Recommendations may be updated based on patient status, additional functional criteria and insurance authorization.  Follow Up Recommendations  Home health PT     Assistance Recommended at Discharge Intermittent Supervision/Assistance  Equipment Recommendations  Rolling walker (2 wheels) (bariatric) ; 3 in 1 bariatric   Recommendations for Other Services       Precautions / Restrictions Precautions Precautions: Fall Precaution Comments: monitor O2, recent fall with R knee injury Restrictions Weight Bearing Restrictions: No     Mobility  Bed Mobility               General bed mobility comments: OOB in recliner via spouse earlier today    Transfers                   General transfer comment: pt reported R knee pain is flared up and she prefers not to stand nor ambulate at present. She has been ambulating to the bedside commode as needed today.    Ambulation/Gait                   Stairs             Wheelchair Mobility    Modified Rankin (Stroke Patients Only)        Balance                                            Cognition Arousal/Alertness: Awake/alert Behavior During Therapy: WFL for tasks assessed/performed Overall Cognitive Status: Within Functional Limits for tasks assessed                                 General Comments: AxO x 3 very pleasant        Exercises Total Joint Exercises Ankle Circles/Pumps: AROM;Both;15 reps;Supine Quad Sets: AROM;Both;5 reps;Supine Gluteal Sets: AROM;Both;5 reps;Supine Towel Squeeze: AROM;Both;5 reps;Supine Short Arc Quad: AROM;Supine;Right;5 reps    General Comments        Pertinent Vitals/Pain Pain Score: 4  Pain Location: knees R>L Pain Descriptors / Indicators: Aching;Constant;Grimacing Pain Intervention(s): Limited activity within patient's tolerance;Monitored during session;Premedicated before session    Home Living                          Prior Function            PT Goals (current goals can now be found in the care plan  section) Acute Rehab PT Goals Patient Stated Goal: home PT Goal Formulation: With patient Time For Goal Achievement: 06/04/21 Potential to Achieve Goals: Good Progress towards PT goals: Progressing toward goals    Frequency    7X/week      PT Plan Current plan remains appropriate    Co-evaluation              AM-PAC PT "6 Clicks" Mobility   Outcome Measure  Help needed turning from your back to your side while in a flat bed without using bedrails?: A Little Help needed moving from lying on your back to sitting on the side of a flat bed without using bedrails?: A Little Help needed moving to and from a bed to a chair (including a wheelchair)?: A Little Help needed standing up from a chair using your arms (e.g., wheelchair or bedside chair)?: A Little Help needed to walk in hospital room?: A Little Help needed climbing 3-5 steps with a railing? : A Little 6 Click Score: 18    End of Session  Equipment Utilized During Treatment: Gait belt Activity Tolerance: Patient tolerated treatment well Patient left: in chair;with family/visitor present Nurse Communication: Mobility status PT Visit Diagnosis: Other abnormalities of gait and mobility (R26.89);Difficulty in walking, not elsewhere classified (R26.2)     Time: 6811-5726 PT Time Calculation (min) (ACUTE ONLY): 29 min  Charges:  $Therapeutic Exercise: 23-37 mins                    Blondell Reveal Kistler PT 05/25/2021  Acute Rehabilitation Services Pager [phone] Office [phone]

## 2021-05-25 NOTE — Plan of Care (Signed)
  Problem: Education: Goal: Knowledge of General Education information will improve Description Including pain rating scale, medication(s)/side effects and non-pharmacologic comfort measures Outcome: Progressing   Problem: Health Behavior/Discharge Planning: Goal: Ability to manage health-related needs will improve Outcome: Progressing   

## 2021-05-25 NOTE — Discharge Summary (Signed)
Physician Discharge Summary  Cynthia Becker XYI:[id number] DOB: 04/10/1971 DOA: 05/16/2021  PCP: [name], NP  Admit date: 05/16/2021 Discharge date: 05/25/2021  Admitted From: Home Disposition:  Home  Recommendations for Outpatient Follow-up:  Follow up with PCP in 1-2 weeks Follow up with Orthopedic Surgery as needed  Discharge Condition:Improved CODE STATUS:Full Diet recommendation: Diabetic   Brief/Interim Summary: 50 year old with history of HTN, PAH, diastolic CHF, DM2, chronic pain, OSA chronic pain, obesity started having cold type symptoms about 48 hours prior to admission.  She was advised to get an x-ray by her outpatient provider but she did not feel well enough to get it done.  She ended up coming to the hospital due to shortness of breath.  She was found to be flu a positive.  Hospital course also complicated by transaminitis.  Acute hepatitis panel was negative.  She was started on Tamiflu.  Due to right knee pain CT was done that showed moderate effusion, uric acid was elevated.  Seen by orthopedic steroid/lidocaine. Foley was placed due to retention   Discharge Diagnoses:  Principal Problem:   Flu Active Problems:   Influenza A   Unilateral primary osteoarthritis, right knee  Acute respiratory distress secondary to influenza A pneumonia Mild diastolic congestive heart failure exacerbation, class III. - Remains 4l [place] this morning  - Complete Tamiflu course - Bronchodilators scheduled and as needed, I-S/flutter - Supplemental oxygen as necessary, supplemental oxygen - BNP 19, Pro-Cal 0.5 - Last echocardiogram April 2022-EF 65%.  No wall motion abnormality -Resumed home torsemide 35m po daily.  [facility]   Right knee pain with moderate effusion, improved - Knee x-ray shows degenerative disease. Right lower extremity Dopplers-prelim no DVT.  CT of the knee shows moderate knee effusion,  osteoarthritis.  Elevated uric acid, started prednisone.  Seen by orthopedic, steroid and lidocaine injected 11/26.  -Recommend outpt pain medicine follow up.    Encephalopathy/somnolent; resolved - Suspect secondary to CO2 narcosis versus narcotic use. Recommend BiPAP as needed.  Due to her morbid obesity, BNP can be falsely low therefore was given lasix  IV. Reduced dose of oxycodone IR to 5 mg at bedtime.  Discontinued bedtime trazodone, reduced gabapentin to 800 mg twice daily -Pt has been refusing cpap at night with O2 increased overnight, but successfully weaned to baseline 2L during the day, continue home O2   Transaminitis; Improved.  - Suspect this could be related to hepatic congestion - RUQ ultrasound showed hepatic steatosis.   -Acute hepatitis panel noted to be negative  Microcytic anemia ruled out No evidence of acute blood loss, iron studies, TSH, B12 overall unremarkable. - hgb of 15.6, not anemic   DM2, insulin-dependent - A1c 6.9. BS better range.  - Sliding scale and Accu-Chek. Continue Lantus 25 units daily  Essential hypertension - Coreg, BiDil.  IV hydralazine and IV Lopressor as needed -BP stable and controlled   Diastolic CHF, chronic.  EF 65% - Grossly does not appear to be in volume overload but will continue to monitor.  Home diuretics currently on hold.   -Continue home Coreg and BiDil  OSA ruled out Morbid obesity with BMI greater than 75 -Most recent sleep study from 12/22/19 was reviewed. On that study, pt did have several obstructive events, however they were not frequent enough to qualify for a diagnosis of OSA -while in hospital, was on off and on BiPAP -Cont home O2 as tolerated  PAH - Not  on any home meds.  This is likely combination of multiple issues including morbid obesity -stable, LE edema improved   Acute urinary retention - Difficult In and out cath. Foley placed with the help of Urology, appreciate help.   -Foley has been removed. Pt  documented to be voiding     Discharge Instructions   Allergies as of 05/25/2021   No Known Allergies      Medication List     TAKE these medications    Accu-Chek FastClix Lancets Misc USE TO CHECK BLOOD SUGAR TWICE DAILY   Accu-Chek Guide test strip Generic drug: glucose blood 1 each by Other route 3 (three) times daily. Use as instructed   albuterol (2.5 MG/3ML) 0.083% nebulizer solution Commonly known as: PROVENTIL USE 1 VIAL IN NEBULIZER EVERY 6 HOURS AS NEEDED FOR WHEEZING OR SHORTNESS OF BREATH What changed: See the new instructions.   benzonatate 200 MG capsule Commonly known as: TESSALON Take 1 capsule (200 mg total) by mouth 2 (two) times daily as needed for cough.   carvedilol 3.125 MG tablet Commonly known as: COREG Take 1 tablet (3.125 mg total) by mouth 2 (two) times daily with a meal.   cetirizine 10 MG tablet Commonly known as: ZyrTEC Allergy Take 1 tablet (10 mg total) by mouth daily.   cyclobenzaprine 5 MG tablet Commonly known as: FLEXERIL Take 1 tablet by mouth three times daily as needed for muscle spasm   Fluticasone-Salmeterol 250-50 MCG/DOSE Aepb Commonly known as: Advair Diskus Inhale 1 puff into the lungs 2 (two) times daily.   gabapentin 800 MG tablet Commonly known as: NEURONTIN Take 1 tablet (800 mg total) by mouth 3 (three) times daily.   ibuprofen 800 MG tablet Commonly known as: ADVIL Take 800 mg by mouth every 8 (eight) hours as needed for moderate pain.   insulin glargine 100 UNIT/ML Solostar Pen Commonly known as: LANTUS Inject 20 Units into the skin at bedtime.   isosorbide-hydrALAZINE 20-37.5 MG tablet Commonly known as: BiDil Take 1 tablet by mouth in the morning and at bedtime.   Linzess 290 MCG Caps capsule Generic drug: linaclotide TAKE 1 CAPSULE BY MOUTH ONCE DAILY BEFORE BREAKFAST What changed: See the new instructions.   naloxone 4 MG/0.1ML Liqd nasal spray kit Commonly known as: NARCAN Place 1 spray into  the nose as needed.   omeprazole 20 MG capsule Commonly known as: PRILOSEC Take 20 mg by mouth at bedtime.   Oxycodone HCl 10 MG Tabs Take 10 mg by mouth every 4 (four) hours as needed (pain).   PEN NEEDLES 31GX5/16" 31G X 8 MM Misc 1 pen by Does not apply route at bedtime.   predniSONE 20 MG tablet Commonly known as: DELTASONE Take 2 tablets (40 mg total) by mouth daily with breakfast for 3 days. Start taking on: May 26, 2021   promethazine 25 MG tablet Commonly known as: PHENERGAN Take 25 mg by mouth every 8 (eight) hours as needed for nausea or vomiting.   QUEtiapine 400 MG tablet Commonly known as: SEROQUEL Take 400 mg by mouth at bedtime as needed (sleep).   torsemide 20 MG tablet Commonly known as: DEMADEX Take 2 tablets by mouth once daily   vitamin B-12 1000 MCG tablet Commonly known as: CYANOCOBALAMIN Take 1,000 mcg by mouth daily.   vitamin C 500 MG tablet Commonly known as: ASCORBIC ACID Take 500 mg by mouth daily.               Durable Medical Equipment  (  From admission, onward)           Start     Ordered   05/25/21 1335  For home use only DME Walker wide  Once       Question:  Patient needs a walker to treat with the following condition  Answer:  Morbid obesity ([place])   05/25/21 1336   05/25/21 1335  For home use only DME Bedside commode  Once       Comments: Bariatric commode  Question:  Patient needs a bedside commode to treat with the following condition  Answer:  Morbid obesity ([place])   05/25/21 1336            Follow-up Information     [facility], [facility] Follow up.   Specialty: Home Health Services Why: PT and OT Contact information: [place] [address] [phone]         [name], NP Follow up in 2 week(s).   Specialty: Adult Health Nurse Practitioner Why: Hospital follow up Contact information: [address] [place]er [place] [address] [phone]                 No Known Allergies  Consultations: Orthopedic Surgery  Procedures/Studies: DG Knee 1-2 Views Right  Result Date: 05/17/2021 CLINICAL DATA:  Knee pain EXAM: RIGHT KNEE - 1-2 VIEW COMPARISON:  Right knee x-ray 06/13/2020 FINDINGS: Somewhat limited due to body habitus and portable technique. No acute fracture identified. Mild-to-moderate narrowing of the patellofemoral joint and medial compartment with marginal osteophyte formation. IMPRESSION: Degenerative changes with no definite fracture identified. Electronically Signed   By: Ofilia Neas M.D.   On: 05/17/2021 13:18   CT Angio Chest PE W and/or Wo Contrast  Result Date: 05/16/2021 CLINICAL DATA:  Short of breath.  Rule out pulmonary embolism. EXAM: CT ANGIOGRAPHY CHEST WITH CONTRAST TECHNIQUE: Multidetector CT imaging of the chest was performed using the standard protocol during bolus administration of intravenous contrast. Multiplanar CT image reconstructions and MIPs were obtained to evaluate the vascular anatomy. CONTRAST:  139m OMNIPAQUE IOHEXOL 350 MG/ML SOLN COMPARISON:  CT angio chest for 04/2021 FINDINGS: Cardiovascular: Negative for pulmonary embolism. Pulmonary artery enlargement. Main pulmonary artery 3.6 cm in diameter normal thoracic aorta. Heart mildly enlarged. No pericardial effusion. Mediastinum/Nodes: Scattered small mediastinal and hilar lymph nodes bilaterally, stable. Lungs/Pleura: Lungs are well aerated and clear. No infiltrate effusion or nodule. Upper Abdomen: No acute abnormality.  Cholecystectomy clips. Musculoskeletal: No acute abnormality. Review of the MIP images confirms the above findings. IMPRESSION: 1. Negative for pulmonary embolus. 2. Pulmonary artery enlargement unchanged 3. No acute abnormality in the chest. Electronically Signed   By: CFranchot GalloM.D.   On: 05/16/2021 13:30   CT KNEE RIGHT WO CONTRAST  Result Date: 05/18/2021 CLINICAL DATA:  Persistent right knee pain.  Knee instability. EXAM: CT  OF THE RIGHT KNEE WITHOUT CONTRAST TECHNIQUE: Multidetector CT imaging of the right knee was performed according to the standard protocol. Multiplanar CT image reconstructions were also generated. COMPARISON:  Radiographs 05/17/2021 and 06/13/2020. FINDINGS: Bones/Joint/Cartilage There are age advanced tricompartmental degenerative changes with joint space narrowing, osteophytes and multiple intra-articular loose bodies. The joint space narrowing is greatest in the medial compartment. There is a moderate size knee joint effusion. No evidence of acute fracture, dislocation or avascular necrosis. Ligaments Suboptimally assessed by CT. There are central osteophytes in the intercondylar notch. Muscles and Tendons Intact extensor mechanism.  No focal muscular atrophy identified. Soft tissues  Mild subcutaneous edema laterally in the distal thigh. No focal fluid collection, foreign body or soft tissue emphysema. IMPRESSION: 1. Age advanced tricompartmental osteoarthritis with multiple intra-articular loose bodies and a moderate-sized knee joint effusion. 2. No evidence of acute fracture or dislocation. 3. No significant soft tissue abnormalities identified. Electronically Signed   By: Richardean Sale M.D.   On: 05/18/2021 15:15   DG CHEST PORT 1 VIEW  Result Date: 05/18/2021 CLINICAL DATA:  Aspiration, increasing shortness of breath. EXAM: PORTABLE CHEST 1 VIEW COMPARISON:  04/17/2021. FINDINGS: The heart is borderline enlarged the mediastinal structures are within normal limits. The pulmonary vasculature is prominent. Lung volumes are low. No consolidation, effusion, or pneumothorax. No acute osseous abnormality. IMPRESSION: Borderline cardiomegaly with distended pulmonary vasculature. Electronically Signed   By: Brett Fairy M.D.   On: 05/18/2021 04:40   VAS [place] LOWER EXTREMITY VENOUS (DVT)  Result Date: 05/18/2021  Lower Venous DVT Study Patient Name:  Edy Mehler  Date of Exam:   05/17/2021 Medical Rec #:  [id number]      Accession #:    [id number] Date of Birth: February 03, 1971     Patient Gender: F Patient Age:   54 years Exam Location:  [facility] Procedure:      VAS [place] LOWER EXTREMITY VENOUS (DVT) Referring Phys: Gerlean Ren --------------------------------------------------------------------------------  Indications: Pain.  Limitations: Body habitus. Comparison Study: No previous exams Performing Technologist: Jody Hill RVT, RDMS  Examination Guidelines: A complete evaluation includes B-mode imaging, spectral Doppler, color Doppler, and power Doppler as needed of all accessible portions of each vessel. Bilateral testing is considered an integral part of a complete examination. Limited examinations for reoccurring indications may be performed as noted. The reflux portion of the exam is performed with the patient in reverse Trendelenburg.  +---------+---------------+---------+-----------+----------+-------------------+ RIGHT    CompressibilityPhasicitySpontaneityPropertiesThrombus Aging      +---------+---------------+---------+-----------+----------+-------------------+ CFV      Full                                                             +---------+---------------+---------+-----------+----------+-------------------+ SFJ      Full                                                             +---------+---------------+---------+-----------+----------+-------------------+ FV Prox  Full           Yes      Yes                                      +---------+---------------+---------+-----------+----------+-------------------+ FV Mid   Full           Yes      Yes                                      +---------+---------------+---------+-----------+----------+-------------------+ FV DistalFull           Yes      Yes                                      +---------+---------------+---------+-----------+----------+-------------------+  PFV      Full                                                              +---------+---------------+---------+-----------+----------+-------------------+ POP                     Yes      Yes                  patent by color and                                                       doppler.            +---------+---------------+---------+-----------+----------+-------------------+ PTV      Full                                                             +---------+---------------+---------+-----------+----------+-------------------+ PERO                                                  Not visualized on                                                         this exam           +---------+---------------+---------+-----------+----------+-------------------+   +----+---------------+---------+-----------+----------+--------------+ LEFTCompressibilityPhasicitySpontaneityPropertiesThrombus Aging +----+---------------+---------+-----------+----------+--------------+ CFV Full           Yes      Yes                                 +----+---------------+---------+-----------+----------+--------------+     *See table(s) above for measurements and observations. Electronically signed by Orlie Pollen on 05/18/2021 at 10:45:44 AM.    Final    US Abdomen Limited RUQ (LIVER/GB)  Result Date: 05/17/2021 CLINICAL DATA:  Transaminitis. EXAM: ULTRASOUND ABDOMEN LIMITED RIGHT UPPER QUADRANT COMPARISON:  Abdominal ultrasound dated March 29, 2010. FINDINGS: Gallbladder: Surgically absent. Common bile duct: Diameter: 6 mm, normal. Liver: No focal lesion identified. Heterogeneously increased parenchymal echogenicity. Portal vein is patent on color Doppler imaging with normal direction of blood flow towards the liver. Other: None. IMPRESSION: 1. Heterogeneously increased parenchymal echogenicity in the liver is nonspecific, but could reflect underlying steatosis or other liver disease. 2. Prior cholecystectomy.  Electronically Signed   By: Titus Dubin M.D.   On: 05/17/2021 09:18    Subjective: Eager to go home today  Discharge Exam: Vitals:   05/25/21 0442 05/25/21 0812  BP: 134/84   Pulse: 80   Resp: 16   Temp: 98.9  F (37.2 C)   SpO2: 97% 95%   Vitals:   05/24/21 2016 05/24/21 2142 05/25/21 0442 05/25/21 0812  BP:  (!) 151/90 134/84   Pulse:  84 80   Resp:  16 16   Temp:  98 F (36.7 C) 98.9 F (37.2 C)   TempSrc:  Oral Oral   SpO2: 97% 95% 97% 95%  Weight:      Height:        General: Pt is alert, awake, not in acute distress Cardiovascular: RRR, S1/S2 + Respiratory: CTA bilaterally, no wheezing, no rhonchi Abdominal: Soft, NT, ND, bowel sounds + Extremities: no edema, no cyanosis   The results of significant diagnostics from this hospitalization (including imaging, microbiology, ancillary and laboratory) are listed below for reference.     Microbiology: Recent Results (from the past 240 hour(s))  Resp Panel by RT-PCR (Flu A&B, Covid) Nasopharyngeal Swab     Status: Abnormal   Collection Time: 05/16/21 10:38 AM   Specimen: Nasopharyngeal Swab; Nasopharyngeal(NP) swabs in vial transport medium  Result Value Ref Range Status   SARS Coronavirus 2 by RT PCR NEGATIVE NEGATIVE Final    Comment: (NOTE) SARS-CoV-2 target nucleic acids are NOT DETECTED.  The SARS-CoV-2 RNA is generally detectable in upper respiratory specimens during the acute phase of infection. The lowest concentration of SARS-CoV-2 viral copies this assay can detect is 138 copies/mL. A negative result does not preclude SARS-Cov-2 infection and should not be used as the sole basis for treatment or other patient management decisions. A negative result may occur with  improper specimen collection/handling, submission of specimen other than nasopharyngeal swab, presence of viral mutation(s) within the areas targeted by this assay, and inadequate number of viral copies(<138 copies/mL). A negative result  must be combined with clinical observations, patient history, and epidemiological information. The expected result is Negative.  Fact Sheet for Patients:  EntrepreneurPulse.com.au  Fact Sheet for Healthcare Providers:  IncredibleEmployment.be  This test is no t yet approved or cleared by the [place] FDA and  has been authorized for detection and/or diagnosis of SARS-CoV-2 by FDA under an Emergency Use Authorization (EUA). This EUA will remain  in effect (meaning this test can be used) for the duration of the COVID-19 declaration under Section 564(b)(1) of the Act, 21 U.S.C.section 360bbb-3(b)(1), unless the authorization is terminated  or revoked sooner.       Influenza A by PCR POSITIVE (A) NEGATIVE Final   Influenza B by PCR NEGATIVE NEGATIVE Final    Comment: (NOTE) The Xpert Xpress SARS-CoV-2/FLU/RSV plus assay is intended as an aid in the diagnosis of influenza from Nasopharyngeal swab specimens and should not be used as a sole basis for treatment. Nasal washings and aspirates are unacceptable for Xpert Xpress SARS-CoV-2/FLU/RSV testing.  Fact Sheet for Patients: EntrepreneurPulse.com.au  Fact Sheet for Healthcare Providers: IncredibleEmployment.be  This test is not yet approved or cleared by the [place] FDA and has been authorized for detection and/or diagnosis of SARS-CoV-2 by FDA under an Emergency Use Authorization (EUA). This EUA will remain in effect (meaning this test can be used) for the duration of the COVID-19 declaration under Section 564(b)(1) of the Act, 21 U.S.C. section 360bbb-3(b)(1), unless the authorization is terminated or revoked.  Performed at [facility], [place] [address] [place], [place] [address]   MRSA Next Gen by PCR, Nasal     Status: None   Collection Time: 05/18/21  4:55 AM   Specimen: Nasal Mucosa; Nasal Swab  Result  Value Ref Range  Status   MRSA by PCR Next Gen NOT DETECTED NOT DETECTED Final    Comment: (NOTE) The GeneXpert MRSA Assay (FDA approved for NASAL specimens only), is one component of a comprehensive MRSA colonization surveillance program. It is not intended to diagnose MRSA infection nor to guide or monitor treatment for MRSA infections. Test performance is not FDA approved in patients less than 58 years old. Performed at [facility], [place] [address], [place], [place] [address]      Labs: BNP (last 3 results) Recent Labs    04/17/21 0636 05/16/21 1100 05/18/21 0549  BNP 7.3 118.4* 38.2   Basic Metabolic Panel: Recent Labs  Lab 05/21/21 0337 05/22/21 0335 05/23/21 0332 05/24/21 0307 05/25/21 0343  NA 138 135 134* 134* 135  K 3.8 3.6 3.5 4.2 3.9  CL 93* 95* 93* 96* 96*  CO2 36* 32 34* 33* 34*  GLUCOSE 128* 120* 110* 146* 119*  BUN _0 23* 22*  CREATININE 0.82 0.84 0.92 0.77 0.79  CALCIUM 9.3 9.2 8.8* 9.2 9.1   Liver Function Tests: Recent Labs  Lab 05/19/21 0308 05/20/21 0323  AST 149* 71*  ALT 269* 185*  ALKPHOS 60 58  BILITOT 0.8 0.9  PROT 7.4 7.6  ALBUMIN 2.9* 3.2*   No results for input(s): LIPASE, AMYLASE in the last 168 hours. No results for input(s): AMMONIA in the last 168 hours. CBC: Recent Labs  Lab 05/19/21 0308 05/20/21 0323  WBC 5.8 6.7  HGB 15.2* 15.6*  HCT 48.9* 49.8*  MCV 79.1* 78.8*  PLT 234 245   Cardiac Enzymes: No results for input(s): CKTOTAL, CKMB, CKMBINDEX, TROPONINI in the last 168 hours. BNP: Invalid input(s): POCBNP CBG: Recent Labs  Lab 05/24/21 1152 05/24/21 1636 05/24/21 2145 05/25/21 0756 05/25/21 1151  GLUCAP 228* 190* 185* 122* 283*   D-Dimer No results for input(s): DDIMER in the last 72 hours. Hgb A1c No results for input(s): HGBA1C in the last 72 hours. Lipid Profile No results for input(s): CHOL, HDL, LDLCALC, TRIG, CHOLHDL, LDLDIRECT in the last 72 hours. Thyroid function studies No  results for input(s): TSH, T4TOTAL, T3FREE, THYROIDAB in the last 72 hours.  Invalid input(s): FREET3 Anemia work up No results for input(s): VITAMINB12, FOLATE, FERRITIN, TIBC, IRON, RETICCTPCT in the last 72 hours. Urinalysis    Component Value Date/Time   COLORURINE YELLOW 04/17/2021 1208   APPEARANCEUR CLOUDY (A) 04/17/2021 1208   LABSPEC 1.028 04/17/2021 1208   PHURINE 6.0 04/17/2021 1208   GLUCOSEU NEGATIVE 04/17/2021 1208   HGBUR NEGATIVE 04/17/2021 1208   HGBUR negative 08/02/2010 1448   BILIRUBINUR NEGATIVE 04/17/2021 1208   BILIRUBINUR neg 04/19/2020 1454   KETONESUR NEGATIVE 04/17/2021 1208   PROTEINUR 30 (A) 04/17/2021 1208   UROBILINOGEN 0.2 04/19/2020 1454   UROBILINOGEN 1.0 02/21/2012 1647   NITRITE NEGATIVE 04/17/2021 1208   LEUKOCYTESUR TRACE (A) 04/17/2021 1208   Sepsis Labs Invalid input(s): PROCALCITONIN,  WBC,  LACTICIDVEN Microbiology Recent Results (from the past 240 hour(s))  Resp Panel by RT-PCR (Flu A&B, Covid) Nasopharyngeal Swab     Status: Abnormal   Collection Time: 05/16/21 10:38 AM   Specimen: Nasopharyngeal Swab; Nasopharyngeal(NP) swabs in vial transport medium  Result Value Ref Range Status   SARS Coronavirus 2 by RT PCR NEGATIVE NEGATIVE Final    Comment: (NOTE) SARS-CoV-2 target nucleic acids are NOT DETECTED.  The SARS-CoV-2 RNA is generally detectable in upper respiratory specimens during the acute phase of infection. The lowest  concentration of SARS-CoV-2 viral copies this assay can detect is 138 copies/mL. A negative result does not preclude SARS-Cov-2 infection and should not be used as the sole basis for treatment or other patient management decisions. A negative result may occur with  improper specimen collection/handling, submission of specimen other than nasopharyngeal swab, presence of viral mutation(s) within the areas targeted by this assay, and inadequate number of viral copies(<138 copies/mL). A negative result must be  combined with clinical observations, patient history, and epidemiological information. The expected result is Negative.  Fact Sheet for Patients:  EntrepreneurPulse.com.au  Fact Sheet for Healthcare Providers:  IncredibleEmployment.be  This test is no t yet approved or cleared by the [place] FDA and  has been authorized for detection and/or diagnosis of SARS-CoV-2 by FDA under an Emergency Use Authorization (EUA). This EUA will remain  in effect (meaning this test can be used) for the duration of the COVID-19 declaration under Section 564(b)(1) of the Act, 21 U.S.C.section 360bbb-3(b)(1), unless the authorization is terminated  or revoked sooner.       Influenza A by PCR POSITIVE (A) NEGATIVE Final   Influenza B by PCR NEGATIVE NEGATIVE Final    Comment: (NOTE) The Xpert Xpress SARS-CoV-2/FLU/RSV plus assay is intended as an aid in the diagnosis of influenza from Nasopharyngeal swab specimens and should not be used as a sole basis for treatment. Nasal washings and aspirates are unacceptable for Xpert Xpress SARS-CoV-2/FLU/RSV testing.  Fact Sheet for Patients: EntrepreneurPulse.com.au  Fact Sheet for Healthcare Providers: IncredibleEmployment.be  This test is not yet approved or cleared by the [place] FDA and has been authorized for detection and/or diagnosis of SARS-CoV-2 by FDA under an Emergency Use Authorization (EUA). This EUA will remain in effect (meaning this test can be used) for the duration of the COVID-19 declaration under Section 564(b)(1) of the Act, 21 U.S.C. section 360bbb-3(b)(1), unless the authorization is terminated or revoked.  Performed at [facility], [place] [address], [place], [place] [address]   MRSA Next Gen by PCR, Nasal     Status: None   Collection Time: 05/18/21  4:55 AM   Specimen: Nasal Mucosa; Nasal Swab  Result Value Ref Range Status    MRSA by PCR Next Gen NOT DETECTED NOT DETECTED Final    Comment: (NOTE) The GeneXpert MRSA Assay (FDA approved for NASAL specimens only), is one component of a comprehensive MRSA colonization surveillance program. It is not intended to diagnose MRSA infection nor to guide or monitor treatment for MRSA infections. Test performance is not FDA approved in patients less than 61 years old. Performed at [facility], [place] [address] [place], [place] [address]    Time spent: 30 min  SIGNED:   Marylu Lund, MD  [facility] 05/25/2021, 1:50 PM  If 7PM-7AM, please contact night-coverage

## 2021-05-25 NOTE — Progress Notes (Signed)
The patient is alert and oriented and has been seen by her physician. The orders for discharge were written. IV has been removed. Went over discharge instructions with patient and family. She is being discharged via wheelchair with all of her belongings.  

## 2021-06-06 ENCOUNTER — Telehealth: Payer: Self-pay | Admitting: *Deleted

## 2021-06-06 NOTE — Telephone Encounter (Signed)
Transition Care Management Unsuccessful Follow-up Telephone Call  Date of discharge and from where: 05/25/21  [facility]  Attempts:  1st Attempt  Reason for unsuccessful TCM follow-up call:  No answer/busy  Jacqlyn Larsen [facility], BSN RN Case Manager [phone]

## 2021-06-08 ENCOUNTER — Telehealth: Payer: Self-pay

## 2021-06-08 NOTE — Telephone Encounter (Signed)
Transition Care Management Unsuccessful Follow-up Telephone Call  Date of discharge and from where:  05/25/2021  [facility]  Attempts:  2nd Attempt  Reason for unsuccessful TCM follow-up call:  No answer/busy Tomasa Rand, RN, BSN, CEN [place] Coordinator [phone]

## 2021-06-11 ENCOUNTER — Ambulatory Visit: Payer: Medicaid Other | Admitting: Nurse Practitioner

## 2021-06-12 ENCOUNTER — Encounter: Payer: Self-pay | Admitting: Nurse Practitioner

## 2021-06-12 ENCOUNTER — Ambulatory Visit (INDEPENDENT_AMBULATORY_CARE_PROVIDER_SITE_OTHER): Payer: Medicaid Other | Admitting: Nurse Practitioner

## 2021-06-12 ENCOUNTER — Other Ambulatory Visit: Payer: Self-pay

## 2021-06-12 VITALS — BP 153/87 | HR 97 | Temp 97.0°F | Ht 65.0 in | Wt >= 6400 oz

## 2021-06-12 DIAGNOSIS — G894 Chronic pain syndrome: Secondary | ICD-10-CM

## 2021-06-12 DIAGNOSIS — R0600 Dyspnea, unspecified: Secondary | ICD-10-CM | POA: Diagnosis not present

## 2021-06-12 DIAGNOSIS — R601 Generalized edema: Secondary | ICD-10-CM

## 2021-06-12 DIAGNOSIS — I5032 Chronic diastolic (congestive) heart failure: Secondary | ICD-10-CM | POA: Diagnosis not present

## 2021-06-12 DIAGNOSIS — K5909 Other constipation: Secondary | ICD-10-CM

## 2021-06-12 DIAGNOSIS — R051 Acute cough: Secondary | ICD-10-CM | POA: Diagnosis not present

## 2021-06-12 MED ORDER — BENZONATATE 200 MG PO CAPS: 200.0000 mg | ORAL_CAPSULE | Freq: Two times a day (BID) | ORAL | 0 refills | Status: DC | PRN

## 2021-06-12 MED ORDER — CYCLOBENZAPRINE HCL 5 MG PO TABS
5.0000 mg | ORAL_TABLET | Freq: Three times a day (TID) | ORAL | 0 refills | Status: DC | PRN
Start: 2021-06-12 — End: 2021-07-04

## 2021-06-12 MED ORDER — LINACLOTIDE 290 MCG PO CAPS: 290.0000 ug | ORAL_CAPSULE | Freq: Every day | ORAL | 0 refills | Status: DC

## 2021-06-12 MED ORDER — TORSEMIDE 40 MG PO TABS: 40.0000 mg | ORAL_TABLET | Freq: Two times a day (BID) | ORAL | 2 refills | Status: DC

## 2021-06-12 NOTE — Progress Notes (Signed)
[place], [place]  [address] Phone:  [phone]   Fax:  [phone] Subjective:   Patient ID: Cynthia Becker, female    DOB: [birth year], 50 y.o.   MRN: [id number]  Chief Complaint  Patient presents with   Hospitalization Follow-up    05/16/2021 - 05/25/2021, Flu, SOB. Wanting to increase Linzess. Torsemide increased to 49m at discharged stated she has gained 20lbs since discharge.    HPI MAshani Pumphrey89y.o. female  has a past medical history of Anemia, Anxiety, Arthritis, Asthma, CHF (congestive heart failure) ([place], CN (constipation) (09/14/2014), DEGENERATIVE DISC DISEASE, LUMBOSACRAL SPINE W/RADICULOPATHY (11/01/2009), Depression, Diabetes ([place], H/O dizziness, Headache(784.0), High blood pressure, HYPERLIPIDEMIA (02/13/2010), INSOMNIA (08/24/2008), Morbid obesity ([place] (05/24/2008), and SVD (spontaneous vaginal delivery).  [facility].  Patient states that she was admitted to the hospital 2 wks ago for influenza and shortness of breath. Patient states two weeks ago, she fell at home, was able to get up and get into her bed, and shortly after that she became very ill. Husband called ambulance to transfer patient to the hospital, patient has no memory of leaving her home. In the ED, she had increased coughing with vomiting. Was admitted to the ICU, where she received treatment for significant influenza infection with shortness of breath and severe bilateral knee pain. States that her knee pain has improved since admission, currently mild, increases with ambulation. While in the hospital she was given Lasix, which also helped to improve her symptoms. She lost a total of 50 lbs. Since being discharge from the hospital she has gained 20 lbs, with a total of 2-5 lbs per day, concerned she may be retaining fluid.  Currently compliant with all medications, including those prescribed in the hospital. Endorses changing  diet, including reducing sodium intake and increasing activity at home. States that she is motivated by a desire not to  be readmitted to the hospital. Received consultation regarding knee surgery while in the hospital, informed that she would need further management of chronic dyspnea prior to having surgery.   Patient requesting refill of cough medicine, continues to have some chest congestion since discharge. Also requesting refill of medication for constipation. Denies any other complaints today. States that her morale has improved since last visit in clinic.  Denies any fatigue, chest pain, HA or dizziness. Denies any blurred vision, numbness or tingling.   Past Medical History:  Diagnosis Date   Anemia    iv iron treatment dec 2014/ see oncology for this last in dec 2014   Anxiety    Arthritis    knee, hands   Asthma    seasonal related/ albuterol used when uri   CHF (congestive heart failure) (HCC)    CN (constipation) 09/14/2014   DEGENERATIVE DISC DISEASE, LUMBOSACRAL SPINE W/RADICULOPATHY 11/01/2009   Qualifier: Diagnosis of  By: WJorene Minors Scott     Depression    Diabetes ([place]    H/O dizziness    Hx   Headache(784.0)    otc med prn   High blood pressure    HYPERLIPIDEMIA 02/13/2010   Qualifier: Diagnosis of  By: WVersie Starks    INSOMNIA 08/24/2008   Qualifier: Diagnosis of  By: RRadene OuMD, [place]    Morbid obesity ([place] 05/24/2008   Qualifier: Diagnosis of  By: RRadene OuMD, [place]    SVD (spontaneous vaginal delivery)    x 3  Past Surgical History:  Procedure Laterality Date   ABDOMINAL HYSTERECTOMY  03/13/2014   total    BILATERAL SALPINGECTOMY Bilateral 04/12/2014   Procedure: BILATERAL SALPINGECTOMY;  Surgeon: Woodroe Mode, MD;  Location: [facility];  Service: Gynecology;  Laterality: Bilateral;   CARPAL TUNNEL RELEASE  08/30/2011   Procedure: CARPAL TUNNEL RELEASE;  Surgeon: Wynonia Sours, MD;  Location: [facility];  Service:  Orthopedics;  Laterality: Right;   CHOLECYSTECTOMY  10/11   lap choli   GALLBLADDER SURGERY  04/04/2010   HYSTEROSCOPY N/A 02/08/2014   Procedure: HYSTEROSCOPY;  Surgeon: Woodroe Mode, MD;  Location: [facility];  Service: Gynecology;  Laterality: N/A;   HYSTEROSCOPY WITH NOVASURE N/A 02/08/2014   Procedure: ATTEMPTED NOVASURE ABLATION;  Surgeon: Woodroe Mode, MD;  Location: [facility];  Service: Gynecology;  Laterality: N/A;   INTRAUTERINE DEVICE INSERTION     07/19/2013   SEPTOPLASTY N/A 09/08/2013   Procedure: SEPTOPLASTY;  Surgeon: Ruby Cola, MD;  Location: [facility];  Service: ENT;  Laterality: N/A;   SPINE SURGERY     TONSILLECTOMY     TONSILLECTOMY AND ADENOIDECTOMY Bilateral 09/08/2013   Procedure: TONSILLECTOMY ;  Surgeon: Ruby Cola, MD;  Location: [facility];  Service: ENT;  Laterality: Bilateral;   TUBAL LIGATION     VAGINAL HYSTERECTOMY N/A 04/12/2014   Procedure: LAPAROSCOPIC ASSISTED VAGINAL HYSTERECTOMY;  Surgeon: Woodroe Mode, MD;  Location: [facility];  Service: Gynecology;  Laterality: N/A;   WISDOM TOOTH EXTRACTION      Family History  Problem Relation Age of Onset   Diabetes Mother    Hypertension Father    Heart disease Other    Diabetes Other    Hypertension Other    Alcohol abuse Other    Hypertension Brother    Asthma Brother    Hypertension Brother    Asthma Brother    COPD Maternal Grandmother    Lung cancer Maternal Grandmother    Asthma Paternal Grandmother    Asthma Paternal Aunt     Social History   Socioeconomic History   Marital status: Married    Spouse name: Not on file   Number of children: 3   Years of education: Not on file   Highest education level: Not on file  Occupational History   Not on file  Tobacco Use   Smoking status: Former    Packs/day: 0.25    Types: Cigarettes    Quit date: 04/24/2020    Years since quitting: 1.1   Smokeless tobacco: Never   Tobacco comments:    4-5 cigarettes a day  Vaping Use   Vaping Use: Never used   Substance and Sexual Activity   Alcohol use: No   Drug use: No   Sexual activity: Not on file  Other Topics Concern   Not on file  Social History Narrative   Not on file   Social Determinants of Health   Financial Resource Strain: Not on file  Food Insecurity: Not on file  Transportation Needs: Not on file  Physical Activity: Not on file  Stress: Not on file  Social Connections: Not on file  Intimate Partner Violence: Not on file    Outpatient Medications Prior to Visit  Medication Sig Dispense Refill   ACCU-CHEK FASTCLIX LANCETS MISC USE TO CHECK BLOOD SUGAR TWICE DAILY  11   ACCU-CHEK GUIDE test strip 1 each by Other route 3 (three) times daily. Use as instructed 270 each 3   albuterol (PROVENTIL) (2.5  MG/3ML) 0.083% nebulizer solution USE 1 VIAL IN NEBULIZER EVERY 6 HOURS AS NEEDED FOR WHEEZING OR SHORTNESS OF BREATH (Patient taking differently: Take 2.5 mg by nebulization every 6 (six) hours as needed for wheezing or shortness of breath.) 150 mL 0   cetirizine (ZYRTEC ALLERGY) 10 MG tablet Take 1 tablet (10 mg total) by mouth daily. 90 tablet 3   Fluticasone-Salmeterol (ADVAIR DISKUS) 250-50 MCG/DOSE AEPB Inhale 1 puff into the lungs 2 (two) times daily. 60 each 11   gabapentin (NEURONTIN) 800 MG tablet Take 1 tablet (800 mg total) by mouth 3 (three) times daily. 270 tablet 0   ibuprofen (ADVIL) 800 MG tablet Take 800 mg by mouth every 8 (eight) hours as needed for moderate pain.     insulin glargine (LANTUS) 100 UNIT/ML Solostar Pen Inject 20 Units into the skin at bedtime. 15 mL 11   Insulin Pen Needle (PEN NEEDLES 31GX5/16") 31G X 8 MM MISC 1 pen by Does not apply route at bedtime. 100 each 2   isosorbide-hydrALAZINE (BIDIL) 20-37.5 MG tablet Take 1 tablet by mouth in the morning and at bedtime. 60 tablet 0   naloxone (NARCAN) nasal spray 4 mg/0.1 mL Place 1 spray into the nose as needed.     omeprazole (PRILOSEC) 20 MG capsule Take 20 mg by mouth at bedtime.     Oxycodone  HCl 10 MG TABS Take 10 mg by mouth every 4 (four) hours as needed (pain).     promethazine (PHENERGAN) 25 MG tablet Take 25 mg by mouth every 8 (eight) hours as needed for nausea or vomiting.     QUEtiapine (SEROQUEL) 400 MG tablet Take 400 mg by mouth at bedtime as needed (sleep).     vitamin B-12 (CYANOCOBALAMIN) 1000 MCG tablet Take 1,000 mcg by mouth daily.     vitamin C (ASCORBIC ACID) 500 MG tablet Take 500 mg by mouth daily.     benzonatate (TESSALON) 200 MG capsule Take 1 capsule (200 mg total) by mouth 2 (two) times daily as needed for cough. 20 capsule 0   cyclobenzaprine (FLEXERIL) 5 MG tablet Take 1 tablet by mouth three times daily as needed for muscle spasm 90 tablet 0   LINZESS 290 MCG CAPS capsule TAKE 1 CAPSULE BY MOUTH ONCE DAILY BEFORE BREAKFAST (Patient taking differently: Take 290 mcg by mouth daily before breakfast.) 90 capsule 0   torsemide (DEMADEX) 20 MG tablet Take 2 tablets by mouth once daily (Patient taking differently: Take 40 mg by mouth daily.) 60 tablet 0   carvedilol (COREG) 3.125 MG tablet Take 1 tablet (3.125 mg total) by mouth 2 (two) times daily with a meal. 60 tablet 1   No facility-administered medications prior to visit.    No Known Allergies  Review of Systems  Constitutional:  Negative for chills, fever and malaise/fatigue.  HENT: Negative.    Eyes: Negative.   Respiratory:  Positive for shortness of breath. Negative for cough, hemoptysis, sputum production and wheezing.        See HPI  Cardiovascular:  Negative for chest pain, palpitations and leg swelling.  Gastrointestinal:  Negative for abdominal pain, blood in stool, constipation, diarrhea, nausea and vomiting.  Genitourinary: Negative.   Musculoskeletal:  Positive for joint pain.       See HPI  Skin: Negative.   Neurological: Negative.   Psychiatric/Behavioral:  Negative for depression. The patient is not nervous/anxious.   All other systems reviewed and are negative.     Objective:  Physical Exam Vitals reviewed.  Constitutional:      General: She is not in acute distress.    Appearance: Normal appearance. She is obese.     Comments: 5 lb weight gain   HENT:     Head: Normocephalic.  Cardiovascular:     Rate and Rhythm: Normal rate and regular rhythm.     Pulses: Normal pulses.     Heart sounds: Normal heart sounds.     Comments: No obvious peripheral edema Pulmonary:     Effort: Pulmonary effort is normal.     Breath sounds: Normal breath sounds.  Skin:    General: Skin is warm and dry.     Capillary Refill: Capillary refill takes less than 2 seconds.  Neurological:     General: No focal deficit present.     Mental Status: She is alert and oriented to person, place, and time.  Psychiatric:        Mood and Affect: Mood normal.        Behavior: Behavior normal.        Thought Content: Thought content normal.        Judgment: Judgment normal.    BP (!) 153/87 (BP Location: Right Arm, Patient Position: Sitting)    Pulse 97    Temp (!) 97 F (36.1 C)    Ht 5' 5"  (1.651 m)    Wt (!) 460 lb (208.7 kg)    LMP 02/13/2014    SpO2 98%    BMI 76.55 kg/m  Wt Readings from Last 3 Encounters:  06/12/21 (!) 460 lb (208.7 kg)  05/24/21 (!) 455 lb 3.2 oz (206.5 kg)  05/14/21 (!) 478 lb (216.8 kg)    Immunization History  Administered Date(s) Administered   Influenza Whole 04/25/2010   Influenza,inj,Quad PF,6+ Mos 03/07/2014, 03/10/2019   Pneumococcal Polysaccharide-23 04/13/2014   Td 08/02/2010    Diabetic Foot Exam - Simple   No data filed     Lab Results  Component Value Date   TSH 1.720 05/17/2021   Lab Results  Component Value Date   WBC 6.7 05/20/2021   HGB 15.6 (H) 05/20/2021   HCT 49.8 (H) 05/20/2021   MCV 78.8 (L) 05/20/2021   PLT 245 05/20/2021   Lab Results  Component Value Date   NA 135 05/25/2021   K 3.9 05/25/2021   CHLORIDE 107 04/07/2013   CO2 34 (H) 05/25/2021   GLUCOSE 119 (H) 05/25/2021   BUN 22 (H) 05/25/2021    CREATININE 0.79 05/25/2021   BILITOT 0.9 05/20/2021   ALKPHOS 58 05/20/2021   AST 71 (H) 05/20/2021   ALT 185 (H) 05/20/2021   PROT 7.6 05/20/2021   ALBUMIN 3.2 (L) 05/20/2021   CALCIUM 9.1 05/25/2021   ANIONGAP 5 05/25/2021   EGFR 67 05/14/2021   Lab Results  Component Value Date   CHOL 200 (H) 05/14/2021   CHOL 210 (H) 04/19/2020   CHOL 188 08/17/2010   Lab Results  Component Value Date   HDL 41 05/14/2021   HDL 43 04/19/2020   HDL 43 08/17/2010   Lab Results  Component Value Date   LDLCALC 141 (H) 05/14/2021   LDLCALC 151 (H) 04/19/2020   LDLCALC 127 (H) 08/17/2010   Lab Results  Component Value Date   TRIG 99 05/14/2021   TRIG 90 04/19/2020   TRIG 91 08/17/2010   Lab Results  Component Value Date   CHOLHDL 4.9 (H) 05/14/2021   CHOLHDL 4.9 (H) 04/19/2020   CHOLHDL 4.4  Ratio 08/17/2010   Lab Results  Component Value Date   HGBA1C 6.9 (H) 05/16/2021   HGBA1C 6.8 (H) 04/18/2021   HGBA1C 7.2 (H) 10/02/2020       Assessment & Plan:   Problem List Items Addressed This Visit       Cardiovascular and Mediastinum   Chronic diastolic CHF (congestive heart failure) (HCC) - Primary   Relevant Medications   torsemide 40 MG TABS   Other Relevant Orders   AMB referral to CHF clinic Encouraged continued diet and exercise efforts  Encouraged continued compliance with medication       Other   CN (constipation)   Relevant Medications   linaclotide (LINZESS) 290 MCG CAPS capsule Discussed non pharmacological methods for management of constipation    Other Visit Diagnoses     Acute cough       Relevant Medications   benzonatate (TESSALON) 200 MG capsule   Other Relevant Orders   Ambulatory referral to Pulmonology   Dyspnea, unspecified type       Relevant Orders   Ambulatory referral to Pulmonology   Generalized edema       Relevant Medications   torsemide 40 MG TABS, given patient recent history and positive HPI, dose increased   Chronic pain syndrome        Relevant Medications   cyclobenzaprine (FLEXERIL) 5 MG tablet   Follow up in 2 mths, for reevaluation of chronic illness, sooner as needed    I have discontinued Malaiyah Dalgleish's Linzess. I have also changed her cyclobenzaprine and Torsemide. Additionally, I am having her start on linaclotide. Lastly, I am having her maintain her Accu-Chek FastClix Lancets, Oxycodone HCl, Fluticasone-Salmeterol, insulin glargine, PEN NEEDLES 31GX5/16", ibuprofen, cetirizine, Accu-Chek Guide, albuterol, carvedilol, naloxone, promethazine, vitamin B-12, vitamin C, QUEtiapine, gabapentin, omeprazole, isosorbide-hydrALAZINE, and benzonatate.  Meds ordered this encounter  Medications   cyclobenzaprine (FLEXERIL) 5 MG tablet    Sig: Take 1 tablet (5 mg total) by mouth 3 (three) times daily as needed. for muscle spams    Dispense:  90 tablet    Refill:  0   benzonatate (TESSALON) 200 MG capsule    Sig: Take 1 capsule (200 mg total) by mouth 2 (two) times daily as needed for cough.    Dispense:  20 capsule    Refill:  0   torsemide 40 MG TABS    Sig: Take 40 mg by mouth 2 (two) times daily.    Dispense:  30 tablet    Refill:  2   linaclotide (LINZESS) 290 MCG CAPS capsule    Sig: Take 1 capsule (290 mcg total) by mouth daily before breakfast.    Dispense:  30 capsule    Refill:  0     Teena Dunk, NP

## 2021-06-12 NOTE — Patient Instructions (Addendum)
You were seen today in the [facility] for follow up after hospital admission. You were prescribed medications, please take as directed. Please maintain upcoming follow up in February 2023.

## 2021-06-15 ENCOUNTER — Other Ambulatory Visit: Payer: Self-pay | Admitting: Nurse Practitioner

## 2021-06-15 DIAGNOSIS — R601 Generalized edema: Secondary | ICD-10-CM

## 2021-06-15 DIAGNOSIS — I5032 Chronic diastolic (congestive) heart failure: Secondary | ICD-10-CM

## 2021-06-15 MED ORDER — TORSEMIDE 20 MG PO TABS: 40.0000 mg | ORAL_TABLET | Freq: Two times a day (BID) | ORAL | 1 refills | Status: DC

## 2021-07-03 ENCOUNTER — Other Ambulatory Visit: Payer: Self-pay | Admitting: Nurse Practitioner

## 2021-07-03 DIAGNOSIS — G894 Chronic pain syndrome: Secondary | ICD-10-CM

## 2021-07-10 ENCOUNTER — Telehealth (HOSPITAL_COMMUNITY): Payer: Self-pay | Admitting: Vascular Surgery

## 2021-07-10 NOTE — Telephone Encounter (Signed)
Left pt message giving CHF appt 2/14 @ 2:40 W/ DB asked pt to call back to confirm

## 2021-07-23 ENCOUNTER — Other Ambulatory Visit: Payer: Self-pay | Admitting: Cardiology

## 2021-07-23 DIAGNOSIS — I10 Essential (primary) hypertension: Secondary | ICD-10-CM

## 2021-07-26 NOTE — Progress Notes (Deleted)
Synopsis: Referred for dyspnea, acute cough by [name], NP  Subjective:   PATIENT ID: Cynthia Becker GENDER: female DOB: [birth year], MRN: [id number]  No chief complaint on file.  51yF with history of allergic asthma, severe obesity, GERD, IDA, CHF, former smoker referred for dyspnea, acute cough  Otherwise pertinent review of systems is negative.  Past Medical History:  Diagnosis Date   Anemia    iv iron treatment dec 2014/ see oncology for this last in dec 2014   Anxiety    Arthritis    knee, hands   Asthma    seasonal related/ albuterol used when uri   CHF (congestive heart failure) (HCC)    CN (constipation) 09/14/2014   DEGENERATIVE DISC DISEASE, LUMBOSACRAL SPINE W/RADICULOPATHY 11/01/2009   Qualifier: Diagnosis of  By: Jorene Minors, Scott     Depression    Diabetes ([place])    H/O dizziness    Hx   Headache(784.0)    otc med prn   High blood pressure    HYPERLIPIDEMIA 02/13/2010   Qualifier: Diagnosis of  By: Jorene Minors, Scott     INSOMNIA 08/24/2008   Qualifier: Diagnosis of  By: Radene Ou MD, [place]     Morbid obesity ([place]) 05/24/2008   Qualifier: Diagnosis of  By: Radene Ou MD, [place]     SVD (spontaneous vaginal delivery)    x 3     Family History  Problem Relation Age of Onset   Diabetes Mother    Hypertension Father    Heart disease Other    Diabetes Other    Hypertension Other    Alcohol abuse Other    Hypertension Brother    Asthma Brother    Hypertension Brother    Asthma Brother    COPD Maternal Grandmother    Lung cancer Maternal Grandmother    Asthma Paternal Grandmother    Asthma Paternal Aunt      Past Surgical History:  Procedure Laterality Date   ABDOMINAL HYSTERECTOMY  03/13/2014   total    BILATERAL SALPINGECTOMY Bilateral 04/12/2014   Procedure: BILATERAL SALPINGECTOMY;  Surgeon: Woodroe Mode, MD;  Location: [facility];  Service: Gynecology;  Laterality: Bilateral;   CARPAL TUNNEL RELEASE  08/30/2011   Procedure: CARPAL TUNNEL  RELEASE;  Surgeon: Wynonia Sours, MD;  Location: [facility];  Service: Orthopedics;  Laterality: Right;   CHOLECYSTECTOMY  10/11   lap choli   GALLBLADDER SURGERY  04/04/2010   HYSTEROSCOPY N/A 02/08/2014   Procedure: HYSTEROSCOPY;  Surgeon: Woodroe Mode, MD;  Location: [facility];  Service: Gynecology;  Laterality: N/A;   HYSTEROSCOPY WITH NOVASURE N/A 02/08/2014   Procedure: ATTEMPTED NOVASURE ABLATION;  Surgeon: Woodroe Mode, MD;  Location: [facility];  Service: Gynecology;  Laterality: N/A;   INTRAUTERINE DEVICE INSERTION     07/19/2013   SEPTOPLASTY N/A 09/08/2013   Procedure: SEPTOPLASTY;  Surgeon: Ruby Cola, MD;  Location: [facility];  Service: ENT;  Laterality: N/A;   SPINE SURGERY     TONSILLECTOMY     TONSILLECTOMY AND ADENOIDECTOMY Bilateral 09/08/2013   Procedure: TONSILLECTOMY ;  Surgeon: Ruby Cola, MD;  Location: [facility];  Service: ENT;  Laterality: Bilateral;   TUBAL LIGATION     VAGINAL HYSTERECTOMY N/A 04/12/2014   Procedure: LAPAROSCOPIC ASSISTED VAGINAL HYSTERECTOMY;  Surgeon: Woodroe Mode, MD;  Location: [facility];  Service: Gynecology;  Laterality: N/A;   WISDOM TOOTH EXTRACTION      Social History   Socioeconomic History   Marital  status: Married    Spouse name: Not on file   Number of children: 3   Years of education: Not on file   Highest education level: Not on file  Occupational History   Not on file  Tobacco Use   Smoking status: Former    Packs/day: 0.25    Types: Cigarettes    Quit date: 04/24/2020    Years since quitting: 1.2   Smokeless tobacco: Never   Tobacco comments:    4-5 cigarettes a day  Vaping Use   Vaping Use: Never used  Substance and Sexual Activity   Alcohol use: No   Drug use: No   Sexual activity: Not on file  Other Topics Concern   Not on file  Social History Narrative   Not on file   Social Determinants of Health   Financial Resource Strain: Not on file  Food Insecurity: Not on file  Transportation Needs: Not  on file  Physical Activity: Not on file  Stress: Not on file  Social Connections: Not on file  Intimate Partner Violence: Not on file     No Known Allergies   Outpatient Medications Prior to Visit  Medication Sig Dispense Refill   ACCU-CHEK FASTCLIX LANCETS MISC USE TO CHECK BLOOD SUGAR TWICE DAILY  11   ACCU-CHEK GUIDE test strip 1 each by Other route 3 (three) times daily. Use as instructed 270 each 3   albuterol (PROVENTIL) (2.5 MG/3ML) 0.083% nebulizer solution USE 1 VIAL IN NEBULIZER EVERY 6 HOURS AS NEEDED FOR WHEEZING OR SHORTNESS OF BREATH (Patient taking differently: Take 2.5 mg by nebulization every 6 (six) hours as needed for wheezing or shortness of breath.) 150 mL 0   benzonatate (TESSALON) 200 MG capsule Take 1 capsule (200 mg total) by mouth 2 (two) times daily as needed for cough. 20 capsule 0   carvedilol (COREG) 3.125 MG tablet Take 1 tablet (3.125 mg total) by mouth 2 (two) times daily with a meal. 60 tablet 1   cetirizine (ZYRTEC ALLERGY) 10 MG tablet Take 1 tablet (10 mg total) by mouth daily. 90 tablet 3   cyclobenzaprine (FLEXERIL) 5 MG tablet Take 1 tablet by mouth three times daily as needed for muscle spasm 90 tablet 0   Fluticasone-Salmeterol (ADVAIR DISKUS) 250-50 MCG/DOSE AEPB Inhale 1 puff into the lungs 2 (two) times daily. 60 each 11   gabapentin (NEURONTIN) 800 MG tablet Take 1 tablet (800 mg total) by mouth 3 (three) times daily. 270 tablet 0   ibuprofen (ADVIL) 800 MG tablet Take 800 mg by mouth every 8 (eight) hours as needed for moderate pain.     insulin glargine (LANTUS) 100 UNIT/ML Solostar Pen Inject 20 Units into the skin at bedtime. 15 mL 11   Insulin Pen Needle (PEN NEEDLES 31GX5/16") 31G X 8 MM MISC 1 pen by Does not apply route at bedtime. 100 each 2   linaclotide (LINZESS) 290 MCG CAPS capsule Take 1 capsule (290 mcg total) by mouth daily before breakfast. 30 capsule 0   naloxone (NARCAN) nasal spray 4 mg/0.1 mL Place 1 spray into the nose as  needed.     omeprazole (PRILOSEC) 20 MG capsule Take 20 mg by mouth at bedtime.     Oxycodone HCl 10 MG TABS Take 10 mg by mouth every 4 (four) hours as needed (pain).     promethazine (PHENERGAN) 25 MG tablet Take 25 mg by mouth every 8 (eight) hours as needed for nausea or vomiting.  QUEtiapine (SEROQUEL) 400 MG tablet Take 400 mg by mouth at bedtime as needed (sleep).     torsemide (DEMADEX) 20 MG tablet Take 2 tablets (40 mg total) by mouth 2 (two) times daily. 120 tablet 1   vitamin B-12 (CYANOCOBALAMIN) 1000 MCG tablet Take 1,000 mcg by mouth daily.     vitamin C (ASCORBIC ACID) 500 MG tablet Take 500 mg by mouth daily.     No facility-administered medications prior to visit.       Objective:   Physical Exam:  General appearance: 51 y.o., female, NAD, conversant  Eyes: anicteric sclerae; PERRL, tracking appropriately HENT: NCAT; MMM Neck: Trachea midline; no lymphadenopathy, no JVD Lungs: CTAB, no crackles, no wheeze, with normal respiratory effort CV: RRR, no murmur  Abdomen: Soft, non-tender; non-distended, BS present  Extremities: No peripheral edema, warm Skin: Normal turgor and texture; no rash Psych: Appropriate affect Neuro: Alert and oriented to person and place, no focal deficit     There were no vitals filed for this visit.   on *** LPM *** RA BMI Readings from Last 3 Encounters:  06/12/21 76.55 kg/m  05/24/21 75.75 kg/m  05/14/21 79.54 kg/m   Wt Readings from Last 3 Encounters:  06/12/21 (!) 460 lb (208.7 kg)  05/24/21 (!) 455 lb 3.2 oz (206.5 kg)  05/14/21 (!) 478 lb (216.8 kg)     CBC    Component Value Date/Time   WBC 6.7 05/20/2021 0323   RBC 6.32 (H) 05/20/2021 0323   HGB 15.6 (H) 05/20/2021 0323   HGB 15.8 05/14/2021 1246   HGB 13.3 09/12/2014 1512   HCT 49.8 (H) 05/20/2021 0323   HCT 48.9 (H) 05/17/2021 0819   HCT 42.4 09/12/2014 1512   PLT 245 05/20/2021 0323   PLT 233 05/14/2021 1246   MCV 78.8 (L) 05/20/2021 0323   MCV 77  (L) 05/14/2021 1246   MCV 64.6 (L) 09/12/2014 1512   MCH 24.7 (L) 05/20/2021 0323   MCHC 31.3 05/20/2021 0323   RDW 18.1 (H) 05/20/2021 0323   RDW 17.1 (H) 05/14/2021 1246   RDW 21.6 (H) 09/12/2014 1512   LYMPHSABS 1.1 05/16/2021 1100   LYMPHSABS 1.2 05/14/2021 1246   LYMPHSABS 2.4 09/12/2014 1512   MONOABS 0.9 05/16/2021 1100   MONOABS 0.6 09/12/2014 1512   EOSABS 0.0 05/16/2021 1100   EOSABS 0.3 05/14/2021 1246   BASOSABS 0.1 05/16/2021 1100   BASOSABS 0.0 05/14/2021 1246   BASOSABS 0.1 09/12/2014 1512    Eos 0-500  Chest Imaging: CTA Chest 05/16/21 reviewed by me unremarkable  Pulmonary Functions Testing Results: PFT Results Latest Ref Rng & Units 05/26/2020  FVC-Pre L 2.35  FVC-Predicted Pre % 78  FVC-Post L 2.45  FVC-Predicted Post % 81  Pre FEV1/FVC % % 88  Post FEV1/FCV % % 92  FEV1-Pre L 2.07  FEV1-Predicted Pre % 85  FEV1-Post L 2.25  DLCO uncorrected ml/min/mmHg 19.80  DLCO UNC% % 90  DLCO corrected ml/min/mmHg 19.80  DLCO COR %Predicted % 90  DLVA Predicted % 133   Reviewed by me with mild restriction, negative BD response, normal loops  PSG 12/22/19 at 443 lb: The overall apnea/hypopnea index (AHI) was 3.1 per hour. There were 3 total apneas, including 3 obstructive, 0 central and 0 mixed apneas. There were 13 hypopneas and 7 RERAs.   The AHI during Stage REM sleep was 18.1 per hour.   AHI while supine was 3.1 per hour.   The mean oxygen saturation was 90.3%. The minimum  SpO2 during sleep was 79.0%.  She spent 120.1 minutes with an SpO2 < 88%.  She had 1 liter supplemental oxygen applied.   Soft snoring was noted during this study   Echocardiogram: ***  Heart Catheterization: ***    Assessment & Plan:    Plan:      Maryjane Hurter, MD [facility] 07/26/2021 5:17 PM

## 2021-07-27 ENCOUNTER — Ambulatory Visit: Payer: Medicaid Other | Admitting: Student

## 2021-08-07 ENCOUNTER — Encounter (HOSPITAL_COMMUNITY): Payer: Medicaid Other | Admitting: Internal Medicine

## 2021-08-15 ENCOUNTER — Ambulatory Visit: Payer: Medicaid Other | Admitting: Nurse Practitioner

## 2021-08-20 NOTE — Progress Notes (Unsigned)
Synopsis: Referred for acute cough, dyspnea by [name], NP  Subjective:   PATIENT ID: Cynthia Becker GENDER: female DOB: [birth year], MRN: [id number]  No chief complaint on file.  6yF with history of severe obesity, AR, allergic asthma previously followed by Dr. Carlis Abbott, diastolic heart failure referred for acute cough, dyspnea.  Hospitalized 05/2021 for flu, acute on chronic diastolic heart failure  Otherwise pertinent review of systems is negative.  Past Medical History:  Diagnosis Date   Anemia    iv iron treatment dec 2014/ see oncology for this last in dec 2014   Anxiety    Arthritis    knee, hands   Asthma    seasonal related/ albuterol used when uri   CHF (congestive heart failure) (HCC)    CN (constipation) 09/14/2014   DEGENERATIVE DISC DISEASE, LUMBOSACRAL SPINE W/RADICULOPATHY 11/01/2009   Qualifier: Diagnosis of  By: Jorene Minors, [place]     Depression    Diabetes ([place])    H/O dizziness    Hx   Headache(784.0)    otc med prn   High blood pressure    HYPERLIPIDEMIA 02/13/2010   Qualifier: Diagnosis of  By: Jorene Minors, Scott     INSOMNIA 08/24/2008   Qualifier: Diagnosis of  By: Radene Ou MD, [place]     Morbid obesity ([place]) 05/24/2008   Qualifier: Diagnosis of  By: Radene Ou MD, [place]     SVD (spontaneous vaginal delivery)    x 3     Family History  Problem Relation Age of Onset   Diabetes Mother    Hypertension Father    Heart disease Other    Diabetes Other    Hypertension Other    Alcohol abuse Other    Hypertension Brother    Asthma Brother    Hypertension Brother    Asthma Brother    COPD Maternal Grandmother    Lung cancer Maternal Grandmother    Asthma Paternal Grandmother    Asthma Paternal Aunt      Past Surgical History:  Procedure Laterality Date   ABDOMINAL HYSTERECTOMY  03/13/2014   total    BILATERAL SALPINGECTOMY Bilateral 04/12/2014   Procedure: BILATERAL SALPINGECTOMY;  Surgeon: Woodroe Mode, MD;  Location: [facility];   Service: Gynecology;  Laterality: Bilateral;   CARPAL TUNNEL RELEASE  08/30/2011   Procedure: CARPAL TUNNEL RELEASE;  Surgeon: Wynonia Sours, MD;  Location: [facility];  Service: Orthopedics;  Laterality: Right;   CHOLECYSTECTOMY  10/11   lap choli   GALLBLADDER SURGERY  04/04/2010   HYSTEROSCOPY N/A 02/08/2014   Procedure: HYSTEROSCOPY;  Surgeon: Woodroe Mode, MD;  Location: [facility];  Service: Gynecology;  Laterality: N/A;   HYSTEROSCOPY WITH NOVASURE N/A 02/08/2014   Procedure: ATTEMPTED NOVASURE ABLATION;  Surgeon: Woodroe Mode, MD;  Location: [facility];  Service: Gynecology;  Laterality: N/A;   INTRAUTERINE DEVICE INSERTION     07/19/2013   SEPTOPLASTY N/A 09/08/2013   Procedure: SEPTOPLASTY;  Surgeon: Ruby Cola, MD;  Location: [facility];  Service: ENT;  Laterality: N/A;   SPINE SURGERY     TONSILLECTOMY     TONSILLECTOMY AND ADENOIDECTOMY Bilateral 09/08/2013   Procedure: TONSILLECTOMY ;  Surgeon: Ruby Cola, MD;  Location: [facility];  Service: ENT;  Laterality: Bilateral;   TUBAL LIGATION     VAGINAL HYSTERECTOMY N/A 04/12/2014   Procedure: LAPAROSCOPIC ASSISTED VAGINAL HYSTERECTOMY;  Surgeon: Woodroe Mode, MD;  Location: [facility];  Service: Gynecology;  Laterality: N/A;   WISDOM TOOTH  EXTRACTION      Social History   Socioeconomic History   Marital status: Married    Spouse name: Not on file   Number of children: 3   Years of education: Not on file   Highest education level: Not on file  Occupational History   Not on file  Tobacco Use   Smoking status: Former    Packs/day: 0.25    Types: Cigarettes    Quit date: 04/24/2020    Years since quitting: 1.3   Smokeless tobacco: Never   Tobacco comments:    4-5 cigarettes a day  Vaping Use   Vaping Use: Never used  Substance and Sexual Activity   Alcohol use: No   Drug use: No   Sexual activity: Not on file  Other Topics Concern   Not on file  Social History Narrative   Not on file   Social Determinants of  Health   Financial Resource Strain: Not on file  Food Insecurity: Not on file  Transportation Needs: Not on file  Physical Activity: Not on file  Stress: Not on file  Social Connections: Not on file  Intimate Partner Violence: Not on file     No Known Allergies   Outpatient Medications Prior to Visit  Medication Sig Dispense Refill   ACCU-CHEK FASTCLIX LANCETS MISC USE TO CHECK BLOOD SUGAR TWICE DAILY  11   ACCU-CHEK GUIDE test strip 1 each by Other route 3 (three) times daily. Use as instructed 270 each 3   albuterol (PROVENTIL) (2.5 MG/3ML) 0.083% nebulizer solution USE 1 VIAL IN NEBULIZER EVERY 6 HOURS AS NEEDED FOR WHEEZING OR SHORTNESS OF BREATH (Patient taking differently: Take 2.5 mg by nebulization every 6 (six) hours as needed for wheezing or shortness of breath.) 150 mL 0   benzonatate (TESSALON) 200 MG capsule Take 1 capsule (200 mg total) by mouth 2 (two) times daily as needed for cough. 20 capsule 0   carvedilol (COREG) 3.125 MG tablet Take 1 tablet (3.125 mg total) by mouth 2 (two) times daily with a meal. 60 tablet 1   cetirizine (ZYRTEC ALLERGY) 10 MG tablet Take 1 tablet (10 mg total) by mouth daily. 90 tablet 3   cyclobenzaprine (FLEXERIL) 5 MG tablet Take 1 tablet by mouth three times daily as needed for muscle spasm 90 tablet 0   Fluticasone-Salmeterol (ADVAIR DISKUS) 250-50 MCG/DOSE AEPB Inhale 1 puff into the lungs 2 (two) times daily. 60 each 11   gabapentin (NEURONTIN) 800 MG tablet Take 1 tablet (800 mg total) by mouth 3 (three) times daily. 270 tablet 0   ibuprofen (ADVIL) 800 MG tablet Take 800 mg by mouth every 8 (eight) hours as needed for moderate pain.     insulin glargine (LANTUS) 100 UNIT/ML Solostar Pen Inject 20 Units into the skin at bedtime. 15 mL 11   Insulin Pen Needle (PEN NEEDLES 31GX5/16") 31G X 8 MM MISC 1 pen by Does not apply route at bedtime. 100 each 2   linaclotide (LINZESS) 290 MCG CAPS capsule Take 1 capsule (290 mcg total) by mouth daily  before breakfast. 30 capsule 0   naloxone (NARCAN) nasal spray 4 mg/0.1 mL Place 1 spray into the nose as needed.     omeprazole (PRILOSEC) 20 MG capsule Take 20 mg by mouth at bedtime.     Oxycodone HCl 10 MG TABS Take 10 mg by mouth every 4 (four) hours as needed (pain).     promethazine (PHENERGAN) 25 MG tablet Take 25 mg by  mouth every 8 (eight) hours as needed for nausea or vomiting.     QUEtiapine (SEROQUEL) 400 MG tablet Take 400 mg by mouth at bedtime as needed (sleep).     torsemide (DEMADEX) 20 MG tablet Take 2 tablets (40 mg total) by mouth 2 (two) times daily. 120 tablet 1   vitamin B-12 (CYANOCOBALAMIN) 1000 MCG tablet Take 1,000 mcg by mouth daily.     vitamin C (ASCORBIC ACID) 500 MG tablet Take 500 mg by mouth daily.     No facility-administered medications prior to visit.       Objective:   Physical Exam:  General appearance: 51 y.o., female, NAD, conversant  Eyes: anicteric sclerae; PERRL, tracking appropriately HENT: NCAT; MMM Neck: Trachea midline; no lymphadenopathy, no JVD Lungs: CTAB, no crackles, no wheeze, with normal respiratory effort CV: RRR, no murmur  Abdomen: Soft, non-tender; non-distended, BS present  Extremities: No peripheral edema, warm Skin: Normal turgor and texture; no rash Psych: Appropriate affect Neuro: Alert and oriented to person and place, no focal deficit     There were no vitals filed for this visit.   on *** LPM *** RA BMI Readings from Last 3 Encounters:  06/12/21 76.55 kg/m  05/24/21 75.75 kg/m  05/14/21 79.54 kg/m   Wt Readings from Last 3 Encounters:  06/12/21 (!) 460 lb (208.7 kg)  05/24/21 (!) 455 lb 3.2 oz (206.5 kg)  05/14/21 (!) 478 lb (216.8 kg)     CBC    Component Value Date/Time   WBC 6.7 05/20/2021 0323   RBC 6.32 (H) 05/20/2021 0323   HGB 15.6 (H) 05/20/2021 0323   HGB 15.8 05/14/2021 1246   HGB 13.3 09/12/2014 1512   HCT 49.8 (H) 05/20/2021 0323   HCT 48.9 (H) 05/17/2021 0819   HCT 42.4  09/12/2014 1512   PLT 245 05/20/2021 0323   PLT 233 05/14/2021 1246   MCV 78.8 (L) 05/20/2021 0323   MCV 77 (L) 05/14/2021 1246   MCV 64.6 (L) 09/12/2014 1512   MCH 24.7 (L) 05/20/2021 0323   MCHC 31.3 05/20/2021 0323   RDW 18.1 (H) 05/20/2021 0323   RDW 17.1 (H) 05/14/2021 1246   RDW 21.6 (H) 09/12/2014 1512   LYMPHSABS 1.1 05/16/2021 1100   LYMPHSABS 1.2 05/14/2021 1246   LYMPHSABS 2.4 09/12/2014 1512   MONOABS 0.9 05/16/2021 1100   MONOABS 0.6 09/12/2014 1512   EOSABS 0.0 05/16/2021 1100   EOSABS 0.3 05/14/2021 1246   BASOSABS 0.1 05/16/2021 1100   BASOSABS 0.0 05/14/2021 1246   BASOSABS 0.1 09/12/2014 1512    ***  Chest Imaging: CXR 04/2021 reviewed by me underpenetrated  Pulmonary Functions Testing Results: PFT Results Latest Ref Rng & Units 05/26/2020  FVC-Pre L 2.35  FVC-Predicted Pre % 78  FVC-Post L 2.45  FVC-Predicted Post % 81  Pre FEV1/FVC % % 88  Post FEV1/FCV % % 92  FEV1-Pre L 2.07  FEV1-Predicted Pre % 85  FEV1-Post L 2.25  DLCO uncorrected ml/min/mmHg 19.80  DLCO UNC% % 90  DLCO corrected ml/min/mmHg 19.80  DLCO COR %Predicted % 90  DLVA Predicted % 133  Mild restriction, normal DLCO    Echocardiogram:  TTE 09/2020 normal      Assessment & Plan:    Plan:      Maryjane Hurter, MD [facility] 08/20/2021 12:06 PM

## 2021-08-21 ENCOUNTER — Ambulatory Visit: Payer: Medicaid Other | Admitting: Student

## 2021-08-24 ENCOUNTER — Encounter (HOSPITAL_COMMUNITY): Payer: Medicaid Other | Admitting: Internal Medicine

## 2021-08-24 NOTE — Progress Notes (Incomplete)
?Advanced Heart Failure Clinic Note  ? ?Referring Physician: ?PCP: [name], NP ?PCP-Cardiologist: None  ? ?HPI: ? ?51 y.o. female referred by [name], NP for chronic diastolic CHF.  Has history of chronic diastolic CHF, HTN, morbid obesity, type II DM, HLD tobacco use, asthma, chronic respiratory failure with hypoxia on supplemental O2.  ? ?She's had multiple admissions over the least few years acute on chronic respiratory failure with hypoxia secondary to acute on chronic diastolic CHF.  ? ?Echo 04/22: EF 60-65%, RV not well visualized, study technically difficult d/t body habitus. ? ?Most recently admitted 12/22 with acute on chronic respiratory failure with hypoxia secondary to influenza A pneumonia and a/c CHF. Diuresed with IV lasix. Discharged on 40 mg Torsemide daily. Course complicated by encephalopathy felt to be likely d/t CO2 narcosis vs narcotic use. ? ?Saw her PCP for f/u 06/12/21. Noted 20 lb weight gain after discharge. Torsemide increased to 40 mg BID. She was subsequently referred to [facility] for evaluation. ? ? ?Review of Systems: [y] = yes, [ ]  = no  ? ?General: Weight gain [ ] ; Weight loss [ ] ; Anorexia [ ] ; Fatigue [ ] ; Fever [ ] ; Chills [ ] ; Weakness [ ]   ?Cardiac: Chest pain/pressure [ ] ; Resting SOB [ ] ; Exertional SOB [ ] ; Orthopnea [ ] ; Pedal Edema [ ] ; Palpitations [ ] ; Syncope [ ] ; Presyncope [ ] ; Paroxysmal nocturnal dyspnea[ ]   ?Pulmonary: Cough [ ] ; Wheezing[ ] ; Hemoptysis[ ] ; Sputum [ ] ; Snoring [ ]   ?GI: Vomiting[ ] ; Dysphagia[ ] ; Melena[ ] ; Hematochezia [ ] ; Heartburn[ ] ; Abdominal pain [ ] ; Constipation [ ] ; Diarrhea [ ] ; BRBPR [ ]   ?GU: Hematuria[ ] ; Dysuria [ ] ; Nocturia[ ]   ?Vascular: Pain in legs with walking [ ] ; Pain in feet with lying flat [ ] ; Non-healing sores [ ] ; Stroke [ ] ; TIA [ ] ; Slurred speech [ ] ;  ?Neuro: Headaches[ ] ; Vertigo[ ] ; Seizures[ ] ; Paresthesias[ ] ;Blurred vision [ ] ; Diplopia [ ] ; Vision changes [ ]   ?Ortho/Skin:  Arthritis [ ] ; Joint pain [ ] ; Muscle pain [ ] ; Joint swelling [ ] ; Back Pain [ ] ; Rash [ ]   ?Psych: Depression[ ] ; Anxiety[ ]   ?Heme: Bleeding problems [ ] ; Clotting disorders [ ] ; Anemia [ ]   ?Endocrine: Diabetes [ ] ; Thyroid dysfunction[ ]  ? ? ?Past Medical History:  ?Diagnosis Date  ? Anemia   ? iv iron treatment dec 2014/ see oncology for this last in dec 2014  ? Anxiety   ? Arthritis   ? knee, hands  ? Asthma   ? seasonal related/ albuterol used when uri  ? CHF (congestive heart failure) ([place])   ? CN (constipation) 09/14/2014  ? DEGENERATIVE DISC DISEASE, LUMBOSACRAL SPINE W/RADICULOPATHY 11/01/2009  ? Qualifier: Diagnosis of  By: Jorene Minors, Scott    ? Depression   ? Diabetes ([place])   ? H/O dizziness   ? Hx  ? Headache(784.0)   ? otc med prn  ? High blood pressure   ? HYPERLIPIDEMIA 02/13/2010  ? Qualifier: Diagnosis of  By: Jorene Minors, Scott    ? INSOMNIA 08/24/2008  ? Qualifier: Diagnosis of  By: Radene Ou MD, [place]    ? Morbid obesity ([place]) 05/24/2008  ? Qualifier: Diagnosis of  By: Radene Ou MD, [place]    ? SVD (spontaneous vaginal delivery)   ? x 3  ? ? ?Current Outpatient Medications  ?Medication Sig Dispense Refill  ? ACCU-CHEK FASTCLIX LANCETS MISC USE TO CHECK BLOOD SUGAR TWICE  DAILY  11  ? ACCU-CHEK GUIDE test strip 1 each by Other route 3 (three) times daily. Use as instructed 270 each 3  ? albuterol (PROVENTIL) (2.5 MG/3ML) 0.083% nebulizer solution USE 1 VIAL IN NEBULIZER EVERY 6 HOURS AS NEEDED FOR WHEEZING OR SHORTNESS OF BREATH (Patient taking differently: Take 2.5 mg by nebulization every 6 (six) hours as needed for wheezing or shortness of breath.) 150 mL 0  ? benzonatate (TESSALON) 200 MG capsule Take 1 capsule (200 mg total) by mouth 2 (two) times daily as needed for cough. 20 capsule 0  ? carvedilol (COREG) 3.125 MG tablet Take 1 tablet (3.125 mg total) by mouth 2 (two) times daily with a meal. 60 tablet 1  ? cetirizine (ZYRTEC ALLERGY) 10 MG tablet Take 1 tablet (10 mg total) by mouth daily.  90 tablet 3  ? cyclobenzaprine (FLEXERIL) 5 MG tablet Take 1 tablet by mouth three times daily as needed for muscle spasm 90 tablet 0  ? Fluticasone-Salmeterol (ADVAIR DISKUS) 250-50 MCG/DOSE AEPB Inhale 1 puff into the lungs 2 (two) times daily. 60 each 11  ? gabapentin (NEURONTIN) 800 MG tablet Take 1 tablet (800 mg total) by mouth 3 (three) times daily. 270 tablet 0  ? ibuprofen (ADVIL) 800 MG tablet Take 800 mg by mouth every 8 (eight) hours as needed for moderate pain.    ? insulin glargine (LANTUS) 100 UNIT/ML Solostar Pen Inject 20 Units into the skin at bedtime. 15 mL 11  ? Insulin Pen Needle (PEN NEEDLES 31GX5/16") 31G X 8 MM MISC 1 pen by Does not apply route at bedtime. 100 each 2  ? linaclotide (LINZESS) 290 MCG CAPS capsule Take 1 capsule (290 mcg total) by mouth daily before breakfast. 30 capsule 0  ? naloxone (NARCAN) nasal spray 4 mg/0.1 mL Place 1 spray into the nose as needed.    ? omeprazole (PRILOSEC) 20 MG capsule Take 20 mg by mouth at bedtime.    ? Oxycodone HCl 10 MG TABS Take 10 mg by mouth every 4 (four) hours as needed (pain).    ? promethazine (PHENERGAN) 25 MG tablet Take 25 mg by mouth every 8 (eight) hours as needed for nausea or vomiting.    ? QUEtiapine (SEROQUEL) 400 MG tablet Take 400 mg by mouth at bedtime as needed (sleep).    ? torsemide (DEMADEX) 20 MG tablet Take 2 tablets (40 mg total) by mouth 2 (two) times daily. 120 tablet 1  ? vitamin B-12 (CYANOCOBALAMIN) 1000 MCG tablet Take 1,000 mcg by mouth daily.    ? vitamin C (ASCORBIC ACID) 500 MG tablet Take 500 mg by mouth daily.    ? ?No current facility-administered medications for this visit.  ? ? ?No Known Allergies ? ?  ?Social History  ? ?Socioeconomic History  ? Marital status: Married  ?  Spouse name: Not on file  ? Number of children: 3  ? Years of education: Not on file  ? Highest education level: Not on file  ?Occupational History  ? Not on file  ?Tobacco Use  ? Smoking status: Former  ?  Packs/day: 0.25  ?  Types:  Cigarettes  ?  Quit date: 04/24/2020  ?  Years since quitting: 1.3  ? Smokeless tobacco: Never  ? Tobacco comments:  ?  4-5 cigarettes a day  ?Vaping Use  ? Vaping Use: Never used  ?Substance and Sexual Activity  ? Alcohol use: No  ? Drug use: No  ? Sexual activity: Not on file  ?  Other Topics Concern  ? Not on file  ?Social History Narrative  ? Not on file  ? ?Social Determinants of Health  ? ?Financial Resource Strain: Not on file  ?Food Insecurity: Not on file  ?Transportation Needs: Not on file  ?Physical Activity: Not on file  ?Stress: Not on file  ?Social Connections: Not on file  ?Intimate Partner Violence: Not on file  ? ? ?  ?Family History  ?Problem Relation Age of Onset  ? Diabetes Mother   ? Hypertension Father   ? Heart disease Other   ? Diabetes Other   ? Hypertension Other   ? Alcohol abuse Other   ? Hypertension Brother   ? Asthma Brother   ? Hypertension Brother   ? Asthma Brother   ? COPD Maternal Grandmother   ? Lung cancer Maternal Grandmother   ? Asthma Paternal Grandmother   ? Asthma Paternal Aunt   ? ? ?There were no vitals filed for this visit. ? ? ?PHYSICAL EXAM: ?General:  Well appearing. No respiratory difficulty ?HEENT: normal ?Neck: supple. no JVD. Carotids 2+ bilat; no bruits. No lymphadenopathy or thyromegaly appreciated. ?Cor: PMI nondisplaced. Regular rate & rhythm. No rubs, gallops or murmurs. ?Lungs: clear ?Abdomen: soft, nontender, nondistended. No hepatosplenomegaly. No bruits or masses. Good bowel sounds. ?Extremities: no cyanosis, clubbing, rash, edema ?Neuro: alert & oriented x 3, cranial nerves grossly intact. moves all 4 extremities w/o difficulty. Affect pleasant. ? ?ECG: ? ? ?ASSESSMENT & PLAN: ? ? ? ? ?Scot Shiraishi N, PA-C ?08/24/21  ?

## 2021-08-27 ENCOUNTER — Ambulatory Visit: Payer: Medicaid Other | Admitting: Nurse Practitioner

## 2021-08-28 IMAGING — CR DG KNEE 1-2V*R*
2 series · 2 of 2 positions shown · non-contrast
Comparison: 03/13/2014, 11/11/2007.

CLINICAL DATA: Bilateral primary osteoarthritis of knee

EXAM:
LEFT KNEE - 1-2 VIEW; RIGHT KNEE - 1-2 VIEW

[t knee ap right]
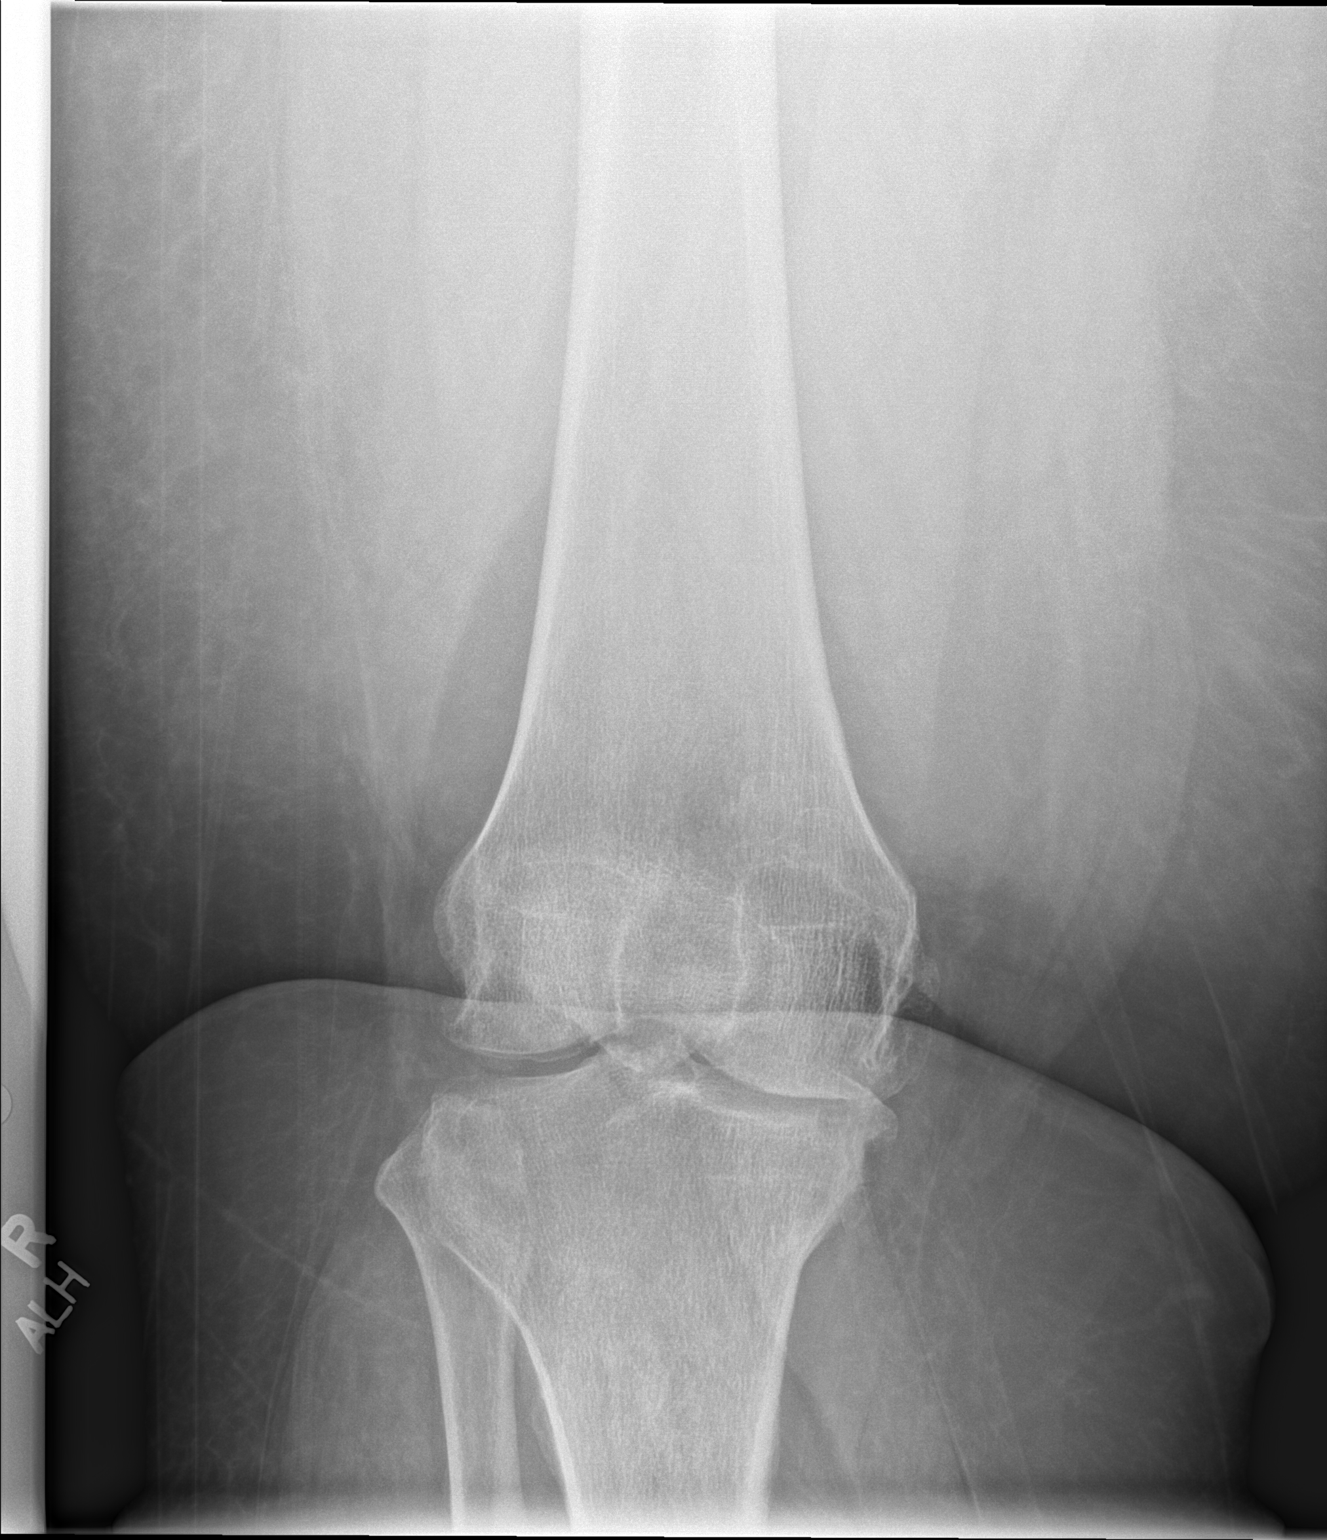

[x knee lat right]
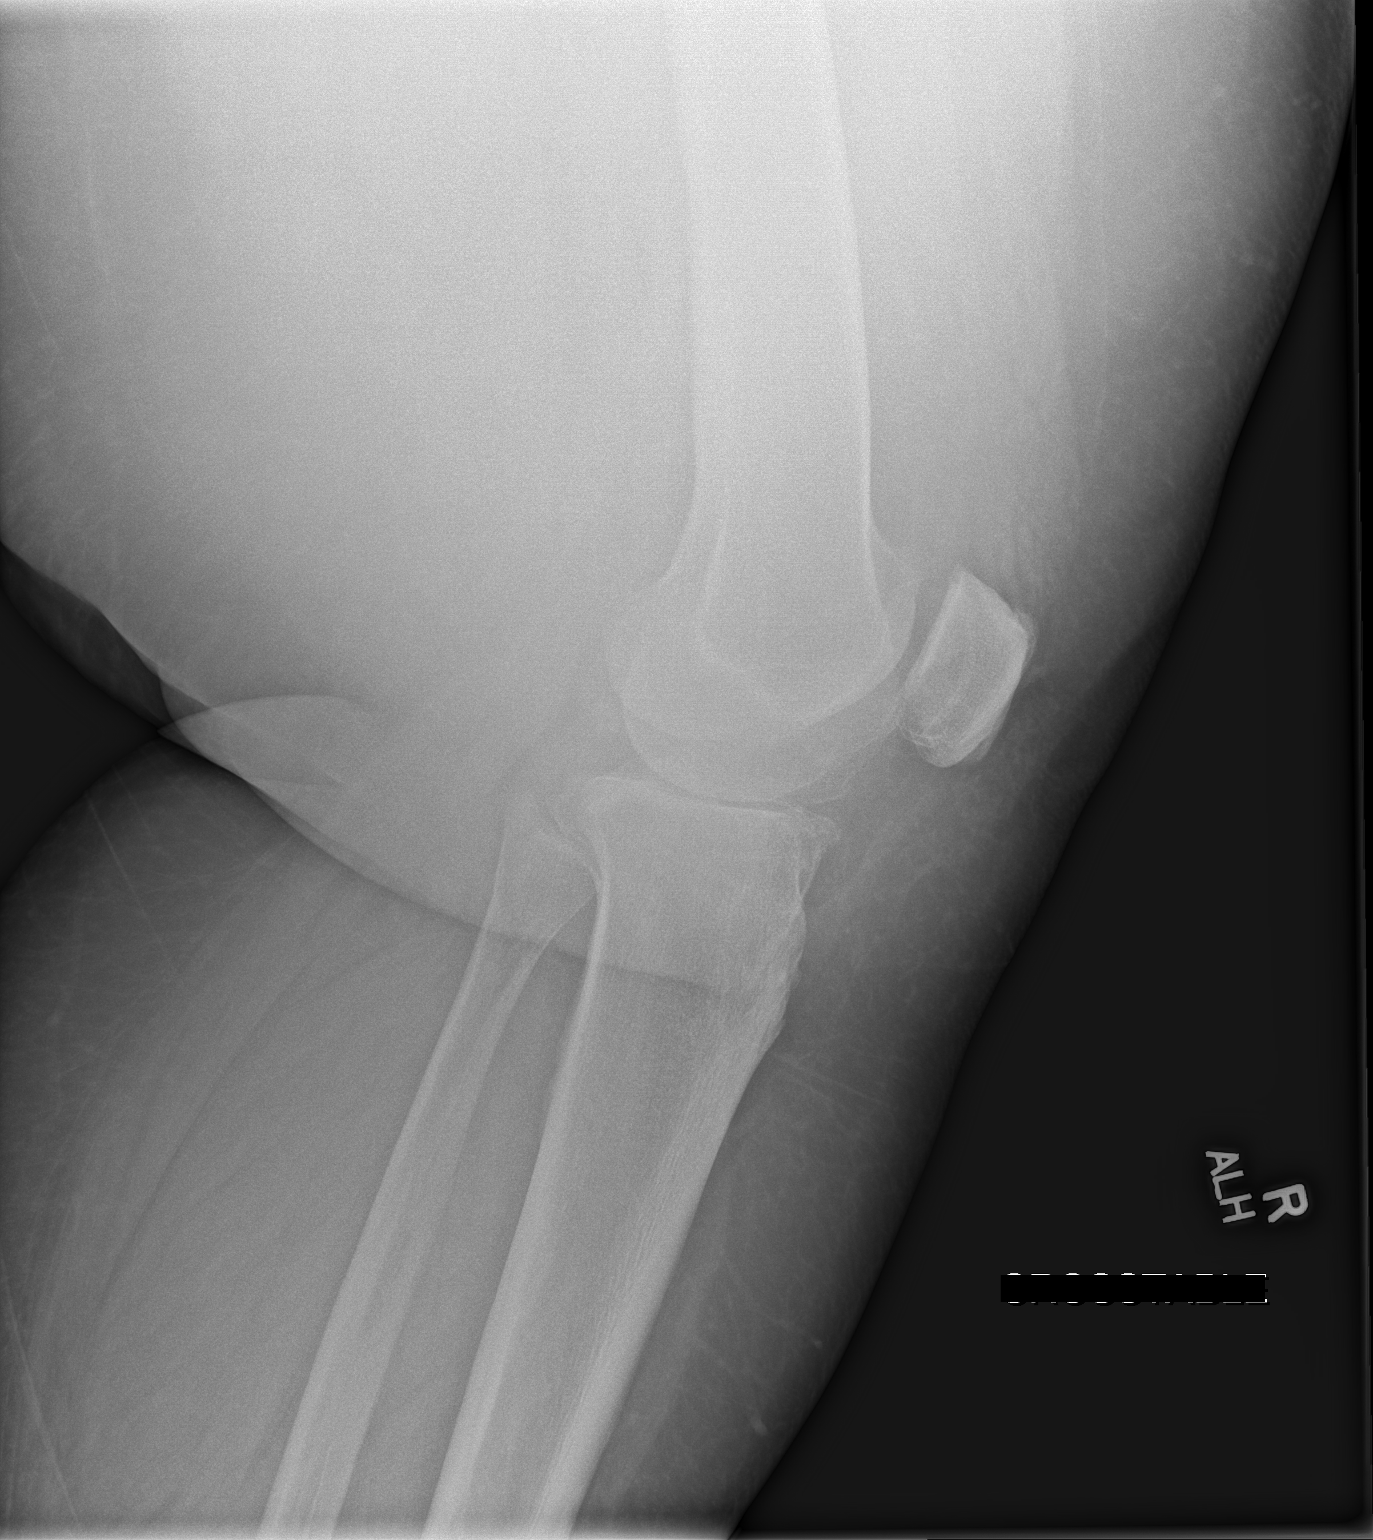

[2 of 2 positions shown; findings below may reference images not displayed]

FINDINGS: Right knee: Physiologic alignment. No fracture or focal osseous
lesion. Mild medial compartment joint space loss, progressed since
prior exam. Tricompartmental osteophytosis and subchondral
sclerosis. Lateral compartment chondrocalcinosis. Soft tissues
within normal limits.

Left knee: Physiologic alignment. No fracture or focal osseous
lesion. Mild-to-moderate medial compartment joint space loss.
Tricompartmental osteophytosis and subchondral sclerosis. Soft
tissues are within normal limits.
IMPRESSION: Interval progression of mild osteoarthrosis predominantly involving
the medial compartment.

Right knee lateral compartment chondrocalcinosis may reflect sequela
of CPPD.

## 2021-08-31 ENCOUNTER — Ambulatory Visit: Payer: Medicaid Other | Admitting: Nurse Practitioner

## 2021-08-31 ENCOUNTER — Ambulatory Visit (INDEPENDENT_AMBULATORY_CARE_PROVIDER_SITE_OTHER): Payer: Medicaid Other | Admitting: Nurse Practitioner

## 2021-08-31 ENCOUNTER — Other Ambulatory Visit: Payer: Self-pay

## 2021-08-31 ENCOUNTER — Encounter: Payer: Self-pay | Admitting: Nurse Practitioner

## 2021-08-31 VITALS — BP 124/76 | HR 80 | Temp 97.6°F

## 2021-08-31 DIAGNOSIS — L299 Pruritus, unspecified: Secondary | ICD-10-CM

## 2021-08-31 DIAGNOSIS — I10 Essential (primary) hypertension: Secondary | ICD-10-CM

## 2021-08-31 DIAGNOSIS — K5909 Other constipation: Secondary | ICD-10-CM

## 2021-08-31 DIAGNOSIS — Z6841 Body Mass Index (BMI) 40.0 and over, adult: Secondary | ICD-10-CM

## 2021-08-31 DIAGNOSIS — I5032 Chronic diastolic (congestive) heart failure: Secondary | ICD-10-CM

## 2021-08-31 DIAGNOSIS — E119 Type 2 diabetes mellitus without complications: Secondary | ICD-10-CM

## 2021-08-31 DIAGNOSIS — G894 Chronic pain syndrome: Secondary | ICD-10-CM

## 2021-08-31 LAB — POCT GLYCOSYLATED HEMOGLOBIN (HGB A1C)
HbA1c POC (<> result, manual entry): 6.9 % (ref 4.0–5.6)
HbA1c, POC (controlled diabetic range): 6.9 % (ref 0.0–7.0)
HbA1c, POC (prediabetic range): 6.9 % — AB (ref 5.7–6.4)
Hemoglobin A1C: 6.9 % — AB (ref 4.0–5.6)

## 2021-08-31 MED ORDER — HYDROXYZINE HCL 10 MG PO TABS: 10.0000 mg | ORAL_TABLET | Freq: Three times a day (TID) | ORAL | 0 refills | Status: DC | PRN

## 2021-08-31 MED ORDER — TORSEMIDE 20 MG PO TABS: 40.0000 mg | ORAL_TABLET | Freq: Two times a day (BID) | ORAL | 1 refills | Status: DC

## 2021-08-31 MED ORDER — CYCLOBENZAPRINE HCL 5 MG PO TABS: 5.0000 mg | ORAL_TABLET | Freq: Three times a day (TID) | ORAL | 0 refills | Status: DC | PRN

## 2021-08-31 MED ORDER — CARVEDILOL 3.125 MG PO TABS: 3.1250 mg | ORAL_TABLET | Freq: Two times a day (BID) | ORAL | 1 refills | Status: DC

## 2021-08-31 MED ORDER — LINACLOTIDE 290 MCG PO CAPS: 290.0000 ug | ORAL_CAPSULE | Freq: Every day | ORAL | 0 refills | Status: DC

## 2021-08-31 NOTE — Patient Instructions (Signed)
You were seen today in the [facility] for reevaluation of symptoms and weight . Labs were collected, results will be available via MyChart or, if abnormal, you will be contacted by clinic staff. You were prescribed medications, please take as directed. Please follow up in 6 mths for reevaluation.  ?

## 2021-08-31 NOTE — Progress Notes (Unsigned)
[place] [place], [place]  [address] Phone:  [phone]   Fax:  [phone] Subjective:   Patient ID: Cynthia Becker, female    DOB: 1970-07-07, 51 y.o.   MRN: [id number]  Chief Complaint  Patient presents with   Follow-up    Patient is here today for her follow up visit and to discuss the pain and swelling she is having in her legs and feet and itchiness on her legs.   HPI See Beharry 51 y.o. female  has a past medical history of Anemia, Anxiety, Arthritis, Asthma, CHF (congestive heart failure) ([place]), CN (constipation) (09/14/2014), DEGENERATIVE DISC DISEASE, LUMBOSACRAL SPINE W/RADICULOPATHY (11/01/2009), Depression, Diabetes ([place]), H/O dizziness, Headache(784.0), High blood pressure, HYPERLIPIDEMIA (02/13/2010), INSOMNIA (08/24/2008), Morbid obesity ([place]) (05/24/2008), and SVD (spontaneous vaginal delivery). To the [facility] for follow up visit and pain and swelling in her bilateral legs and feet. Also endorses itchiness of bilateral legs.   Continues to have worsening joint pain. Has been compliant with all medications. Has had worsening fluid retention. Currently taking half dose of seroquel. Was informed by pain management not to take oxycodone and seroquel close together. Seeing pain management for joint pain. States that steroid injections are helping.   Has had itching in BLE x 1 mth. Has been attempting to change diet at home.   Has follow up appointment to cardiology on 3/28. Has been wearing O2 at night with improved feeling  Diabetes Mellitus: Patient presents {diabetes rfv:14250::"for follow up of diabetes."} Symptoms: {dm sx:14075}. Symptoms have {Desc; symptom progression:19445}. Patient denies {dm sx:14075}.  Evaluation to date has been included: {dm labs:[phone]}.  Home sugars: {dm home sugars:14018}. Treatment to date: {dm interventions:14074}. At home BG 123 -129   Past Medical History:  Diagnosis Date   Anemia    iv iron treatment  dec 2014/ see oncology for this last in dec 2014   Anxiety    Arthritis    knee, hands   Asthma    seasonal related/ albuterol used when uri   CHF (congestive heart failure) ([facility])    CN (constipation) 09/14/2014   DEGENERATIVE DISC DISEASE, LUMBOSACRAL SPINE W/RADICULOPATHY 11/01/2009   Qualifier: Diagnosis of  By: Jorene Minors, Scott     Depression    Diabetes ([place])    H/O dizziness    Hx   Headache(784.0)    otc med prn   High blood pressure    HYPERLIPIDEMIA 02/13/2010   Qualifier: Diagnosis of  By: Jorene Minors, Scott     INSOMNIA 08/24/2008   Qualifier: Diagnosis of  By: Radene Ou MD, [place]     Morbid obesity ([place]) 05/24/2008   Qualifier: Diagnosis of  By: Radene Ou MD, [place]     SVD (spontaneous vaginal delivery)    x 3    Past Surgical History:  Procedure Laterality Date   ABDOMINAL HYSTERECTOMY  03/13/2014   total    BILATERAL SALPINGECTOMY Bilateral 04/12/2014   Procedure: BILATERAL SALPINGECTOMY;  Surgeon: Woodroe Mode, MD;  Location: [facility];  Service: Gynecology;  Laterality: Bilateral;   CARPAL TUNNEL RELEASE  08/30/2011   Procedure: CARPAL TUNNEL RELEASE;  Surgeon: Wynonia Sours, MD;  Location: [facility];  Service: Orthopedics;  Laterality: Right;   CHOLECYSTECTOMY  10/11   lap choli   GALLBLADDER SURGERY  04/04/2010   HYSTEROSCOPY N/A 02/08/2014   Procedure: HYSTEROSCOPY;  Surgeon: Woodroe Mode, MD;  Location: [facility];  Service: Gynecology;  Laterality: N/A;   HYSTEROSCOPY WITH NOVASURE  N/A 02/08/2014   Procedure: ATTEMPTED NOVASURE ABLATION;  Surgeon: Woodroe Mode, MD;  Location: [facility];  Service: Gynecology;  Laterality: N/A;   INTRAUTERINE DEVICE INSERTION     07/19/2013   SEPTOPLASTY N/A 09/08/2013   Procedure: SEPTOPLASTY;  Surgeon: Ruby Cola, MD;  Location: [facility];  Service: ENT;  Laterality: N/A;   SPINE SURGERY     TONSILLECTOMY     TONSILLECTOMY AND ADENOIDECTOMY Bilateral 09/08/2013   Procedure: TONSILLECTOMY ;  Surgeon: Ruby Cola, MD;  Location: [place];  Service: ENT;  Laterality: Bilateral;   TUBAL LIGATION     VAGINAL HYSTERECTOMY N/A 04/12/2014   Procedure: LAPAROSCOPIC ASSISTED VAGINAL HYSTERECTOMY;  Surgeon: Woodroe Mode, MD;  Location: [facility];  Service: Gynecology;  Laterality: N/A;   WISDOM TOOTH EXTRACTION      Family History  Problem Relation Age of Onset   Diabetes Mother    Hypertension Father    Heart disease Other    Diabetes Other    Hypertension Other    Alcohol abuse Other    Hypertension Brother    Asthma Brother    Hypertension Brother    Asthma Brother    COPD Maternal Grandmother    Lung cancer Maternal Grandmother    Asthma Paternal Grandmother    Asthma Paternal Aunt     Social History   Socioeconomic History   Marital status: Married    Spouse name: Not on file   Number of children: 3   Years of education: Not on file   Highest education level: Not on file  Occupational History   Not on file  Tobacco Use   Smoking status: Former    Packs/day: 0.25    Types: Cigarettes    Quit date: 04/24/2020    Years since quitting: 1.3   Smokeless tobacco: Never   Tobacco comments:    4-5 cigarettes a day  Vaping Use   Vaping Use: Never used  Substance and Sexual Activity   Alcohol use: No   Drug use: No   Sexual activity: Not on file  Other Topics Concern   Not on file  Social History Narrative   Not on file   Social Determinants of Health   Financial Resource Strain: Not on file  Food Insecurity: Not on file  Transportation Needs: Not on file  Physical Activity: Not on file  Stress: Not on file  Social Connections: Not on file  Intimate Partner Violence: Not on file    Outpatient Medications Prior to Visit  Medication Sig Dispense Refill   ACCU-CHEK FASTCLIX LANCETS MISC USE TO CHECK BLOOD SUGAR TWICE DAILY  11   ACCU-CHEK GUIDE test strip 1 each by Other route 3 (three) times daily. Use as instructed 270 each 3   albuterol (PROVENTIL) (2.5 MG/3ML) 0.083%  nebulizer solution USE 1 VIAL IN NEBULIZER EVERY 6 HOURS AS NEEDED FOR WHEEZING OR SHORTNESS OF BREATH (Patient taking differently: Take 2.5 mg by nebulization every 6 (six) hours as needed for wheezing or shortness of breath.) 150 mL 0   benzonatate (TESSALON) 200 MG capsule Take 1 capsule (200 mg total) by mouth 2 (two) times daily as needed for cough. 20 capsule 0   carvedilol (COREG) 3.125 MG tablet Take 1 tablet (3.125 mg total) by mouth 2 (two) times daily with a meal. 60 tablet 1   cetirizine (ZYRTEC ALLERGY) 10 MG tablet Take 1 tablet (10 mg total) by mouth daily. 90 tablet 3   cyclobenzaprine (FLEXERIL) 5 MG  tablet Take 1 tablet by mouth three times daily as needed for muscle spasm 90 tablet 0   Fluticasone-Salmeterol (ADVAIR DISKUS) 250-50 MCG/DOSE AEPB Inhale 1 puff into the lungs 2 (two) times daily. 60 each 11   gabapentin (NEURONTIN) 800 MG tablet Take 1 tablet (800 mg total) by mouth 3 (three) times daily. 270 tablet 0   ibuprofen (ADVIL) 800 MG tablet Take 800 mg by mouth every 8 (eight) hours as needed for moderate pain.     insulin glargine (LANTUS) 100 UNIT/ML Solostar Pen Inject 20 Units into the skin at bedtime. 15 mL 11   Insulin Pen Needle (PEN NEEDLES 31GX5/16") 31G X 8 MM MISC 1 pen by Does not apply route at bedtime. 100 each 2   linaclotide (LINZESS) 290 MCG CAPS capsule Take 1 capsule (290 mcg total) by mouth daily before breakfast. 30 capsule 0   naloxone (NARCAN) nasal spray 4 mg/0.1 mL Place 1 spray into the nose as needed.     omeprazole (PRILOSEC) 20 MG capsule Take 20 mg by mouth at bedtime.     Oxycodone HCl 10 MG TABS Take 10 mg by mouth every 4 (four) hours as needed (pain).     promethazine (PHENERGAN) 25 MG tablet Take 25 mg by mouth every 8 (eight) hours as needed for nausea or vomiting.     QUEtiapine (SEROQUEL) 400 MG tablet Take 400 mg by mouth at bedtime as needed (sleep).     torsemide (DEMADEX) 20 MG tablet Take 2 tablets (40 mg total) by mouth 2 (two)  times daily. 120 tablet 1   vitamin B-12 (CYANOCOBALAMIN) 1000 MCG tablet Take 1,000 mcg by mouth daily.     vitamin C (ASCORBIC ACID) 500 MG tablet Take 500 mg by mouth daily.     No facility-administered medications prior to visit.    No Known Allergies  ROS     Objective:    Physical Exam Cardiovascular:     Comments: Moderate to severe swelling of BLE   LMP 02/13/2014  Wt Readings from Last 3 Encounters:  06/12/21 (!) 460 lb (208.7 kg)  05/24/21 (!) 455 lb 3.2 oz (206.5 kg)  05/14/21 (!) 478 lb (216.8 kg)    Immunization History  Administered Date(s) Administered   Influenza Whole 04/25/2010   Influenza,inj,Quad PF,6+ Mos 03/07/2014, 03/10/2019   Pneumococcal Polysaccharide-23 04/13/2014   Td 08/02/2010    Diabetic Foot Exam - Simple   No data filed     Lab Results  Component Value Date   TSH 1.720 05/17/2021   Lab Results  Component Value Date   WBC 6.7 05/20/2021   HGB 15.6 (H) 05/20/2021   HCT 49.8 (H) 05/20/2021   MCV 78.8 (L) 05/20/2021   PLT 245 05/20/2021   Lab Results  Component Value Date   NA 135 05/25/2021   K 3.9 05/25/2021   CHLORIDE 107 04/07/2013   CO2 34 (H) 05/25/2021   GLUCOSE 119 (H) 05/25/2021   BUN 22 (H) 05/25/2021   CREATININE 0.79 05/25/2021   BILITOT 0.9 05/20/2021   ALKPHOS 58 05/20/2021   AST 71 (H) 05/20/2021   ALT 185 (H) 05/20/2021   PROT 7.6 05/20/2021   ALBUMIN 3.2 (L) 05/20/2021   CALCIUM 9.1 05/25/2021   ANIONGAP 5 05/25/2021   EGFR 67 05/14/2021   Lab Results  Component Value Date   CHOL 200 (H) 05/14/2021   CHOL 210 (H) 04/19/2020   CHOL 188 08/17/2010   Lab Results  Component Value Date  HDL 41 05/14/2021   HDL 43 04/19/2020   HDL 43 08/17/2010   Lab Results  Component Value Date   LDLCALC 141 (H) 05/14/2021   LDLCALC 151 (H) 04/19/2020   LDLCALC 127 (H) 08/17/2010   Lab Results  Component Value Date   TRIG 99 05/14/2021   TRIG 90 04/19/2020   TRIG 91 08/17/2010   Lab Results   Component Value Date   CHOLHDL 4.9 (H) 05/14/2021   CHOLHDL 4.9 (H) 04/19/2020   CHOLHDL 4.4 Ratio 08/17/2010   Lab Results  Component Value Date   HGBA1C 6.9 (H) 05/16/2021   HGBA1C 6.8 (H) 04/18/2021   HGBA1C 7.2 (H) 10/02/2020       Assessment & Plan:   Problem List Items Addressed This Visit   None   I am having Earnest Eastland maintain her Accu-Chek FastClix Lancets, Oxycodone HCl, Fluticasone-Salmeterol, insulin glargine, PEN NEEDLES 31GX5/16", ibuprofen, cetirizine, Accu-Chek Guide, albuterol, carvedilol, naloxone, promethazine, vitamin B-12, vitamin C, QUEtiapine, gabapentin, omeprazole, benzonatate, linaclotide, torsemide, and cyclobenzaprine.  No orders of the defined types were placed in this encounter.    Teena Dunk, NP

## 2021-09-03 ENCOUNTER — Other Ambulatory Visit: Payer: Self-pay | Admitting: Nurse Practitioner

## 2021-09-03 DIAGNOSIS — M79604 Pain in right leg: Secondary | ICD-10-CM

## 2021-09-03 DIAGNOSIS — M5126 Other intervertebral disc displacement, lumbar region: Secondary | ICD-10-CM

## 2021-09-21 ENCOUNTER — Encounter (HOSPITAL_COMMUNITY): Payer: Self-pay | Admitting: Internal Medicine

## 2021-09-21 ENCOUNTER — Ambulatory Visit (HOSPITAL_COMMUNITY)
Admission: RE | Admit: 2021-09-21 | Discharge: 2021-09-21 | Disposition: A | Discharge status: Home / Self Care | Payer: Medicaid Other | Source: Ambulatory Visit | Attending: Internal Medicine | Admitting: Internal Medicine

## 2021-09-21 ENCOUNTER — Other Ambulatory Visit (HOSPITAL_COMMUNITY): Payer: Self-pay | Admitting: *Deleted

## 2021-09-21 VITALS — BP 118/70 | HR 88 | Wt >= 6400 oz

## 2021-09-21 DIAGNOSIS — E785 Hyperlipidemia, unspecified: Secondary | ICD-10-CM | POA: Insufficient documentation

## 2021-09-21 DIAGNOSIS — I5033 Acute on chronic diastolic (congestive) heart failure: Secondary | ICD-10-CM | POA: Diagnosis not present

## 2021-09-21 DIAGNOSIS — R002 Palpitations: Secondary | ICD-10-CM | POA: Insufficient documentation

## 2021-09-21 DIAGNOSIS — J9611 Chronic respiratory failure with hypoxia: Secondary | ICD-10-CM | POA: Diagnosis not present

## 2021-09-21 DIAGNOSIS — I5032 Chronic diastolic (congestive) heart failure: Secondary | ICD-10-CM

## 2021-09-21 DIAGNOSIS — J45909 Unspecified asthma, uncomplicated: Secondary | ICD-10-CM | POA: Diagnosis not present

## 2021-09-21 DIAGNOSIS — I110 Hypertensive heart disease with heart failure: Secondary | ICD-10-CM | POA: Insufficient documentation

## 2021-09-21 DIAGNOSIS — E119 Type 2 diabetes mellitus without complications: Secondary | ICD-10-CM | POA: Diagnosis not present

## 2021-09-21 DIAGNOSIS — Z6841 Body Mass Index (BMI) 40.0 and over, adult: Secondary | ICD-10-CM | POA: Diagnosis not present

## 2021-09-21 MED ORDER — SPIRONOLACTONE 25 MG PO TABS: 12.5000 mg | ORAL_TABLET | Freq: Every day | ORAL | 3 refills | Status: DC

## 2021-09-21 MED ORDER — METOLAZONE 2.5 MG PO TABS: 2.5000 mg | ORAL_TABLET | ORAL | 3 refills | Status: DC

## 2021-09-21 MED ORDER — POTASSIUM CHLORIDE CRYS ER 20 MEQ PO TBCR: 40.0000 meq | EXTENDED_RELEASE_TABLET | ORAL | 3 refills | Status: DC

## 2021-09-21 NOTE — Patient Instructions (Signed)
Start Spironolactone 12.5 mg (1/2 tabs) Daily ? ?Start Metolazone 2.5 mg every Wednesday and Saturday ? ?Start Potassium 40 meq (2 tabs) every Wednesday and Saturday ? ?Your physician recommends that you schedule a follow-up appointment in: 3 weeks ? ?If you have any questions or concerns before your next appointment please send [place] a message through [place] or call our office at [phone].   ? ?TO LEAVE A MESSAGE FOR THE NURSE SELECT OPTION 2, PLEASE LEAVE A MESSAGE INCLUDING: ?YOUR NAME ?DATE OF BIRTH ?CALL BACK NUMBER ?REASON FOR CALL**this is important as we prioritize the call backs ? ?YOU WILL RECEIVE A CALL BACK THE SAME DAY AS LONG AS YOU CALL BEFORE 4:00 PM ? ?At the [facility], you and your health needs are our priority. As part of our continuing mission to provide you with exceptional heart care, we have created designated Provider Care Teams. These Care Teams include your primary Cardiologist (physician) and Advanced Practice Providers (APPs- Physician Assistants and Nurse Practitioners) who all work together to provide you with the care you need, when you need it.  ? ?You may see any of the following providers on your designated Care Team at your next follow up: ?Dr Glori Bickers ?Dr Loralie Champagne ?Darrick Grinder, NP ?Lyda Jester, PA ?Jessica Milford,NP ?Marlyce Huge, PA ?Audry Riles, PharmD ? ? ?Please be sure to bring in all your medications bottles to every appointment.  ? ? ?

## 2021-09-21 NOTE — Progress Notes (Signed)
? ?ADVANCED HF CLINIC CONSULT NOTE ? ?Referring Physician:Tewana Passmore NP  ?Primary Care: Dionisio David NP  ?Primary Cardiologist: Dr Virgina Jock  ? ?HPI: ?51 y.o. female referred by [name], NP for chronic diastolic CHF.  Has history of chronic diastolic CHF, HTN, morbid obesity, type II DM, HLD tobacco use, asthma, chronic respiratory failure with hypoxia on supplemental O2.  ?  ?She's had multiple admissions over the least few years acute on chronic respiratory failure with hypoxia secondary to acute on chronic diastolic CHF.  ?  ?Echo 04/22: EF 60-65%, RV not well visualized, study technically difficult d/t body habitus. ?  ?Most recently admitted 12/22 with acute on chronic respiratory failure with hypoxia secondary to influenza A pneumonia and a/c CHF. Diuresed with IV lasix. Discharged on 40 mg Torsemide daily. Course complicated by encephalopathy felt to be likely d/t CO2 narcosis vs narcotic use. ?  ?Saw her PCP for f/u 06/12/21. Noted 20 lb weight gain after discharge. Torsemide increased to 40 mg BID. She was subsequently referred to [facility] for evaluation. ? ?Complaining of knee pain. Limited by knee, hip, and back pain. Complaining chest fluttering 1-2 times per week. SOB with exertion.  Denies PND/Orthopnea. She has had 2 sleep studies which were negative. Uses 2 liters oxygen at night. Has a hard time taking a shower. She has assist devices. Appetite ok. Following low salt diet.  No fever or chills. Weight at home 458-465 pounds. She has been over 450 pounds for the last year. Taking all medications. Disabled Lives with husband and grandson.  ? ?Review of Systems: [y] = yes, '[ ]'$  = no  ? ?General: Weight gain [ Y]; Weight loss '[ ]'$ ; Anorexia '[ ]'$ ; Fatigue [Y ]; Fever '[ ]'$ ; Chills '[ ]'$ ; Weakness '[ ]'$   ?Cardiac: Chest pain/pressure '[ ]'$ ; Resting SOB '[ ]'$ ; Exertional SOB [Y ]; Orthopnea '[ ]'$ ; Pedal Edema '[ ]'$ ; Palpitations '[ ]'$ ; Syncope '[ ]'$ ; Presyncope '[ ]'$ ; Paroxysmal nocturnal  dyspnea'[ ]'$   ?Pulmonary: Cough '[ ]'$ ; Wheezing'[ ]'$ ; Hemoptysis'[ ]'$ ; Sputum '[ ]'$ ; Snoring '[ ]'$   ?GI: Vomiting'[ ]'$ ; Dysphagia'[ ]'$ ; Melena'[ ]'$ ; Hematochezia '[ ]'$ ; Heartburn'[ ]'$ ; Abdominal pain '[ ]'$ ; Constipation '[ ]'$ ; Diarrhea '[ ]'$ ; BRBPR '[ ]'$   ?GU: Hematuria'[ ]'$ ; Dysuria '[ ]'$ ; Nocturia'[ ]'$   ?Vascular: Pain in legs with walking [Y ]; Pain in feet with lying flat '[ ]'$ ; Non-healing sores '[ ]'$ ; Stroke '[ ]'$ ; TIA '[ ]'$ ; Slurred speech '[ ]'$ ;  ?Neuro: Headaches'[ ]'$ ; Vertigo'[ ]'$ ; Seizures'[ ]'$ ; Paresthesias'[ ]'$ ;Blurred vision '[ ]'$ ; Diplopia '[ ]'$ ; Vision changes '[ ]'$   ?Ortho/Skin: Arthritis '[ ]'$ ; Joint pain [ Y]; Muscle pain '[ ]'$ ; Joint swelling '[ ]'$ ; Back Pain [Y ]; Rash '[ ]'$   ?Psych: Depression'[ ]'$ ; Anxiety'[ ]'$   ?Heme: Bleeding problems '[ ]'$ ; Clotting disorders '[ ]'$ ; Anemia '[ ]'$   ?Endocrine: Diabetes [ Y]; Thyroid dysfunction'[ ]'$  ? ? ?Past Medical History:  ?Diagnosis Date  ? Anemia   ? iv iron treatment dec 2014/ see oncology for this last in dec 2014  ? Anxiety   ? Arthritis   ? knee, hands  ? Asthma   ? seasonal related/ albuterol used when uri  ? CHF (congestive heart failure) ([place])   ? CN (constipation) 09/14/2014  ? DEGENERATIVE DISC DISEASE, LUMBOSACRAL SPINE W/RADICULOPATHY 11/01/2009  ? Qualifier: Diagnosis of  By: Jorene Minors, Scott    ? Depression   ? Diabetes ([place])   ? H/O dizziness   ? Hx  ? Headache(784.0)   ?  otc med prn  ? High blood pressure   ? HYPERLIPIDEMIA 02/13/2010  ? Qualifier: Diagnosis of  By: Jorene Minors, Scott    ? INSOMNIA 08/24/2008  ? Qualifier: Diagnosis of  By: Radene Ou MD, [place]    ? Morbid obesity ([place]) 05/24/2008  ? Qualifier: Diagnosis of  By: Radene Ou MD, [place]    ? SVD (spontaneous vaginal delivery)   ? x 3  ? ? ?Current Outpatient Medications  ?Medication Sig Dispense Refill  ? ACCU-CHEK FASTCLIX LANCETS MISC USE TO CHECK BLOOD SUGAR TWICE DAILY  11  ? ACCU-CHEK GUIDE test strip 1 each by Other route 3 (three) times daily. Use as instructed 270 each 3  ? albuterol (PROVENTIL) (2.5 MG/3ML) 0.083% nebulizer solution USE 1 VIAL IN  NEBULIZER EVERY 6 HOURS AS NEEDED FOR WHEEZING OR SHORTNESS OF BREATH 150 mL 0  ? benzonatate (TESSALON) 200 MG capsule Take 1 capsule (200 mg total) by mouth 2 (two) times daily as needed for cough. 20 capsule 0  ? carvedilol (COREG) 3.125 MG tablet Take 1 tablet (3.125 mg total) by mouth 2 (two) times daily with a meal. 60 tablet 1  ? cetirizine (ZYRTEC ALLERGY) 10 MG tablet Take 1 tablet (10 mg total) by mouth daily. 90 tablet 3  ? cyclobenzaprine (FLEXERIL) 5 MG tablet Take 1 tablet (5 mg total) by mouth 3 (three) times daily as needed. for muscle spams 90 tablet 0  ? Fluticasone-Salmeterol (ADVAIR DISKUS) 250-50 MCG/DOSE AEPB Inhale 1 puff into the lungs 2 (two) times daily. 60 each 11  ? gabapentin (NEURONTIN) 800 MG tablet TAKE 1 TABLET BY MOUTH THREE TIMES DAILY 270 tablet 0  ? hydrOXYzine (ATARAX) 10 MG tablet Take 1 tablet (10 mg total) by mouth 3 (three) times daily as needed for itching. 30 tablet 0  ? ibuprofen (ADVIL) 800 MG tablet Take 800 mg by mouth every 8 (eight) hours as needed for moderate pain.    ? insulin glargine (LANTUS) 100 UNIT/ML Solostar Pen Inject 20 Units into the skin at bedtime. 15 mL 11  ? Insulin Pen Needle (PEN NEEDLES 31GX5/16") 31G X 8 MM MISC 1 pen by Does not apply route at bedtime. 100 each 2  ? linaclotide (LINZESS) 290 MCG CAPS capsule Take 1 capsule (290 mcg total) by mouth daily before breakfast. 30 capsule 0  ? naloxone (NARCAN) nasal spray 4 mg/0.1 mL Place 1 spray into the nose as needed.    ? omeprazole (PRILOSEC) 20 MG capsule Take 20 mg by mouth at bedtime.    ? Oxycodone HCl 10 MG TABS Take 10 mg by mouth every 4 (four) hours as needed (pain).    ? promethazine (PHENERGAN) 25 MG tablet Take 25 mg by mouth every 8 (eight) hours as needed for nausea or vomiting.    ? torsemide (DEMADEX) 20 MG tablet Take 2 tablets (40 mg total) by mouth 2 (two) times daily. 120 tablet 1  ? vitamin B-12 (CYANOCOBALAMIN) 1000 MCG tablet Take 1,000 mcg by mouth daily.    ? vitamin C  (ASCORBIC ACID) 500 MG tablet Take 500 mg by mouth daily.    ? ?No current facility-administered medications for this encounter.  ? ? ?No Known Allergies ? ?  ?Social History  ? ?Socioeconomic History  ? Marital status: Married  ?  Spouse name: Not on file  ? Number of children: 3  ? Years of education: Not on file  ? Highest education level: Not on file  ?Occupational History  ? Not  on file  ?Tobacco Use  ? Smoking status: Former  ?  Packs/day: 0.25  ?  Types: Cigarettes  ?  Quit date: 04/24/2020  ?  Years since quitting: 1.4  ? Smokeless tobacco: Never  ? Tobacco comments:  ?  4-5 cigarettes a day  ?Vaping Use  ? Vaping Use: Never used  ?Substance and Sexual Activity  ? Alcohol use: No  ? Drug use: No  ? Sexual activity: Not on file  ?Other Topics Concern  ? Not on file  ?Social History Narrative  ? Not on file  ? ?Social Determinants of Health  ? ?Financial Resource Strain: Not on file  ?Food Insecurity: Not on file  ?Transportation Needs: Not on file  ?Physical Activity: Not on file  ?Stress: Not on file  ?Social Connections: Not on file  ?Intimate Partner Violence: Not on file  ? ? ?  ?Family History  ?Problem Relation Age of Onset  ? Diabetes Mother   ? Hypertension Father   ? Heart disease Other   ? Diabetes Other   ? Hypertension Other   ? Alcohol abuse Other   ? Hypertension Brother   ? Asthma Brother   ? Hypertension Brother   ? Asthma Brother   ? COPD Maternal Grandmother   ? Lung cancer Maternal Grandmother   ? Asthma Paternal Grandmother   ? Asthma Paternal Aunt   ? ? ?Vitals:  ? 09/21/21 1450  ?BP: 118/70  ?Pulse: 88  ?SpO2: 94%  ?Weight: (!) 210.7 kg (464 lb 9.6 oz)  ? ?Wt Readings from Last 3 Encounters:  ?09/21/21 (!) 210.7 kg  ?06/12/21 (!) 208.7 kg  ?05/24/21 (!) 206.5 kg  ? ? ?PHYSICAL EXAM: ?General:  Obese. Arrived in wheel chair.  No respiratory difficulty ?HEENT: normal ?Neck: supple. JVP 8-9 . Carotids 2+ bilat; no bruits. No lymphadenopathy or thryomegaly appreciated. ?Cor: PMI nondisplaced.  Regular rate & rhythm. No rubs, gallops or murmurs. ?Lungs: clear ?Abdomen: soft, nontender, nondistended. No hepatosplenomegaly. No bruits or masses. Good bowel sounds. ?Extremities: no cyanosis, clubbing, r

## 2021-09-29 ENCOUNTER — Other Ambulatory Visit: Payer: Self-pay | Admitting: Nurse Practitioner

## 2021-09-29 DIAGNOSIS — G894 Chronic pain syndrome: Secondary | ICD-10-CM

## 2021-09-29 DIAGNOSIS — L299 Pruritus, unspecified: Secondary | ICD-10-CM

## 2021-10-11 NOTE — Progress Notes (Signed)
? ?ADVANCED HF CLINIC NOTE ? ? ?PCP: Dionisio David NP  ?Pulmonology: Dr. Carlis Abbott ?Primary Cardiologist: Dr Virgina Jock  ?HF Cardiologist: Dr. Haroldine Laws ? ?HPI: ? Cynthia Becker is a 51 y.o. female referred by [name], NP for chronic diastolic CHF.  Has history of chronic diastolic CHF, HTN, morbid obesity, type II DM, HLD tobacco use, asthma, chronic respiratory failure with hypoxia on supplemental O2.  ?  ?She's had multiple admissions over the last few years with a/c respiratory failure with hypoxia secondary to a/c diastolic CHF.  ?  ?Echo 04/22: EF 60-65%, RV not well visualized, study technically difficult d/t body habitus. ?  ?Admitted 12/22 with a/c respiratory failure with hypoxia secondary to influenza A pneumonia and a/c CHF. Diuresed with IV lasix. Discharged on torsemide 40 mg daily. Course complicated by encephalopathy, felt to be likely d/t CO2 narcosis vs narcotic use. ?  ?Saw her PCP for f/u 06/12/21. Noted 20 lb weight gain after discharge. Torsemide increased to 40 mg bid. She was subsequently referred to [facility] for evaluation. ? ?First seen in AHF clinic 09/21/21, markedly volume overloaded, weight 464 lbs. Spiro 12.5 added, and instructed to take metolazone 2x/week.  ? ?Today she returns for HF follow up with her husband and grandson. Multiple complaints today. Feels more SOB and has productive cough, used inhaler and breathing treatments last night which helped. She remains SOB with ADLs, says knee pain is limiting. Continues to feel palpitations and is fatigued. Has LE swelling. Denies CP, dizziness, abnormal bleeding or PND/Orthopnea. Appetite ok. Continues to drink > 2L fluid/day. No fever or chills. Does not weigh consistently, but last time weighing 456.5 lbs. Taking all medications. Uses 2L oxygen at night, had 2 negative sleep studies. She is disabled and lives with husband and grandson. Following low sodium diet. ? ?Cardiac Studies: ?- Echo (4/22): EF 60-65%, RV  not well visualized, study technically difficult d/t body habitus. ? ?- Echo (11/21): EF 60-65%, moderate LVH, grade II DD, RV normal ? ?- Echo (2/21): EF 50-55%, grade II DD, moderate LVH, technically difficult study. ? ?ROS: All systems reviewed and negative except as per HPI.  ? ?Past Medical History:  ?Diagnosis Date  ? Anemia   ? iv iron treatment dec 2014/ see oncology for this last in dec 2014  ? Anxiety   ? Arthritis   ? knee, hands  ? Asthma   ? seasonal related/ albuterol used when uri  ? CHF (congestive heart failure) ([place])   ? CN (constipation) 09/14/2014  ? DEGENERATIVE DISC DISEASE, LUMBOSACRAL SPINE W/RADICULOPATHY 11/01/2009  ? Qualifier: Diagnosis of  By: Jorene Minors, [place]    ? Depression   ? Diabetes ([place])   ? H/O dizziness   ? Hx  ? Headache(784.0)   ? otc med prn  ? High blood pressure   ? HYPERLIPIDEMIA 02/13/2010  ? Qualifier: Diagnosis of  By: Jorene Minors, [place]    ? INSOMNIA 08/24/2008  ? Qualifier: Diagnosis of  By: Radene Ou MD, [place]    ? Morbid obesity ([place]) 05/24/2008  ? Qualifier: Diagnosis of  By: Radene Ou MD, [place]    ? SVD (spontaneous vaginal delivery)   ? x 3  ? ?Current Outpatient Medications  ?Medication Sig Dispense Refill  ? ACCU-CHEK FASTCLIX LANCETS MISC USE TO CHECK BLOOD SUGAR TWICE DAILY  11  ? ACCU-CHEK GUIDE test strip 1 each by Other route 3 (three) times daily. Use as instructed 270 each 3  ? albuterol (PROVENTIL) (2.5 MG/3ML) 0.083%  nebulizer solution USE 1 VIAL IN NEBULIZER EVERY 6 HOURS AS NEEDED FOR WHEEZING OR SHORTNESS OF BREATH 150 mL 0  ? benzonatate (TESSALON) 200 MG capsule Take 1 capsule (200 mg total) by mouth 2 (two) times daily as needed for cough. 20 capsule 0  ? carvedilol (COREG) 3.125 MG tablet Take 1 tablet (3.125 mg total) by mouth 2 (two) times daily with a meal. 60 tablet 1  ? cetirizine (ZYRTEC ALLERGY) 10 MG tablet Take 1 tablet (10 mg total) by mouth daily. 90 tablet 3  ? cyclobenzaprine (FLEXERIL) 5 MG tablet Take 1 tablet by mouth three times  daily as needed for muscle spasm 90 tablet 0  ? Fluticasone-Salmeterol (ADVAIR DISKUS) 250-50 MCG/DOSE AEPB Inhale 1 puff into the lungs 2 (two) times daily. 60 each 11  ? gabapentin (NEURONTIN) 800 MG tablet TAKE 1 TABLET BY MOUTH THREE TIMES DAILY 270 tablet 0  ? hydrOXYzine (ATARAX) 10 MG tablet TAKE 1 TABLET BY MOUTH THREE TIMES DAILY AS NEEDED FOR ITCHING 30 tablet 0  ? ibuprofen (ADVIL) 800 MG tablet Take 800 mg by mouth every 8 (eight) hours as needed for moderate pain.    ? insulin glargine (LANTUS) 100 UNIT/ML Solostar Pen Inject 20 Units into the skin at bedtime. 15 mL 11  ? Insulin Pen Needle (PEN NEEDLES 31GX5/16") 31G X 8 MM MISC 1 pen by Does not apply route at bedtime. 100 each 2  ? linaclotide (LINZESS) 290 MCG CAPS capsule Take 1 capsule (290 mcg total) by mouth daily before breakfast. 30 capsule 0  ? metolazone (ZAROXOLYN) 2.5 MG tablet Take 1 tablet (2.5 mg total) by mouth 2 (two) times a week. On Wednesday and Saturday 20 tablet 3  ? naloxone (NARCAN) nasal spray 4 mg/0.1 mL Place 1 spray into the nose as needed.    ? omeprazole (PRILOSEC) 20 MG capsule Take 20 mg by mouth at bedtime.    ? Oxycodone HCl 10 MG TABS Take 10 mg by mouth every 4 (four) hours as needed (pain).    ? potassium chloride SA (KLOR-CON M) 20 MEQ tablet Take 2 tablets (40 mEq total) by mouth 2 (two) times a week. On Wednesday and Saturday with Metolazone 20 tablet 3  ? promethazine (PHENERGAN) 25 MG tablet Take 25 mg by mouth every 8 (eight) hours as needed for nausea or vomiting.    ? spironolactone (ALDACTONE) 25 MG tablet Take 0.5 tablets (12.5 mg total) by mouth daily. 15 tablet 3  ? torsemide (DEMADEX) 20 MG tablet Take 2 tablets (40 mg total) by mouth 2 (two) times daily. 120 tablet 1  ? vitamin B-12 (CYANOCOBALAMIN) 1000 MCG tablet Take 1,000 mcg by mouth daily.    ? vitamin C (ASCORBIC ACID) 500 MG tablet Take 500 mg by mouth daily.    ? ?No current facility-administered medications for this encounter.  ? ?No Known  Allergies ? ?Social History  ? ?Socioeconomic History  ? Marital status: Married  ?  Spouse name: Not on file  ? Number of children: 3  ? Years of education: Not on file  ? Highest education level: Not on file  ?Occupational History  ? Not on file  ?Tobacco Use  ? Smoking status: Former  ?  Packs/day: 0.25  ?  Types: Cigarettes  ?  Quit date: 04/24/2020  ?  Years since quitting: 1.4  ? Smokeless tobacco: Never  ? Tobacco comments:  ?  4-5 cigarettes a day  ?Vaping Use  ? Vaping Use: Never  used  ?Substance and Sexual Activity  ? Alcohol use: No  ? Drug use: No  ? Sexual activity: Not on file  ?Other Topics Concern  ? Not on file  ?Social History Narrative  ? Not on file  ? ?Social Determinants of Health  ? ?Financial Resource Strain: Not on file  ?Food Insecurity: Not on file  ?Transportation Needs: Not on file  ?Physical Activity: Not on file  ?Stress: Not on file  ?Social Connections: Not on file  ?Intimate Partner Violence: Not on file  ? ?Family History  ?Problem Relation Age of Onset  ? Diabetes Mother   ? Hypertension Father   ? Heart disease Other   ? Diabetes Other   ? Hypertension Other   ? Alcohol abuse Other   ? Hypertension Brother   ? Asthma Brother   ? Hypertension Brother   ? Asthma Brother   ? COPD Maternal Grandmother   ? Lung cancer Maternal Grandmother   ? Asthma Paternal Grandmother   ? Asthma Paternal Aunt   ? ?BP 118/80   Pulse 95   Wt (!) 205.9 kg (454 lb)   LMP 02/13/2014   SpO2 97%   BMI 75.55 kg/m?  ? ?Wt Readings from Last 3 Encounters:  ?10/15/21 (!) 205.9 kg (454 lb)  ?09/21/21 (!) 210.7 kg (464 lb 9.6 oz)  ?06/12/21 (!) 208.7 kg (460 lb)  ? ?PHYSICAL EXAM: ?General:  NAD. No resp difficulty, arrived in [facility] ?HEENT: Congested-sounding voice ?Neck: Supple. Thick neck. Carotids 2+ bilat; no bruits. No lymphadenopathy or thryomegaly appreciated. ?Cor: PMI nondisplaced. Regular rate & rhythm. No rubs, gallops or murmurs. ?Lungs: Clear ?Abdomen: Obese, soft, nontender, nondistended. No  hepatosplenomegaly. No bruits or masses. Good bowel sounds. ?Extremities: No cyanosis, clubbing, rash, 1-2+ BLE pre-tibial edema, chronic venous stasis changes to BLE. ?Neuro: Alert & oriented x 3, cranial nerve

## 2021-10-12 ENCOUNTER — Encounter (HOSPITAL_COMMUNITY): Payer: Medicaid Other

## 2021-10-15 ENCOUNTER — Encounter (HOSPITAL_COMMUNITY): Payer: Self-pay

## 2021-10-15 ENCOUNTER — Ambulatory Visit (HOSPITAL_COMMUNITY)
Admission: RE | Admit: 2021-10-15 | Discharge: 2021-10-15 | Disposition: A | Discharge status: Home / Self Care | Payer: Medicaid Other | Source: Ambulatory Visit | Attending: Family Medicine | Admitting: Family Medicine

## 2021-10-15 ENCOUNTER — Ambulatory Visit (HOSPITAL_COMMUNITY)
Admission: RE | Admit: 2021-10-15 | Discharge: 2021-10-15 | Disposition: A | Discharge status: Home / Self Care | Payer: Medicaid Other | Source: Ambulatory Visit | Attending: Internal Medicine | Admitting: Internal Medicine

## 2021-10-15 VITALS — BP 118/80 | HR 95 | Wt >= 6400 oz

## 2021-10-15 DIAGNOSIS — R002 Palpitations: Secondary | ICD-10-CM | POA: Diagnosis not present

## 2021-10-15 DIAGNOSIS — J45909 Unspecified asthma, uncomplicated: Secondary | ICD-10-CM | POA: Diagnosis not present

## 2021-10-15 DIAGNOSIS — Z7951 Long term (current) use of inhaled steroids: Secondary | ICD-10-CM | POA: Diagnosis not present

## 2021-10-15 DIAGNOSIS — M25569 Pain in unspecified knee: Secondary | ICD-10-CM | POA: Insufficient documentation

## 2021-10-15 DIAGNOSIS — Z87891 Personal history of nicotine dependence: Secondary | ICD-10-CM | POA: Insufficient documentation

## 2021-10-15 DIAGNOSIS — E785 Hyperlipidemia, unspecified: Secondary | ICD-10-CM | POA: Diagnosis not present

## 2021-10-15 DIAGNOSIS — M159 Polyosteoarthritis, unspecified: Secondary | ICD-10-CM | POA: Insufficient documentation

## 2021-10-15 DIAGNOSIS — R0600 Dyspnea, unspecified: Secondary | ICD-10-CM | POA: Diagnosis not present

## 2021-10-15 DIAGNOSIS — I5032 Chronic diastolic (congestive) heart failure: Secondary | ICD-10-CM | POA: Diagnosis not present

## 2021-10-15 DIAGNOSIS — Z9981 Dependence on supplemental oxygen: Secondary | ICD-10-CM | POA: Insufficient documentation

## 2021-10-15 DIAGNOSIS — R0602 Shortness of breath: Secondary | ICD-10-CM | POA: Insufficient documentation

## 2021-10-15 DIAGNOSIS — Z79899 Other long term (current) drug therapy: Secondary | ICD-10-CM | POA: Diagnosis not present

## 2021-10-15 DIAGNOSIS — G4734 Idiopathic sleep related nonobstructive alveolar hypoventilation: Secondary | ICD-10-CM

## 2021-10-15 DIAGNOSIS — I110 Hypertensive heart disease with heart failure: Secondary | ICD-10-CM | POA: Insufficient documentation

## 2021-10-15 DIAGNOSIS — Z794 Long term (current) use of insulin: Secondary | ICD-10-CM | POA: Diagnosis not present

## 2021-10-15 DIAGNOSIS — E119 Type 2 diabetes mellitus without complications: Secondary | ICD-10-CM

## 2021-10-15 DIAGNOSIS — Z6841 Body Mass Index (BMI) 40.0 and over, adult: Secondary | ICD-10-CM | POA: Diagnosis not present

## 2021-10-15 DIAGNOSIS — M1990 Unspecified osteoarthritis, unspecified site: Secondary | ICD-10-CM

## 2021-10-15 DIAGNOSIS — Z7901 Long term (current) use of anticoagulants: Secondary | ICD-10-CM | POA: Diagnosis not present

## 2021-10-15 LAB — BASIC METABOLIC PANEL
Anion gap: 12 (ref 5–15)
BUN: 33 mg/dL — ABNORMAL HIGH (ref 6–20)
CO2: 35 mmol/L — ABNORMAL HIGH (ref 22–32)
Calcium: 9.5 mg/dL (ref 8.9–10.3)
Chloride: 89 mmol/L — ABNORMAL LOW (ref 98–111)
Creatinine, Ser: 1.38 mg/dL — ABNORMAL HIGH (ref 0.44–1.00)
GFR, Estimated: 46 mL/min — ABNORMAL LOW (ref >60)
Glucose, Bld: 120 mg/dL — ABNORMAL HIGH (ref 70–99)
Potassium: 3.6 mmol/L (ref 3.5–5.1)
Sodium: 136 mmol/L (ref 135–145)

## 2021-10-15 LAB — BRAIN NATRIURETIC PEPTIDE: B Natriuretic Peptide: 6.2 pg/mL (ref 0.0–100.0)

## 2021-10-15 MED ORDER — POTASSIUM CHLORIDE CRYS ER 20 MEQ PO TBCR: 20.0000 meq | EXTENDED_RELEASE_TABLET | Freq: Every day | ORAL | 3 refills | Status: DC

## 2021-10-15 MED ORDER — TORSEMIDE 20 MG PO TABS: 60.0000 mg | ORAL_TABLET | Freq: Two times a day (BID) | ORAL | 1 refills | Status: DC

## 2021-10-15 MED ORDER — SPIRONOLACTONE 25 MG PO TABS: 25.0000 mg | ORAL_TABLET | Freq: Every day | ORAL | 3 refills | Status: DC

## 2021-10-15 NOTE — Patient Instructions (Addendum)
INCREASE Spironolactone to 25 mg, one tab daily ?DECEASE Potassium to 20 meq one tab daily ?INCREASE Torsemide to 60 mg (3 tabs) daily ? ?Labs today ?We will only contact you if something comes back abnormal or we need to make some changes. ?Otherwise no news is good news! ? ?Labs needed in 7-10 days ? ?You have been referred to [facility] ?-they will be in contact with an appointment ? ?Your provider has recommended that  you wear a Zio Patch for 14 days.  This monitor will record your heart rhythm for our review.  IF you have any symptoms while wearing the monitor please press the button.  If you have any issues with the patch or you notice a red or orange light on it please call the company at [phone].  Once you remove the patch please mail it back to the company as soon as possible so we can get the results. ? ? ?Your physician recommends that you schedule a follow-up appointment in: 3-4 weeks  in the Advanced Practitioners (PA/NP) Clinic  ? ? ?Do the following things EVERYDAY: ?Weigh yourself in the morning before breakfast. Write it down and keep it in a log. ?Take your medicines as prescribed ?Eat low salt foods--Limit salt (sodium) to 2000 mg per day.  ?Stay as active as you can everyday ?Limit all fluids for the day to less than 2 liters ? ?At the [facility], you and your health needs are our priority. As part of our continuing mission to provide you with exceptional heart care, we have created designated Provider Care Teams. These Care Teams include your primary Cardiologist (physician) and Advanced Practice Providers (APPs- Physician Assistants and Nurse Practitioners) who all work together to provide you with the care you need, when you need it.  ? ?You may see any of the following providers on your designated Care Team at your next follow up: ?Dr Glori Bickers ?Dr Loralie Champagne ?Darrick Grinder, NP ?Lyda Jester, PA ?Jessica Milford,NP ?Marlyce Huge, PA ?Audry Riles, PharmD ? ? ?Please be sure to bring in all your medications bottles to every appointment.  ? ?If you have any questions or concerns before your next appointment please send [place] a message through [place] or call our office at [phone].   ? ?TO LEAVE A MESSAGE FOR THE NURSE SELECT OPTION 2, PLEASE LEAVE A MESSAGE INCLUDING: ?YOUR NAME ?DATE OF BIRTH ?CALL BACK NUMBER ?REASON FOR CALL**this is important as we prioritize the call backs ? ?YOU WILL RECEIVE A CALL BACK THE SAME DAY AS LONG AS YOU CALL BEFORE 4:00 PM ? ? ?

## 2021-10-15 NOTE — Addendum Note (Signed)
Encounter addended by: Kerry Dory, CMA on: 10/15/2021 4:50 PM ? Actions taken: Imaging Exam begun

## 2021-10-16 ENCOUNTER — Ambulatory Visit (HOSPITAL_COMMUNITY): Payer: Medicaid Other

## 2021-10-22 ENCOUNTER — Ambulatory Visit (HOSPITAL_COMMUNITY)
Admission: RE | Admit: 2021-10-22 | Discharge: 2021-10-22 | Disposition: A | Discharge status: Home / Self Care | Payer: Medicaid Other | Source: Ambulatory Visit | Attending: Internal Medicine | Admitting: Internal Medicine

## 2021-10-22 DIAGNOSIS — I5032 Chronic diastolic (congestive) heart failure: Secondary | ICD-10-CM | POA: Diagnosis not present

## 2021-10-22 LAB — BASIC METABOLIC PANEL
Anion gap: 9 (ref 5–15)
BUN: 37 mg/dL — ABNORMAL HIGH (ref 6–20)
CO2: 35 mmol/L — ABNORMAL HIGH (ref 22–32)
Calcium: 9.7 mg/dL (ref 8.9–10.3)
Chloride: 93 mmol/L — ABNORMAL LOW (ref 98–111)
Creatinine, Ser: 1.3 mg/dL — ABNORMAL HIGH (ref 0.44–1.00)
GFR, Estimated: 50 mL/min — ABNORMAL LOW (ref >60)
Glucose, Bld: 134 mg/dL — ABNORMAL HIGH (ref 70–99)
Potassium: 4 mmol/L (ref 3.5–5.1)
Sodium: 137 mmol/L (ref 135–145)

## 2021-11-05 ENCOUNTER — Other Ambulatory Visit: Payer: Self-pay | Admitting: Nurse Practitioner

## 2021-11-05 ENCOUNTER — Encounter (HOSPITAL_COMMUNITY): Payer: Medicaid Other

## 2021-11-05 DIAGNOSIS — G894 Chronic pain syndrome: Secondary | ICD-10-CM

## 2021-11-05 DIAGNOSIS — I10 Essential (primary) hypertension: Secondary | ICD-10-CM

## 2021-11-05 NOTE — Progress Notes (Incomplete)
? ?ADVANCED HF CLINIC NOTE ? ? ?PCP: Dionisio David NP  ?Pulmonology: Dr. Carlis Abbott ?Primary Cardiologist: Dr Virgina Jock  ?HF Cardiologist: Dr. Haroldine Laws ? ?HPI: ? Cynthia Becker is a 51 y.o. female referred by [name], NP for chronic diastolic CHF.  Has history of chronic diastolic CHF, HTN, morbid obesity, type II DM, HLD tobacco use, asthma, chronic respiratory failure with hypoxia on supplemental O2.  ?  ?She's had multiple admissions over the last few years with a/c respiratory failure with hypoxia secondary to a/c diastolic CHF.  ?  ?Echo 04/22: EF 60-65%, RV not well visualized, study technically difficult d/t body habitus. ?  ?Admitted 12/22 with a/c respiratory failure with hypoxia secondary to influenza A pneumonia and a/c CHF. Diuresed with IV lasix. Discharged on torsemide 40 mg daily. Course complicated by encephalopathy, felt to be likely d/t CO2 narcosis vs narcotic use. ?  ?Saw her PCP for f/u 06/12/21. Noted 20 lb weight gain after discharge. Torsemide increased to 40 mg bid. She was subsequently referred to [facility] for evaluation. ? ?First seen in AHF clinic 09/21/21, markedly volume overloaded, weight 464 lbs. Spiro 12.5 added, and instructed to take metolazone 2x/week.  ? ?Today she returns for HF follow up with her husband and grandson. Multiple complaints today. Feels more SOB and has productive cough, used inhaler and breathing treatments last night which helped. She remains SOB with ADLs, says knee pain is limiting. Continues to feel palpitations and is fatigued. Has LE swelling. Denies CP, dizziness, abnormal bleeding or PND/Orthopnea. Appetite ok. Continues to drink > 2L fluid/day. No fever or chills. Does not weigh consistently, but last time weighing 456.5 lbs. Taking all medications. Uses 2L oxygen at night, had 2 negative sleep studies. She is disabled and lives with husband and grandson. Following low sodium diet. ? ?Cardiac Studies: ?- Echo (4/22): EF 60-65%, RV  not well visualized, study technically difficult d/t body habitus. ? ?- Echo (11/21): EF 60-65%, moderate LVH, grade II DD, RV normal ? ?- Echo (2/21): EF 50-55%, grade II DD, moderate LVH, technically difficult study. ? ?ROS: All systems reviewed and negative except as per HPI.  ? ?Past Medical History:  ?Diagnosis Date  ? Anemia   ? iv iron treatment dec 2014/ see oncology for this last in dec 2014  ? Anxiety   ? Arthritis   ? knee, hands  ? Asthma   ? seasonal related/ albuterol used when uri  ? CHF (congestive heart failure) ([place])   ? CN (constipation) 09/14/2014  ? DEGENERATIVE DISC DISEASE, LUMBOSACRAL SPINE W/RADICULOPATHY 11/01/2009  ? Qualifier: Diagnosis of  By: Jorene Minors, Scott    ? Depression   ? Diabetes ([place])   ? H/O dizziness   ? Hx  ? Headache(784.0)   ? otc med prn  ? High blood pressure   ? HYPERLIPIDEMIA 02/13/2010  ? Qualifier: Diagnosis of  By: Jorene Minors, Scott    ? INSOMNIA 08/24/2008  ? Qualifier: Diagnosis of  By: Radene Ou MD, [place]    ? Morbid obesity ([place]) 05/24/2008  ? Qualifier: Diagnosis of  By: Radene Ou MD, [place]    ? SVD (spontaneous vaginal delivery)   ? x 3  ? ?Current Outpatient Medications  ?Medication Sig Dispense Refill  ? ACCU-CHEK FASTCLIX LANCETS MISC USE TO CHECK BLOOD SUGAR TWICE DAILY  11  ? ACCU-CHEK GUIDE test strip 1 each by Other route 3 (three) times daily. Use as instructed 270 each 3  ? albuterol (PROVENTIL) (2.5 MG/3ML) 0.083%  nebulizer solution USE 1 VIAL IN NEBULIZER EVERY 6 HOURS AS NEEDED FOR WHEEZING OR SHORTNESS OF BREATH 150 mL 0  ? benzonatate (TESSALON) 200 MG capsule Take 1 capsule (200 mg total) by mouth 2 (two) times daily as needed for cough. 20 capsule 0  ? carvedilol (COREG) 3.125 MG tablet Take 1 tablet (3.125 mg total) by mouth 2 (two) times daily with a meal. 60 tablet 1  ? cetirizine (ZYRTEC ALLERGY) 10 MG tablet Take 1 tablet (10 mg total) by mouth daily. 90 tablet 3  ? cyclobenzaprine (FLEXERIL) 5 MG tablet Take 1 tablet by mouth three times  daily as needed for muscle spasm 90 tablet 0  ? Fluticasone-Salmeterol (ADVAIR DISKUS) 250-50 MCG/DOSE AEPB Inhale 1 puff into the lungs 2 (two) times daily. 60 each 11  ? gabapentin (NEURONTIN) 800 MG tablet TAKE 1 TABLET BY MOUTH THREE TIMES DAILY 270 tablet 0  ? hydrOXYzine (ATARAX) 10 MG tablet TAKE 1 TABLET BY MOUTH THREE TIMES DAILY AS NEEDED FOR ITCHING 30 tablet 0  ? ibuprofen (ADVIL) 800 MG tablet Take 800 mg by mouth every 8 (eight) hours as needed for moderate pain.    ? insulin glargine (LANTUS) 100 UNIT/ML Solostar Pen Inject 20 Units into the skin at bedtime. 15 mL 11  ? Insulin Pen Needle (PEN NEEDLES 31GX5/16") 31G X 8 MM MISC 1 pen by Does not apply route at bedtime. 100 each 2  ? linaclotide (LINZESS) 290 MCG CAPS capsule Take 1 capsule (290 mcg total) by mouth daily before breakfast. 30 capsule 0  ? metolazone (ZAROXOLYN) 2.5 MG tablet Take 1 tablet (2.5 mg total) by mouth 2 (two) times a week. On Wednesday and Saturday 20 tablet 3  ? naloxone (NARCAN) nasal spray 4 mg/0.1 mL Place 1 spray into the nose as needed.    ? omeprazole (PRILOSEC) 20 MG capsule Take 20 mg by mouth at bedtime.    ? Oxycodone HCl 10 MG TABS Take 10 mg by mouth every 4 (four) hours as needed (pain).    ? potassium chloride SA (KLOR-CON M) 20 MEQ tablet Take 1 tablet (20 mEq total) by mouth daily. 20 tablet 3  ? promethazine (PHENERGAN) 25 MG tablet Take 25 mg by mouth every 8 (eight) hours as needed for nausea or vomiting.    ? spironolactone (ALDACTONE) 25 MG tablet Take 1 tablet (25 mg total) by mouth daily. 30 tablet 3  ? torsemide (DEMADEX) 20 MG tablet Take 3 tablets (60 mg total) by mouth 2 (two) times daily. 180 tablet 1  ? vitamin B-12 (CYANOCOBALAMIN) 1000 MCG tablet Take 1,000 mcg by mouth daily.    ? vitamin C (ASCORBIC ACID) 500 MG tablet Take 500 mg by mouth daily.    ? ?No current facility-administered medications for this visit.  ? ?No Known Allergies ? ?Social History  ? ?Socioeconomic History  ? Marital  status: Married  ?  Spouse name: Not on file  ? Number of children: 3  ? Years of education: Not on file  ? Highest education level: Not on file  ?Occupational History  ? Not on file  ?Tobacco Use  ? Smoking status: Former  ?  Packs/day: 0.25  ?  Types: Cigarettes  ?  Quit date: 04/24/2020  ?  Years since quitting: 1.5  ? Smokeless tobacco: Never  ? Tobacco comments:  ?  4-5 cigarettes a day  ?Vaping Use  ? Vaping Use: Never used  ?Substance and Sexual Activity  ? Alcohol use:  No  ? Drug use: No  ? Sexual activity: Not on file  ?Other Topics Concern  ? Not on file  ?Social History Narrative  ? Not on file  ? ?Social Determinants of Health  ? ?Financial Resource Strain: Not on file  ?Food Insecurity: Not on file  ?Transportation Needs: Not on file  ?Physical Activity: Not on file  ?Stress: Not on file  ?Social Connections: Not on file  ?Intimate Partner Violence: Not on file  ? ?Family History  ?Problem Relation Age of Onset  ? Diabetes Mother   ? Hypertension Father   ? Heart disease Other   ? Diabetes Other   ? Hypertension Other   ? Alcohol abuse Other   ? Hypertension Brother   ? Asthma Brother   ? Hypertension Brother   ? Asthma Brother   ? COPD Maternal Grandmother   ? Lung cancer Maternal Grandmother   ? Asthma Paternal Grandmother   ? Asthma Paternal Aunt   ? ?LMP 02/13/2014  ? ?Wt Readings from Last 3 Encounters:  ?10/15/21 (!) 205.9 kg (454 lb)  ?09/21/21 (!) 210.7 kg (464 lb 9.6 oz)  ?06/12/21 (!) 208.7 kg (460 lb)  ? ?PHYSICAL EXAM: ?General:  NAD. No resp difficulty, arrived in [facility] ?HEENT: Congested-sounding voice ?Neck: Supple. Thick neck. Carotids 2+ bilat; no bruits. No lymphadenopathy or thryomegaly appreciated. ?Cor: PMI nondisplaced. Regular rate & rhythm. No rubs, gallops or murmurs. ?Lungs: Clear ?Abdomen: Obese, soft, nontender, nondistended. No hepatosplenomegaly. No bruits or masses. Good bowel sounds. ?Extremities: No cyanosis, clubbing, rash, 1-2+ BLE pre-tibial edema, chronic venous stasis  changes to BLE. ?Neuro: Alert & oriented x 3, cranial nerves grossly intact. Moves all 4 extremities w/o difficulty. Affect pleasant. ? ?ASSESSMENT & PLAN: ?1. Chronic HFpEF  ?- Echo (4/22): EF 60-65%, RV

## 2021-11-07 ENCOUNTER — Ambulatory Visit: Payer: Medicaid Other | Admitting: Pulmonary Disease

## 2021-11-12 ENCOUNTER — Other Ambulatory Visit: Payer: Self-pay | Admitting: Internal Medicine

## 2021-11-16 ENCOUNTER — Other Ambulatory Visit: Payer: Self-pay

## 2021-11-21 ENCOUNTER — Other Ambulatory Visit: Payer: Self-pay

## 2021-11-21 ENCOUNTER — Telehealth: Payer: Self-pay | Admitting: Nurse Practitioner

## 2021-11-21 DIAGNOSIS — R0602 Shortness of breath: Secondary | ICD-10-CM

## 2021-11-21 DIAGNOSIS — E119 Type 2 diabetes mellitus without complications: Secondary | ICD-10-CM

## 2021-11-21 DIAGNOSIS — K5909 Other constipation: Secondary | ICD-10-CM

## 2021-11-21 MED ORDER — LINACLOTIDE 290 MCG PO CAPS: 290.0000 ug | ORAL_CAPSULE | Freq: Every day | ORAL | 3 refills | Status: DC

## 2021-11-21 MED ORDER — ALBUTEROL SULFATE (2.5 MG/3ML) 0.083% IN NEBU: INHALATION_SOLUTION | RESPIRATORY_TRACT | 1 refills | Status: AC

## 2021-11-21 MED ORDER — INSULIN GLARGINE 100 UNIT/ML SOLOSTAR PEN: 20.0000 [IU] | PEN_INJECTOR | Freq: Every day | SUBCUTANEOUS | 11 refills | Status: DC

## 2021-11-21 NOTE — Telephone Encounter (Signed)
Lantus  Albuterol  Linzess refill request

## 2021-11-21 NOTE — Telephone Encounter (Signed)
Refills were sent to the patients pharmacy.

## 2021-11-22 ENCOUNTER — Ambulatory Visit: Payer: Medicaid Other | Admitting: Pulmonary Disease

## 2021-11-23 ENCOUNTER — Ambulatory Visit (HOSPITAL_COMMUNITY)
Admission: RE | Admit: 2021-11-23 | Discharge: 2021-11-23 | Disposition: A | Discharge status: Home / Self Care | Payer: Medicaid Other | Source: Ambulatory Visit | Attending: Family Medicine | Admitting: Family Medicine

## 2021-11-23 ENCOUNTER — Encounter (HOSPITAL_COMMUNITY): Payer: Self-pay

## 2021-11-23 VITALS — BP 106/86 | HR 100 | Wt >= 6400 oz

## 2021-11-23 DIAGNOSIS — Z794 Long term (current) use of insulin: Secondary | ICD-10-CM | POA: Diagnosis not present

## 2021-11-23 DIAGNOSIS — G4734 Idiopathic sleep related nonobstructive alveolar hypoventilation: Secondary | ICD-10-CM

## 2021-11-23 DIAGNOSIS — I110 Hypertensive heart disease with heart failure: Secondary | ICD-10-CM | POA: Diagnosis not present

## 2021-11-23 DIAGNOSIS — J9611 Chronic respiratory failure with hypoxia: Secondary | ICD-10-CM | POA: Diagnosis not present

## 2021-11-23 DIAGNOSIS — I5033 Acute on chronic diastolic (congestive) heart failure: Secondary | ICD-10-CM | POA: Diagnosis not present

## 2021-11-23 DIAGNOSIS — Z6841 Body Mass Index (BMI) 40.0 and over, adult: Secondary | ICD-10-CM | POA: Insufficient documentation

## 2021-11-23 DIAGNOSIS — M1990 Unspecified osteoarthritis, unspecified site: Secondary | ICD-10-CM | POA: Diagnosis not present

## 2021-11-23 DIAGNOSIS — E785 Hyperlipidemia, unspecified: Secondary | ICD-10-CM | POA: Diagnosis not present

## 2021-11-23 DIAGNOSIS — R002 Palpitations: Secondary | ICD-10-CM | POA: Insufficient documentation

## 2021-11-23 DIAGNOSIS — E119 Type 2 diabetes mellitus without complications: Secondary | ICD-10-CM | POA: Insufficient documentation

## 2021-11-23 DIAGNOSIS — Z79899 Other long term (current) drug therapy: Secondary | ICD-10-CM | POA: Insufficient documentation

## 2021-11-23 DIAGNOSIS — R0600 Dyspnea, unspecified: Secondary | ICD-10-CM

## 2021-11-23 DIAGNOSIS — J45909 Unspecified asthma, uncomplicated: Secondary | ICD-10-CM | POA: Diagnosis not present

## 2021-11-23 LAB — BASIC METABOLIC PANEL
Anion gap: 9 (ref 5–15)
BUN: 26 mg/dL — ABNORMAL HIGH (ref 6–20)
CO2: 33 mmol/L — ABNORMAL HIGH (ref 22–32)
Calcium: 9.7 mg/dL (ref 8.9–10.3)
Chloride: 93 mmol/L — ABNORMAL LOW (ref 98–111)
Creatinine, Ser: 1.28 mg/dL — ABNORMAL HIGH (ref 0.44–1.00)
GFR, Estimated: 51 mL/min — ABNORMAL LOW (ref >60)
Glucose, Bld: 136 mg/dL — ABNORMAL HIGH (ref 70–99)
Potassium: 3.9 mmol/L (ref 3.5–5.1)
Sodium: 135 mmol/L (ref 135–145)

## 2021-11-23 MED ORDER — TORSEMIDE 20 MG PO TABS: 60.0000 mg | ORAL_TABLET | Freq: Two times a day (BID) | ORAL | 2 refills | Status: DC

## 2021-11-23 NOTE — Patient Instructions (Addendum)
Labs done today. We will contact you only if your labs are abnormal.  INCREASE Torsemide to '100mg'$  (5 tablets) by mouth 2 times daily for 3 days THEN DECREASE to '60mg'$  (3 tablets) by mouth 2 times daily.  INCREASE Potassium to 45mq (3 tablets) by mouth daily for 3 days THEN DECREASE to 223m (1 tablet) by mouth daily.   Continue Metolazone 2 times weekly.   No other medication changes were made. Please continue all current medications as prescribed.  Your physician recommends that you schedule a follow-up appointment in: 10 days for a lab only appointment, 2 weeks with our NP/PA Clinic here and in 3-4 months with Dr. BeVaughan BrownerAll appointments here in our office.   If you have any questions or concerns before your next appointment please send usKorea message through myRamosr call our office at [phone]   TO LEAVE A MESSAGE FOR THE NURSE SELECT OPTION 2, PLEASE LEAVE A MESSAGE INCLUDING: YOUR NAME DATE OF BIRTH CALL BACK NUMBER REASON FOR CALL**this is important as we prioritize the call backs  YOU WILL RECEIVE A CALL BACK THE SAME DAY AS LONG AS YOU CALL BEFORE 4:00 PM   Do the following things EVERYDAY: Weigh yourself in the morning before breakfast. Write it down and keep it in a log. Take your medicines as prescribed Eat low salt foods--Limit salt (sodium) to 2000 mg per day.  Stay as active as you can everyday Limit all fluids for the day to less than 2 liters   At [facility] and your health needs are our priority. As part of our continuing mission to provide you with exceptional heart care, we have created designated Provider Care Teams. These Care Teams include your primary Cardiologist (physician) and Advanced Practice Providers (APPs- Physician Assistants and Nurse Practitioners) who all work together to provide you with the care you need, when you need it.   You may see any of the following providers on your designated Care Team at your next  follow up: Dr DaGlori Bickersr DaHaynes KernsNP BrLyda JesterPAUtahaAudry RilesPharmD   Please be sure to bring in all your medications bottles to every appointment.

## 2021-11-23 NOTE — Progress Notes (Signed)
ADVANCED HF CLINIC NOTE   PCP: Dionisio David NP  Pulmonology: Dr. Carlis Abbott Primary Cardiologist: Dr Virgina Jock  HF Cardiologist: Dr. Haroldine Laws  HPI:  Cynthia Becker is a 51 y.o. female referred by [name], NP for chronic diastolic CHF.  Has history of chronic diastolic CHF, HTN, morbid obesity, type II DM, HLD tobacco use, asthma, chronic respiratory failure with hypoxia on supplemental O2.    She's had multiple admissions over the last few years with a/c respiratory failure with hypoxia secondary to a/c diastolic CHF.    Echo 04/22: EF 60-65%, RV not well visualized, study technically difficult d/t body habitus.   Admitted 12/22 with a/c respiratory failure with hypoxia secondary to influenza A pneumonia and a/c CHF. Diuresed with IV lasix. Discharged on torsemide 40 mg daily. Course complicated by encephalopathy, felt to be likely d/t CO2 narcosis vs narcotic use.   Saw her PCP for f/u 06/12/21. Noted 20 lb weight gain after discharge. Torsemide increased to 40 mg bid. She was subsequently referred to [facility] for evaluation.  First seen in AHF clinic 09/21/21, markedly volume overloaded, weight 464 lbs. Spiro 12.5 added, and instructed to take metolazone 2x/week. Zio-14 day placed at follow  up due to palpitations.  Today she returns for HF follow up with her grandson and husband. Overall feeling poorly. She feels she has more fluid on board. She is more short of breath with minimal activity and swelling has started back into her thighs. She is occasionally dizzy. Denies CP, palpitations, or PND/Orthopnea. Appetite ok. No fever or chills. Weight at home 455 pounds. Taking all medications. Uses 2L oxygen at night. She is disabled and lives with husband and grandson. She has been drinking a lot of fluids due to dry mouth. She lost her Zio patch that was placed last visit, but denies further palpitations.  Cardiac Studies: - Echo (4/22): EF 60-65%, RV not well  visualized, study technically difficult d/t body habitus.  - Echo (11/21): EF 60-65%, moderate LVH, grade II DD, RV normal  - Echo (2/21): EF 50-55%, grade II DD, moderate LVH, technically difficult study.  ROS: All systems reviewed and negative except as per HPI.   Past Medical History:  Diagnosis Date   Anemia    iv iron treatment dec 2014/ see oncology for this last in dec 2014   Anxiety    Arthritis    knee, hands   Asthma    seasonal related/ albuterol used when uri   CHF (congestive heart failure) ([facility])    CN (constipation) 09/14/2014   DEGENERATIVE DISC DISEASE, LUMBOSACRAL SPINE W/RADICULOPATHY 11/01/2009   Qualifier: Diagnosis of  By: Jorene Minors, Scott     Depression    Diabetes ([place])    H/O dizziness    Hx   Headache(784.0)    otc med prn   High blood pressure    HYPERLIPIDEMIA 02/13/2010   Qualifier: Diagnosis of  By: Jorene Minors, Scott     INSOMNIA 08/24/2008   Qualifier: Diagnosis of  By: Radene Ou MD, [place]     Morbid obesity ([place]) 05/24/2008   Qualifier: Diagnosis of  By: Radene Ou MD, [place]     SVD (spontaneous vaginal delivery)    x 3   Current Outpatient Medications  Medication Sig Dispense Refill   ACCU-CHEK FASTCLIX LANCETS MISC USE TO CHECK BLOOD SUGAR TWICE DAILY  11   ACCU-CHEK GUIDE test strip 1 each by Other route 3 (three) times daily. Use as instructed 270 each 3  albuterol (PROVENTIL) (2.5 MG/3ML) 0.083% nebulizer solution USE 1 VIAL IN NEBULIZER EVERY 6 HOURS AS NEEDED FOR WHEEZING OR SHORTNESS OF BREATH 150 mL 1   benzonatate (TESSALON) 200 MG capsule Take 1 capsule (200 mg total) by mouth 2 (two) times daily as needed for cough. 20 capsule 0   carvedilol (COREG) 3.125 MG tablet TAKE 1 TABLET BY MOUTH TWICE DAILY WITH A MEAL 60 tablet 0   cetirizine (ZYRTEC ALLERGY) 10 MG tablet Take 1 tablet (10 mg total) by mouth daily. 90 tablet 3   cyclobenzaprine (FLEXERIL) 5 MG tablet Take 1 tablet by mouth three times daily as needed for muscle spasm 90  tablet 0   Fluticasone-Salmeterol (ADVAIR DISKUS) 250-50 MCG/DOSE AEPB Inhale 1 puff into the lungs 2 (two) times daily. 60 each 11   gabapentin (NEURONTIN) 800 MG tablet TAKE 1 TABLET BY MOUTH THREE TIMES DAILY 270 tablet 0   hydrOXYzine (ATARAX) 10 MG tablet TAKE 1 TABLET BY MOUTH THREE TIMES DAILY AS NEEDED FOR ITCHING 30 tablet 0   ibuprofen (ADVIL) 800 MG tablet Take 800 mg by mouth every 8 (eight) hours as needed for moderate pain.     insulin glargine (LANTUS) 100 UNIT/ML Solostar Pen Inject 20 Units into the skin at bedtime. 15 mL 11   Insulin Pen Needle (PEN NEEDLES 31GX5/16") 31G X 8 MM MISC 1 pen by Does not apply route at bedtime. 100 each 2   linaclotide (LINZESS) 290 MCG CAPS capsule Take 1 capsule (290 mcg total) by mouth daily before breakfast. 30 capsule 3   metolazone (ZAROXOLYN) 2.5 MG tablet Take 1 tablet (2.5 mg total) by mouth 2 (two) times a week. On Wednesday and Saturday 20 tablet 3   naloxone (NARCAN) nasal spray 4 mg/0.1 mL Place 1 spray into the nose as needed.     omeprazole (PRILOSEC) 20 MG capsule Take 20 mg by mouth at bedtime.     Oxycodone HCl 10 MG TABS Take 10 mg by mouth every 4 (four) hours as needed (pain).     potassium chloride SA (KLOR-CON M) 20 MEQ tablet Take 1 tablet (20 mEq total) by mouth daily. 20 tablet 3   promethazine (PHENERGAN) 25 MG tablet Take 25 mg by mouth every 8 (eight) hours as needed for nausea or vomiting.     spironolactone (ALDACTONE) 25 MG tablet Take 1 tablet (25 mg total) by mouth daily. 30 tablet 3   torsemide (DEMADEX) 20 MG tablet Patient takes 2 tablets by mouth twice a day.     Turmeric (QC TUMERIC COMPLEX PO) Take 500 mg by mouth daily.     vitamin B-12 (CYANOCOBALAMIN) 1000 MCG tablet Take 1,000 mcg by mouth daily.     No current facility-administered medications for this encounter.   No Known Allergies  Social History   Socioeconomic History   Marital status: Married    Spouse name: Not on file   Number of  children: 3   Years of education: Not on file   Highest education level: Not on file  Occupational History   Not on file  Tobacco Use   Smoking status: Former    Packs/day: 0.25    Types: Cigarettes    Quit date: 04/24/2020    Years since quitting: 1.5   Smokeless tobacco: Never   Tobacco comments:    4-5 cigarettes a day  Vaping Use   Vaping Use: Never used  Substance and Sexual Activity   Alcohol use: No   Drug use: No  Sexual activity: Not on file  Other Topics Concern   Not on file  Social History Narrative   Not on file   Social Determinants of Health   Financial Resource Strain: Not on file  Food Insecurity: Not on file  Transportation Needs: Not on file  Physical Activity: Not on file  Stress: Not on file  Social Connections: Not on file  Intimate Partner Violence: Not on file   Family History  Problem Relation Age of Onset   Diabetes Mother    Hypertension Father    Heart disease Other    Diabetes Other    Hypertension Other    Alcohol abuse Other    Hypertension Brother    Asthma Brother    Hypertension Brother    Asthma Brother    COPD Maternal Grandmother    Lung cancer Maternal Grandmother    Asthma Paternal Grandmother    Asthma Paternal Aunt    BP 106/86   Pulse 100   Wt (!) 208 kg (458 lb 9.6 oz)   LMP 02/13/2014   SpO2 95%   BMI 76.32 kg/m   Wt Readings from Last 3 Encounters:  11/23/21 (!) 208 kg (458 lb 9.6 oz)  10/15/21 (!) 205.9 kg (454 lb)  09/21/21 (!) 210.7 kg (464 lb 9.6 oz)   PHYSICAL EXAM: General:  NAD. No resp difficulty, arrived in [facility] HEENT: Normal Neck: Supple. Thick neck. Carotids 2+ bilat; no bruits. No lymphadenopathy or thryomegaly appreciated. Cor: PMI nondisplaced. Regular rate & rhythm. No rubs, gallops or murmurs. Lungs: Clear, diminished in bases. Abdomen: Obese, nontender, nondistended. No hepatosplenomegaly. No bruits or masses. Good bowel sounds. Extremities: No cyanosis, clubbing, rash, 2+ BLE edema to  knees; chronic venous changes to BLE Neuro: Alert & oriented x 3, cranial nerves grossly intact. Moves all 4 extremities w/o difficulty. Affect pleasant.   ASSESSMENT & PLAN: 1. Acute on Chronic HFpEF  - Echo (4/22): EF 60-65%, RV poorly visualized. - Worse NYHA III-IIIb, functional status limited by body habitus and knee OA. Volume up, weight up 4 lbs since last visit. - Increase torsemide to 100 mg bid x 3 days, then back to 60 mg bid + metolazone 2.5 mg twice a week (Wed and Sat) + 40 meq K on Wed/Sat. She will take extra 40 KCL for the 3 days she is increasing her torsemide. - Continue spironolactone 25 mg daily.  - Continue carvedilol 3.125 mg bid. - Add ARB next if BP/labs ok. - No SGLT2i due hygeine.  - Discussed limiting fluid intake to < 2 liter per day and low salt food choices.  - BMET today, repeat in 1 week. - ? Cardiomems candidacy, body habitus may be prohibitive  2. Palpitations - Resolved since last visit. Will hold off on replacing patch unless symptoms return. - Suspect fluid related (+/- frequent albuterol use).  3. Morbid Obesity - Body mass index is 76.32 kg/m. - Referred to [facility]. - Consider referral to PharmD for [place]. Bariatric surgery would be reasonable option.   4. DMII - Followed by PCP. - On insulin. - ? Ozempic for weight loss benefit.   5. OA - Suspect she is not a candidate for knee replacement. - Follows in pain clinic for injections and narcotics.  6. Nocturnal hypoxia - Continue 2L oxygen at night. - No OSA on sleep study (4/19), AHI 4.4  7. Dyspnea w/ cough - Carries a diagnosis of allergic asthma. - She is on Advair, cetirizine, and albuterol  nebs PRN. - Suspect asthma contributing some to clinical picture. - PFTs (12/21): mild restriction, no significant BD response, decreased lung volumes - She has follow up with [facility] soon.  I asked Makenly to call clinic if she is not feeling better and weight not  back down by next week. Next step will be Furoscix x 3 days (hold torsemide on days using Furoscix). She has watched the Furoscix video and paperwork has been started.  Follow up with APP in 2 weeks.  Maricela Bo Cyler Kappes FNP-BC 3:35 PM

## 2021-11-26 ENCOUNTER — Other Ambulatory Visit (HOSPITAL_COMMUNITY): Payer: Self-pay | Admitting: *Deleted

## 2021-11-26 ENCOUNTER — Telehealth (HOSPITAL_COMMUNITY): Payer: Self-pay

## 2021-11-26 NOTE — Telephone Encounter (Signed)
Recent office note faxed per furoscix request on 11/26/2021 @ 09.50

## 2021-11-30 ENCOUNTER — Other Ambulatory Visit: Payer: Self-pay | Admitting: Nurse Practitioner

## 2021-11-30 DIAGNOSIS — M79604 Pain in right leg: Secondary | ICD-10-CM

## 2021-11-30 DIAGNOSIS — M5126 Other intervertebral disc displacement, lumbar region: Secondary | ICD-10-CM

## 2021-12-03 ENCOUNTER — Ambulatory Visit (HOSPITAL_COMMUNITY)
Admission: RE | Admit: 2021-12-03 | Discharge: 2021-12-03 | Disposition: A | Discharge status: Home / Self Care | Payer: Medicaid Other | Source: Ambulatory Visit | Attending: Internal Medicine | Admitting: Internal Medicine

## 2021-12-03 DIAGNOSIS — I5033 Acute on chronic diastolic (congestive) heart failure: Secondary | ICD-10-CM | POA: Insufficient documentation

## 2021-12-03 LAB — BASIC METABOLIC PANEL WITH GFR
Anion gap: 10 (ref 5–15)
BUN: 31 mg/dL — ABNORMAL HIGH (ref 6–20)
CO2: 33 mmol/L — ABNORMAL HIGH (ref 22–32)
Calcium: 9.5 mg/dL (ref 8.9–10.3)
Chloride: 94 mmol/L — ABNORMAL LOW (ref 98–111)
Creatinine, Ser: 1.41 mg/dL — ABNORMAL HIGH (ref 0.44–1.00)
GFR, Estimated: 45 mL/min — ABNORMAL LOW
Glucose, Bld: 196 mg/dL — ABNORMAL HIGH (ref 70–99)
Potassium: 3.9 mmol/L (ref 3.5–5.1)
Sodium: 137 mmol/L (ref 135–145)

## 2021-12-07 ENCOUNTER — Encounter (HOSPITAL_COMMUNITY): Payer: Medicaid Other

## 2021-12-07 NOTE — Progress Notes (Incomplete)
ADVANCED HF CLINIC NOTE   PCP: Dionisio David NP  Pulmonology: Dr. Carlis Abbott Primary Cardiologist: Dr Virgina Jock  HF Cardiologist: Dr. Haroldine Laws  HPI:  Cynthia Becker is a 51 y.o. female referred by [name], NP for chronic diastolic CHF.  Has history of chronic diastolic CHF, HTN, morbid obesity, type II DM, HLD tobacco use, asthma, chronic respiratory failure with hypoxia on supplemental O2.    She's had multiple admissions over the last few years with a/c respiratory failure with hypoxia secondary to a/c diastolic CHF.    Echo 04/22: EF 60-65%, RV not well visualized, study technically difficult d/t body habitus.   Admitted 12/22 with a/c respiratory failure with hypoxia secondary to influenza A pneumonia and a/c CHF. Diuresed with IV lasix. Discharged on torsemide 40 mg daily. Course complicated by encephalopathy, felt to be likely d/t CO2 narcosis vs narcotic use.   Saw her PCP for f/u 06/12/21. Noted 20 lb weight gain after discharge. Torsemide increased to 40 mg bid. She was subsequently referred to [facility] for evaluation.  First seen in AHF clinic 09/21/21, markedly volume overloaded, weight 464 lbs. Spiro 12.5 added, and instructed to take metolazone 2x/week. Zio-14 day placed at follow  up due to palpitations.  Today she returns for HF follow up with her grandson and husband. Overall feeling poorly. She feels she has more fluid on board. She is more short of breath with minimal activity and swelling has started back into her thighs. She is occasionally dizzy. Denies CP, palpitations, or PND/Orthopnea. Appetite ok. No fever or chills. Weight at home 455 pounds. Taking all medications. Uses 2L oxygen at night. She is disabled and lives with husband and grandson. She has been drinking a lot of fluids due to dry mouth. She lost her Zio patch that was placed last visit, but denies further palpitations.  Cardiac Studies: - Echo (4/22): EF 60-65%, RV not well  visualized, study technically difficult d/t body habitus.  - Echo (11/21): EF 60-65%, moderate LVH, grade II DD, RV normal  - Echo (2/21): EF 50-55%, grade II DD, moderate LVH, technically difficult study.  ROS: All systems reviewed and negative except as per HPI.   Past Medical History:  Diagnosis Date   Anemia    iv iron treatment dec 2014/ see oncology for this last in dec 2014   Anxiety    Arthritis    knee, hands   Asthma    seasonal related/ albuterol used when uri   CHF (congestive heart failure) (HCC)    CN (constipation) 09/14/2014   DEGENERATIVE DISC DISEASE, LUMBOSACRAL SPINE W/RADICULOPATHY 11/01/2009   Qualifier: Diagnosis of  By: Jorene Minors, [place]     Depression    Diabetes ([place])    H/O dizziness    Hx   Headache(784.0)    otc med prn   High blood pressure    HYPERLIPIDEMIA 02/13/2010   Qualifier: Diagnosis of  By: Jorene Minors, [place]     INSOMNIA 08/24/2008   Qualifier: Diagnosis of  By: Radene Ou MD, [place]     Morbid obesity ([place]) 05/24/2008   Qualifier: Diagnosis of  By: Radene Ou MD, [place]     SVD (spontaneous vaginal delivery)    x 3   Current Outpatient Medications  Medication Sig Dispense Refill   ACCU-CHEK FASTCLIX LANCETS MISC USE TO CHECK BLOOD SUGAR TWICE DAILY  11   ACCU-CHEK GUIDE test strip 1 each by Other route 3 (three) times daily. Use as instructed 270 each 3  albuterol (PROVENTIL) (2.5 MG/3ML) 0.083% nebulizer solution USE 1 VIAL IN NEBULIZER EVERY 6 HOURS AS NEEDED FOR WHEEZING OR SHORTNESS OF BREATH 150 mL 1   benzonatate (TESSALON) 200 MG capsule Take 1 capsule (200 mg total) by mouth 2 (two) times daily as needed for cough. 20 capsule 0   carvedilol (COREG) 3.125 MG tablet TAKE 1 TABLET BY MOUTH TWICE DAILY WITH A MEAL 60 tablet 0   cetirizine (ZYRTEC ALLERGY) 10 MG tablet Take 1 tablet (10 mg total) by mouth daily. 90 tablet 3   cyclobenzaprine (FLEXERIL) 5 MG tablet Take 1 tablet by mouth three times daily as needed for muscle spasm 90  tablet 0   Fluticasone-Salmeterol (ADVAIR DISKUS) 250-50 MCG/DOSE AEPB Inhale 1 puff into the lungs 2 (two) times daily. 60 each 11   gabapentin (NEURONTIN) 800 MG tablet TAKE 1 TABLET BY MOUTH THREE TIMES DAILY 270 tablet 0   hydrOXYzine (ATARAX) 10 MG tablet TAKE 1 TABLET BY MOUTH THREE TIMES DAILY AS NEEDED FOR ITCHING 30 tablet 0   ibuprofen (ADVIL) 800 MG tablet Take 800 mg by mouth every 8 (eight) hours as needed for moderate pain.     insulin glargine (LANTUS) 100 UNIT/ML Solostar Pen Inject 20 Units into the skin at bedtime. 15 mL 11   Insulin Pen Needle (PEN NEEDLES 31GX5/16") 31G X 8 MM MISC 1 pen by Does not apply route at bedtime. 100 each 2   linaclotide (LINZESS) 290 MCG CAPS capsule Take 1 capsule (290 mcg total) by mouth daily before breakfast. 30 capsule 3   metolazone (ZAROXOLYN) 2.5 MG tablet Take 1 tablet (2.5 mg total) by mouth 2 (two) times a week. On Wednesday and Saturday 20 tablet 3   naloxone (NARCAN) nasal spray 4 mg/0.1 mL Place 1 spray into the nose as needed.     omeprazole (PRILOSEC) 20 MG capsule Take 20 mg by mouth at bedtime.     Oxycodone HCl 10 MG TABS Take 10 mg by mouth every 4 (four) hours as needed (pain).     potassium chloride SA (KLOR-CON M) 20 MEQ tablet Take 1 tablet (20 mEq total) by mouth daily. 20 tablet 3   promethazine (PHENERGAN) 25 MG tablet Take 25 mg by mouth every 8 (eight) hours as needed for nausea or vomiting.     spironolactone (ALDACTONE) 25 MG tablet Take 1 tablet (25 mg total) by mouth daily. 30 tablet 3   torsemide (DEMADEX) 20 MG tablet Take 3 tablets (60 mg total) by mouth 2 (two) times daily. 565 tablet 2   Turmeric (QC TUMERIC COMPLEX PO) Take 500 mg by mouth daily.     vitamin B-12 (CYANOCOBALAMIN) 1000 MCG tablet Take 1,000 mcg by mouth daily.     No current facility-administered medications for this visit.   No Known Allergies  Social History   Socioeconomic History   Marital status: Married    Spouse name: Not on file    Number of children: 3   Years of education: Not on file   Highest education level: Not on file  Occupational History   Not on file  Tobacco Use   Smoking status: Former    Packs/day: 0.25    Types: Cigarettes    Quit date: 04/24/2020    Years since quitting: 1.6   Smokeless tobacco: Never   Tobacco comments:    4-5 cigarettes a day  Vaping Use   Vaping Use: Never used  Substance and Sexual Activity   Alcohol use: No  Drug use: No   Sexual activity: Not on file  Other Topics Concern   Not on file  Social History Narrative   Not on file   Social Determinants of Health   Financial Resource Strain: Not on file  Food Insecurity: Not on file  Transportation Needs: Not on file  Physical Activity: Not on file  Stress: Not on file  Social Connections: Not on file  Intimate Partner Violence: Not on file   Family History  Problem Relation Age of Onset   Diabetes Mother    Hypertension Father    Heart disease Other    Diabetes Other    Hypertension Other    Alcohol abuse Other    Hypertension Brother    Asthma Brother    Hypertension Brother    Asthma Brother    COPD Maternal Grandmother    Lung cancer Maternal Grandmother    Asthma Paternal Grandmother    Asthma Paternal Aunt    LMP 02/13/2014   Wt Readings from Last 3 Encounters:  11/23/21 (!) 208 kg (458 lb 9.6 oz)  10/15/21 (!) 205.9 kg (454 lb)  09/21/21 (!) 210.7 kg (464 lb 9.6 oz)   PHYSICAL EXAM: General:  NAD. No resp difficulty, arrived in [facility] HEENT: Normal Neck: Supple. Thick neck. Carotids 2+ bilat; no bruits. No lymphadenopathy or thryomegaly appreciated. Cor: PMI nondisplaced. Regular rate & rhythm. No rubs, gallops or murmurs. Lungs: Clear, diminished in bases. Abdomen: Obese, nontender, nondistended. No hepatosplenomegaly. No bruits or masses. Good bowel sounds. Extremities: No cyanosis, clubbing, rash, 2+ BLE edema to knees; chronic venous changes to BLE Neuro: Alert & oriented x 3, cranial  nerves grossly intact. Moves all 4 extremities w/o difficulty. Affect pleasant.   ASSESSMENT & PLAN: 1. Acute on Chronic HFpEF  - Echo (4/22): EF 60-65%, RV poorly visualized. - Worse NYHA III-IIIb, functional status limited by body habitus and knee OA. Volume up, weight up 4 lbs since last visit. - Increase torsemide to 100 mg bid x 3 days, then back to 60 mg bid + metolazone 2.5 mg twice a week (Wed and Sat) + 40 meq K on Wed/Sat. She will take extra 40 KCL for the 3 days she is increasing her torsemide. - Continue spironolactone 25 mg daily.  - Continue carvedilol 3.125 mg bid. - Add ARB next if BP/labs ok. - No SGLT2i due hygeine.  - Discussed limiting fluid intake to < 2 liter per day and low salt food choices.  - BMET today, repeat in 1 week. - ? Cardiomems candidacy, body habitus may be prohibitive  2. Palpitations - Resolved since last visit. Will hold off on replacing patch unless symptoms return. - Suspect fluid related (+/- frequent albuterol use).  3. Morbid Obesity - There is no height or weight on file to calculate BMI. - Referred to [facility]. - Consider referral to PharmD for Ozempic. Bariatric surgery would be reasonable option.   4. DMII - Followed by PCP. - On insulin. - ? Ozempic for weight loss benefit.   5. OA - Suspect she is not a candidate for knee replacement. - Follows in pain clinic for injections and narcotics.  6. Nocturnal hypoxia - Continue 2L oxygen at night. - No OSA on sleep study (4/19), AHI 4.4  7. Dyspnea w/ cough - Carries a diagnosis of allergic asthma. - She is on Advair, cetirizine, and albuterol nebs PRN. - Suspect asthma contributing some to clinical picture. - PFTs (12/21): mild restriction, no significant  BD response, decreased lung volumes - She has follow up with [facility] soon.  I asked [place] to call clinic if she is not feeling better and weight not back down by next week. Next step will be Furoscix  x 3 days (hold torsemide on days using Furoscix). She has watched the Furoscix video and paperwork has been started.  Follow up with APP in 2 weeks.  Maricela Bo Yzabelle Calles FNP-BC 8:15 AM

## 2021-12-10 ENCOUNTER — Other Ambulatory Visit: Payer: Self-pay | Admitting: Nurse Practitioner

## 2021-12-10 DIAGNOSIS — L299 Pruritus, unspecified: Secondary | ICD-10-CM

## 2021-12-12 ENCOUNTER — Ambulatory Visit: Payer: Medicaid Other | Admitting: Pulmonary Disease

## 2022-01-04 ENCOUNTER — Telehealth (HOSPITAL_COMMUNITY): Payer: Self-pay | Admitting: *Deleted

## 2022-01-04 NOTE — Telephone Encounter (Signed)
Per Darden Palmer "if she has applied the Furoscix today, she needs to not take her torsemide or metolazone for today only. She also needs to take EXTRA 40 of KCL today. Would not advise anymore doses until follow up. Tomorrow, she can resume her regular oral diuretic regimen. She needs a BMET on Monday or Tues next week please"  Pt aware and will call Monday with update and to schedule lab appointment.

## 2022-01-04 NOTE — Telephone Encounter (Signed)
Pt called stating her last office visit was 6/2 but she just received Furoscix Wednesday. Pt said she received 4 and has applied the first one today. Pt said she not sure if she should take her other meds or how often she is supposed to use furoscix.    Routed to [employer] for advice

## 2022-01-11 ENCOUNTER — Ambulatory Visit (HOSPITAL_COMMUNITY)
Admission: RE | Admit: 2022-01-11 | Discharge: 2022-01-11 | Disposition: A | Discharge status: Home / Self Care | Payer: Medicaid Other | Source: Ambulatory Visit | Attending: Family Medicine | Admitting: Family Medicine

## 2022-01-11 ENCOUNTER — Encounter (HOSPITAL_COMMUNITY): Payer: Self-pay

## 2022-01-11 VITALS — BP 120/80 | HR 96 | Wt >= 6400 oz

## 2022-01-11 DIAGNOSIS — M79604 Pain in right leg: Secondary | ICD-10-CM | POA: Insufficient documentation

## 2022-01-11 DIAGNOSIS — I110 Hypertensive heart disease with heart failure: Secondary | ICD-10-CM | POA: Diagnosis not present

## 2022-01-11 DIAGNOSIS — Z79899 Other long term (current) drug therapy: Secondary | ICD-10-CM | POA: Diagnosis not present

## 2022-01-11 DIAGNOSIS — M25562 Pain in left knee: Secondary | ICD-10-CM | POA: Diagnosis not present

## 2022-01-11 DIAGNOSIS — G4734 Idiopathic sleep related nonobstructive alveolar hypoventilation: Secondary | ICD-10-CM

## 2022-01-11 DIAGNOSIS — M25561 Pain in right knee: Secondary | ICD-10-CM | POA: Diagnosis not present

## 2022-01-11 DIAGNOSIS — M1990 Unspecified osteoarthritis, unspecified site: Secondary | ICD-10-CM

## 2022-01-11 DIAGNOSIS — J9611 Chronic respiratory failure with hypoxia: Secondary | ICD-10-CM | POA: Insufficient documentation

## 2022-01-11 DIAGNOSIS — J45909 Unspecified asthma, uncomplicated: Secondary | ICD-10-CM | POA: Diagnosis not present

## 2022-01-11 DIAGNOSIS — Z87891 Personal history of nicotine dependence: Secondary | ICD-10-CM | POA: Insufficient documentation

## 2022-01-11 DIAGNOSIS — M79605 Pain in left leg: Secondary | ICD-10-CM | POA: Insufficient documentation

## 2022-01-11 DIAGNOSIS — Z6841 Body Mass Index (BMI) 40.0 and over, adult: Secondary | ICD-10-CM | POA: Insufficient documentation

## 2022-01-11 DIAGNOSIS — Z794 Long term (current) use of insulin: Secondary | ICD-10-CM | POA: Diagnosis not present

## 2022-01-11 DIAGNOSIS — Z9981 Dependence on supplemental oxygen: Secondary | ICD-10-CM | POA: Diagnosis not present

## 2022-01-11 DIAGNOSIS — E119 Type 2 diabetes mellitus without complications: Secondary | ICD-10-CM

## 2022-01-11 DIAGNOSIS — R002 Palpitations: Secondary | ICD-10-CM

## 2022-01-11 DIAGNOSIS — E785 Hyperlipidemia, unspecified: Secondary | ICD-10-CM | POA: Insufficient documentation

## 2022-01-11 DIAGNOSIS — I5033 Acute on chronic diastolic (congestive) heart failure: Secondary | ICD-10-CM

## 2022-01-11 LAB — BASIC METABOLIC PANEL
Anion gap: 10 (ref 5–15)
BUN: 18 mg/dL (ref 6–20)
CO2: 33 mmol/L — ABNORMAL HIGH (ref 22–32)
Calcium: 9.2 mg/dL (ref 8.9–10.3)
Chloride: 94 mmol/L — ABNORMAL LOW (ref 98–111)
Creatinine, Ser: 1.06 mg/dL — ABNORMAL HIGH (ref 0.44–1.00)
GFR, Estimated: >60 mL/min (ref >60)
Glucose, Bld: 138 mg/dL — ABNORMAL HIGH (ref 70–99)
Potassium: 3.8 mmol/L (ref 3.5–5.1)
Sodium: 137 mmol/L (ref 135–145)

## 2022-01-11 LAB — MAGNESIUM: Magnesium: 1.8 mg/dL (ref 1.7–2.4)

## 2022-01-11 LAB — BRAIN NATRIURETIC PEPTIDE: B Natriuretic Peptide: 8.1 pg/mL (ref 0.0–100.0)

## 2022-01-11 NOTE — Progress Notes (Addendum)
ADVANCED HF CLINIC NOTE   PCP: Dionisio David NP  Pulmonology: Dr. Carlis Abbott Primary Cardiologist: Dr Virgina Jock  HF Cardiologist: Dr. Haroldine Laws  HPI:  Cynthia Becker is a 51 y.o. female referred by [name], NP for chronic diastolic CHF.  Has history of chronic diastolic CHF, HTN, morbid obesity, type II DM, HLD tobacco use, asthma, chronic respiratory failure with hypoxia on supplemental O2.    She's had multiple admissions over the last few years with a/c respiratory failure with hypoxia secondary to a/c diastolic CHF.    Echo 04/22: EF 60-65%, RV not well visualized, study technically difficult d/t body habitus.   Admitted 12/22 with a/c respiratory failure with hypoxia secondary to influenza A pneumonia and a/c CHF. Diuresed with IV lasix. Discharged on torsemide 40 mg daily. Course complicated by encephalopathy, felt to be likely d/t CO2 narcosis vs narcotic use.   Saw her PCP for f/u 06/12/21. Noted 20 lb weight gain after discharge. Torsemide increased to 40 mg bid. She was subsequently referred to [facility] for evaluation.  First seen in AHF clinic 09/21/21, markedly volume overloaded, weight 464 lbs. Spiro 12.5 added, and instructed to take metolazone 2x/week. Zio-14 day placed at follow up due to palpitations.  Follow up 6/23, she was volume overloaded with NYHA IIIb symptoms. Torsemide increased to 100 bid and metolazone added twice a week. Discussed using Furoscix in the future and started approval process.  Called clinic 01/04/22 after she had use a Furoscix cartridge.  Today she returns for HF follow up with her grandson and husband. Overall feeling poorly. She has been drinking a lot of fluid and eating salty foods. She is more SOB, knees and legs hurt. Feels she has fluid on board. Denies palpitations, CP, dizziness, abnormal bleeding or PND/Orthopnea. Appetite ok. No fever or chills. Weight at home 465 pounds. Taking all medications. She uses 2L oxygen  at night. She is disabled and lives with husband and grandson.   Cardiac Studies: - Echo (4/22): EF 60-65%, RV not well visualized, study technically difficult d/t body habitus.  - Echo (11/21): EF 60-65%, moderate LVH, grade II DD, RV normal  - Echo (2/21): EF 50-55%, grade II DD, moderate LVH, technically difficult study.  ROS: All systems reviewed and negative except as per HPI.   Past Medical History:  Diagnosis Date   Anemia    iv iron treatment dec 2014/ see oncology for this last in dec 2014   Anxiety    Arthritis    knee, hands   Asthma    seasonal related/ albuterol used when uri   CHF (congestive heart failure) (HCC)    CN (constipation) 09/14/2014   DEGENERATIVE DISC DISEASE, LUMBOSACRAL SPINE W/RADICULOPATHY 11/01/2009   Qualifier: Diagnosis of  By: Jorene Minors, [place]     Depression    Diabetes ([place])    H/O dizziness    Hx   Headache(784.0)    otc med prn   High blood pressure    HYPERLIPIDEMIA 02/13/2010   Qualifier: Diagnosis of  By: Jorene Minors, [place]     INSOMNIA 08/24/2008   Qualifier: Diagnosis of  By: Radene Ou MD, [place]     Morbid obesity ([place]) 05/24/2008   Qualifier: Diagnosis of  By: Radene Ou MD, [place]     SVD (spontaneous vaginal delivery)    x 3   Current Outpatient Medications  Medication Sig Dispense Refill   ACCU-CHEK FASTCLIX LANCETS MISC USE TO CHECK BLOOD SUGAR TWICE DAILY  11   albuterol (  PROVENTIL) (2.5 MG/3ML) 0.083% nebulizer solution USE 1 VIAL IN NEBULIZER EVERY 6 HOURS AS NEEDED FOR WHEEZING OR SHORTNESS OF BREATH 150 mL 1   benzonatate (TESSALON) 200 MG capsule Take 1 capsule (200 mg total) by mouth 2 (two) times daily as needed for cough. 20 capsule 0   carvedilol (COREG) 3.125 MG tablet TAKE 1 TABLET BY MOUTH TWICE DAILY WITH A MEAL 60 tablet 0   cetirizine (ZYRTEC ALLERGY) 10 MG tablet Take 1 tablet (10 mg total) by mouth daily. 90 tablet 3   cyclobenzaprine (FLEXERIL) 5 MG tablet Take 1 tablet by mouth three times daily as needed  for muscle spasm 90 tablet 0   Fluticasone-Salmeterol (ADVAIR DISKUS) 250-50 MCG/DOSE AEPB Inhale 1 puff into the lungs 2 (two) times daily. 60 each 11   gabapentin (NEURONTIN) 800 MG tablet TAKE 1 TABLET BY MOUTH THREE TIMES DAILY 270 tablet 0   hydrOXYzine (ATARAX) 10 MG tablet TAKE 1 TABLET BY MOUTH THREE TIMES DAILY AS NEEDED FOR ITCHING 30 tablet 0   ibuprofen (ADVIL) 800 MG tablet Take 800 mg by mouth every 8 (eight) hours as needed for moderate pain.     insulin glargine (LANTUS) 100 UNIT/ML Solostar Pen Inject 20 Units into the skin at bedtime. 15 mL 11   Insulin Pen Needle (PEN NEEDLES 31GX5/16") 31G X 8 MM MISC 1 pen by Does not apply route at bedtime. 100 each 2   linaclotide (LINZESS) 290 MCG CAPS capsule Take 1 capsule (290 mcg total) by mouth daily before breakfast. 30 capsule 3   metolazone (ZAROXOLYN) 2.5 MG tablet Take 1 tablet (2.5 mg total) by mouth 2 (two) times a week. On Wednesday and Saturday 20 tablet 3   naloxone (NARCAN) nasal spray 4 mg/0.1 mL Place 1 spray into the nose as needed.     omeprazole (PRILOSEC) 20 MG capsule Take 20 mg by mouth at bedtime.     Oxycodone HCl 10 MG TABS Take 10 mg by mouth every 4 (four) hours as needed (pain).     potassium chloride SA (KLOR-CON M) 20 MEQ tablet Take 1 tablet (20 mEq total) by mouth daily. 20 tablet 3   promethazine (PHENERGAN) 25 MG tablet Take 25 mg by mouth every 8 (eight) hours as needed for nausea or vomiting.     spironolactone (ALDACTONE) 25 MG tablet Take 1 tablet (25 mg total) by mouth daily. 30 tablet 3   torsemide (DEMADEX) 20 MG tablet Take 3 tablets (60 mg total) by mouth 2 (two) times daily. 565 tablet 2   Turmeric (QC TUMERIC COMPLEX PO) Take 500 mg by mouth daily.     vitamin B-12 (CYANOCOBALAMIN) 1000 MCG tablet Take 1,000 mcg by mouth daily.     No current facility-administered medications for this encounter.   No Known Allergies  Social History   Socioeconomic History   Marital status: Married     Spouse name: Not on file   Number of children: 3   Years of education: Not on file   Highest education level: Not on file  Occupational History   Not on file  Tobacco Use   Smoking status: Former    Packs/day: 0.25    Types: Cigarettes    Quit date: 04/24/2020    Years since quitting: 1.7   Smokeless tobacco: Never   Tobacco comments:    4-5 cigarettes a day  Vaping Use   Vaping Use: Never used  Substance and Sexual Activity   Alcohol use: No  Drug use: No   Sexual activity: Not on file  Other Topics Concern   Not on file  Social History Narrative   Not on file   Social Determinants of Health   Financial Resource Strain: Not on file  Food Insecurity: Not on file  Transportation Needs: Not on file  Physical Activity: Not on file  Stress: Not on file  Social Connections: Not on file  Intimate Partner Violence: Not on file   Family History  Problem Relation Age of Onset   Diabetes Mother    Hypertension Father    Heart disease Other    Diabetes Other    Hypertension Other    Alcohol abuse Other    Hypertension Brother    Asthma Brother    Hypertension Brother    Asthma Brother    COPD Maternal Grandmother    Lung cancer Maternal Grandmother    Asthma Paternal Grandmother    Asthma Paternal Aunt    BP 120/80   Pulse 96   Wt (!) 210.9 kg (465 lb)   LMP 02/13/2014   SpO2 90%   BMI 77.38 kg/m   Wt Readings from Last 3 Encounters:  01/11/22 (!) 210.9 kg (465 lb)  11/23/21 (!) 208 kg (458 lb 9.6 oz)  10/15/21 (!) 205.9 kg (454 lb)   PHYSICAL EXAM: General:  NAD. No resp difficulty, arrived in [facility] HEENT: Normal Neck: Supple. Thick neck. Carotids 2+ bilat; no bruits. No lymphadenopathy or thryomegaly appreciated. Cor: PMI nondisplaced. Regular rate & rhythm. No rubs, gallops or murmurs. Lungs: Clear Abdomen: super morbid obese, + distended, nontender. No hepatosplenomegaly. No bruits or masses. Good bowel sounds. Extremities: No cyanosis, clubbing, rash,  2+ BLE edema to knees Neuro: Alert & oriented x 3, cranial nerves grossly intact. Moves all 4 extremities w/o difficulty. Affect pleasant.  ASSESSMENT & PLAN: 1. Acute on Chronic HFpEF  - Echo (4/22): EF 60-65%, RV poorly visualized. - NYHA IIIb-early IV, functional status limited by body habitus and knee OA. Markedly volume overloaded, weight up 7 lbs since last visit. - Stop torsemide. - Take Furoscix 80 mg daily + metolazone 2.5 mg daily + 40 KCL bid  x 3 days. - After 3 days, resume torsemide 60 mg bid w/ 20 KCL + metolazone twice weekly (Wed and Fridays) - Continue spironolactone 25 mg daily.  - Continue carvedilol 3.125 mg bid. - Add ARB next if BP/labs ok. - No SGLT2i due hygeine.  - Discussed limiting fluid intake to < 2 liter per day and low salt food choices.  - ? Cardiomems candidacy, body habitus may be prohibitive - Labs today, repeat at follow up next week. May need RHC soon.  2. Palpitations - Resolved since last visit. Will hold off on replacing patch unless symptoms return. - Suspect fluid related (+/- frequent albuterol use).  3. Morbid Obesity - Body mass index is 77.38 kg/m. - Referred to [facility]. - Consider referral to PharmD for [place]. Bariatric surgery would be reasonable option.   4. DMII - Followed by PCP. - On insulin. - ? Ozempic for weight loss benefit.   5. OA - Suspect she is not a candidate for knee replacement. - Follows in pain clinic for injections and narcotics.  6. Nocturnal hypoxia - Continue 2L oxygen at night. - No OSA on sleep study (4/19), AHI 4.4  Follow up next week with APP. If she fails outpatient attempts at diuresis, she will need to  be admitted +/- RHC.  [place] FNP-BC 3:44 PM

## 2022-01-11 NOTE — Patient Instructions (Signed)
Your provider has order Furoscix for you. This is an on-body infuser that gives you a dose of Furosemide.     Ensure you write down the time you start your infusion so that if there is a problem you will know how long the infusion lasted  Use Furoscix only AS DIRECTED by our office  Dosing Directions:   Day 1= use one furoscix infuser with 40 meq of potassium twice a day and metolazone 2.'5mg'$  x 1 dose  Day 2= use one furoscix infuser with 40 meq of potassium twice a day and metolazone 2.'5mg'$  x 1 dose  Day 3= use one furoscix infuser with 40 meq of potassium twice a day and metolazone 2.'5mg'$  x 1 dose  After three days of Furoscix resume normal dose or Torsemide 60 mg twice a day and Potassium 20 meq daily  Labs today We will only contact you if something comes back abnormal or we need to make some changes. Otherwise no news is good news!  Your physician recommends that you schedule a follow-up appointment in: 1 week   Do the following things EVERYDAY: Weigh yourself in the morning before breakfast. Write it down and keep it in a log. Take your medicines as prescribed Eat low salt foods--Limit salt (sodium) to 2000 mg per day.  Stay as active as you can everyday Limit all fluids for the day to less than 2 liters   At the [facility], you and your health needs are our priority. As part of our continuing mission to provide you with exceptional heart care, we have created designated Provider Care Teams. These Care Teams include your primary Cardiologist (physician) and Advanced Practice Providers (APPs- Physician Assistants and Nurse Practitioners) who all work together to provide you with the care you need, when you need it.   You may see any of the following providers on your designated Care Team at your next follow up: Dr Glori Bickers Dr Haynes Kerns, NP Lyda Jester, [place] [facility] [place], [place] Audry Riles, PharmD   Please be  sure to bring in all your medications bottles to every appointment.

## 2022-01-11 NOTE — Addendum Note (Signed)
Encounter addended by: Rafael Bihari, FNP on: 01/11/2022 4:54 PM  Actions taken: Clinical Note Signed

## 2022-01-11 NOTE — Progress Notes (Signed)
Medication Samples have been provided to the patient.  Drug name: Furoscix       Strength: '80mg'$         Qty: 2  LOT: [id number]  Exp.Date:11/08/2022/ 08/22/2023  Dosing instructions: Use as directed by clinic  The patient has been instructed regarding the correct time, dose, and frequency of taking this medication, including desired effects and most common side effects.   Cynthia Becker 4:06 PM 01/11/2022

## 2022-01-14 ENCOUNTER — Other Ambulatory Visit: Payer: Self-pay | Admitting: Nurse Practitioner

## 2022-01-14 DIAGNOSIS — G894 Chronic pain syndrome: Secondary | ICD-10-CM

## 2022-01-16 ENCOUNTER — Ambulatory Visit (HOSPITAL_COMMUNITY)
Admission: RE | Admit: 2022-01-16 | Discharge: 2022-01-16 | Disposition: A | Discharge status: Home / Self Care | Payer: Medicaid Other | Source: Ambulatory Visit | Attending: Family Medicine | Admitting: Family Medicine

## 2022-01-16 ENCOUNTER — Ambulatory Visit: Payer: Medicaid Other | Admitting: Pulmonary Disease

## 2022-01-16 ENCOUNTER — Encounter (HOSPITAL_COMMUNITY): Payer: Self-pay

## 2022-01-16 VITALS — BP 134/90 | HR 100 | Wt >= 6400 oz

## 2022-01-16 DIAGNOSIS — R002 Palpitations: Secondary | ICD-10-CM

## 2022-01-16 DIAGNOSIS — I5032 Chronic diastolic (congestive) heart failure: Secondary | ICD-10-CM | POA: Insufficient documentation

## 2022-01-16 LAB — BASIC METABOLIC PANEL
Anion gap: 10 (ref 5–15)
BUN: 28 mg/dL — ABNORMAL HIGH (ref 6–20)
CO2: 37 mmol/L — ABNORMAL HIGH (ref 22–32)
Calcium: 9.3 mg/dL (ref 8.9–10.3)
Chloride: 92 mmol/L — ABNORMAL LOW (ref 98–111)
Creatinine, Ser: 1.34 mg/dL — ABNORMAL HIGH (ref 0.44–1.00)
GFR, Estimated: 48 mL/min — ABNORMAL LOW (ref >60)
Glucose, Bld: 144 mg/dL — ABNORMAL HIGH (ref 70–99)
Potassium: 4.3 mmol/L (ref 3.5–5.1)
Sodium: 139 mmol/L (ref 135–145)

## 2022-01-16 LAB — MAGNESIUM: Magnesium: 1.8 mg/dL (ref 1.7–2.4)

## 2022-01-16 NOTE — Progress Notes (Signed)
ADVANCED HF CLINIC NOTE   PCP: Dionisio David NP  Pulmonology: Dr. Carlis Abbott Primary Cardiologist: Dr Virgina Jock  HF Cardiologist: Dr. Haroldine Laws  HPI:  Cynthia Becker is a 51 y.o. female referred by [name], NP for chronic diastolic CHF.  Has history of chronic diastolic CHF, HTN, morbid obesity, type II DM, HLD tobacco use, asthma, chronic respiratory failure with hypoxia on supplemental O2.    She's had multiple admissions over the last few years with a/c respiratory failure with hypoxia secondary to a/c diastolic CHF.    Echo 04/22: EF 60-65%, RV not well visualized, study technically difficult d/t body habitus.   Admitted 12/22 with a/c respiratory failure with hypoxia secondary to influenza A pneumonia and a/c CHF. Diuresed with IV lasix. Discharged on torsemide 40 mg daily. Course complicated by encephalopathy, felt to be likely d/t CO2 narcosis vs narcotic use.   Saw her PCP for f/u 06/12/21. Noted 20 lb weight gain after discharge. Torsemide increased to 40 mg bid. She was subsequently referred to [facility] for evaluation.  First seen in AHF clinic 09/21/21, markedly volume overloaded, weight 464 lbs. Spiro 12.5 added, and instructed to take metolazone 2x/week. Zio-14 day placed at follow up due to palpitations.  Follow up 6/23, she was volume overloaded with NYHA IIIb symptoms. Torsemide increased to 100 bid and metolazone added twice a week. Discussed using Furoscix in the future and started approval process.  Called clinic 01/04/22 after she had use a Furoscix cartridge. Used Furoscix x 3 doses 7/21, 7/22, and 7/23.  Today she returns for HF follow up with her husband and grandson. Used Furoscix and feels mildly better, breathing about the same. She remains SOB with minimal activity, but limited by knee OA. Continues with LE swelling, weight down 2 lbs. Denies palpitations, abnormal bleeding, CP, dizziness, or PND/Orthopnea. Appetite ok. No fever or chills.  Weight at home 459-465 pounds. Taking all medications. Urinated briskly on 100 bid of torsemide. Wears 2L oxygen at night, has not completed sleep study yet.  Cardiac Studies: - Echo (4/22): EF 60-65%, RV not well visualized, study technically difficult d/t body habitus.  - Echo (11/21): EF 60-65%, moderate LVH, grade II DD, RV normal  - Echo (2/21): EF 50-55%, grade II DD, moderate LVH, technically difficult study.  ROS: All systems reviewed and negative except as per HPI.   Past Medical History:  Diagnosis Date   Anemia    iv iron treatment dec 2014/ see oncology for this last in dec 2014   Anxiety    Arthritis    knee, hands   Asthma    seasonal related/ albuterol used when uri   CHF (congestive heart failure) (HCC)    CN (constipation) 09/14/2014   DEGENERATIVE DISC DISEASE, LUMBOSACRAL SPINE W/RADICULOPATHY 11/01/2009   Qualifier: Diagnosis of  By: Jorene Minors, Scott     Depression    Diabetes ([place])    H/O dizziness    Hx   Headache(784.0)    otc med prn   High blood pressure    HYPERLIPIDEMIA 02/13/2010   Qualifier: Diagnosis of  By: Versie Starks     INSOMNIA 08/24/2008   Qualifier: Diagnosis of  By: Radene Ou MD, [place]     Morbid obesity ([place]) 05/24/2008   Qualifier: Diagnosis of  By: Radene Ou MD, [place]     SVD (spontaneous vaginal delivery)    x 3   Current Outpatient Medications  Medication Sig Dispense Refill   ACCU-CHEK FASTCLIX LANCETS MISC USE  TO CHECK BLOOD SUGAR TWICE DAILY  11   ACCU-CHEK GUIDE test strip USE 1 STRIP TO CHECK BLOOD SUGAR THREE TIMES DAILY 250 each 0   albuterol (PROVENTIL) (2.5 MG/3ML) 0.083% nebulizer solution USE 1 VIAL IN NEBULIZER EVERY 6 HOURS AS NEEDED FOR WHEEZING OR SHORTNESS OF BREATH 150 mL 1   benzonatate (TESSALON) 200 MG capsule Take 1 capsule (200 mg total) by mouth 2 (two) times daily as needed for cough. 20 capsule 0   carvedilol (COREG) 3.125 MG tablet TAKE 1 TABLET BY MOUTH TWICE DAILY WITH A MEAL 60 tablet 0    cetirizine (ZYRTEC ALLERGY) 10 MG tablet Take 1 tablet (10 mg total) by mouth daily. 90 tablet 3   cyclobenzaprine (FLEXERIL) 5 MG tablet Take 1 tablet by mouth three times daily as needed for muscle spasm 90 tablet 0   gabapentin (NEURONTIN) 800 MG tablet TAKE 1 TABLET BY MOUTH THREE TIMES DAILY 270 tablet 0   hydrOXYzine (ATARAX) 10 MG tablet TAKE 1 TABLET BY MOUTH THREE TIMES DAILY AS NEEDED FOR ITCHING 30 tablet 0   ibuprofen (ADVIL) 800 MG tablet Take 800 mg by mouth every 8 (eight) hours as needed for moderate pain.     insulin glargine (LANTUS) 100 UNIT/ML Solostar Pen Inject 20 Units into the skin at bedtime. 15 mL 11   Insulin Pen Needle (PEN NEEDLES 31GX5/16") 31G X 8 MM MISC 1 pen by Does not apply route at bedtime. 100 each 2   linaclotide (LINZESS) 290 MCG CAPS capsule Take 1 capsule (290 mcg total) by mouth daily before breakfast. 30 capsule 3   metolazone (ZAROXOLYN) 2.5 MG tablet Take 1 tablet (2.5 mg total) by mouth 2 (two) times a week. On Wednesday and Saturday 20 tablet 3   naloxone (NARCAN) nasal spray 4 mg/0.1 mL Place 1 spray into the nose as needed.     omeprazole (PRILOSEC) 20 MG capsule Take 20 mg by mouth at bedtime.     Oxycodone HCl 10 MG TABS Take 10 mg by mouth every 4 (four) hours as needed (pain).     potassium chloride SA (KLOR-CON M) 20 MEQ tablet Take 1 tablet (20 mEq total) by mouth daily. 20 tablet 3   promethazine (PHENERGAN) 25 MG tablet Take 25 mg by mouth every 8 (eight) hours as needed for nausea or vomiting.     spironolactone (ALDACTONE) 25 MG tablet Take 1 tablet (25 mg total) by mouth daily. 30 tablet 3   torsemide (DEMADEX) 20 MG tablet Take 3 tablets (60 mg total) by mouth 2 (two) times daily. 565 tablet 2   Turmeric (QC TUMERIC COMPLEX PO) Take 500 mg by mouth daily.     vitamin B-12 (CYANOCOBALAMIN) 1000 MCG tablet Take 1,000 mcg by mouth daily.     No current facility-administered medications for this encounter.   No Known Allergies  Social  History   Socioeconomic History   Marital status: Married    Spouse name: Not on file   Number of children: 3   Years of education: Not on file   Highest education level: Not on file  Occupational History   Not on file  Tobacco Use   Smoking status: Former    Packs/day: 0.25    Types: Cigarettes    Quit date: 04/24/2020    Years since quitting: 1.7   Smokeless tobacco: Never   Tobacco comments:    4-5 cigarettes a day  Vaping Use   Vaping Use: Never used  Substance and  Sexual Activity   Alcohol use: No   Drug use: No   Sexual activity: Not on file  Other Topics Concern   Not on file  Social History Narrative   Not on file   Social Determinants of Health   Financial Resource Strain: Not on file  Food Insecurity: Not on file  Transportation Needs: Not on file  Physical Activity: Not on file  Stress: Not on file  Social Connections: Not on file  Intimate Partner Violence: Not on file   Family History  Problem Relation Age of Onset   Diabetes Mother    Hypertension Father    Heart disease Other    Diabetes Other    Hypertension Other    Alcohol abuse Other    Hypertension Brother    Asthma Brother    Hypertension Brother    Asthma Brother    COPD Maternal Grandmother    Lung cancer Maternal Grandmother    Asthma Paternal Grandmother    Asthma Paternal Aunt    BP 134/90   Pulse 100   Wt (!) 210.2 kg (463 lb 6.4 oz)   LMP 02/13/2014   SpO2 92%   BMI 77.11 kg/m   Wt Readings from Last 3 Encounters:  01/16/22 (!) 210.2 kg (463 lb 6.4 oz)  01/11/22 (!) 210.9 kg (465 lb)  11/23/21 (!) 208 kg (458 lb 9.6 oz)   PHYSICAL EXAM: General:  NAD. No resp difficulty, arrived in [facility], super morbid obese HEENT: Normal Neck: Supple. Thick neck. Carotids 2+ bilat; no bruits. No lymphadenopathy or thryomegaly appreciated. Cor: PMI nondisplaced. Regular rate & rhythm. No rubs, gallops or murmurs. Lungs: Clear Abdomen: Obese, nontender, nondistended. No  hepatosplenomegaly. No bruits or masses. Good bowel sounds. Extremities: No cyanosis, clubbing, rash, 1+ pre-tibial BLE edema w/ venous stasis changes to BLE Neuro: Alert & oriented x 3, cranial nerves grossly intact. Moves all 4 extremities w/o difficulty. Affect pleasant.  ASSESSMENT & PLAN: 1. Chronic HFpEF  - Echo (4/22): EF 60-65%, RV poorly visualized. - NYHA III-IIIb, functional status limited by body habitus and knee OA. Volume looks OK, LE likely venous stasis. - Place compression hose. - Continue torsemide 60 mg bid w/ 20 KCL + metolazone twice weekly (Wed and Fridays) - Continue spironolactone 25 mg daily.  - Continue carvedilol 3.125 mg bid. - Add ARB next if BP/labs ok. - No SGLT2i due hygeine.  - Discussed limiting fluid intake to < 2 liter per day and low salt food choices.  - Labs today. Repeat BMET ever 2-3 weeks x 2  - Update echo.  2. Palpitations - Resolved since last visit. Will hold off on replacing patch unless symptoms return. - Suspect fluid related (+/- frequent albuterol use).  3. Morbid Obesity - Body mass index is 77.11 kg/m. - Referred to [facility]. - Consider referral to PharmD for [place]. Bariatric surgery would be reasonable option.   4. DMII - Followed by PCP. - On insulin. - ? Ozempic for weight loss benefit.   5. OA - Suspect she is not a candidate for knee replacement. - Follows in pain clinic for injections and narcotics.  6. Nocturnal hypoxia - Continue 2L oxygen at night. - No OSA on sleep study (4/19), AHI 4.4  Follow up in 4 months with Dr. Haroldine Laws, will update echo.  Maricela Bo Milford FNP-BC 2:35 PM  Patient seen and examined with the above-signed Advanced Practice Provider and/or Housestaff. I personally reviewed laboratory data, imaging studies and relevant  notes. I independently examined the patient and formulated the important aspects of the plan. I have edited the note to reflect any of my changes or  salient points. I have personally discussed the plan with the patient and/or family.  Overall stable NYHA III-IIIB in setting of morbid obesity and MSK issues.. Volume status hard to assess with size but overall looks ok to me. Sleep study was negative.   General:  Sitting in chair No resp difficulty HEENT: normal Neck: supple. no JVD. Carotids 2+ bilat; no bruits. No lymphadenopathy or thryomegaly appreciated. Cor: PMI nondisplaced. Regular rate & rhythm. No rubs, gallops or murmurs. Lungs: clear Abdomen: obese soft, nontender, nondistended. No hepatosplenomegaly. No bruits or masses. Good bowel sounds. Extremities: no cyanosis, clubbing, rash, tr edema Neuro: alert & orientedx3, cranial nerves grossly intact. moves all 4 extremities w/o difficulty. Affect pleasant  Stable NYHA III-IIIB but doubt that limitation is primarily related to her cardiac status. Volume ok. Will repeat echo. Continue weight loss efforts. Place compression hose as able.   Glori Bickers, MD  10:50 PM

## 2022-01-16 NOTE — Patient Instructions (Signed)
There has been no changes to your medications.  Labs done today, your results will be available in MyChart, we will contact you for abnormal readings.  Your physician has requested that you have an echocardiogram. Echocardiography is a painless test that uses sound waves to create images of your heart. It provides your doctor with information about the size and shape of your heart and how well your heart's chambers and valves are working. This procedure takes approximately one hour. There are no restrictions for this procedure.  Your physician recommends that you schedule a follow-up appointment in: as scheduled.  If you have any questions or concerns before your next appointment please send [place] a message through [place] or call our office at [phone].    TO LEAVE A MESSAGE FOR THE NURSE SELECT OPTION 2, PLEASE LEAVE A MESSAGE INCLUDING: YOUR NAME DATE OF BIRTH CALL BACK NUMBER REASON FOR CALL**this is important as we prioritize the call backs  YOU WILL RECEIVE A CALL BACK THE SAME DAY AS LONG AS YOU CALL BEFORE 4:00 PM  At the [facility], you and your health needs are our priority. As part of our continuing mission to provide you with exceptional heart care, we have created designated Provider Care Teams. These Care Teams include your primary Cardiologist (physician) and Advanced Practice Providers (APPs- Physician Assistants and Nurse Practitioners) who all work together to provide you with the care you need, when you need it.   You may see any of the following providers on your designated Care Team at your next follow up: Dr Glori Bickers Dr Haynes Kerns, NP Lyda Jester, [place] [facility] [place], [place] Audry Riles, PharmD   Please be sure to bring in all your medications bottles to every appointment.

## 2022-01-19 ENCOUNTER — Other Ambulatory Visit: Payer: Self-pay | Admitting: Nurse Practitioner

## 2022-01-25 ENCOUNTER — Ambulatory Visit: Payer: Medicaid Other | Admitting: Pulmonary Disease

## 2022-01-30 ENCOUNTER — Ambulatory Visit (HOSPITAL_COMMUNITY)
Admission: RE | Admit: 2022-01-30 | Discharge: 2022-01-30 | Disposition: A | Discharge status: Home / Self Care | Payer: Medicaid Other | Source: Ambulatory Visit | Attending: Internal Medicine | Admitting: Internal Medicine

## 2022-01-30 DIAGNOSIS — I5032 Chronic diastolic (congestive) heart failure: Secondary | ICD-10-CM | POA: Diagnosis present

## 2022-01-30 LAB — BASIC METABOLIC PANEL
Anion gap: 10 (ref 5–15)
BUN: 24 mg/dL — ABNORMAL HIGH (ref 6–20)
CO2: 33 mmol/L — ABNORMAL HIGH (ref 22–32)
Calcium: 9 mg/dL (ref 8.9–10.3)
Chloride: 94 mmol/L — ABNORMAL LOW (ref 98–111)
Creatinine, Ser: 1.3 mg/dL — ABNORMAL HIGH (ref 0.44–1.00)
GFR, Estimated: 50 mL/min — ABNORMAL LOW (ref >60)
Glucose, Bld: 131 mg/dL — ABNORMAL HIGH (ref 70–99)
Potassium: 3.8 mmol/L (ref 3.5–5.1)
Sodium: 137 mmol/L (ref 135–145)

## 2022-02-01 ENCOUNTER — Other Ambulatory Visit (HOSPITAL_COMMUNITY): Payer: Self-pay | Admitting: Family Medicine

## 2022-02-13 ENCOUNTER — Other Ambulatory Visit (HOSPITAL_COMMUNITY): Payer: Medicaid Other

## 2022-02-15 ENCOUNTER — Other Ambulatory Visit (HOSPITAL_COMMUNITY): Payer: Medicaid Other

## 2022-02-18 ENCOUNTER — Other Ambulatory Visit: Payer: Self-pay | Admitting: Nurse Practitioner

## 2022-02-18 ENCOUNTER — Other Ambulatory Visit (HOSPITAL_COMMUNITY): Payer: Medicaid Other

## 2022-02-18 DIAGNOSIS — M5126 Other intervertebral disc displacement, lumbar region: Secondary | ICD-10-CM

## 2022-02-18 DIAGNOSIS — M79604 Pain in right leg: Secondary | ICD-10-CM

## 2022-02-19 ENCOUNTER — Ambulatory Visit (HOSPITAL_COMMUNITY)
Admission: RE | Admit: 2022-02-19 | Discharge: 2022-02-19 | Disposition: A | Discharge status: Home / Self Care | Payer: Medicaid Other | Source: Ambulatory Visit | Attending: Cardiology | Admitting: Cardiology

## 2022-02-19 DIAGNOSIS — I5032 Chronic diastolic (congestive) heart failure: Secondary | ICD-10-CM | POA: Insufficient documentation

## 2022-02-19 LAB — BASIC METABOLIC PANEL
Anion gap: 9 (ref 5–15)
BUN: 16 mg/dL (ref 6–20)
CO2: 33 mmol/L — ABNORMAL HIGH (ref 22–32)
Calcium: 9.3 mg/dL (ref 8.9–10.3)
Chloride: 97 mmol/L — ABNORMAL LOW (ref 98–111)
Creatinine, Ser: 1.15 mg/dL — ABNORMAL HIGH (ref 0.44–1.00)
GFR, Estimated: 58 mL/min — ABNORMAL LOW (ref >60)
Glucose, Bld: 135 mg/dL — ABNORMAL HIGH (ref 70–99)
Potassium: 4.1 mmol/L (ref 3.5–5.1)
Sodium: 139 mmol/L (ref 135–145)

## 2022-02-25 ENCOUNTER — Other Ambulatory Visit: Payer: Self-pay | Admitting: Nurse Practitioner

## 2022-02-25 DIAGNOSIS — I10 Essential (primary) hypertension: Secondary | ICD-10-CM

## 2022-03-01 ENCOUNTER — Other Ambulatory Visit: Payer: Self-pay | Admitting: Nurse Practitioner

## 2022-03-01 DIAGNOSIS — G894 Chronic pain syndrome: Secondary | ICD-10-CM

## 2022-03-04 ENCOUNTER — Ambulatory Visit: Payer: Medicaid Other | Admitting: Nurse Practitioner

## 2022-03-07 ENCOUNTER — Encounter (HOSPITAL_COMMUNITY): Payer: Medicaid Other | Admitting: Internal Medicine

## 2022-03-07 ENCOUNTER — Ambulatory Visit (HOSPITAL_COMMUNITY): Payer: Medicaid Other

## 2022-03-09 ENCOUNTER — Other Ambulatory Visit: Payer: Self-pay | Admitting: Nurse Practitioner

## 2022-03-09 DIAGNOSIS — K5909 Other constipation: Secondary | ICD-10-CM

## 2022-03-12 ENCOUNTER — Other Ambulatory Visit: Payer: Self-pay

## 2022-03-12 DIAGNOSIS — G894 Chronic pain syndrome: Secondary | ICD-10-CM

## 2022-03-12 MED ORDER — CYCLOBENZAPRINE HCL 5 MG PO TABS: 5.0000 mg | ORAL_TABLET | Freq: Three times a day (TID) | ORAL | 0 refills | Status: DC | PRN

## 2022-04-01 ENCOUNTER — Ambulatory Visit: Payer: Medicaid Other | Admitting: Nurse Practitioner

## 2022-04-02 ENCOUNTER — Telehealth: Payer: Self-pay

## 2022-04-02 NOTE — Telephone Encounter (Signed)
Patient called to check on FMLA forms. They are in your box to have it filled out. She states that we have had it for 14 days.

## 2022-04-03 NOTE — Telephone Encounter (Signed)
I need more information. What dates was she out of work and why? Thanks.

## 2022-04-05 NOTE — Telephone Encounter (Signed)
Provider filled out forms and patient has been notified.

## 2022-04-08 ENCOUNTER — Other Ambulatory Visit (HOSPITAL_COMMUNITY): Payer: Self-pay

## 2022-04-08 MED ORDER — METOLAZONE 2.5 MG PO TABS: 2.5000 mg | ORAL_TABLET | ORAL | 3 refills | Status: DC

## 2022-04-15 ENCOUNTER — Ambulatory Visit (HOSPITAL_BASED_OUTPATIENT_CLINIC_OR_DEPARTMENT_OTHER)
Admission: RE | Admit: 2022-04-15 | Discharge: 2022-04-15 | Disposition: A | Discharge status: Home / Self Care | Payer: Medicaid Other | Source: Ambulatory Visit | Attending: Internal Medicine | Admitting: Internal Medicine

## 2022-04-15 ENCOUNTER — Other Ambulatory Visit (HOSPITAL_COMMUNITY): Payer: Self-pay

## 2022-04-15 ENCOUNTER — Encounter: Payer: Self-pay | Admitting: Oncology

## 2022-04-15 ENCOUNTER — Ambulatory Visit (HOSPITAL_COMMUNITY)
Admission: RE | Admit: 2022-04-15 | Discharge: 2022-04-15 | Disposition: A | Discharge status: Home / Self Care | Payer: Medicaid Other | Source: Ambulatory Visit | Attending: Family Medicine | Admitting: Family Medicine

## 2022-04-15 ENCOUNTER — Telehealth (HOSPITAL_COMMUNITY): Payer: Self-pay

## 2022-04-15 VITALS — BP 150/100 | HR 93 | Wt >= 6400 oz

## 2022-04-15 DIAGNOSIS — J9611 Chronic respiratory failure with hypoxia: Secondary | ICD-10-CM | POA: Insufficient documentation

## 2022-04-15 DIAGNOSIS — Z9981 Dependence on supplemental oxygen: Secondary | ICD-10-CM | POA: Diagnosis not present

## 2022-04-15 DIAGNOSIS — M179 Osteoarthritis of knee, unspecified: Secondary | ICD-10-CM | POA: Diagnosis not present

## 2022-04-15 DIAGNOSIS — Z6841 Body Mass Index (BMI) 40.0 and over, adult: Secondary | ICD-10-CM

## 2022-04-15 DIAGNOSIS — R002 Palpitations: Secondary | ICD-10-CM | POA: Insufficient documentation

## 2022-04-15 DIAGNOSIS — Z79899 Other long term (current) drug therapy: Secondary | ICD-10-CM | POA: Diagnosis not present

## 2022-04-15 DIAGNOSIS — I5032 Chronic diastolic (congestive) heart failure: Secondary | ICD-10-CM

## 2022-04-15 DIAGNOSIS — E119 Type 2 diabetes mellitus without complications: Secondary | ICD-10-CM | POA: Diagnosis not present

## 2022-04-15 DIAGNOSIS — E785 Hyperlipidemia, unspecified: Secondary | ICD-10-CM | POA: Diagnosis not present

## 2022-04-15 DIAGNOSIS — I10 Essential (primary) hypertension: Secondary | ICD-10-CM | POA: Diagnosis not present

## 2022-04-15 DIAGNOSIS — I110 Hypertensive heart disease with heart failure: Secondary | ICD-10-CM | POA: Insufficient documentation

## 2022-04-15 DIAGNOSIS — Z794 Long term (current) use of insulin: Secondary | ICD-10-CM | POA: Insufficient documentation

## 2022-04-15 DIAGNOSIS — E0843 Diabetes mellitus due to underlying condition with diabetic autonomic (poly)neuropathy: Secondary | ICD-10-CM

## 2022-04-15 LAB — BRAIN NATRIURETIC PEPTIDE: B Natriuretic Peptide: 5.8 pg/mL (ref 0.0–100.0)

## 2022-04-15 LAB — BASIC METABOLIC PANEL
Anion gap: 13 (ref 5–15)
BUN: 25 mg/dL — ABNORMAL HIGH (ref 6–20)
CO2: 31 mmol/L (ref 22–32)
Calcium: 9.3 mg/dL (ref 8.9–10.3)
Chloride: 91 mmol/L — ABNORMAL LOW (ref 98–111)
Creatinine, Ser: 1.7 mg/dL — ABNORMAL HIGH (ref 0.44–1.00)
GFR, Estimated: 36 mL/min — ABNORMAL LOW (ref >60)
Glucose, Bld: 138 mg/dL — ABNORMAL HIGH (ref 70–99)
Potassium: 3.6 mmol/L (ref 3.5–5.1)
Sodium: 135 mmol/L (ref 135–145)

## 2022-04-15 LAB — ECHOCARDIOGRAM COMPLETE
AR max vel: 1.74 cm2
AV Area VTI: 1.68 cm2
AV Area mean vel: 1.66 cm2
AV Mean grad: 5 mmHg
AV Peak grad: 8.9 mmHg
Ao pk vel: 1.49 m/s
Area-P 1/2: 3.87 cm2
S' Lateral: 2.7 cm

## 2022-04-15 MED ORDER — ENTRESTO 49-51 MG PO TABS: 1.0000 | ORAL_TABLET | Freq: Two times a day (BID) | ORAL | 3 refills | Status: DC

## 2022-04-15 MED ORDER — POTASSIUM CHLORIDE CRYS ER 20 MEQ PO TBCR: 20.0000 meq | EXTENDED_RELEASE_TABLET | Freq: Every day | ORAL | 11 refills | Status: DC

## 2022-04-15 MED ORDER — PERFLUTREN LIPID MICROSPHERE
1.0000 mL | INTRAVENOUS | Status: DC | PRN
  Administered 2022-04-15: 2 mL via INTRAVENOUS

## 2022-04-15 NOTE — Patient Instructions (Signed)
Medication Changes:  START Entresto 49/51 mg Twice daily   Lab Work:  Labs done today, your results will be available in MyChart, we will contact you for abnormal readings.  Testing/Procedures:  none  Referrals:  none  Special Instructions // Education:  Do the following things EVERYDAY: Weigh yourself in the morning before breakfast. Write it down and keep it in a log. Take your medicines as prescribed Eat low salt foods--Limit salt (sodium) to 2000 mg per day.  Stay as active as you can everyday Limit all fluids for the day to less than 2 liters   Follow-Up in: 6 months, **PLEASE CALL OUR OFFICE IN FEBRUARY TO SCHEDULE THIS APPOINTMENT  At the [facility], you and your health needs are our priority. We have a designated team specialized in the treatment of Heart Failure. This Care Team includes your primary Heart Failure Specialized Cardiologist (physician), Advanced Practice Providers (APPs- Physician Assistants and Nurse Practitioners), and Pharmacist who all work together to provide you with the care you need, when you need it.   You may see any of the following providers on your designated Care Team at your next follow up:  Dr. Glori Bickers Dr. Loralie Champagne Dr. Roxana Hires, NP Lyda Jester, [place] [facility] [place], [place] Forestine Na, NP Audry Riles, PharmD   Please be sure to bring in all your medications bottles to every appointment.   Need to Contact us:  If you have any questions or concerns before your next appointment please send [place] a message through [place] or call our office at [phone].    TO LEAVE A MESSAGE FOR THE NURSE SELECT OPTION 2, PLEASE LEAVE A MESSAGE INCLUDING: YOUR NAME DATE OF BIRTH CALL BACK NUMBER REASON FOR CALL**this is important as we prioritize the call backs  YOU WILL RECEIVE A CALL BACK THE SAME DAY AS LONG AS YOU CALL BEFORE 4:00 PM

## 2022-04-15 NOTE — Telephone Encounter (Signed)
Advanced Heart Failure Patient Advocate Encounter  Prior authorization is required for Entresto  Submitted : 04/15/2022 [place] [employer] Conf: [id number] Lauralee Evener, CPhT Rx Patient Advocate Phone: [phone]

## 2022-04-15 NOTE — Progress Notes (Addendum)
ADVANCED HF CLINIC NOTE   PCP: Dionisio David NP  Pulmonology: Dr. Carlis Abbott Primary Cardiologist: Dr Virgina Jock  HF Cardiologist: Dr. Haroldine Laws  HPI:  Cynthia Becker is a 51 y.o. female referred by [name], NP for chronic diastolic CHF.  Has history of chronic diastolic CHF, HTN, morbid obesity, type II DM, HLD tobacco use, asthma, chronic respiratory failure with hypoxia on supplemental O2.    She's had multiple admissions over the last few years with a/c respiratory failure with hypoxia secondary to a/c diastolic CHF.    Echo 04/22: EF 60-65%, RV not well visualized, study technically difficult d/t body habitus.   Admitted 12/22 with a/c respiratory failure with hypoxia secondary to influenza A pneumonia and a/c CHF. Diuresed with IV lasix. Discharged on torsemide 40 mg daily. Course complicated by encephalopathy, felt to be likely d/t CO2 narcosis vs narcotic use.   Saw her PCP for f/u 06/12/21. Noted 20 lb weight gain after discharge. Torsemide increased to 40 mg bid. She was subsequently referred to [facility] for evaluation.  First seen in AHF clinic 09/21/21, markedly volume overloaded, weight 464 lbs. Spiro 12.5 added, and instructed to take metolazone 2x/week. Zio-14 day placed at follow up due to palpitations.  Follow up 6/23, she was volume overloaded with NYHA IIIb symptoms. Torsemide increased to 100 bid and metolazone added twice a week. Discussed using Furoscix in the future and started approval process.  Called clinic 01/04/22 after she had use a Furoscix cartridge. Used Furoscix x 3 doses 7/21, 7/22, and 7/23.  Today she returns for HF follow up with her husband and grandson. Says she continues to struggle with her fluid. Weight up and down. Says knees have been hurting. Can barely walk. Almost wheelchair bound.   Echo today 04/15/22 EF 60-65% RV ok Personally reviewed  Cardiac Studies: - Echo (4/22): EF 60-65%, RV not well visualized, study  technically difficult d/t body habitus.  - Echo (11/21): EF 60-65%, moderate LVH, grade II DD, RV normal  - Echo (2/21): EF 50-55%, grade II DD, moderate LVH, technically difficult study.  ROS: All systems reviewed and negative except as per HPI.   Past Medical History:  Diagnosis Date   Anemia    iv iron treatment dec 2014/ see oncology for this last in dec 2014   Anxiety    Arthritis    knee, hands   Asthma    seasonal related/ albuterol used when uri   CHF (congestive heart failure) (HCC)    CN (constipation) 09/14/2014   DEGENERATIVE DISC DISEASE, LUMBOSACRAL SPINE W/RADICULOPATHY 11/01/2009   Qualifier: Diagnosis of  By: Jorene Minors, Scott     Depression    Diabetes ([place])    H/O dizziness    Hx   Headache(784.0)    otc med prn   High blood pressure    HYPERLIPIDEMIA 02/13/2010   Qualifier: Diagnosis of  By: Jorene Minors, Scott     INSOMNIA 08/24/2008   Qualifier: Diagnosis of  By: Radene Ou MD, [place]     Morbid obesity ([place]) 05/24/2008   Qualifier: Diagnosis of  By: Radene Ou MD, [place]     SVD (spontaneous vaginal delivery)    x 3   Current Outpatient Medications  Medication Sig Dispense Refill   ACCU-CHEK FASTCLIX LANCETS MISC USE TO CHECK BLOOD SUGAR TWICE DAILY  11   ACCU-CHEK GUIDE test strip USE 1 STRIP TO CHECK BLOOD SUGAR THREE TIMES DAILY 250 each 0   albuterol (PROVENTIL) (2.5 MG/3ML) 0.083% nebulizer  solution USE 1 VIAL IN NEBULIZER EVERY 6 HOURS AS NEEDED FOR WHEEZING OR SHORTNESS OF BREATH 150 mL 1   carvedilol (COREG) 3.125 MG tablet TAKE 1 TABLET BY MOUTH TWICE DAILY WITH A MEAL 60 tablet 0   cyclobenzaprine (FLEXERIL) 5 MG tablet Take 1 tablet (5 mg total) by mouth 3 (three) times daily as needed. for muscle spams 90 tablet 0   EQ ALLERGY RELIEF, CETIRIZINE, 10 MG tablet Take 1 tablet by mouth once daily 90 tablet 0   gabapentin (NEURONTIN) 800 MG tablet TAKE 1 TABLET BY MOUTH THREE TIMES DAILY 270 tablet 0   hydrOXYzine (ATARAX) 10 MG tablet TAKE 1 TABLET  BY MOUTH THREE TIMES DAILY AS NEEDED FOR ITCHING 30 tablet 0   ibuprofen (ADVIL) 800 MG tablet Take 800 mg by mouth every 8 (eight) hours as needed for moderate pain.     insulin glargine (LANTUS) 100 UNIT/ML Solostar Pen Inject 20 Units into the skin at bedtime. 15 mL 11   Insulin Pen Needle (PEN NEEDLES 31GX5/16") 31G X 8 MM MISC 1 pen by Does not apply route at bedtime. 100 each 2   LINZESS 290 MCG CAPS capsule TAKE 1 CAPSULE BY MOUTH ONCE DAILY BEFORE BREAKFAST 30 capsule 0   metolazone (ZAROXOLYN) 2.5 MG tablet Take 1 tablet (2.5 mg total) by mouth 2 (two) times a week. On Wednesday and Saturday 20 tablet 3   naloxone (NARCAN) nasal spray 4 mg/0.1 mL Place 1 spray into the nose as needed.     omeprazole (PRILOSEC) 20 MG capsule Take 20 mg by mouth at bedtime.     oxyCODONE-acetaminophen (PERCOCET) 10-325 MG tablet Take 1 tablet by mouth every 4 (four) hours as needed for pain.     potassium chloride SA (KLOR-CON M) 20 MEQ tablet Take 1 tablet (20 mEq total) by mouth daily. 20 tablet 3   promethazine (PHENERGAN) 25 MG tablet Take 25 mg by mouth every 8 (eight) hours as needed for nausea or vomiting.     spironolactone (ALDACTONE) 25 MG tablet Take 1 tablet by mouth once daily 90 tablet 3   torsemide (DEMADEX) 20 MG tablet Take 3 tablets (60 mg total) by mouth 2 (two) times daily. 565 tablet 2   Turmeric (QC TUMERIC COMPLEX PO) Take 500 mg by mouth daily.     vitamin B-12 (CYANOCOBALAMIN) 1000 MCG tablet Take 1,000 mcg by mouth daily.     No current facility-administered medications for this encounter.   Facility-Administered Medications Ordered in Other Encounters  Medication Dose Route Frequency Provider Last Rate Last Admin   perflutren lipid microspheres (DEFINITY) IV suspension  1-10 mL Intravenous PRN Rafael Bihari, FNP   2 mL at 04/15/22 1514   No Known Allergies  Social History   Socioeconomic History   Marital status: Married    Spouse name: Not on file   Number of  children: 3   Years of education: Not on file   Highest education level: Not on file  Occupational History   Not on file  Tobacco Use   Smoking status: Former    Packs/day: 0.25    Types: Cigarettes    Quit date: 04/24/2020    Years since quitting: 1.9   Smokeless tobacco: Never   Tobacco comments:    4-5 cigarettes a day  Vaping Use   Vaping Use: Never used  Substance and Sexual Activity   Alcohol use: No   Drug use: No   Sexual activity: Not on file  Other Topics Concern   Not on file  Social History Narrative   Not on file   Social Determinants of Health   Financial Resource Strain: Not on file  Food Insecurity: Not on file  Transportation Needs: Not on file  Physical Activity: Not on file  Stress: Not on file  Social Connections: Not on file  Intimate Partner Violence: Not on file   Family History  Problem Relation Age of Onset   Diabetes Mother    Hypertension Father    Heart disease Other    Diabetes Other    Hypertension Other    Alcohol abuse Other    Hypertension Brother    Asthma Brother    Hypertension Brother    Asthma Brother    COPD Maternal Grandmother    Lung cancer Maternal Grandmother    Asthma Paternal Grandmother    Asthma Paternal Aunt    BP (!) 150/100   Pulse 93   Wt (!) 211 kg (465 lb 3.2 oz)   LMP 02/13/2014   SpO2 93%   BMI 77.41 kg/m   Wt Readings from Last 3 Encounters:  04/15/22 (!) 211 kg (465 lb 3.2 oz)  01/16/22 (!) 210.2 kg (463 lb 6.4 oz)  01/11/22 (!) 210.9 kg (465 lb)   PHYSICAL EXAM: General:  Morbidly obese woman in [facility] resp difficulty HEENT: normal Neck: supple. Unable to see jvp  Carotids 2+ bilat; no bruits. No lymphadenopathy or thryomegaly appreciated. Cor: PMI nondisplaced. Regular rate & rhythm. No rubs, gallops or murmurs. Lungs: clear Abdomen: obese soft, nontender, nondistended. Good bowel sounds. Extremities: no cyanosis, clubbing, rash, edema Neuro: alert & orientedx3, cranial nerves grossly  intact. moves all 4 extremities w/o difficulty. Affect pleasant   ASSESSMENT & PLAN:  1. Chronic HFpEF  - Echo (4/22): EF 60-65%, RV poorly visualized. - Echo today 04/15/22 EF 60-65% RV ok Personally reviewed - NYHA III-IIIb, functional status limited by body habitus and OA.  - Volume ok  - Continue torsemide 60 mg bid w/ 20 KCL + metolazone twice weekly (Wed and Saturdays) - Continue spironolactone 25 mg daily.  - Continue carvedilol 3.125 mg bid. - Add Entresto 49/51 bid - No SGLT2i due hygeine.  - Check labs - Long talk about need for weight loss or risk becoming bedbound in near future. Refer to PharmD for [place]  2. Palpitations - Resolved - Limit albuterol  3. Morbid Obesity - Body mass index is 77.41 kg/m. -  Long talk about need for weight loss or risk becoming bedbound in near future. Refer to PharmD for [place]   4. DMII - Followed by PCP. - On insulin. - ? Ozempic for weight loss benefit.   5. OA - She is not a candidate for knee replacemen due to obesity - Follows in pain clinic for injections and narcotics.  6. Nocturnal hypoxia - Continue 2L oxygen at night. - No OSA on sleep study (4/19), AHI 4.4  7. Essential HTN - BP up - Add Entresto 49/51 bid  Glori Bickers MD 3:29 PM

## 2022-04-16 NOTE — Telephone Encounter (Signed)
Advanced Heart Failure Patient Advocate Encounter  Prior Authorization for Delene Loll has been approved.   Effective: 04/15/22 to 04/15/2023.  Clista Bernhardt, CPhT Rx Patient Advocate Phone: [phone]

## 2022-04-19 NOTE — Addendum Note (Signed)
Encounter addended by: Scarlette Calico, RN on: 04/19/2022 4:00 PM  Actions taken: Visit diagnoses modified, Order list changed, Diagnosis association updated

## 2022-04-25 ENCOUNTER — Encounter: Payer: Self-pay | Admitting: Nurse Practitioner

## 2022-04-25 ENCOUNTER — Telehealth (INDEPENDENT_AMBULATORY_CARE_PROVIDER_SITE_OTHER): Payer: Medicaid Other | Admitting: Nurse Practitioner

## 2022-04-25 DIAGNOSIS — I10 Essential (primary) hypertension: Secondary | ICD-10-CM | POA: Diagnosis not present

## 2022-04-25 DIAGNOSIS — E0843 Diabetes mellitus due to underlying condition with diabetic autonomic (poly)neuropathy: Secondary | ICD-10-CM

## 2022-04-25 DIAGNOSIS — G894 Chronic pain syndrome: Secondary | ICD-10-CM

## 2022-04-25 DIAGNOSIS — L299 Pruritus, unspecified: Secondary | ICD-10-CM

## 2022-04-25 DIAGNOSIS — Z794 Long term (current) use of insulin: Secondary | ICD-10-CM

## 2022-04-25 MED ORDER — HYDROXYZINE HCL 10 MG PO TABS: ORAL_TABLET | ORAL | 0 refills | Status: DC

## 2022-04-25 MED ORDER — CARVEDILOL 3.125 MG PO TABS: 3.1250 mg | ORAL_TABLET | Freq: Two times a day (BID) | ORAL | 2 refills | Status: DC

## 2022-04-25 MED ORDER — BLOOD GLUCOSE MONITOR KIT: PACK | 0 refills | Status: AC

## 2022-04-25 MED ORDER — CYCLOBENZAPRINE HCL 5 MG PO TABS: 5.0000 mg | ORAL_TABLET | Freq: Three times a day (TID) | ORAL | 0 refills | Status: DC | PRN

## 2022-04-25 NOTE — Progress Notes (Signed)
Virtual Visit via Telephone Note  I connected with Cynthia Becker on 04/25/22 at  3:40 PM EDT by telephone and verified that I am speaking with the correct person using two identifiers.  Location: Patient: home Provider: office   I discussed the limitations, risks, security and privacy concerns of performing an evaluation and management service by telephone and the availability of in person appointments. I also discussed with the patient that there may be a patient responsible charge related to this service. The patient expressed understanding and agreed to proceed.   History of Present Illness:  Cynthia Becker is a 51 y.o. female referred by Tewana Passmore, NP for chronic diastolic CHF.  Has history of chronic diastolic CHF, HTN, morbid obesity, type II DM, HLD tobacco use, asthma, chronic respiratory failure with hypoxia on supplemental O2.   Patient presents today for follow-up visit.  This is a telephone visit.  Patient has recently followed up with cardiologist.  She is trying to lose weight.  Dr. Bensimhon has placed a referral for his pharmacist to start Ozempic for weight loss for this patient.  We will refer her to the weight management clinic as well.  Patient will return 1 day next week to check hemoglobin A1c.    Observations/Objective:     04/15/2022    3:17 PM 01/16/2022    2:27 PM 01/11/2022    3:38 PM  Vitals with BMI  Weight 465 lbs 3 oz 463 lbs 6 oz 465 lbs  Systolic 150 134 120  Diastolic 100 90 80  Pulse 93 100 96      Assessment and Plan:  1. 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 Use up to four times daily as directed.  Dispense: 1 each; Refill: 0 - Amb Ref to Medical Weight Management  2. Essential hypertension  - carvedilol (COREG) 3.125 MG tablet; Take 1 tablet (3.125  mg total) by mouth 2 (two) times daily with a meal.  Dispense: 60 tablet; Refill: 2  3. Chronic pain syndrome  - cyclobenzaprine (FLEXERIL) 5 MG tablet; Take 1 tablet (5 mg total) by mouth 3 (three) times daily as needed. for muscle spams  Dispense: 90 tablet; Refill: 0  4. Itching  - hydrOXYzine (ATARAX) 10 MG tablet; TAKE 1 TABLET BY MOUTH THREE TIMES DAILY AS NEEDED FOR ITCHING  Dispense: 30 tablet; Refill: 0  Follow up:  Follow up in 3 months or sooner if needed      I discussed the assessment and treatment plan with the patient. The patient was provided an opportunity to ask questions and all were answered. The patient agreed with the plan and demonstrated an understanding of the instructions.   The patient was advised to call back or seek an in-person evaluation if the symptoms worsen or if the condition fails to improve as anticipated.  I provided 23 minutes of non-face-to-face time during this encounter.    S , NP  

## 2022-04-25 NOTE — Addendum Note (Signed)
Addended by: Fenton Foy on: 04/25/2022 04:10 PM   Modules accepted: Level of Service

## 2022-04-25 NOTE — Patient Instructions (Signed)
1. 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 Use up to four times daily as directed.  Dispense: 1 each; Refill: 0 - Amb Ref to Medical Weight Management  2. Essential hypertension  - carvedilol (COREG) 3.125 MG tablet; Take 1 tablet (3.125 mg total) by mouth 2 (two) times daily with a meal.  Dispense: 60 tablet; Refill: 2  3. Chronic pain syndrome  - cyclobenzaprine (FLEXERIL) 5 MG tablet; Take 1 tablet (5 mg total) by mouth 3 (three) times daily as needed. for muscle spams  Dispense: 90 tablet; Refill: 0  4. Itching  - hydrOXYzine (ATARAX) 10 MG tablet; TAKE 1 TABLET BY MOUTH THREE TIMES DAILY AS NEEDED FOR ITCHING  Dispense: 30 tablet; Refill: 0  Follow up:  Follow up in 3 months or sooner if needed

## 2022-04-30 ENCOUNTER — Ambulatory Visit: Payer: Self-pay

## 2022-05-06 ENCOUNTER — Encounter (INDEPENDENT_AMBULATORY_CARE_PROVIDER_SITE_OTHER): Payer: Self-pay

## 2022-05-07 ENCOUNTER — Other Ambulatory Visit: Payer: Self-pay | Admitting: Nurse Practitioner

## 2022-05-12 ENCOUNTER — Other Ambulatory Visit: Payer: Self-pay | Admitting: Nurse Practitioner

## 2022-05-12 DIAGNOSIS — M5126 Other intervertebral disc displacement, lumbar region: Secondary | ICD-10-CM

## 2022-05-12 DIAGNOSIS — M79604 Pain in right leg: Secondary | ICD-10-CM

## 2022-05-13 ENCOUNTER — Ambulatory Visit: Payer: Self-pay

## 2022-05-20 ENCOUNTER — Ambulatory Visit (INDEPENDENT_AMBULATORY_CARE_PROVIDER_SITE_OTHER): Payer: Medicaid Other | Admitting: *Deleted

## 2022-05-20 ENCOUNTER — Encounter: Payer: Self-pay | Admitting: Nurse Practitioner

## 2022-05-20 DIAGNOSIS — E0843 Diabetes mellitus due to underlying condition with diabetic autonomic (poly)neuropathy: Secondary | ICD-10-CM | POA: Diagnosis not present

## 2022-05-20 LAB — POCT GLYCOSYLATED HEMOGLOBIN (HGB A1C): Hemoglobin A1C: 7.9 % — AB (ref 4.0–5.6)

## 2022-05-20 NOTE — Progress Notes (Signed)
Pt came in the office for POCT A1c--7.9.

## 2022-05-21 ENCOUNTER — Telehealth: Payer: Self-pay | Admitting: *Deleted

## 2022-05-21 NOTE — Telephone Encounter (Signed)
Notified pt. [employer] form and letter is complete and sign ready to be pick-up at the front office.

## 2022-05-24 ENCOUNTER — Telehealth: Payer: Self-pay

## 2022-05-24 NOTE — Telephone Encounter (Signed)
[facility] is requesting to fill pt gabapentin. Please advise [facility]

## 2022-05-27 ENCOUNTER — Other Ambulatory Visit: Payer: Self-pay | Admitting: Nurse Practitioner

## 2022-05-27 DIAGNOSIS — M79604 Pain in right leg: Secondary | ICD-10-CM

## 2022-05-27 DIAGNOSIS — M5126 Other intervertebral disc displacement, lumbar region: Secondary | ICD-10-CM

## 2022-05-27 MED ORDER — GABAPENTIN 800 MG PO TABS: 800.0000 mg | ORAL_TABLET | Freq: Three times a day (TID) | ORAL | 0 refills | Status: DC

## 2022-05-29 ENCOUNTER — Ambulatory Visit: Payer: Medicaid Other | Attending: Interventional Cardiology | Admitting: Pharmacist

## 2022-05-29 DIAGNOSIS — E0843 Diabetes mellitus due to underlying condition with diabetic autonomic (poly)neuropathy: Secondary | ICD-10-CM

## 2022-05-29 NOTE — Patient Instructions (Addendum)
Ozempic Counseling Points This medication reduces your appetite and may make you feel fuller longer.  Stop eating when your body tells you that you are full. This will likely happen sooner than you are used to. Store your medication in the fridge until you are ready to use it. Inject your medication in the fatty tissue of your lower abdominal area (2 inches away from belly button) or upper outer thigh. Rotate injection sites. Each pen will last you about 1 month (the first month it will last a few weeks longer). Use a different needle with each weekly injection. Common side effects include: nausea, diarrhea/constipation, and heartburn, and are more likely to occur if you overeat. Monitor your blood sugar at home - we will likely be able to cut back on your insulin dose as we go up on your Ozempic  Dosing schedule: - Month 1: Inject 0.'25mg'$  subcutaneously once weekly for 4 weeks - Month 2: Inject 0.'5mg'$  subcutaneously once weekly for 6 weeks - Month 3: Inject '1mg'$  subcutaneously once weekly for 4 weeks - Month 4: Inject '2mg'$  subcutaneously once weekly  Tips for living a healthier life     Building a Healthy and Balanced Diet Make most of your meal vegetables and fruits -  of your plate. Aim for color and variety, and remember that potatoes don't count as vegetables on the Healthy Eating Plate because of their negative impact on blood sugar.  Go for whole grains -  of your plate. Whole and intact grains--whole wheat, barley, wheat berries, quinoa, oats, brown rice, and foods made with them, such as whole wheat pasta--have a milder effect on blood sugar and insulin than white bread, white rice, and other refined grains.  Protein power -  of your plate. Fish, poultry, beans, and nuts are all healthy, versatile protein sources--they can be mixed into salads, and pair well with vegetables on a plate. Limit red meat, and avoid processed meats such as bacon and sausage.  Healthy plant oils - in  moderation. Choose healthy vegetable oils like olive, canola, soy, corn, sunflower, peanut, and others, and avoid partially hydrogenated oils, which contain unhealthy trans fats. Remember that low-fat does not mean "healthy."  Drink water, coffee, or tea. Skip sugary drinks, limit milk and dairy products to one to two servings per day, and limit juice to a small glass per day.  Stay active. The red figure running across the [place] is a reminder that staying active is also important in weight control.  The main message of the Healthy Eating Plate is to focus on diet quality:  The type of carbohydrate in the diet is more important than the amount of carbohydrate in the diet, because some sources of carbohydrate--like vegetables (other than potatoes), fruits, whole grains, and beans--are healthier than others. The Healthy Eating Plate also advises consumers to avoid sugary beverages, a major source of calories--usually with little nutritional value--in the American diet. The Healthy Eating Plate encourages consumers to use healthy oils, and it does not set a maximum on the percentage of calories people should get each day from healthy sources of fat. In this way, the Healthy Eating Plate recommends the opposite of the low-fat message promoted for decades by the USDA.  DeskDistributor.no  SUGAR  Sugar is a huge problem in the modern day diet. Sugar is a big contributor to heart disease, diabetes, high triglyceride levels, fatty liver disease and obesity. Sugar is hidden in almost all packaged foods/beverages. Added sugar is extra  sugar that is added beyond what is naturally found and has no nutritional benefit for your body. The American Heart Association recommends limiting added sugars to no more than 25g for women and 36 grams for men per day. There are many names for sugar including maltose, sucrose (names ending in "ose"),  high fructose corn syrup, molasses, cane sugar, corn sweetener, raw sugar, syrup, honey or fruit juice concentrate.   One of the best ways to limit your added sugars is to stop drinking sweetened beverages such as soda, sweet tea, and fruit juice.  There is 65g of added sugars in one 20oz bottle of Coke! That is equal to 7.5 donuts.   Pay attention and read all nutrition facts labels. Below is an examples of a nutrition facts label. The #1 is showing you the total sugars where the # 2 is showing you the added sugars. This one serving has almost the max amount of added sugars per day!     20 oz Soda 65g Sugar = 7.5 Glazed Donuts  16oz Energy  Drink 54g Sugar = 6.5 Glazed Donuts  Large Sweet  Tea 38g Sugar = 4 Glazed Donuts  20oz Sports  Drink 34g Sugar = 3.5 Glazed Donuts  8oz Chocolate Milk 24g Sugar =2.5 Glazed Donuts  8oz Orange  Juice 21g Sugar = 2 Glazed Donuts  1 Juice Box 14g Sugar = 1.5 Glazed Donuts  16oz Water= NO SUGAR!!  EXERCISE  Exercise is good. We've all heard that. In an ideal world, we would all have time and resources to get plenty of it. When you are active, your heart pumps more efficiently and you will feel better.  Multiple studies show that even walking regularly has benefits that include living a longer life. The American Heart Association recommends 150 minutes per week of exercise (30 minutes per day most days of the week). You can do this in any increment you wish. Nine or more 10-minute walks count. So does an hour-long exercise class. Break the time apart into what will work in your life. Some of the best things you can do include walking briskly, jogging, cycling or swimming laps. Not everyone is ready to "exercise." Sometimes we need to start with just getting active. Here are some easy ways to be more active throughout the day:  Take the stairs instead of the elevator  Go for a 10-15 minute walk during your lunch break (find a friend to make  it more enjoyable)  When shopping, park at the back of the parking lot  If you take public transportation, get off one stop early and walk the extra distance  Pace around while making phone calls  Check with your doctor if you aren't sure what your limitations may be. Always remember to drink plenty of water when doing any type of exercise. Don't feel like a failure if you're not getting the 90-150 minutes per week. If you started by being a couch potato, then just a 10-minute walk each day is a huge improvement. Start with little victories and work your way up.   HEALTHY EATING TIPS  When looking to improve your eating habits, whether to lose weight, lower blood pressure or just be healthier, it helps to know what a serving size is.   Grains 1 slice of bread,  bagel,  cup pasta or rice  Vegetables 1 cup fresh or raw vegetables,  cup cooked or canned Fruits 1 piece of medium sized fruit,  cup canned,  Meats/Proteins  cup dried       1 oz meat, 1 egg,  cup cooked beans, nuts or seeds  Dairy        Fats Individual yogurt container, 1 cup (8oz)    1 teaspoon margarine/butter or vegetable  milk or milk alternative, 1 slice of cheese          oil; 1 tablespoon mayonnaise or salad dressing                  Plan ahead: make a menu of the meals for a week then create a grocery list to go with that menu. Consider meals that easily stretch into a night of leftovers, such as stews or casseroles. Or consider making two of your favorite meal and put one in the freezer for another night. Try a night or two each week that is "meatless" or "no cook" such as salads. When you get home from the grocery store wash and prepare your vegetables and fruits. Then when you need them they are ready to go.   Tips for going to the grocery store:  [place] store or generic brands  Check the weekly ad from your store on-line or in their in-store flyer  Look at the unit price on the shelf tag to compare/contrast the  costs of different items  Buy fruits/vegetables in season  Carrots, bananas and apples are low-cost, naturally healthy items  If meats or frozen vegetables are on sale, buy some extras and put in your freezer  Limit buying prepared or "ready to eat" items, even if they are pre-made salads or fruit snacks  Do not shop when you're hungry  Foods at eye level tend to be more expensive. Look on the high and low shelves for deals.  Consider shopping at the farmer's market for fresh foods in season.  Avoid the cookie and chip aisles (these are expensive, high in calories and low in nutritional value). Shop on the outside of the grocery store.  Healthy food preparations:  If you can't get lean hamburger, be sure to drain the fat when cooking  Steam, saut (in olive oil), grill or bake foods  Experiment with different seasonings to avoid adding salt to your foods. Kosher salt, sea salt and Himalayan salt are all still salt and should be avoided. Try seasoning food with onion, garlic, thyme, rosemary, basil ect. Onion powder or garlic powder is ok. Avoid if it says salt (ie garlic salt).

## 2022-05-29 NOTE — Progress Notes (Signed)
Patient ID: Cynthia Becker                 DOB: [birth year]                    MRN: [id number]     HPI: Cynthia Becker is a 51 y.o. female patient referred to pharmacy clinic by Dr Cynthia Becker to initiate weight loss therapy with GLP1-RA. PMH is significant for obesity, chronic diastolic CHF, HTN, B3ZH, HLD, tobacco use, asthma, chronic respiratory failure with hypoxia on supplemental O2, almost wheelchair bound. Weight has fluctuated due to fluid status with her CHF, has been on torsemide, metolazone, then Furoscix. Most recent weight 465 lbs, BMI 77.4. Discussion at last CHF appt regarding need for weight loss to reduce risk of becoming bedbound in the near future. Not a candidate for knee replacement due to obesity, follows in pain clinic for injections/narcotics.  Pt presents today with her husband and grandson. Currently takes insulin for her diabetes. Reports fasting glucose 150, up to 180s in the afternoon. Previously took metformin which she stopped in 2021 secondary to nausea and vomiting. Weight fluctuates due to fluid status given her CHF as well.  Diet: tries to limit fried food. Likes juice a lot and drinks quite a bit, plans on trying to change to diet  Exercise: wheelchair bound  Family History: Mother with DM, father with HTN, brother with HTN and asthma.  Social History: Former tobacco use 1/4 PPD, quit in 2021, denies alcohol and drug use.  Labs: Lab Results  Component Value Date   HGBA1C 7.9 (A) 05/20/2022    Wt Readings from Last 1 Encounters:  04/15/22 (!) 465 lb 3.2 oz (211 kg)    BP Readings from Last 1 Encounters:  04/15/22 (!) 150/100   Pulse Readings from Last 1 Encounters:  04/15/22 93       Component Value Date/Time   CHOL 200 (H) 05/14/2021 1246   TRIG 99 05/14/2021 1246   HDL 41 05/14/2021 1246   CHOLHDL 4.9 (H) 05/14/2021 1246   CHOLHDL 4.4 Ratio 08/17/2010 2122   VLDL 18 08/17/2010 2122   LDLCALC 141 (H) 05/14/2021 1246    Past Medical History:   Diagnosis Date   Anemia    iv iron treatment dec 2014/ see oncology for this last in dec 2014   Anxiety    Arthritis    knee, hands   Asthma    seasonal related/ albuterol used when uri   CHF (congestive heart failure) ([place])    CN (constipation) 09/14/2014   DEGENERATIVE DISC DISEASE, LUMBOSACRAL SPINE W/RADICULOPATHY 11/01/2009   Qualifier: Diagnosis of  By: Jorene Minors, Scott     Depression    Diabetes ([place])    H/O dizziness    Hx   Headache(784.0)    otc med prn   High blood pressure    HYPERLIPIDEMIA 02/13/2010   Qualifier: Diagnosis of  By: Jorene Minors, Scott     INSOMNIA 08/24/2008   Qualifier: Diagnosis of  By: Radene Ou MD, [place]     Morbid obesity ([place]) 05/24/2008   Qualifier: Diagnosis of  By: Radene Ou MD, [place]     SVD (spontaneous vaginal delivery)    x 3    Current Outpatient Medications on File Prior to Visit  Medication Sig Dispense Refill   ACCU-CHEK FASTCLIX LANCETS MISC USE TO CHECK BLOOD SUGAR TWICE DAILY  11   ACCU-CHEK GUIDE test strip USE 1 STRIP TO CHECK BLOOD SUGAR THREE TIMES DAILY 250 each  0   albuterol (PROVENTIL) (2.5 MG/3ML) 0.083% nebulizer solution USE 1 VIAL IN NEBULIZER EVERY 6 HOURS AS NEEDED FOR WHEEZING OR SHORTNESS OF BREATH 150 mL 1   blood glucose meter kit and supplies KIT Dispense based on patient and insurance preference. Use up to four times daily as directed. 1 each 0   carvedilol (COREG) 3.125 MG tablet Take 1 tablet (3.125 mg total) by mouth 2 (two) times daily with a meal. 60 tablet 2   cyclobenzaprine (FLEXERIL) 5 MG tablet Take 1 tablet (5 mg total) by mouth 3 (three) times daily as needed. for muscle spams 90 tablet 0   EQ ALLERGY RELIEF, CETIRIZINE, 10 MG tablet Take 1 tablet by mouth once daily 90 tablet 0   gabapentin (NEURONTIN) 800 MG tablet Take 1 tablet (800 mg total) by mouth 3 (three) times daily. 270 tablet 0   hydrOXYzine (ATARAX) 10 MG tablet TAKE 1 TABLET BY MOUTH THREE TIMES DAILY AS NEEDED FOR ITCHING 30 tablet 0    ibuprofen (ADVIL) 800 MG tablet Take 800 mg by mouth every 8 (eight) hours as needed for moderate pain.     insulin glargine (LANTUS) 100 UNIT/ML Solostar Pen Inject 20 Units into the skin at bedtime. 15 mL 11   Insulin Pen Needle (PEN NEEDLES 31GX5/16") 31G X 8 MM MISC 1 pen by Does not apply route at bedtime. 100 each 2   LINZESS 290 MCG CAPS capsule TAKE 1 CAPSULE BY MOUTH ONCE DAILY BEFORE BREAKFAST 30 capsule 0   metolazone (ZAROXOLYN) 2.5 MG tablet Take 1 tablet (2.5 mg total) by mouth 2 (two) times a week. On Wednesday and Saturday 20 tablet 3   naloxone (NARCAN) nasal spray 4 mg/0.1 mL Place 1 spray into the nose as needed.     omeprazole (PRILOSEC) 20 MG capsule Take 20 mg by mouth at bedtime.     oxyCODONE-acetaminophen (PERCOCET) 10-325 MG tablet Take 1 tablet by mouth every 4 (four) hours as needed for pain.     potassium chloride SA (KLOR-CON M) 20 MEQ tablet Take 1 tablet (20 mEq total) by mouth daily. 30 tablet 11   promethazine (PHENERGAN) 25 MG tablet Take 25 mg by mouth every 8 (eight) hours as needed for nausea or vomiting.     sacubitril-valsartan (ENTRESTO) 49-51 MG Take 1 tablet by mouth 2 (two) times daily. 60 tablet 3   spironolactone (ALDACTONE) 25 MG tablet Take 1 tablet by mouth once daily 90 tablet 3   torsemide (DEMADEX) 20 MG tablet Take 3 tablets (60 mg total) by mouth 2 (two) times daily. 565 tablet 2   Turmeric (QC TUMERIC COMPLEX PO) Take 500 mg by mouth daily.     vitamin B-12 (CYANOCOBALAMIN) 1000 MCG tablet Take 1,000 mcg by mouth daily.     No current facility-administered medications on file prior to visit.    No Known Allergies   Assessment/Plan:  1. T2DM and weight loss - A1c 7.9% on insulin 20u HS, current glucose ranging 150-180s at home. Discussed glycemic, cardiovascular, and weight loss benefits associated with GLP1RA. She has traditional Medicaid which prefers Ozempic for coverage. She previously tried metformin which she stopped secondary to  GI upset. Reviewed injection technique and GI side effect profile with Ozempic. She is interested in starting medication. Plans to cut back on juice intake. Unable to exercise as she is primarily wheelchair-bound. She will keep an eye on her glucose, ideally will be able to cut back on her insulin dose as Ozempic dose  increases. Will submit prior auth for Ozempic and follow up with pt once approved. Will plan for monthly dose titrations.  Titration Plan:  Will plan to follow the titration plan as below, pending patient is tolerating each dose before increasing to the next. Can slow titration if needed for tolerability.    -Month 1: Inject Ozempic 0.50m SQ once weekly x 4 weeks -Month 2: Inject Ozempic 0.565mSQ once weekly x 6 weeks -Month 3: Inject Ozempic 23m48mQ once weekly x 4 weeks -Month 4+: Inject Ozempic 2mg36m once weekly   Follow up in 1 month via phone for dose titration.  Maily Debarge E. Belkys Henault, PharmD, BCACP, CPP [id number] [id number]: [phone]: [phone]/2023 3:18 PM

## 2022-05-31 ENCOUNTER — Telehealth: Payer: Self-pay | Admitting: Pharmacist

## 2022-05-31 MED ORDER — OZEMPIC (0.25 OR 0.5 MG/DOSE) 2 MG/3ML ~~LOC~~ SOPN: PEN_INJECTOR | SUBCUTANEOUS | 1 refills | Status: DC

## 2022-05-31 NOTE — Telephone Encounter (Signed)
Ozempic prior authorization approved through 05/29/23. Rx sent to pharmacy, called pt at [phone] per her request and left her a message of approval. Will call her in a month for dose titration.

## 2022-06-12 ENCOUNTER — Other Ambulatory Visit: Payer: Self-pay | Admitting: Nurse Practitioner

## 2022-06-12 DIAGNOSIS — G894 Chronic pain syndrome: Secondary | ICD-10-CM

## 2022-06-26 ENCOUNTER — Telehealth: Payer: Self-pay | Admitting: Pharmacist

## 2022-06-26 MED ORDER — OZEMPIC (0.25 OR 0.5 MG/DOSE) 2 MG/3ML ~~LOC~~ SOPN: 0.5000 mg | PEN_INJECTOR | SUBCUTANEOUS | 0 refills | Status: DC

## 2022-06-26 NOTE — Telephone Encounter (Signed)
Called pt on preferred # [phone] to follow up with Ozempic tolerability and dose titration, however the person who answered the phone stated they only spoke [place]. Tried calling pt's # listed in Epic but goes straight to voicemail without ringing. Left pt a message. Will also send her a message in [place].

## 2022-06-26 NOTE — Telephone Encounter (Signed)
Pt returned call. States the # she provided me with was incorrect. Prefers home # listed but she doesn't answer because she gets so many spam calls, and to leave a message and she'll call back.  Has given 4 Ozempic injections so far. This Friday will be due for first dose of 0.'5mg'$ . Reports she has lost 13 lbs so far. Getting full quicker, eating smaller portions. Greasy food and caffeine have been upsetting her stomach so she's cut back on them. Cut back on juice as well, now drinking only 1 cup a day. Drinking a lot more water. Eating more fruit. Will be a bit nauseous for the first 2 days or so after an Ozempic injection, then symptoms resolve.  Hasn't been checking her glucose every day. When she does check it, noticed fasting decreased to 110-120, down from 150 before. Post prandials used to be 190. Reports she's skipped her Lantus if her fasting glucose is 110-120 and will only take it if it's around 150. Has taken Lantus 3x in the past week.  Advised pt to increase her Ozempic to 0.'5mg'$  weekly for the next 6 weeks, refill sent in. Advised her to reduce her Lantus to 10u daily. If she notices lows, she will call me and we can reduce further.

## 2022-06-26 NOTE — Addendum Note (Signed)
Addended by: Aristotelis Vilardi E on: 06/26/2022 11:02 AM   Modules accepted: Orders

## 2022-07-01 ENCOUNTER — Other Ambulatory Visit: Payer: Self-pay

## 2022-07-22 ENCOUNTER — Other Ambulatory Visit: Payer: Self-pay | Admitting: Nurse Practitioner

## 2022-07-22 DIAGNOSIS — I10 Essential (primary) hypertension: Secondary | ICD-10-CM

## 2022-07-31 ENCOUNTER — Telehealth: Payer: Self-pay

## 2022-07-31 ENCOUNTER — Telehealth: Payer: Self-pay | Admitting: Pharmacist

## 2022-07-31 ENCOUNTER — Other Ambulatory Visit: Payer: Self-pay | Admitting: Pharmacist

## 2022-07-31 ENCOUNTER — Other Ambulatory Visit (HOSPITAL_COMMUNITY): Payer: Self-pay

## 2022-07-31 ENCOUNTER — Encounter: Payer: Self-pay | Admitting: Oncology

## 2022-07-31 MED ORDER — SEMAGLUTIDE (1 MG/DOSE) 4 MG/3ML ~~LOC~~ SOPN: 1.0000 mg | PEN_INJECTOR | SUBCUTANEOUS | 1 refills | Status: DC

## 2022-07-31 MED ORDER — SEMAGLUTIDE (1 MG/DOSE) 4 MG/3ML ~~LOC~~ SOPN: 1.0000 mg | PEN_INJECTOR | SUBCUTANEOUS | 0 refills | Status: DC

## 2022-07-31 NOTE — Telephone Encounter (Signed)
Pharmacy Patient Advocate Encounter  Prior Authorization for [facility] '4MG'$ /3ML has been approved.    Effective dates: 07/31/22 through 07/31/23   Received notification from MEDICAID that prior authorization for [facility] '4MG'$ /3ML is needed.    PA submitted on 07/31/22 CONFIRMATION# [id number] W Status is pending  Karie Soda, CPhT Pharmacy Patient Advocate Specialist Direct Number: [phone] Fax: [phone]

## 2022-07-31 NOTE — Telephone Encounter (Signed)
Patient called back. Reported doing well on Ozmepic. She has a little nausea if she eats "the wrong thing." Cannot tolerate fried foods. Other than that, no side effects. Fasting blood sugars are steady at 115. She is ready to increase to '1mg'$ . She has 2 shots of 0.'5mg'$  left. Advised that when she increased to '1mg'$ , she can stop the lantus.

## 2022-07-31 NOTE — Telephone Encounter (Signed)
Called pt to follow up with Ozempic dose titration and left message.  If tolerating well, will increase Ozempic to '1mg'$  weekly. Will also need to confirm if she's been taking Lantus 10u daily and see how her glucose readings have been (was previously skipping doses of her 20u daily when glucose was 110-120 so I decreased to 10u daily on 06/26/22). Hopefully will be able to decrease Lantus further.

## 2022-08-21 ENCOUNTER — Other Ambulatory Visit: Payer: Self-pay | Admitting: Nurse Practitioner

## 2022-08-21 DIAGNOSIS — M5126 Other intervertebral disc displacement, lumbar region: Secondary | ICD-10-CM

## 2022-08-21 DIAGNOSIS — M79604 Pain in right leg: Secondary | ICD-10-CM

## 2022-08-22 NOTE — Telephone Encounter (Signed)
Please advise [facility] 

## 2022-08-28 ENCOUNTER — Telehealth: Payer: Self-pay | Admitting: Pharmacist

## 2022-08-28 NOTE — Telephone Encounter (Signed)
Called pt and left message to follow up with Ozempic tolerability and glucose readings - had stopped Lantus when her Ozempic was increased to '1mg'$ . If tolerating well, will increase to '2mg'$  weekly dose.

## 2022-08-30 MED ORDER — OZEMPIC (2 MG/DOSE) 8 MG/3ML ~~LOC~~ SOPN: 2.0000 mg | PEN_INJECTOR | SUBCUTANEOUS | 11 refills | Status: DC

## 2022-08-30 NOTE — Telephone Encounter (Signed)
Spoke with pt. Blood sugar 115-120 fasting in the AM, a little higher than normal, had been 108 before but she was on insulin then and has since discontinued. No GI upset, doesn't tolerate greasy foods so has been trying to avoid. Reports weight not coming off as much now, baseline weight was 474 lbs and currently weighing 447 lbs at home - weighs on Fridays when she injects her Ozempic.  Will increase to '2mg'$  weekly maintenance dose, should help a bit more with weight loss and glucose readings. I'll pt once more in a month for an update on both, then will plan to continue at '2mg'$  maintenance dose.

## 2022-08-30 NOTE — Telephone Encounter (Signed)
2nd message left for pt.

## 2022-09-02 NOTE — Telephone Encounter (Signed)
Pt called and stated she accidentally picked up '1mg'$  dose of Ozempic. Will continue this for an extra month then go up to '2mg'$  maintenance dose, pharmacy already has that rx on file.

## 2022-09-27 NOTE — Telephone Encounter (Signed)
Pt called stating her pharmacy is telling her that her ozempic needs a PA. In Feb we got the PA approved through 2/25. Patient states she told pharmacy this but they are saying it needs a PA. Dorathy Daft or [place] can you look into this?

## 2022-09-30 ENCOUNTER — Telehealth: Payer: Self-pay

## 2022-09-30 ENCOUNTER — Encounter: Payer: Self-pay | Admitting: Oncology

## 2022-09-30 ENCOUNTER — Other Ambulatory Visit (HOSPITAL_COMMUNITY): Payer: Self-pay

## 2022-09-30 NOTE — Telephone Encounter (Signed)
Pharmacy Patient Advocate Encounter   Received notification from PharmD that prior authorization for (OZEMPIC, 2 MG/DOSE,) 8 MG/3ML is required/requested.     PA submitted on 4.8.24 to (ins) [place] MEDICAID via FAXED DOCUMENT  Status is pending

## 2022-10-03 ENCOUNTER — Other Ambulatory Visit (HOSPITAL_COMMUNITY): Payer: Self-pay

## 2022-10-03 MED ORDER — OZEMPIC (2 MG/DOSE) 8 MG/3ML ~~LOC~~ SOPN: 2.0000 mg | PEN_INJECTOR | SUBCUTANEOUS | 11 refills | Status: DC

## 2022-10-03 NOTE — Telephone Encounter (Signed)
Refill sent in

## 2022-10-03 NOTE — Addendum Note (Signed)
Addended by: Jaramiah Bossard E on: 10/03/2022 11:03 AM   Modules accepted: Orders

## 2022-10-03 NOTE — Telephone Encounter (Signed)
Pharmacy Patient Advocate Encounter  Prior Authorization for [facility], 2 MG/DOSE,) 8 MG/3ML  has been approved by [place] MEDICAID (ins).    PA # [id number] Effective dates: 4.8.23 through 4.3.25

## 2022-10-07 NOTE — Telephone Encounter (Signed)
Confirmed patient was able to get medications.

## 2022-10-28 ENCOUNTER — Telehealth: Payer: Self-pay | Admitting: Pharmacist

## 2022-10-28 NOTE — Telephone Encounter (Signed)
Called pt to follow up with tolerability of 2mg  Ozempic dose. Has been noticing decreased appetite. Lost 5 lbs last month, slower than she would like. No side effects. Glucose up and down, low of 85 last week and high of 150. More recently 118 and 121. Off of her insulin which is excellent. Still drinking some juice, discussed decreasing this. Home weight 445 which is 20 lbs lower than her baseline weight. She is aware to continue Ozempic 2mg  weekly and will call with any issues.

## 2022-11-06 ENCOUNTER — Other Ambulatory Visit: Payer: Self-pay

## 2022-11-06 NOTE — Progress Notes (Signed)
   Cynthia Becker 1970-08-24 [id number]  Patient outreached by Sharion Dove, PharmD Candidate on 11/06/22 while they were picking up prescriptions at [facility].  Blood Pressure Readings: Last documented ambulatory systolic blood pressure: 150 Last documented ambulatory diastolic blood pressure: 100 Does the patient have a validated home blood pressure machine?: Yes. Patient reports not checking BP at home regularly but wants to improve on doing it twice daily. Patient reports the times that they have checked their BP their systolic was in the 120s and diastolic was in the 70s.   Medication review was performed. Is the patient taking their medications as prescribed?: Yes Patient requesting refills on medications that are due. Patient reports bad nausea and vomiting on the Ozempic 2 mg and has had to significantly change diet. Patient also states feeling severe neuropathy and pain medications have been helping a little.   The following barriers to adherence were noted: Does the patient have cost concerns?: No Does the patient have transportation concerns?: No Does the patient need assistance obtaining refills?: Yes Does the patient occassionally forget to take some of their prescribed medications?: Yes (sets alarms to remember) Does the patient feel like one/some of their medications make them feel poorly?: Yes (ozempic nausea/vomiting has been pretty bad this month) Does the patient have questions or concerns about their medications?: No Does the patient have a follow up scheduled with their primary care provider/cardiologist?: Yes   Interventions: Interventions Completed: Medications were reviewed, Patient was educated on medications, including indication and administration, Patient was educated on use of adherence strategies, like a pill box or alarms, Patient was educated on proper technique to check home blood pressure and reminded to bring home machine and readings to  next provider appointment, Patient was educated on goal blood pressures and long term health implications of elevated blood pressure  The patient has follow up scheduled:  PCP: Ivonne Andrew, NP   Sharion Dove, Student-PharmD

## 2022-11-08 ENCOUNTER — Ambulatory Visit (INDEPENDENT_AMBULATORY_CARE_PROVIDER_SITE_OTHER): Payer: Medicaid Other | Admitting: Nurse Practitioner

## 2022-11-08 ENCOUNTER — Encounter: Payer: Self-pay | Admitting: Nurse Practitioner

## 2022-11-08 VITALS — BP 106/86 | HR 91 | Temp 97.2°F | Wt >= 6400 oz

## 2022-11-08 DIAGNOSIS — M25562 Pain in left knee: Secondary | ICD-10-CM

## 2022-11-08 DIAGNOSIS — M25561 Pain in right knee: Secondary | ICD-10-CM | POA: Diagnosis not present

## 2022-11-08 DIAGNOSIS — G8929 Other chronic pain: Secondary | ICD-10-CM | POA: Diagnosis not present

## 2022-11-08 MED ORDER — KETOROLAC TROMETHAMINE 30 MG/ML IJ SOLN
30.0000 mg | Freq: Once | INTRAMUSCULAR | Status: AC
  Administered 2022-11-08: 30 mg via INTRAMUSCULAR

## 2022-11-08 NOTE — Progress Notes (Signed)
@Patient  ID: Cynthia Becker, female    DOB: 18-Dec-1970, 52 y.o.   MRN: [id number]  Chief Complaint  Patient presents with   Leg Pain    Pressure on both legs started 3 weeks ago    Referring provider: Ivonne Andrew, NP   HPI  Cynthia Becker is a 52 y.o. female referred by Orion Crook, NP for chronic diastolic CHF.  Has history of chronic diastolic CHF, HTN, morbid obesity, type II DM, HLD tobacco use, asthma, chronic respiratory failure with hypoxia on supplemental O2.   Patient presents today for an acute visit.  She states that she has been having acute on chronic leg pain for the past 3 weeks.  She states that she feels more pressure in both of her knees.  Patient has been started on Ozempic as been trying to lose weight.  She has been referred to weight management clinic.  She is following with pain management for chronic knee pain.  She has not been seen by Ortho for her knee pain.  She states that the pain is greater on the right side.  We will get updated x-rays today.  We will give Toradol injection today.  We will place referral to Ortho for evaluation.  It does appear the patient has fluid on her knees. Denies f/c/s, n/v/d, hemoptysis, PND, leg swelling Denies chest pain or edema    No Known Allergies  Immunization History  Administered Date(s) Administered   Influenza Whole 04/25/2010   Influenza,inj,Quad PF,6+ Mos 03/07/2014, 03/10/2019   Pneumococcal Polysaccharide-23 04/13/2014   Td 08/02/2010    Past Medical History:  Diagnosis Date   Anemia    iv iron treatment dec 2014/ see oncology for this last in dec 2014   Anxiety    Arthritis    knee, hands   Asthma    seasonal related/ albuterol used when uri   CHF (congestive heart failure) (HCC)    CN (constipation) 09/14/2014   DEGENERATIVE DISC DISEASE, LUMBOSACRAL SPINE W/RADICULOPATHY 11/01/2009   Qualifier: Diagnosis of  By: Huntley Dec, Scott     Depression    Diabetes (HCC)    H/O dizziness    Hx    Headache(784.0)    otc med prn   High blood pressure    HYPERLIPIDEMIA 02/13/2010   Qualifier: Diagnosis of  By: Huntley Dec, Scott     INSOMNIA 08/24/2008   Qualifier: Diagnosis of  By: Barbaraann Barthel MD, [place]     Morbid obesity (HCC) 05/24/2008   Qualifier: Diagnosis of  By: Barbaraann Barthel MD, [place]     SVD (spontaneous vaginal delivery)    x 3    Tobacco History: Social History   Tobacco Use  Smoking Status Former   Packs/day: .25   Types: Cigarettes   Quit date: 04/24/2020   Years since quitting: 2.5  Smokeless Tobacco Never  Tobacco Comments   4-5 cigarettes a day   Counseling given: Not Answered Tobacco comments: 4-5 cigarettes a day   Outpatient Encounter Medications as of 11/08/2022  Medication Sig   ACCU-CHEK FASTCLIX LANCETS MISC USE TO CHECK BLOOD SUGAR TWICE DAILY   ACCU-CHEK GUIDE test strip USE 1 STRIP TO CHECK BLOOD SUGAR THREE TIMES DAILY   albuterol (PROVENTIL) (2.5 MG/3ML) 0.083% nebulizer solution USE 1 VIAL IN NEBULIZER EVERY 6 HOURS AS NEEDED FOR WHEEZING OR SHORTNESS OF BREATH   blood glucose meter kit and supplies KIT Dispense based on patient and insurance preference. Use up to four times daily as directed.  carvedilol (COREG) 3.125 MG tablet TAKE 1 TABLET BY MOUTH TWICE DAILY WITH A MEAL   cyclobenzaprine (FLEXERIL) 5 MG tablet Take 1 tablet (5 mg total) by mouth 3 (three) times daily as needed. for muscle spams   EQ ALLERGY RELIEF, CETIRIZINE, 10 MG tablet Take 1 tablet by mouth once daily   gabapentin (NEURONTIN) 800 MG tablet TAKE 1 TABLET BY MOUTH THREE TIMES DAILY   hydrOXYzine (ATARAX) 10 MG tablet TAKE 1 TABLET BY MOUTH THREE TIMES DAILY AS NEEDED FOR ITCHING   ibuprofen (ADVIL) 800 MG tablet Take 800 mg by mouth every 8 (eight) hours as needed for moderate pain.   LINZESS 290 MCG CAPS capsule TAKE 1 CAPSULE BY MOUTH ONCE DAILY BEFORE BREAKFAST   metolazone (ZAROXOLYN) 2.5 MG tablet Take 1 tablet (2.5 mg total) by mouth 2 (two) times a week. On Wednesday  and Saturday   naloxone [facility]) nasal spray 4 mg/0.1 mL Place 1 spray into the nose as needed.   oxyCODONE-acetaminophen (PERCOCET) 10-325 MG tablet Take 1 tablet by mouth every 4 (four) hours as needed for pain.   potassium chloride SA (KLOR-CON M) 20 MEQ tablet Take 1 tablet (20 mEq total) by mouth daily.   promethazine (PHENERGAN) 25 MG tablet Take 25 mg by mouth every 8 (eight) hours as needed for nausea or vomiting.   sacubitril-valsartan (ENTRESTO) 49-51 MG Take 1 tablet by mouth 2 (two) times daily.   Semaglutide, 2 MG/DOSE, (OZEMPIC, 2 MG/DOSE,) 8 MG/3ML SOPN Inject 2 mg into the skin once a week.   spironolactone (ALDACTONE) 25 MG tablet Take 1 tablet by mouth once daily   torsemide (DEMADEX) 20 MG tablet Take 3 tablets (60 mg total) by mouth 2 (two) times daily.   vitamin B-12 (CYANOCOBALAMIN) 1000 MCG tablet Take 1,000 mcg by mouth daily.   omeprazole (PRILOSEC) 20 MG capsule Take 20 mg by mouth at bedtime. (Patient not taking: Reported on 11/08/2022)   Turmeric (QC TUMERIC COMPLEX PO) Take 500 mg by mouth daily. (Patient not taking: Reported on 11/06/2022)   Facility-Administered Encounter Medications as of 11/08/2022  Medication   ketorolac (TORADOL) 30 MG/ML injection 30 mg     Review of Systems  Review of Systems  Constitutional: Negative.   HENT: Negative.    Cardiovascular: Negative.   Gastrointestinal: Negative.   Musculoskeletal:        Knee pain  Allergic/Immunologic: Negative.   Neurological: Negative.   Psychiatric/Behavioral: Negative.         Physical Exam  BP 106/86   Pulse 91   Temp (!) 97.2 F (36.2 C)   Wt (!) 440 lb (199.6 kg)   LMP 02/13/2014   SpO2 96%   BMI 73.22 kg/m   Wt Readings from Last 5 Encounters:  11/08/22 (!) 440 lb (199.6 kg)  04/15/22 (!) 465 lb 3.2 oz (211 kg)  01/16/22 (!) 463 lb 6.4 oz (210.2 kg)  01/11/22 (!) 465 lb (210.9 kg)  11/23/21 (!) 458 lb 9.6 oz (208 kg)     Physical Exam Vitals and nursing note reviewed.   Constitutional:      General: She is not in acute distress.    Appearance: She is well-developed.  Cardiovascular:     Rate and Rhythm: Normal rate and regular rhythm.  Pulmonary:     Effort: Pulmonary effort is normal.     Breath sounds: Normal breath sounds.  Musculoskeletal:     Right knee: Swelling present. Tenderness present.     Left knee: Swelling  present. Tenderness present.       Legs:  Neurological:     Mental Status: She is alert and oriented to person, place, and time.      Lab Results:  CBC    Component Value Date/Time   WBC 6.7 05/20/2021 0323   RBC 6.32 (H) 05/20/2021 0323   HGB 15.6 (H) 05/20/2021 0323   HGB 15.8 05/14/2021 1246   HGB 13.3 09/12/2014 1512   HCT 49.8 (H) 05/20/2021 0323   HCT 48.9 (H) 05/17/2021 0819   HCT 42.4 09/12/2014 1512   PLT 245 05/20/2021 0323   PLT 233 05/14/2021 1246   MCV 78.8 (L) 05/20/2021 0323   MCV 77 (L) 05/14/2021 1246   MCV 64.6 (L) 09/12/2014 1512   MCH 24.7 (L) 05/20/2021 0323   MCHC 31.3 05/20/2021 0323   RDW 18.1 (H) 05/20/2021 0323   RDW 17.1 (H) 05/14/2021 1246   RDW 21.6 (H) 09/12/2014 1512   LYMPHSABS 1.1 05/16/2021 1100   LYMPHSABS 1.2 05/14/2021 1246   LYMPHSABS 2.4 09/12/2014 1512   MONOABS 0.9 05/16/2021 1100   MONOABS 0.6 09/12/2014 1512   EOSABS 0.0 05/16/2021 1100   EOSABS 0.3 05/14/2021 1246   BASOSABS 0.1 05/16/2021 1100   BASOSABS 0.0 05/14/2021 1246   BASOSABS 0.1 09/12/2014 1512    BMET    Component Value Date/Time   NA 135 04/15/2022 1548   NA 143 05/14/2021 1246   NA 137 04/07/2013 1508   K 3.6 04/15/2022 1548   K 3.8 04/07/2013 1508   CL 91 (L) 04/15/2022 1548   CO2 31 04/15/2022 1548   CO2 23 04/07/2013 1508   GLUCOSE 138 (H) 04/15/2022 1548   GLUCOSE 96 04/07/2013 1508   BUN 25 (H) 04/15/2022 1548   BUN 15 05/14/2021 1246   BUN 11.5 04/07/2013 1508   CREATININE 1.70 (H) 04/15/2022 1548   CREATININE 0.8 04/07/2013 1508   CALCIUM 9.3 04/15/2022 1548   CALCIUM 9.2  04/07/2013 1508   GFRNONAA 36 (L) 04/15/2022 1548   GFRAA 100 01/17/2020 1530    BNP    Component Value Date/Time   BNP 5.8 04/15/2022 1548    ProBNP    Component Value Date/Time   PROBNP 12 10/18/2019 1600   PROBNP 35.0 03/24/2008 1302    Imaging: No results found.   Assessment & Plan:   Chronic pain of both knees - DG Knee Complete 4 Views Right - DG Knee Complete 4 Views Left - Ambulatory referral to Orthopedics - ketorolac (TORADOL) 30 MG/ML injection 30 mg   Follow up:  Follow up in 3 months     Ivonne Andrew, NP 11/08/2022

## 2022-11-08 NOTE — Assessment & Plan Note (Signed)
-   DG Knee Complete 4 Views Right - DG Knee Complete 4 Views Left - Ambulatory referral to Orthopedics - ketorolac (TORADOL) 30 MG/ML injection 30 mg   Follow up:  Follow up in 3 months

## 2022-11-08 NOTE — Patient Instructions (Addendum)
1. Chronic pain of both knees  - DG Knee Complete 4 Views Right - DG Knee Complete 4 Views Left - Ambulatory referral to Orthopedics - ketorolac (TORADOL) 30 MG/ML injection 30 mg   Follow up:  Follow up in 3 months

## 2022-11-14 ENCOUNTER — Ambulatory Visit (HOSPITAL_COMMUNITY)
Admission: RE | Admit: 2022-11-14 | Discharge: 2022-11-14 | Disposition: A | Discharge status: Home / Self Care | Payer: Medicaid Other | Source: Ambulatory Visit | Attending: Nurse Practitioner | Admitting: Nurse Practitioner

## 2022-11-14 DIAGNOSIS — G8929 Other chronic pain: Secondary | ICD-10-CM | POA: Diagnosis present

## 2022-11-14 DIAGNOSIS — M25561 Pain in right knee: Secondary | ICD-10-CM | POA: Insufficient documentation

## 2022-11-14 DIAGNOSIS — M25562 Pain in left knee: Secondary | ICD-10-CM | POA: Diagnosis present

## 2022-11-20 ENCOUNTER — Other Ambulatory Visit: Payer: Self-pay | Admitting: Nurse Practitioner

## 2022-11-20 DIAGNOSIS — I10 Essential (primary) hypertension: Secondary | ICD-10-CM

## 2022-11-20 NOTE — Progress Notes (Signed)
Pt has seen in my chart. Gh 

## 2022-11-21 ENCOUNTER — Other Ambulatory Visit: Payer: Self-pay | Admitting: Nurse Practitioner

## 2022-11-21 ENCOUNTER — Encounter: Payer: Self-pay | Admitting: Physician Assistant

## 2022-11-21 ENCOUNTER — Ambulatory Visit: Payer: Medicaid Other | Admitting: Physician Assistant

## 2022-11-21 DIAGNOSIS — M25561 Pain in right knee: Secondary | ICD-10-CM | POA: Diagnosis not present

## 2022-11-21 DIAGNOSIS — M25562 Pain in left knee: Secondary | ICD-10-CM | POA: Diagnosis not present

## 2022-11-21 DIAGNOSIS — M5126 Other intervertebral disc displacement, lumbar region: Secondary | ICD-10-CM

## 2022-11-21 DIAGNOSIS — M79604 Pain in right leg: Secondary | ICD-10-CM

## 2022-11-21 NOTE — Progress Notes (Signed)
Office Visit Note   Patient: Cynthia Becker           Date of Birth: [birth year]           MRN: [id number] Visit Date: 11/21/2022              Requested by: Ivonne Andrew, NP [phone] [address] [place],  [place] [address] PCP: Ivonne Andrew, NP  Chief Complaint  Patient presents with   Right Knee - Pain   Left Knee - Pain      HPI: Patient is a pleasant 52 year old woman who is accompanied by her husband today.  She has a 3-year history of bilateral knee pain right greater than left.  She thinks this has been treated in the past though not recently with injections into her knee which seem to help.  She has had a flareup of her knees making it difficult for her even to walk.  She was given a Toradol injection IM by her primary care provider a few days ago and it did help short-term.  She is a diabetic with most recent A1c of 7.9.  She is taking Ozempic and has lost 30 pounds since February  Assessment & Plan: Visit Diagnoses: Osteoarthritis bilateral knees  Plan: I had a long discussion with the patient and her husband.  She understands she has fairly advanced arthritis of both of her knees.  I would recommend steroid injections into her knees the right first.  She may not be able to have them done at the same time and I told her this because of her diabetes.  I would refer her to Dr. Shon Baton to do this under ultrasound guidance.  I taught her some close chain quadricep strengthening exercises she could do while she is sitting down.  Topical Voltaren gel.  She may follow-up with me as needed she understands that ultimately she needs to be under a BMI of 40 to be eligible for joint replacement I am hopeful that even losing weight will help her greatly reduce her pain  Follow-Up Instructions: Return Dr. Shon Baton for knee injections.   Ortho Exam  Patient is alert, oriented, no adenopathy, well-dressed, normal affect, normal respiratory effort. Examination bilateral knees no  effusion no erythema she does have some chronic venous stasis changes in her skin of her lower extremities but she has good flexion and extension compartments are compressible without any pain she is neurovascular intact she is sitting comfortably in a wheelchair and ambulates little because of the pain in her legs  Imaging: No results found. No images are attached to the encounter.  Labs: Lab Results  Component Value Date   HGBA1C 7.9 (A) 05/20/2022   HGBA1C 6.9 (A) 08/31/2021   HGBA1C 6.9 08/31/2021   HGBA1C 6.9 (A) 08/31/2021   HGBA1C 6.9 08/31/2021   ESRSEDRATE 23 04/19/2020   ESRSEDRATE 91 (H) 01/17/2020   ESRSEDRATE 8 01/20/2009   LABURIC 12.3 (H) 05/18/2021   LABURIC 8.0 (H) 04/19/2020   LABURIC 4.6 01/20/2009   REPTSTATUS 04/25/2020 FINAL 04/25/2020   REPTSTATUS 04/27/2020 FINAL 04/25/2020   GRAMSTAIN  04/25/2020    RARE WBC PRESENT,BOTH PMN AND MONONUCLEAR FEW SQUAMOUS EPITHELIAL CELLS PRESENT FEW GRAM POSITIVE RODS RARE GRAM POSITIVE COCCI IN PAIRS    CULT  04/25/2020    RARE Consistent with normal respiratory flora. No Pseudomonas species isolated Performed at [facility], [address], [place], [place] [address]      Lab Results  Component Value Date   ALBUMIN 3.2 (L) 05/20/2021   ALBUMIN 2.9 (L) 05/19/2021   ALBUMIN 3.0 (L) 05/18/2021    Lab Results  Component Value Date   MG 1.8 01/16/2022   MG 1.8 01/11/2022   MG 1.9 10/07/2020   No results found for: "VD25OH"  No results found for: "PREALBUMIN"    Latest Ref Rng & Units 05/20/2021    3:23 AM 05/19/2021    3:08 AM 05/18/2021    4:25 AM  CBC EXTENDED  WBC 4.0 - 10.5 K/uL 6.7  5.8  9.8   RBC 3.87 - 5.11 MIL/uL 6.32  6.18  6.88   Hemoglobin 12.0 - 15.0 g/dL 40.9  81.1  91.4   HCT 36.0 - 46.0 % 49.8  48.9  55.5   Platelets 150 - 400 K/uL 245  234  PLATELET CLUMPS NOTED ON SMEAR, COUNT APPEARS DECREASED      There is no height or weight on file to calculate BMI.  Orders:  No  orders of the defined types were placed in this encounter.  No orders of the defined types were placed in this encounter.    Procedures: No procedures performed  Clinical Data: No additional findings.  ROS:  All other systems negative, except as noted in the HPI. Review of Systems  Objective: Vital Signs: LMP 02/13/2014   Specialty Comments:  No specialty comments available.  PMFS History: Patient Active Problem List   Diagnosis Date Noted   Chronic pain of both knees 11/08/2022   Osteoarthritis of both knees    Influenza A 05/17/2021   Flu 05/16/2021   Acute on chronic respiratory failure with hypoxia and hypercapnia (HCC) 04/17/2021   Abnormal CT of brain 04/17/2021   Polycythemia 04/17/2021   Chronic back pain 04/17/2021   Leukocytosis 04/17/2021   Episode of unresponsiveness 04/17/2021   Acute respiratory failure with hypoxia (HCC) 10/02/2020   Chronic diastolic CHF (congestive heart failure) (HCC) 04/29/2020   Essential hypertension 04/25/2020   Community acquired bacterial pneumonia 04/25/2020   Class 3 obesity (HCC) 04/25/2020   Diabetes mellitus due to underlying condition with diabetic autonomic neuropathy, unspecified whether long term insulin use (HCC) 04/24/2020   Acute respiratory failure (HCC) 04/24/2020   Asthmatic bronchitis 03/21/2020   Nocturnal hypoxia 03/21/2020   OSA (obstructive sleep apnea) 12/22/2019   Shortness of breath 08/09/2019   No-show for appointment 06/30/2019   Morbid obesity with BMI of 60.0-69.9, adult (HCC) 09/14/2014   Iron deficiency anemia due to chronic blood loss 09/14/2014   CN (constipation) 09/14/2014   Postoperative state 04/12/2014   Uterine fibroid 03/17/2014   DUB (dysfunctional uterine bleeding) 01/27/2014   Iron deficiency anemia due to chronic blood loss 04/09/2013   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   Menometrorrhagia 04/09/2013   Left hand pain 09/18/2011   High blood pressure    ANEMIA, IRON DEFICIENCY  08/06/2010   CHOLECYSTECTOMY, HX OF 04/09/2010   Dyslipidemia 02/13/2010   DEGENERATIVE DISC DISEASE, LUMBOSACRAL SPINE W/RADICULOPATHY 11/01/2009   BACK PAIN, LUMBAR, WITH RADICULOPATHY 10/30/2009   TRIGGER FINGER 02/03/2009   Carpal tunnel syndrome 12/21/2008   Headache(784.0) 11/09/2008   Depression 10/04/2008   URI 08/24/2008   INSOMNIA 08/24/2008   Asthma 06/06/2008   Morbid obesity (HCC) 05/24/2008   ESSENTIAL HYPERTENSION 05/24/2008   Past Medical History:  Diagnosis Date   Anemia    iv iron treatment dec 2014/ see oncology for this last in dec 2014   Anxiety    Arthritis  knee, hands   Asthma    seasonal related/ albuterol used when uri   CHF (congestive heart failure) (HCC)    CN (constipation) 09/14/2014   DEGENERATIVE DISC DISEASE, LUMBOSACRAL SPINE W/RADICULOPATHY 11/01/2009   Qualifier: Diagnosis of  By: Huntley Dec, Scott     Depression    Diabetes (HCC)    H/O dizziness    Hx   Headache(784.0)    otc med prn   High blood pressure    HYPERLIPIDEMIA 02/13/2010   Qualifier: Diagnosis of  By: Huntley Dec, [place]     INSOMNIA 08/24/2008   Qualifier: Diagnosis of  By: Barbaraann Barthel MD, [place]     Morbid obesity (HCC) 05/24/2008   Qualifier: Diagnosis of  By: Barbaraann Barthel MD, [place]     SVD (spontaneous vaginal delivery)    x 3    Family History  Problem Relation Age of Onset   Diabetes Mother    Hypertension Father    Heart disease Other    Diabetes Other    Hypertension Other    Alcohol abuse Other    Hypertension Brother    Asthma Brother    Hypertension Brother    Asthma Brother    COPD Maternal Grandmother    Lung cancer Maternal Grandmother    Asthma Paternal Grandmother    Asthma Paternal Aunt     Past Surgical History:  Procedure Laterality Date   ABDOMINAL HYSTERECTOMY  03/13/2014   total    BILATERAL SALPINGECTOMY Bilateral 04/12/2014   Procedure: BILATERAL SALPINGECTOMY;  Surgeon: Adam Phenix, MD;  Location: [facility];  Service: Gynecology;   Laterality: Bilateral;   CARPAL TUNNEL RELEASE  08/30/2011   Procedure: CARPAL TUNNEL RELEASE;  Surgeon: Nicki Reaper, MD;  Location: [facility];  Service: Orthopedics;  Laterality: Right;   CHOLECYSTECTOMY  10/11   lap choli   GALLBLADDER SURGERY  04/04/2010   HYSTEROSCOPY N/A 02/08/2014   Procedure: HYSTEROSCOPY;  Surgeon: Adam Phenix, MD;  Location: [facility];  Service: Gynecology;  Laterality: N/A;   HYSTEROSCOPY WITH NOVASURE N/A 02/08/2014   Procedure: ATTEMPTED NOVASURE ABLATION;  Surgeon: Adam Phenix, MD;  Location: [facility];  Service: Gynecology;  Laterality: N/A;   INTRAUTERINE DEVICE INSERTION     07/19/2013   SEPTOPLASTY N/A 09/08/2013   Procedure: SEPTOPLASTY;  Surgeon: Melvenia Beam, MD;  Location: [facility];  Service: ENT;  Laterality: N/A;   SPINE SURGERY     TONSILLECTOMY     TONSILLECTOMY AND ADENOIDECTOMY Bilateral 09/08/2013   Procedure: TONSILLECTOMY ;  Surgeon: Melvenia Beam, MD;  Location: [facility] OR;  Service: ENT;  Laterality: Bilateral;   TUBAL LIGATION     VAGINAL HYSTERECTOMY N/A 04/12/2014   Procedure: LAPAROSCOPIC ASSISTED VAGINAL HYSTERECTOMY;  Surgeon: Adam Phenix, MD;  Location: [facility];  Service: Gynecology;  Laterality: N/A;   WISDOM TOOTH EXTRACTION     Social History   Occupational History   Not on file  Tobacco Use   Smoking status: Former    Packs/day: .25    Types: Cigarettes    Quit date: 04/24/2020    Years since quitting: 2.5   Smokeless tobacco: Never   Tobacco comments:    4-5 cigarettes a day  Vaping Use   Vaping Use: Never used  Substance and Sexual Activity   Alcohol use: No   Drug use: No   Sexual activity: Not on file

## 2022-12-03 ENCOUNTER — Ambulatory Visit: Payer: Medicaid Other | Admitting: Sports Medicine

## 2022-12-06 ENCOUNTER — Ambulatory Visit (INDEPENDENT_AMBULATORY_CARE_PROVIDER_SITE_OTHER): Payer: Medicaid Other | Admitting: Sports Medicine

## 2022-12-06 ENCOUNTER — Other Ambulatory Visit: Payer: Self-pay

## 2022-12-06 ENCOUNTER — Encounter: Payer: Self-pay | Admitting: Sports Medicine

## 2022-12-06 DIAGNOSIS — M25561 Pain in right knee: Secondary | ICD-10-CM | POA: Diagnosis not present

## 2022-12-06 DIAGNOSIS — G8929 Other chronic pain: Secondary | ICD-10-CM | POA: Diagnosis not present

## 2022-12-06 DIAGNOSIS — M1711 Unilateral primary osteoarthritis, right knee: Secondary | ICD-10-CM | POA: Diagnosis not present

## 2022-12-06 MED ORDER — LIDOCAINE HCL 1 % IJ SOLN
2.0000 mL | INTRAMUSCULAR | Status: AC | PRN
  Administered 2022-12-06: 2 mL

## 2022-12-06 MED ORDER — BUPIVACAINE HCL 0.25 % IJ SOLN
2.0000 mL | INTRAMUSCULAR | Status: AC | PRN
  Administered 2022-12-06: 2 mL via INTRA_ARTICULAR

## 2022-12-06 MED ORDER — METHYLPREDNISOLONE ACETATE 40 MG/ML IJ SUSP
40.0000 mg | INTRAMUSCULAR | Status: AC | PRN
  Administered 2022-12-06: 40 mg via INTRA_ARTICULAR

## 2022-12-06 NOTE — Progress Notes (Signed)
   Procedure Note  Patient: Cynthia Becker             Date of Birth: 30-Nov-1970           MRN: [id number]             Visit Date: 12/06/2022  Procedures: Visit Diagnoses:  1. Unilateral primary osteoarthritis, right knee   2. Chronic pain of right knee    Large Joint Inj: R knee on 12/06/2022 3:47 PM Indications: pain Details: 22 G 1.5 in and 3.5 in needle, ultrasound-guided superolateral approach Medications: 2 mL lidocaine 1 %; 2 mL bupivacaine 0.25 %; 40 mg methylPREDNISolone acetate 40 MG/ML Outcome: tolerated well, no immediate complications  Procedure: Ultrasound-guided Knee Injection, Right After discussion on risk/benefits/indication, informed verbal consent was obtained. A timeout was then performed. The patient was lying in supine on examination table with a small bolster underneath the affected knee for comfort. The patient's knee was prepped with Betadine and alcohol swabs. Utilizing ultrasound-guidance with the probe in a transverse position, the patient's suprapatellar bursa was identified and the knee joint was subsequently injected intraarticularly with a mixture of 2:2:1 lidocaine:depomedrol utilizing an in-plane visualization approach. Patient tolerated the procedure well without immediate complications.  Procedure, treatment alternatives, risks and benefits explained, specific risks discussed. Consent was given by the patient. Immediately prior to procedure a time out was called to verify the correct patient, procedure, equipment, support staff and site/side marked as required. Patient was prepped and draped in the usual sterile fashion.     - will see her response to this over the next 7-10 days. If beneficial, may consider repeating injection in the contralateral knee. Would wait at least 10-14 days   Madelyn Brunner, DO Primary Care Sports Medicine Physician  [facility] - Orthopedics  This note was dictated using Dragon naturally speaking software and may  contain errors in syntax, spelling, or content which have not been identified prior to signing this note.

## 2022-12-17 ENCOUNTER — Ambulatory Visit: Payer: Medicaid Other | Admitting: Sports Medicine

## 2022-12-17 ENCOUNTER — Other Ambulatory Visit: Payer: Self-pay

## 2022-12-17 ENCOUNTER — Encounter: Payer: Self-pay | Admitting: Sports Medicine

## 2022-12-17 DIAGNOSIS — G8929 Other chronic pain: Secondary | ICD-10-CM

## 2022-12-17 DIAGNOSIS — M25562 Pain in left knee: Secondary | ICD-10-CM

## 2022-12-17 DIAGNOSIS — M170 Bilateral primary osteoarthritis of knee: Secondary | ICD-10-CM

## 2022-12-17 MED ORDER — BUPIVACAINE HCL 0.25 % IJ SOLN
2.0000 mL | INTRAMUSCULAR | Status: AC | PRN
  Administered 2022-12-17: 2 mL via INTRA_ARTICULAR

## 2022-12-17 MED ORDER — LIDOCAINE HCL 1 % IJ SOLN
2.0000 mL | INTRAMUSCULAR | Status: AC | PRN
  Administered 2022-12-17: 2 mL

## 2022-12-17 MED ORDER — METHYLPREDNISOLONE ACETATE 40 MG/ML IJ SUSP
40.0000 mg | INTRAMUSCULAR | Status: AC | PRN
  Administered 2022-12-17: 40 mg via INTRA_ARTICULAR

## 2022-12-17 NOTE — Progress Notes (Signed)
Cynthia Becker - 52 y.o. female MRN [id number]  Date of birth: [birth year]  Office Visit Note: Visit Date: 12/17/2022 PCP: Ivonne Andrew, NP Referred by: Ivonne Andrew, NP  Subjective: No chief complaint on file.  HPI: Cynthia Becker is a pleasant 52 y.o. female who presents today for follow-up of bilateral knee osteoarthritis.  On 12/06/22 proceeded with US-guided R-knee intra-articular injection. She states her right knee now feels great, reports the injection helped immensely.  Left knee is continuing to bother her as well, asking if she can have this knee injected today.  She is continuing to work on healthy weight loss, currently on Ozempic.  Has lost at least 30 pounds since February. Since this time has continued, but slow. Hoping for gastric bypass surgery.  Medicine/Treatment: Ibuprofen 800 mg 3 times daily, chronic Percocet 10-325 mg every 4 hours as needed.   Pertinent ROS were reviewed with the patient and found to be negative unless otherwise specified above in HPI.   Assessment & Plan: Visit Diagnoses:  1. Chronic pain of left knee   2. Bilateral primary osteoarthritis of knee   3. Morbid obesity (HCC)    Plan: Both Michaline and I were pleased with the significant pain relief from her ultrasound-guided right intra-articular knee injection.  Given the amount of relief, she would like to proceed with corticosteroid into the left knee today, this was performed under ultrasound guidance.  She will continue to monitor her sugars for transient elevation.  She will continue her chronic pain medication of Percocet and ibuprofen and follow-up with pain management specialist.  Continue to work on healthy weight loss, discussed with her we can always consider repeating injections every 3 months or so as needed.  She did have a question regarding a knee procedure by her pain medicine specialist, she will send me a MyChart message what this would entail and I can do some research on  this for her.  Follow-up: Return if symptoms worsen or fail to improve, for as needed for knees; can always repeat injection about every 3 months .   Meds & Orders: No orders of the defined types were placed in this encounter.   Orders Placed This Encounter  Procedures   Large Joint Inj   US Guided Needle Placement - No Linked Charges     Procedures: Large Joint Inj: L knee on 12/17/2022 4:17 PM Indications: pain Details: 22 G 3.5 in needle, ultrasound-guided superolateral approach Medications: 2 mL lidocaine 1 %; 2 mL bupivacaine 0.25 %; 40 mg methylPREDNISolone acetate 40 MG/ML Outcome: tolerated well, no immediate complications  Procedure: Ultrasound-guided Knee Injection, Left After discussion on risk/benefits/indication, informed verbal consent was obtained. A timeout was then performed. The patient was lying in supine on examination table with a small bolster underneath the affected knee for comfort. The patient's knee was prepped with Betadine and alcohol swabs. Utilizing ultrasound-guidance with the probe in a transverse position, the patient's suprapatellar bursa was identified and the knee joint was subsequently injected intraarticularly with a mixture of 2:2:1 lidocaine:bupivicaine:depomedrol utilizing an in-plane visualization approach. Patient tolerated the procedure well without immediate complications.  Procedure, treatment alternatives, risks and benefits explained, specific risks discussed. Consent was given by the patient. Immediately prior to procedure a time out was called to verify the correct patient, procedure, equipment, support staff and site/side marked as required. Patient was prepped and draped in the usual sterile fashion.          Clinical History: No specialty comments  available.  She reports that she quit smoking about 2 years ago. Her smoking use included cigarettes. She smoked an average of .25 packs per day. She has never used smokeless tobacco.   Recent Labs    05/20/22 1550  HGBA1C 7.9*    Objective:   Vital Signs: LMP 02/13/2014   Physical Exam  Gen: Well-appearing, in no acute distress; non-toxic CV:  Well-perfused. Warm.  Resp: Breathing unlabored on room air; no wheezing. Psych: Fluid speech in conversation; appropriate affect; normal thought process Neuro: Sensation intact throughout. No gross coordination deficits.   Ortho Exam - Bilateral knees: There is medial greater than lateral joint line TTP.  Range of motion 0-120 degrees.  No swelling or gross effusion noted.  Imaging:  DG Knee Complete 4 Views Left CLINICAL DATA:  bilateral knee pain  EXAM: Bilateral knees - four views  COMPARISON:  05/17/2021  FINDINGS: No evidence of fracture, dislocation, or joint effusion. There is bilateral tricompartmental joint space narrowing, sclerosis and osteophytes consistent with degenerative joint disease. No evidence of effusion.  IMPRESSION: Bilateral degenerative changes.  Electronically Signed   By: Layla Maw M.D.   On: 11/15/2022 21:19 DG Knee Complete 4 Views Right CLINICAL DATA:  bilateral knee pain  EXAM: Bilateral knees - four views  COMPARISON:  05/17/2021  FINDINGS: No evidence of fracture, dislocation, or joint effusion. There is bilateral tricompartmental joint space narrowing, sclerosis and osteophytes consistent with degenerative joint disease. No evidence of effusion.  IMPRESSION: Bilateral degenerative changes.  Electronically Signed   By: Layla Maw M.D.   On: 11/15/2022 21:19    Past Medical/Family/Surgical/Social History: Medications & Allergies reviewed per EMR, new medications updated. Patient Active Problem List   Diagnosis Date Noted   Chronic pain of both knees 11/08/2022   Osteoarthritis of both knees    Influenza A 05/17/2021   Flu 05/16/2021   Acute on chronic respiratory failure with hypoxia and hypercapnia (HCC) 04/17/2021   Abnormal CT of brain  04/17/2021   Polycythemia 04/17/2021   Chronic back pain 04/17/2021   Leukocytosis 04/17/2021   Episode of unresponsiveness 04/17/2021   Acute respiratory failure with hypoxia (HCC) 10/02/2020   Chronic diastolic CHF (congestive heart failure) (HCC) 04/29/2020   Essential hypertension 04/25/2020   Community acquired bacterial pneumonia 04/25/2020   Class 3 obesity (HCC) 04/25/2020   Diabetes mellitus due to underlying condition with diabetic autonomic neuropathy, unspecified whether long term insulin use (HCC) 04/24/2020   Acute respiratory failure (HCC) 04/24/2020   Asthmatic bronchitis 03/21/2020   Nocturnal hypoxia 03/21/2020   OSA (obstructive sleep apnea) 12/22/2019   Shortness of breath 08/09/2019   No-show for appointment 06/30/2019   Morbid obesity with BMI of 60.0-69.9, adult (HCC) 09/14/2014   Iron deficiency anemia due to chronic blood loss 09/14/2014   CN (constipation) 09/14/2014   Postoperative state 04/12/2014   Uterine fibroid 03/17/2014   DUB (dysfunctional uterine bleeding) 01/27/2014   Iron deficiency anemia due to chronic blood loss 04/09/2013   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   Menometrorrhagia 04/09/2013   Left hand pain 09/18/2011   High blood pressure    ANEMIA, IRON DEFICIENCY 08/06/2010   CHOLECYSTECTOMY, HX OF 04/09/2010   Dyslipidemia 02/13/2010   DEGENERATIVE DISC DISEASE, LUMBOSACRAL SPINE W/RADICULOPATHY 11/01/2009   BACK PAIN, LUMBAR, WITH RADICULOPATHY 10/30/2009   TRIGGER FINGER 02/03/2009   Carpal tunnel syndrome 12/21/2008   Headache(784.0) 11/09/2008   Depression 10/04/2008   URI 08/24/2008   INSOMNIA 08/24/2008   Asthma 06/06/2008  Morbid obesity (HCC) 05/24/2008   ESSENTIAL HYPERTENSION 05/24/2008   Past Medical History:  Diagnosis Date   Anemia    iv iron treatment dec 2014/ see oncology for this last in dec 2014   Anxiety    Arthritis    knee, hands   Asthma    seasonal related/ albuterol used when uri   CHF (congestive  heart failure) (HCC)    CN (constipation) 09/14/2014   DEGENERATIVE DISC DISEASE, LUMBOSACRAL SPINE W/RADICULOPATHY 11/01/2009   Qualifier: Diagnosis of  By: Huntley Dec, Scott     Depression    Diabetes (HCC)    H/O dizziness    Hx   Headache(784.0)    otc med prn   High blood pressure    HYPERLIPIDEMIA 02/13/2010   Qualifier: Diagnosis of  By: Huntley Dec, Scott     INSOMNIA 08/24/2008   Qualifier: Diagnosis of  By: Barbaraann Barthel MD, [place]     Morbid obesity (HCC) 05/24/2008   Qualifier: Diagnosis of  By: Barbaraann Barthel MD, [place]     SVD (spontaneous vaginal delivery)    x 3   Family History  Problem Relation Age of Onset   Diabetes Mother    Hypertension Father    Heart disease Other    Diabetes Other    Hypertension Other    Alcohol abuse Other    Hypertension Brother    Asthma Brother    Hypertension Brother    Asthma Brother    COPD Maternal Grandmother    Lung cancer Maternal Grandmother    Asthma Paternal Grandmother    Asthma Paternal Aunt    Past Surgical History:  Procedure Laterality Date   ABDOMINAL HYSTERECTOMY  03/13/2014   total    BILATERAL SALPINGECTOMY Bilateral 04/12/2014   Procedure: BILATERAL SALPINGECTOMY;  Surgeon: Adam Phenix, MD;  Location: [facility];  Service: Gynecology;  Laterality: Bilateral;   CARPAL TUNNEL RELEASE  08/30/2011   Procedure: CARPAL TUNNEL RELEASE;  Surgeon: Nicki Reaper, MD;  Location: [facility];  Service: Orthopedics;  Laterality: Right;   CHOLECYSTECTOMY  10/11   lap choli   GALLBLADDER SURGERY  04/04/2010   HYSTEROSCOPY N/A 02/08/2014   Procedure: HYSTEROSCOPY;  Surgeon: Adam Phenix, MD;  Location: [facility];  Service: Gynecology;  Laterality: N/A;   HYSTEROSCOPY WITH NOVASURE N/A 02/08/2014   Procedure: ATTEMPTED NOVASURE ABLATION;  Surgeon: Adam Phenix, MD;  Location: [facility];  Service: Gynecology;  Laterality: N/A;   INTRAUTERINE DEVICE INSERTION     07/19/2013   SEPTOPLASTY N/A 09/08/2013   Procedure:  SEPTOPLASTY;  Surgeon: Melvenia Beam, MD;  Location: [facility];  Service: ENT;  Laterality: N/A;   SPINE SURGERY     TONSILLECTOMY     TONSILLECTOMY AND ADENOIDECTOMY Bilateral 09/08/2013   Procedure: TONSILLECTOMY ;  Surgeon: Melvenia Beam, MD;  Location: [facility];  Service: ENT;  Laterality: Bilateral;   TUBAL LIGATION     VAGINAL HYSTERECTOMY N/A 04/12/2014   Procedure: LAPAROSCOPIC ASSISTED VAGINAL HYSTERECTOMY;  Surgeon: Adam Phenix, MD;  Location: [facility];  Service: Gynecology;  Laterality: N/A;   WISDOM TOOTH EXTRACTION     Social History   Occupational History   Not on file  Tobacco Use   Smoking status: Former    Packs/day: .25    Types: Cigarettes    Quit date: 04/24/2020    Years since quitting: 2.6   Smokeless tobacco: Never   Tobacco comments:    4-5 cigarettes a day  Vaping Use  Vaping Use: Never used  Substance and Sexual Activity   Alcohol use: No   Drug use: No   Sexual activity: Not on file

## 2022-12-17 NOTE — Progress Notes (Signed)
Right knee feels great Injection helped

## 2023-01-31 ENCOUNTER — Other Ambulatory Visit (HOSPITAL_COMMUNITY): Payer: Self-pay | Admitting: Internal Medicine

## 2023-01-31 ENCOUNTER — Other Ambulatory Visit: Payer: Self-pay | Admitting: Nurse Practitioner

## 2023-01-31 DIAGNOSIS — I10 Essential (primary) hypertension: Secondary | ICD-10-CM

## 2023-02-07 ENCOUNTER — Encounter: Payer: Self-pay | Admitting: Oncology

## 2023-02-14 ENCOUNTER — Ambulatory Visit (INDEPENDENT_AMBULATORY_CARE_PROVIDER_SITE_OTHER): Payer: MEDICAID | Admitting: Nurse Practitioner

## 2023-02-14 ENCOUNTER — Encounter: Payer: Self-pay | Admitting: Nurse Practitioner

## 2023-02-14 VITALS — BP 118/67 | HR 92 | Temp 98.6°F | Resp 16 | Ht 65.0 in | Wt >= 6400 oz

## 2023-02-14 DIAGNOSIS — I10 Essential (primary) hypertension: Secondary | ICD-10-CM | POA: Diagnosis not present

## 2023-02-14 DIAGNOSIS — M25562 Pain in left knee: Secondary | ICD-10-CM | POA: Diagnosis not present

## 2023-02-14 DIAGNOSIS — M25561 Pain in right knee: Secondary | ICD-10-CM

## 2023-02-14 DIAGNOSIS — E119 Type 2 diabetes mellitus without complications: Secondary | ICD-10-CM

## 2023-02-14 DIAGNOSIS — Z6841 Body Mass Index (BMI) 40.0 and over, adult: Secondary | ICD-10-CM

## 2023-02-14 DIAGNOSIS — M79604 Pain in right leg: Secondary | ICD-10-CM

## 2023-02-14 DIAGNOSIS — G8929 Other chronic pain: Secondary | ICD-10-CM | POA: Diagnosis not present

## 2023-02-14 DIAGNOSIS — M5126 Other intervertebral disc displacement, lumbar region: Secondary | ICD-10-CM

## 2023-02-14 MED ORDER — OZEMPIC (2 MG/DOSE) 8 MG/3ML ~~LOC~~ SOPN: 2.0000 mg | PEN_INJECTOR | SUBCUTANEOUS | 11 refills | Status: DC

## 2023-02-14 MED ORDER — GABAPENTIN 800 MG PO TABS: 800.0000 mg | ORAL_TABLET | Freq: Three times a day (TID) | ORAL | 0 refills | Status: DC

## 2023-02-14 MED ORDER — CARVEDILOL 3.125 MG PO TABS: 3.1250 mg | ORAL_TABLET | Freq: Two times a day (BID) | ORAL | 0 refills | Status: DC

## 2023-02-14 MED ORDER — OMEPRAZOLE 20 MG PO CPDR: 20.0000 mg | DELAYED_RELEASE_CAPSULE | Freq: Every day | ORAL | 0 refills | Status: DC

## 2023-02-14 MED ORDER — KETOROLAC TROMETHAMINE 60 MG/2ML IM SOLN
60.0000 mg | Freq: Once | INTRAMUSCULAR | Status: AC
  Administered 2023-02-14: 60 mg via INTRAMUSCULAR

## 2023-02-14 MED ORDER — SPIRONOLACTONE 25 MG PO TABS: 25.0000 mg | ORAL_TABLET | Freq: Every day | ORAL | 3 refills | Status: DC

## 2023-02-14 NOTE — Progress Notes (Unsigned)
@Patient  ID: Cynthia Becker, female    DOB: 1971-01-29, 52 y.o.   MRN: [id number]  Chief Complaint  Patient presents with   Follow-up    Referring provider: Ivonne Andrew, NP   HPI  Cynthia Becker is a 52 y.o. female referred by Orion Crook, NP for chronic diastolic CHF.  Has history of chronic diastolic CHF, HTN, morbid obesity, type II DM, HLD tobacco use, asthma, chronic respiratory failure with hypoxia on supplemental O2.   Patient presents today for a follow-up visit for hypertension and diabetes.  Overall she has been doing well.  We will refer her to medical weight management.  Her BMI is greater than 72.  She is interested in losing weight.  We will trial Ozempic.  We have samples in the office and we will start patient on this today.  The patient does complain of continuing low back pain and right leg pain.  We will refill Neurontin today.  Will give patient Toradol injection in office today.  Patient does need to follow-up with orthopedics.  Denies f/c/s, n/v/d, hemoptysis, PND, leg swelling Denies chest pain or edema     No Known Allergies  Immunization History  Administered Date(s) Administered   Influenza Whole 04/25/2010   Influenza,inj,Quad PF,6+ Mos 03/07/2014, 03/10/2019   Pneumococcal Polysaccharide-23 04/13/2014   Td 08/02/2010    Past Medical History:  Diagnosis Date   Anemia    iv iron treatment dec 2014/ see oncology for this last in dec 2014   Anxiety    Arthritis    knee, hands   Asthma    seasonal related/ albuterol used when uri   CHF (congestive heart failure) (HCC)    CN (constipation) 09/14/2014   DEGENERATIVE DISC DISEASE, LUMBOSACRAL SPINE W/RADICULOPATHY 11/01/2009   Qualifier: Diagnosis of  By: Huntley Dec, Scott     Depression    Diabetes (HCC)    H/O dizziness    Hx   Headache(784.0)    otc med prn   High blood pressure    HYPERLIPIDEMIA 02/13/2010   Qualifier: Diagnosis of  By: Huntley Dec, Scott     INSOMNIA 08/24/2008    Qualifier: Diagnosis of  By: Barbaraann Barthel MD, [place]     Morbid obesity (HCC) 05/24/2008   Qualifier: Diagnosis of  By: Barbaraann Barthel MD, [place]     SVD (spontaneous vaginal delivery)    x 3    Tobacco History: Social History   Tobacco Use  Smoking Status Former   Current packs/day: 0.00   Types: Cigarettes   Quit date: 04/24/2020   Years since quitting: 2.8  Smokeless Tobacco Never  Tobacco Comments   4-5 cigarettes a day   Counseling given: Not Answered Tobacco comments: 4-5 cigarettes a day   Outpatient Encounter Medications as of 02/14/2023  Medication Sig   ACCU-CHEK FASTCLIX LANCETS MISC USE TO CHECK BLOOD SUGAR TWICE DAILY   ACCU-CHEK GUIDE test strip USE 1 STRIP TO CHECK BLOOD SUGAR THREE TIMES DAILY   albuterol (PROVENTIL) (2.5 MG/3ML) 0.083% nebulizer solution USE 1 VIAL IN NEBULIZER EVERY 6 HOURS AS NEEDED FOR WHEEZING OR SHORTNESS OF BREATH   blood glucose meter kit and supplies KIT Dispense based on patient and insurance preference. Use up to four times daily as directed.   cyclobenzaprine (FLEXERIL) 5 MG tablet Take 1 tablet (5 mg total) by mouth 3 (three) times daily as needed. for muscle spams   ENTRESTO 49-51 MG Take 1 tablet by mouth twice daily   EQ ALLERGY RELIEF, CETIRIZINE,  10 MG tablet Take 1 tablet by mouth once daily   hydrOXYzine (ATARAX) 10 MG tablet TAKE 1 TABLET BY MOUTH THREE TIMES DAILY AS NEEDED FOR ITCHING   ibuprofen (ADVIL) 800 MG tablet Take 800 mg by mouth every 8 (eight) hours as needed for moderate pain.   LINZESS 290 MCG CAPS capsule TAKE 1 CAPSULE BY MOUTH ONCE DAILY BEFORE BREAKFAST   metolazone (ZAROXOLYN) 2.5 MG tablet Take 1 tablet (2.5 mg total) by mouth 2 (two) times a week. On Wednesday and Saturday   naloxone [facility]) nasal spray 4 mg/0.1 mL Place 1 spray into the nose as needed.   oxyCODONE-acetaminophen (PERCOCET) 10-325 MG tablet Take 1 tablet by mouth every 4 (four) hours as needed for pain.   potassium chloride SA (KLOR-CON M) 20 MEQ  tablet Take 1 tablet (20 mEq total) by mouth daily.   promethazine (PHENERGAN) 25 MG tablet Take 25 mg by mouth every 8 (eight) hours as needed for nausea or vomiting.   torsemide (DEMADEX) 20 MG tablet Take 3 tablets (60 mg total) by mouth 2 (two) times daily.   Turmeric (QC TUMERIC COMPLEX PO) Take 500 mg by mouth daily.   vitamin B-12 (CYANOCOBALAMIN) 1000 MCG tablet Take 1,000 mcg by mouth daily.   [DISCONTINUED] carvedilol (COREG) 3.125 MG tablet TAKE 1 TABLET BY MOUTH TWICE DAILY WITH A MEAL   [DISCONTINUED] gabapentin (NEURONTIN) 800 MG tablet TAKE 1 TABLET BY MOUTH THREE TIMES DAILY   [DISCONTINUED] omeprazole (PRILOSEC) 20 MG capsule Take 20 mg by mouth at bedtime.   [DISCONTINUED] Semaglutide, 2 MG/DOSE, (OZEMPIC, 2 MG/DOSE,) 8 MG/3ML SOPN Inject 2 mg into the skin once a week.   [DISCONTINUED] spironolactone (ALDACTONE) 25 MG tablet Take 1 tablet by mouth once daily   carvedilol (COREG) 3.125 MG tablet Take 1 tablet (3.125 mg total) by mouth 2 (two) times daily with a meal.   gabapentin (NEURONTIN) 800 MG tablet Take 1 tablet (800 mg total) by mouth 3 (three) times daily.   omeprazole (PRILOSEC) 20 MG capsule Take 1 capsule (20 mg total) by mouth at bedtime.   Semaglutide, 2 MG/DOSE, (OZEMPIC, 2 MG/DOSE,) 8 MG/3ML SOPN Inject 2 mg into the skin once a week.   spironolactone (ALDACTONE) 25 MG tablet Take 1 tablet (25 mg total) by mouth daily.   [EXPIRED] ketorolac (TORADOL) injection 60 mg    No facility-administered encounter medications on file as of 02/14/2023.     Review of Systems  Review of Systems  Constitutional: Negative.   HENT: Negative.    Cardiovascular: Negative.   Gastrointestinal: Negative.   Musculoskeletal:  Positive for arthralgias.  Allergic/Immunologic: Negative.   Neurological: Negative.   Psychiatric/Behavioral: Negative.         Physical Exam  BP 118/67 (BP Location: Right Wrist, Patient Position: Sitting, Cuff Size: Normal)   Pulse 92   Temp  98.6 F (37 C)   Resp 16   Ht 5\' 5"  (1.651 m)   Wt (!) 436 lb (197.8 kg)   LMP 02/13/2014   SpO2 97%   BMI 72.55 kg/m   Wt Readings from Last 5 Encounters:  02/14/23 (!) 436 lb (197.8 kg)  11/08/22 (!) 440 lb (199.6 kg)  04/15/22 (!) 465 lb 3.2 oz (211 kg)  01/16/22 (!) 463 lb 6.4 oz (210.2 kg)  01/11/22 (!) 465 lb (210.9 kg)     Physical Exam Vitals and nursing note reviewed.  Constitutional:      General: She is not in acute distress.  Appearance: She is well-developed.  Cardiovascular:     Rate and Rhythm: Normal rate and regular rhythm.  Pulmonary:     Effort: Pulmonary effort is normal.     Breath sounds: Normal breath sounds.  Neurological:     Mental Status: She is alert and oriented to person, place, and time.      Lab Results:  CBC    Component Value Date/Time   WBC 7.9 02/14/2023 1617   WBC 6.7 05/20/2021 0323   RBC 5.45 (H) 02/14/2023 1617   RBC 6.32 (H) 05/20/2021 0323   HGB 13.5 02/14/2023 1617   HGB 13.3 09/12/2014 1512   HCT 40.9 02/14/2023 1617   HCT 42.4 09/12/2014 1512   PLT 338 02/14/2023 1617   MCV 75 (L) 02/14/2023 1617   MCV 64.6 (L) 09/12/2014 1512   MCH 24.8 (L) 02/14/2023 1617   MCH 24.7 (L) 05/20/2021 0323   MCHC 33.0 02/14/2023 1617   MCHC 31.3 05/20/2021 0323   RDW 14.4 02/14/2023 1617   RDW 21.6 (H) 09/12/2014 1512   LYMPHSABS 1.1 05/16/2021 1100   LYMPHSABS 1.2 05/14/2021 1246   LYMPHSABS 2.4 09/12/2014 1512   MONOABS 0.9 05/16/2021 1100   MONOABS 0.6 09/12/2014 1512   EOSABS 0.0 05/16/2021 1100   EOSABS 0.3 05/14/2021 1246   BASOSABS 0.1 05/16/2021 1100   BASOSABS 0.0 05/14/2021 1246   BASOSABS 0.1 09/12/2014 1512    BMET    Component Value Date/Time   NA 139 02/14/2023 1617   NA 137 04/07/2013 1508   K 4.0 02/14/2023 1617   K 3.8 04/07/2013 1508   CL 97 02/14/2023 1617   CO2 25 02/14/2023 1617   CO2 23 04/07/2013 1508   GLUCOSE 89 02/14/2023 1617   GLUCOSE 138 (H) 04/15/2022 1548   GLUCOSE 96 04/07/2013  1508   BUN 27 (H) 02/14/2023 1617   BUN 11.5 04/07/2013 1508   CREATININE 1.77 (H) 02/14/2023 1617   CREATININE 0.8 04/07/2013 1508   CALCIUM 9.0 02/14/2023 1617   CALCIUM 9.2 04/07/2013 1508   GFRNONAA 36 (L) 04/15/2022 1548   GFRAA 100 01/17/2020 1530    BNP    Component Value Date/Time   BNP 5.8 04/15/2022 1548    ProBNP    Component Value Date/Time   PROBNP 12 10/18/2019 1600   PROBNP 35.0 03/24/2008 1302      Assessment & Plan:   Class 3 severe obesity due to excess calories with body mass index (BMI) greater than or equal to 70 in adult (HCC) - Amb Ref to Medical Weight Management  2. Essential hypertension  - carvedilol (COREG) 3.125 MG tablet; Take 1 tablet (3.125 mg total) by mouth 2 (two) times daily with a meal.  Dispense: 60 tablet; Refill: 0  3. Type 2 diabetes mellitus without complication, unspecified whether long term insulin use (HCC)  - Hemoglobin A1c - CBC - Comprehensive metabolic panel  4. DEGENERATIVE DISC DISEASE, LUMBOSACRAL SPINE W/RADICULOPATHY  - gabapentin (NEURONTIN) 800 MG tablet; Take 1 tablet (800 mg total) by mouth 3 (three) times daily.  Dispense: 270 tablet; Refill: 0  5. Right leg pain  - gabapentin (NEURONTIN) 800 MG tablet; Take 1 tablet (800 mg total) by mouth 3 (three) times daily.  Dispense: 270 tablet; Refill: 0  6. Chronic pain of both knees  - ketorolac (TORADOL) injection 60 mg    Follow up:  Follow up in 3 months     Ivonne Andrew, NP 02/20/2023

## 2023-02-14 NOTE — Patient Instructions (Addendum)
1. Class 3 severe obesity due to excess calories with body mass index (BMI) greater than or equal to 70 in adult, unspecified whether serious comorbidity present (HCC)  - Amb Ref to Medical Weight Management  2. Essential hypertension  - carvedilol (COREG) 3.125 MG tablet; Take 1 tablet (3.125 mg total) by mouth 2 (two) times daily with a meal.  Dispense: 60 tablet; Refill: 0  3. Type 2 diabetes mellitus without complication, unspecified whether long term insulin use (HCC)  - Hemoglobin A1c - CBC - Comprehensive metabolic panel  4. DEGENERATIVE DISC DISEASE, LUMBOSACRAL SPINE W/RADICULOPATHY  - gabapentin (NEURONTIN) 800 MG tablet; Take 1 tablet (800 mg total) by mouth 3 (three) times daily.  Dispense: 270 tablet; Refill: 0  5. Right leg pain  - gabapentin (NEURONTIN) 800 MG tablet; Take 1 tablet (800 mg total) by mouth 3 (three) times daily.  Dispense: 270 tablet; Refill: 0  6. Chronic pain of both knees  - ketorolac (TORADOL) injection 60 mg    Follow up:  Follow up in 3 months

## 2023-02-15 LAB — COMPREHENSIVE METABOLIC PANEL
ALT: 19 IU/L (ref 0–32)
AST: 19 IU/L (ref 0–40)
Albumin: 3.8 g/dL (ref 3.8–4.9)
Alkaline Phosphatase: 84 IU/L (ref 44–121)
BUN/Creatinine Ratio: 15 (ref 9–23)
BUN: 27 mg/dL — ABNORMAL HIGH (ref 6–24)
Bilirubin Total: 0.2 mg/dL (ref 0.0–1.2)
CO2: 25 mmol/L (ref 20–29)
Calcium: 9 mg/dL (ref 8.7–10.2)
Chloride: 97 mmol/L (ref 96–106)
Creatinine, Ser: 1.77 mg/dL — ABNORMAL HIGH (ref 0.57–1.00)
Globulin, Total: 3.8 g/dL (ref 1.5–4.5)
Glucose: 89 mg/dL (ref 70–99)
Potassium: 4 mmol/L (ref 3.5–5.2)
Sodium: 139 mmol/L (ref 134–144)
Total Protein: 7.6 g/dL (ref 6.0–8.5)
eGFR: 34 mL/min/{1.73_m2} — ABNORMAL LOW (ref >59)

## 2023-02-15 LAB — CBC
Hematocrit: 40.9 % (ref 34.0–46.6)
Hemoglobin: 13.5 g/dL (ref 11.1–15.9)
MCH: 24.8 pg — ABNORMAL LOW (ref 26.6–33.0)
MCHC: 33 g/dL (ref 31.5–35.7)
MCV: 75 fL — ABNORMAL LOW (ref 79–97)
Platelets: 338 10*3/uL (ref 150–450)
RBC: 5.45 x10E6/uL — ABNORMAL HIGH (ref 3.77–5.28)
RDW: 14.4 % (ref 11.7–15.4)
WBC: 7.9 10*3/uL (ref 3.4–10.8)

## 2023-02-15 LAB — HEMOGLOBIN A1C
Est. average glucose Bld gHb Est-mCnc: 123 mg/dL
Hgb A1c MFr Bld: 5.9 % — ABNORMAL HIGH (ref 4.8–5.6)

## 2023-02-20 ENCOUNTER — Encounter: Payer: Self-pay | Admitting: Nurse Practitioner

## 2023-02-20 DIAGNOSIS — E66813 Obesity, class 3: Secondary | ICD-10-CM | POA: Insufficient documentation

## 2023-02-20 NOTE — Assessment & Plan Note (Signed)
-   Amb Ref to Medical Weight Management  2. Essential hypertension  - carvedilol (COREG) 3.125 MG tablet; Take 1 tablet (3.125 mg total) by mouth 2 (two) times daily with a meal.  Dispense: 60 tablet; Refill: 0  3. Type 2 diabetes mellitus without complication, unspecified whether long term insulin use (HCC)  - Hemoglobin A1c - CBC - Comprehensive metabolic panel  4. DEGENERATIVE DISC DISEASE, LUMBOSACRAL SPINE W/RADICULOPATHY  - gabapentin (NEURONTIN) 800 MG tablet; Take 1 tablet (800 mg total) by mouth 3 (three) times daily.  Dispense: 270 tablet; Refill: 0  5. Right leg pain  - gabapentin (NEURONTIN) 800 MG tablet; Take 1 tablet (800 mg total) by mouth 3 (three) times daily.  Dispense: 270 tablet; Refill: 0  6. Chronic pain of both knees  - ketorolac (TORADOL) injection 60 mg    Follow up:  Follow up in 3 months

## 2023-02-27 ENCOUNTER — Other Ambulatory Visit (HOSPITAL_COMMUNITY): Payer: Self-pay | Admitting: Internal Medicine

## 2023-03-10 ENCOUNTER — Ambulatory Visit: Payer: MEDICAID | Admitting: Sports Medicine

## 2023-03-14 ENCOUNTER — Telehealth: Payer: Self-pay | Admitting: Nurse Practitioner

## 2023-03-14 NOTE — Telephone Encounter (Signed)
Patient dropped off document FMLA, to be filled out by provider. Patient requested to send it back via Call Patient to pick up within 7-days. Document is located in providers tray at front office.Please advise at [facility] [phone]

## 2023-03-24 ENCOUNTER — Other Ambulatory Visit: Payer: Self-pay

## 2023-03-24 ENCOUNTER — Encounter: Payer: Self-pay | Admitting: Sports Medicine

## 2023-03-24 ENCOUNTER — Ambulatory Visit (INDEPENDENT_AMBULATORY_CARE_PROVIDER_SITE_OTHER): Payer: MEDICAID | Admitting: Sports Medicine

## 2023-03-24 ENCOUNTER — Telehealth: Payer: Self-pay | Admitting: Nurse Practitioner

## 2023-03-24 DIAGNOSIS — E0843 Diabetes mellitus due to underlying condition with diabetic autonomic (poly)neuropathy: Secondary | ICD-10-CM

## 2023-03-24 DIAGNOSIS — M25561 Pain in right knee: Secondary | ICD-10-CM

## 2023-03-24 DIAGNOSIS — G8929 Other chronic pain: Secondary | ICD-10-CM | POA: Diagnosis not present

## 2023-03-24 DIAGNOSIS — M170 Bilateral primary osteoarthritis of knee: Secondary | ICD-10-CM

## 2023-03-24 MED ORDER — BUPIVACAINE HCL 0.25 % IJ SOLN
2.0000 mL | INTRAMUSCULAR | Status: AC | PRN
Start: 2023-03-24 — End: 2023-03-24
  Administered 2023-03-24: 2 mL via INTRA_ARTICULAR

## 2023-03-24 MED ORDER — METHYLPREDNISOLONE ACETATE 40 MG/ML IJ SUSP
40.0000 mg | INTRAMUSCULAR | Status: AC | PRN
Start: 2023-03-24 — End: 2023-03-24
  Administered 2023-03-24: 40 mg via INTRA_ARTICULAR

## 2023-03-24 MED ORDER — LIDOCAINE HCL 1 % IJ SOLN
2.0000 mL | INTRAMUSCULAR | Status: AC | PRN
Start: 2023-03-24 — End: 2023-03-24
  Administered 2023-03-24: 2 mL

## 2023-03-24 NOTE — Telephone Encounter (Signed)
Pt looking for FMLA paperwork.

## 2023-03-24 NOTE — Telephone Encounter (Signed)
Pt was advised Cynthia Becker 

## 2023-03-24 NOTE — Progress Notes (Signed)
Cynthia Becker - 52 y.o. female MRN [id number]  Date of birth: 12/30/1970  Office Visit Note: Visit Date: 03/24/2023 PCP: Cynthia Andrew, NP Referred by: Cynthia Andrew, NP  Subjective: Chief Complaint  Patient presents with   Right Knee - Pain    Injection   HPI: Cynthia Becker is a pleasant 52 y.o. female who presents today for R > L knee pain with known osteoarthritis.  Both knees do bother her and she has known arthritis, but she is working on healthy weight loss.  She has lost about 30 pounds to 35 pounds, her weight will fluctuate however.  Had a previous ultrasound-guided right knee intra-articular injection about 3.5 months ago which gave her good relief for about 10-12 weeks.  She is interested in repeat injection today.  She is doing some home exercises but want some guidance with this.  Medicine/Treatment: Ibuprofen 800 mg 3 times daily as needed, chronic Percocet 10-325 mg every 4 hours as needed.  Last had an ultrasound-guided right knee intra-articular injection on 12/06/2022, this gave her at least 10 to 12 weeks of relief.  She has been improving her glucose control, she is managed on Ozempic 2 mg IM once weekly. Lab Results  Component Value Date   HGBA1C 5.9 (H) 02/14/2023   Pertinent ROS were reviewed with the patient and found to be negative unless otherwise specified above in HPI.   Assessment & Plan: Visit Diagnoses:  1. Chronic pain of right knee   2. Bilateral primary osteoarthritis of knee   3. Morbid obesity (HCC)   4. Diabetes mellitus due to underlying condition with diabetic autonomic neuropathy, unspecified whether long term insulin use (HCC)    Plan: [name] is dealing with an exacerbation of her chronic underlying right knee osteoarthritis.  She does have bilateral knee osteoarthritis but her right is more bothersome than her left.  Through shared decision-making, did proceed with ultrasound-guided right knee intra-articular injection, patient  tolerated well.  She may use ice as, over-the-counter Tylenol and/or ibuprofen for any postinjection pain.  For her chronic pain she may continue use of her chronic Percocet 10-325 mg every 4-6 hours as needed.  I did commend her on improving her A1c which is down almost 2 points.  She will continue on her Ozempic 2 mg once weekly.  We discussed healthy weight loss activity, did recommend stationary bicycle/use of paddles for home exercises for her knees.  Follow-up: 3 months for right knee; may see for left knee as needed   Meds & Orders: No orders of the defined types were placed in this encounter.   Orders Placed This Encounter  Procedures   Large Joint Inj   US Guided Needle Placement - No Linked Charges     Procedures: Large Joint Inj: R knee on 03/24/2023 4:50 PM Indications: pain Details: 22 G 1.5 in and 3.5 in needle, ultrasound-guided superolateral approach Medications: 2 mL lidocaine 1 %; 2 mL bupivacaine 0.25 %; 40 mg methylPREDNISolone acetate 40 MG/ML Aspirate: clear and yellow Outcome: tolerated well, no immediate complications  Procedure: Ultrasound-guided Knee Injection, Right After discussion on risk/benefits/indication, informed verbal consent was obtained. A timeout was then performed. The patient was lying in supine on examination table with a small bolster underneath the affected knee for comfort. The patient's knee was prepped with Betadine and alcohol swabs. Utilizing ultrasound-guidance with the probe in a transverse position, the patient's suprapatellar bursa was identified and the knee joint was subsequently injected intraarticularly with a mixture  of 2:2:1 lidocaine:bupivicaine:methylprednisolone utilizing an in-plane visualization approach. Patient tolerated the procedure well without immediate complications.   Procedure, treatment alternatives, risks and benefits explained, specific risks discussed. Consent was given by the patient. Immediately prior to procedure a  time out was called to verify the correct patient, procedure, equipment, support staff and site/side marked as required. Patient was prepped and draped in the usual sterile fashion.          Clinical History: No specialty comments available.  She reports that she quit smoking about 2 years ago. Her smoking use included cigarettes. She has never used smokeless tobacco.  Recent Labs    05/20/22 1550 02/14/23 1617  HGBA1C 7.9* 5.9*    Objective:   Vital Signs: LMP 02/13/2014   Physical Exam  Gen: Well-appearing, in no acute distress; non-toxic CV: Well-perfused. Warm.  Resp: Breathing unlabored on room air; no wheezing. Psych: Fluid speech in conversation; appropriate affect; normal thought process Neuro: Sensation intact throughout. No gross coordination deficits.   Ortho Exam - Right knee: There is some positive TTP over the medial joint line.  No significant effusion about the knee.  Range of motion from 0-125 degrees.  No varus or valgus instability.  Imaging: No results found.  Past Medical/Family/Surgical/Social History: Medications & Allergies reviewed per EMR, new medications updated. Patient Active Problem List   Diagnosis Date Noted   Class 3 severe obesity due to excess calories with body mass index (BMI) greater than or equal to 70 in adult [facility]) 02/20/2023   Chronic pain of both knees 11/08/2022   Osteoarthritis of both knees    Influenza A 05/17/2021   Flu 05/16/2021   Acute on chronic respiratory failure with hypoxia and hypercapnia (HCC) 04/17/2021   Abnormal CT of brain 04/17/2021   Polycythemia 04/17/2021   Chronic back pain 04/17/2021   Leukocytosis 04/17/2021   Episode of unresponsiveness 04/17/2021   Acute respiratory failure with hypoxia (HCC) 10/02/2020   Chronic diastolic CHF (congestive heart failure) (HCC) 04/29/2020   Essential hypertension 04/25/2020   Community acquired bacterial pneumonia 04/25/2020   Class 3 obesity (HCC) 04/25/2020    Diabetes mellitus due to underlying condition with diabetic autonomic neuropathy, unspecified whether long term insulin use (HCC) 04/24/2020   Acute respiratory failure (HCC) 04/24/2020   Asthmatic bronchitis 03/21/2020   Nocturnal hypoxia 03/21/2020   OSA (obstructive sleep apnea) 12/22/2019   Shortness of breath 08/09/2019   No-show for appointment 06/30/2019   Morbid obesity with BMI of 60.0-69.9, adult (HCC) 09/14/2014   Iron deficiency anemia due to chronic blood loss 09/14/2014   CN (constipation) 09/14/2014   Postoperative state 04/12/2014   Uterine fibroid 03/17/2014   DUB (dysfunctional uterine bleeding) 01/27/2014   Iron deficiency anemia due to chronic blood loss 04/09/2013   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   Menometrorrhagia 04/09/2013   Left hand pain 09/18/2011   High blood pressure    ANEMIA, IRON DEFICIENCY 08/06/2010   CHOLECYSTECTOMY, HX OF 04/09/2010   Dyslipidemia 02/13/2010   DEGENERATIVE DISC DISEASE, LUMBOSACRAL SPINE W/RADICULOPATHY 11/01/2009   BACK PAIN, LUMBAR, WITH RADICULOPATHY 10/30/2009   TRIGGER FINGER 02/03/2009   Carpal tunnel syndrome 12/21/2008   Headache(784.0) 11/09/2008   Depression 10/04/2008   URI 08/24/2008   INSOMNIA 08/24/2008   Asthma 06/06/2008   Morbid obesity (HCC) 05/24/2008   ESSENTIAL HYPERTENSION 05/24/2008   Past Medical History:  Diagnosis Date   Anemia    iv iron treatment dec 2014/ see oncology for this last in dec 2014  Anxiety    Arthritis    knee, hands   Asthma    seasonal related/ albuterol used when uri   CHF (congestive heart failure) (HCC)    CN (constipation) 09/14/2014   DEGENERATIVE DISC DISEASE, LUMBOSACRAL SPINE W/RADICULOPATHY 11/01/2009   Qualifier: Diagnosis of  By: Huntley Dec, Scott     Depression    Diabetes (HCC)    H/O dizziness    Hx   Headache(784.0)    otc med prn   High blood pressure    HYPERLIPIDEMIA 02/13/2010   Qualifier: Diagnosis of  By: Huntley Dec, Scott     INSOMNIA  08/24/2008   Qualifier: Diagnosis of  By: Barbaraann Barthel MD, [place]     Morbid obesity (HCC) 05/24/2008   Qualifier: Diagnosis of  By: Barbaraann Barthel MD, [place]     SVD (spontaneous vaginal delivery)    x 3   Family History  Problem Relation Age of Onset   Diabetes Mother    Hypertension Father    Heart disease Other    Diabetes Other    Hypertension Other    Alcohol abuse Other    Hypertension Brother    Asthma Brother    Hypertension Brother    Asthma Brother    COPD Maternal Grandmother    Lung cancer Maternal Grandmother    Asthma Paternal Grandmother    Asthma Paternal Aunt    Past Surgical History:  Procedure Laterality Date   ABDOMINAL HYSTERECTOMY  03/13/2014   total    BILATERAL SALPINGECTOMY Bilateral 04/12/2014   Procedure: BILATERAL SALPINGECTOMY;  Surgeon: Adam Phenix, MD;  Location: [facility];  Service: Gynecology;  Laterality: Bilateral;   CARPAL TUNNEL RELEASE  08/30/2011   Procedure: CARPAL TUNNEL RELEASE;  Surgeon: Nicki Reaper, MD;  Location: [facility];  Service: Orthopedics;  Laterality: Right;   CHOLECYSTECTOMY  10/11   lap choli   GALLBLADDER SURGERY  04/04/2010   HYSTEROSCOPY N/A 02/08/2014   Procedure: HYSTEROSCOPY;  Surgeon: Adam Phenix, MD;  Location: [facility];  Service: Gynecology;  Laterality: N/A;   HYSTEROSCOPY WITH NOVASURE N/A 02/08/2014   Procedure: ATTEMPTED NOVASURE ABLATION;  Surgeon: Adam Phenix, MD;  Location: [facility];  Service: Gynecology;  Laterality: N/A;   INTRAUTERINE DEVICE INSERTION     07/19/2013   SEPTOPLASTY N/A 09/08/2013   Procedure: SEPTOPLASTY;  Surgeon: Melvenia Beam, MD;  Location[facility] OR;  Service: ENT;  Laterality: N/A;   SPINE SURGERY     TONSILLECTOMY     TONSILLECTOMY AND ADENOIDECTOMY Bilateral 09/08/2013   Procedure: TONSILLECTOMY ;  Surgeon: Melvenia Beam, MD;  Location: [facility] OR;  Service: ENT;  Laterality: Bilateral;   TUBAL LIGATION     VAGINAL HYSTERECTOMY N/A 04/12/2014   Procedure: LAPAROSCOPIC ASSISTED  VAGINAL HYSTERECTOMY;  Surgeon: Adam Phenix, MD;  Location: [facility];  Service: Gynecology;  Laterality: N/A;   WISDOM TOOTH EXTRACTION     Social History   Occupational History   Not on file  Tobacco Use   Smoking status: Former    Current packs/day: 0.00    Types: Cigarettes    Quit date: 04/24/2020    Years since quitting: 2.9   Smokeless tobacco: Never   Tobacco comments:    4-5 cigarettes a day  Vaping Use   Vaping status: Never Used  Substance and Sexual Activity   Alcohol use: No   Drug use: No   Sexual activity: Not on file

## 2023-03-29 ENCOUNTER — Other Ambulatory Visit (HOSPITAL_COMMUNITY): Payer: Self-pay | Admitting: Internal Medicine

## 2023-04-07 ENCOUNTER — Encounter: Payer: Self-pay | Admitting: Sports Medicine

## 2023-04-07 ENCOUNTER — Ambulatory Visit: Payer: MEDICAID | Admitting: Sports Medicine

## 2023-04-07 ENCOUNTER — Other Ambulatory Visit: Payer: Self-pay

## 2023-04-07 DIAGNOSIS — M170 Bilateral primary osteoarthritis of knee: Secondary | ICD-10-CM | POA: Diagnosis not present

## 2023-04-07 DIAGNOSIS — M25562 Pain in left knee: Secondary | ICD-10-CM | POA: Diagnosis not present

## 2023-04-07 DIAGNOSIS — G8929 Other chronic pain: Secondary | ICD-10-CM

## 2023-04-07 MED ORDER — METHYLPREDNISOLONE ACETATE 40 MG/ML IJ SUSP
40.0000 mg | INTRAMUSCULAR | Status: AC | PRN
Start: 2023-04-07 — End: 2023-04-07
  Administered 2023-04-07: 40 mg via INTRA_ARTICULAR

## 2023-04-07 MED ORDER — BUPIVACAINE HCL 0.25 % IJ SOLN
2.0000 mL | INTRAMUSCULAR | Status: AC | PRN
Start: 2023-04-07 — End: 2023-04-07
  Administered 2023-04-07: 2 mL via INTRA_ARTICULAR

## 2023-04-07 MED ORDER — LIDOCAINE HCL 1 % IJ SOLN
2.0000 mL | INTRAMUSCULAR | Status: AC | PRN
Start: 2023-04-07 — End: 2023-04-07
  Administered 2023-04-07: 2 mL

## 2023-04-07 NOTE — Progress Notes (Signed)
Patient says that she got relief from the injection into her right knee a couple of weeks ago, although does not think it worked as well as the time before that. She says that now it is just the left knee that keeps her up at night.   Patient says that her pain doctor recommended a "chip" into the knees for pain.

## 2023-04-07 NOTE — Progress Notes (Signed)
Cynthia Becker - 52 y.o. female MRN [id number]  Date of birth: [birth year]  Office Visit Note: Visit Date: 04/07/2023 PCP: Ivonne Andrew, NP Referred by: Ivonne Andrew, NP  Subjective: Chief Complaint  Patient presents with   Left Knee - Pain   HPI: Cynthia Becker is a pleasant 52 y.o. female who presents today for left knee pain with bilateral knee osteoarthritis.   Left knee pain -she has known advanced arthritis.  She continues working on weight loss.  She has lost another 8 pounds since her last visit.  She has had an injection under ultrasound guidance with me in the past which provided her a few months of relief.  She is interested in repeating this today.  Her right knee is doing well after her previous injection.   Medicine/Treatment: Ibuprofen 800 mg 3 times daily as needed, chronic Percocet 10-325 mg every 4 hours as needed.   She has been improving her glucose control, she is managed on Ozempic 2 mg IM once weekly.  Lab Results  Component Value Date   HGBA1C 5.9 (H) 02/14/2023   Pertinent ROS were reviewed with the patient and found to be negative unless otherwise specified above in HPI.   Assessment & Plan: Visit Diagnoses:  1. Chronic pain of left knee   2. Bilateral primary osteoarthritis of knee    Plan: Cynthia Becker has advanced bilateral knee osteoarthritis, she is working on healthy weight loss that she is not a surgical candidate at this time.  The left knee has been bothering her and she is interested in injection therapy, through shared decision making we did proceed with ultrasound-guided intra-articular knee injection.  She may use ice, Tylenol and/or over-the-counter anti-inflammatories for any postinjection pain. he may continue use of her chronic Percocet 10-325 mg every 4-6 hours as needed for both of her knee pain.  Encouraged to continue working on healthy weight loss, commended her on her weight loss thus far.  She did ask me about a knee check for  osteoarthritis -wondering if this is more of a nerve stimulator.  She will message me through MyChart the record/technology for this and I can review.  In the future, could consider sending to anesthesia for radiofrequency ablation trial.  Follow-up: Return in about 3 months (around 07/08/2023) for for knees (30-min f/u if wants inj).   Meds & Orders: No orders of the defined types were placed in this encounter.   Orders Placed This Encounter  Procedures   Large Joint Inj: L knee   US Guided Needle Placement - No Linked Charges     Procedures: Large Joint Inj: L knee on 04/07/2023 4:05 PM Indications: pain Details: 22 G 1.5 in and 3.5 in needle, ultrasound-guided superolateral approach Medications: 2 mL lidocaine 1 %; 2 mL bupivacaine 0.25 %; 40 mg methylPREDNISolone acetate 40 MG/ML Outcome: tolerated well, no immediate complications  Procedure: Ultrasound-guided Knee Injection, Left After discussion on risk/benefits/indication, informed verbal consent was obtained. A timeout was then performed. The patient was lying in supine on examination table with a small bolster underneath the affected knee for comfort. The patient's knee was prepped with Betadine and alcohol swabs. Utilizing ultrasound-guidance with the probe in a transverse position, the patient's suprapatellar bursa was identified and the knee joint was subsequently injected intraarticularly with a mixture of 2:2:1 lidocaine:bupivicaine:methylprednisolone utilizing an in-plane visualization approach. Patient tolerated the procedure well without immediate complications.  Procedure, treatment alternatives, risks and benefits explained, specific risks discussed. Consent was given by  the patient. Immediately prior to procedure a time out was called to verify the correct patient, procedure, equipment, support staff and site/side marked as required. Patient was prepped and draped in the usual sterile fashion.          Clinical  History: No specialty comments available.  She reports that she quit smoking about 2 years ago. Her smoking use included cigarettes. She has never used smokeless tobacco.  Recent Labs    05/20/22 1550 02/14/23 1617  HGBA1C 7.9* 5.9*    Objective:   Vital Signs: LMP 02/13/2014   Physical Exam  Gen: Well-appearing, in no acute distress; non-toxic CV: Well-perfused. Warm.  Resp: Breathing unlabored on room air; no wheezing. Psych: Fluid speech in conversation; appropriate affect; normal thought process Neuro: Sensation intact throughout. No gross coordination deficits.   Ortho Exam -Left knee: No significant joint effusion.  Range of motion from 0-120/125 degrees.  There are some mild TTP over the medial joint line.  The knee does fall into a slightly varus fashion with standing.  Imaging: No results found.  Past Medical/Family/Surgical/Social History: Medications & Allergies reviewed per EMR, new medications updated. Patient Active Problem List   Diagnosis Date Noted   Class 3 severe obesity due to excess calories with body mass index (BMI) greater than or equal to 70 in adult [facility]) 02/20/2023   Chronic pain of both knees 11/08/2022   Osteoarthritis of both knees    Influenza A 05/17/2021   Flu 05/16/2021   Acute on chronic respiratory failure with hypoxia and hypercapnia (HCC) 04/17/2021   Abnormal CT of brain 04/17/2021   Polycythemia 04/17/2021   Chronic back pain 04/17/2021   Leukocytosis 04/17/2021   Episode of unresponsiveness 04/17/2021   Acute respiratory failure with hypoxia (HCC) 10/02/2020   Chronic diastolic CHF (congestive heart failure) (HCC) 04/29/2020   Essential hypertension 04/25/2020   Community acquired bacterial pneumonia 04/25/2020   Class 3 obesity 04/25/2020   Diabetes mellitus due to underlying condition with diabetic autonomic neuropathy, unspecified whether long term insulin use (HCC) 04/24/2020   Acute respiratory failure (HCC) 04/24/2020    Asthmatic bronchitis 03/21/2020   Nocturnal hypoxia 03/21/2020   OSA (obstructive sleep apnea) 12/22/2019   Shortness of breath 08/09/2019   No-show for appointment 06/30/2019   Morbid obesity with BMI of 60.0-69.9, adult (HCC) 09/14/2014   Iron deficiency anemia due to chronic blood loss 09/14/2014   Constipation 09/14/2014   Postoperative state 04/12/2014   Uterine fibroid 03/17/2014   DUB (dysfunctional uterine bleeding) 01/27/2014   Iron deficiency anemia due to chronic blood loss 04/09/2013   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   Menometrorrhagia 04/09/2013   Left hand pain 09/18/2011   High blood pressure    ANEMIA, IRON DEFICIENCY 08/06/2010   CHOLECYSTECTOMY, HX OF 04/09/2010   Dyslipidemia 02/13/2010   DEGENERATIVE DISC DISEASE, LUMBOSACRAL SPINE W/RADICULOPATHY 11/01/2009   BACK PAIN, LUMBAR, WITH RADICULOPATHY 10/30/2009   TRIGGER FINGER 02/03/2009   Carpal tunnel syndrome 12/21/2008   Headache(784.0) 11/09/2008   Depression 10/04/2008   URI 08/24/2008   INSOMNIA 08/24/2008   Asthma 06/06/2008   Morbid obesity (HCC) 05/24/2008   ESSENTIAL HYPERTENSION 05/24/2008   Past Medical History:  Diagnosis Date   Anemia    iv iron treatment dec 2014/ see oncology for this last in dec 2014   Anxiety    Arthritis    knee, hands   Asthma    seasonal related/ albuterol used when uri   CHF (congestive heart failure) (HCC)  CN (constipation) 09/14/2014   DEGENERATIVE DISC DISEASE, LUMBOSACRAL SPINE W/RADICULOPATHY 11/01/2009   Qualifier: Diagnosis of  By: Huntley Dec, [place]     Depression    Diabetes (HCC)    H/O dizziness    Hx   Headache(784.0)    otc med prn   High blood pressure    HYPERLIPIDEMIA 02/13/2010   Qualifier: Diagnosis of  By: Huntley Dec, Scott     INSOMNIA 08/24/2008   Qualifier: Diagnosis of  By: Barbaraann Barthel MD, [place]     Morbid obesity (HCC) 05/24/2008   Qualifier: Diagnosis of  By: Barbaraann Barthel MD, [place]     SVD (spontaneous vaginal delivery)    x 3    Family History  Problem Relation Age of Onset   Diabetes Mother    Hypertension Father    Heart disease Other    Diabetes Other    Hypertension Other    Alcohol abuse Other    Hypertension Brother    Asthma Brother    Hypertension Brother    Asthma Brother    COPD Maternal Grandmother    Lung cancer Maternal Grandmother    Asthma Paternal Grandmother    Asthma Paternal Aunt    Past Surgical History:  Procedure Laterality Date   ABDOMINAL HYSTERECTOMY  03/13/2014   total    BILATERAL SALPINGECTOMY Bilateral 04/12/2014   Procedure: BILATERAL SALPINGECTOMY;  Surgeon: Adam Phenix, MD;  Location: [facility];  Service: Gynecology;  Laterality: Bilateral;   CARPAL TUNNEL RELEASE  08/30/2011   Procedure: CARPAL TUNNEL RELEASE;  Surgeon: Nicki Reaper, MD;  [facility];  Service: Orthopedics;  Laterality: Right;   CHOLECYSTECTOMY  10/11   lap choli   GALLBLADDER SURGERY  04/04/2010   HYSTEROSCOPY N/A 02/08/2014   Procedure: HYSTEROSCOPY;  Surgeon: Adam Phenix, MD;  Location: [facility];  Service: Gynecology;  Laterality: N/A;   HYSTEROSCOPY WITH NOVASURE N/A 02/08/2014   Procedure: ATTEMPTED NOVASURE ABLATION;  Surgeon: Adam Phenix, MD;  Location: [facility];  Service: Gynecology;  Laterality: N/A;   INTRAUTERINE DEVICE INSERTION     07/19/2013   SEPTOPLASTY N/A 09/08/2013   Procedure: SEPTOPLASTY;  Surgeon: Melvenia Beam, MD;  Location: [facility] OR;  Service: ENT;  Laterality: N/A;   SPINE SURGERY     TONSILLECTOMY     TONSILLECTOMY AND ADENOIDECTOMY Bilateral 09/08/2013   Procedure: TONSILLECTOMY ;  Surgeon: Melvenia Beam, MD;  Location: [facility] OR;  Service: ENT;  Laterality: Bilateral;   TUBAL LIGATION     VAGINAL HYSTERECTOMY N/A 04/12/2014   Procedure: LAPAROSCOPIC ASSISTED VAGINAL HYSTERECTOMY;  Surgeon: Adam Phenix, MD;  Location: [facility];  Service: Gynecology;  Laterality: N/A;   WISDOM TOOTH EXTRACTION     Social History   Occupational History   Not on file   Tobacco Use   Smoking status: Former    Current packs/day: 0.00    Types: Cigarettes    Quit date: 04/24/2020    Years since quitting: 2.9   Smokeless tobacco: Never   Tobacco comments:    4-5 cigarettes a day  Vaping Use   Vaping status: Never Used  Substance and Sexual Activity   Alcohol use: No   Drug use: No   Sexual activity: Not on file

## 2023-04-21 NOTE — Progress Notes (Signed)
ADVANCED HF CLINIC NOTE   PCP: Thad Ranger NP  Pulmonology: Dr. Chestine Spore Primary Cardiologist: Dr Rosemary Holms  HF Cardiologist: Dr. Gala Romney  HPI: Cynthia Becker is a 52 y.o. female referred by Orion Crook, NP for chronic diastolic CHF.  Has history of chronic diastolic CHF, HTN, morbid obesity, type II DM, HLD tobacco use, asthma, chronic respiratory failure with hypoxia on supplemental O2.    She's had multiple admissions over the last few years with a/c respiratory failure with hypoxia secondary to a/c diastolic CHF.    Echo 04/22: EF 60-65%, RV not well visualized, study technically difficult d/t body habitus.   Admitted 12/22 with a/c respiratory failure with hypoxia secondary to influenza A pneumonia and a/c CHF. Diuresed with IV lasix. Discharged on torsemide 40 mg daily. Course complicated by encephalopathy, felt to be likely d/t CO2 narcosis vs narcotic use.   Saw her PCP for f/u 06/12/21. Noted 20 lb weight gain after discharge. Torsemide increased to 40 mg bid. She was subsequently referred to [facility] for evaluation.  First seen in AHF clinic 09/21/21, markedly volume overloaded, weight 464 lbs. Spiro 12.5 added, and instructed to take metolazone 2x/week. Zio-14 day placed at follow up due to palpitations.  Follow up 6/23, she was volume overloaded with NYHA IIIb symptoms. Torsemide increased to 100 bid and metolazone added twice a week. Discussed using Furoscix in the future and started approval process.  Called clinic 01/04/22 after she had use a Furoscix cartridge. Used Furoscix x 3 doses 7/21, 7/22, and 7/23.  Started ozempic 12/23. She has lost about 40 lbs.   Today she returns for AHF follow up with her husband. She had "chest pain" in R shoulder area. Suspect it was muscle related as it progressively improved, she used pain killers from her knee and they helped. Overall feeling ok. Denies palpitations, CP, dizziness, or PND/Orthopnea. Intermittent  edema. SOB with ambulation. Appetite fluctuates with ozempic. No fever or chills. Weight at home 430 pounds. Taking all medications. Loves to drink grape juice.   Cardiac Studies: - Echo 04/15/22 EF 60-65% RV ok - Echo (4/22): EF 60-65%, RV not well visualized, study technically difficult d/t body habitus. - Echo (11/21): EF 60-65%, moderate LVH, grade II DD, RV normal - Echo (2/21): EF 50-55%, grade II DD, moderate LVH, technically difficult study.  ROS: All systems reviewed and negative except as per HPI.   Past Medical History:  Diagnosis Date   Anemia    iv iron treatment dec 2014/ see oncology for this last in dec 2014   Anxiety    Arthritis    knee, hands   Asthma    seasonal related/ albuterol used when uri   CHF (congestive heart failure) (HCC)    CN (constipation) 09/14/2014   DEGENERATIVE DISC DISEASE, LUMBOSACRAL SPINE W/RADICULOPATHY 11/01/2009   Qualifier: Diagnosis of  By: Huntley Dec, Scott     Depression    Diabetes (HCC)    H/O dizziness    Hx   Headache(784.0)    otc med prn   High blood pressure    HYPERLIPIDEMIA 02/13/2010   Qualifier: Diagnosis of  By: Huntley Dec, Scott     INSOMNIA 08/24/2008   Qualifier: Diagnosis of  By: Barbaraann Barthel MD, [place]     Morbid obesity (HCC) 05/24/2008   Qualifier: Diagnosis of  By: Barbaraann Barthel MD, [place]     SVD (spontaneous vaginal delivery)    x 3   Current Outpatient Medications  Medication Sig Dispense Refill  ACCU-CHEK FASTCLIX LANCETS MISC USE TO CHECK BLOOD SUGAR TWICE DAILY  11   ACCU-CHEK GUIDE test strip USE 1 STRIP TO CHECK BLOOD SUGAR THREE TIMES DAILY 250 each 0   albuterol (PROVENTIL) (2.5 MG/3ML) 0.083% nebulizer solution USE 1 VIAL IN NEBULIZER EVERY 6 HOURS AS NEEDED FOR WHEEZING OR SHORTNESS OF BREATH 150 mL 1   blood glucose meter kit and supplies KIT Dispense based on patient and insurance preference. Use up to four times daily as directed. 1 each 0   cyclobenzaprine (FLEXERIL) 5 MG tablet Take 1 tablet (5 mg  total) by mouth 3 (three) times daily as needed. for muscle spams 90 tablet 0   ENTRESTO 49-51 MG TAKE 1 TABLET BY MOUTH TWICE DAILY . APPOINTMENT REQUIRED FOR FUTURE REFILLS 60 tablet 0   EQ ALLERGY RELIEF, CETIRIZINE, 10 MG tablet Take 1 tablet by mouth once daily (Patient taking differently: Take 10 mg by mouth daily as needed.) 90 tablet 0   gabapentin (NEURONTIN) 800 MG tablet Take 1 tablet (800 mg total) by mouth 3 (three) times daily. 270 tablet 0   hydrOXYzine (ATARAX) 10 MG tablet TAKE 1 TABLET BY MOUTH THREE TIMES DAILY AS NEEDED FOR ITCHING 30 tablet 0   ibuprofen (ADVIL) 800 MG tablet Take 800 mg by mouth every 8 (eight) hours as needed for moderate pain.     LINZESS 290 MCG CAPS capsule TAKE 1 CAPSULE BY MOUTH ONCE DAILY BEFORE BREAKFAST 30 capsule 0   naloxone (NARCAN) nasal spray 4 mg/0.1 mL Place 1 spray into the nose as needed.     omeprazole (PRILOSEC) 20 MG capsule Take 1 capsule (20 mg total) by mouth at bedtime. 90 capsule 0   oxyCODONE-acetaminophen (PERCOCET) 10-325 MG tablet Take 1 tablet by mouth every 4 (four) hours as needed for pain.     potassium chloride SA (KLOR-CON M) 20 MEQ tablet Take 1 tablet (20 mEq total) by mouth daily. 30 tablet 11   promethazine (PHENERGAN) 25 MG tablet Take 25 mg by mouth every 8 (eight) hours as needed for nausea or vomiting.     Semaglutide, 2 MG/DOSE, (OZEMPIC, 2 MG/DOSE,) 8 MG/3ML SOPN Inject 2 mg into the skin once a week. 3 mL 11   spironolactone (ALDACTONE) 25 MG tablet Take 1 tablet (25 mg total) by mouth daily. 90 tablet 3   torsemide (DEMADEX) 20 MG tablet Take 3 tablets (60 mg total) by mouth 2 (two) times daily. 565 tablet 2   No current facility-administered medications for this encounter.   No Known Allergies  Social History   Socioeconomic History   Marital status: Married    Spouse name: Not on file   Number of children: 3   Years of education: Not on file   Highest education level: Not on file  Occupational History    Not on file  Tobacco Use   Smoking status: Former    Current packs/day: 0.00    Types: Cigarettes    Quit date: 04/24/2020    Years since quitting: 3.0   Smokeless tobacco: Never   Tobacco comments:    4-5 cigarettes a day  Vaping Use   Vaping status: Never Used  Substance and Sexual Activity   Alcohol use: No   Drug use: No   Sexual activity: Not on file  Other Topics Concern   Not on file  Social History Narrative   Not on file   Social Determinants of Health   Financial Resource Strain: Not on file  Food Insecurity: Not on file  Transportation Needs: Not on file  Physical Activity: Not on file  Stress: Not on file  Social Connections: Not on file  Intimate Partner Violence: Not on file   Family History  Problem Relation Age of Onset   Diabetes Mother    Hypertension Father    Heart disease Other    Diabetes Other    Hypertension Other    Alcohol abuse Other    Hypertension Brother    Asthma Brother    Hypertension Brother    Asthma Brother    COPD Maternal Grandmother    Lung cancer Maternal Grandmother    Asthma Paternal Grandmother    Asthma Paternal Aunt    BP 102/74   Pulse 94   Wt (!) 192.5 kg (424 lb 6.4 oz)   LMP 02/13/2014   SpO2 93%   BMI 70.62 kg/m   Wt Readings from Last 3 Encounters:  05/01/23 (!) 192.5 kg (424 lb 6.4 oz)  02/14/23 (!) 197.8 kg (436 lb)  11/08/22 (!) 199.6 kg (440 lb)   EKG NSR 90s (Personally reviewed)    PHYSICAL EXAM: General:  well appearing.  No respiratory difficulty HEENT: normal Neck: supple. JVD difficult to see thick neck. Carotids 2+ bilat; no bruits. No lymphadenopathy or thyromegaly appreciated. Cor: PMI nondisplaced. Regular rate & rhythm. No rubs, gallops or murmurs. Lungs: clear, diminished bases Abdomen: obese. soft, nontender, nondistended. No hepatosplenomegaly. No bruits or masses. Good bowel sounds. Extremities: no cyanosis, clubbing, rash, trace BLE edema  Neuro: alert & oriented x 3,  cranial nerves grossly intact. moves all 4 extremities w/o difficulty. Affect pleasant.   ASSESSMENT & PLAN: 1. Chronic HFpEF  - Echo (4/22): EF 60-65%, RV poorly visualized. - Echo 04/15/22 EF 60-65% RV ok  - NYHA III-IIIb, functional status limited by body habitus and OA.  - Volume ok  - Continue torsemide 60 mg bid w/ 20 KCL  - Continue spironolactone 25 mg daily.  - Stop carvedilol, has been out of it for 2 months. Diastolic HF.  - Continue Entresto 49/51 bid - No SGLT2i due hygeine.  - Check labs  2. Palpitations - Resolved - Limit albuterol  3. Morbid Obesity - Body mass index is 70.62 kg/m. - Continue Ozempic, has lost ~40 lbs. Congratulated her!  - Will re refer to pharmacy, feels like she's hit a weight loss plateau. Feels like its only effective for 5/7 days of the week. Mounjaro?    4. DMII - Followed by PCP. - On insulin. - Continue Ozempic   5. OA - She is not a candidate for knee replacemen due to obesity - Follows in pain clinic for injections and narcotics.  6. Nocturnal hypoxia - Continue 2L oxygen at night (uses PRN). - No OSA on sleep study (4/19), AHI 4.4  7. Essential HTN - BP stable - Continue Entresto 49/51 bid  Follow up in 6 months with Dr. Glenice Laine AGACNP-BC  4:13 PM

## 2023-04-29 ENCOUNTER — Other Ambulatory Visit (HOSPITAL_COMMUNITY): Payer: Self-pay | Admitting: Internal Medicine

## 2023-05-01 ENCOUNTER — Ambulatory Visit (HOSPITAL_COMMUNITY)
Admission: RE | Admit: 2023-05-01 | Discharge: 2023-05-01 | Disposition: A | Discharge status: Home / Self Care | Payer: MEDICAID | Source: Ambulatory Visit | Attending: Internal Medicine | Admitting: Internal Medicine

## 2023-05-01 ENCOUNTER — Encounter (HOSPITAL_COMMUNITY): Payer: Self-pay

## 2023-05-01 VITALS — BP 102/74 | HR 94 | Wt >= 6400 oz

## 2023-05-01 DIAGNOSIS — R002 Palpitations: Secondary | ICD-10-CM | POA: Insufficient documentation

## 2023-05-01 DIAGNOSIS — E119 Type 2 diabetes mellitus without complications: Secondary | ICD-10-CM | POA: Insufficient documentation

## 2023-05-01 DIAGNOSIS — J9611 Chronic respiratory failure with hypoxia: Secondary | ICD-10-CM | POA: Insufficient documentation

## 2023-05-01 DIAGNOSIS — Z87891 Personal history of nicotine dependence: Secondary | ICD-10-CM | POA: Insufficient documentation

## 2023-05-01 DIAGNOSIS — R9431 Abnormal electrocardiogram [ECG] [EKG]: Secondary | ICD-10-CM | POA: Diagnosis not present

## 2023-05-01 DIAGNOSIS — Z6841 Body Mass Index (BMI) 40.0 and over, adult: Secondary | ICD-10-CM | POA: Diagnosis not present

## 2023-05-01 DIAGNOSIS — G4734 Idiopathic sleep related nonobstructive alveolar hypoventilation: Secondary | ICD-10-CM

## 2023-05-01 DIAGNOSIS — Z79899 Other long term (current) drug therapy: Secondary | ICD-10-CM | POA: Diagnosis not present

## 2023-05-01 DIAGNOSIS — Z833 Family history of diabetes mellitus: Secondary | ICD-10-CM | POA: Diagnosis not present

## 2023-05-01 DIAGNOSIS — E785 Hyperlipidemia, unspecified: Secondary | ICD-10-CM | POA: Insufficient documentation

## 2023-05-01 DIAGNOSIS — Z8249 Family history of ischemic heart disease and other diseases of the circulatory system: Secondary | ICD-10-CM | POA: Insufficient documentation

## 2023-05-01 DIAGNOSIS — I5032 Chronic diastolic (congestive) heart failure: Secondary | ICD-10-CM | POA: Diagnosis not present

## 2023-05-01 DIAGNOSIS — I110 Hypertensive heart disease with heart failure: Secondary | ICD-10-CM | POA: Diagnosis present

## 2023-05-01 DIAGNOSIS — Z7985 Long-term (current) use of injectable non-insulin antidiabetic drugs: Secondary | ICD-10-CM | POA: Insufficient documentation

## 2023-05-01 DIAGNOSIS — Z794 Long term (current) use of insulin: Secondary | ICD-10-CM | POA: Diagnosis not present

## 2023-05-01 DIAGNOSIS — E0843 Diabetes mellitus due to underlying condition with diabetic autonomic (poly)neuropathy: Secondary | ICD-10-CM

## 2023-05-01 DIAGNOSIS — Z9981 Dependence on supplemental oxygen: Secondary | ICD-10-CM | POA: Diagnosis not present

## 2023-05-01 DIAGNOSIS — I10 Essential (primary) hypertension: Secondary | ICD-10-CM

## 2023-05-01 DIAGNOSIS — M1990 Unspecified osteoarthritis, unspecified site: Secondary | ICD-10-CM

## 2023-05-01 LAB — BASIC METABOLIC PANEL
Anion gap: 9 (ref 5–15)
BUN: 16 mg/dL (ref 6–20)
CO2: 28 mmol/L (ref 22–32)
Calcium: 8.6 mg/dL — ABNORMAL LOW (ref 8.9–10.3)
Chloride: 100 mmol/L (ref 98–111)
Creatinine, Ser: 1.43 mg/dL — ABNORMAL HIGH (ref 0.44–1.00)
GFR, Estimated: 44 mL/min — ABNORMAL LOW (ref >60)
Glucose, Bld: 85 mg/dL (ref 70–99)
Potassium: 4.4 mmol/L (ref 3.5–5.1)
Sodium: 137 mmol/L (ref 135–145)

## 2023-05-01 LAB — BRAIN NATRIURETIC PEPTIDE: B Natriuretic Peptide: 13.9 pg/mL (ref 0.0–100.0)

## 2023-05-01 MED ORDER — SPIRONOLACTONE 25 MG PO TABS: 25.0000 mg | ORAL_TABLET | Freq: Every day | ORAL | 3 refills | Status: DC

## 2023-05-01 MED ORDER — TORSEMIDE 20 MG PO TABS: 60.0000 mg | ORAL_TABLET | Freq: Two times a day (BID) | ORAL | 2 refills | Status: DC

## 2023-05-01 NOTE — Patient Instructions (Addendum)
Labs done today. We will contact you only if your labs are abnormal.  Medication: Stop Carvedilol  No other medication changes were made. Please continue all current medications as prescribed.   The pharmacy clinic will be in contact with you.   Your physician recommends that you schedule a follow-up appointment in: 6 months with Dr. Gala Romney. Please contact our office in March 2025 to schedule a May 2025 appointment.   If you have any questions or concerns before your next appointment please send [place] a message through [place] or call our office at [phone].    TO LEAVE A MESSAGE FOR THE NURSE SELECT OPTION 2, PLEASE LEAVE A MESSAGE INCLUDING: YOUR NAME DATE OF BIRTH CALL BACK NUMBER REASON FOR CALL**this is important as we prioritize the call backs  YOU WILL RECEIVE A CALL BACK THE SAME DAY AS LONG AS YOU CALL BEFORE 4:00 PM   Do the following things EVERYDAY: Weigh yourself in the morning before breakfast. Write it down and keep it in a log. Take your medicines as prescribed Eat low salt foods--Limit salt (sodium) to 2000 mg per day.  Stay as active as you can everyday Limit all fluids for the day to less than 2 liters   At the [facility], you and your health needs are our priority. As part of our continuing mission to provide you with exceptional heart care, we have created designated Provider Care Teams. These Care Teams include your primary Cardiologist (physician) and Advanced Practice Providers (APPs- Physician Assistants and Nurse Practitioners) who all work together to provide you with the care you need, when you need it.   You may see any of the following providers on your designated Care Team at your next follow up: Dr Arvilla Meres Dr Marca Ancona Dr. Marcos Eke, NP Robbie Lis, [place] [facility] [place], [place] Brynda Peon, NP Karle Plumber, PharmD   Please be sure to bring in all your medications bottles to  every appointment.    Thank you for choosing [facility]

## 2023-05-19 ENCOUNTER — Ambulatory Visit (INDEPENDENT_AMBULATORY_CARE_PROVIDER_SITE_OTHER): Payer: MEDICAID | Admitting: Nurse Practitioner

## 2023-05-19 ENCOUNTER — Encounter: Payer: Self-pay | Admitting: Nurse Practitioner

## 2023-05-19 VITALS — BP 125/92 | HR 93 | Resp 14 | Ht 65.0 in | Wt >= 6400 oz

## 2023-05-19 DIAGNOSIS — G629 Polyneuropathy, unspecified: Secondary | ICD-10-CM

## 2023-05-19 DIAGNOSIS — E119 Type 2 diabetes mellitus without complications: Secondary | ICD-10-CM | POA: Diagnosis not present

## 2023-05-19 LAB — POCT GLYCOSYLATED HEMOGLOBIN (HGB A1C): HbA1c, POC (controlled diabetic range): 5.8 % (ref 0.0–7.0)

## 2023-05-19 MED ORDER — GABAPENTIN 300 MG PO CAPS: 900.0000 mg | ORAL_CAPSULE | Freq: Three times a day (TID) | ORAL | 2 refills | Status: DC

## 2023-05-19 NOTE — Patient Instructions (Signed)
1. Type 2 diabetes mellitus without complication, unspecified whether long term insulin use (HCC)  - POCT glycosylated hemoglobin (Hb A1C) - AMB Referral VBCI Care Management  2. Polyneuropathy  - gabapentin (NEURONTIN) 300 MG capsule; Take 3 capsules (900 mg total) by mouth 3 (three) times daily.  Dispense: 270 capsule; Refill: 2  Follow up:  Follow up in 3 months

## 2023-05-19 NOTE — Progress Notes (Signed)
Subjective   Patient ID: Cynthia Becker, female    DOB: Jun 21, 1971, 52 y.o.   MRN: [id number]  Chief Complaint  Patient presents with   Follow-up    Referring provider: Ivonne Andrew, NP  Cynthia Becker is a 52 y.o. female with Past Medical History: No date: Anemia     Comment:  iv iron treatment dec 2014/ see oncology for this last               in dec 2014 No date: Anxiety No date: Arthritis     Comment:  knee, hands No date: Asthma     Comment:  seasonal related/ albuterol used when uri No date: CHF (congestive heart failure) (HCC) 09/14/2014: CN (constipation) 11/01/2009: DEGENERATIVE DISC DISEASE, LUMBOSACRAL SPINE W/ RADICULOPATHY     Comment:  Qualifier: Diagnosis of  By: Huntley Dec, Scott   No date: Depression No date: Diabetes (HCC) No date: H/O dizziness     Comment:  Hx No date: Headache(784.0)     Comment:  otc med prn No date: High blood pressure 02/13/2010: HYPERLIPIDEMIA     Comment:  Qualifier: Diagnosis of  By: Huntley Dec, Scott   08/24/2008: INSOMNIA     Comment:  Qualifier: Diagnosis of  By: Barbaraann Barthel MD, Benetta Spar   05/24/2008: Morbid obesity (HCC)     Comment:  Qualifier: Diagnosis of  By: Barbaraann Barthel MD, [place]   No date: SVD (spontaneous vaginal delivery)     Comment:  x 3   HPI  Patient presents today for a follow-up visit for hypertension and diabetes.  Overall she has been doing well.  We will refer her to medical weight management.  Her BMI is greater than 72. She has started ozempic. Has lost 6 pounds.  The patient does complain of continuing low back pain and right leg pain.  We will refill Neurontin today and increase dosage due to patient having peripheral neuropathy. Patient followed with orthopedics.  Denies f/c/s, n/v/d, hemoptysis, PND, leg swelling. Denies chest pain or edema.    No Known Allergies  Immunization History  Administered Date(s) Administered   Influenza Whole 04/25/2010   Influenza,inj,Quad PF,6+ Mos 03/07/2014,  03/10/2019   Pneumococcal Polysaccharide-23 04/13/2014   Td 08/02/2010    Tobacco History: Social History   Tobacco Use  Smoking Status Former   Current packs/day: 0.00   Types: Cigarettes   Quit date: 04/24/2020   Years since quitting: 3.0  Smokeless Tobacco Never  Tobacco Comments   4-5 cigarettes a day   Counseling given: Not Answered Tobacco comments: 4-5 cigarettes a day   Outpatient Encounter Medications as of 05/19/2023  Medication Sig   ACCU-CHEK FASTCLIX LANCETS MISC USE TO CHECK BLOOD SUGAR TWICE DAILY   ACCU-CHEK GUIDE test strip USE 1 STRIP TO CHECK BLOOD SUGAR THREE TIMES DAILY   albuterol (PROVENTIL) (2.5 MG/3ML) 0.083% nebulizer solution USE 1 VIAL IN NEBULIZER EVERY 6 HOURS AS NEEDED FOR WHEEZING OR SHORTNESS OF BREATH   blood glucose meter kit and supplies KIT Dispense based on patient and insurance preference. Use up to four times daily as directed.   cyclobenzaprine (FLEXERIL) 5 MG tablet Take 1 tablet (5 mg total) by mouth 3 (three) times daily as needed. for muscle spams   ENTRESTO 49-51 MG TAKE 1 TABLET BY MOUTH TWICE DAILY . APPOINTMENT REQUIRED FOR FUTURE REFILLS   gabapentin (NEURONTIN) 300 MG capsule Take 3 capsules (900 mg total) by mouth 3 (three) times daily.   gabapentin (NEURONTIN) 800 MG tablet Take  1 tablet (800 mg total) by mouth 3 (three) times daily.   hydrOXYzine (ATARAX) 10 MG tablet TAKE 1 TABLET BY MOUTH THREE TIMES DAILY AS NEEDED FOR ITCHING   ibuprofen (ADVIL) 800 MG tablet Take 800 mg by mouth every 8 (eight) hours as needed for moderate pain.   LINZESS 290 MCG CAPS capsule TAKE 1 CAPSULE BY MOUTH ONCE DAILY BEFORE BREAKFAST   naloxone (NARCAN) nasal spray 4 mg/0.1 mL Place 1 spray into the nose as needed.   omeprazole (PRILOSEC) 20 MG capsule Take 1 capsule (20 mg total) by mouth at bedtime.   oxyCODONE-acetaminophen (PERCOCET) 10-325 MG tablet Take 1 tablet by mouth every 4 (four) hours as needed for pain.   potassium chloride SA  (KLOR-CON M) 20 MEQ tablet Take 1 tablet (20 mEq total) by mouth daily.   promethazine (PHENERGAN) 25 MG tablet Take 25 mg by mouth every 8 (eight) hours as needed for nausea or vomiting.   Semaglutide, 2 MG/DOSE, (OZEMPIC, 2 MG/DOSE,) 8 MG/3ML SOPN Inject 2 mg into the skin once a week.   spironolactone (ALDACTONE) 25 MG tablet Take 1 tablet (25 mg total) by mouth daily.   torsemide (DEMADEX) 20 MG tablet Take 3 tablets (60 mg total) by mouth 2 (two) times daily.   EQ ALLERGY RELIEF, CETIRIZINE, 10 MG tablet Take 1 tablet by mouth once daily (Patient not taking: Reported on 05/19/2023)   No facility-administered encounter medications on file as of 05/19/2023.    Review of Systems  Review of Systems  Constitutional: Negative.   HENT: Negative.    Cardiovascular: Negative.   Gastrointestinal: Negative.   Allergic/Immunologic: Negative.   Neurological: Negative.   Psychiatric/Behavioral: Negative.       Objective:   BP (!) 125/92 (BP Location: Right Arm, Patient Position: Sitting, Cuff Size: Large)   Pulse 93   Resp 14   Ht 5\' 5"  (1.651 m)   Wt (!) 436 lb (197.8 kg)   LMP 02/13/2014   SpO2 97%   BMI 72.55 kg/m   Wt Readings from Last 5 Encounters:  05/19/23 (!) 436 lb (197.8 kg)  05/01/23 (!) 424 lb 6.4 oz (192.5 kg)  02/14/23 (!) 436 lb (197.8 kg)  11/08/22 (!) 440 lb (199.6 kg)  04/15/22 (!) 465 lb 3.2 oz (211 kg)     Physical Exam Vitals and nursing note reviewed.  Constitutional:      General: She is not in acute distress.    Appearance: She is well-developed.  Cardiovascular:     Rate and Rhythm: Normal rate and regular rhythm.  Pulmonary:     Effort: Pulmonary effort is normal.     Breath sounds: Normal breath sounds.  Neurological:     Mental Status: She is alert and oriented to person, place, and time.       Assessment & Plan:   Type 2 diabetes mellitus without complication, unspecified whether long term insulin use (HCC) -     POCT glycosylated  hemoglobin (Hb A1C) -     AMB Referral VBCI Care Management  Polyneuropathy -     Gabapentin; Take 3 capsules (900 mg total) by mouth 3 (three) times daily.  Dispense: 270 capsule; Refill: 2     Return in about 3 months (around 08/19/2023).   Ivonne Andrew, NP 05/19/2023

## 2023-05-21 ENCOUNTER — Telehealth: Payer: Self-pay

## 2023-05-21 NOTE — Progress Notes (Signed)
   Care Guide Note  05/21/2023 Name: Cynthia Becker MRN: [id number] DOB: 1970/12/10  Referred by: Ivonne Andrew, NP Reason for referral : Care Coordination (Outreach to schedule with pharm d )   [name] is a 52 y.o. year old female who is a primary care patient of Ivonne Andrew, NP. [name] was referred to the pharmacist for assistance related to DM.    Successful contact was made with the patient to discuss pharmacy services including being ready for the pharmacist to call at least 5 minutes before the scheduled appointment time, to have medication bottles and any blood sugar or blood pressure readings ready for review. The patient agreed to meet with the pharmacist via with the pharmacist via telephone visit on (date/time).  06/24/2023  [name] , RMA     [place]  [facility], [facility] Guide  Direct Dial: [phone]  Website: [email or web address]

## 2023-05-26 ENCOUNTER — Other Ambulatory Visit (HOSPITAL_COMMUNITY): Payer: Self-pay | Admitting: Internal Medicine

## 2023-06-12 ENCOUNTER — Ambulatory Visit: Payer: Self-pay | Admitting: Nurse Practitioner

## 2023-06-12 NOTE — Telephone Encounter (Signed)
Chief Complaint: right foot pain  Symptoms: right foot burning pain, painful to touch, swelling  Frequency: x 5 days  Pertinent Negatives: Patient denies swelling in other parts of body, change in color (R foot), cool/cold to touch (R foot), injury involving R foot, rash, fever, chest pain, SOB  Disposition: [] ED /[] Urgent Care (no appt availability in office) / [x] Appointment(In office/virtual)/ []  [place]/ [] Home Care/ [] Refused Recommended Disposition /[]  Mobile Bus/ []  Follow-up with PCP Additional Notes: Patient c/o right foot pain and swelling x 5 days. Pt states she is a diabetic and suffers from neuropathy. Pt states she has been taking her oxycodone and gabapentin which help the pain some but states pain is 10/10 when she has to walk on it. Pt states she has been using crutches. Pt c/o numbness and tingling intermittent to both feet. Pt states right foot is warm and appears normal color. Pt denies any known injury to the foot and states she thinks it is gout. No appts available with PCP office, pt agreeable to schedule with [facility]. Pt requesting appt tomorrow afternoon due to her ride's schedule. Pt verbalizes understanding of concerning symptoms and when to call back.  Copied from CRM [id number]. Topic: Clinical - Red Word Triage >> Jun 12, 2023  8:10 AM Fonda Kinder J wrote: Red Word that prompted transfer to Nurse Triage: Severe Pain Reason for Disposition  [1] Swollen foot AND [2] no fever  (Exceptions: localized bump from bunions, calluses, insect bite, sting)  Answer Assessment - Initial Assessment Questions 1. ONSET: "When did the pain start?"      X 5 days  2. LOCATION: "Where is the pain located?"      Right foot  3. PAIN: "How bad is the pain?"    (Scale 1-10; or mild, moderate, severe)  - MILD (1-3): doesn't interfere with normal activities.   - MODERATE (4-7): interferes with normal activities (e.g., work or school) or awakens from  sleep, limping.   - SEVERE (8-10): excruciating pain, unable to do any normal activities, unable to walk.      9/10, pt states she just took her gabapentin and oxycodone.  4. WORK OR EXERCISE: "Has there been any recent work or exercise that involved this part of the body?"      Denies.  5. CAUSE: "What do you think is causing the foot pain?"     Pt unsure, she thinks it could be gout.  6. OTHER SYMPTOMS: "Do you have any other symptoms?" (e.g., leg pain, rash, fever, numbness)     Swelling in right foot. Intermittent numbness and tingling in both feet.  Protocols used: Foot Pain-A-AH

## 2023-06-13 ENCOUNTER — Ambulatory Visit: Payer: MEDICAID | Admitting: Family Medicine

## 2023-06-16 ENCOUNTER — Ambulatory Visit: Payer: MEDICAID | Admitting: Family Medicine

## 2023-06-16 NOTE — Progress Notes (Deleted)
   Acute Office Visit  Subjective:     Patient ID: Cynthia Becker, female    DOB: [birth year], 52 y.o.   MRN: [id number]  No chief complaint on file.   HPI Patient is in today for ***  ROS Per HPI      Objective:    LMP 02/13/2014    Physical Exam Vitals and nursing note reviewed.  Constitutional:      Appearance: Normal appearance. She is normal weight.  HENT:     Head: Normocephalic and atraumatic.     Right Ear: Tympanic membrane and ear canal normal.     Left Ear: Tympanic membrane and ear canal normal.     Nose: Nose normal.  Eyes:     Extraocular Movements: Extraocular movements intact.     Pupils: Pupils are equal, round, and reactive to light.  Cardiovascular:     Rate and Rhythm: Normal rate and regular rhythm.     Heart sounds: Normal heart sounds.  Pulmonary:     Effort: Pulmonary effort is normal.     Breath sounds: Normal breath sounds.  Musculoskeletal:        General: Normal range of motion.     Cervical back: Normal range of motion.  Neurological:     General: No focal deficit present.     Mental Status: She is alert and oriented to person, place, and time.  Psychiatric:        Mood and Affect: Mood normal.        Thought Content: Thought content normal.   No results found for any visits on 06/16/23.      Assessment & Plan:  ***  No orders of the defined types were placed in this encounter.   No follow-ups on file.  Moshe Cipro, FNP

## 2023-06-24 ENCOUNTER — Other Ambulatory Visit: Payer: Self-pay

## 2023-06-28 ENCOUNTER — Other Ambulatory Visit (HOSPITAL_COMMUNITY): Payer: Self-pay | Admitting: Internal Medicine

## 2023-07-01 ENCOUNTER — Other Ambulatory Visit: Payer: Self-pay

## 2023-07-01 DIAGNOSIS — E0843 Diabetes mellitus due to underlying condition with diabetic autonomic (poly)neuropathy: Secondary | ICD-10-CM

## 2023-07-01 NOTE — Progress Notes (Signed)
 07/01/2023 Name: Cynthia Becker MRN: [id number] DOB: 1971/04/08  Chief Complaint  Patient presents with   Diabetes   Medication Management    Cynthia Becker is a 53 y.o. year old female who presented for a telephone visit.   They were referred to the pharmacist by their PCP for assistance in managing diabetes.    Subjective:  Care Team: Primary Care Provider: Oley Bascom RAMAN, NP ; Next Scheduled Visit: 07/03/23  Medication Access/Adherence  Current Pharmacy:  [facility] [address] [place] - [address] [place] [address] Phone: [phone] Fax: [phone]   Patient reports affordability concerns with their medications: No  - has Medicaid Patient reports access/transportation concerns to their pharmacy: No  Patient reports adherence concerns with their medications:  No     Diabetes:  Current medications: Ozempic  2 mg weekly  Endorses some nausea with Ozempic  the day after administration, but states this is tolerable for her. Has lost ~40 lbs with Ozempic , but weight loss has plateaued recently. Trying to lose weight in order to be eligible for knee replacement surgery.  Current glucose readings: FBG 110s  Using glucometer; testing once or twice daily  Patient denies hypoglycemic s/sx including dizziness, shakiness, sweating. Patient denies hyperglycemic symptoms including polyuria, polydipsia, polyphagia, nocturia, neuropathy, blurred vision.  Current meal patterns: 1-2 meals/day - Breakfast: skips - Lunch: sandwich, fruit, leftover vegetables - Supper: chicken, vegetables, sometimes rice - Snacks: fruit (grapes, oranges, pears, applesauce) - Drinks: grape juice with sugar, 3 bottles of water per day  Current physical activity: no current physical activity, limited by OA knee pain   Heart Failure (EF 60-65%):  Current medications:  ACEi/ARB/ARNI: Entresto  49-51 mg BID SGLT2i: none  Beta blocker:  none Mineralocorticoid Receptor Antagonist: spironolactone  25 mg daily (not taking) Diuretic regimen: torsemide  60 mg BID  Current home blood pressure readings: 120/80 Current home weights: 431 lb   [facility]. Patient endorses mild shortness of breath with activity and lower extremity edema. Patient reports that she was instructed to stop carvedilol  and spironolactone  at last HF clinic visit 04/2023. Since then she has noticed increased LEE and chest congestion.  Current medication access support: has Medicaid   Objective:  Lab Results  Component Value Date   HGBA1C 5.8 05/19/2023    Lab Results  Component Value Date   CREATININE 1.43 (H) 05/01/2023   BUN 16 05/01/2023   NA 137 05/01/2023   K 4.4 05/01/2023   CL 100 05/01/2023   CO2 28 05/01/2023    Lab Results  Component Value Date   CHOL 200 (H) 05/14/2021   HDL 41 05/14/2021   LDLCALC 141 (H) 05/14/2021   TRIG 99 05/14/2021   CHOLHDL 4.9 (H) 05/14/2021    Medications Reviewed Today   Medications were not reviewed in this encounter       Assessment/Plan:   Diabetes: - Currently controlled based on A1c 5.8% - Reviewed goal A1c, goal fasting, and goal 2 hour post prandial glucose - Reviewed dietary modifications including switching to sugar free grape juice - Reviewed lifestyle modifications including: incorporating seated chair exercises to avoid knee pain when ambulating - Recommend to continue Ozempic  2 mg weekly. Discussed switching to Mounjaro for better weight loss benefit, however, Medicaid requires patients to have failed 2 preferred agents prior to coverage and her diabetes is well controlled on Ozempic . If coverage changes in the future, would recommend to revisit this. - Patient denies  personal or family history of multiple endocrine neoplasia type 2, medullary thyroid cancer; personal history of pancreatitis or gallbladder disease. - Recommend to check fasting  glucose once daily   Heart Failure: - Currently managed by [facility] - Reviewed appropriate blood pressure monitoring technique and reviewed goal blood pressure - Reviewed to weigh daily and when to contact cardiology with weight gain - Reviewed dietary modifications including limiting salt and fluid intake - Patient reported at last HF clinic visit in 04/2023 she was instructed to stop carvedilol  and spironolactone . Per HF clinic note she was to CONTINUE spironolactone  and stop carvedilol . She has had increased LEE and chest congestion since then. Educated her on medication plan and recommend to call [facility] tomorrow to confirm and discuss symptoms. She expressed understanding and stated she would call tomorrow.    Follow Up Plan: PCP visit 07/03/23 and pharmacist telephone visit 08/12/23  Izetta Henry, PharmD PGY-1 Pharmacy Resident

## 2023-07-03 ENCOUNTER — Ambulatory Visit: Payer: MEDICAID | Admitting: Nurse Practitioner

## 2023-07-08 ENCOUNTER — Encounter: Payer: Self-pay | Admitting: Oncology

## 2023-07-08 ENCOUNTER — Encounter: Payer: Self-pay | Admitting: Sports Medicine

## 2023-07-08 ENCOUNTER — Other Ambulatory Visit: Payer: Self-pay

## 2023-07-08 ENCOUNTER — Ambulatory Visit: Payer: MEDICAID | Admitting: Sports Medicine

## 2023-07-08 DIAGNOSIS — M1711 Unilateral primary osteoarthritis, right knee: Secondary | ICD-10-CM | POA: Diagnosis not present

## 2023-07-08 DIAGNOSIS — M25561 Pain in right knee: Secondary | ICD-10-CM | POA: Diagnosis not present

## 2023-07-08 DIAGNOSIS — M170 Bilateral primary osteoarthritis of knee: Secondary | ICD-10-CM

## 2023-07-08 DIAGNOSIS — G8929 Other chronic pain: Secondary | ICD-10-CM

## 2023-07-08 DIAGNOSIS — E0843 Diabetes mellitus due to underlying condition with diabetic autonomic (poly)neuropathy: Secondary | ICD-10-CM

## 2023-07-08 MED ORDER — LIDOCAINE HCL 1 % IJ SOLN
2.0000 mL | INTRAMUSCULAR | Status: AC | PRN
  Administered 2023-07-08: 2 mL

## 2023-07-08 MED ORDER — METHYLPREDNISOLONE ACETATE 40 MG/ML IJ SUSP
40.0000 mg | INTRAMUSCULAR | Status: AC | PRN
  Administered 2023-07-08: 40 mg via INTRA_ARTICULAR

## 2023-07-08 MED ORDER — BUPIVACAINE HCL 0.25 % IJ SOLN
2.0000 mL | INTRAMUSCULAR | Status: AC | PRN
  Administered 2023-07-08: 2 mL via INTRA_ARTICULAR

## 2023-07-08 NOTE — Progress Notes (Signed)
 Cynthia Becker - 53 y.o. female MRN [id number]  Date of birth: [birth year]  Office Visit Note: Visit Date: 07/08/2023 PCP: Oley Bascom RAMAN, NP Referred by: Oley Bascom RAMAN, NP  Subjective: Chief Complaint  Patient presents with   Left Knee - Pain, Follow-up   Right Knee - Follow-up, Pain   HPI: Cynthia Becker is a pleasant 53 y.o. female who presents today for R > L knee pain with known osteoarthritis.   She has known bilateral advanced osteoarthritis.  She has been working on weight loss, as she knows eventually she will need her knees replaced when she meets her BMI criteria.  We last gave injections into the knees back in September and early October and these gave her great relief up until about 2 weeks ago.  Her right knee is more bothersome than her left.  Medicine/Treatment: Ibuprofen  800 mg 3 times daily as needed, chronic Percocet 10-325 mg every 4 hours as needed.   She is a type-II diabetic, but has been improving her glucose control, she is managed on Ozempic  2 mg IM once weekly.   Lab Results  Component Value Date   HGBA1C 5.8 05/19/2023   Pertinent ROS were reviewed with the patient and found to be negative unless otherwise specified above in HPI.   Assessment & Plan: Visit Diagnoses:  1. Chronic pain of right knee   2. Bilateral primary osteoarthritis of knee   3. Morbid obesity (HCC)   4. Diabetes mellitus due to underlying condition with diabetic autonomic neuropathy, unspecified whether long term insulin  use (HCC)    Plan: Impression is bilateral advanced osteoarthritis of the knees, with a recent exacerbation of her chronic right knee pain.  She has received relief from ultrasound-guided injections in the past, through shared decision making we did proceed with right knee intra-articular injection, patient tolerated well.  She may use ice or over-the-counter anti-inflammatories for any postinjection pain.  For her chronic pain, she will continue her Percocet  10-325 mg every 4-6 hours as needed by her pain management physician.  She has done an excellent job at working on weight loss and improving her glucose control, her A1c was at goal.  She will continue her Ozempic  2 mg once weekly and work on healthy lifestyle measures to bring down her BMI.  We will follow-up in 2 weeks to reevaluate the left knee, consider ultrasound-guided injection if required.  Follow-up: Return in about 2 weeks for f/u b/l knees  Meds & Orders: No orders of the defined types were placed in this encounter.   Orders Placed This Encounter  Procedures   US  Guided Needle Placement - No Linked Charges     Procedures: Large Joint Inj: R knee on 07/08/2023 5:30 PM Indications: pain and joint swelling Details: 22 G 1.5 in and 3.5 in needle, ultrasound-guided superolateral approach Medications: 2 mL lidocaine  1 %; 2 mL bupivacaine  0.25 %; 40 mg methylPREDNISolone  acetate 40 MG/ML Outcome: tolerated well, no immediate complications  Procedure: Ultrasound-guided Knee Injection, Right After discussion on risk/benefits/indication, informed verbal consent was obtained. A timeout was then performed. The patient was lying in supine on examination table with a small bolster underneath the affected knee for comfort. The patient's knee was prepped with Betadine and alcohol swabs. Utilizing ultrasound-guidance with the probe in a transverse position, the patient's suprapatellar bursa was identified and the knee joint was subsequently injected intraarticularly with a mixture of 2:2:1 lidocaine :bupivicaine:methylprednisolone  utilizing an in-plane visualization approach. Patient tolerated the procedure well without immediate  complications.  Procedure, treatment alternatives, risks and benefits explained, specific risks discussed. Consent was given by the patient. Patient was prepped and draped in the usual sterile fashion.          Clinical History: No specialty comments available.  She  reports that she quit smoking about 3 years ago. Her smoking use included cigarettes. She has never used smokeless tobacco.  Recent Labs    02/14/23 1617 05/19/23 1546  HGBA1C 5.9* 5.8    Objective:   Vital Signs: LMP 02/13/2014   Physical Exam  Gen: Well-appearing, in no acute distress; non-toxic CV: Well-perfused. Warm.  Resp: Breathing unlabored on room air; no wheezing. Psych: Fluid speech in conversation; appropriate affect; normal thought process  Ortho Exam - Bilateral knees: Positive TTP over the medial and lateral joint line of the right knee greater than left knee.  No significant effusions of either knee.  Patient has limited mobility, more so ambulates in a wheelchair.  Imaging: No results found.  Past Medical/Family/Surgical/Social History: Medications & Allergies reviewed per EMR, new medications updated. Patient Active Problem List   Diagnosis Date Noted   Class 3 severe obesity due to excess calories with body mass index (BMI) greater than or equal to 70 in adult [facility]) 02/20/2023   Chronic pain of both knees 11/08/2022   Osteoarthritis of both knees    Influenza A 05/17/2021   Flu 05/16/2021   Acute on chronic respiratory failure with hypoxia and hypercapnia (HCC) 04/17/2021   Abnormal CT of brain 04/17/2021   Polycythemia 04/17/2021   Chronic back pain 04/17/2021   Leukocytosis 04/17/2021   Episode of unresponsiveness 04/17/2021   Acute respiratory failure with hypoxia (HCC) 10/02/2020   Chronic diastolic CHF (congestive heart failure) (HCC) 04/29/2020   Essential hypertension 04/25/2020   Community acquired bacterial pneumonia 04/25/2020   Class 3 obesity 04/25/2020   Diabetes mellitus due to underlying condition with diabetic autonomic neuropathy, unspecified whether long term insulin  use (HCC) 04/24/2020   Acute respiratory failure (HCC) 04/24/2020   Asthmatic bronchitis 03/21/2020   Nocturnal hypoxia 03/21/2020   OSA (obstructive sleep apnea)  12/22/2019   Shortness of breath 08/09/2019   No-show for appointment 06/30/2019   Morbid obesity with BMI of 60.0-69.9, adult (HCC) 09/14/2014   Iron deficiency anemia due to chronic blood loss 09/14/2014   Constipation 09/14/2014   Postoperative state 04/12/2014   Uterine fibroid 03/17/2014   DUB (dysfunctional uterine bleeding) 01/27/2014   Iron deficiency anemia due to chronic blood loss 04/09/2013   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   Menometrorrhagia 04/09/2013   Left hand pain 09/18/2011   High blood pressure    ANEMIA, IRON DEFICIENCY 08/06/2010   CHOLECYSTECTOMY, HX OF 04/09/2010   Dyslipidemia 02/13/2010   DEGENERATIVE DISC DISEASE, LUMBOSACRAL SPINE W/RADICULOPATHY 11/01/2009   BACK PAIN, LUMBAR, WITH RADICULOPATHY 10/30/2009   TRIGGER FINGER 02/03/2009   Carpal tunnel syndrome 12/21/2008   Headache(784.0) 11/09/2008   Depression 10/04/2008   URI 08/24/2008   INSOMNIA 08/24/2008   Asthma 06/06/2008   Morbid obesity (HCC) 05/24/2008   ESSENTIAL HYPERTENSION 05/24/2008   Past Medical History:  Diagnosis Date   Anemia    iv iron treatment dec 2014/ see oncology for this last in dec 2014   Anxiety    Arthritis    knee, hands   Asthma    seasonal related/ albuterol  used when uri   CHF (congestive heart failure) (HCC)    CN (constipation) 09/14/2014   DEGENERATIVE DISC DISEASE, LUMBOSACRAL SPINE W/RADICULOPATHY 11/01/2009  Qualifier: Diagnosis of  By: Lelon RIGGERS, Scott     Depression    Diabetes (HCC)    H/O dizziness    Hx   Headache(784.0)    otc med prn   High blood pressure    HYPERLIPIDEMIA 02/13/2010   Qualifier: Diagnosis of  By: Lelon RIGGERS, Scott     INSOMNIA 08/24/2008   Qualifier: Diagnosis of  By: Loretha MD, Victoria     Morbid obesity (HCC) 05/24/2008   Qualifier: Diagnosis of  By: Loretha MD, Victoria     SVD (spontaneous vaginal delivery)    x 3   Family History  Problem Relation Age of Onset   Diabetes Mother    Hypertension Father     Heart disease Other    Diabetes Other    Hypertension Other    Alcohol abuse Other    Hypertension Brother    Asthma Brother    Hypertension Brother    Asthma Brother    COPD Maternal Grandmother    Lung cancer Maternal Grandmother    Asthma Paternal Grandmother    Asthma Paternal Aunt    Past Surgical History:  Procedure Laterality Date   ABDOMINAL HYSTERECTOMY  03/13/2014   total    BILATERAL SALPINGECTOMY Bilateral 04/12/2014   Procedure: BILATERAL SALPINGECTOMY;  Surgeon: Lynwood KANDICE Solomons, MD;  Location: [facility];  Service: Gynecology;  Laterality: Bilateral;   CARPAL TUNNEL RELEASE  08/30/2011   Procedure: CARPAL TUNNEL RELEASE;  Surgeon: Arley JONELLE Curia, MD;  Location: [facility];  Service: Orthopedics;  Laterality: Right;   CHOLECYSTECTOMY  10/11   lap choli   GALLBLADDER SURGERY  04/04/2010   HYSTEROSCOPY N/A 02/08/2014   Procedure: HYSTEROSCOPY;  Surgeon: Lynwood KANDICE Solomons, MD;  Location: [facility];  Service: Gynecology;  Laterality: N/A;   HYSTEROSCOPY WITH NOVASURE N/A 02/08/2014   Procedure: ATTEMPTED NOVASURE ABLATION;  Surgeon: Lynwood KANDICE Solomons, MD;  Location: [facility];  Service: Gynecology;  Laterality: N/A;   INTRAUTERINE DEVICE INSERTION     07/19/2013   SEPTOPLASTY N/A 09/08/2013   Procedure: SEPTOPLASTY;  Surgeon: Merilee Kraft, MD;  Location: [facility];  Service: ENT;  Laterality: N/A;   SPINE SURGERY     TONSILLECTOMY     TONSILLECTOMY AND ADENOIDECTOMY Bilateral 09/08/2013   Procedure: TONSILLECTOMY ;  Surgeon: Merilee Kraft, MD;  Location: [facility] OR;  Service: ENT;  Laterality: Bilateral;   TUBAL LIGATION     VAGINAL HYSTERECTOMY N/A 04/12/2014   Procedure: LAPAROSCOPIC ASSISTED VAGINAL HYSTERECTOMY;  Surgeon: Lynwood KANDICE Solomons, MD;  Location: [facility];  Service: Gynecology;  Laterality: N/A;   WISDOM TOOTH EXTRACTION     Social History   Occupational History   Not on file  Tobacco Use   Smoking status: Former    Current packs/day: 0.00    Types: Cigarettes    Quit  date: 04/24/2020    Years since quitting: 3.2   Smokeless tobacco: Never   Tobacco comments:    4-5 cigarettes a day  Vaping Use   Vaping status: Never Used  Substance and Sexual Activity   Alcohol use: No   Drug use: No   Sexual activity: Not on file

## 2023-07-08 NOTE — Progress Notes (Signed)
 Patient says that her knee pain has returned again in the last 2.5 weeks. She says that the right remains worse than the left. She walks slowly as she has sharp pain and does worry that she will fall.

## 2023-07-10 ENCOUNTER — Other Ambulatory Visit: Payer: Self-pay

## 2023-07-22 ENCOUNTER — Encounter: Payer: Self-pay | Admitting: Sports Medicine

## 2023-07-22 ENCOUNTER — Ambulatory Visit: Payer: MEDICAID | Admitting: Sports Medicine

## 2023-07-22 ENCOUNTER — Other Ambulatory Visit: Payer: Self-pay

## 2023-07-22 DIAGNOSIS — M170 Bilateral primary osteoarthritis of knee: Secondary | ICD-10-CM | POA: Diagnosis not present

## 2023-07-22 DIAGNOSIS — G8929 Other chronic pain: Secondary | ICD-10-CM

## 2023-07-22 DIAGNOSIS — M25562 Pain in left knee: Secondary | ICD-10-CM

## 2023-07-22 MED ORDER — METHYLPREDNISOLONE ACETATE 40 MG/ML IJ SUSP
40.0000 mg | INTRAMUSCULAR | Status: AC | PRN
  Administered 2023-07-22: 40 mg via INTRA_ARTICULAR

## 2023-07-22 MED ORDER — BUPIVACAINE HCL 0.25 % IJ SOLN
2.0000 mL | INTRAMUSCULAR | Status: AC | PRN
  Administered 2023-07-22: 2 mL via INTRA_ARTICULAR

## 2023-07-22 MED ORDER — LIDOCAINE HCL 1 % IJ SOLN
2.0000 mL | INTRAMUSCULAR | Status: AC | PRN
  Administered 2023-07-22: 2 mL

## 2023-07-22 NOTE — Progress Notes (Signed)
Cynthia Becker - 53 y.o. female MRN [id number]  Date of birth: July 19, 1970  Office Visit Note: Visit Date: 07/22/2023 PCP: Ivonne Andrew, NP Referred by: Ivonne Andrew, NP  Subjective: Chief Complaint  Patient presents with   Left Knee - Pain   HPI: Cynthia Becker is a pleasant 53 y.o. female who presents today for follow-up of bilateral knees with known advanced osteoarthritis.  She continues working on healthy weight loss, she has been doing chair exercises and straight leg raises from a seated position to help offload the knees.  She is managed on Ozempic 2 mg once weekly, she has last 5 to 6 pounds even since her last visit a few weeks ago.  Is interested in gastric leave, discussing having a referral from her primary.  The right knee is doing better from the injection, still gets some crepitus/popping however.  Her left knee is most bothersome, she is interested in proceeding with injection today.  She has been thinking about the aquatic-based physical therapy, still not quite ready to proceed with this but will keep me updated.  Medicine/Treatment: Ibuprofen 800 mg 3 times daily as needed, chronic Percocet 10-325 mg every 4 hours as needed.    She is a type-II diabetic, but has been improving her glucose control, she is managed on Ozempic 2 mg IM once weekly.   Pertinent ROS were reviewed with the patient and found to be negative unless otherwise specified above in HPI.   Assessment & Plan: Visit Diagnoses:  1. Chronic pain of left knee   2. Bilateral primary osteoarthritis of knee    Plan: Impression is bilateral advanced osteoarthritis of each knee, her left knee pain is symptomatic today for her.  Through shared decision-making, we did proceed with ultrasound-guided left intra-articular knee injection.  Advised on 48 hours of modified rest/activity following this.  She can use ice or over-the-counter anti-inflammatories as needed.  For her chronic pain, she will continue her  Percocet 10-325 mg every 4-6 hours as needed by her pain management physician.  I did commend her on her continued weight loss, she will continue her Ozempic 2 mg once weekly and work on her healthy lifestyle measures to help bring down her BMI.  She will continue her home exercises for the knees/quads, she will let me know if she is interested in aquatic-based physical therapy, she may call or message me.  We will follow-up in about 3 months to reevaluate the knees.  Follow-up: Return in about 2 months (around 10/06/2023), or if symptoms worsen or fail to improve, for bilateral knees.   Meds & Orders: No orders of the defined types were placed in this encounter.   Orders Placed This Encounter  Procedures   Large Joint Inj   US Guided Needle Placement - No Linked Charges     Procedures: Large Joint Inj: L knee on 07/22/2023 3:25 PM Indications: pain Details: 22 G 3.5 in needle, ultrasound-guided superolateral approach Medications: 2 mL lidocaine 1 %; 2 mL bupivacaine 0.25 %; 40 mg methylPREDNISolone acetate 40 MG/ML Outcome: tolerated well, no immediate complications  Procedure: Ultrasound-guided Knee Injection, Left After discussion on risk/benefits/indication, informed verbal consent was obtained. A timeout was then performed. The patient was lying in supine on examination table with a small bolster underneath the affected knee for comfort. The patient's knee was prepped with Betadine and alcohol swabs. Utilizing ultrasound-guidance with the probe in a transverse position, the patient's suprapatellar bursa was identified and the knee joint was  subsequently injected intraarticularly with a mixture of 2:2:1 lidocaine:bupivicaine:methylprednisolone utilizing an in-plane visualization approach. Patient tolerated the procedure well without immediate complications.  Procedure, treatment alternatives, risks and benefits explained, specific risks discussed. Consent was given by the patient. Patient was  prepped and draped in the usual sterile fashion.          Clinical History: No specialty comments available.  She reports that she quit smoking about 3 years ago. Her smoking use included cigarettes. She has never used smokeless tobacco.  Recent Labs    02/14/23 1617 05/19/23 1546  HGBA1C 5.9* 5.8    Objective:   Vital Signs: LMP 02/13/2014   Physical Exam  Gen: Well-appearing, in no acute distress; non-toxic CV: Well-perfused. Warm.  Resp: Breathing unlabored on room air; no wheezing. Psych: Fluid speech in conversation; appropriate affect; normal thought process  Ortho Exam - Left knee: No significant effusion of the knee.  Range of motion from 0-115 degrees.  Patient ambulates more so in a wheelchair.  Imaging: No results found.  Past Medical/Family/Surgical/Social History: Medications & Allergies reviewed per EMR, new medications updated. Patient Active Problem List   Diagnosis Date Noted   Class 3 severe obesity due to excess calories with body mass index (BMI) greater than or equal to 70 in adult [facility]) 02/20/2023   Chronic pain of both knees 11/08/2022   Osteoarthritis of both knees    Influenza A 05/17/2021   Flu 05/16/2021   Acute on chronic respiratory failure with hypoxia and hypercapnia (HCC) 04/17/2021   Abnormal CT of brain 04/17/2021   Polycythemia 04/17/2021   Chronic back pain 04/17/2021   Leukocytosis 04/17/2021   Episode of unresponsiveness 04/17/2021   Acute respiratory failure with hypoxia (HCC) 10/02/2020   Chronic diastolic CHF (congestive heart failure) (HCC) 04/29/2020   Essential hypertension 04/25/2020   Community acquired bacterial pneumonia 04/25/2020   Class 3 obesity 04/25/2020   Diabetes mellitus due to underlying condition with diabetic autonomic neuropathy, unspecified whether long term insulin use (HCC) 04/24/2020   Acute respiratory failure (HCC) 04/24/2020   Asthmatic bronchitis 03/21/2020   Nocturnal hypoxia 03/21/2020   OSA  (obstructive sleep apnea) 12/22/2019   Shortness of breath 08/09/2019   No-show for appointment 06/30/2019   Morbid obesity with BMI of 60.0-69.9, adult (HCC) 09/14/2014   Iron deficiency anemia due to chronic blood loss 09/14/2014   Constipation 09/14/2014   Postoperative state 04/12/2014   Uterine fibroid 03/17/2014   DUB (dysfunctional uterine bleeding) 01/27/2014   Iron deficiency anemia due to chronic blood loss 04/09/2013   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   Menometrorrhagia 04/09/2013   Left hand pain 09/18/2011   High blood pressure    ANEMIA, IRON DEFICIENCY 08/06/2010   CHOLECYSTECTOMY, HX OF 04/09/2010   Dyslipidemia 02/13/2010   DEGENERATIVE DISC DISEASE, LUMBOSACRAL SPINE W/RADICULOPATHY 11/01/2009   BACK PAIN, LUMBAR, WITH RADICULOPATHY 10/30/2009   TRIGGER FINGER 02/03/2009   Carpal tunnel syndrome 12/21/2008   Headache(784.0) 11/09/2008   Depression 10/04/2008   URI 08/24/2008   INSOMNIA 08/24/2008   Asthma 06/06/2008   Morbid obesity (HCC) 05/24/2008   ESSENTIAL HYPERTENSION 05/24/2008   Past Medical History:  Diagnosis Date   Anemia    iv iron treatment dec 2014/ see oncology for this last in dec 2014   Anxiety    Arthritis    knee, hands   Asthma    seasonal related/ albuterol used when uri   CHF (congestive heart failure) (HCC)    CN (constipation) 09/14/2014  DEGENERATIVE DISC DISEASE, LUMBOSACRAL SPINE W/RADICULOPATHY 11/01/2009   Qualifier: Diagnosis of  By: Huntley Dec, Scott     Depression    Diabetes (HCC)    H/O dizziness    Hx   Headache(784.0)    otc med prn   High blood pressure    HYPERLIPIDEMIA 02/13/2010   Qualifier: Diagnosis of  By: Huntley Dec, Scott     INSOMNIA 08/24/2008   Qualifier: Diagnosis of  By: Barbaraann Barthel MD, [place]     Morbid obesity (HCC) 05/24/2008   Qualifier: Diagnosis of  By: Barbaraann Barthel MD, [place]     SVD (spontaneous vaginal delivery)    x 3   Family History  Problem Relation Age of Onset   Diabetes Mother     Hypertension Father    Heart disease Other    Diabetes Other    Hypertension Other    Alcohol abuse Other    Hypertension Brother    Asthma Brother    Hypertension Brother    Asthma Brother    COPD Maternal Grandmother    Lung cancer Maternal Grandmother    Asthma Paternal Grandmother    Asthma Paternal Aunt    Past Surgical History:  Procedure Laterality Date   ABDOMINAL HYSTERECTOMY  03/13/2014   total    BILATERAL SALPINGECTOMY Bilateral 04/12/2014   Procedure: BILATERAL SALPINGECTOMY;  Surgeon: Adam Phenix, MD;  Location: [facility];  Service: Gynecology;  Laterality: Bilateral;   CARPAL TUNNEL RELEASE  08/30/2011   Procedure: CARPAL TUNNEL RELEASE;  Surgeon: Nicki Reaper, MD;  Location: [facility];  Service: Orthopedics;  Laterality: Right;   CHOLECYSTECTOMY  10/11   lap choli   GALLBLADDER SURGERY  04/04/2010   HYSTEROSCOPY N/A 02/08/2014   Procedure: HYSTEROSCOPY;  Surgeon: Adam Phenix, MD;  Location: [facility];  Service: Gynecology;  Laterality: N/A;   HYSTEROSCOPY WITH NOVASURE N/A 02/08/2014   Procedure: ATTEMPTED NOVASURE ABLATION;  Surgeon: Adam Phenix, MD;  Location: [facility];  Service: Gynecology;  Laterality: N/A;   INTRAUTERINE DEVICE INSERTION     07/19/2013   SEPTOPLASTY N/A 09/08/2013   Procedure: SEPTOPLASTY;  Surgeon: Melvenia Beam, MD;  Location[facility];  Service: ENT;  Laterality: N/A;   SPINE SURGERY     TONSILLECTOMY     TONSILLECTOMY AND ADENOIDECTOMY Bilateral 09/08/2013   Procedure: TONSILLECTOMY ;  Surgeon: Melvenia Beam, MD;  Location: [facility] OR;  Service: ENT;  Laterality: Bilateral;   TUBAL LIGATION     VAGINAL HYSTERECTOMY N/A 04/12/2014   Procedure: LAPAROSCOPIC ASSISTED VAGINAL HYSTERECTOMY;  Surgeon: Adam Phenix, MD;  Location: [facility];  Service: Gynecology;  Laterality: N/A;   WISDOM TOOTH EXTRACTION     Social History   Occupational History   Not on file  Tobacco Use   Smoking status: Former    Current packs/day: 0.00     Types: Cigarettes    Quit date: 04/24/2020    Years since quitting: 3.2   Smokeless tobacco: Never   Tobacco comments:    4-5 cigarettes a day  Vaping Use   Vaping status: Never Used  Substance and Sexual Activity   Alcohol use: No   Drug use: No   Sexual activity: Not on file

## 2023-07-22 NOTE — Progress Notes (Signed)
Patient says that the right knee seems to be overall better, although she does still have some discomfort and popping.

## 2023-07-24 ENCOUNTER — Encounter: Payer: Self-pay | Admitting: Nurse Practitioner

## 2023-07-24 ENCOUNTER — Ambulatory Visit: Payer: MEDICAID | Admitting: Nurse Practitioner

## 2023-07-24 ENCOUNTER — Encounter: Payer: Self-pay | Admitting: Oncology

## 2023-07-24 VITALS — BP 106/74 | HR 89 | Temp 97.0°F | Wt >= 6400 oz

## 2023-07-24 DIAGNOSIS — M79671 Pain in right foot: Secondary | ICD-10-CM | POA: Diagnosis not present

## 2023-07-24 MED ORDER — LANCET DEVICE MISC: 1.0000 | Freq: Two times a day (BID) | 0 refills | Status: AC

## 2023-07-24 MED ORDER — BLOOD GLUCOSE TEST VI STRP: 1.0000 | ORAL_STRIP | Freq: Two times a day (BID) | 0 refills | Status: DC

## 2023-07-24 MED ORDER — LANCETS MISC. MISC: 1.0000 | Freq: Two times a day (BID) | 0 refills | Status: AC

## 2023-07-24 MED ORDER — BLOOD GLUCOSE MONITORING SUPPL DEVI: 1.0000 | Freq: Two times a day (BID) | 0 refills | Status: AC

## 2023-07-24 NOTE — Progress Notes (Signed)
Subjective   Patient ID: Cynthia Becker, female    DOB: 17-Jun-1971, 53 y.o.   MRN: [id number]  Chief Complaint  Patient presents with   Foot Swelling    With pain feels better    Referring provider: Ivonne Andrew, NP  Cynthia Becker is a 53 y.o. female with Past Medical History: No date: Anemia     Comment:  iv iron treatment dec 2014/ see oncology for this last               in dec 2014 No date: Anxiety No date: Arthritis     Comment:  knee, hands No date: Asthma     Comment:  seasonal related/ albuterol used when uri No date: CHF (congestive heart failure) (HCC) 09/14/2014: CN (constipation) 11/01/2009: DEGENERATIVE DISC DISEASE, LUMBOSACRAL SPINE W/ RADICULOPATHY     Comment:  Qualifier: Diagnosis of  By: Huntley Dec, Scott   No date: Depression No date: Diabetes (HCC) No date: H/O dizziness     Comment:  Hx No date: Headache(784.0)     Comment:  otc med prn No date: High blood pressure 02/13/2010: HYPERLIPIDEMIA     Comment:  Qualifier: Diagnosis of  By: Huntley Dec, Scott   08/24/2008: INSOMNIA     Comment:  Qualifier: Diagnosis of  By: Barbaraann Barthel MD, Benetta Spar   05/24/2008: Morbid obesity (HCC)     Comment:  Qualifier: Diagnosis of  By: Barbaraann Barthel MD, [place]   No date: SVD (spontaneous vaginal delivery)     Comment:  x 3   HPI  Patient presents today for follow-up and for foot pain.  She states that for the past 3 weeks she has been having right foot pain.  It has gradually resolved and she is not having pain today.  She does continue on furosemide and Aldactone.  We will place a referral to podiatry for her today. Denies f/c/s, n/v/d, hemoptysis, PND, leg swelling Denies chest pain or edema       No Known Allergies  Immunization History  Administered Date(s) Administered   Influenza Whole 04/25/2010   Influenza,inj,Quad PF,6+ Mos 03/07/2014, 03/10/2019   Pneumococcal Polysaccharide-23 04/13/2014   Td 08/02/2010    Tobacco History: Social History    Tobacco Use  Smoking Status Former   Current packs/day: 0.00   Types: Cigarettes   Quit date: 04/24/2020   Years since quitting: 3.2  Smokeless Tobacco Never  Tobacco Comments   4-5 cigarettes a day   Counseling given: Not Answered Tobacco comments: 4-5 cigarettes a day   Outpatient Encounter Medications as of 07/24/2023  Medication Sig   ACCU-CHEK FASTCLIX LANCETS MISC USE TO CHECK BLOOD SUGAR TWICE DAILY   ACCU-CHEK GUIDE test strip USE 1 STRIP TO CHECK BLOOD SUGAR THREE TIMES DAILY   albuterol (PROVENTIL) (2.5 MG/3ML) 0.083% nebulizer solution USE 1 VIAL IN NEBULIZER EVERY 6 HOURS AS NEEDED FOR WHEEZING OR SHORTNESS OF BREATH   Blood Glucose Monitoring Suppl DEVI 1 each by Does not apply route 2 (two) times daily. May substitute to any manufacturer covered by patient's insurance.   cyclobenzaprine (FLEXERIL) 5 MG tablet Take 1 tablet (5 mg total) by mouth 3 (three) times daily as needed. for muscle spams   ENTRESTO 49-51 MG TAKE 1 TABLET BY MOUTH TWICE DAILY **  APPOINTMENT  REQUIRED  FOR  FUTURE  REFILLS**   EQ ALLERGY RELIEF, CETIRIZINE, 10 MG tablet Take 1 tablet by mouth once daily   gabapentin (NEURONTIN) 300 MG capsule Take 3 capsules (900 mg  total) by mouth 3 (three) times daily.   Glucose Blood (BLOOD GLUCOSE TEST STRIPS) STRP 1 each by In Vitro route 2 (two) times daily. May substitute to any manufacturer covered by patient's insurance.   ibuprofen (ADVIL) 800 MG tablet Take 800 mg by mouth every 8 (eight) hours as needed for moderate pain.   Lancet Device MISC 1 each by Does not apply route 2 (two) times daily. May substitute to any manufacturer covered by patient's insurance.   Lancets Misc. MISC 1 each by Does not apply route in the morning and at bedtime. May substitute to any manufacturer covered by patient's insurance.   LINZESS 290 MCG CAPS capsule TAKE 1 CAPSULE BY MOUTH ONCE DAILY BEFORE BREAKFAST   naloxone (NARCAN) nasal spray 4 mg/0.1 mL Place 1 spray into the  nose as needed.   oxyCODONE-acetaminophen (PERCOCET) 10-325 MG tablet Take 1 tablet by mouth every 4 (four) hours as needed for pain.   potassium chloride SA (KLOR-CON M) 20 MEQ tablet Take 1 tablet (20 mEq total) by mouth daily.   promethazine (PHENERGAN) 25 MG tablet Take 25 mg by mouth every 8 (eight) hours as needed for nausea or vomiting.   Semaglutide, 2 MG/DOSE, (OZEMPIC, 2 MG/DOSE,) 8 MG/3ML SOPN Inject 2 mg into the skin once a week.   torsemide (DEMADEX) 20 MG tablet Take 3 tablets (60 mg total) by mouth 2 (two) times daily.   blood glucose meter kit and supplies KIT Dispense based on patient and insurance preference. Use up to four times daily as directed. (Patient not taking: Reported on 07/24/2023)   omeprazole (PRILOSEC) 20 MG capsule Take 1 capsule (20 mg total) by mouth at bedtime. (Patient not taking: Reported on 07/24/2023)   spironolactone (ALDACTONE) 25 MG tablet Take 1 tablet (25 mg total) by mouth daily. (Patient not taking: Reported on 07/24/2023)   No facility-administered encounter medications on file as of 07/24/2023.    Review of Systems  Review of Systems  Constitutional: Negative.   HENT: Negative.    Cardiovascular: Negative.   Gastrointestinal: Negative.   Allergic/Immunologic: Negative.   Neurological: Negative.   Psychiatric/Behavioral: Negative.       Objective:   BP 106/74   Pulse 89   Temp (!) 97 F (36.1 C)   Wt (!) 416 lb (188.7 kg)   LMP 02/13/2014   SpO2 95%   BMI 69.23 kg/m   Wt Readings from Last 5 Encounters:  07/24/23 (!) 416 lb (188.7 kg)  05/19/23 (!) 436 lb (197.8 kg)  05/01/23 (!) 424 lb 6.4 oz (192.5 kg)  02/14/23 (!) 436 lb (197.8 kg)  11/08/22 (!) 440 lb (199.6 kg)     Physical Exam Vitals and nursing note reviewed.  Constitutional:      General: She is not in acute distress.    Appearance: She is well-developed.  Cardiovascular:     Rate and Rhythm: Normal rate and regular rhythm.  Pulmonary:     Effort: Pulmonary  effort is normal.     Breath sounds: Normal breath sounds.  Neurological:     Mental Status: She is alert and oriented to person, place, and time.       Assessment & Plan:   Right foot pain -     Ambulatory referral to Podiatry -     Uric acid  Other orders -     Blood Glucose Monitoring Suppl; 1 each by Does not apply route 2 (two) times daily. May substitute to any manufacturer covered by  patient's insurance.  Dispense: 1 each; Refill: 0 -     Blood Glucose Test; 1 each by In Vitro route 2 (two) times daily. May substitute to any manufacturer covered by patient's insurance.  Dispense: 100 each; Refill: 0 -     Lancet Device; 1 each by Does not apply route 2 (two) times daily. May substitute to any manufacturer covered by patient's insurance.  Dispense: 1 each; Refill: 0 -     Lancets Misc.; 1 each by Does not apply route in the morning and at bedtime. May substitute to any manufacturer covered by patient's insurance.  Dispense: 200 each; Refill: 0     No follow-ups on file.   Ivonne Andrew, NP 07/24/2023

## 2023-07-24 NOTE — Patient Instructions (Signed)
Gout  Gout is painful swelling of your joints. Gout is a type of arthritis. It is caused by having too much uric acid in your body. Uric acid is a chemical that is made when your body breaks down substances called purines. If your body has too much uric acid, sharp crystals can form and build up in your joints. This causes pain and swelling. Gout attacks can happen quickly and be very painful (acute gout). Over time, the attacks can affect more joints and happen more often (chronic gout). What are the causes? Gout is caused by too much uric acid in your blood. This can happen because: Your kidneys do not remove enough uric acid from your blood. Your body makes too much uric acid. You eat too many foods that are high in purines. These foods include organ meats, some seafood, and beer. Trauma or stress can bring on an attack. What increases the risk? Having a family history of gout. Being female and middle-aged. Being female and having gone through menopause. Having an organ transplant. Taking certain medicines. Having certain conditions, such as: Being very overweight (obese). Lead poisoning. Kidney disease. A skin condition called psoriasis. Other risks include: Losing weight too quickly. Not having enough water in the body (being dehydrated). Drinking alcohol, especially beer. Drinking beverages that are sweetened with a type of sugar called fructose. What are the signs or symptoms? An attack of acute gout often starts at night and usually happens in just one joint. The most common place is the big toe. Other joints that may be affected include joints of the feet, ankle, knee, fingers, wrist, or elbow. Symptoms may include: Very bad pain. Warmth. Swelling. Stiffness. Tenderness. The affected joint may be very painful to touch. Shiny, red, or purple skin. Chills and fever. Chronic gout may cause symptoms more often. More joints may be involved. You may also have white or yellow lumps  (tophi) on your hands or feet or in other areas near your joints. How is this treated? Treatment for an acute attack may include medicines for pain and swelling, such as: NSAIDs, such as ibuprofen. Steroids taken by mouth or injected into a joint. Colchicine. This can be given by mouth or through an IV tube. Treatment to prevent future attacks may include: Taking small doses of NSAIDs or colchicine daily. Using a medicine that reduces uric acid levels in your blood, such as allopurinol. Making changes to your diet. You may need to see a food expert (dietitian) about what to eat and drink to prevent gout. Follow these instructions at home: During a gout attack  If told, put ice on the painful area. To do this: Put ice in a plastic bag. Place a towel between your skin and the bag. Leave the ice on for 20 minutes, 2-3 times a day. Take off the ice if your skin turns bright red. This is very important. If you cannot feel pain, heat, or cold, you have a greater risk of damage to the area. Raise the painful joint above the level of your heart as often as you can. Rest the joint as much as possible. If the joint is in your leg, you may be given crutches. Follow instructions from your doctor about what you cannot eat or drink. Avoiding future gout attacks Eat a low-purine diet. Avoid foods and drinks such as: Liver. Kidney. Anchovies. Asparagus. Herring. Mushrooms. Mussels. Beer. Stay at a healthy weight. If you want to lose weight, talk with your doctor. Do not  lose weight too fast. Start or continue an exercise plan as told by your doctor. Eating and drinking Avoid drinks sweetened by fructose. Drink enough fluids to keep your pee (urine) pale yellow. If you drink alcohol: Limit how much you have to: 0-1 drink a day for women who are not pregnant. 0-2 drinks a day for men. Know how much alcohol is in a drink. In the [place] one drink equals one 12 oz bottle of beer (355 mL), one 5 oz  glass of wine (148 mL), or one 1 oz glass of hard liquor (44 mL). General instructions Take over-the-counter and prescription medicines only as told by your doctor. Ask your doctor if you should avoid driving or using machines while you are taking your medicine. Return to your normal activities when your doctor says that it is safe. Keep all follow-up visits. Where to find more information [employer]: www.niams.http://www.myers.net/ Contact a doctor if: You have another gout attack. You still have symptoms of a gout attack after 10 days of treatment. You have problems (side effects) because of your medicines. You have chills or a fever. You have burning pain when you pee (urinate). You have pain in your lower back or belly. Get help right away if: You have very bad pain. Your pain cannot be controlled. You cannot pee. Summary Gout is painful swelling of the joints. The most common site of pain is the big toe, but it can affect other joints. Medicines and avoiding some foods can help to prevent and treat gout attacks. This information is not intended to replace advice given to you by your health care provider. Make sure you discuss any questions you have with your health care provider. Document Revised: 03/14/2021 Document Reviewed: 03/14/2021 Elsevier Patient Education  2024 [employer].

## 2023-07-25 ENCOUNTER — Other Ambulatory Visit: Payer: Self-pay | Admitting: Nurse Practitioner

## 2023-07-25 DIAGNOSIS — M109 Gout, unspecified: Secondary | ICD-10-CM

## 2023-07-25 LAB — URIC ACID: Uric Acid: 11 mg/dL — ABNORMAL HIGH (ref 3.0–7.2)

## 2023-07-25 MED ORDER — ALLOPURINOL 100 MG PO TABS: 100.0000 mg | ORAL_TABLET | Freq: Every day | ORAL | 2 refills | Status: DC

## 2023-07-25 MED ORDER — COLCHICINE 0.6 MG PO TABS: 0.6000 mg | ORAL_TABLET | Freq: Every day | ORAL | 0 refills | Status: DC

## 2023-08-04 ENCOUNTER — Ambulatory Visit (INDEPENDENT_AMBULATORY_CARE_PROVIDER_SITE_OTHER): Payer: MEDICAID | Admitting: Podiatry

## 2023-08-04 ENCOUNTER — Encounter: Payer: Self-pay | Admitting: Podiatry

## 2023-08-04 DIAGNOSIS — B351 Tinea unguium: Secondary | ICD-10-CM

## 2023-08-04 DIAGNOSIS — M79674 Pain in right toe(s): Secondary | ICD-10-CM

## 2023-08-04 DIAGNOSIS — M79675 Pain in left toe(s): Secondary | ICD-10-CM | POA: Diagnosis not present

## 2023-08-04 DIAGNOSIS — E119 Type 2 diabetes mellitus without complications: Secondary | ICD-10-CM | POA: Diagnosis not present

## 2023-08-04 NOTE — Progress Notes (Signed)
   Chief Complaint  Patient presents with   Diabetes    Patient states she is diabetic and that her right hallux is curved in a bit , her 2nd and 3rd toe nails are to thick to cut, the right hallux is lifting.  Patient states they have been like this for maybe 2 years , patient believes she may have toe nail fungus     SUBJECTIVE Patient with a history of diabetes mellitus presents to office today complaining of elongated, thickened nails that cause pain while ambulating in shoes.  Patient is unable to trim their own nails. Patient is here for further evaluation and treatment.  Past Medical History:  Diagnosis Date   Anemia    iv iron treatment dec 2014/ see oncology for this last in dec 2014   Anxiety    Arthritis    knee, hands   Asthma    seasonal related/ albuterol  used when uri   CHF (congestive heart failure) (HCC)    CN (constipation) 09/14/2014   DEGENERATIVE DISC DISEASE, LUMBOSACRAL SPINE W/RADICULOPATHY 11/01/2009   Qualifier: Diagnosis of  By: Earle Glatter, Scott     Depression    Diabetes (HCC)    H/O dizziness    Hx   Headache(784.0)    otc med prn   High blood pressure    HYPERLIPIDEMIA 02/13/2010   Qualifier: Diagnosis of  By: Earle Glatter, Scott     INSOMNIA 08/24/2008   Qualifier: Diagnosis of  By: Katheleen Palmer MD, [place]     Morbid obesity (HCC) 05/24/2008   Qualifier: Diagnosis of  By: Katheleen Palmer MD, [place]     SVD (spontaneous vaginal delivery)    x 3    No Known Allergies   OBJECTIVE General Patient is awake, alert, and oriented x 3 and in no acute distress. Derm Skin is dry and supple bilateral. Negative open lesions or macerations. Remaining integument unremarkable. Nails are tender, long, thickened and dystrophic with subungual debris, consistent with onychomycosis, 1-5 bilateral. No signs of infection noted. Vasc  DP and PT pedal pulses palpable bilaterally. Temperature gradient within normal limits.  Neuro Light touch and protective threshold sensation  diminished bilaterally.  Musculoskeletal Exam No symptomatic pedal deformities noted bilateral. Muscular strength within normal limits. Minimally ambulatory in an electric wheelchair  ASSESSMENT 1. Diabetes Mellitus w/ peripheral neuropathy 2.  Pain due to onychomycosis of toenails bilateral  PLAN OF CARE 1. Patient evaluated today.  Comprehensive diabetic foot exam performed today 2. Instructed to maintain good pedal hygiene and foot care. Stressed importance of controlling blood sugar.  3. Mechanical debridement of nails 1-5 bilaterally performed using a nail nipper. Filed with dremel without incident.  4. Return to clinic in 3 mos.     Dot Gazella, DPM [facility]  Dr. Dot Gazella, DPM    [address] [place], [place] [address]                Office [phone]  Fax [phone]

## 2023-08-11 ENCOUNTER — Encounter: Payer: Self-pay | Admitting: Nurse Practitioner

## 2023-08-12 ENCOUNTER — Other Ambulatory Visit: Payer: Self-pay

## 2023-08-12 NOTE — Progress Notes (Signed)
08/12/2023 Name: Cynthia Becker MRN: [id number] DOB: August 03, 1970  Chief Complaint  Patient presents with   Diabetes   Medication Management    Cynthia Becker is a 53 y.o. year old female who presented for a telephone visit.   They were referred to the pharmacist by their PCP for assistance in managing diabetes and complex medication management.    Subjective:  Care Team: Primary Care Provider: Ivonne Andrew, NP ; Next Scheduled Visit: 09/17/2023  Medication Access/Adherence  Current Pharmacy:  [facility] [address] [place] - [address] [place] [address] Phone: [phone] Fax: [phone]   Patient reports affordability concerns with their medications: No  Patient reports access/transportation concerns to their pharmacy: No  Patient reports adherence concerns with their medications:  No     Diabetes:  Current medications: Ozempic 2 mg weekly  Patient reports weight fluctuates, but she has lost 5-6 more lbs since the last telephone visit ~6 weeks ago. She has started incorporating seated chair exercises for ~10 minutes twice a week and is considering aquatic PT as recommended by ortho. Wants to start journaling her exercises and keeping a food log. Reports that last Thursday through Saturday she noticed BG was elevated (FBG 150-160 and 2 hr PP BG 190-200), Since then this has decreased back to her usual (FBG 100-110). Patient reported on those days she ate a lot of chips and salsa and several pickles which was out of the normal for her. Thinks this contributed to elevations in blood sugar.  Using glucometer; testing 1-2 times daily Usually FBG 100-110  Patient denies hypoglycemic s/sx including dizziness, shakiness, sweating. Patient denies hyperglycemic symptoms including polyuria, polydipsia, polyphagia, nocturia, neuropathy, blurred vision.  Current meal patterns:  Current meal patterns: 1-2 meals/day - Breakfast:  skips - Lunch: sandwich, fruit, leftover vegetables - Supper: chicken, vegetables, sometimes rice - Snacks: fruit (grapes, oranges, pears, applesauce) - Drinks: grape juice with sugar, 3 bottles of water per day  Current medication access support: has Medicaid   Heart Failure (EF 60-65%):  Current medications:  ACEi/ARB/ARNI: Entresto 49-51 mg BID SGLT2i: none Beta blocker: none Mineralocorticoid Receptor Antagonist: spironolactone 25 mg daily Diuretic regimen: torsemide 60 mg BID  Current home blood pressure readings: 135/55, 135/70  Patient denies volume overload signs or symptoms including shortness of breath, lower extremity edema. Feels like she has been urinating less with torsemide lately and has had off and on hand swelling. Did not realize the salt content of pickles and recently eating more chips and salsa too.   Objective:  Lab Results  Component Value Date   HGBA1C 5.8 05/19/2023    Lab Results  Component Value Date   CREATININE 1.43 (H) 05/01/2023   BUN 16 05/01/2023   NA 137 05/01/2023   K 4.4 05/01/2023   CL 100 05/01/2023   CO2 28 05/01/2023    Lab Results  Component Value Date   CHOL 200 (H) 05/14/2021   HDL 41 05/14/2021   LDLCALC 141 (H) 05/14/2021   TRIG 99 05/14/2021   CHOLHDL 4.9 (H) 05/14/2021    Medications Reviewed Today     Reviewed by Vela Prose, RPH (Pharmacist) on 08/12/23 at 1711  Med List Status: <None>   Medication Order Taking? Sig Documenting Provider Last Dose Status Informant  ACCU-CHEK FASTCLIX LANCETS MISC [id number]  USE TO CHECK BLOOD SUGAR TWICE DAILY [provider]  Active Spouse/Significant Other  ACCU-CHEK GUIDE test strip [id number]  USE 1 STRIP  TO CHECK BLOOD SUGAR THREE TIMES DAILY Ivonne Andrew, NP  Active   albuterol (PROVENTIL) (2.5 MG/3ML) 0.083% nebulizer solution [id number] No USE 1 VIAL IN NEBULIZER EVERY 6 HOURS AS NEEDED FOR WHEEZING OR SHORTNESS OF BREATH  Patient not taking: Reported on  08/12/2023   Orion Crook I, NP Not Taking Active   allopurinol (ZYLOPRIM) 100 MG tablet [id number] Yes Take 1 tablet (100 mg total) by mouth daily. Ivonne Andrew, NP Taking Active   blood glucose meter kit and supplies KIT [id number]  Dispense based on patient and insurance preference. Use up to four times daily as directed. Ivonne Andrew, NP  Active   Blood Glucose Monitoring Suppl DEVI [id number]  1 each by Does not apply route 2 (two) times daily. May substitute to any manufacturer covered by patient's insurance. Ivonne Andrew, NP  Active   cariprazine (VRAYLAR) 3 MG capsule [id number] Yes Take 3 mg by mouth daily. [provider] Taking Active   colchicine 0.6 MG tablet [id number] Yes Take 1 tablet (0.6 mg total) by mouth daily. Ivonne Andrew, NP Taking Active   cyclobenzaprine (FLEXERIL) 5 MG tablet [id number] Yes Take 1 tablet (5 mg total) by mouth 3 (three) times daily as needed. for muscle spams Ivonne Andrew, NP Taking Active   ENTRESTO 49-51 MG [id number] Yes TAKE 1 TABLET BY MOUTH TWICE DAILY **  APPOINTMENT  REQUIRED  FOR  FUTURE  REFILLS** Bensimhon, Bevelyn Buckles, MD Taking Active   EQ ALLERGY RELIEF, CETIRIZINE, 10 MG tablet [id number] Yes Take 1 tablet by mouth once daily Ivonne Andrew, NP Taking Active   gabapentin (NEURONTIN) 300 MG capsule [id number] Yes Take 3 capsules (900 mg total) by mouth 3 (three) times daily. Ivonne Andrew, NP Taking Active   Glucose Blood (BLOOD GLUCOSE TEST STRIPS) STRP [id number]  1 each by In Vitro route 2 (two) times daily. May substitute to any manufacturer covered by patient's insurance. Ivonne Andrew, NP  Active   ibuprofen (ADVIL) 800 MG tablet [id number] Yes Take 800 mg by mouth every 8 (eight) hours as needed for moderate pain. [provider] Taking Active Spouse/Significant Other  Lancet Device MISC [id number]  1 each by Does not apply route 2 (two) times daily. May substitute to any manufacturer covered by patient's  insurance. Ivonne Andrew, NP  Active   Lancets Misc. MISC [id number]  1 each by Does not apply route in the morning and at bedtime. May substitute to any manufacturer covered by patient's insurance. Ivonne Andrew, NP  Active   LINZESS 290 MCG CAPS capsule [id number] Yes TAKE 1 CAPSULE BY MOUTH ONCE DAILY BEFORE BREAKFAST Ivonne Andrew, NP Taking Active   naloxone [facility]) nasal spray 4 mg/0.1 mL [id number]  Place 1 spray into the nose as needed. [provider]  Active Spouse/Significant Other  omeprazole (PRILOSEC) 20 MG capsule [id number] No Take 1 capsule (20 mg total) by mouth at bedtime.  Patient not taking: Reported on 08/12/2023   Ivonne Andrew, NP Not Taking Active   oxyCODONE-acetaminophen (PERCOCET) 10-325 MG tablet [id number] Yes Take 1 tablet by mouth every 4 (four) hours as needed for pain. [provider] Taking Active   potassium chloride SA (KLOR-CON M) 20 MEQ tablet [id number] Yes Take 1 tablet (20 mEq total) by mouth daily. Bensimhon, Bevelyn Buckles, MD Taking Active   promethazine (PHENERGAN) 25 MG tablet [id number] Yes Take 25 mg by mouth every 8 (eight) hours as needed for nausea or  vomiting. [provider] Taking Active Spouse/Significant Other  Semaglutide, 2 MG/DOSE, (OZEMPIC, 2 MG/DOSE,) 8 MG/3ML SOPN [id number] Yes Inject 2 mg into the skin once a week. Ivonne Andrew, NP Taking Active   spironolactone (ALDACTONE) 25 MG tablet [id number] Yes Take 1 tablet (25 mg total) by mouth daily. Alen Bleacher, NP Taking Active   torsemide (DEMADEX) 20 MG tablet [id number] Yes Take 3 tablets (60 mg total) by mouth 2 (two) times daily. Alen Bleacher, NP Taking Active               Assessment/Plan:   Diabetes: - Currently controlled based on A1c 5.8%. Recent elevated patient reported BG readings likely due to dietary indiscretion. FBG back to goal range the past 2 days. - Reviewed long term cardiovascular and renal outcomes of uncontrolled blood sugar -  Reviewed goal A1c, goal fasting, and goal 2 hour post prandial glucose - Reviewed dietary modifications including focusing on protein intake and staying hydrated with water - Reviewed lifestyle modifications including: commended her for starting chair exercises and encouraged her to increase frequency to every other day then daily if possible - Recommend to continue Ozempic 2 mg weekly  - Notified patient of Healthy weight and wellness attempt to outreach her on 2/13 and provided her with call back number. - Patient denies personal or family history of multiple endocrine neoplasia type 2, medullary thyroid cancer; personal history of pancreatitis or gallbladder disease. - Recommend to fasting blood glucose once daily   Heart Failure: - Currently  managed by [facility] - Reviewed appropriate blood pressure monitoring technique and reviewed goal blood pressure - Reviewed to weigh daily and when to contact cardiology with weight gain - Reviewed dietary modifications including limiting fluid and salt intake (pickles, chips and salsa)   Follow Up Plan: PCP visit on 09/17/2023  Jarrett Ables, PharmD PGY-1 Pharmacy Resident

## 2023-08-18 ENCOUNTER — Other Ambulatory Visit (HOSPITAL_COMMUNITY): Payer: Self-pay | Admitting: Internal Medicine

## 2023-08-18 ENCOUNTER — Other Ambulatory Visit: Payer: MEDICAID

## 2023-08-18 DIAGNOSIS — M109 Gout, unspecified: Secondary | ICD-10-CM

## 2023-08-19 LAB — URIC ACID: Uric Acid: 8.9 mg/dL — ABNORMAL HIGH (ref 3.0–7.2)

## 2023-09-15 ENCOUNTER — Telehealth (HOSPITAL_COMMUNITY): Payer: Self-pay | Admitting: Cardiology

## 2023-09-15 NOTE — Telephone Encounter (Signed)
 Patient left VM on triage line with concerns Swelling and weight gain (up 12 lbs) Reports increased SOB, increased with exertion Pitting edema in ankles   Returned call reports compliance with diuretic  60 BID, even increased to 60/80 with no relief Would like an appt to discuss   Add on 3/25 @ 1030

## 2023-09-16 ENCOUNTER — Encounter (HOSPITAL_COMMUNITY): Payer: Self-pay

## 2023-09-16 ENCOUNTER — Ambulatory Visit (HOSPITAL_COMMUNITY)
Admission: RE | Admit: 2023-09-16 | Discharge: 2023-09-16 | Disposition: A | Discharge status: Home / Self Care | Payer: MEDICAID | Source: Ambulatory Visit | Attending: Cardiology | Admitting: Cardiology

## 2023-09-16 VITALS — BP 126/79 | HR 100 | Ht 65.0 in | Wt >= 6400 oz

## 2023-09-16 DIAGNOSIS — Z9981 Dependence on supplemental oxygen: Secondary | ICD-10-CM | POA: Diagnosis not present

## 2023-09-16 DIAGNOSIS — Z87891 Personal history of nicotine dependence: Secondary | ICD-10-CM | POA: Insufficient documentation

## 2023-09-16 DIAGNOSIS — Z6841 Body Mass Index (BMI) 40.0 and over, adult: Secondary | ICD-10-CM | POA: Diagnosis not present

## 2023-09-16 DIAGNOSIS — Z79899 Other long term (current) drug therapy: Secondary | ICD-10-CM | POA: Diagnosis not present

## 2023-09-16 DIAGNOSIS — Z794 Long term (current) use of insulin: Secondary | ICD-10-CM | POA: Diagnosis not present

## 2023-09-16 DIAGNOSIS — I5032 Chronic diastolic (congestive) heart failure: Secondary | ICD-10-CM | POA: Insufficient documentation

## 2023-09-16 DIAGNOSIS — E119 Type 2 diabetes mellitus without complications: Secondary | ICD-10-CM | POA: Insufficient documentation

## 2023-09-16 DIAGNOSIS — I110 Hypertensive heart disease with heart failure: Secondary | ICD-10-CM | POA: Insufficient documentation

## 2023-09-16 DIAGNOSIS — J45909 Unspecified asthma, uncomplicated: Secondary | ICD-10-CM | POA: Insufficient documentation

## 2023-09-16 DIAGNOSIS — Z7985 Long-term (current) use of injectable non-insulin antidiabetic drugs: Secondary | ICD-10-CM | POA: Insufficient documentation

## 2023-09-16 DIAGNOSIS — J9611 Chronic respiratory failure with hypoxia: Secondary | ICD-10-CM | POA: Insufficient documentation

## 2023-09-16 LAB — BASIC METABOLIC PANEL
Anion gap: 11 (ref 5–15)
BUN: 12 mg/dL (ref 6–20)
CO2: 30 mmol/L (ref 22–32)
Calcium: 8.9 mg/dL (ref 8.9–10.3)
Chloride: 98 mmol/L (ref 98–111)
Creatinine, Ser: 1.21 mg/dL — ABNORMAL HIGH (ref 0.44–1.00)
GFR, Estimated: 54 mL/min — ABNORMAL LOW (ref >60)
Glucose, Bld: 76 mg/dL (ref 70–99)
Potassium: 3.5 mmol/L (ref 3.5–5.1)
Sodium: 139 mmol/L (ref 135–145)

## 2023-09-16 LAB — BRAIN NATRIURETIC PEPTIDE: B Natriuretic Peptide: 12.1 pg/mL (ref 0.0–100.0)

## 2023-09-16 MED ORDER — METOLAZONE 2.5 MG PO TABS: 2.5000 mg | ORAL_TABLET | ORAL | 0 refills | Status: DC

## 2023-09-16 MED ORDER — POTASSIUM CHLORIDE CRYS ER 20 MEQ PO TBCR: 20.0000 meq | EXTENDED_RELEASE_TABLET | Freq: Every day | ORAL | 11 refills | Status: DC

## 2023-09-16 NOTE — Patient Instructions (Addendum)
 Medication Changes:  TAKE METOLAZONE 2.5MG  ONCE DAILY FOR THE NEXT 2 DAYS (TAKE THIS 30 MINS BEFORE TORSEMIDE)  CONTINUE CURRENT DOSE OF TORSEMIDE 80MG  IN THE MORNING AND 60MG  IN THE EVENING  TAKE AN EXTRA (2) TABLETS OF POTASSIUM WITH METOLAZONE DOSE EVERY TIME YOU TAKE IT   AFTER THE NEXT 2 DAYS YOU MAY USE METOLAZONE 2.5MG  ONCE WEEKLY AS NEEDED- IF YOU NEED THIS MORE THAN 1 TIME WEEKLY PLEASE CALL AND LET us KNOW   Lab Work:  Labs done today, your results will be available in MyChart, we will contact you for abnormal readings.  Follow-Up in: 4 WEEKS AS SCHEDULED WITH APP   At the [facility], you and your health needs are our priority. We have a designated team specialized in the treatment of Heart Failure. This Care Team includes your primary Heart Failure Specialized Cardiologist (physician), Advanced Practice Providers (APPs- Physician Assistants and Nurse Practitioners), and Pharmacist who all work together to provide you with the care you need, when you need it.   You may see any of the following providers on your designated Care Team at your next follow up:  Dr. Arvilla Meres Dr. Marca Ancona Dr. Dorthula Nettles Dr. Theresia Bough Tonye Becket, NP Robbie Lis, [place] [facility] [place], [place] Brynda Peon, NP [place] Lee, NP Karle Plumber, PharmD   Please be sure to bring in all your medications bottles to every appointment.   Need to Contact us:  If you have any questions or concerns before your next appointment please send [place] a message through [place] or call our office at [phone].    TO LEAVE A MESSAGE FOR THE NURSE SELECT OPTION 2, PLEASE LEAVE A MESSAGE INCLUDING: YOUR NAME DATE OF BIRTH CALL BACK NUMBER REASON FOR CALL**this is important as we prioritize the call backs  YOU WILL RECEIVE A CALL BACK THE SAME DAY AS LONG AS YOU CALL BEFORE 4:00 PM

## 2023-09-16 NOTE — Progress Notes (Signed)
 [facility] NOTE   PCP: Thad Ranger NP  Pulmonology: Dr. Chestine Spore Primary Cardiologist: Dr Rosemary Holms  HF Cardiologist: Dr. Gala Romney  Reason for Visit: Leg Swelling   HPI: Cynthia Becker is a 53 y.o. female w/ a history of chronic diastolic CHF, HTN, morbid obesity, type II DM, HLD tobacco use, asthma, chronic respiratory failure with hypoxia on supplemental O2.    She's had multiple admissions over the last few years with a/c respiratory failure with hypoxia secondary to a/c diastolic CHF.    Echo 04/22: EF 60-65%, RV not well visualized, study technically difficult d/t body habitus.   Admitted 12/22 with a/c respiratory failure with hypoxia secondary to influenza A pneumonia and a/c CHF. Diuresed with IV lasix. Discharged on torsemide 40 mg daily. Course complicated by encephalopathy, felt to be likely d/t CO2 narcosis vs narcotic use.   Saw her PCP for f/u 06/12/21. Noted 20 lb weight gain after discharge. Torsemide increased to 40 mg bid. She was subsequently referred to [facility] for evaluation.  First seen in [facility] clinic 09/21/21, markedly volume overloaded, weight 464 lbs. Spiro 12.5 added, and instructed to take metolazone 2x/week. Zio-14 day placed at follow up due to palpitations.  Follow up 6/23, she was volume overloaded with NYHA IIIb symptoms. Torsemide increased and metolazone added twice a week. Discussed using Furoscix in the future and started approval process.  Called clinic 01/04/22 after she had use a Furoscix cartridge. Used Furoscix x 3 doses 7/21, 7/22, and 7/23.  Started ozempic 12/23. She has lost about 40 lbs.   She presents today as a work in given new complaint of LEE. She has tried increasing her torsemide from 60 mg bid to 80 qam/ 60 qpm w/o improvement. She also notes 16 lb wt gain at home over the last 3 wks. Her weight had went down to 419 lb at home on Ozempic but shot up rapidly. Reports full med compliance. Says diet is  unchanged. Trying to watch sodium intake. Says hands/fingers also feels puffy. Feels like she is retaining fluid. Also w/ slight increase in dyspnea.     Cardiac Studies: - Echo 04/15/22 EF 60-65% RV ok - Echo (4/22): EF 60-65%, RV not well visualized, study technically difficult d/t body habitus. - Echo (11/21): EF 60-65%, moderate LVH, grade II DD, RV normal - Echo (2/21): EF 50-55%, grade II DD, moderate LVH, technically difficult study.  ROS: All systems reviewed and negative except as per HPI.   Past Medical History:  Diagnosis Date   Anemia    iv iron treatment dec 2014/ see oncology for this last in dec 2014   Anxiety    Arthritis    knee, hands   Asthma    seasonal related/ albuterol used when uri   CHF (congestive heart failure) (HCC)    CN (constipation) 09/14/2014   DEGENERATIVE DISC DISEASE, LUMBOSACRAL SPINE W/RADICULOPATHY 11/01/2009   Qualifier: Diagnosis of  By: Huntley Dec, Scott     Depression    Diabetes (HCC)    H/O dizziness    Hx   Headache(784.0)    otc med prn   High blood pressure    HYPERLIPIDEMIA 02/13/2010   Qualifier: Diagnosis of  By: Brynda Rim     INSOMNIA 08/24/2008   Qualifier: Diagnosis of  By: Barbaraann Barthel MD, [place]     Morbid obesity (HCC) 05/24/2008   Qualifier: Diagnosis of  By: Barbaraann Barthel MD, [place]     SVD (spontaneous vaginal delivery)  x 3   Current Outpatient Medications  Medication Sig Dispense Refill   ACCU-CHEK FASTCLIX LANCETS MISC USE TO CHECK BLOOD SUGAR TWICE DAILY  11   ACCU-CHEK GUIDE test strip USE 1 STRIP TO CHECK BLOOD SUGAR THREE TIMES DAILY 250 each 0   albuterol (PROVENTIL) (2.5 MG/3ML) 0.083% nebulizer solution USE 1 VIAL IN NEBULIZER EVERY 6 HOURS AS NEEDED FOR WHEEZING OR SHORTNESS OF BREATH 150 mL 1   allopurinol (ZYLOPRIM) 100 MG tablet Take 1 tablet (100 mg total) by mouth daily. 30 tablet 2   blood glucose meter kit and supplies KIT Dispense based on patient and insurance preference. Use up to four times  daily as directed. 1 each 0   Blood Glucose Monitoring Suppl DEVI 1 each by Does not apply route 2 (two) times daily. May substitute to any manufacturer covered by patient's insurance. 1 each 0   cariprazine (VRAYLAR) 3 MG capsule Take 3 mg by mouth daily.     colchicine 0.6 MG tablet Take 1 tablet (0.6 mg total) by mouth daily. 30 tablet 0   cyclobenzaprine (FLEXERIL) 5 MG tablet Take 1 tablet (5 mg total) by mouth 3 (three) times daily as needed. for muscle spams 90 tablet 0   ENTRESTO 49-51 MG TAKE 1 TABLET BY MOUTH TWICE DAILY **  APPOINTMENT  REQUIRED  FOR  FUTURE  REFILLS** 60 tablet 2   EQ ALLERGY RELIEF, CETIRIZINE, 10 MG tablet Take 1 tablet by mouth once daily 90 tablet 0   gabapentin (NEURONTIN) 300 MG capsule Take 3 capsules (900 mg total) by mouth 3 (three) times daily. 270 capsule 2   Glucose Blood (BLOOD GLUCOSE TEST STRIPS) STRP 1 each by In Vitro route 2 (two) times daily. May substitute to any manufacturer covered by patient's insurance. 100 each 0   ibuprofen (ADVIL) 800 MG tablet Take 800 mg by mouth every 8 (eight) hours as needed for moderate pain.     LINZESS 290 MCG CAPS capsule TAKE 1 CAPSULE BY MOUTH ONCE DAILY BEFORE BREAKFAST 30 capsule 0   metolazone (ZAROXOLYN) 2.5 MG tablet Take 1 tablet (2.5 mg total) by mouth as directed. BY [facility] FAILURE CLINIC 10 tablet 0   omeprazole (PRILOSEC) 20 MG capsule Take 1 capsule (20 mg total) by mouth at bedtime. 90 capsule 0   oxyCODONE-acetaminophen (PERCOCET) 10-325 MG tablet Take 1 tablet by mouth every 4 (four) hours as needed for pain.     promethazine (PHENERGAN) 25 MG tablet Take 25 mg by mouth every 8 (eight) hours as needed for nausea or vomiting.     Semaglutide, 2 MG/DOSE, (OZEMPIC, 2 MG/DOSE,) 8 MG/3ML SOPN Inject 2 mg into the skin once a week. 3 mL 11   spironolactone (ALDACTONE) 25 MG tablet Take 1 tablet (25 mg total) by mouth daily. 90 tablet 3   torsemide (DEMADEX) 20 MG tablet Take 3 tablets (60 mg total) by mouth 2  (two) times daily. 565 tablet 2   naloxone (NARCAN) nasal spray 4 mg/0.1 mL Place 1 spray into the nose as needed.     potassium chloride SA (KLOR-CON M) 20 MEQ tablet Take 1 tablet (20 mEq total) by mouth daily. TAKE AN EXTRA (2) TABLETS ON DAYS THAT YOU TAKE METOLAZONE 2.5MG  30 tablet 11   No current facility-administered medications for this encounter.   No Known Allergies  Social History   Socioeconomic History   Marital status: Married    Spouse name: Not on file   Number of children: 3  Years of education: Not on file   Highest education level: Not on file  Occupational History   Not on file  Tobacco Use   Smoking status: Former    Current packs/day: 0.00    Types: Cigarettes    Quit date: 04/24/2020    Years since quitting: 3.3   Smokeless tobacco: Never   Tobacco comments:    4-5 cigarettes a day  Vaping Use   Vaping status: Never Used  Substance and Sexual Activity   Alcohol use: No   Drug use: No   Sexual activity: Not on file  Other Topics Concern   Not on file  Social History Narrative   Not on file   Social Drivers of Health   Financial Resource Strain: Not on file  Food Insecurity: Not on file  Transportation Needs: Not on file  Physical Activity: Not on file  Stress: Not on file  Social Connections: Not on file  Intimate Partner Violence: Not on file   Family History  Problem Relation Age of Onset   Diabetes Mother    Hypertension Father    Heart disease Other    Diabetes Other    Hypertension Other    Alcohol abuse Other    Hypertension Brother    Asthma Brother    Hypertension Brother    Asthma Brother    COPD Maternal Grandmother    Lung cancer Maternal Grandmother    Asthma Paternal Grandmother    Asthma Paternal Aunt    BP 126/79   Pulse 100   Ht 5\' 5"  (1.651 m)   Wt (!) 197.9 kg (436 lb 3.2 oz)   LMP 02/13/2014   SpO2 97%   BMI 72.59 kg/m   Wt Readings from Last 3 Encounters:  09/16/23 (!) 197.9 kg (436 lb 3.2 oz)   07/24/23 (!) 188.7 kg (416 lb)  05/19/23 (!) 197.8 kg (436 lb)   EKG not performed   PHYSICAL EXAM: General:  super morbid obesity, in WC, no respiratory difficulty HEENT: normal Neck: supple. JVD 8-9 cm. Carotids 2+ bilat; no bruits. No lymphadenopathy or thyromegaly appreciated. Cor: PMI nondisplaced. Regular rate & rhythm. No rubs, gallops or murmurs. Lungs: clear Abdomen: soft, nontender, obese, +distended. No hepatosplenomegaly. No bruits or masses. Good bowel sounds. Extremities: no cyanosis, clubbing, rash, 2+ b/ LE edema Neuro: alert & oriented x 3, cranial nerves grossly intact. moves all 4 extremities w/o difficulty. Affect pleasant.     ASSESSMENT & PLAN: 1. Chronic HFpEF  - Echo (4/22): EF 60-65%, RV poorly visualized. - Echo 04/15/22 EF 60-65% RV ok  - NYHA III-IIIb, confounded by super morbid obesity and deconditioning.  - Wt up 16 lb in 3 wk, peripheral edema/LEE on exam - Continue torsemide 80 mg qam/ 60 mg qpm  - Add Metolazone 2.5 mg daily x 2 days w/ extra 40 mEq of KCl then PRN going forward - Continue spironolactone 25 mg daily.  - Continue Entresto 49/51 bid - No SGLT2i due hygeine  - Check BMP/BNP today    3. Morbid Obesity - Body mass index is 72.59 kg/m. - on Ozempic    4. DMII - Followed by PCP. - On insulin. - Continue Ozempic   5. HTN - controlled on current regimen  - continue GDMT per above   Follow up in 4 wks w/ APP to reassess volume status.   Ariana Juul PA-C 11:18 AM

## 2023-09-17 ENCOUNTER — Ambulatory Visit: Payer: Self-pay | Admitting: Nurse Practitioner

## 2023-09-18 ENCOUNTER — Telehealth: Payer: Self-pay | Admitting: Nurse Practitioner

## 2023-09-18 NOTE — Telephone Encounter (Signed)
 Copied from CRM [id number]. Topic: Appointments - Scheduling Inquiry for Clinic >> Sep 18, 2023 12:22 PM Cynthia Becker wrote: Reason for CRM: Pt caled to reschedule her upcoming appt with PCP, [facility] would not let me reschedule with any [facility] not even her PCP.   Best contact: [phone]

## 2023-09-29 ENCOUNTER — Other Ambulatory Visit: Payer: Self-pay | Admitting: Nurse Practitioner

## 2023-10-06 ENCOUNTER — Ambulatory Visit: Payer: MEDICAID | Admitting: Sports Medicine

## 2023-10-06 ENCOUNTER — Other Ambulatory Visit: Payer: Self-pay

## 2023-10-06 ENCOUNTER — Ambulatory Visit: Payer: Self-pay | Admitting: Nurse Practitioner

## 2023-10-06 ENCOUNTER — Encounter: Payer: Self-pay | Admitting: Sports Medicine

## 2023-10-06 DIAGNOSIS — M170 Bilateral primary osteoarthritis of knee: Secondary | ICD-10-CM | POA: Diagnosis not present

## 2023-10-06 DIAGNOSIS — G8929 Other chronic pain: Secondary | ICD-10-CM | POA: Diagnosis not present

## 2023-10-06 DIAGNOSIS — M25561 Pain in right knee: Secondary | ICD-10-CM | POA: Diagnosis not present

## 2023-10-06 DIAGNOSIS — E0843 Diabetes mellitus due to underlying condition with diabetic autonomic (poly)neuropathy: Secondary | ICD-10-CM

## 2023-10-06 MED ORDER — METHYLPREDNISOLONE ACETATE 40 MG/ML IJ SUSP
40.0000 mg | INTRAMUSCULAR | Status: AC | PRN
  Administered 2023-10-06: 40 mg via INTRA_ARTICULAR

## 2023-10-06 MED ORDER — BUPIVACAINE HCL 0.25 % IJ SOLN
2.0000 mL | INTRAMUSCULAR | Status: AC | PRN
  Administered 2023-10-06: 2 mL via INTRA_ARTICULAR

## 2023-10-06 MED ORDER — LIDOCAINE HCL 1 % IJ SOLN
2.0000 mL | INTRAMUSCULAR | Status: AC | PRN
  Administered 2023-10-06: 2 mL

## 2023-10-06 NOTE — Progress Notes (Signed)
 Cynthia Becker - 53 y.o. female MRN [id number]  Date of birth: 04-11-1971  Office Visit Note: Visit Date: 10/06/2023 PCP: Ivonne Andrew, NP Referred by: Ivonne Andrew, NP  Subjective: Chief Complaint  Patient presents with   Left Knee - Follow-up   Right Knee - Follow-up   HPI: Cynthia Becker is a pleasant 53 y.o. female who presents today for follow-up of bilateral knee pain with advanced osteoarthritis.  R-knee US-inj 07/08/23 and L-knee US-inj 07/22/23 - which did give her relief for period of time. Has been switching up regimen for heart failure medications and with routine follow-up with cardiology because she has gained about 15 lbs of water weight, reports 9 lb weight gain in one week.  We did have a discussion last time regarding aquatic-based physical therapy to help with joint mobility but to lessen the gravity burden.  She does report she is unable to swim, but she is interested in at least considering this.  She is managed on chronic oxycodone 10 mg every 4-6 hours as needed for pain control which she receives through pain management.  She also discusses with me she is using ibuprofen 800 mg 3 times daily as well.  She is a type II diabetic, although she has been much better controlled recently. Lab Results  Component Value Date   HGBA1C 5.8 05/19/2023   Pertinent ROS were reviewed with the patient and found to be negative unless otherwise specified above in HPI.   Assessment & Plan: Visit Diagnoses:  1. Chronic pain of right knee   2. Bilateral primary osteoarthritis of knee   3. Morbid obesity (HCC)   4. Diabetes mellitus due to underlying condition with diabetic autonomic neuropathy, unspecified whether long term insulin use (HCC)    Plan: Impression is acute on chronic bilateral knee pain with advanced osteoarthritis.  She is experiencing an exacerbation of her chronic right knee pain.  I do believe this is from her arthritis, but she also has lower extremity  fluid retention from her heart failure which I believe is exacerbating both of her leg pain.  She is following up with cardiology for this, I did recommend her letting them know she is using ibuprofen 800 mg 3 times daily, this is something they may want to discontinue given her fluid retention and her heart failure.  Through shared decision-making, we did proceed with right knee ultrasound-guided intra-articular injection, patient tolerated well.  Discussed postinjection protocol, ice and Tylenol for any postinjection pain.  She will continue her oxycodone 10 mg every 4-6 hours as needed which she receives through pain management.  She will continue her Ozempic 2 mg once weekly and working on healthy lifestyle measures to bring down her BMI.  We did send a referral to aquatic-based physical therapy to help with joint mobility but less than her gravity burden.  She does have some questions regarding the depth and safety of the pool given she cannot swim, I discussed she is able to talk about this with the therapist before beginning.   Follow-up: For b/l knees as needed  Meds & Orders: No orders of the defined types were placed in this encounter.   Orders Placed This Encounter  Procedures   US Guided Needle Placement - No Linked Charges   Ambulatory referral to Physical Therapy     Procedures: Large Joint Inj: R knee on 10/06/2023 5:16 PM Indications: pain Details: 22 G 1.5 in and 3.5 in needle, ultrasound-guided superolateral approach Medications: 2 mL  lidocaine 1 %; 2 mL bupivacaine 0.25 %; 40 mg methylPREDNISolone acetate 40 MG/ML Outcome: tolerated well, no immediate complications  Procedure: Ultrasound-guided Knee Injection, Right After discussion on risk/benefits/indication, informed verbal consent was obtained. A timeout was then performed. The patient was lying in supine on examination table with a small bolster underneath the affected knee for comfort. The patient's knee was prepped with  Chloraprep and alcohol swabs. Utilizing ultrasound-guidance with the probe in a transverse position, the patient's suprapatellar bursa was identified and the knee joint was subsequently injected intraarticularly with a mixture of 2:2:1 lidocaine:bupivicaine:depomedrol utilizing an in-plane visualization approach. Patient tolerated the procedure well without immediate complications.  Procedure, treatment alternatives, risks and benefits explained, specific risks discussed. Consent was given by the patient. Patient was prepped and draped in the usual sterile fashion.          Clinical History: No specialty comments available.  She reports that she quit smoking about 3 years ago. Her smoking use included cigarettes. She has never used smokeless tobacco.  Recent Labs    02/14/23 1617 05/19/23 1546 07/24/23 1553 08/18/23 1507  HGBA1C 5.9* 5.8  --   --   LABURIC  --   --  11.0* 8.9*    Objective:   Vital Signs: LMP 02/13/2014   Physical Exam  Gen: Well-appearing, in no acute distress; non-toxic CV: Well-perfused. Warm.  Resp: Breathing unlabored on room air; no wheezing. Psych: Fluid speech in conversation; appropriate affect; normal thought process  Ortho Exam - Bilateral knees: Patient was seated in a wheelchair today, nonambulatory on examination.  There is no redness swelling or effusion in the knees.  There is bilateral patellar crepitus and some joint line tenderness.  Imaging:  - 11/15/22: Bilateral knee x-rays reviewed today  DG Knee Complete 4 Views Left CLINICAL DATA:  bilateral knee pain  EXAM: Bilateral knees - four views  COMPARISON:  05/17/2021  FINDINGS: No evidence of fracture, dislocation, or joint effusion. There is bilateral tricompartmental joint space narrowing, sclerosis and osteophytes consistent with degenerative joint disease. No evidence of effusion.  IMPRESSION: Bilateral degenerative changes.  Electronically Signed   By: Layla Maw  M.D.   On: 11/15/2022 21:19 DG Knee Complete 4 Views Right CLINICAL DATA:  bilateral knee pain  EXAM: Bilateral knees - four views  COMPARISON:  05/17/2021  FINDINGS: No evidence of fracture, dislocation, or joint effusion. There is bilateral tricompartmental joint space narrowing, sclerosis and osteophytes consistent with degenerative joint disease. No evidence of effusion.  IMPRESSION: Bilateral degenerative changes.  Electronically Signed   By: Layla Maw M.D.   On: 11/15/2022 21:19    Past Medical/Family/Surgical/Social History: Medications & Allergies reviewed per EMR, new medications updated. Patient Active Problem List   Diagnosis Date Noted   Class 3 severe obesity due to excess calories with body mass index (BMI) greater than or equal to 70 in adult [facility]) 02/20/2023   Chronic pain of both knees 11/08/2022   Osteoarthritis of both knees    Influenza A 05/17/2021   Flu 05/16/2021   Acute on chronic respiratory failure with hypoxia and hypercapnia (HCC) 04/17/2021   Abnormal CT of brain 04/17/2021   Polycythemia 04/17/2021   Chronic back pain 04/17/2021   Leukocytosis 04/17/2021   Episode of unresponsiveness 04/17/2021   Acute respiratory failure with hypoxia (HCC) 10/02/2020   Chronic diastolic CHF (congestive heart failure) (HCC) 04/29/2020   Essential hypertension 04/25/2020   Community acquired bacterial pneumonia 04/25/2020   Class 3 obesity 04/25/2020  Diabetes mellitus due to underlying condition with diabetic autonomic neuropathy, unspecified whether long term insulin use (HCC) 04/24/2020   Acute respiratory failure (HCC) 04/24/2020   Asthmatic bronchitis 03/21/2020   Nocturnal hypoxia 03/21/2020   OSA (obstructive sleep apnea) 12/22/2019   Shortness of breath 08/09/2019   No-show for appointment 06/30/2019   Morbid obesity with BMI of 60.0-69.9, adult (HCC) 09/14/2014   Iron deficiency anemia due to chronic blood loss 09/14/2014    Constipation 09/14/2014   Postoperative state 04/12/2014   Uterine fibroid 03/17/2014   DUB (dysfunctional uterine bleeding) 01/27/2014   Iron deficiency anemia due to chronic blood loss 04/09/2013   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   Menometrorrhagia 04/09/2013   Left hand pain 09/18/2011   High blood pressure    ANEMIA, IRON DEFICIENCY 08/06/2010   CHOLECYSTECTOMY, HX OF 04/09/2010   Dyslipidemia 02/13/2010   DEGENERATIVE DISC DISEASE, LUMBOSACRAL SPINE W/RADICULOPATHY 11/01/2009   BACK PAIN, LUMBAR, WITH RADICULOPATHY 10/30/2009   TRIGGER FINGER 02/03/2009   Carpal tunnel syndrome 12/21/2008   Headache(784.0) 11/09/2008   Depression 10/04/2008   URI 08/24/2008   INSOMNIA 08/24/2008   Asthma 06/06/2008   Morbid obesity (HCC) 05/24/2008   ESSENTIAL HYPERTENSION 05/24/2008   Past Medical History:  Diagnosis Date   Anemia    iv iron treatment dec 2014/ see oncology for this last in dec 2014   Anxiety    Arthritis    knee, hands   Asthma    seasonal related/ albuterol used when uri   CHF (congestive heart failure) (HCC)    CN (constipation) 09/14/2014   DEGENERATIVE DISC DISEASE, LUMBOSACRAL SPINE W/RADICULOPATHY 11/01/2009   Qualifier: Diagnosis of  By: Earle Glatter, Scott     Depression    Diabetes (HCC)    H/O dizziness    Hx   Headache(784.0)    otc med prn   High blood pressure    HYPERLIPIDEMIA 02/13/2010   Qualifier: Diagnosis of  By: Earle Glatter, Scott     INSOMNIA 08/24/2008   Qualifier: Diagnosis of  By: Katheleen Palmer MD, [place]     Morbid obesity (HCC) 05/24/2008   Qualifier: Diagnosis of  By: Katheleen Palmer MD, [place]     SVD (spontaneous vaginal delivery)    x 3   Family History  Problem Relation Age of Onset   Diabetes Mother    Hypertension Father    Heart disease Other    Diabetes Other    Hypertension Other    Alcohol abuse Other    Hypertension Brother    Asthma Brother    Hypertension Brother    Asthma Brother    COPD Maternal Grandmother    Lung  cancer Maternal Grandmother    Asthma Paternal Grandmother    Asthma Paternal Aunt    Past Surgical History:  Procedure Laterality Date   ABDOMINAL HYSTERECTOMY  03/13/2014   total    BILATERAL SALPINGECTOMY Bilateral 04/12/2014   Procedure: BILATERAL SALPINGECTOMY;  Surgeon: Tresia Fruit, MD;  Location: [facility];  Service: Gynecology;  Laterality: Bilateral;   CARPAL TUNNEL RELEASE  08/30/2011   Procedure: CARPAL TUNNEL RELEASE;  Surgeon: Kemp Patter, MD;  Location: [facility];  Service: Orthopedics;  Laterality: Right;   CHOLECYSTECTOMY  10/11   lap choli   GALLBLADDER SURGERY  04/04/2010   HYSTEROSCOPY N/A 02/08/2014   Procedure: HYSTEROSCOPY;  Surgeon: Tresia Fruit, MD;  Location: [facility];  Service: Gynecology;  Laterality: N/A;   HYSTEROSCOPY WITH NOVASURE N/A 02/08/2014   Procedure:  ATTEMPTED NOVASURE ABLATION;  Surgeon: Tresia Fruit, MD;  Location: [facility];  Service: Gynecology;  Laterality: N/A;   INTRAUTERINE DEVICE INSERTION     07/19/2013   SEPTOPLASTY N/A 09/08/2013   Procedure: SEPTOPLASTY;  Surgeon: Littie Rife, MD;  Location: [facility] OR;  Service: ENT;  Laterality: N/A;   SPINE SURGERY     TONSILLECTOMY     TONSILLECTOMY AND ADENOIDECTOMY Bilateral 09/08/2013   Procedure: TONSILLECTOMY ;  Surgeon: Littie Rife, MD;  Location: [facility] OR;  Service: ENT;  Laterality: Bilateral;   TUBAL LIGATION     VAGINAL HYSTERECTOMY N/A 04/12/2014   Procedure: LAPAROSCOPIC ASSISTED VAGINAL HYSTERECTOMY;  Surgeon: Tresia Fruit, MD;  Location: [facility];  Service: Gynecology;  Laterality: N/A;   WISDOM TOOTH EXTRACTION     Social History   Occupational History   Not on file  Tobacco Use   Smoking status: Former    Current packs/day: 0.00    Types: Cigarettes    Quit date: 04/24/2020    Years since quitting: 3.4   Smokeless tobacco: Never   Tobacco comments:    4-5 cigarettes a day  Vaping Use   Vaping status: Never Used  Substance and Sexual Activity   Alcohol use: No    Drug use: No   Sexual activity: Not on file

## 2023-10-06 NOTE — Progress Notes (Signed)
 Patient says that her knees have been bothering her again for about a month. She says that typically they begin to bother her again about 2 weeks before she is due for another injection, but she has been in more pain this time. She says that has had more trouble with her heart failure and fluid in her body, and they are changing up her regimen for that. She says that both knees are bothering her, but the right is worse than the left which is normal; she says the right knee constantly throbs.

## 2023-10-07 ENCOUNTER — Ambulatory Visit (INDEPENDENT_AMBULATORY_CARE_PROVIDER_SITE_OTHER): Payer: MEDICAID | Admitting: Nurse Practitioner

## 2023-10-07 ENCOUNTER — Encounter: Payer: Self-pay | Admitting: Nurse Practitioner

## 2023-10-07 VITALS — BP 107/61 | HR 100 | Temp 98.1°F | Wt >= 6400 oz

## 2023-10-07 DIAGNOSIS — G894 Chronic pain syndrome: Secondary | ICD-10-CM | POA: Diagnosis not present

## 2023-10-07 DIAGNOSIS — E119 Type 2 diabetes mellitus without complications: Secondary | ICD-10-CM | POA: Diagnosis not present

## 2023-10-07 LAB — POCT GLYCOSYLATED HEMOGLOBIN (HGB A1C): Hemoglobin A1C: 5.8 % — AB (ref 4.0–5.6)

## 2023-10-07 MED ORDER — DULOXETINE HCL 20 MG PO CPEP: 20.0000 mg | ORAL_CAPSULE | Freq: Every day | ORAL | 3 refills | Status: DC

## 2023-10-07 NOTE — Progress Notes (Signed)
 Subjective   Patient ID: Cynthia Becker, female    DOB: Mar 10, 1971, 53 y.o.   MRN: [id number]  Chief Complaint  Patient presents with   Medical Management of Chronic Issues    Patient stated that she would like to change the Gabapentin, due to it making her tired and no energy     Referring provider: Ivonne Andrew, NP  Yariela Tison is a 53 y.o. female with Past Medical History: No date: Anemia     Comment:  iv iron treatment dec 2014/ see oncology for this last               in dec 2014 No date: Anxiety No date: Arthritis     Comment:  knee, hands No date: Asthma     Comment:  seasonal related/ albuterol used when uri No date: CHF (congestive heart failure) (HCC) 09/14/2014: CN (constipation) 11/01/2009: DEGENERATIVE DISC DISEASE, LUMBOSACRAL SPINE W/ RADICULOPATHY     Comment:  Qualifier: Diagnosis of  By: Huntley Dec, Scott   No date: Depression No date: Diabetes (HCC) No date: H/O dizziness     Comment:  Hx No date: Headache(784.0)     Comment:  otc med prn No date: High blood pressure 02/13/2010: HYPERLIPIDEMIA     Comment:  Qualifier: Diagnosis of  By: Huntley Dec, Scott   08/24/2008: INSOMNIA     Comment:  Qualifier: Diagnosis of  By: Barbaraann Barthel MD, Benetta Spar   05/24/2008: Morbid obesity (HCC)     Comment:  Qualifier: Diagnosis of  By: Barbaraann Barthel MD, [place]   No date: SVD (spontaneous vaginal delivery)     Comment:  x 3   HPI  Patient presents today for a follow-up visit for hypertension and diabetes.  Overall she has been doing well.  Patient is requesting to stop Neurontin because it makes her too sleepy.  She did not discuss possibly starting Cymbalta with her psychiatrist and psychiatry stated that this would be okay with her other medications.  We discussed that she can wean off the gabapentin and start low-dose Cymbalta and we can increase as needed.  Patient followed with orthopedics. A1C today is 5.8.  Denies f/c/s, n/v/d, hemoptysis, PND, leg swelling. Denies  chest pain or edema.     No Known Allergies  Immunization History  Administered Date(s) Administered   Influenza Whole 04/25/2010   Influenza,inj,Quad PF,6+ Mos 03/07/2014, 03/10/2019   Pneumococcal Polysaccharide-23 04/13/2014   Td 08/02/2010    Tobacco History: Social History   Tobacco Use  Smoking Status Former   Current packs/day: 0.00   Types: Cigarettes   Quit date: 04/24/2020   Years since quitting: 3.4  Smokeless Tobacco Never  Tobacco Comments   4-5 cigarettes a day   Counseling given: Not Answered Tobacco comments: 4-5 cigarettes a day   Outpatient Encounter Medications as of 10/07/2023  Medication Sig   ACCU-CHEK FASTCLIX LANCETS MISC USE TO CHECK BLOOD SUGAR TWICE DAILY   ACCU-CHEK GUIDE test strip USE 1 STRIP TO CHECK BLOOD SUGAR THREE TIMES DAILY   ACCU-CHEK GUIDE TEST test strip USE 1 STRIP TO CHECK GLUCOSE TWICE DAILY   albuterol (PROVENTIL) (2.5 MG/3ML) 0.083% nebulizer solution USE 1 VIAL IN NEBULIZER EVERY 6 HOURS AS NEEDED FOR WHEEZING OR SHORTNESS OF BREATH   allopurinol (ZYLOPRIM) 100 MG tablet Take 1 tablet (100 mg total) by mouth daily.   blood glucose meter kit and supplies KIT Dispense based on patient and insurance preference. Use up to four times daily as directed.  Blood Glucose Monitoring Suppl DEVI 1 each by Does not apply route 2 (two) times daily. May substitute to any manufacturer covered by patient's insurance.   cariprazine (VRAYLAR) 3 MG capsule Take 3 mg by mouth daily.   colchicine 0.6 MG tablet Take 1 tablet (0.6 mg total) by mouth daily.   cyclobenzaprine (FLEXERIL) 5 MG tablet Take 1 tablet (5 mg total) by mouth 3 (three) times daily as needed. for muscle spams   DULoxetine (CYMBALTA) 20 MG capsule Take 1 capsule (20 mg total) by mouth daily.   ENTRESTO 49-51 MG TAKE 1 TABLET BY MOUTH TWICE DAILY **  APPOINTMENT  REQUIRED  FOR  FUTURE  REFILLS**   EQ ALLERGY RELIEF, CETIRIZINE, 10 MG tablet Take 1 tablet by mouth once daily    gabapentin (NEURONTIN) 300 MG capsule Take 3 capsules (900 mg total) by mouth 3 (three) times daily.   Glucose Blood (BLOOD GLUCOSE TEST STRIPS) STRP 1 each by In Vitro route 2 (two) times daily. May substitute to any manufacturer covered by patient's insurance.   ibuprofen (ADVIL) 800 MG tablet Take 800 mg by mouth every 8 (eight) hours as needed for moderate pain.   LINZESS 290 MCG CAPS capsule TAKE 1 CAPSULE BY MOUTH ONCE DAILY BEFORE BREAKFAST   metolazone (ZAROXOLYN) 2.5 MG tablet Take 1 tablet (2.5 mg total) by mouth as directed. BY HEART FAILURE CLINIC   naloxone (NARCAN) nasal spray 4 mg/0.1 mL Place 1 spray into the nose as needed.   omeprazole (PRILOSEC) 20 MG capsule Take 1 capsule (20 mg total) by mouth at bedtime.   oxyCODONE-acetaminophen (PERCOCET) 10-325 MG tablet Take 1 tablet by mouth every 4 (four) hours as needed for pain.   potassium chloride SA (KLOR-CON M) 20 MEQ tablet Take 1 tablet (20 mEq total) by mouth daily. TAKE AN EXTRA (2) TABLETS ON DAYS THAT YOU TAKE METOLAZONE 2.5MG    promethazine (PHENERGAN) 25 MG tablet Take 25 mg by mouth every 8 (eight) hours as needed for nausea or vomiting.   Semaglutide, 2 MG/DOSE, (OZEMPIC, 2 MG/DOSE,) 8 MG/3ML SOPN Inject 2 mg into the skin once a week.   spironolactone (ALDACTONE) 25 MG tablet Take 1 tablet (25 mg total) by mouth daily.   torsemide (DEMADEX) 20 MG tablet Take 3 tablets (60 mg total) by mouth 2 (two) times daily.   No facility-administered encounter medications on file as of 10/07/2023.    Review of Systems  Review of Systems  Constitutional: Negative.   HENT: Negative.    Cardiovascular: Negative.   Gastrointestinal: Negative.   Allergic/Immunologic: Negative.   Neurological: Negative.   Psychiatric/Behavioral: Negative.       Objective:   BP 107/61   Pulse 100   Temp 98.1 F (36.7 C) (Oral)   Wt (!) 436 lb (197.8 kg)   LMP 02/13/2014   SpO2 100%   BMI 72.55 kg/m   Wt Readings from Last 5  Encounters:  10/07/23 (!) 436 lb (197.8 kg)  09/16/23 (!) 436 lb 3.2 oz (197.9 kg)  07/24/23 (!) 416 lb (188.7 kg)  05/19/23 (!) 436 lb (197.8 kg)  05/01/23 (!) 424 lb 6.4 oz (192.5 kg)     Physical Exam Vitals and nursing note reviewed.  Constitutional:      General: She is not in acute distress.    Appearance: She is well-developed.  Cardiovascular:     Rate and Rhythm: Normal rate and regular rhythm.  Pulmonary:     Effort: Pulmonary effort is normal.  Breath sounds: Normal breath sounds.  Neurological:     Mental Status: She is alert and oriented to person, place, and time.       Assessment & Plan:   Type 2 diabetes mellitus without complication, unspecified whether long term insulin use (HCC) -     POCT glycosylated hemoglobin (Hb A1C) -     AMB Referral [facility] Care Management  Chronic pain syndrome -     DULoxetine HCl; Take 1 capsule (20 mg total) by mouth daily.  Dispense: 30 capsule; Refill: 3     Return in about 3 months (around 01/06/2024).    Jerrlyn Morel, NP 10/07/2023

## 2023-10-07 NOTE — Patient Instructions (Signed)
 1. Type 2 diabetes mellitus without complication, unspecified whether long term insulin use (HCC) (Primary)  - POCT glycosylated hemoglobin (Hb A1C) - AMB Referral [facility] Care Management  2. Chronic pain syndrome  - DULoxetine (CYMBALTA) 20 MG capsule; Take 1 capsule (20 mg total) by mouth daily.  Dispense: 30 capsule; Refill: 3

## 2023-10-13 ENCOUNTER — Telehealth: Payer: Self-pay

## 2023-10-13 NOTE — Telephone Encounter (Signed)
 Pharmacy Patient Advocate Encounter  Received notification from [facility] that Prior Authorization for OZEMPIC  has been APPROVED from 10/08/2023 to 10/12/2024

## 2023-10-14 ENCOUNTER — Encounter (HOSPITAL_COMMUNITY): Payer: Self-pay

## 2023-10-14 ENCOUNTER — Ambulatory Visit (HOSPITAL_COMMUNITY)
Admission: RE | Admit: 2023-10-14 | Discharge: 2023-10-14 | Disposition: A | Discharge status: Home / Self Care | Payer: MEDICAID | Source: Ambulatory Visit | Attending: Family Medicine | Admitting: Family Medicine

## 2023-10-14 VITALS — BP 110/80 | HR 97 | Ht 65.0 in | Wt >= 6400 oz

## 2023-10-14 DIAGNOSIS — Z6841 Body Mass Index (BMI) 40.0 and over, adult: Secondary | ICD-10-CM | POA: Diagnosis not present

## 2023-10-14 DIAGNOSIS — J45909 Unspecified asthma, uncomplicated: Secondary | ICD-10-CM | POA: Insufficient documentation

## 2023-10-14 DIAGNOSIS — Z7985 Long-term (current) use of injectable non-insulin antidiabetic drugs: Secondary | ICD-10-CM | POA: Diagnosis not present

## 2023-10-14 DIAGNOSIS — Z833 Family history of diabetes mellitus: Secondary | ICD-10-CM | POA: Diagnosis not present

## 2023-10-14 DIAGNOSIS — G8929 Other chronic pain: Secondary | ICD-10-CM | POA: Diagnosis not present

## 2023-10-14 DIAGNOSIS — J9611 Chronic respiratory failure with hypoxia: Secondary | ICD-10-CM | POA: Diagnosis not present

## 2023-10-14 DIAGNOSIS — Z794 Long term (current) use of insulin: Secondary | ICD-10-CM | POA: Diagnosis not present

## 2023-10-14 DIAGNOSIS — Z8249 Family history of ischemic heart disease and other diseases of the circulatory system: Secondary | ICD-10-CM | POA: Diagnosis not present

## 2023-10-14 DIAGNOSIS — M25569 Pain in unspecified knee: Secondary | ICD-10-CM | POA: Diagnosis not present

## 2023-10-14 DIAGNOSIS — I110 Hypertensive heart disease with heart failure: Secondary | ICD-10-CM | POA: Insufficient documentation

## 2023-10-14 DIAGNOSIS — E119 Type 2 diabetes mellitus without complications: Secondary | ICD-10-CM | POA: Insufficient documentation

## 2023-10-14 DIAGNOSIS — Z79899 Other long term (current) drug therapy: Secondary | ICD-10-CM | POA: Diagnosis not present

## 2023-10-14 DIAGNOSIS — I10 Essential (primary) hypertension: Secondary | ICD-10-CM

## 2023-10-14 DIAGNOSIS — E785 Hyperlipidemia, unspecified: Secondary | ICD-10-CM | POA: Diagnosis not present

## 2023-10-14 DIAGNOSIS — I5032 Chronic diastolic (congestive) heart failure: Secondary | ICD-10-CM | POA: Insufficient documentation

## 2023-10-14 DIAGNOSIS — Z9981 Dependence on supplemental oxygen: Secondary | ICD-10-CM | POA: Insufficient documentation

## 2023-10-14 LAB — BASIC METABOLIC PANEL WITH GFR
Anion gap: 12 (ref 5–15)
BUN: 28 mg/dL — ABNORMAL HIGH (ref 6–20)
CO2: 28 mmol/L (ref 22–32)
Calcium: 9.3 mg/dL (ref 8.9–10.3)
Chloride: 96 mmol/L — ABNORMAL LOW (ref 98–111)
Creatinine, Ser: 1.48 mg/dL — ABNORMAL HIGH (ref 0.44–1.00)
GFR, Estimated: 42 mL/min — ABNORMAL LOW (ref >60)
Glucose, Bld: 86 mg/dL (ref 70–99)
Potassium: 4.2 mmol/L (ref 3.5–5.1)
Sodium: 136 mmol/L (ref 135–145)

## 2023-10-14 MED ORDER — METOLAZONE 2.5 MG PO TABS: 2.5000 mg | ORAL_TABLET | ORAL | 3 refills | Status: DC

## 2023-10-14 NOTE — Progress Notes (Signed)
 ADVANCED HF CLINIC NOTE   PCP: Jerrlyn Morel, NP Pulmonology: Dr. Fulton Job Primary Cardiologist: Dr Filiberto Hug  HF Cardiologist: Dr. Julane Ny  HPI: Cynthia Becker is a 53 y.o. female w/ a history of chronic diastolic CHF, HTN, morbid obesity, type II DM, HLD tobacco use, asthma, chronic respiratory failure with hypoxia on supplemental O2.    She's had multiple admissions over the last few years with a/c respiratory failure with hypoxia secondary to a/c diastolic CHF.    Echo 4/22: EF 60-65%, RV not well visualized, study technically difficult d/t body habitus.   Admitted 12/22 with a/c respiratory failure with hypoxia secondary to influenza A pneumonia and a/c CHF. Diuresed with IV lasix . Discharged on torsemide  40 mg daily. Course complicated by encephalopathy, felt to be likely d/t CO2 narcosis vs narcotic use.   Follow up 12/22 with PCP, 20 lbs weight gain. Diuretics increased and referred to [facility] for evaluation.  Initial AHF clinic visit 3/23, markedly volume overloaded, weight 464 lbs. Spiro 12.5 added, and instructed to take metolazone  2x/week. Zio-14 day placed at follow up due to palpitations.  Used Furoscix  x 3 doses 7/21-7/23/23    Today she returns for HF follow up with her family. Overall feeling fine. Walks around the house a little, limited by chronic knee pain. She is mildly SOB with ADLs. Swelling has improved with metolazone  use. Rare palpitations. Denies abnormal bleeding, CP, dizziness, or PND/Orthopnea. She chronically sleeps elevated. Appetite ok. No fever or chills. Weight at home 420 pounds. Taking all medications. She wears 2L oxygen at night. Down 50 lbs with Ozempic .   Cardiac Studies: - Echo (1023): EF 60-65% RV ok - Echo (4/22): EF 60-65%, RV not well visualized, study technically difficult d/t body habitus. - Echo (11/21): EF 60-65%, moderate LVH, grade II DD, RV normal - Echo (2/21): EF 50-55%, grade II DD, moderate LVH,  technically difficult study.  ROS: All systems reviewed and negative except as per HPI.   Past Medical History:  Diagnosis Date   Anemia    iv iron treatment dec 2014/ see oncology for this last in dec 2014   Anxiety    Arthritis    knee, hands   Asthma    seasonal related/ albuterol  used when uri   CHF (congestive heart failure) (HCC)    CN (constipation) 09/14/2014   DEGENERATIVE DISC DISEASE, LUMBOSACRAL SPINE W/RADICULOPATHY 11/01/2009   Qualifier: Diagnosis of  By: Earle Glatter, Scott     Depression    Diabetes (HCC)    H/O dizziness    Hx   Headache(784.0)    otc med prn   High blood pressure    HYPERLIPIDEMIA 02/13/2010   Qualifier: Diagnosis of  By: Billey Budds     INSOMNIA 08/24/2008   Qualifier: Diagnosis of  By: Katheleen Palmer MD, [place]     Morbid obesity (HCC) 05/24/2008   Qualifier: Diagnosis of  By: Katheleen Palmer MD, [place]     SVD (spontaneous vaginal delivery)    x 3   Current Outpatient Medications  Medication Sig Dispense Refill   ACCU-CHEK FASTCLIX LANCETS MISC USE TO CHECK BLOOD SUGAR TWICE DAILY  11   ACCU-CHEK GUIDE test strip USE 1 STRIP TO CHECK BLOOD SUGAR THREE TIMES DAILY 250 each 0   ACCU-CHEK GUIDE TEST test strip USE 1 STRIP TO CHECK GLUCOSE TWICE DAILY 100 each 0   albuterol  (PROVENTIL ) (2.5 MG/3ML) 0.083% nebulizer solution USE 1 VIAL IN NEBULIZER EVERY 6 HOURS AS NEEDED FOR WHEEZING  OR SHORTNESS OF BREATH 150 mL 1   allopurinol  (ZYLOPRIM ) 100 MG tablet Take 1 tablet (100 mg total) by mouth daily. 30 tablet 2   blood glucose meter kit and supplies KIT Dispense based on patient and insurance preference. Use up to four times daily as directed. 1 each 0   Blood Glucose Monitoring Suppl DEVI 1 each by Does not apply route 2 (two) times daily. May substitute to any manufacturer covered by patient's insurance. 1 each 0   cariprazine (VRAYLAR) 3 MG capsule Take 3 mg by mouth daily.     colchicine  0.6 MG tablet Take 1 tablet (0.6 mg total) by mouth daily. 30  tablet 0   cyclobenzaprine  (FLEXERIL ) 5 MG tablet Take 1 tablet (5 mg total) by mouth 3 (three) times daily as needed. for muscle spams 90 tablet 0   DULoxetine  (CYMBALTA ) 20 MG capsule Take 1 capsule (20 mg total) by mouth daily. 30 capsule 3   ENTRESTO  49-51 MG TAKE 1 TABLET BY MOUTH TWICE DAILY **  APPOINTMENT  REQUIRED  FOR  FUTURE  REFILLS** 60 tablet 2   EQ ALLERGY RELIEF, CETIRIZINE , 10 MG tablet Take 1 tablet by mouth once daily 90 tablet 0   gabapentin  (NEURONTIN ) 300 MG capsule Take 3 capsules (900 mg total) by mouth 3 (three) times daily. (Patient taking differently: Take 900 mg by mouth 3 (three) times daily. Taking 900 mg once daily) 270 capsule 2   Glucose Blood (BLOOD GLUCOSE TEST STRIPS) STRP 1 each by In Vitro route 2 (two) times daily. May substitute to any manufacturer covered by patient's insurance. 100 each 0   ibuprofen  (ADVIL ) 800 MG tablet Take 800 mg by mouth every 8 (eight) hours as needed for moderate pain.     LINZESS  290 MCG CAPS capsule TAKE 1 CAPSULE BY MOUTH ONCE DAILY BEFORE BREAKFAST 30 capsule 0   metolazone  (ZAROXOLYN ) 2.5 MG tablet Take 1 tablet (2.5 mg total) by mouth as directed. BY HEART FAILURE CLINIC 10 tablet 0   omeprazole  (PRILOSEC) 20 MG capsule Take 1 capsule (20 mg total) by mouth at bedtime. 90 capsule 0   oxyCODONE -acetaminophen  (PERCOCET) 10-325 MG tablet Take 1 tablet by mouth every 4 (four) hours as needed for pain.     potassium chloride  SA (KLOR-CON  M) 20 MEQ tablet Take 1 tablet (20 mEq total) by mouth daily. TAKE AN EXTRA (2) TABLETS ON DAYS THAT YOU TAKE METOLAZONE  2.5MG  30 tablet 11   promethazine  (PHENERGAN ) 25 MG tablet Take 25 mg by mouth every 8 (eight) hours as needed for nausea or vomiting.     Semaglutide , 2 MG/DOSE, (OZEMPIC , 2 MG/DOSE,) 8 MG/3ML SOPN Inject 2 mg into the skin once a week. 3 mL 11   spironolactone  (ALDACTONE ) 25 MG tablet Take 1 tablet (25 mg total) by mouth daily. 90 tablet 3   torsemide  (DEMADEX ) 20 MG tablet  Take 3 tablets (60 mg total) by mouth 2 (two) times daily. 565 tablet 2   naloxone  (NARCAN ) nasal spray 4 mg/0.1 mL Place 1 spray into the nose as needed. (Patient not taking: Reported on 10/14/2023)     No current facility-administered medications for this encounter.   No Known Allergies  Social History   Socioeconomic History   Marital status: Married    Spouse name: Not on file   Number of children: 3   Years of education: Not on file   Highest education level: Not on file  Occupational History   Not on file  Tobacco Use   Smoking status: Former    Current packs/day: 0.00    Types: Cigarettes    Quit date: 04/24/2020    Years since quitting: 3.4   Smokeless tobacco: Never   Tobacco comments:    4-5 cigarettes a day  Vaping Use   Vaping status: Never Used  Substance and Sexual Activity   Alcohol use: No   Drug use: No   Sexual activity: Not on file  Other Topics Concern   Not on file  Social History Narrative   Not on file   Social Drivers of Health   Financial Resource Strain: Not on file  Food Insecurity: No Food Insecurity (10/07/2023)   Hunger Vital Sign    Worried About Running Out of Food in the Last Year: Never true    Ran Out of Food in the Last Year: Never true  Transportation Needs: No Transportation Needs (10/07/2023)   PRAPARE - Administrator, Civil Service (Medical): No    Lack of Transportation (Non-Medical): No  Physical Activity: Not on file  Stress: Not on file  Social Connections: Not on file  Intimate Partner Violence: Not on file   Family History  Problem Relation Age of Onset   Diabetes Mother    Hypertension Father    Heart disease Other    Diabetes Other    Hypertension Other    Alcohol abuse Other    Hypertension Brother    Asthma Brother    Hypertension Brother    Asthma Brother    COPD Maternal Grandmother    Lung cancer Maternal Grandmother    Asthma Paternal Grandmother    Asthma Paternal Aunt    Wt Readings  from Last 3 Encounters:  10/14/23 (!) 189.8 kg (418 lb 6.4 oz)  10/07/23 (!) 197.8 kg (436 lb)  09/16/23 (!) 197.9 kg (436 lb 3.2 oz)   BP 110/80   Pulse 97   Ht 5\' 5"  (1.651 m)   Wt (!) 189.8 kg (418 lb 6.4 oz)   LMP 02/13/2014   SpO2 100%   BMI 69.63 kg/m   PHYSICAL EXAM: General:  NAD. No resp difficulty, arrived in [facility] HEENT: Normal Neck: Supple. No JVD. Thick neck Cor: Regular rate & rhythm. No rubs, gallops or murmurs. Lungs: Clear Abdomen: Soft, obese, nontender, nondistended.  Extremities: No cyanosis, clubbing, rash, edema Neuro: Alert & oriented x 3, moves all 4 extremities w/o difficulty. Affect pleasant.   ASSESSMENT & PLAN: 1. Chronic HFpEF  - Echo (4/22): EF 60-65%, RV poorly visualized. - Echo 10/23 EF 60-65% RV ok  - NYHA III-IIIb, confounded by super morbid obesity and deconditioning. Volume looks OK today with metolazone  use - Add metolazone  2.5 mg + 40 KCL once a week  - Continue torsemide  80/60 - Continue spironolactone  25 mg daily.  - Continue Entresto  49/51 bid. - No SGLT2i due hygeine  - Labs today, repeat BMET in 2 weeks - Update echo   2. HTN - BP stable. - Meds as above   3. Morbid Obesity - Body mass index is 69.63 kg/m. - on Ozempic , lost 50 lbs so far. congratulated   4. DM II - Followed by PCP. - On insulin . - Continue Ozempic    Follow up in 6 months with Dr. Julane Ny, update echo.  Arlice Bene Han Lysne FNP-BC 3:57 PM

## 2023-10-14 NOTE — Patient Instructions (Addendum)
 Take metolazone  2.5 mg once weekly - updated Rx sent. Labs today - will call you if abnormal. Repeat labs in 2 weeks - see below.  We have ordered an echo for you and will call you to schedule when prior authorization is obtained. Return to see Dr. Julane Ny in 6 months. CALL US  AT [phone] IN SEPTEMBER TO SCHEDULE THIS APPOINTMENT.  Please call us  at [phone] if any questions or concerns prior to your next appointment.

## 2023-10-21 ENCOUNTER — Ambulatory Visit: Payer: MEDICAID | Admitting: Sports Medicine

## 2023-10-22 ENCOUNTER — Telehealth: Payer: Self-pay | Admitting: *Deleted

## 2023-10-22 NOTE — Progress Notes (Signed)
 Care Guide Pharmacy Note  10/22/2023 Name: Cynthia Becker MRN: [id number] DOB: [birth year]  Referred By: Jerrlyn Morel, NP Reason for referral: Complex Care Management (Initial outreach to schedule with PharmD )   Cynthia Becker is a 53 y.o. year old female who is a primary care patient of Jerrlyn Morel, NP.  Cynthia Becker was referred to the pharmacist for assistance related to: DMII  An unsuccessful telephone outreach was attempted today to contact the patient who was referred to the pharmacy team for assistance with medication management. Additional attempts will be made to contact the patient.  Cynthia Becker  [facility]  [facility], [facility] Guide  Direct Dial: [phone]  Fax [phone]

## 2023-10-23 NOTE — Progress Notes (Signed)
 Care Guide Pharmacy Note  10/23/2023 Name: [name] MRN: [id number] DOB: [birth year]  Referred By: Jerrlyn Morel, NP Reason for referral: Complex Care Management (Initial outreach to schedule with PharmD )   Cynthia Becker is a 53 y.o. year old female who is a primary care patient of Jerrlyn Morel, NP.  Layelle Vanalstine was referred to the pharmacist for assistance related to: DMII  Successful contact was made with the patient to discuss pharmacy services including being ready for the pharmacist to call at least 5 minutes before the scheduled appointment time and to have medication bottles and any blood pressure readings ready for review. The patient agreed to meet with the pharmacist via telephone visit on (date/time). 9:00 Am on 11/11/23  [facility]  [facility], [facility] Guide  Direct Dial: [phone]  Fax [phone]

## 2023-10-28 ENCOUNTER — Other Ambulatory Visit (HOSPITAL_COMMUNITY): Payer: MEDICAID

## 2023-10-29 ENCOUNTER — Ambulatory Visit (HOSPITAL_COMMUNITY)
Admission: RE | Admit: 2023-10-29 | Discharge: 2023-10-29 | Disposition: A | Discharge status: Home / Self Care | Payer: MEDICAID | Source: Ambulatory Visit | Attending: Internal Medicine | Admitting: Internal Medicine

## 2023-10-29 DIAGNOSIS — I5032 Chronic diastolic (congestive) heart failure: Secondary | ICD-10-CM | POA: Diagnosis present

## 2023-10-29 LAB — BASIC METABOLIC PANEL WITH GFR
Anion gap: 14 (ref 5–15)
BUN: 29 mg/dL — ABNORMAL HIGH (ref 6–20)
CO2: 25 mmol/L (ref 22–32)
Calcium: 9.4 mg/dL (ref 8.9–10.3)
Chloride: 96 mmol/L — ABNORMAL LOW (ref 98–111)
Creatinine, Ser: 1.42 mg/dL — ABNORMAL HIGH (ref 0.44–1.00)
GFR, Estimated: 44 mL/min — ABNORMAL LOW (ref >60)
Glucose, Bld: 91 mg/dL (ref 70–99)
Potassium: 3.5 mmol/L (ref 3.5–5.1)
Sodium: 135 mmol/L (ref 135–145)

## 2023-11-03 ENCOUNTER — Ambulatory Visit: Payer: MEDICAID | Admitting: Podiatry

## 2023-11-04 ENCOUNTER — Encounter: Payer: Self-pay | Admitting: Sports Medicine

## 2023-11-04 ENCOUNTER — Other Ambulatory Visit: Payer: Self-pay

## 2023-11-04 ENCOUNTER — Ambulatory Visit: Payer: MEDICAID | Admitting: Sports Medicine

## 2023-11-04 DIAGNOSIS — E0843 Diabetes mellitus due to underlying condition with diabetic autonomic (poly)neuropathy: Secondary | ICD-10-CM | POA: Diagnosis not present

## 2023-11-04 DIAGNOSIS — M170 Bilateral primary osteoarthritis of knee: Secondary | ICD-10-CM | POA: Diagnosis not present

## 2023-11-04 DIAGNOSIS — G8929 Other chronic pain: Secondary | ICD-10-CM

## 2023-11-04 DIAGNOSIS — M25562 Pain in left knee: Secondary | ICD-10-CM

## 2023-11-04 MED ORDER — LIDOCAINE HCL 1 % IJ SOLN
2.0000 mL | INTRAMUSCULAR | Status: AC | PRN
  Administered 2023-11-04: 2 mL

## 2023-11-04 MED ORDER — METHYLPREDNISOLONE ACETATE 40 MG/ML IJ SUSP
40.0000 mg | INTRAMUSCULAR | Status: AC | PRN
Start: 2023-11-04 — End: 2023-11-04
  Administered 2023-11-04: 40 mg via INTRA_ARTICULAR

## 2023-11-04 MED ORDER — BUPIVACAINE HCL 0.25 % IJ SOLN
2.0000 mL | INTRAMUSCULAR | Status: AC | PRN
  Administered 2023-11-04: 2 mL via INTRA_ARTICULAR

## 2023-11-04 NOTE — Progress Notes (Signed)
 Patient says that her right knee is doing very well since her injection. She says that the left knee is painful and she can feel that she is overdue for this injection today. She has an appointment to try aquatic therapy on 11/25/2023; she says she is nervous but is going to at least try it to see how it helps.

## 2023-11-04 NOTE — Progress Notes (Signed)
 Cynthia Becker - 53 y.o. female MRN [id number]  Date of birth: 08-04-1970  Office Visit Note: Visit Date: 11/04/2023 PCP: Jerrlyn Morel, NP Referred by: Jerrlyn Morel, NP  Subjective: Chief Complaint  Patient presents with   Left Knee - Pain   HPI: Cynthia Becker is a pleasant 53 y.o. female who presents today for bilateral knee osteoarthritis with acute on chronic left knee pain.   Cynthia Becker is having worsening of her left knee pain and arthritic change.  Back in early April we did inject her right knee under ultrasound guidance, this right knee continues to do very well.  She is interested in proceeding with injection for the left knee today.  She continues working on measures for weight loss and physical activity.  She has lost weight since last visit, most recent weight was 418 pounds.  She also is working on diuresis from her CHF which is helping reduce her weight.  She does have her first appointment to try aquatic-based physical therapy for her knees upcoming on 11/25/2023.   In terms of pain control,  she is managed on chronic oxycodone  10 mg every 4-6 hours as needed for pain control which she receives through pain management.  She also uses ibuprofen  800 mg as needed, but trying to limit this given her heart failure.  Pertinent ROS were reviewed with the patient and found to be negative unless otherwise specified above in HPI.   Assessment & Plan: Visit Diagnoses:  1. Chronic pain of left knee   2. Bilateral primary osteoarthritis of knee   3. Diabetes mellitus due to underlying condition with diabetic autonomic neuropathy, unspecified whether long term insulin  use (HCC)   4. Morbid obesity (HCC)    Plan: Impression is chronic advanced osteoarthritis of bilateral knees with an acute exacerbation of her left knee pain.  Through shared decision making, we did proceed with ultrasound-guided intra-articular injection, patient tolerated well.  Advised on postinjection protocol.  I  did commend her on her weight loss.  She will continue her Ozempic  2 mg weekly as well as her healthy lifestyle measures to bring down her BMI.  She does have her first upcoming appointment with aquatic-based physical therapy, which I believe will be helpful for her weight loss as well as mobility about both of her knee joints.  She will continue her chronic oxycodone  10 mg every 4-6 hours as needed which she receives through pain management.  She may continue using Tylenol , sparing ibuprofen  although will limit this given her CHF.  I do think she would be a good candidate for Zilretta  injection given her good relief but temporary relief from regular corticosteroid injections as well as her diabetes to help block the transient glucose rise.  We will send off for authorization of this.  I will follow-up with her in about 2 months to re-evaluate the knees.  The patient would benefit from a long-acting corticosteroid injection, I.e. Zilretta , as they have tried topical and oral anti-inflammatories such as OTC anti-inflammatories, Ibuprofen , Tylenol , Cochicine, currently on Percocet without much relief. The have undergone nonpharmacologic therapy with lifestyle modification, home exercise or physical therapy, and attempted weight loss and exercise regimens without significant relief. They have failed long-term benefit from previous corticosteroid injection therapy (with good relief but only temporary). She also is a type-II diabetic.  For these reasons, I do think they are an appropriate candidate for Zilretta  for bilateral knees.  We will send authorization through the insurance company, will notify patient  when this is approved and we will schedule in clinic for injection therapy at this time when works for the patient.   Follow-up: Return in about 2 months (around 01/04/2024) for For R > L knee  Meds & Orders: No orders of the defined types were placed in this encounter.   Orders Placed This Encounter   Procedures   Large Joint Inj   US  Guided Needle Placement - No Linked Charges     Procedures: Large Joint Inj: L knee on 11/04/2023 3:25 PM Indications: pain Details: 22 G 1.5 in and 3.5 in needle, ultrasound-guided superolateral approach Medications: 2 mL lidocaine  1 %; 2 mL bupivacaine  0.25 %; 40 mg methylPREDNISolone  acetate 40 MG/ML Outcome: tolerated well, no immediate complications  Procedure: Ultrasound-guided Knee Injection, Left After discussion on risk/benefits/indication, informed verbal consent was obtained. A timeout was then performed. The patient was lying in supine on examination table with a small bolster underneath the affected knee for comfort. The patient's knee was prepped with Betadine and alcohol swabs. Utilizing ultrasound-guidance with the probe in a transverse position, the patient's suprapatellar bursa was identified and the knee joint was subsequently injected intraarticularly with a mixture of 2:2:1 lidocaine :bupivicaine:depomedrol utilizing an in-plane visualization approach. Patient tolerated the procedure well without immediate complications.  Procedure, treatment alternatives, risks and benefits explained, specific risks discussed. Consent was given by the patient. Patient was prepped and draped in the usual sterile fashion.          Clinical History: No specialty comments available.  She reports that she quit smoking about 3 years ago. Her smoking use included cigarettes. She has never used smokeless tobacco.  Recent Labs    02/14/23 1617 05/19/23 1546 07/24/23 1553 08/18/23 1507 10/07/23 1118  HGBA1C 5.9* 5.8  --   --  5.8*  LABURIC  --   --  11.0* 8.9*  --     Objective:   Vital Signs: LMP 02/13/2014   Physical Exam  Gen: Well-appearing, in no acute distress; non-toxic CV: Well-perfused. Warm.  Resp: Breathing unlabored on room air; no wheezing. Psych: Fluid speech in conversation; appropriate affect; normal thought process  Ortho  Exam - Bilateral knees: Patient ambulates in a wheelchair.  There is no significant redness about the knees.  Trace effusion of the left knee.  There is bilateral patellar crepitus and medial and lateral joint line TTP today over the left knee.  Imaging:  *Review of bilateral knee x-ray shows tricompartmental arthritic change with advancing medial tibiofemoral joint space narrowing and OA.  Narrative & Impression  CLINICAL DATA:  bilateral knee pain   EXAM: Bilateral knees - four views   COMPARISON:  05/17/2021   FINDINGS: No evidence of fracture, dislocation, or joint effusion. There is bilateral tricompartmental joint space narrowing, sclerosis and osteophytes consistent with degenerative joint disease. No evidence of effusion.   IMPRESSION: Bilateral degenerative changes.     Electronically Signed   By: Sydell Eva M.D.   On: 11/15/2022 21:19      Past Medical/Family/Surgical/Social History: Medications & Allergies reviewed per EMR, new medications updated. Patient Active Problem List   Diagnosis Date Noted   Class 3 severe obesity due to excess calories with body mass index (BMI) greater than or equal to 70 in adult 02/20/2023   Chronic pain of both knees 11/08/2022   Osteoarthritis of both knees    Influenza A 05/17/2021   Flu 05/16/2021   Acute on chronic respiratory failure with hypoxia and hypercapnia (HCC) 04/17/2021  Abnormal CT of brain 04/17/2021   Polycythemia 04/17/2021   Chronic back pain 04/17/2021   Leukocytosis 04/17/2021   Episode of unresponsiveness 04/17/2021   Acute respiratory failure with hypoxia (HCC) 10/02/2020   Chronic diastolic CHF (congestive heart failure) (HCC) 04/29/2020   Essential hypertension 04/25/2020   Community acquired bacterial pneumonia 04/25/2020   Class 3 obesity 04/25/2020   Diabetes mellitus due to underlying condition with diabetic autonomic neuropathy, unspecified whether long term insulin  use (HCC) 04/24/2020    Acute respiratory failure (HCC) 04/24/2020   Asthmatic bronchitis 03/21/2020   Nocturnal hypoxia 03/21/2020   OSA (obstructive sleep apnea) 12/22/2019   Shortness of breath 08/09/2019   No-show for appointment 06/30/2019   Morbid obesity with BMI of 60.0-69.9, adult (HCC) 09/14/2014   Iron deficiency anemia due to chronic blood loss 09/14/2014   Constipation 09/14/2014   Postoperative state 04/12/2014   Uterine fibroid 03/17/2014   DUB (dysfunctional uterine bleeding) 01/27/2014   Iron deficiency anemia due to chronic blood loss 04/09/2013   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   Menometrorrhagia 04/09/2013   Left hand pain 09/18/2011   High blood pressure    ANEMIA, IRON DEFICIENCY 08/06/2010   CHOLECYSTECTOMY, HX OF 04/09/2010   Dyslipidemia 02/13/2010   DEGENERATIVE DISC DISEASE, LUMBOSACRAL SPINE W/RADICULOPATHY 11/01/2009   BACK PAIN, LUMBAR, WITH RADICULOPATHY 10/30/2009   TRIGGER FINGER 02/03/2009   Carpal tunnel syndrome 12/21/2008   Headache(784.0) 11/09/2008   Depression 10/04/2008   URI 08/24/2008   INSOMNIA 08/24/2008   Asthma 06/06/2008   Morbid obesity (HCC) 05/24/2008   ESSENTIAL HYPERTENSION 05/24/2008   Past Medical History:  Diagnosis Date   Anemia    iv iron treatment dec 2014/ see oncology for this last in dec 2014   Anxiety    Arthritis    knee, hands   Asthma    seasonal related/ albuterol  used when uri   CHF (congestive heart failure) (HCC)    CN (constipation) 09/14/2014   DEGENERATIVE DISC DISEASE, LUMBOSACRAL SPINE W/RADICULOPATHY 11/01/2009   Qualifier: Diagnosis of  By: Earle Glatter, Scott     Depression    Diabetes (HCC)    H/O dizziness    Hx   Headache(784.0)    otc med prn   High blood pressure    HYPERLIPIDEMIA 02/13/2010   Qualifier: Diagnosis of  By: Earle Glatter, Scott     INSOMNIA 08/24/2008   Qualifier: Diagnosis of  By: Katheleen Palmer MD, [place]     Morbid obesity (HCC) 05/24/2008   Qualifier: Diagnosis of  By: Katheleen Palmer MD, [place]      SVD (spontaneous vaginal delivery)    x 3   Family History  Problem Relation Age of Onset   Diabetes Mother    Hypertension Father    Heart disease Other    Diabetes Other    Hypertension Other    Alcohol abuse Other    Hypertension Brother    Asthma Brother    Hypertension Brother    Asthma Brother    COPD Maternal Grandmother    Lung cancer Maternal Grandmother    Asthma Paternal Grandmother    Asthma Paternal Aunt    Past Surgical History:  Procedure Laterality Date   ABDOMINAL HYSTERECTOMY  03/13/2014   total    BILATERAL SALPINGECTOMY Bilateral 04/12/2014   Procedure: BILATERAL SALPINGECTOMY;  Surgeon: Tresia Fruit, MD;  Location: [facility];  Service: Gynecology;  Laterality: Bilateral;   CARPAL TUNNEL RELEASE  08/30/2011   Procedure: CARPAL TUNNEL RELEASE;  Surgeon: Billye Buerger  Huntley Mai, MD;  Location: [facility];  Service: Orthopedics;  Laterality: Right;   CHOLECYSTECTOMY  10/11   lap choli   GALLBLADDER SURGERY  04/04/2010   HYSTEROSCOPY N/A 02/08/2014   Procedure: HYSTEROSCOPY;  Surgeon: Tresia Fruit, MD;  Location: [facility];  Service: Gynecology;  Laterality: N/A;   HYSTEROSCOPY WITH NOVASURE N/A 02/08/2014   Procedure: ATTEMPTED NOVASURE ABLATION;  Surgeon: Tresia Fruit, MD;  Location: [facility];  Service: Gynecology;  Laterality: N/A;   INTRAUTERINE DEVICE INSERTION     07/19/2013   SEPTOPLASTY N/A 09/08/2013   Procedure: SEPTOPLASTY;  Surgeon: Littie Rife, MD;  Location: [facility];  Service: ENT;  Laterality: N/A;   SPINE SURGERY     TONSILLECTOMY     TONSILLECTOMY AND ADENOIDECTOMY Bilateral 09/08/2013   Procedure: TONSILLECTOMY ;  Surgeon: Littie Rife, MD;  Location[facility];  Service: ENT;  Laterality: Bilateral;   TUBAL LIGATION     VAGINAL HYSTERECTOMY N/A 04/12/2014   Procedure: LAPAROSCOPIC ASSISTED VAGINAL HYSTERECTOMY;  Surgeon: Tresia Fruit, MD;  Location: [facility];  Service: Gynecology;  Laterality: N/A;   WISDOM TOOTH EXTRACTION     Social  History   Occupational History   Not on file  Tobacco Use   Smoking status: Former    Current packs/day: 0.00    Types: Cigarettes    Quit date: 04/24/2020    Years since quitting: 3.5   Smokeless tobacco: Never   Tobacco comments:    4-5 cigarettes a day  Vaping Use   Vaping status: Never Used  Substance and Sexual Activity   Alcohol use: No   Drug use: No   Sexual activity: Not on file

## 2023-11-11 ENCOUNTER — Other Ambulatory Visit: Payer: MEDICAID

## 2023-11-11 ENCOUNTER — Other Ambulatory Visit: Payer: Self-pay

## 2023-11-11 DIAGNOSIS — E0843 Diabetes mellitus due to underlying condition with diabetic autonomic (poly)neuropathy: Secondary | ICD-10-CM

## 2023-11-11 NOTE — Progress Notes (Signed)
 11/11/2023 Name: Cynthia Becker MRN: [id number] DOB: 03/05/1971  Chief Complaint  Patient presents with   Diabetes    Cynthia Becker is a 53 y.o. year old female who presented for a telephone visit.   They were referred to the pharmacist by their PCP for assistance in specifically managing Ozempic  dosing.    Subjective:  Care Team: Primary Care Provider: Jerrlyn Morel, NP ; Next Scheduled Visit: 01/07/2024   Medication Access/Adherence  Current Pharmacy:  [facility] [address], [place] - [address] [place] [address] Phone: [phone] Fax: [phone]   Patient reports affordability concerns with their medications: No  Patient reports access/transportation concerns to their pharmacy: No  Spouse takes patient to appointments Patient reports adherence concerns with their medications:  No   - Patient denies missing medications doses including medications identified on adherence portal which had low rates such as torsemide    Diabetes:  Current medications: Ozempic  2 mg weekly (takes on Sundays) with reported transient nausea for the first two days and by Wednesday the nausea is completely gone.  Medications tried in the past: metformin (intolerable side effect(s)), Lantus , glipizide   Using AccuChek BGM meter; testing two times daily   Current physical activity: Not at this time. Patient reports ortho setup aquatic-based physical therapy     Objective:  Lab Results  Component Value Date   HGBA1C 5.8 (A) 10/07/2023    Lab Results  Component Value Date   CREATININE 1.42 (H) 10/29/2023   BUN 29 (H) 10/29/2023   NA 135 10/29/2023   K 3.5 10/29/2023   CL 96 (L) 10/29/2023   CO2 25 10/29/2023    Lab Results  Component Value Date   CHOL 200 (H) 05/14/2021   HDL 41 05/14/2021   LDLCALC 141 (H) 05/14/2021   TRIG 99 05/14/2021   CHOLHDL 4.9 (H) 05/14/2021    Medications Reviewed Today     Reviewed by  Amedeo Bailiff, RPH (Pharmacist) on 11/11/23 at [id number]  Med List Status: <None>   Medication Order Taking? Sig Documenting Provider Last Dose Status Informant  ACCU-CHEK FASTCLIX LANCETS MISC [id number] Yes  [provider] Taking Active Spouse/Significant Other  ACCU-CHEK GUIDE TEST test strip [id number] Yes USE 1 STRIP TO CHECK GLUCOSE TWICE DAILY Jerrlyn Morel, NP Taking Active   albuterol  (PROVENTIL ) (2.5 MG/3ML) 0.083% nebulizer solution [id number] No USE 1 VIAL IN NEBULIZER EVERY 6 HOURS AS NEEDED FOR WHEEZING OR SHORTNESS OF BREATH  Patient not taking: Reported on 11/11/2023   Filbert Huff I, NP Not Taking Active   allopurinol  (ZYLOPRIM ) 100 MG tablet [id number] Yes Take 1 tablet (100 mg total) by mouth daily. Jerrlyn Morel, NP Taking Active   blood glucose meter kit and supplies KIT [id number] Yes Dispense based on patient and insurance preference. Use up to four times daily as directed. Jerrlyn Morel, NP Taking Active   Blood Glucose Monitoring Suppl DEVI [id number] Yes 1 each by Does not apply route 2 (two) times daily. May substitute to any manufacturer covered by patient's insurance. Jerrlyn Morel, NP Taking Active   cariprazine (VRAYLAR) 3 MG capsule [id number] Yes Take 3 mg by mouth daily. [provider] Taking Active   colchicine  0.6 MG tablet [id number] No Take 1 tablet (0.6 mg total) by mouth daily.  Patient not taking: Reported on 11/11/2023   Jerrlyn Morel, NP Not Taking Active   cyclobenzaprine  (FLEXERIL ) 10 MG tablet [id number] Yes Take 10 mg by  mouth 2 (two) times daily as needed for muscle spasms. [provider] Taking Active   DULoxetine  (CYMBALTA ) 20 MG capsule [id number] Yes Take 1 capsule (20 mg total) by mouth daily. Jerrlyn Morel, NP Taking Active   ENTRESTO  49-51 MG [id number] Yes TAKE 1 TABLET BY MOUTH TWICE DAILY **  APPOINTMENT  REQUIRED  FOR  FUTURE  REFILLS** Bensimhon, Rheta Celestine, MD Taking Active   EQ ALLERGY RELIEF,  CETIRIZINE , 10 MG tablet [id number] No Take 1 tablet by mouth once daily  Patient not taking: Reported on 11/11/2023   Jerrlyn Morel, NP Not Taking Active   gabapentin  (NEURONTIN ) 300 MG capsule [id number] Yes Take 3 capsules (900 mg total) by mouth 3 (three) times daily.  Patient taking differently: Take 900 mg by mouth 3 (three) times daily. Taking 900 mg once daily   [name], [name], NP Taking Active            Med Note Vallarie Gauze, [facility] R   Tue Nov 11, 2023  9:23 AM) Taking 300 mg nightly - Cymbalta  will replace medication and patient reports this is the last week of gabapentin   ibuprofen  (ADVIL ) 800 MG tablet [id number] Yes Take 800 mg by mouth every 8 (eight) hours as needed for moderate pain. [provider] Taking Active Spouse/Significant Other           Med Note Alexandria Angel R   Tue Nov 11, 2023  9:22 AM) Taking PRN  LINZESS  290 MCG CAPS capsule [id number] Yes TAKE 1 CAPSULE BY MOUTH ONCE DAILY BEFORE BREAKFAST Jerrlyn Morel, NP Taking Active   metolazone  (ZAROXOLYN ) 2.5 MG tablet [id number] No Take 1 tablet (2.5 mg total) by mouth once a week.  Patient not taking: Reported on 11/11/2023   Elmarie Hacking, FNP Not Taking Active   naloxone  (NARCAN ) nasal spray 4 mg/0.1 mL [id number] No Place 1 spray into the nose as needed.  Patient not taking: Reported on 10/14/2023   [provider] Not Taking Active Spouse/Significant Other  omeprazole  (PRILOSEC) 20 MG capsule [id number] No Take 1 capsule (20 mg total) by mouth at bedtime.  Patient not taking: Reported on 11/11/2023   Jerrlyn Morel, NP Not Taking Active   Oxycodone  HCl 10 MG TABS [id number] No Take 10 mg by mouth every 4 (four) hours.  Patient not taking: Reported on 11/11/2023   [provider] Not Taking Active   oxyCODONE -acetaminophen  (PERCOCET) 10-325 MG tablet [id number] Yes Take 1 tablet by mouth every 4 (four) hours as needed for pain. [provider] Taking Active   potassium chloride   SA (KLOR-CON  M) 20 MEQ tablet [id number] Yes Take 1 tablet (20 mEq total) by mouth daily. TAKE AN EXTRA (2) TABLETS ON DAYS THAT YOU TAKE METOLAZONE  2.5MG  Horace Lye, PA-C Taking Active   promethazine  (PHENERGAN ) 25 MG tablet [id number] Yes Take 25 mg by mouth every 8 (eight) hours as needed for nausea or vomiting. [provider] Taking Active Spouse/Significant Other  Semaglutide , 2 MG/DOSE, (OZEMPIC , 2 MG/DOSE,) 8 MG/3ML SOPN [id number] Yes Inject 2 mg into the skin once a week. Jerrlyn Morel, NP Taking Active            Med Note Alexandria Angel R   Tue Nov 11, 2023  9:26 AM) [place] Pear on Sunday  spironolactone  (ALDACTONE ) 25 MG tablet [id number] Yes Take 1 tablet (25 mg total) by mouth daily. Sheryl Donna, NP Taking Active   torsemide  (DEMADEX ) 20 MG tablet [id number] Yes Take  3 tablets (60 mg total) by mouth 2 (two) times daily.  Patient taking differently: Take 60 mg by mouth 2 (two) times daily. 80 mg qam and 60 mg q pm   Sheryl Donna, NP Taking Active   ZTLIDO  1.8 % [facility] [id number] Yes Apply 1 patch topically as needed (back pain). [provider] Taking Active               Assessment/Plan:   Diabetes: - Currently controlled while on monotherapy Ozempic  (max dose). Patient was congratulated on weight loss and hemoglobin A1c. Per chart review, Mounjaro previously discussed with patient but therapy was not initiated due to insurance step-wise criteria. I spoke with Radene Buffalo, CPhT who recently worked on prior authorization for Ozempic  and she stated that [facility]) requires patients to have failed 2 preferred (Byetta, Victoza, Trulicity, Ozempic ) agents prior to coverage. Informed patient of this criteria. Discussed with patient that given her diabetes is well controlled while being on Ozempic  and she is tolerating medication, it is unlikely she will be approved for Mounjaro. I called [facility], with Mrs. Law on the phone, and was  told what I previously discussed with patient. Failed therapy would be defined as uncontrolled diabetes or intolerance. Although the weight loss seems to plateau or is slowing, her hemoglobin A1c continues to remain well controlled.  She states her weight has to be ~300 lbs to qualify for knee surgery.   Discussed with patient Trulicity is not as effective in weight loss as Ozempic  and there is the potential for possible weight gain and slight change in hemoglobin A1c. As such, it would not be ideal to switch to Trulicity and patient agreed. Mrs. Porreca did inquire about adding Wegovy  to Ozempic  and I informed patient that is clinically inappropriate and educated patient that Wegovy  is FDA approved for weight loss only.   -Encouraged patient to attend physical therapy aquatics appointment for next month to help with weight loss and mobility.  - Encouraged patient to re-connect with trying to establish care with [facility]. Patient was provided the number and she is open to trying their services to help with her weight loss journey.  - Continue to adjust diet to improve weight loss. Future considerations for behavior therapy to help with patient's weight loss journey.  - Recommend to continue current regimen. Will discuss patient's interest of Mounjaro with PCP. Patient is completely aware that prior authorization will likely NOT be approved for Mounjaro but she would like to move forward in attempting to get therapy.  - Recommend to check glucose twice daily as she has been doing. However, she could reduce to once daily (fasting only) at the next appointment.       Obesity/Overweight: - Currently unable to achieve goal weight loss of 5-10% through diet and lifestyle modifications alone - Extensive dietary counseling including education on focus on lean proteins, fruits and vegetables, whole grains and increased fiber consumption, adequate hydration - Extensive exercise  counseling including eventual goal of 150 minutes of moderate intensity exercise weekly - Recommend to contact [facility] and provided telephone number.   Follow Up Plan: 6 weeks with PharmD  Alexandria Angel, PharmD Clinical Pharmacist Cell: [phone]

## 2023-11-17 ENCOUNTER — Other Ambulatory Visit: Payer: Self-pay | Admitting: Nurse Practitioner

## 2023-11-18 ENCOUNTER — Telehealth: Payer: Self-pay

## 2023-11-18 NOTE — Telephone Encounter (Signed)
 VOB has been submitted for Zilretta , bilateral knee.

## 2023-11-18 NOTE — Telephone Encounter (Signed)
-----   Message from [place] W sent at 11/04/2023  3:40 PM EDT ----- Regarding: Zilretta  - b/l knees Hey Draiden Mirsky,  Could we please send for prior authorization for this patient for Zilretta ? Bilateral knees.  Thanks, Lonzell Robin

## 2023-11-20 ENCOUNTER — Ambulatory Visit: Payer: MEDICAID | Admitting: Podiatry

## 2023-11-24 ENCOUNTER — Ambulatory Visit (INDEPENDENT_AMBULATORY_CARE_PROVIDER_SITE_OTHER): Payer: MEDICAID | Admitting: Podiatry

## 2023-11-24 ENCOUNTER — Encounter: Payer: Self-pay | Admitting: Podiatry

## 2023-11-24 DIAGNOSIS — B351 Tinea unguium: Secondary | ICD-10-CM

## 2023-11-24 DIAGNOSIS — M79675 Pain in left toe(s): Secondary | ICD-10-CM

## 2023-11-24 DIAGNOSIS — E0843 Diabetes mellitus due to underlying condition with diabetic autonomic (poly)neuropathy: Secondary | ICD-10-CM | POA: Diagnosis not present

## 2023-11-24 DIAGNOSIS — M79674 Pain in right toe(s): Secondary | ICD-10-CM

## 2023-11-24 NOTE — Therapy (Incomplete)
 OUTPATIENT PHYSICAL THERAPY LOWER EXTREMITY EVALUATION   Patient Name: Cynthia Becker MRN: [id number] DOB:[birth year], 53 y.o., female Today's Date: 11/24/2023  END OF SESSION:   Past Medical History:  Diagnosis Date   Anemia    iv iron treatment dec 2014/ see oncology for this last in dec 2014   Anxiety    Arthritis    knee, hands   Asthma    seasonal related/ albuterol  used when uri   CHF (congestive heart failure) (HCC)    CN (constipation) 09/14/2014   DEGENERATIVE DISC DISEASE, LUMBOSACRAL SPINE W/RADICULOPATHY 11/01/2009   Qualifier: Diagnosis of  By: Earle Glatter, Scott     Depression    Diabetes (HCC)    H/O dizziness    Hx   Headache(784.0)    otc med prn   High blood pressure    HYPERLIPIDEMIA 02/13/2010   Qualifier: Diagnosis of  By: Earle Glatter, Scott     INSOMNIA 08/24/2008   Qualifier: Diagnosis of  By: Katheleen Palmer MD, [place]     Morbid obesity (HCC) 05/24/2008   Qualifier: Diagnosis of  By: Katheleen Palmer MD, [place]     SVD (spontaneous vaginal delivery)    x 3   Past Surgical History:  Procedure Laterality Date   ABDOMINAL HYSTERECTOMY  03/13/2014   total    BILATERAL SALPINGECTOMY Bilateral 04/12/2014   Procedure: BILATERAL SALPINGECTOMY;  Surgeon: Tresia Fruit, MD;  Location: [facility];  Service: Gynecology;  Laterality: Bilateral;   CARPAL TUNNEL RELEASE  08/30/2011   Procedure: CARPAL TUNNEL RELEASE;  Surgeon: Kemp Patter, MD;  Location: [facility];  Service: Orthopedics;  Laterality: Right;   CHOLECYSTECTOMY  10/11   lap choli   GALLBLADDER SURGERY  04/04/2010   HYSTEROSCOPY N/A 02/08/2014   Procedure: HYSTEROSCOPY;  Surgeon: Tresia Fruit, MD;  Location: [facility];  Service: Gynecology;  Laterality: N/A;   HYSTEROSCOPY WITH NOVASURE N/A 02/08/2014   Procedure: ATTEMPTED NOVASURE ABLATION;  Surgeon: Tresia Fruit, MD;  Location: [facility];  Service: Gynecology;  Laterality: N/A;   INTRAUTERINE DEVICE INSERTION     07/19/2013   SEPTOPLASTY N/A  09/08/2013   Procedure: SEPTOPLASTY;  Surgeon: Littie Rife, MD;  Location[facility];  Service: ENT;  Laterality: N/A;   SPINE SURGERY     TONSILLECTOMY     TONSILLECTOMY AND ADENOIDECTOMY Bilateral 09/08/2013   Procedure: TONSILLECTOMY ;  Surgeon: Littie Rife, MD;  Location: [facility] OR;  Service: ENT;  Laterality: Bilateral;   TUBAL LIGATION     VAGINAL HYSTERECTOMY N/A 04/12/2014   Procedure: LAPAROSCOPIC ASSISTED VAGINAL HYSTERECTOMY;  Surgeon: Tresia Fruit, MD;  Location: [facility];  Service: Gynecology;  Laterality: N/A;   WISDOM TOOTH EXTRACTION     Patient Active Problem List   Diagnosis Date Noted   Class 3 severe obesity due to excess calories with body mass index (BMI) greater than or equal to 70 in adult 02/20/2023   Chronic pain of both knees 11/08/2022   Osteoarthritis of both knees    Influenza A 05/17/2021   Flu 05/16/2021   Acute on chronic respiratory failure with hypoxia and hypercapnia (HCC) 04/17/2021   Abnormal CT of brain 04/17/2021   Polycythemia 04/17/2021   Chronic back pain 04/17/2021   Leukocytosis 04/17/2021   Episode of unresponsiveness 04/17/2021   Acute respiratory failure with hypoxia (HCC) 10/02/2020   Chronic diastolic CHF (congestive heart failure) (HCC) 04/29/2020   Essential hypertension 04/25/2020   Community acquired bacterial pneumonia 04/25/2020   Class 3 obesity 04/25/2020  Diabetes mellitus due to underlying condition with diabetic autonomic neuropathy, unspecified whether long term insulin  use (HCC) 04/24/2020   Acute respiratory failure (HCC) 04/24/2020   Asthmatic bronchitis 03/21/2020   Nocturnal hypoxia 03/21/2020   OSA (obstructive sleep apnea) 12/22/2019   Shortness of breath 08/09/2019   No-show for appointment 06/30/2019   Morbid obesity with BMI of 60.0-69.9, adult (HCC) 09/14/2014   Iron deficiency anemia due to chronic blood loss 09/14/2014   Constipation 09/14/2014   Postoperative state 04/12/2014   Uterine fibroid 03/17/2014    DUB (dysfunctional uterine bleeding) 01/27/2014   Iron deficiency anemia due to chronic blood loss 04/09/2013   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   Menometrorrhagia 04/09/2013   Left hand pain 09/18/2011   High blood pressure    ANEMIA, IRON DEFICIENCY 08/06/2010   CHOLECYSTECTOMY, HX OF 04/09/2010   Dyslipidemia 02/13/2010   DEGENERATIVE DISC DISEASE, LUMBOSACRAL SPINE W/RADICULOPATHY 11/01/2009   BACK PAIN, LUMBAR, WITH RADICULOPATHY 10/30/2009   TRIGGER FINGER 02/03/2009   Carpal tunnel syndrome 12/21/2008   Headache(784.0) 11/09/2008   Depression 10/04/2008   URI 08/24/2008   INSOMNIA 08/24/2008   Asthma 06/06/2008   Morbid obesity (HCC) 05/24/2008   ESSENTIAL HYPERTENSION 05/24/2008    PCP: Abbey Hobby NP  REFERRING PROVIDER: Shauna Del MD  REFERRING DIAG: M17.0 (ICD-10-CM) - Bilateral primary osteoarthritis of knee  THERAPY DIAG:  No diagnosis found.  Rationale for Evaluation and Treatment: Rehabilitation  ONSET DATE: ***  SUBJECTIVE:   SUBJECTIVE STATEMENT: ***  PERTINENT HISTORY: Orders:  10/06/23: Right knee ultrasound-guided intra-articular injection  CHF with fluid retention Type II DM PAIN:  Are you having pain? Yes: NPRS scale: *** Pain location: *** Pain description: *** Aggravating factors: *** Relieving factors: ***  PRECAUTIONS: {Therapy precautions:24002}  RED FLAGS: {PT Red Flags:29287}   WEIGHT BEARING RESTRICTIONS: No  FALLS:  Has patient fallen in last 6 months? {fallsyesno:27318}  LIVING ENVIRONMENT: Lives with: {OPRC lives with:25569::"lives with their family"} Lives in: {Lives in:25570} Stairs: {opstairs:27293} Has following equipment at home: {Assistive devices:23999}  OCCUPATION: ***  PLOF: {PLOF:24004}  PATIENT GOALS: ***  NEXT MD VISIT: ***  OBJECTIVE:  Note: Objective measures were completed at Evaluation unless otherwise noted.  DIAGNOSTIC FINDINGS:  Bilateral primary osteoarthritis of knee  PATIENT  SURVEYS:  LEFS  COGNITION: Overall cognitive status: {cognition:24006}     SENSATION: {sensation:27233}  EDEMA:  {edema:24020}  MUSCLE LENGTH: Hamstrings: Right *** deg; Left *** deg   POSTURE: {posture:25561}  PALPATION: ***  LOWER EXTREMITY ROM:  {AROM/PROM:27142} ROM Right eval Left eval  Hip flexion    Hip extension    Hip abduction    Hip adduction    Hip internal rotation    Hip external rotation    Knee flexion    Knee extension    Ankle dorsiflexion    Ankle plantarflexion    Ankle inversion    Ankle eversion     (Blank rows = not tested)  LOWER EXTREMITY MMT:  MMT Right eval Left eval  Hip flexion    Hip extension    Hip abduction    Hip adduction    Hip internal rotation    Hip external rotation    Knee flexion    Knee extension    Ankle dorsiflexion    Ankle plantarflexion    Ankle inversion    Ankle eversion     (Blank rows = not tested)  LOWER EXTREMITY SPECIAL TESTS:  {LEspecialtests:26242}  FUNCTIONAL TESTS:  5 times sit to stand: ***  Timed up and go (TUG): ***  GAIT: Distance walked: *** Assistive device utilized: {Assistive devices:23999} Level of assistance: {Levels of assistance:24026} Comments: ***                                                                                                                                TREATMENT  Eval Self care:Posture and Optometrist handout and instruction    PATIENT EDUCATION:  Education details: Discussed eval findings, rehab rationale, aquatic program progression/POC and pools in area. Patient is in agreement  Person educated: Patient Education method: Explanation Education comprehension: verbalized understanding  HOME EXERCISE PROGRAM: TBA  ASSESSMENT:  CLINICAL IMPRESSION: Patient is a 53 y.o. f who was seen today for physical therapy evaluation and treatment for Bilateral primary osteoarthritis of knee .   OBJECTIVE IMPAIRMENTS: {opptimpairments:25111}.    ACTIVITY LIMITATIONS: {activitylimitations:27494}  PARTICIPATION LIMITATIONS: {participationrestrictions:25113}  PERSONAL FACTORS: {Personal factors:25162} are also affecting patient's functional outcome.   REHAB POTENTIAL: Good  CLINICAL DECISION MAKING: Evolving/moderate complexity  EVALUATION COMPLEXITY: Low   GOALS: Goals reviewed with patient? Yes  SHORT TERM GOALS: Target date: *** *** Baseline: Goal status: INITIAL  2.  *** Baseline:  Goal status: INITIAL  3.  *** Baseline:  Goal status: INITIAL  4.  *** Baseline:  Goal status: INITIAL  5.  *** Baseline:  Goal status: INITIAL  6.  *** Baseline:  Goal status: INITIAL  LONG TERM GOALS: Target date: ***  *** Baseline:  Goal status: INITIAL  2.  *** Baseline:  Goal status: INITIAL  3.  *** Baseline:  Goal status: INITIAL  4.  *** Baseline:  Goal status: INITIAL  5.  *** Baseline:  Goal status: INITIAL  6.  *** Baseline:  Goal status: INITIAL   PLAN:  PT FREQUENCY: {rehab frequency:25116}  PT DURATION: {rehab duration:25117}  PLANNED INTERVENTIONS: 97164- PT Re-evaluation, 97110-Therapeutic exercises, 97530- Therapeutic activity, 97112- Neuromuscular re-education, 97535- Self Care, 40981- Manual therapy, [id number]- Gait training, [id number]- Aquatic Therapy, X9147- Electrical stimulation (unattended), 97033- Ionotophoresis 4mg /ml Dexamethasone , Patient/Family education, Balance training, Stair training, Taping, Dry Needling, Joint mobilization, DME instructions, Cryotherapy, and Moist heat  PLAN FOR NEXT SESSION: ***   Frankie Decari Duggar, PT 11/24/2023, 7:30 PM

## 2023-11-24 NOTE — Progress Notes (Signed)

## 2023-11-25 ENCOUNTER — Ambulatory Visit (HOSPITAL_BASED_OUTPATIENT_CLINIC_OR_DEPARTMENT_OTHER): Payer: MEDICAID | Admitting: Physical Therapy

## 2023-11-26 ENCOUNTER — Telehealth (HOSPITAL_COMMUNITY): Payer: Self-pay | Admitting: Family Medicine

## 2023-11-26 NOTE — Telephone Encounter (Signed)
 front office received message from Philicia B, CMA that this patient needs to be scheduled for an echo only. Left voice message on telephone to call back to get scheduled.

## 2023-12-08 ENCOUNTER — Telehealth: Payer: Self-pay

## 2023-12-08 ENCOUNTER — Institutional Professional Consult (permissible substitution): Payer: MEDICAID | Admitting: Nurse Practitioner

## 2023-12-08 NOTE — Telephone Encounter (Signed)
 Zilretta  will be obtained through [facility].  Once co-pay has been paid by patient through [facility], medication will be delivered.

## 2023-12-22 ENCOUNTER — Encounter (INDEPENDENT_AMBULATORY_CARE_PROVIDER_SITE_OTHER): Payer: Self-pay | Admitting: Adult Health

## 2023-12-22 ENCOUNTER — Ambulatory Visit (INDEPENDENT_AMBULATORY_CARE_PROVIDER_SITE_OTHER): Payer: MEDICAID | Admitting: Adult Health

## 2023-12-22 VITALS — BP 118/78 | HR 102 | Temp 98.3°F | Ht 63.0 in | Wt >= 6400 oz

## 2023-12-22 DIAGNOSIS — I5032 Chronic diastolic (congestive) heart failure: Secondary | ICD-10-CM | POA: Diagnosis not present

## 2023-12-22 DIAGNOSIS — G4733 Obstructive sleep apnea (adult) (pediatric): Secondary | ICD-10-CM | POA: Diagnosis not present

## 2023-12-22 DIAGNOSIS — E0843 Diabetes mellitus due to underlying condition with diabetic autonomic (poly)neuropathy: Secondary | ICD-10-CM

## 2023-12-22 DIAGNOSIS — Z7985 Long-term (current) use of injectable non-insulin antidiabetic drugs: Secondary | ICD-10-CM

## 2023-12-22 DIAGNOSIS — I10 Essential (primary) hypertension: Secondary | ICD-10-CM

## 2023-12-22 DIAGNOSIS — Z6841 Body Mass Index (BMI) 40.0 and over, adult: Secondary | ICD-10-CM

## 2023-12-22 DIAGNOSIS — I110 Hypertensive heart disease with heart failure: Secondary | ICD-10-CM

## 2023-12-22 NOTE — Progress Notes (Signed)
 Office: [phone]  /  Fax: [phone]   Initial Visit   Pt unable to stand on scale for bioempedence results to generate  Pt's husband and grandson at [facility] during OV- both very supportive of her weight loss efforts  Cynthia Becker was seen in clinic today to evaluate for obesity. She is interested in losing weight to improve overall health and reduce the risk of weight related complications. She presents today to review program treatment options, initial physical assessment, and evaluation.     She was referred by: PCP  When asked what else they would like to accomplish? She states: Adopt a healthier eating pattern and lifestyle, Improve energy levels and physical activity, Improve existing medical conditions, Reduce number of medications, Need to lose weight for bil total knee replacements, Improve quality of life, and Interval Goal to loss < 400 lbs  When asked how has your weight affected you? She states: Has affected self-esteem, Relationships, Contributed to medical problems, Contributed to orthopedic problems or mobility issues, Having fatigue, Having poor endurance, Problems with eating patterns, and Has affected mood   Weight history: sig weight gain after hysterectomy in 2015  Highest weight: 486 lbs  Some associated conditions: Hypertension, Hyperlipidemia, Prediabetes, Heart Failure, and Vitamin D Deficiency  Contributing factors: family history of obesity, disruption of circadian rhythm / sleep disordered breathing, consumption of processed foods, use of obesogenic medications: Psychotropic medications, moderate to high levels of stress, reduced physical activity, chronic skipping of meals, multiple weight loss attempts in the past, and need for convenient foods  Weight promoting medications identified: Psychotropic medications  Prior weight loss attempts: Low Carb and Balanced Plate / Portion Control  Current nutrition plan: Low-carb, High-protein, and Portion control /  smart choices  Current level of physical activity: None  Current or previous pharmacotherapy: GLP-1  Response to medication: 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   Past medical history includes:   Past Medical History:  Diagnosis Date   Anemia    iv iron treatment dec 2014/ see oncology for this last in dec 2014   Anxiety    Arthritis    knee, hands   Asthma    seasonal related/ albuterol  used when uri   CHF (congestive heart failure) (HCC)    CN (constipation) 09/14/2014   DEGENERATIVE DISC DISEASE, LUMBOSACRAL SPINE W/RADICULOPATHY 11/01/2009   Qualifier: Diagnosis of  By: Lelon RIGGERS, Scott     Depression    Diabetes (HCC)    H/O dizziness    Hx   Headache(784.0)    otc med prn   High blood pressure    HYPERLIPIDEMIA 02/13/2010   Qualifier: Diagnosis of  By: Lelon RIGGERS, Scott     INSOMNIA 08/24/2008   Qualifier: Diagnosis of  By: Loretha MD, [place]     Morbid obesity (HCC) 05/24/2008   Qualifier: Diagnosis of  By: Loretha MD, [place]     SVD (spontaneous vaginal delivery)    x 3     Objective    BP 118/78   Pulse (!) 102   Temp 98.3 F (36.8 C)   Ht 5' 3 (1.6 m)   Wt (!) 420 lb (190.5 kg)   LMP 02/13/2014   SpO2 98%   BMI 74.40 kg/m  She was weighed on the bioimpedance scale: Body mass index is 74.4 kg/m.  Body Fat%:?, Visceral Fat Rating:?, Weight trend over the last 12 months: Decreasing  General:  Alert, oriented and cooperative. Patient is in no acute distress.  Respiratory: Normal respiratory  effort, no problems with respiration noted   Gait: able to ambulate independently  Mental Status: Normal mood and affect. Normal behavior. Normal judgment and thought content.   DIAGNOSTIC DATA REVIEWED:  BMET    Component Value Date/Time   NA 135 10/29/2023 1539   NA 139 02/14/2023 1617   NA 137 04/07/2013 1508   K 3.5 10/29/2023 1539   K 3.8 04/07/2013 1508   CL 96 (L) 10/29/2023 1539   CO2 25 10/29/2023 1539   CO2 23 04/07/2013 1508   GLUCOSE 91  10/29/2023 1539   GLUCOSE 96 04/07/2013 1508   BUN 29 (H) 10/29/2023 1539   BUN 27 (H) 02/14/2023 1617   BUN 11.5 04/07/2013 1508   CREATININE 1.42 (H) 10/29/2023 1539   CREATININE 0.8 04/07/2013 1508   CALCIUM  9.4 10/29/2023 1539   CALCIUM  9.2 04/07/2013 1508   GFRNONAA 44 (L) 10/29/2023 1539   GFRAA 100 01/17/2020 1530   Lab Results  Component Value Date   HGBA1C 5.8 (A) 10/07/2023   HGBA1C 6.3 (A) 08/17/2018   No results found for: INSULIN  CBC    Component Value Date/Time   WBC 7.9 02/14/2023 1617   WBC 6.7 05/20/2021 0323   RBC 5.45 (H) 02/14/2023 1617   RBC 6.32 (H) 05/20/2021 0323   HGB 13.5 02/14/2023 1617   HGB 13.3 09/12/2014 1512   HCT 40.9 02/14/2023 1617   HCT 42.4 09/12/2014 1512   PLT 338 02/14/2023 1617   MCV 75 (L) 02/14/2023 1617   MCV 64.6 (L) 09/12/2014 1512   MCH 24.8 (L) 02/14/2023 1617   MCH 24.7 (L) 05/20/2021 0323   MCHC 33.0 02/14/2023 1617   MCHC 31.3 05/20/2021 0323   RDW 14.4 02/14/2023 1617   RDW 21.6 (H) 09/12/2014 1512   Iron/TIBC/Ferritin/ %Sat    Component Value Date/Time   IRON 49 05/17/2021 0819   IRON 49 09/12/2014 1512   TIBC 327 05/17/2021 0819   TIBC 395 09/12/2014 1512   FERRITIN 500 (H) 05/17/2021 0819   FERRITIN 22 09/12/2014 1512   IRONPCTSAT 15 05/17/2021 0819   IRONPCTSAT 12 (L) 09/12/2014 1512   Lipid Panel     Component Value Date/Time   CHOL 200 (H) 05/14/2021 1246   TRIG 99 05/14/2021 1246   HDL 41 05/14/2021 1246   CHOLHDL 4.9 (H) 05/14/2021 1246   CHOLHDL 4.4 Ratio 08/17/2010 2122   VLDL 18 08/17/2010 2122   LDLCALC 141 (H) 05/14/2021 1246   Hepatic Function Panel     Component Value Date/Time   PROT 7.6 02/14/2023 1617   PROT 8.1 04/07/2013 1508   ALBUMIN  3.8 02/14/2023 1617   ALBUMIN  3.5 04/07/2013 1508   AST 19 02/14/2023 1617   AST 16 04/07/2013 1508   ALT 19 02/14/2023 1617   ALT 17 04/07/2013 1508   ALKPHOS 84 02/14/2023 1617   ALKPHOS 64 04/07/2013 1508   BILITOT 0.2 02/14/2023 1617    BILITOT 0.21 04/07/2013 1508      Component Value Date/Time   TSH 1.720 05/17/2021 0819   TSH 2.150 04/19/2020 1546     Assessment and Plan   ESSENTIAL HYPERTENSION  Chronic diastolic CHF (congestive heart failure) (HCC)  OSA (obstructive sleep apnea)  Diabetes mellitus due to underlying condition with diabetic autonomic neuropathy, unspecified whether long term insulin  use (HCC)  Morbid obesity (HCC), STARTING BMI 74.42    Assessment and Plan          ESTABLISH WITH HWW     Obesity Treatment / Action  Plan:  Patient will work on garnering support from family and friends to begin weight loss journey. Will work on eliminating or reducing the presence of highly palatable, calorie dense foods in the home. Will complete provided nutritional and psychosocial assessment questionnaire before the next appointment. Will be scheduled for indirect calorimetry to determine resting energy expenditure in a fasting state.  This will allow us  to create a reduced calorie, high-protein meal plan to promote loss of fat mass while preserving muscle mass. Counseled on the health benefits of losing 5%-15% of total body weight. Was counseled on nutritional approaches to weight loss and benefits of reducing processed foods and consuming plant-based foods and high quality protein as part of nutritional weight management. Was counseled on pharmacotherapy and role as an adjunct in weight management.   Obesity Education Performed Today:  She was weighed on the bioimpedance scale and results were discussed and documented in the synopsis.  We discussed obesity as a disease and the importance of a more detailed evaluation of all the factors contributing to the disease.  We discussed the importance of long term lifestyle changes which include nutrition, exercise and behavioral modifications as well as the importance of customizing this to her specific health and social needs.  We discussed the  benefits of reaching a healthier weight to alleviate the symptoms of existing conditions and reduce the risks of the biomechanical, metabolic and psychological effects of obesity.  We reviewed the four pillars of obesity medicine and importance of using a multimodal approach.  We reviewed the basic principles in weight management.   Cynthia Becker appears to be in the action stage of change and states they are ready to start intensive lifestyle modifications and behavioral modifications.  I have spent 30 minutes in the care of the patient today including: 5 minutes before the visit reviewing and preparing the chart. 20 minutes face-to-face assessing and reviewing listed medical problems as outlined in obesity care plan, providing nutritional and behavioral counseling on topics outlined in the obesity care plan, counseling regarding anti-obesity medication as outlined in obesity care plan, independently interpreting test results and goals of care, as described in assessment and plan, reviewing and discussing biometric information and progress, and reviewing latest PCP notes and specialist consultations 5 minutes after the visit updating chart and documentation of encounter.  Reviewed by clinician on day of visit: allergies, medications, problem list, medical history, surgical history, family history, social history, and previous encounter notes pertinent to obesity diagnosis.  Cynthia Becker d. Kathia Covington, NP-C

## 2024-01-05 ENCOUNTER — Other Ambulatory Visit: Payer: Self-pay

## 2024-01-05 ENCOUNTER — Encounter: Payer: Self-pay | Admitting: Sports Medicine

## 2024-01-05 ENCOUNTER — Ambulatory Visit: Payer: MEDICAID | Admitting: Sports Medicine

## 2024-01-05 DIAGNOSIS — E0843 Diabetes mellitus due to underlying condition with diabetic autonomic (poly)neuropathy: Secondary | ICD-10-CM

## 2024-01-05 DIAGNOSIS — M25561 Pain in right knee: Secondary | ICD-10-CM | POA: Diagnosis not present

## 2024-01-05 DIAGNOSIS — G8929 Other chronic pain: Secondary | ICD-10-CM | POA: Diagnosis not present

## 2024-01-05 DIAGNOSIS — M170 Bilateral primary osteoarthritis of knee: Secondary | ICD-10-CM

## 2024-01-05 MED ORDER — LIDOCAINE HCL 1 % IJ SOLN
4.0000 mL | INTRAMUSCULAR | Status: AC | PRN
  Administered 2024-01-05: 4 mL

## 2024-01-05 NOTE — Progress Notes (Signed)
 Patient says that her right knee has been painful again for the last month. She says that the left knee still feels okay, as she got that injection more recently. She is here today to try Zilretta  injection for the right knee.

## 2024-01-05 NOTE — Progress Notes (Signed)
 Cynthia Becker - 53 y.o. female MRN [id number]  Date of birth: 10/01/1970  Office Visit Note: Visit Date: 01/05/2024 PCP: Oley Bascom RAMAN, NP Referred by: Oley Bascom RAMAN, NP  Subjective: Chief Complaint  Patient presents with   Right Knee - Pain   HPI: Cynthia Becker is a pleasant 53 y.o. female who presents today for follow-up of acute on chronic bilateral knee pain with osteoarthritis.  She has advanced osteoarthritis of bilateral knees, worse in the medial tibiofemoral joint space.  She has been managed with infrequent injections under ultrasound guidance which have given her good relief.  However this most recent set has not been as beneficial.  She recently was approved for authorization of Zilretta , long-acting corticosteroid injection for both this and slow release to help with her diabetic control.  She is interested in proceeding with these today.  In terms of pain control,  she is managed on chronic oxycodone  10 mg every 4-6 hours as needed for pain control which she receives through pain management.  She also has been on gabapentin  900 mg 3 times daily but is tapering off of this given side effects.  With her medical provider she is weaning down on this and transitioning to Cymbalta .  She is currently taking Cymbalta  20 mg daily.  Type-II diabetes -she has been much better controlled as of recently.  She has been on Ozempic  but is in the process of attempting switching to Zepbound both for diabetes and weight loss.  She continues working on weight loss.  She is having some issues with her diuresis, managed by cardiology with 3 separate diuretic medications.  Lab Results  Component Value Date   HGBA1C 5.8 (A) 10/07/2023   Pertinent ROS were reviewed with the patient and found to be negative unless otherwise specified above in HPI.   Assessment & Plan: Visit Diagnoses:  1. Chronic pain of right knee   2. Bilateral primary osteoarthritis of knee   3. Morbid obesity (HCC)    4. Diabetes mellitus due to underlying condition with diabetic autonomic neuropathy, unspecified whether long term insulin  use (HCC)    Plan: Impression is acute on chronic bilateral knee pain in the setting of advanced osteoarthritis.  She has received relief in the past from previous corticosteroid injections but our most recent ones back in April/May of this year did not provide as longer lasting relief.  Given this and her diabetes, we did obtain authorization for Zilretta , long-acting corticosteroid.  Through shared decision making, did proceed with this injection into the right knee under ultrasound guidance.  Patient tolerated well.  It is too soon to proceed with left knee injection, she will return in about 3.5-4 weeks to have this under ultrasound guidance.  She will continue her healthy weight loss.  She is currently managed on Ozempic  2 mg IM weekly, she is working on transitioning to Zepbound through Community education officer and with her medical physician.  In terms of her pain control, she is tapering down on her gabapentin  300 mg 3 times daily and transitioning to Cymbalta .  She will also continue Cymbalta  20 mg every day. She will continue her chronic oxycodone  10 mg every 4-6 hours as needed which she receives through pain management.  I will see her back in about 1 month.  Follow-up: Return in about 29 days (around 02/03/2024) for US -guided L-knee inj only (Zilretta ) - 30 min.   Meds & Orders: No orders of the defined types were placed in this encounter.   Orders  Placed This Encounter  Procedures   Large Joint Inj: R knee   US  Guided Needle Placement - No Linked Charges     Procedures: Large Joint Inj: R knee on 01/05/2024 4:31 PM Indications: joint swelling and pain Details: 22 G 3.5 in needle, ultrasound-guided superolateral approach Medications: 4 mL lidocaine  1 % (Zilretta  (triamcinolone  acetonide extended-release injectable suspension) 32mg  (5mL) NDC: 34749-996-98 ) Outcome: tolerated  well, no immediate complications  *Procedurally-successful US -guided R-knee injection, with long-acting Zilretta  CSI  Zilretta  (triamcinolone  acetonide extended-release injectable suspension) 32mg  (5mL) NDC: 34749-996-98  Procedure, treatment alternatives, risks and benefits explained, specific risks discussed. Consent was given by the patient. Patient was prepped and draped in the usual sterile fashion.          Clinical History: No specialty comments available.  She reports that she quit smoking about 3 years ago. Her smoking use included cigarettes. She has never used smokeless tobacco.  Recent Labs    02/14/23 1617 05/19/23 1546 07/24/23 1553 08/18/23 1507 10/07/23 1118  HGBA1C 5.9* 5.8  --   --  5.8*  LABURIC  --   --  11.0* 8.9*  --     Objective:   Vital Signs: LMP 02/13/2014   Physical Exam  Gen: Well-appearing, in no acute distress; non-toxic CV: Well-perfused. Warm.  Resp: Breathing unlabored on room air; no wheezing. Psych: Fluid speech in conversation; appropriate affect; normal thought process  Ortho Exam - Bilateral knees: There is no significant redness, warmth or effusion of either knee.  There is bilateral patellar crepitus noted and medial joint line TTP right greater than left.  Patient is brought into the visit today in a wheelchair.  Imaging:  DG Knee Complete 4 Views Left CLINICAL DATA:  bilateral knee pain  EXAM: Bilateral knees - four views  COMPARISON:  05/17/2021  FINDINGS: No evidence of fracture, dislocation, or joint effusion. There is bilateral tricompartmental joint space narrowing, sclerosis and osteophytes consistent with degenerative joint disease. No evidence of effusion.  IMPRESSION: Bilateral degenerative changes.  Electronically Signed   By: Fonda Field M.D.   On: 11/15/2022 21:19 DG Knee Complete 4 Views Right CLINICAL DATA:  bilateral knee pain  EXAM: Bilateral knees - four views  COMPARISON:   05/17/2021  FINDINGS: No evidence of fracture, dislocation, or joint effusion. There is bilateral tricompartmental joint space narrowing, sclerosis and osteophytes consistent with degenerative joint disease. No evidence of effusion.  IMPRESSION: Bilateral degenerative changes.  Electronically Signed   By: Fonda Field M.D.   On: 11/15/2022 21:19  Past Medical/Family/Surgical/Social History: Medications & Allergies reviewed per EMR, new medications updated. Patient Active Problem List   Diagnosis Date Noted   Class 3 severe obesity due to excess calories with body mass index (BMI) greater than or equal to 70 in adult 02/20/2023   Chronic pain of both knees 11/08/2022   Osteoarthritis of both knees    Influenza A 05/17/2021   Flu 05/16/2021   Acute on chronic respiratory failure with hypoxia and hypercapnia (HCC) 04/17/2021   Abnormal CT of brain 04/17/2021   Polycythemia 04/17/2021   Chronic back pain 04/17/2021   Leukocytosis 04/17/2021   Episode of unresponsiveness 04/17/2021   Acute respiratory failure with hypoxia (HCC) 10/02/2020   Chronic diastolic CHF (congestive heart failure) (HCC) 04/29/2020   Essential hypertension 04/25/2020   Community acquired bacterial pneumonia 04/25/2020   Class 3 obesity 04/25/2020   Diabetes mellitus due to underlying condition with diabetic autonomic neuropathy, unspecified whether long term insulin  use (HCC)  04/24/2020   Acute respiratory failure (HCC) 04/24/2020   Asthmatic bronchitis 03/21/2020   Nocturnal hypoxia 03/21/2020   OSA (obstructive sleep apnea) 12/22/2019   Shortness of breath 08/09/2019   No-show for appointment 06/30/2019   Morbid obesity with BMI of 60.0-69.9, adult (HCC) 09/14/2014   Iron deficiency anemia due to chronic blood loss 09/14/2014   Constipation 09/14/2014   Postoperative state 04/12/2014   Uterine fibroid 03/17/2014   DUB (dysfunctional uterine bleeding) 01/27/2014   Iron deficiency anemia due to  chronic blood loss 04/09/2013   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   Menometrorrhagia 04/09/2013   Left hand pain 09/18/2011   High blood pressure    ANEMIA, IRON DEFICIENCY 08/06/2010   CHOLECYSTECTOMY, HX OF 04/09/2010   Dyslipidemia 02/13/2010   DEGENERATIVE DISC DISEASE, LUMBOSACRAL SPINE W/RADICULOPATHY 11/01/2009   BACK PAIN, LUMBAR, WITH RADICULOPATHY 10/30/2009   TRIGGER FINGER 02/03/2009   Carpal tunnel syndrome 12/21/2008   Headache(784.0) 11/09/2008   Depression 10/04/2008   URI 08/24/2008   INSOMNIA 08/24/2008   Asthma 06/06/2008   Morbid obesity (HCC) 05/24/2008   ESSENTIAL HYPERTENSION 05/24/2008   Past Medical History:  Diagnosis Date   Anemia    iv iron treatment dec 2014/ see oncology for this last in dec 2014   Anxiety    Arthritis    knee, hands   Asthma    seasonal related/ albuterol  used when uri   CHF (congestive heart failure) (HCC)    CN (constipation) 09/14/2014   DEGENERATIVE DISC DISEASE, LUMBOSACRAL SPINE W/RADICULOPATHY 11/01/2009   Qualifier: Diagnosis of  By: Lelon RIGGERS, Scott     Depression    Diabetes (HCC)    H/O dizziness    Hx   Headache(784.0)    otc med prn   High blood pressure    HYPERLIPIDEMIA 02/13/2010   Qualifier: Diagnosis of  By: Lelon RIGGERS, Scott     INSOMNIA 08/24/2008   Qualifier: Diagnosis of  By: Loretha MD, [place]     Morbid obesity (HCC) 05/24/2008   Qualifier: Diagnosis of  By: Loretha MD, [place]     SVD (spontaneous vaginal delivery)    x 3   Family History  Problem Relation Age of Onset   Diabetes Mother    Hypertension Father    Heart disease Other    Diabetes Other    Hypertension Other    Alcohol abuse Other    Hypertension Brother    Asthma Brother    Hypertension Brother    Asthma Brother    COPD Maternal Grandmother    Lung cancer Maternal Grandmother    Asthma Paternal Grandmother    Asthma Paternal Aunt    Past Surgical History:  Procedure Laterality Date   ABDOMINAL HYSTERECTOMY   03/13/2014   total    BILATERAL SALPINGECTOMY Bilateral 04/12/2014   Procedure: BILATERAL SALPINGECTOMY;  Surgeon: Lynwood KANDICE Solomons, MD;  Location: [facility];  Service: Gynecology;  Laterality: Bilateral;   CARPAL TUNNEL RELEASE  08/30/2011   Procedure: CARPAL TUNNEL RELEASE;  Surgeon: Arley JONELLE Curia, MD;  Location: [facility];  Service: Orthopedics;  Laterality: Right;   CHOLECYSTECTOMY  10/11   lap choli   GALLBLADDER SURGERY  04/04/2010   HYSTEROSCOPY N/A 02/08/2014   Procedure: HYSTEROSCOPY;  Surgeon: Lynwood KANDICE Solomons, MD;  Location: [facility];  Service: Gynecology;  Laterality: N/A;   HYSTEROSCOPY WITH NOVASURE N/A 02/08/2014   Procedure: ATTEMPTED NOVASURE ABLATION;  Surgeon: Lynwood KANDICE Solomons, MD;  Location: [facility];  Service: Gynecology;  Laterality: N/A;   INTRAUTERINE DEVICE INSERTION     07/19/2013   SEPTOPLASTY N/A 09/08/2013   Procedure: SEPTOPLASTY;  Surgeon: Merilee Kraft, MD;  Location: [facility];  Service: ENT;  Laterality: N/A;   SPINE SURGERY     TONSILLECTOMY     TONSILLECTOMY AND ADENOIDECTOMY Bilateral 09/08/2013   Procedure: TONSILLECTOMY ;  Surgeon: Merilee Kraft, MD;  Location: [facility];  Service: ENT;  Laterality: Bilateral;   TUBAL LIGATION     VAGINAL HYSTERECTOMY N/A 04/12/2014   Procedure: LAPAROSCOPIC ASSISTED VAGINAL HYSTERECTOMY;  Surgeon: Lynwood KANDICE Solomons, MD;  Location: [facility];  Service: Gynecology;  Laterality: N/A;   WISDOM TOOTH EXTRACTION     Social History   Occupational History   Not on file  Tobacco Use   Smoking status: Former    Current packs/day: 0.00    Types: Cigarettes    Quit date: 04/24/2020    Years since quitting: 3.7   Smokeless tobacco: Never   Tobacco comments:    4-5 cigarettes a day  Vaping Use   Vaping status: Never Used  Substance and Sexual Activity   Alcohol use: No   Drug use: No   Sexual activity: Not on file

## 2024-01-06 ENCOUNTER — Other Ambulatory Visit (INDEPENDENT_AMBULATORY_CARE_PROVIDER_SITE_OTHER): Payer: MEDICAID

## 2024-01-06 ENCOUNTER — Other Ambulatory Visit: Payer: Self-pay

## 2024-01-06 ENCOUNTER — Ambulatory Visit (HOSPITAL_BASED_OUTPATIENT_CLINIC_OR_DEPARTMENT_OTHER): Payer: MEDICAID | Attending: Sports Medicine | Admitting: Physical Therapy

## 2024-01-06 DIAGNOSIS — E0843 Diabetes mellitus due to underlying condition with diabetic autonomic (poly)neuropathy: Secondary | ICD-10-CM

## 2024-01-06 NOTE — Therapy (Incomplete)
 OUTPATIENT PHYSICAL THERAPY LOWER EXTREMITY EVALUATION   Patient Name: Cynthia Becker MRN: [id number] DOB:Feb 07, 1971, 53 y.o., female Today's Date: 01/06/2024  END OF SESSION:   Past Medical History:  Diagnosis Date   Anemia    iv iron treatment dec 2014/ see oncology for this last in dec 2014   Anxiety    Arthritis    knee, hands   Asthma    seasonal related/ albuterol  used when uri   CHF (congestive heart failure) (HCC)    CN (constipation) 09/14/2014   DEGENERATIVE DISC DISEASE, LUMBOSACRAL SPINE W/RADICULOPATHY 11/01/2009   Qualifier: Diagnosis of  By: Lelon RIGGERS, Scott     Depression    Diabetes (HCC)    H/O dizziness    Hx   Headache(784.0)    otc med prn   High blood pressure    HYPERLIPIDEMIA 02/13/2010   Qualifier: Diagnosis of  By: Lelon RIGGERS, Scott     INSOMNIA 08/24/2008   Qualifier: Diagnosis of  By: Loretha MD, [place]     Morbid obesity (HCC) 05/24/2008   Qualifier: Diagnosis of  By: Loretha MD, [place]     SVD (spontaneous vaginal delivery)    x 3   Past Surgical History:  Procedure Laterality Date   ABDOMINAL HYSTERECTOMY  03/13/2014   total    BILATERAL SALPINGECTOMY Bilateral 04/12/2014   Procedure: BILATERAL SALPINGECTOMY;  Surgeon: Lynwood KANDICE Solomons, MD;  Location: [facility];  Service: Gynecology;  Laterality: Bilateral;   CARPAL TUNNEL RELEASE  08/30/2011   Procedure: CARPAL TUNNEL RELEASE;  Surgeon: Arley JONELLE Curia, MD;  Location:  SURGERY CENTER;  Service: Orthopedics;  Laterality: Right;   CHOLECYSTECTOMY  10/11   lap choli   GALLBLADDER SURGERY  04/04/2010   HYSTEROSCOPY N/A 02/08/2014   Procedure: HYSTEROSCOPY;  Surgeon: Lynwood KANDICE Solomons, MD;  Location: [facility];  Service: Gynecology;  Laterality: N/A;   HYSTEROSCOPY WITH NOVASURE N/A 02/08/2014   Procedure: ATTEMPTED NOVASURE ABLATION;  Surgeon: Lynwood KANDICE Solomons, MD;  Location: [facility];  Service: Gynecology;  Laterality: N/A;   INTRAUTERINE DEVICE INSERTION     07/19/2013   SEPTOPLASTY N/A  09/08/2013   Procedure: SEPTOPLASTY;  Surgeon: Merilee Kraft, MD;  Location: [facility] OR;  Service: ENT;  Laterality: N/A;   SPINE SURGERY     TONSILLECTOMY     TONSILLECTOMY AND ADENOIDECTOMY Bilateral 09/08/2013   Procedure: TONSILLECTOMY ;  Surgeon: Merilee Kraft, MD;  Location: [facility] OR;  Service: ENT;  Laterality: Bilateral;   TUBAL LIGATION     VAGINAL HYSTERECTOMY N/A 04/12/2014   Procedure: LAPAROSCOPIC ASSISTED VAGINAL HYSTERECTOMY;  Surgeon: Lynwood KANDICE Solomons, MD;  Location: [facility];  Service: Gynecology;  Laterality: N/A;   WISDOM TOOTH EXTRACTION     Patient Active Problem List   Diagnosis Date Noted   Class 3 severe obesity due to excess calories with body mass index (BMI) greater than or equal to 70 in adult 02/20/2023   Chronic pain of both knees 11/08/2022   Osteoarthritis of both knees    Influenza A 05/17/2021   Flu 05/16/2021   Acute on chronic respiratory failure with hypoxia and hypercapnia (HCC) 04/17/2021   Abnormal CT of brain 04/17/2021   Polycythemia 04/17/2021   Chronic back pain 04/17/2021   Leukocytosis 04/17/2021   Episode of unresponsiveness 04/17/2021   Acute respiratory failure with hypoxia (HCC) 10/02/2020   Chronic diastolic CHF (congestive heart failure) (HCC) 04/29/2020   Essential hypertension 04/25/2020   Community acquired bacterial pneumonia 04/25/2020   Class 3 obesity 04/25/2020  Diabetes mellitus due to underlying condition with diabetic autonomic neuropathy, unspecified whether long term insulin  use (HCC) 04/24/2020   Acute respiratory failure (HCC) 04/24/2020   Asthmatic bronchitis 03/21/2020   Nocturnal hypoxia 03/21/2020   OSA (obstructive sleep apnea) 12/22/2019   Shortness of breath 08/09/2019   No-show for appointment 06/30/2019   Morbid obesity with BMI of 60.0-69.9, adult (HCC) 09/14/2014   Iron deficiency anemia due to chronic blood loss 09/14/2014   Constipation 09/14/2014   Postoperative state 04/12/2014   Uterine fibroid 03/17/2014    DUB (dysfunctional uterine bleeding) 01/27/2014   Iron deficiency anemia due to chronic blood loss 04/09/2013   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   Menometrorrhagia 04/09/2013   Left hand pain 09/18/2011   High blood pressure    ANEMIA, IRON DEFICIENCY 08/06/2010   CHOLECYSTECTOMY, HX OF 04/09/2010   Dyslipidemia 02/13/2010   DEGENERATIVE DISC DISEASE, LUMBOSACRAL SPINE W/RADICULOPATHY 11/01/2009   BACK PAIN, LUMBAR, WITH RADICULOPATHY 10/30/2009   TRIGGER FINGER 02/03/2009   Carpal tunnel syndrome 12/21/2008   Headache(784.0) 11/09/2008   Depression 10/04/2008   URI 08/24/2008   INSOMNIA 08/24/2008   Asthma 06/06/2008   Morbid obesity (HCC) 05/24/2008   ESSENTIAL HYPERTENSION 05/24/2008    PCP: Bascom Borer NP  REFERRING PROVIDER: Lonell Sprang DO  REFERRING DIAG: M17.0 (ICD-10-CM) - Bilateral primary osteoarthritis of knee   THERAPY DIAG:  No diagnosis found.  Rationale for Evaluation and Treatment: Rehabilitation  ONSET DATE: ***  SUBJECTIVE:   SUBJECTIVE STATEMENT: R knee injection yesterday. Has had multiple knee injections in past but they have been less effective.  PERTINENT HISTORY: 1. Chronic pain of right knee [M25.561, G89.29]  2. Bilateral primary osteoarthritis of knee [M17.0]  3. Diabetes mellitus due to underlying condition with diabetic autonomic neuropathy, unspecified whether long term insulin  use (HCC) [E08.43] 4. Morbid Obesity   PAIN:  Are you having pain? {OPRCPAIN:27236}  PRECAUTIONS: {Therapy precautions:24002}  RED FLAGS: {PT Red Flags:29287}   WEIGHT BEARING RESTRICTIONS: {Yes ***/No:24003}  FALLS:  Has patient fallen in last 6 months? {fallsyesno:27318}  LIVING ENVIRONMENT: Lives with: {OPRC lives with:25569::lives with their family} Lives in: {Lives in:25570} Stairs: {opstairs:27293} Has following equipment at home: {Assistive devices:23999}  OCCUPATION: ***  PLOF: {PLOF:24004}  PATIENT GOALS: ***  NEXT MD VISIT:  ***  OBJECTIVE:  Note: Objective measures were completed at Evaluation unless otherwise noted.  DIAGNOSTIC FINDINGS: ***  PATIENT SURVEYS:  {rehab surveys:24030}  COGNITION: Overall cognitive status: {cognition:24006}     SENSATION: {sensation:27233}  EDEMA:  {edema:24020}  MUSCLE LENGTH: Hamstrings: Right *** deg; Left *** deg Debby test: Right *** deg; Left *** deg  POSTURE: {posture:25561}  PALPATION: ***  LOWER EXTREMITY ROM:  {AROM/PROM:27142} ROM Right eval Left eval  Hip flexion    Hip extension    Hip abduction    Hip adduction    Hip internal rotation    Hip external rotation    Knee flexion    Knee extension    Ankle dorsiflexion    Ankle plantarflexion    Ankle inversion    Ankle eversion     (Blank rows = not tested)  LOWER EXTREMITY MMT:  MMT Right eval Left eval  Hip flexion    Hip extension    Hip abduction    Hip adduction    Hip internal rotation    Hip external rotation    Knee flexion    Knee extension    Ankle dorsiflexion    Ankle plantarflexion    Ankle inversion  Ankle eversion     (Blank rows = not tested)  LOWER EXTREMITY SPECIAL TESTS:  {LEspecialtests:26242}  FUNCTIONAL TESTS:  {Functional tests:24029}  GAIT: Distance walked: *** Assistive device utilized: {Assistive devices:23999} Level of assistance: {Levels of assistance:24026} Comments: ***                                                                                                                                TREATMENT DATE: ***    PATIENT EDUCATION:  Education details: *** Person educated: {Person educated:25204} Education method: {Education Method:25205} Education comprehension: {Education Comprehension:25206}  HOME EXERCISE PROGRAM: ***  ASSESSMENT:  CLINICAL IMPRESSION: Patient is a *** y.o. *** who was seen today for physical therapy evaluation and treatment for ***.   OBJECTIVE IMPAIRMENTS: {opptimpairments:25111}.    ACTIVITY LIMITATIONS: {activitylimitations:27494}  PARTICIPATION LIMITATIONS: {participationrestrictions:25113}  PERSONAL FACTORS: {Personal factors:25162} are also affecting patient's functional outcome.   REHAB POTENTIAL: {rehabpotential:25112}  CLINICAL DECISION MAKING: {clinical decision making:25114}  EVALUATION COMPLEXITY: {Evaluation complexity:25115}   GOALS: Goals reviewed with patient? Yes  SHORT TERM GOALS: Target date: *** *** Baseline: Goal status: INITIAL  2.  *** Baseline:  Goal status: INITIAL  3.  *** Baseline:  Goal status: INITIAL  4.  *** Baseline:  Goal status: INITIAL  5.  *** Baseline:  Goal status: INITIAL  6.  *** Baseline:  Goal status: INITIAL  LONG TERM GOALS: Target date: ***  *** Baseline:  Goal status: INITIAL  2.  *** Baseline:  Goal status: INITIAL  3.  *** Baseline:  Goal status: INITIAL  4.  *** Baseline:  Goal status: INITIAL  5.  *** Baseline:  Goal status: INITIAL  6.  *** Baseline:  Goal status: INITIAL   PLAN:  PT FREQUENCY: {rehab frequency:25116}  PT DURATION: {rehab duration:25117}  PLANNED INTERVENTIONS: 97164- PT Re-evaluation, 97750- Physical Performance Testing, 97110-Therapeutic exercises, 97530- Therapeutic activity, 97112- Neuromuscular re-education, 97535- Self Care, 02859- Manual therapy, [id number]- Gait training, [id number]- Aquatic Therapy, [id number]- Electrical stimulation (manual), [id number]- Ionotophoresis 4mg /ml Dexamethasone , 79439 (1-2 muscles), 20561 (3+ muscles)- Dry Needling, Patient/Family education, Balance training, Stair training, Taping, Joint mobilization, DME instructions, Cryotherapy, and Moist heat  PLAN FOR NEXT SESSION: PIERRETTE Shuck Nice) Garrie Elenes MPT 01/06/24 1:54 PM [facility] [address] [place], [place], [address] Phone: [phone]   Fax:  [phone]

## 2024-01-06 NOTE — Addendum Note (Signed)
 Addended by: BRINDA LORAIN SQUIBB on: 01/06/2024 12:03 PM   Modules accepted: Level of Service

## 2024-01-06 NOTE — Progress Notes (Signed)
 01/06/2024 Name: Cynthia Becker MRN: [id number] DOB: [birth year]  Chief Complaint  Patient presents with   Diabetes   Obesity    Cynthia Becker is a 53 y.o. year old female who presented for a telephone visit.   They were referred to the pharmacist by their PCP for assistance in managing diabetes, hypertension, and hyperlipidemia. Cynthia Becker PMH includes HTN, HFpEF, asthma, OSA, T2DM, HLD, BMI > 60, chronic respiratory failure on O2.    Subjective: Patient was last seen by PCP, Cynthia Borer, NP, on 10/07/23. At last visit, patient was referred for pharmacy for assistance with Ozempic  dosing as her weight loss had slowed on max-dose. [facility]. Goals identified at this visit include eliminating and reducing the presence of calorie dense food in the home. At last CHF clinic visit on 10/14/23, patient reported that she was mildly SOB with ADLs and swelling had improved with use of metolazone .   Today, patient reports that she is doing ok. She reports that she had a good conversation with the provider at [facility], but she is unsure if she will be able to pay the $100 to be in the program. She reports that duloxetine  is working well for her (as compared to gabapentin ), but she feels she may need a dose increase for returning Becker/sx of neuropathy.    Care Team: Primary Care Provider: Borer Cynthia RAMAN, NP ; Next Scheduled Visit: 01/07/24  Medication Access/Adherence  Current Pharmacy:  [facility] [address] [place] - [address] [place] [address] Phone: [phone] Fax: [phone]   Patient reports affordability concerns with their medications: No  Patient reports access/transportation concerns to their pharmacy: Yes  Patient reports adherence concerns with their medications:  No    Diabetes:  Current medications: Ozempic  2 mg weekly  Reports that she is tolerating  Ozempic  - reports that her BG are slightly higher, and that she has a stronger appetite again (wears off faster).   Current glucose readings:  Using glucometer; testing once daily 01/06/24: FBG 137 01/05/24: FBG 132 01/04/24: FBG 126 01/03/24: 196 mg/dL (afternoon) - reports that she was having orange juice around this time 01/02/24:  FBG 143  01/01/24: FBG 121  12/31/23:  FBG 126   Patient denies hypoglycemic Becker/sx including dizziness, shakiness, sweating. Patient denies hyperglycemic symptoms including polyuria, polydipsia, polyphagia, nocturia, neuropathy, blurred vision.  Current meal patterns: Usually does not get hungry until ~2PM - Breakfast: skips usually - Lunch: eats salad with [place] or grilled chicken - Supper: Likes cabbage, green beans, zucchini, greens for vegetables. Eats meat usually once/day at dinner. Sometimes rice. - Drinks: weakness is grape juice (drinks ~64 oz over 3 days, previously drank every day), trying to drink more water to make up for it.   Current physical activity: limited by mobility  Current medication access support: Medicaid insurance  Highest weight 478 lbs, lowest 416 lbs > reports she has gone back up to 428 lbs. Short term goal is to get to 400 lbs. She is interested in having gastric sleeve surgery.   Heart Failure (EF 60-65% in 2023):  Current medications:  ACEi/ARB/ARNI: Entresto  49-51 mg BID SGLT2i: none d/t concern for GU AE Beta blocker: none Mineralocorticoid Receptor Antagonist: spironolactone  25 mg daily Diuretic regimen: torsemide  20 mg BID, metolazone  2.5 mg once weekly (Monday) - she has missed one week of metolazone  Potassium: 20 mEq once daily (except  40 mEq with metolazone )   Patient reports volume overload signs or symptoms including shortness of breath, lower extremity edema (increase in feet/ankles). Reports that    Objective:  BP Readings from Last 3 Encounters:  12/22/23 118/78  10/14/23 110/80  10/07/23 107/61     Lab Results  Component Value Date   HGBA1C 5.8 (A) 10/07/2023   HGBA1C 5.8 05/19/2023   HGBA1C 5.9 (H) 02/14/2023       Latest Ref Rng & Units 10/29/2023    3:39 PM 10/14/2023    4:20 PM 09/16/2023   11:21 AM  BMP  Glucose 70 - 99 mg/dL 91  86  76   BUN 6 - 20 mg/dL 29  28  12    Creatinine 0.44 - 1.00 mg/dL 8.57  8.51  8.78   Sodium 135 - 145 mmol/L 135  136  139   Potassium 3.5 - 5.1 mmol/L 3.5  4.2  3.5   Chloride 98 - 111 mmol/L 96  96  98   CO2 22 - 32 mmol/L 25  28  30    Calcium  8.9 - 10.3 mg/dL 9.4  9.3  8.9     Lab Results  Component Value Date   CHOL 200 (H) 05/14/2021   HDL 41 05/14/2021   LDLCALC 141 (H) 05/14/2021   TRIG 99 05/14/2021   CHOLHDL 4.9 (H) 05/14/2021    Medications Reviewed Today     Reviewed by Cynthia Becker, RPH (Pharmacist) on 01/06/24 at 1157  Med List Status: <None>   Medication Order Taking? Sig Documenting Provider Last Dose Status Informant  ACCU-CHEK FASTCLIX LANCETS MISC [id number]   [provider]  Active Spouse/Significant Other  ACCU-CHEK GUIDE TEST test strip [id number]  USE 1 STRIP TO CHECK GLUCOSE TWICE DAILY Cynthia Becker, Cynthia S, NP  Active   albuterol  (PROVENTIL ) (2.5 MG/3ML) 0.083% nebulizer solution [id number]  USE 1 VIAL IN NEBULIZER EVERY 6 HOURS AS NEEDED FOR WHEEZING OR SHORTNESS OF BREATH Cynthia Becker, Cynthia I, NP  Active   allopurinol  (ZYLOPRIM ) 100 MG tablet [id number]  Take 1 tablet (100 mg total) by mouth daily. Cynthia Cynthia RAMAN, NP  Active   blood glucose meter kit and supplies KIT [id number]  Dispense based on patient and insurance preference. Use up to four times daily as directed. Cynthia Cynthia RAMAN, NP  Active   Blood Glucose Monitoring Suppl DEVI [id number]  1 each by Does not apply route 2 (two) times daily. May substitute to any manufacturer covered by patient'Becker insurance. Cynthia Cynthia RAMAN, NP  Active   cariprazine (VRAYLAR) 3 MG capsule [id number]  Take 3 mg by mouth daily. [provider]  Active    colchicine  0.6 MG tablet [id number]  Take 1 tablet (0.6 mg total) by mouth daily. Cynthia Cynthia RAMAN, NP  Active   cyclobenzaprine  (FLEXERIL ) 10 MG tablet [id number]  Take 10 mg by mouth 2 (two) times daily as needed for muscle spasms. [provider]  Active   DULoxetine  (CYMBALTA ) 20 MG capsule [id number] Yes Take 1 capsule (20 mg total) by mouth daily. Cynthia Cynthia RAMAN, NP  Active   ENTRESTO  49-51 MG [id number] Yes TAKE 1 TABLET BY MOUTH TWICE DAILY **  APPOINTMENT  REQUIRED  FOR  FUTURE  REFILLS** Bensimhon, Toribio SAUNDERS, MD  Active   EQ ALLERGY RELIEF, CETIRIZINE , 10 MG tablet [id number]  Take 1 tablet by mouth once daily Cynthia Becker, Cynthia S, NP  Active    Patient taking differently:   Discontinued 01/06/24 1157  Med Note MAYER, [facility] R   Tue Nov 11, 2023  9:23 AM) Taking 300 mg nightly - Cymbalta  will replace medication and patient reports this is the last week of gabapentin   ibuprofen  (ADVIL ) 800 MG tablet [id number]  Take 800 mg by mouth every 8 (eight) hours as needed for moderate pain. [provider]  Active Spouse/Significant Other           Med Note MAYER SPEAKS R   Tue Nov 11, 2023  9:22 AM) Taking PRN  LINZESS  290 MCG CAPS capsule [id number]  TAKE 1 CAPSULE BY MOUTH ONCE DAILY BEFORE BREAKFAST Cynthia Becker, Cynthia S, NP  Active   metolazone  (ZAROXOLYN ) 2.5 MG tablet [id number]  Take 1 tablet (2.5 mg total) by mouth once a week. [place], [place], [place]  Active   naloxone  (NARCAN ) nasal spray 4 mg/0.1 mL [id number]  Place 1 spray into the nose as needed. [provider]  Active Spouse/Significant Other  omeprazole  (PRILOSEC) 20 MG capsule [id number]  Take 1 capsule (20 mg total) by mouth at bedtime. Cynthia Cynthia RAMAN, NP  Active   Oxycodone  HCl 10 MG TABS [id number]  Take 10 mg by mouth every 4 (four) hours. [provider]  Active   oxyCODONE -acetaminophen  (PERCOCET) 10-325 MG tablet [id number]  Take 1 tablet by mouth every 4 (four) hours as needed for pain.  [provider]  Active   potassium chloride  SA (KLOR-CON  M) 20 MEQ tablet [id number] Yes Take 1 tablet (20 mEq total) by mouth daily. TAKE AN EXTRA (2) TABLETS ON DAYS THAT YOU TAKE METOLAZONE  2.5MG  Marcine Catalan M, PA-C  Active   promethazine  (PHENERGAN ) 25 MG tablet [id number]  Take 25 mg by mouth every 8 (eight) hours as needed for nausea or vomiting. [provider]  Active Spouse/Significant Other  Semaglutide , 2 MG/DOSE, (OZEMPIC , 2 MG/DOSE,) 8 MG/3ML SOPN [id number] Yes Inject 2 mg into the skin once a week. Cynthia Cynthia RAMAN, NP  Active            Med Note MAYER, [facility] R   Tue Nov 11, 2023  9:26 AM) Emily on Sunday  spironolactone  (ALDACTONE ) 25 MG tablet [id number] Yes Take 1 tablet (25 mg total) by mouth daily. Hayes Beckey CROME, NP  Active   torsemide  (DEMADEX ) 20 MG tablet [id number]  Take 3 tablets (60 mg total) by mouth 2 (two) times daily.  Patient taking differently: Take 60 mg by mouth 2 (two) times daily. 80 mg qam and 60 mg q pm   Hayes Beckey CROME, NP  Active   ZTLIDO  1.8 % [facility] [id number]  Apply 1 patch topically as needed (back pain). [provider]  Active               Assessment/Plan:   Diabetes: - Currently controlled with most recent A1C of 5.8% below goal <7%. Medication adherence appears appropriate. She desires continued titration of GLP-1RA for weight loss. Will submit prior authorization for Wegovy  to [facility] insurance to allow for titration to 2.4 mg weekly. Will document weight loss from there with plan to submit PA for Zepbound if she does not lose additional weight after 3 months of treatment at maximum dose + lifestyle intervention.  - Last UACR: due at next PCP appointment - Patient denies personal or family history of multiple endocrine neoplasia type 2, medullary thyroid cancer; personal history of pancreatitis or gallbladder disease. - Reviewed long term cardiovascular and renal outcomes of uncontrolled blood sugar -  Reviewed goal A1c, goal fasting,  and goal 2 hour post prandial glucose - Reviewed dietary modifications including  utilizing the healthy plate method, limiting portion size of carbohydrate foods, increasing intake of protein and non-starchy vegetables. Counseled patient to stay hydrated with water throughout the day. Discussed alternatives to juice.  - Reviewed lifestyle modifications including: aiming for 150 minutes of moderate intensity exercise every week.  - Recommend to switch from Ozempic  2 mg weekly to Wegovy  2.4 mg weekly. Will collaborate with CPhT to submit PA.   - Recommend to check glucose twice daily: fasting and 2-hr PPG. Counseled patient to bring glucometer or BG log to every appointment. - Next A1C due July 2025      Heart Failure: - Currently appropriately managed, though patient has noted increased fluid retention recently in the setting of missing one week of metolazone . She was counseled to request refill from [facility] and call to schedule appt with HF clinic if condition does not improve 1-2 days after restarting the medication.  - Reviewed appropriate blood pressure monitoring technique and reviewed goal blood pressure - Reviewed to weigh daily and when to contact cardiology with weight gain - Recommend to continue current regimen:   ACEi/ARB/ARNI: Entresto  49-51 mg BID SGLT2i: none d/t concern for GU AE/hygiene Beta blocker: none Mineralocorticoid Receptor Antagonist: spironolactone  25 mg daily Diuretic regimen: torsemide  20 mg BID, metolazone  2.5 mg once weekly (Mondays) Potassium: 20 mEq once daily (except 40 mEq with metolazone )   Will recommend that PCP increase duloxetine  to 60 mg once daily for chronic pain syndrome at appointment tomorrow   Follow Up Plan:  Pharmacist telephone 02/02/24 PCP clinic visit 01/07/24   Cynthia Becker, PharmD [facility] [phone]

## 2024-01-07 ENCOUNTER — Ambulatory Visit (INDEPENDENT_AMBULATORY_CARE_PROVIDER_SITE_OTHER): Payer: MEDICAID | Admitting: Nurse Practitioner

## 2024-01-07 ENCOUNTER — Encounter: Payer: Self-pay | Admitting: Nurse Practitioner

## 2024-01-07 VITALS — BP 101/73 | HR 89 | Temp 98.7°F | Wt >= 6400 oz

## 2024-01-07 DIAGNOSIS — E66813 Obesity, class 3: Secondary | ICD-10-CM

## 2024-01-07 DIAGNOSIS — Z6841 Body Mass Index (BMI) 40.0 and over, adult: Secondary | ICD-10-CM | POA: Diagnosis not present

## 2024-01-07 MED ORDER — TRIAMCINOLONE ACETONIDE 0.1 % EX CREA: 1.0000 | TOPICAL_CREAM | Freq: Two times a day (BID) | CUTANEOUS | 0 refills | Status: DC

## 2024-01-07 MED ORDER — DULOXETINE HCL 30 MG PO CPEP
30.0000 mg | ORAL_CAPSULE | Freq: Every day | ORAL | 3 refills | Status: DC
Start: 2024-01-07 — End: 2024-05-14

## 2024-01-07 NOTE — Progress Notes (Signed)
 Subjective   Patient ID: Cynthia Becker, female    DOB: Aug 14, 1970, 53 y.o.   MRN: [id number]  Chief Complaint  Patient presents with   Medical Management of Chronic Issues    Referring provider: Oley Bascom RAMAN, NP  Cynthia Becker is a 53 y.o. female with Past Medical History: No date: Anemia     Comment:  iv iron treatment dec 2014/ see oncology for this last               in dec 2014 No date: Anxiety No date: Arthritis     Comment:  knee, hands No date: Asthma     Comment:  seasonal related/ albuterol  used when uri No date: CHF (congestive heart failure) (HCC) 09/14/2014: CN (constipation) 11/01/2009: DEGENERATIVE DISC DISEASE, LUMBOSACRAL SPINE W/ RADICULOPATHY     Comment:  Qualifier: Diagnosis of  By: Lelon RIGGERS, Scott   No date: Depression No date: Diabetes (HCC) No date: H/O dizziness     Comment:  Hx No date: Headache(784.0)     Comment:  otc med prn No date: High blood pressure 02/13/2010: HYPERLIPIDEMIA     Comment:  Qualifier: Diagnosis of  By: Lelon RIGGERS, Scott   08/24/2008: INSOMNIA     Comment:  Qualifier: Diagnosis of  By: Loretha MD, Richerd   05/24/2008: Morbid obesity (HCC)     Comment:  Qualifier: Diagnosis of  By: Loretha MD, [place]   No date: SVD (spontaneous vaginal delivery)     Comment:  x 3   HPI  Patient presents today for a follow-up visit for hypertension and diabetes.  Overall she has been doing well.   Patient followed with orthopedics. A1C at last visit was  5.8.  Denies f/c/s, n/v/d, hemoptysis, PND, leg swelling. Denies chest pain or edema.  Pharmacy is working with patient for diabetic medication management.  Patient is requesting referral for bariatric surgery for weight loss.  Will place referral today.  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 - will order kenalog  cream  No Known Allergies  Immunization History  Administered Date(s) Administered   Influenza Whole 04/25/2010   Influenza,inj,Quad PF,6+ Mos 03/07/2014, 03/10/2019    Pneumococcal Polysaccharide-23 04/13/2014   Td 08/02/2010    Tobacco History: Social History   Tobacco Use  Smoking Status Former   Current packs/day: 0.00   Types: Cigarettes   Quit date: 04/24/2020   Years since quitting: 3.7  Smokeless Tobacco Never  Tobacco Comments   4-5 cigarettes a day   Counseling given: Not Answered Tobacco comments: 4-5 cigarettes a day   Outpatient Encounter Medications as of 01/07/2024  Medication Sig   ACCU-CHEK FASTCLIX LANCETS MISC    ACCU-CHEK GUIDE TEST test strip USE 1 STRIP TO CHECK GLUCOSE TWICE DAILY   albuterol  (PROVENTIL ) (2.5 MG/3ML) 0.083% nebulizer solution USE 1 VIAL IN NEBULIZER EVERY 6 HOURS AS NEEDED FOR WHEEZING OR SHORTNESS OF BREATH   allopurinol  (ZYLOPRIM ) 100 MG tablet Take 1 tablet (100 mg total) by mouth daily.   blood glucose meter kit and supplies KIT Dispense based on patient and insurance preference. Use up to four times daily as directed.   Blood Glucose Monitoring Suppl DEVI 1 each by Does not apply route 2 (two) times daily. May substitute to any manufacturer covered by patient's insurance.   cariprazine (VRAYLAR) 3 MG capsule Take 3 mg by mouth daily.   colchicine  0.6 MG tablet Take 1 tablet (0.6 mg total) by mouth daily.   cyclobenzaprine  (FLEXERIL ) 10 MG tablet Take 10 mg  by mouth 2 (two) times daily as needed for muscle spasms.   DULoxetine  (CYMBALTA ) 30 MG capsule Take 1 capsule (30 mg total) by mouth daily.   ENTRESTO  49-51 MG TAKE 1 TABLET BY MOUTH TWICE DAILY **  APPOINTMENT  REQUIRED  FOR  FUTURE  REFILLS**   EQ ALLERGY RELIEF, CETIRIZINE , 10 MG tablet Take 1 tablet by mouth once daily   ibuprofen  (ADVIL ) 800 MG tablet Take 800 mg by mouth every 8 (eight) hours as needed for moderate pain.   LINZESS  290 MCG CAPS capsule TAKE 1 CAPSULE BY MOUTH ONCE DAILY BEFORE BREAKFAST   metolazone  (ZAROXOLYN ) 2.5 MG tablet Take 1 tablet (2.5 mg total) by mouth once a week.   naloxone  (NARCAN ) nasal spray 4 mg/0.1 mL Place 1  spray into the nose as needed.   omeprazole  (PRILOSEC) 20 MG capsule Take 1 capsule (20 mg total) by mouth at bedtime.   Oxycodone  HCl 10 MG TABS Take 10 mg by mouth every 4 (four) hours.   oxyCODONE -acetaminophen  (PERCOCET) 10-325 MG tablet Take 1 tablet by mouth every 4 (four) hours as needed for pain.   potassium chloride  SA (KLOR-CON  M) 20 MEQ tablet Take 1 tablet (20 mEq total) by mouth daily. TAKE AN EXTRA (2) TABLETS ON DAYS THAT YOU TAKE METOLAZONE  2.5MG    promethazine  (PHENERGAN ) 25 MG tablet Take 25 mg by mouth every 8 (eight) hours as needed for nausea or vomiting.   Semaglutide , 2 MG/DOSE, (OZEMPIC , 2 MG/DOSE,) 8 MG/3ML SOPN Inject 2 mg into the skin once a week.   spironolactone  (ALDACTONE ) 25 MG tablet Take 1 tablet (25 mg total) by mouth daily.   torsemide  (DEMADEX ) 20 MG tablet Take 3 tablets (60 mg total) by mouth 2 (two) times daily.   triamcinolone  cream (KENALOG ) 0.1 % Apply 1 Application topically 2 (two) times daily.   [DISCONTINUED] DULoxetine  (CYMBALTA ) 20 MG capsule Take 1 capsule (20 mg total) by mouth daily.   ZTLIDO  1.8 % PTCH Apply 1 patch topically as needed (back pain).   No facility-administered encounter medications on file as of 01/07/2024.    Review of Systems  Review of Systems  Constitutional: Negative.   HENT: Negative.    Cardiovascular: Negative.   Gastrointestinal: Negative.   Allergic/Immunologic: Negative.   Neurological: Negative.   Psychiatric/Behavioral: Negative.       Objective:   BP 101/73   Pulse 89   Temp 98.7 F (37.1 C) (Oral)   Wt (!) 422 lb 9.6 oz (191.7 kg)   LMP 02/13/2014   SpO2 92%   BMI 74.86 kg/m   Wt Readings from Last 5 Encounters:  01/07/24 (!) 422 lb 9.6 oz (191.7 kg)  12/22/23 (!) 420 lb (190.5 kg)  10/14/23 (!) 418 lb 6.4 oz (189.8 kg)  10/07/23 (!) 436 lb (197.8 kg)  09/16/23 (!) 436 lb 3.2 oz (197.9 kg)     Physical Exam Vitals and nursing note reviewed.  Constitutional:      General: She is  not in acute distress.    Appearance: She is well-developed.  Cardiovascular:     Rate and Rhythm: Normal rate and regular rhythm.  Pulmonary:     Effort: Pulmonary effort is normal.     Breath sounds: Normal breath sounds.  Neurological:     Mental Status: She is alert and oriented to person, place, and time.       Assessment & Plan:   Class 3 severe obesity due to excess calories without serious comorbidity with body  mass index (BMI) greater than or equal to 70 in adult -     Amb Referral to Bariatric Surgery  Other orders -     DULoxetine  HCl; Take 1 capsule (30 mg total) by mouth daily.  Dispense: 30 capsule; Refill: 3 -     Triamcinolone  Acetonide; Apply 1 Application topically 2 (two) times daily.  Dispense: 30 g; Refill: 0     Return in about 3 months (around 04/08/2024).   Bascom GORMAN Borer, NP 01/07/2024

## 2024-01-09 ENCOUNTER — Telehealth: Payer: Self-pay

## 2024-01-09 NOTE — Telephone Encounter (Signed)
 Pharmacy Patient Advocate Encounter  Received notification from [employer] that Prior Authorization for WEGOVY  has been DENIED.  Full denial letter will be uploaded to the media tab. See denial reason below.   BMI AND WEIGHT WITHIN LAST 45 DAYS REQUIRED. CALLED FOR PEER-TO-PEER REVIEW TODAY WITH MEASUREMENT TAKEN AT 01/07/2024 VISIT AND FAXED CHART NOTES. RECONSIDERATION CASE # [id number]

## 2024-01-10 ENCOUNTER — Other Ambulatory Visit: Payer: Self-pay | Admitting: Nurse Practitioner

## 2024-01-10 DIAGNOSIS — M109 Gout, unspecified: Secondary | ICD-10-CM

## 2024-01-12 ENCOUNTER — Other Ambulatory Visit: Payer: Self-pay

## 2024-01-12 ENCOUNTER — Encounter: Payer: Self-pay | Admitting: Oncology

## 2024-01-12 NOTE — Telephone Encounter (Signed)
 [facility] was able to process prescription today for $4 copay.

## 2024-01-12 NOTE — Telephone Encounter (Signed)
 Please advise [facility]

## 2024-01-13 ENCOUNTER — Other Ambulatory Visit: Payer: Self-pay

## 2024-01-13 ENCOUNTER — Telehealth: Payer: Self-pay

## 2024-01-13 DIAGNOSIS — E0843 Diabetes mellitus due to underlying condition with diabetic autonomic (poly)neuropathy: Secondary | ICD-10-CM

## 2024-01-13 MED ORDER — WEGOVY 2.4 MG/0.75ML ~~LOC~~ SOAJ: 2.4000 mg | SUBCUTANEOUS | 5 refills | Status: DC

## 2024-01-13 NOTE — Telephone Encounter (Signed)
 Contacted patient to inform her of Wegovy  2.4 mg approval. Sent Rx to [facility]. Contacted [facility] to request for them to put back active Ozempic  Rx and fill Wegovy  now. [facility] pharmacy representative reports there is still a PA required rejection. Will troubleshoot and communicate updates to pharmacy and patient.   Pharmacy telephone follow-up scheduled: 02/02/24  Lorain Baseman, PharmD [facility] [phone]

## 2024-01-14 ENCOUNTER — Other Ambulatory Visit: Payer: Self-pay

## 2024-01-14 ENCOUNTER — Other Ambulatory Visit (HOSPITAL_COMMUNITY): Payer: Self-pay

## 2024-01-14 NOTE — Telephone Encounter (Signed)
 PA for Wegovy  2.4 mg approved. Confirmed that [facility] processed with $4 copay.   Lorain Baseman, PharmD [facility] [phone]

## 2024-02-02 ENCOUNTER — Ambulatory Visit: Payer: Self-pay

## 2024-02-02 ENCOUNTER — Other Ambulatory Visit (HOSPITAL_COMMUNITY): Payer: Self-pay | Admitting: Family Medicine

## 2024-02-02 ENCOUNTER — Other Ambulatory Visit: Payer: MEDICAID

## 2024-02-02 DIAGNOSIS — E0843 Diabetes mellitus due to underlying condition with diabetic autonomic (poly)neuropathy: Secondary | ICD-10-CM

## 2024-02-02 NOTE — Progress Notes (Signed)
 02/02/2024 Name: Cynthia Becker MRN: [id number] DOB: [birth year]  Chief Complaint  Patient presents with   Weight Management Screening   Diabetes    Cynthia Becker is a 53 y.o. year old female who presented for a telephone visit.   They were referred to the pharmacist by their PCP for assistance in managing diabetes, hypertension, and hyperlipidemia. [name] PMH includes HTN, HFpEF, asthma, OSA, T2DM, HLD, BMI > 60, chronic respiratory failure on O2.    Subjective: Patient was last seen by PCP, Cynthia Borer, NP, on 10/07/23. At last visit, patient was referred for pharmacy for assistance with Ozempic  dosing as her weight loss had slowed on max-dose. She [facility] Jonel [facility]. Goals identified at this visit include eliminating and reducing the presence of calorie dense food in the home. At last CHF clinic visit on 10/14/23, patient reported that she was mildly SOB with ADLs and swelling had improved with use of metolazone . Patient was first engaged by pharmacy via telephone on 01/06/24. She reported she was not able to pay the entrance fee for [facility].She endorsed no longer feeling appetite suppression effects from Ozempic  2 mg weekly. We submitted a PA for her to switch to Wegovy  2.4 mg, which was approved.   Today, patient reports doing well. She just took her second dose of Wegovy  2.4 mg weekly. She reports that she has noticed that her appetite has decreased a little bit more. Reports that she has a virtual appt for gastric bypass surgery tomorrow.   Care Team: Primary Care Provider: Borer Cynthia RAMAN, NP ; Next Scheduled Visit: 04/08/24  Medication Access/Adherence  Current Pharmacy:  [facility] [address], [place] - [address] [place] [address] Phone: [phone] Fax: [phone]   Patient reports affordability concerns with their medications: No  Patient reports  access/transportation concerns to their pharmacy: Yes  Patient reports adherence concerns with their medications:  No    Diabetes:  Current medications: Wegovy  2.4 mg weekly  Reports that she is tolerating Ozempic  - reports that her BG are slightly higher, and that she has a stronger appetite again (wears off faster).   Current glucose readings:  Using glucometer; testing once daily - FBG usually in the 110s. Last week and this AM FBG 141-149 mg/dL. Highest last week FBG of 242 mg/dL.  Patient denies hypoglycemic Becker/sx including dizziness, shakiness, sweating. Patient denies hyperglycemic symptoms including polyuria, polydipsia, polyphagia, nocturia, neuropathy, blurred vision.  Current meal patterns: Usually does not get hungry until ~2PM. Feels like she has been struggling with eating more carbohydrates recently. Some days she has just had fruit (watermelon, cherries, grapes, plums).  - Breakfast: skips usually - Lunch: eats salad with [place] or grilled chicken - Supper: Likes cabbage, green beans, zucchini, greens for vegetables. Eats meat usually once/day at dinner. Sometimes rice. - Drinks: weakness is grape juice (drinks ~64 oz over 3 days, previously drank every day), trying to drink more water to make up for it.   Current physical activity: limited by mobility. She is getting injections for her knees- so this helps her pain, and hopes she will be able to be more active.  Current medication access support: Medicaid insurance  Highest weight 478 lbs, lowest 416 lbs > reports she has gone back up to 428 lbs. Short term goal is to get to 400 lbs. She is interested in having gastric sleeve surgery.   Heart Failure (  EF 60-65% in 2023):  Current medications:  ACEi/ARB/ARNI: Entresto  49-51 mg BID (not filled since 07/02/23 for 90ds - reports only taking once daily) SGLT2i: none d/t concern for GU AE Beta blocker: none Mineralocorticoid Receptor Antagonist: spironolactone  25 mg  daily Diuretic regimen: torsemide  20 mg BID, metolazone  2.5 mg once weekly (Monday)  Potassium: 20 mEq once daily (except 40 mEq with metolazone )  Reports that last week her L foot swelled up - states that it hurts to the touch. She thinks it is related to fluid, but has been treated for gout in the past. Has been difficult to walk. Endorses some swelling on the R side, but no pain. Reports that she is taking allopurinol .  Patient reports volume overload signs or symptoms including shortness of breath, lower extremity edema (increase in feet/ankles).    Objective:  BP Readings from Last 3 Encounters:  01/07/24 101/73  12/22/23 118/78  10/14/23 110/80    Lab Results  Component Value Date   HGBA1C 5.8 (A) 10/07/2023   HGBA1C 5.8 05/19/2023   HGBA1C 5.9 (H) 02/14/2023       Latest Ref Rng & Units 10/29/2023    3:39 PM 10/14/2023    4:20 PM 09/16/2023   11:21 AM  BMP  Glucose 70 - 99 mg/dL 91  86  76   BUN 6 - 20 mg/dL 29  28  12    Creatinine 0.44 - 1.00 mg/dL 8.57  8.51  8.78   Sodium 135 - 145 mmol/L 135  136  139   Potassium 3.5 - 5.1 mmol/L 3.5  4.2  3.5   Chloride 98 - 111 mmol/L 96  96  98   CO2 22 - 32 mmol/L 25  28  30    Calcium  8.9 - 10.3 mg/dL 9.4  9.3  8.9     Lab Results  Component Value Date   CHOL 200 (H) 05/14/2021   HDL 41 05/14/2021   LDLCALC 141 (H) 05/14/2021   TRIG 99 05/14/2021   CHOLHDL 4.9 (H) 05/14/2021    Medications Reviewed Today     Reviewed by Cynthia Becker, RPH (Pharmacist) on 02/02/24 at 1210  Med List Status: <None>   Medication Order Taking? Sig Documenting Provider Last Dose Status Informant  ACCU-CHEK FASTCLIX LANCETS MISC [id number]   [provider]  Active Spouse/Significant Other  ACCU-CHEK GUIDE TEST test strip [id number]  USE 1 STRIP TO CHECK GLUCOSE TWICE DAILY Becker, Cynthia S, NP  Active   albuterol  (PROVENTIL ) (2.5 MG/3ML) 0.083% nebulizer solution [id number]  USE 1 VIAL IN NEBULIZER EVERY 6 HOURS AS NEEDED FOR  WHEEZING OR SHORTNESS OF Cynthia Becker, Cynthia I, NP  Active   allopurinol  (ZYLOPRIM ) 100 MG tablet [id number] Yes Take 1 tablet by mouth once daily Becker, Cynthia S, NP  Active   blood glucose meter kit and supplies KIT [id number]  Dispense based on patient and insurance preference. Use up to four times daily as directed. Oley Cynthia RAMAN, NP  Active   Blood Glucose Monitoring Suppl DEVI [id number]  1 each by Does not apply route 2 (two) times daily. May substitute to any manufacturer covered by patient'Becker insurance. Oley Cynthia RAMAN, NP  Active   cariprazine (VRAYLAR) 3 MG capsule [id number]  Take 3 mg by mouth daily. [provider]  Active     Discontinued 02/02/24 1208 (Completed Course)   cyclobenzaprine  (FLEXERIL ) 10 MG tablet [id number]  Take 10 mg by mouth 2 (two) times daily as needed for muscle spasms. [provider]  Active   DULoxetine  (CYMBALTA ) 30 MG capsule [id number] Yes Take 1 capsule (30 mg total) by mouth daily. Oley Cynthia RAMAN, NP  Active   ENTRESTO  49-51 MG [id number] Yes TAKE 1 TABLET BY MOUTH TWICE DAILY **  APPOINTMENT  REQUIRED  FOR  FUTURE  REFILLS**  Patient taking differently: TAKE 1 TABLET BY MOUTH TWICE DAILY **  APPOINTMENT  REQUIRED  FOR  FUTURE  REFILLS**   Bensimhon, Toribio SAUNDERS, MD  Active   EQ ALLERGY RELIEF, CETIRIZINE , 10 MG tablet [id number]  Take 1 tablet by mouth once daily Becker, Cynthia S, NP  Active   ibuprofen  (ADVIL ) 800 MG tablet [id number]  Take 800 mg by mouth every 8 (eight) hours as needed for moderate pain. [provider]  Active Spouse/Significant Other           Med Note MAYER SPEAKS R   Tue Nov 11, 2023  9:22 AM) Taking PRN  LINZESS  290 MCG CAPS capsule [id number] Yes TAKE 1 CAPSULE BY MOUTH ONCE DAILY BEFORE BREAKFAST Becker, Cynthia S, NP  Active   metolazone  (ZAROXOLYN ) 2.5 MG tablet [id number] Yes Take 1 tablet (2.5 mg total) by mouth once a week. [place], [place], [place]  Active   naloxone  (NARCAN ) nasal spray 4 mg/0.1 mL  [id number]  Place 1 spray into the nose as needed. [provider]  Active Spouse/Significant Other    Discontinued 02/02/24 1209 (Expired Prescription)     Discontinued 02/02/24 1209 (Completed Course)   oxyCODONE -acetaminophen  (PERCOCET) 10-325 MG tablet [id number] Yes Take 1 tablet by mouth every 4 (four) hours as needed for pain. [provider]  Active   potassium chloride  SA (KLOR-CON  M) 20 MEQ tablet [id number] Yes Take 1 tablet (20 mEq total) by mouth daily. TAKE AN EXTRA (2) TABLETS ON DAYS THAT YOU TAKE METOLAZONE  2.5MG  Marcine Caffie HERO, PA-C  Active     Discontinued 02/02/24 1209 (Expired Prescription)   Discontinued 02/02/24 1209 (Change in therapy)            Med Note MAYER, ASAJAH R   Tue Nov 11, 2023  9:26 AM) Emily on Sunday  Semaglutide -Weight Management (WEGOVY ) 2.4 MG/0.75ML SOAJ [id number] Yes Inject 2.4 mg into the skin once a week. Oley Cynthia RAMAN, NP  Active   spironolactone  (ALDACTONE ) 25 MG tablet [id number] Yes Take 1 tablet (25 mg total) by mouth daily. Hayes Beckey CROME, NP  Active   torsemide  (DEMADEX ) 20 MG tablet [id number] Yes Take 3 tablets (60 mg total) by mouth 2 (two) times daily. Hayes Beckey CROME, NP  Active   triamcinolone  cream (KENALOG ) 0.1 % [id number]  Apply 1 Application topically 2 (two) times daily. Oley Cynthia RAMAN, NP  Active   ZTLIDO  1.8 % [facility] [id number]  Apply 1 patch topically as needed (back pain). [provider]  Active               Assessment/Plan:   Diabetes: - Currently controlled with most recent A1C of 5.8% below goal <7%. Medication adherence appears appropriate. She desires continued treatment with GLP-1RA for weight loss. She is tolerating Wegovy  2.4 mg weekly with incremental increase in appetite suppression. Discussed lifestyle intervention at length to help with weight loss. Will document weight loss on Wegovy  2.4 mg with plan to submit PA for Zepbound if she does not lose additional weight after 3  months of treatment at maximum dose + lifestyle intervention.  - Last UACR: due at next PCP appointment - Patient denies personal or  family history of multiple endocrine neoplasia type 2, medullary thyroid cancer; personal history of pancreatitis or gallbladder disease. - Reviewed long term cardiovascular and renal outcomes of uncontrolled blood sugar - Reviewed goal A1c, goal fasting, and goal 2 hour post prandial glucose - Reviewed dietary modifications including  utilizing the healthy plate method, limiting portion size of carbohydrate foods, increasing intake of protein and non-starchy vegetables. Counseled patient to stay hydrated with water throughout the day. Discussed alternatives to juice.  - Discussed eating protein/vegetable first and saving the carbohydrate food for last at each meal. Recommend incorporating more protein into her diet, and trying to eat her servings of fruit with plain greek yogurt.  - Reviewed lifestyle modifications including: aiming for 150 minutes of moderate intensity exercise every week. Will collaborate with PCP to investigate whether patient needs treatment for acute gout flare to promote increased physical activity.  - Recommend to continue Wegovy  2.4 mg weekly.   - Recommend to check glucose twice daily: fasting and 2-hr PPG. Counseled patient to bring glucometer or BG log to every appointment. - Next A1C due now   Heart Failure: - Currently appropriately managed, though patient revealed only taking Entresto  once daily. Will collaborate with provider to obtain refills. - Reviewed appropriate blood pressure monitoring technique and reviewed goal blood pressure - Reviewed to weigh daily and when to contact cardiology with weight gain - Recommend to continue current regimen:   ACEi/ARB/ARNI: Entresto  49-51 mg BID - encouraged patient to take twice daily to help prevent fluid accumulation SGLT2i: none d/t concern for GU AE/hygiene Beta blocker:  none Mineralocorticoid Receptor Antagonist: spironolactone  25 mg daily Diuretic regimen: torsemide  20 mg BID, metolazone  2.5 mg once weekly (Mondays) Potassium: 20 mEq once daily (except 40 mEq with metolazone )   Gout:  - Currently uncontrolled with last uric acid level of 8.9 mg/dL on allopurinol  100 mg daily, above goal < 6 mg/dL. Patient describing Becker/sx consistent with acute gout flare in L foot, though picture complicated by chronic LEE 2/2 HFpEF. Advised patient to call PCP as she may need an Rx for colchicine  to treat flare - Recommend rechecking uric acid level at PCP follow-up and titrating to allopurinol  200 mg daily if needed - If determined to have acute gout flare, consider treating with colchicine  1.2 mg x1 dose then 0.6 mg one hour later followed by 0.6 mg once daily for 1 week.   Follow Up Plan:  Pharmacist telephone 03/12/24 PCP clinic visit 04/08/24   Lorain Baseman, PharmD [facility] [phone]

## 2024-02-02 NOTE — Telephone Encounter (Signed)
 FYI Only or Action Required?: Action required by provider: update on patient condition.  Patient was last seen in primary care on 01/07/2024 by Cynthia Bascom RAMAN, NP.  Called Nurse Triage reporting Joint Swelling.  Symptoms began several weeks ago.  Interventions attempted: Prescription medications: Allopurinol  .  Symptoms are: gradually worsening. Pt. States my gout is getting worse, I need my medicine increased. Declines OV due to transportation.  Triage Disposition: See Physician Within 24 Hours  Patient/caregiver understands and will follow disposition?: No, wishes to speak with PCP   Copied from CRM #[id number]. Topic: Clinical - Red Word Triage >> Feb 02, 2024  2:08 PM Everette C wrote: [facility] that prompted transfer to Nurse Triage: The patient shares that they are experiencing swelling and enlargement in their left ankle and foot. Reason for Disposition  SEVERE ankle swelling (e.g., can't move swollen ankle at all)  Answer Assessment - Initial Assessment Questions 1. LOCATION: Which ankle is swollen? Where is the swelling?     Both, left is worse 2. ONSET: When did the swelling start?     1-2 weeks 3. SWELLING: How bad is the swelling? Or, How large is it? (e.g., mild, moderate, severe; size of localized swelling)      moderate 4. PAIN: Is there any pain? If Yes, ask: How bad is it? (Scale 0-10; or none, mild, moderate, severe)     7 5. CAUSE: What do you think caused the ankle swelling?     gout 6. OTHER SYMPTOMS: Do you have any other symptoms? (e.g., fever, chest pain, difficulty breathing, calf pain)     no 7. PREGNANCY: Is there any chance you are pregnant? When was your last menstrual period?     no  Protocols used: Ankle Swelling-A-AH

## 2024-02-02 NOTE — Telephone Encounter (Signed)
 Pt was made a sooner appt. KH

## 2024-02-09 ENCOUNTER — Ambulatory Visit: Payer: Self-pay | Admitting: Nurse Practitioner

## 2024-02-10 ENCOUNTER — Other Ambulatory Visit (HOSPITAL_COMMUNITY): Payer: Self-pay | Admitting: Internal Medicine

## 2024-02-10 ENCOUNTER — Other Ambulatory Visit: Payer: Self-pay | Admitting: Nurse Practitioner

## 2024-02-10 DIAGNOSIS — G894 Chronic pain syndrome: Secondary | ICD-10-CM

## 2024-02-10 DIAGNOSIS — M109 Gout, unspecified: Secondary | ICD-10-CM

## 2024-02-10 NOTE — Telephone Encounter (Signed)
 Please advise [facility]

## 2024-02-12 ENCOUNTER — Encounter: Payer: Self-pay | Admitting: Sports Medicine

## 2024-02-12 ENCOUNTER — Other Ambulatory Visit: Payer: Self-pay

## 2024-02-12 ENCOUNTER — Ambulatory Visit: Payer: MEDICAID | Admitting: Sports Medicine

## 2024-02-12 DIAGNOSIS — M25562 Pain in left knee: Secondary | ICD-10-CM | POA: Diagnosis not present

## 2024-02-12 DIAGNOSIS — G8929 Other chronic pain: Secondary | ICD-10-CM | POA: Diagnosis not present

## 2024-02-12 DIAGNOSIS — M170 Bilateral primary osteoarthritis of knee: Secondary | ICD-10-CM

## 2024-02-12 MED ORDER — LIDOCAINE HCL 1 % IJ SOLN
5.0000 mL | INTRAMUSCULAR | Status: AC | PRN
  Administered 2024-02-12: 5 mL

## 2024-02-12 NOTE — Progress Notes (Signed)
   Procedure Note  Patient: Cynthia Becker             Date of Birth: 1971-04-21           MRN: [id number]             Visit Date: 02/12/2024  Procedures: Visit Diagnoses:  1. Bilateral primary osteoarthritis of knee   2. Chronic pain of left knee    Large Joint Inj: L knee on 02/12/2024 1:09 PM Indications: pain Details: 22 G 3.5 in needle, ultrasound-guided superolateral approach Medications: 5 mL lidocaine  1 % (Zilretta  (triamcinolone  acetonide extended-release injectable suspension) 32mg  (5mL) NDC: 34749-996-98 ) Outcome: tolerated well, no immediate complications  *Procedurally-successful US -guided L-knee injection, with long-acting Zilretta  CSI   Zilretta  (triamcinolone  acetonide extended-release injectable suspension) 32mg  (5mL) NDC: 34749-996-98  *5 cc of lidocaine  1% was used for soft tissue numbing prior to injection  Procedure, treatment alternatives, risks and benefits explained, specific risks discussed. Consent was given by the patient. Patient was prepped and draped in the usual sterile fashion.     - patient tolerated procedure well, discussed post-injection protocol - can follow-up as needed. Can repeat Zilretta  q  > 3 months if helpful vs. Regular CSI - Does feel that the previous steroid injection for the contralateral left knee is working better and slightly longer than previous CSI  Would be due for R-knee --> 04/04/24 Would be due for L-knee or b/l --> 05/12/24  The patient would benefit from a long-acting corticosteroid injection, I.e. Zilretta , as they have tried topical and oral anti-inflammatories such as OTC anti-inflammatories, Ibuprofen , Tylenol , Cochicine, currently on Percocet without much relief. The have undergone nonpharmacologic therapy with lifestyle modification, home exercise or physical therapy, and attempted weight loss and exercise regimens without significant relief. They have failed long-term benefit from previous corticosteroid injection  therapy (with good relief but only temporary). She also is a type-II diabetic.  For these reasons, I do think they are an appropriate candidate for Zilretta  for bilateral knees.  We will send authorization through the insurance company, will notify patient when this is approved and we will schedule in clinic for injection therapy at this time when works for the patient.   Lonell Sprang, DO Primary Care Sports Medicine Physician  [facility]  This note was dictated using Dragon naturally speaking software and may contain errors in syntax, spelling, or content which have not been identified prior to signing this note.

## 2024-02-12 NOTE — Progress Notes (Signed)
 Noted. Will submit for Zilretta , right knee the week of 03/15/2024 Will submit for Zilretta , left knee once patient has a F/U on 04/05/2024

## 2024-02-24 ENCOUNTER — Ambulatory Visit (INDEPENDENT_AMBULATORY_CARE_PROVIDER_SITE_OTHER): Payer: MEDICAID | Admitting: Podiatry

## 2024-02-24 ENCOUNTER — Encounter: Payer: Self-pay | Admitting: Podiatry

## 2024-02-24 DIAGNOSIS — B351 Tinea unguium: Secondary | ICD-10-CM | POA: Diagnosis not present

## 2024-02-24 DIAGNOSIS — M79675 Pain in left toe(s): Secondary | ICD-10-CM

## 2024-02-24 DIAGNOSIS — M79674 Pain in right toe(s): Secondary | ICD-10-CM | POA: Diagnosis not present

## 2024-02-24 DIAGNOSIS — E0843 Diabetes mellitus due to underlying condition with diabetic autonomic (poly)neuropathy: Secondary | ICD-10-CM | POA: Diagnosis not present

## 2024-02-24 NOTE — Progress Notes (Unsigned)

## 2024-03-01 ENCOUNTER — Telehealth: Payer: Self-pay | Admitting: Nurse Practitioner

## 2024-03-01 NOTE — Telephone Encounter (Signed)
 Copied from CRM [id number]. Topic: General - Other >> Mar 01, 2024  8:09 AM Myrick T wrote: Reason for CRM: Harral patients spouse called to f/u on paperwork he dropped off at the office last week. Please f/u with patient.

## 2024-03-05 ENCOUNTER — Telehealth (INDEPENDENT_AMBULATORY_CARE_PROVIDER_SITE_OTHER): Payer: MEDICAID | Admitting: Nurse Practitioner

## 2024-03-05 DIAGNOSIS — U071 COVID-19: Secondary | ICD-10-CM

## 2024-03-05 MED ORDER — MOLNUPIRAVIR EUA 200MG CAPSULE: 4.0000 | ORAL_CAPSULE | Freq: Two times a day (BID) | ORAL | 0 refills | Status: AC

## 2024-03-05 NOTE — Progress Notes (Signed)
 Virtual Visit via Video Note  I connected with Sevana Grandinetti on 03/05/24 at  3:40 PM EDT by a video enabled telemedicine application and verified that I am speaking with the correct person using two identifiers.  Location: Patient: home Provider: office   I discussed the limitations of evaluation and management by telemedicine and the availability of in person appointments. The patient expressed understanding and agreed to proceed.  History of Present Illness:  Patient presents today as a virtual visit for an acute visit.  She states that she did test positive for COVID at with an at home test this morning.  Her symptoms started Monday.  Symptoms include fatigue, chest congestion, cough, chills.  We will order molnupiravir  for patient. Denies f/c/s, n/v/d, hemoptysis, PND, leg swelling Denies chest pain or edema       Observations/Objective:     01/07/2024    3:50 PM 12/22/2023    3:00 PM 10/14/2023    3:54 PM  Vitals with BMI  Height  5' 3 5' 5  Weight 422 lbs 10 oz 420 lbs 418 lbs 6 oz  BMI 74.88 74.42 69.63  Systolic 101 118 889  Diastolic 73 78 80  Pulse 89 102 97      Assessment and Plan:   1. COVID-19 (Primary)  - molnupiravir  EUA (LAGEVRIO ) 200 mg CAPS capsule; Take 4 capsules (800 mg total) by mouth 2 (two) times daily for 5 days.  Dispense: 40 capsule; Refill: 0  Follow up as needed   I discussed the assessment and treatment plan with the patient. The patient was provided an opportunity to ask questions and all were answered. The patient agreed with the plan and demonstrated an understanding of the instructions.   The patient was advised to call back or seek an in-person evaluation if the symptoms worsen or if the condition fails to improve as anticipated.  I provided 23 minutes of non-face-to-face time during this encounter.   Bascom GORMAN Borer, NP

## 2024-03-08 ENCOUNTER — Telehealth: Payer: Self-pay | Admitting: Nurse Practitioner

## 2024-03-08 NOTE — Telephone Encounter (Signed)
 Copied from CRM [id number]. Topic: General - Other >> Mar 08, 2024  1:41 PM Wess RAMAN wrote: Reason for CRM: Patient's husband would like to know the status of FMLA paperwork. He needed it by 03/06/24  Callback #: [id number]

## 2024-03-09 NOTE — Telephone Encounter (Signed)
 The spouse of the patient called in asking if the FMLA paperwork can be faxed to him at his work when it is completed. He says it needs to be turned in within 9 days tops. His fax number is [phone]. Please contact him if there are any questions.

## 2024-03-12 ENCOUNTER — Other Ambulatory Visit (HOSPITAL_COMMUNITY): Payer: Self-pay

## 2024-03-12 ENCOUNTER — Other Ambulatory Visit: Payer: Self-pay

## 2024-03-12 ENCOUNTER — Other Ambulatory Visit: Payer: MEDICAID

## 2024-03-12 DIAGNOSIS — E0843 Diabetes mellitus due to underlying condition with diabetic autonomic (poly)neuropathy: Secondary | ICD-10-CM

## 2024-03-12 MED ORDER — OZEMPIC (2 MG/DOSE) 8 MG/3ML ~~LOC~~ SOPN: 2.0000 mg | PEN_INJECTOR | SUBCUTANEOUS | 3 refills | Status: AC

## 2024-03-12 NOTE — Progress Notes (Signed)
 03/12/2024 Name: Cynthia Becker MRN: [id number] DOB: 11/25/1970  Chief Complaint  Patient presents with   Diabetes   Obesity    Cynthia Becker is a 53 y.o. year old female who presented for a telephone visit.   They were referred to the pharmacist by their PCP for assistance in managing diabetes, hypertension, and hyperlipidemia. [name] PMH includes HTN, HFpEF, asthma, OSA, T2DM, HLD, BMI > 60, chronic respiratory failure on O2.    Subjective: Patient was last seen by PCP, Bascom Borer, NP, on 10/07/23. At last visit, patient was referred for pharmacy for assistance with Ozempic  dosing as her weight loss had slowed on max-dose. She [facility]. Goals identified at this visit include eliminating and reducing the presence of calorie dense food in the home. At last CHF clinic visit on 10/14/23, patient reported that she was mildly SOB with ADLs and swelling had improved with use of metolazone . Patient was first engaged by pharmacy via telephone on 01/06/24. She reported she was not able to pay the entrance fee for [facility].She endorsed no longer feeling appetite suppression effects from Ozempic  2 mg weekly. We submitted a PA for her to switch to Wegovy  2.4 mg, which was approved. She was engaged by pharmacy via telephone on 02/02/24. She reported tolerating Wegovy  2.4 mg well. She was seen at a [facility] clinic on 03/01/24 to discuss gastric bypass surgery.  They are still determining whether she is an appropriate candidate for weight loss surgery and that BMI will need to be < 55 prior to surgery (current 74 kg/m2).   Today, patient reports doing well. She reports she is trying not to get discouraged about weight loss because she still needs to lose 90 lbs before being eligible for weight loss surgery. But she is attending the classes with [facility] and trying to get more information about the process. Discussed that as of 03/24/24, she will no  longer be eligible for Wegovy  and will have to switch back to Ozempic  2 mg weekly. She does have COVID dx currently - she reports she is getting better but still not feeling great. Needed albuterol  nebulizer yesterday. Also reports she has been gaining 2-3 lbs of fluid per day and has used up her torsemide . She is planning to contact the Adv HF office.    Care Team: Primary Care Provider: Borer Bascom RAMAN, NP ; Next Scheduled Visit: 04/08/24  Medication Access/Adherence  Current Pharmacy:  [facility] [address], [place] - [address] [place] [address] Phone: [phone] Fax: [phone]   Patient reports affordability concerns with their medications: No  Patient reports access/transportation concerns to their pharmacy: Yes  Patient reports adherence concerns with their medications:  No    Diabetes:  Current medications: Wegovy  2.4 mg weekly  Reports that she is tolerating Wegovy  2.4 mg - does feel that it curbed her appetite a bit more compared to Ozempic  2 mg, but she will go back to Ozempic  if that is what is required by insurance.  Current glucose readings:  Using glucometer; testing once daily -  FBG continue to be controlled  Patient denies hypoglycemic s/sx including dizziness, shakiness, sweating. Patient denies hyperglycemic symptoms including polyuria, polydipsia, polyphagia, nocturia, neuropathy, blurred vision.  Current meal patterns: Usually does not get hungry until ~2PM. Feels like she has been struggling with eating more carbohydrates recently. Some days she has just had fruit (watermelon,  cherries, grapes, plums). Denies recent changes in diet, but will be attending nutrition class on 03/15/24. - Breakfast: skips usually - Lunch: eats salad with [place] or grilled chicken - Supper: Likes cabbage, green beans, zucchini, greens for vegetables. Eats meat usually once/day at dinner. Sometimes rice. - Drinks: weakness is  grape juice (drinks ~64 oz over 3 days, previously drank every day), trying to drink more water to make up for it.   Current physical activity: limited by mobility (wheelchair bound). She is getting injections for her knees- so this helps her pain, and hopes she will be able to be more active.  Current medication access support: Medicaid insurance  Highest weight 478 lbs, lowest 416 lbs.  Heart Failure (EF 60-65% in 2023):  Current medications:  ACEi/ARB/ARNI: Entresto  49-51 mg BID - confirms she is taking BID now SGLT2i: none d/t concern for GU AE Beta blocker: none Mineralocorticoid Receptor Antagonist: spironolactone  25 mg daily Diuretic regimen: torsemide  60 mg BID (ran out - has refills on file), metolazone  2.5 mg once weekly (Monday)  Potassium: 20 mEq once daily (except 40 mEq with metolazone )  Reports she has been gaining 2-3 lbs of fluid per day and has used up her torsemide  while she has had COVID this past week. She has been using torsemide , but has now used it up. She is planning to contact the Adv HF office.  Patient reports volume overload signs or symptoms including shortness of breath, lower extremity edema (increase in feet/ankles). She is having more ShOB this week with COVID - recently used her nebulizer.    Objective:  BP Readings from Last 3 Encounters:  01/07/24 101/73  12/22/23 118/78  10/14/23 110/80    Lab Results  Component Value Date   HGBA1C 5.8 (A) 10/07/2023   HGBA1C 5.8 05/19/2023   HGBA1C 5.9 (H) 02/14/2023       Latest Ref Rng & Units 10/29/2023    3:39 PM 10/14/2023    4:20 PM 09/16/2023   11:21 AM  BMP  Glucose 70 - 99 mg/dL 91  86  76   BUN 6 - 20 mg/dL 29  28  12    Creatinine 0.44 - 1.00 mg/dL 8.57  8.51  8.78   Sodium 135 - 145 mmol/L 135  136  139   Potassium 3.5 - 5.1 mmol/L 3.5  4.2  3.5   Chloride 98 - 111 mmol/L 96  96  98   CO2 22 - 32 mmol/L 25  28  30    Calcium  8.9 - 10.3 mg/dL 9.4  9.3  8.9     Lab Results  Component  Value Date   CHOL 200 (H) 05/14/2021   HDL 41 05/14/2021   LDLCALC 141 (H) 05/14/2021   TRIG 99 05/14/2021   CHOLHDL 4.9 (H) 05/14/2021    Medications Reviewed Today     Reviewed by Brinda Lorain SQUIBB, RPH (Pharmacist) on 03/12/24 at 1215  Med List Status: <None>   Medication Order Taking? Sig Documenting Provider Last Dose Status Informant  ACCU-CHEK FASTCLIX LANCETS MISC [id number]   [provider]  Active Spouse/Significant Other  ACCU-CHEK GUIDE TEST test strip [id number]  USE 1 STRIP TO CHECK GLUCOSE TWICE DAILY [name], [name], NP  Active   albuterol  (PROVENTIL ) (2.5 MG/3ML) 0.083% nebulizer solution [id number]  USE 1 VIAL IN NEBULIZER EVERY 6 HOURS AS NEEDED FOR WHEEZING OR SHORTNESS OF BREATH Passmore, [name], NP  Active   allopurinol  (ZYLOPRIM ) 100 MG tablet [id number]  Take 1 tablet by  mouth once daily [name], [name], NP  Active   blood glucose meter kit and supplies KIT [id number]  Dispense based on patient and insurance preference. Use up to four times daily as directed. Oley Bascom RAMAN, NP  Active   Blood Glucose Monitoring Suppl DEVI [id number]  1 each by Does not apply route 2 (two) times daily. May substitute to any manufacturer covered by patient's insurance. Oley Bascom RAMAN, NP  Active   cariprazine (VRAYLAR) 3 MG capsule [id number]  Take 3 mg by mouth daily. [provider]  Active   cyclobenzaprine  (FLEXERIL ) 10 MG tablet [id number]  Take 10 mg by mouth 2 (two) times daily as needed for muscle spasms. [provider]  Active   DULoxetine  (CYMBALTA ) 20 MG capsule [id number]  Take 1 capsule by mouth once daily [name], [name], NP  Active   DULoxetine  (CYMBALTA ) 30 MG capsule [id number]  Take 1 capsule (30 mg total) by mouth daily. Oley Bascom RAMAN, NP  Active   ENTRESTO  49-51 MG [id number] Yes Take 1 tablet by mouth 2 (two) times daily. Bensimhon, Toribio SAUNDERS, MD  Active   EQ ALLERGY RELIEF, CETIRIZINE , 10 MG tablet [id number]  Take 1 tablet by mouth once  daily [name], [name], NP  Active   ibuprofen  (ADVIL ) 800 MG tablet [id number]  Take 800 mg by mouth every 8 (eight) hours as needed for moderate pain. [provider]  Active Spouse/Significant Other           Med Note MAYER SPEAKS R   Tue Nov 11, 2023  9:22 AM) Taking PRN  LINZESS  290 MCG CAPS capsule [id number]  TAKE 1 CAPSULE BY MOUTH ONCE DAILY BEFORE BREAKFAST [name], [name], NP  Active   metolazone  (ZAROXOLYN ) 2.5 MG tablet [id number]  Take 1 tablet (2.5 mg total) by mouth once a week. [place], [place], [place]  Active   naloxone  (NARCAN ) nasal spray 4 mg/0.1 mL [id number]  Place 1 spray into the nose as needed. [provider]  Active Spouse/Significant Other  oxyCODONE -acetaminophen  (PERCOCET) 10-325 MG tablet [id number]  Take 1 tablet by mouth every 4 (four) hours as needed for pain. [provider]  Active   potassium chloride  SA (KLOR-CON  M) 20 MEQ tablet [id number]  Take 1 tablet (20 mEq total) by mouth daily. TAKE AN EXTRA (2) TABLETS ON DAYS THAT YOU TAKE METOLAZONE  2.5MG  Marcine Catalan M, PA-C  Active   Semaglutide -Weight Management (WEGOVY ) 2.4 MG/0.75ML SOAJ [id number] Yes Inject 2.4 mg into the skin once a week. Oley Bascom RAMAN, NP  Active   spironolactone  (ALDACTONE ) 25 MG tablet [id number] Yes Take 1 tablet (25 mg total) by mouth daily. Hayes Beckey CROME, NP  Active   torsemide  (DEMADEX ) 20 MG tablet [id number] Yes Take 3 tablets (60 mg total) by mouth 2 (two) times daily. Hayes Beckey CROME, NP  Active   triamcinolone  cream (KENALOG ) 0.1 % [id number]  Apply 1 Application topically 2 (two) times daily. Oley Bascom RAMAN, NP  Active   ZTLIDO  1.8 % [facility] [id number]  Apply 1 patch topically as needed (back pain). [provider]  Active               Assessment/Plan:   Diabetes: - Currently controlled with most recent A1C of 5.8% below goal <7%. Medication adherence appears appropriate. She desires continued treatment with GLP-1RA for weight  loss. She is tolerating Wegovy  2.4 mg weekly with incremental increase in appetite suppression. However, she will have to go back to Ozempic   2 mg weekly due to changes in [employer] coverage of GLP-1RA. Her sleep study did not indicate severe enough OSA to pursue coverage of Zepbound.  - Last UACR: due at next PCP appointment - Patient denies personal or family history of multiple endocrine neoplasia type 2, medullary thyroid cancer; personal history of pancreatitis or gallbladder disease. - Reviewed long term cardiovascular and renal outcomes of uncontrolled blood sugar - Reviewed goal A1c, goal fasting, and goal 2 hour post prandial glucose - Reviewed dietary modifications including  utilizing the healthy plate method, limiting portion size of carbohydrate foods, increasing intake of protein and non-starchy vegetables. Counseled patient to stay hydrated with water throughout the day. Discussed alternatives to juice.  - Discussed eating protein/vegetable first and saving the carbohydrate food for last at each meal. Recommend incorporating more protein into her diet, and trying to eat her servings of fruit with plain greek yogurt.  - Reviewed lifestyle modifications including: aiming for 150 minutes of moderate intensity exercise every week.  - Recommend to continue Wegovy  2.4 mg weekly then switch to Ozempic  2 mg weekly at next fill. Will collaborate with CPhT to resubmit PA. - Recommend to check glucose once daily fasting. Counseled patient to bring glucometer or BG log to every appointment. - Next A1C due now   Heart Failure: - Currently appropriately managed, though patient is having acute increase in fluid and has been counseled to contact Adv HF clinic immediately after this call. - Reviewed appropriate blood pressure monitoring technique and reviewed goal blood pressure - Reviewed to weigh daily and when to contact cardiology with weight gain - Recommend to continue current regimen:    ACEi/ARB/ARNI: Entresto  49-51 mg BID - encouraged patient to take twice daily to help prevent fluid accumulation SGLT2i: none d/t concern for GU AE/hygiene Beta blocker: none Mineralocorticoid Receptor Antagonist: spironolactone  25 mg daily Diuretic regimen: torsemide  20 mg BID, metolazone  2.5 mg once weekly (Mondays) Potassium: 20 mEq once daily (except 40 mEq with metolazone )   Gout - did not have time to discuss today, will revisit at follow-up:  - Currently uncontrolled with last uric acid level of 8.9 mg/dL on allopurinol  100 mg daily, above goal < 6 mg/dL. Patient previously describing s/sx consistent with acute gout flare in L foot, though picture complicated by chronic LEE 2/2 HFpEF.  - Recommend rechecking uric acid level at PCP follow-up and titrating to allopurinol  200 mg daily if needed   Follow Up Plan:  Pharmacist telephone 04/16/24 PCP clinic visit 04/08/24   Lorain Baseman, PharmD [facility] [phone]

## 2024-03-18 ENCOUNTER — Other Ambulatory Visit: Payer: Self-pay

## 2024-03-23 ENCOUNTER — Other Ambulatory Visit: Payer: Self-pay

## 2024-03-24 ENCOUNTER — Ambulatory Visit: Payer: MEDICAID | Admitting: Podiatry

## 2024-04-01 ENCOUNTER — Other Ambulatory Visit: Payer: Self-pay

## 2024-04-01 DIAGNOSIS — M170 Bilateral primary osteoarthritis of knee: Secondary | ICD-10-CM

## 2024-04-05 ENCOUNTER — Ambulatory Visit: Payer: MEDICAID | Admitting: Sports Medicine

## 2024-04-08 ENCOUNTER — Ambulatory Visit: Payer: Self-pay | Admitting: Nurse Practitioner

## 2024-04-09 ENCOUNTER — Other Ambulatory Visit: Payer: Self-pay

## 2024-04-16 ENCOUNTER — Other Ambulatory Visit: Payer: MEDICAID

## 2024-04-16 ENCOUNTER — Other Ambulatory Visit: Payer: Self-pay

## 2024-04-16 ENCOUNTER — Encounter: Payer: Self-pay | Admitting: Sports Medicine

## 2024-04-16 ENCOUNTER — Telehealth (HOSPITAL_COMMUNITY): Payer: Self-pay | Admitting: Cardiology

## 2024-04-16 ENCOUNTER — Ambulatory Visit: Payer: MEDICAID | Admitting: Sports Medicine

## 2024-04-16 DIAGNOSIS — G8929 Other chronic pain: Secondary | ICD-10-CM

## 2024-04-16 DIAGNOSIS — M25561 Pain in right knee: Secondary | ICD-10-CM

## 2024-04-16 DIAGNOSIS — E0843 Diabetes mellitus due to underlying condition with diabetic autonomic (poly)neuropathy: Secondary | ICD-10-CM | POA: Diagnosis not present

## 2024-04-16 DIAGNOSIS — M1711 Unilateral primary osteoarthritis, right knee: Secondary | ICD-10-CM

## 2024-04-16 DIAGNOSIS — M170 Bilateral primary osteoarthritis of knee: Secondary | ICD-10-CM | POA: Diagnosis not present

## 2024-04-16 DIAGNOSIS — I5032 Chronic diastolic (congestive) heart failure: Secondary | ICD-10-CM

## 2024-04-16 NOTE — Progress Notes (Signed)
 04/16/2024 Name: Cynthia Becker MRN: [id number] DOB: 1971-05-18  Chief Complaint  Patient presents with   Obesity   Congestive Heart Failure    Cynthia Becker is a 53 y.o. year old female who presented for a telephone visit.   They were referred to the pharmacist by their PCP for assistance in managing diabetes, hypertension, and hyperlipidemia. [name] PMH includes HTN, HFpEF, asthma, OSA, T2DM, HLD, BMI > 60, chronic respiratory failure on O2.    Subjective: Patient was last seen by PCP, Cynthia Borer, NP, on 10/07/23. At last visit, patient was referred for pharmacy for assistance with Ozempic  dosing as her weight loss had slowed on max-dose. She was [facility] Jonel at [facility] on 12[facility]. Goals identified at this visit include eliminating and reducing the presence of calorie dense food in the home. At last CHF clinic visit on 10/14/23, patient reported that she was mildly SOB with ADLs and swelling had improved with use of metolazone . Patient was first engaged by pharmacy via telephone on 01/06/24. She reported she was not able to pay the entrance fee for [facility].She endorsed no longer feeling appetite suppression effects from Ozempic  2 mg weekly. We submitted a PA for her to switch to Wegovy  2.4 mg, which was approved. She was engaged by pharmacy via telephone on 02/02/24. She reported tolerating Wegovy  2.4 mg well. She was seen at a [facility] clinic on 03/01/24 to discuss gastric bypass surgery.  They are still determining whether she is an appropriate candidate for weight loss surgery and that BMI will need to be < 55 prior to surgery (current 74 kg/m2). At pharmacy call on 03/12/24, we discussed that insurance is requiring her to switch back to Ozempic . She also reports she had been gaining 2-3 lbs of fluid per day and had run out of torsemide . She was encouraged to contact HF clinic for follow-up.  Today, patient reports doing ok. Was able to transition back to  Ozempic , was dispensed a 3 mo supply. She continues to retain more fluid, but has not scheduled with Adv HF clinic yet.  Care Team: Primary Care Provider: Borer Cynthia RAMAN, NP ; Next Scheduled Visit: 04/19/24  Medication Access/Adherence  Current Pharmacy:  [facility] [address], [place] - [address] [place] [address] Phone: [phone] Fax: [phone]   Patient reports affordability concerns with their medications: No  Patient reports access/transportation concerns to their pharmacy: Yes  Patient reports adherence concerns with their medications:  No    Diabetes:  Current medications: Ozempic  2 mg weekly  Continues to tolerate Ozmepic well - needs to lose 90 lbs to be considered for weight loss surgery  Current glucose readings:  Using glucometer; testing once daily -  FBG continue to be controlled  Patient denies hypoglycemic s/sx including dizziness, shakiness, sweating. Patient denies hyperglycemic symptoms including polyuria, polydipsia, polyphagia, nocturia, neuropathy, blurred vision.  Current meal patterns: Usually does not get hungry until ~2PM. Feels like she has been struggling with eating more carbohydrates recently. Some days she has just had fruit (watermelon, cherries, grapes, plums). Denies recent changes in diet, but will be attending nutrition class on 03/15/24. - Breakfast: skips usually - Lunch: eats salad with [place] or grilled chicken - Supper: Likes cabbage, green beans, zucchini, greens for vegetables. Eats meat usually once/day at dinner. Sometimes rice. - Drinks: currently having about 30 oz of apple juice and 30 oz of water per day  Current  physical activity: limited by mobility (wheelchair bound). She is getting injections for her knees- so this helps her pain, and hopes she will be able to be more active.  Current medication access support: Medicaid insurance  Heart Failure (EF 60-65% in  2023):  Current medications:  ACEi/ARB/ARNI: Entresto  49-51 mg BID - confirms she is taking BID now SGLT2i: none d/t concern for GU AE Beta blocker: none Mineralocorticoid Receptor Antagonist: spironolactone  25 mg daily Diuretic regimen: torsemide  60 mg BID, metolazone  2.5 mg once weekly (Monday) - does tend to forget second dose of torsemide , but has been trying to take by 12PM given recent issues with fluid. She has run out of metolazone  because she took it for several days in a row to try to get more fluid off.  Potassium: 20 mEq once daily (except 40 mEq with metolazone )   Reports her fluid status is fluctuating a lot. Is retaining fluid in her upper legs, less in her ankles. Tried to take metolazone  3 days in a row to increase diuresis but this was not effective, and now she is out of metolazone . Denies worsening ShOB.   Has been trying to follow 2 L fluid restriction: Having~ 30 oz of apple juice and ~30 oz water per day   Objective:  BP Readings from Last 3 Encounters:  01/07/24 101/73  12/22/23 118/78  10/14/23 110/80    Lab Results  Component Value Date   HGBA1C 5.8 (A) 10/07/2023   HGBA1C 5.8 05/19/2023   HGBA1C 5.9 (H) 02/14/2023       Latest Ref Rng & Units 10/29/2023    3:39 PM 10/14/2023    4:20 PM 09/16/2023   11:21 AM  BMP  Glucose 70 - 99 mg/dL 91  86  76   BUN 6 - 20 mg/dL 29  28  12    Creatinine 0.44 - 1.00 mg/dL 8.57  8.51  8.78   Sodium 135 - 145 mmol/L 135  136  139   Potassium 3.5 - 5.1 mmol/L 3.5  4.2  3.5   Chloride 98 - 111 mmol/L 96  96  98   CO2 22 - 32 mmol/L 25  28  30    Calcium  8.9 - 10.3 mg/dL 9.4  9.3  8.9     Lab Results  Component Value Date   CHOL 200 (H) 05/14/2021   HDL 41 05/14/2021   LDLCALC 141 (H) 05/14/2021   TRIG 99 05/14/2021   CHOLHDL 4.9 (H) 05/14/2021    Medications Reviewed Today     Reviewed by Brinda Lorain SQUIBB, RPH (Pharmacist) on 04/16/24 at 1204  Med List Status: <None>   Medication Order Taking? Sig Documenting  Provider Last Dose Status Informant  ACCU-CHEK FASTCLIX LANCETS MISC [id number]   [provider]  Active Spouse/Significant Other  ACCU-CHEK GUIDE TEST test strip [id number]  USE 1 STRIP TO CHECK GLUCOSE TWICE DAILY [name], [name], NP  Active   albuterol  (PROVENTIL ) (2.5 MG/3ML) 0.083% nebulizer solution [id number]  USE 1 VIAL IN NEBULIZER EVERY 6 HOURS AS NEEDED FOR WHEEZING OR SHORTNESS OF [name], [name], NP  Active   allopurinol  (ZYLOPRIM ) 100 MG tablet [id number]  Take 1 tablet by mouth once daily [name], [name], NP  Active   blood glucose meter kit and supplies KIT [id number]  Dispense based on patient and insurance preference. Use up to four times daily as directed. Oley Cynthia RAMAN, NP  Active   Blood Glucose Monitoring Suppl DEVI [id number]  1 each by Does  not apply route 2 (two) times daily. May substitute to any manufacturer covered by patient's insurance. Oley Cynthia RAMAN, NP  Active   cariprazine (VRAYLAR) 3 MG capsule [id number]  Take 3 mg by mouth daily. [provider]  Active   cyclobenzaprine  (FLEXERIL ) 10 MG tablet [id number]  Take 10 mg by mouth 2 (two) times daily as needed for muscle spasms. [provider]  Active   DULoxetine  (CYMBALTA ) 20 MG capsule [id number]  Take 1 capsule by mouth once daily [name], [name], NP  Active   DULoxetine  (CYMBALTA ) 30 MG capsule [id number]  Take 1 capsule (30 mg total) by mouth daily. Oley Cynthia RAMAN, NP  Active   ENTRESTO  49-51 MG [id number] Yes Take 1 tablet by mouth 2 (two) times daily. Bensimhon, Toribio SAUNDERS, MD  Active   EQ ALLERGY RELIEF, CETIRIZINE , 10 MG tablet [id number]  Take 1 tablet by mouth once daily [name], [name], NP  Active   ibuprofen  (ADVIL ) 800 MG tablet [id number]  Take 800 mg by mouth every 8 (eight) hours as needed for moderate pain. [provider]  Active Spouse/Significant Other           Med Note MAYER SPEAKS R   Tue Nov 11, 2023  9:22 AM) Taking PRN  LINZESS  290 MCG CAPS  capsule [id number]  TAKE 1 CAPSULE BY MOUTH ONCE DAILY BEFORE BREAKFAST [name], [name], NP  Active   metolazone  (ZAROXOLYN ) 2.5 MG tablet [id number]  Take 1 tablet (2.5 mg total) by mouth once a week.  Patient not taking: Reported on 04/16/2024   Glena Harlene HERO, FNP  Active   naloxone  (NARCAN ) nasal spray 4 mg/0.1 mL [id number]  Place 1 spray into the nose as needed. [provider]  Active Spouse/Significant Other  oxyCODONE -acetaminophen  (PERCOCET) 10-325 MG tablet [id number]  Take 1 tablet by mouth every 4 (four) hours as needed for pain. [provider]  Active   potassium chloride  SA (KLOR-CON  M) 20 MEQ tablet [id number] Yes Take 1 tablet (20 mEq total) by mouth daily. TAKE AN EXTRA (2) TABLETS ON DAYS THAT YOU TAKE METOLAZONE  2.5MG  Marcine Caffie HERO, PA-C  Active   Semaglutide , 2 MG/DOSE, (OZEMPIC , 2 MG/DOSE,) 8 MG/3ML SOPN [id number] Yes Inject 2 mg into the skin once a week. Oley Cynthia RAMAN, NP  Active     Discontinued 04/16/24 1204 (Not covered by the pt's insurance)            Med Note>> Brinda Lorain SQUIBB, [facility]   04/16/2024 12:04 PM Switched back to Ozempic     spironolactone  (ALDACTONE ) 25 MG tablet [id number] Yes Take 1 tablet (25 mg total) by mouth daily. Hayes Beckey CROME, NP  Active   torsemide  (DEMADEX ) 20 MG tablet [id number] Yes Take 3 tablets (60 mg total) by mouth 2 (two) times daily.  Patient taking differently: Take 3 tablets (60 mg total) by mouth 2 (two) times daily.   Hayes Beckey CROME, NP  Active   triamcinolone  cream (KENALOG ) 0.1 % [id number]  Apply 1 Application topically 2 (two) times daily. Oley Cynthia RAMAN, NP  Active   ZTLIDO  1.8 % [facility] [id number]  Apply 1 patch topically as needed (back pain). [provider]  Active               Assessment/Plan:   Diabetes: - Currently controlled with most recent A1C of 5.8% below goal <7%. Medication adherence appears appropriate. She desires continued treatment with GLP-1RA for weight loss. She is  tolerating Ozempic  2 mg  well (recent dose decrease after insurance stopped covering Wegovy ). Her sleep study did not indicate severe enough OSA to pursue coverage of Zepbound.  - Last UACR: due at next PCP appointment - Patient denies personal or family history of multiple endocrine neoplasia type 2, medullary thyroid cancer; personal history of pancreatitis or gallbladder disease. - Reviewed long term cardiovascular and renal outcomes of uncontrolled blood sugar - Reviewed goal A1c, goal fasting, and goal 2 hour post prandial glucose - Reviewed dietary modifications including  utilizing the healthy plate method, limiting portion size of carbohydrate foods, increasing intake of protein and non-starchy vegetables. Counseled patient to stay hydrated with water throughout the day. Discussed alternatives to juice.  - Discussed eating protein/vegetable first and saving the carbohydrate food for last at each meal. Recommend incorporating more protein into her diet, and trying to eat her servings of fruit with plain greek yogurt.  - Reviewed lifestyle modifications including: aiming for 150 minutes of moderate intensity exercise every week.  - Recommend to continue Ozempic  2 mg weekly. - Recommend to check glucose once daily fasting. Counseled patient to bring glucometer or BG log to every appointment. - Next A1C due now   Heart Failure: - Currently appropriately managed, though patient is having acute increase in fluid and has been counseled to contact Adv HF clinic immediately after this call. Encouraged her to take torsemide  60 mg BID consistently. Discussed that AHF clinic may consider titrating her Entresto , spironolactone , or torsemide  to help  maintain fluid balance. - Reviewed appropriate blood pressure monitoring technique and reviewed goal blood pressure - Reviewed to weigh daily and when to contact cardiology with weight gain - Recommend to continue current regimen:   ACEi/ARB/ARNI: Entresto   49-51 mg BID SGLT2i: none d/t concern for GU AE/hygiene Beta blocker: none Mineralocorticoid Receptor Antagonist: spironolactone  25 mg daily Diuretic regimen: torsemide  60 mg BID, metolazone  2.5 mg once weekly (Mondays) - patient has run out of this medication Potassium: 20 mEq once daily (except 40 mEq with metolazone ) - Patient has not had BMP since May, advised that she should make an appt with AHF clinic for evaluation of fluid status and repeat labs   Gout - did not have time to discuss today, will revisit at follow-up:  - Currently uncontrolled with last uric acid level of 8.9 mg/dL on allopurinol  100 mg daily, above goal < 6 mg/dL. Patient previously describing s/sx consistent with acute gout flare in L foot, though picture complicated by chronic LEE 2/2 HFpEF.  - Recommend rechecking uric acid level at PCP follow-up and titrate allopurinol  if needed   Follow Up Plan:  Pharmacist telephone 04/16/24 PCP clinic visit 04/08/24   Lorain Baseman, PharmD [facility] [phone]

## 2024-04-16 NOTE — Progress Notes (Signed)
 Patient says that she notices a difference with the length of relief that she is getting from the Zilretta  injections. She has had more pain in the right knee over the past week.

## 2024-04-16 NOTE — Progress Notes (Addendum)
 Cynthia Becker - 53 y.o. female MRN [id number]  Date of birth: 05-21-1971  Office Visit Note: Visit Date: 04/16/2024 PCP: Oley Bascom RAMAN, NP Referred by: Oley Bascom RAMAN, NP  Subjective: Chief Complaint  Patient presents with   Right Knee - Pain   HPI: Cynthia Becker is a pleasant 53 y.o. female who presents today for bilateral knee osteoarthritis, with R > L knee pain.  She has advanced osteoarthritis of bilateral knees, worse in the medial tibiofemoral joint space. She has been managed with infrequent injections under ultrasound guidance which have given her good relief, but in the past year regular steroid injections were tapering in efficacy.  We previously did Zilretta  injections for the knee which she has noticed have given her longer lasting relief.  She is set up with PM, managed on oxycodone -acetaminophen  10-325 mg every 4-6 hours as needed.  She is a type II diabetic but has been much better controlled over the past year, has been working hard on this as well as her weight loss. She is on Ozempic  both for diabetes and weight loss.   Lab Results  Component Value Date   HGBA1C 5.8 (A) 10/07/2023   Pertinent ROS were reviewed with the patient and found to be negative unless otherwise specified above in HPI.   Assessment & Plan: Visit Diagnoses:  1. Bilateral primary osteoarthritis of knee   2. Chronic pain of right knee   3. Diabetes mellitus due to underlying condition with diabetic autonomic neuropathy, without long-term current use of insulin  (HCC)   4. Morbid obesity (HCC)    Plan: Impression is advanced bilateral knee osteoarthritis with right knee exacerbation.  Previous corticosteroids were waning in duration, she did receive good and lasting relief from previous Zilretta  injection.  Through shared decision making, we did proceed with ultrasound-guided right knee Zilretta , long-acting corticosteroid, injection.  Patient tolerated well, advised on postinjection  protocol.  She would not be due for her left knee Zilretta  until about 4 weeks, we will set her up for this injection visit only and then plan to try to sync bilateral knees when indicated.  She will continue her chronic pain medicine of oxycodone -acetaminophen  10-325 mg every 4-6 hours as needed.  She will continue working hard on reducing her A1c (5.8 today) and her weight loss to help offload the knees. F/u in 4 weeks.  Follow-up: Return in about 27 days (around 05/13/2024) for Schedule for US -L knee Zilretta  injection only from 11/20 or later.   Meds & Orders:  Meds ordered this encounter  Medications   lidocaine  (XYLOCAINE ) 1 % (with pres) injection 5 mL    Orders Placed This Encounter  Procedures   Large Joint Inj: R knee   US  Guided Needle Placement - No Linked Charges     Procedures: Large Joint Inj: R knee on 04/16/2024 3:07 PM Indications: pain Details: 22 G 3.5 in needle, ultrasound-guided superolateral approach Medications: 5 mL lidocaine  1 % (Zilretta  (triamcinolone  acetonide extended-release injectable suspension) 32mg  (5mL) NDC: 34749-996-98 ) Outcome: tolerated well, no immediate complications  *Procedurally-successful US -guided L-knee injection, with long-acting Zilretta  CSI   Zilretta  (triamcinolone  acetonide extended-release injectable suspension) 32mg  (5mL) NDC: 34749-996-98   *5 cc of lidocaine  1% was used for soft tissue numbing prior to injection  Procedure, treatment alternatives, risks and benefits explained, specific risks discussed. Consent was given by the patient. Patient was prepped and draped in the usual sterile fashion.          Clinical History: No specialty  comments available.  She reports that she quit smoking about 3 years ago. Her smoking use included cigarettes. She has never used smokeless tobacco.  Recent Labs    05/19/23 1546 07/24/23 1553 08/18/23 1507 10/07/23 1118  HGBA1C 5.8  --   --  5.8*  LABURIC  --  11.0* 8.9*  --      Objective:   Vital Signs: LMP 02/13/2014   Physical Exam  Gen: Well-appearing, in no acute distress; non-toxic CV: Well-perfused. Warm.  Resp: Breathing unlabored on room air; no wheezing. Psych: Fluid speech in conversation; appropriate affect; normal thought process  Ortho Exam  - Bilateral knees: Trace effusion of the right knee, negative effusion of the left knee.  Patient is largely nonmobile, brought today in a wheelchair visit.  Range of motion 3-120 degrees.  Imaging:  11/14/22: Knee XR's reviewed today during office visit (04/16/24) arrative & Impression  CLINICAL DATA:  bilateral knee pain   EXAM: Bilateral knees - four views   COMPARISON:  05/17/2021   FINDINGS: No evidence of fracture, dislocation, or joint effusion. There is bilateral tricompartmental joint space narrowing, sclerosis and osteophytes consistent with degenerative joint disease. No evidence of effusion.   IMPRESSION: Bilateral degenerative changes.     Electronically Signed   By: Fonda Field M.D.   On: 11/15/2022 21:19    Past Medical/Family/Surgical/Social History: Medications & Allergies reviewed per EMR, new medications updated. Patient Active Problem List   Diagnosis Date Noted   Class 3 severe obesity due to excess calories with body mass index (BMI) greater than or equal to 70 in adult [facility]) 02/20/2023   Chronic pain of both knees 11/08/2022   Osteoarthritis of both knees    Influenza A 05/17/2021   Flu 05/16/2021   Acute on chronic respiratory failure with hypoxia and hypercapnia (HCC) 04/17/2021   Abnormal CT of brain 04/17/2021   Polycythemia 04/17/2021   Chronic back pain 04/17/2021   Leukocytosis 04/17/2021   Episode of unresponsiveness 04/17/2021   Acute respiratory failure with hypoxia (HCC) 10/02/2020   Chronic diastolic CHF (congestive heart failure) (HCC) 04/29/2020   Essential hypertension 04/25/2020   Community acquired bacterial pneumonia 04/25/2020    Class 3 obesity (HCC) 04/25/2020   Diabetes mellitus due to underlying condition with diabetic autonomic neuropathy, unspecified whether long term insulin  use (HCC) 04/24/2020   Acute respiratory failure (HCC) 04/24/2020   Asthmatic bronchitis 03/21/2020   Nocturnal hypoxia 03/21/2020   OSA (obstructive sleep apnea) 12/22/2019   Shortness of breath 08/09/2019   No-show for appointment 06/30/2019   Morbid obesity with BMI of 60.0-69.9, adult (HCC) 09/14/2014   Iron deficiency anemia due to chronic blood loss 09/14/2014   Constipation 09/14/2014   Postoperative state 04/12/2014   Uterine fibroid 03/17/2014   DUB (dysfunctional uterine bleeding) 01/27/2014   Iron deficiency anemia due to chronic blood loss 04/09/2013   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   Menometrorrhagia 04/09/2013   Left hand pain 09/18/2011   High blood pressure    ANEMIA, IRON DEFICIENCY 08/06/2010   CHOLECYSTECTOMY, HX OF 04/09/2010   Dyslipidemia 02/13/2010   DEGENERATIVE DISC DISEASE, LUMBOSACRAL SPINE W/RADICULOPATHY 11/01/2009   BACK PAIN, LUMBAR, WITH RADICULOPATHY 10/30/2009   TRIGGER FINGER 02/03/2009   Carpal tunnel syndrome 12/21/2008   Headache(784.0) 11/09/2008   Depression 10/04/2008   URI 08/24/2008   INSOMNIA 08/24/2008   Asthma 06/06/2008   Morbid obesity (HCC) 05/24/2008   ESSENTIAL HYPERTENSION 05/24/2008   Past Medical History:  Diagnosis Date   Anemia  iv iron treatment dec 2014/ see oncology for this last in dec 2014   Anxiety    Arthritis    knee, hands   Asthma    seasonal related/ albuterol  used when uri   CHF (congestive heart failure) (HCC)    CN (constipation) 09/14/2014   DEGENERATIVE DISC DISEASE, LUMBOSACRAL SPINE W/RADICULOPATHY 11/01/2009   Qualifier: Diagnosis of  By: Lelon RIGGERS, Scott     Depression    Diabetes (HCC)    H/O dizziness    Hx   Headache(784.0)    otc med prn   High blood pressure    HYPERLIPIDEMIA 02/13/2010   Qualifier: Diagnosis of  By: Lelon RIGGERS, Scott     INSOMNIA 08/24/2008   Qualifier: Diagnosis of  By: Loretha MD, Victoria     Morbid obesity (HCC) 05/24/2008   Qualifier: Diagnosis of  By: Loretha MD, Victoria     SVD (spontaneous vaginal delivery)    x 3   Family History  Problem Relation Age of Onset   Diabetes Mother    Hypertension Father    Heart disease Other    Diabetes Other    Hypertension Other    Alcohol abuse Other    Hypertension Brother    Asthma Brother    Hypertension Brother    Asthma Brother    COPD Maternal Grandmother    Lung cancer Maternal Grandmother    Asthma Paternal Grandmother    Asthma Paternal Aunt    Past Surgical History:  Procedure Laterality Date   ABDOMINAL HYSTERECTOMY  03/13/2014   total    BILATERAL SALPINGECTOMY Bilateral 04/12/2014   Procedure: BILATERAL SALPINGECTOMY;  Surgeon: Lynwood KANDICE Solomons, MD;  Location: [facility];  Service: Gynecology;  Laterality: Bilateral;   CARPAL TUNNEL RELEASE  08/30/2011   Procedure: CARPAL TUNNEL RELEASE;  Surgeon: Arley JONELLE Curia, MD;  Location: [facility];  Service: Orthopedics;  Laterality: Right;   CHOLECYSTECTOMY  10/11   lap choli   GALLBLADDER SURGERY  04/04/2010   HYSTEROSCOPY N/A 02/08/2014   Procedure: HYSTEROSCOPY;  Surgeon: Lynwood KANDICE Solomons, MD;  Location: [facility];  Service: Gynecology;  Laterality: N/A;   HYSTEROSCOPY WITH NOVASURE N/A 02/08/2014   Procedure: ATTEMPTED NOVASURE ABLATION;  Surgeon: Lynwood KANDICE Solomons, MD;  Location: [facility];  Service: Gynecology;  Laterality: N/A;   INTRAUTERINE DEVICE INSERTION     07/19/2013   SEPTOPLASTY N/A 09/08/2013   Procedure: SEPTOPLASTY;  Surgeon: Merilee Kraft, MD;  Location: [facility] OR;  Service: ENT;  Laterality: N/A;   SPINE SURGERY     TONSILLECTOMY     TONSILLECTOMY AND ADENOIDECTOMY Bilateral 09/08/2013   Procedure: TONSILLECTOMY ;  Surgeon: Merilee Kraft, MD;  Location: [facility] OR;  Service: ENT;  Laterality: Bilateral;   TUBAL LIGATION     VAGINAL HYSTERECTOMY N/A 04/12/2014    Procedure: LAPAROSCOPIC ASSISTED VAGINAL HYSTERECTOMY;  Surgeon: Lynwood KANDICE Solomons, MD;  Location: [facility];  Service: Gynecology;  Laterality: N/A;   WISDOM TOOTH EXTRACTION     Social History   Occupational History   Not on file  Tobacco Use   Smoking status: Former    Current packs/day: 0.00    Types: Cigarettes    Quit date: 04/24/2020    Years since quitting: 3.9   Smokeless tobacco: Never   Tobacco comments:    4-5 cigarettes a day  Vaping Use   Vaping status: Never Used  Substance and Sexual Activity   Alcohol use: No   Drug use: No  Sexual activity: Not on file

## 2024-04-16 NOTE — Telephone Encounter (Signed)
 Patient called to get ok to continue metolazone  twice a week  Reports she was told to take an additional tablet as needed for swelling  Over the past few weeks swelling has increased -with extra tablet SOB gets better however swelling is still present   No weight available  Please advise

## 2024-04-17 MED ORDER — LIDOCAINE HCL 1 % IJ SOLN
5.0000 mL | INTRAMUSCULAR | Status: AC | PRN
  Administered 2024-04-16: 5 mL

## 2024-04-19 ENCOUNTER — Ambulatory Visit (INDEPENDENT_AMBULATORY_CARE_PROVIDER_SITE_OTHER): Payer: MEDICAID | Admitting: Nurse Practitioner

## 2024-04-19 ENCOUNTER — Encounter: Payer: Self-pay | Admitting: Nurse Practitioner

## 2024-04-19 VITALS — BP 117/72 | HR 91 | Wt >= 6400 oz

## 2024-04-19 DIAGNOSIS — E0843 Diabetes mellitus due to underlying condition with diabetic autonomic (poly)neuropathy: Secondary | ICD-10-CM

## 2024-04-19 DIAGNOSIS — L309 Dermatitis, unspecified: Secondary | ICD-10-CM

## 2024-04-19 DIAGNOSIS — M109 Gout, unspecified: Secondary | ICD-10-CM | POA: Diagnosis not present

## 2024-04-19 DIAGNOSIS — Z1329 Encounter for screening for other suspected endocrine disorder: Secondary | ICD-10-CM

## 2024-04-19 DIAGNOSIS — Z13220 Encounter for screening for lipoid disorders: Secondary | ICD-10-CM

## 2024-04-19 LAB — POCT GLYCOSYLATED HEMOGLOBIN (HGB A1C): Hemoglobin A1C: 5.6 % (ref 4.0–5.6)

## 2024-04-19 MED ORDER — TRIAMCINOLONE ACETONIDE 0.1 % EX CREA: 1.0000 | TOPICAL_CREAM | Freq: Two times a day (BID) | CUTANEOUS | 0 refills | Status: AC

## 2024-04-19 MED ORDER — DULOXETINE HCL 30 MG PO CPEP: 30.0000 mg | ORAL_CAPSULE | Freq: Every day | ORAL | 3 refills | Status: DC

## 2024-04-19 MED ORDER — ALLOPURINOL 100 MG PO TABS: 100.0000 mg | ORAL_TABLET | Freq: Every day | ORAL | 0 refills | Status: DC

## 2024-04-19 MED ORDER — CETIRIZINE HCL 10 MG PO TABS: 10.0000 mg | ORAL_TABLET | Freq: Every day | ORAL | 11 refills | Status: AC

## 2024-04-19 NOTE — Progress Notes (Signed)
 Subjective   Patient ID: Cynthia Becker, female    DOB: 06-09-1971, 53 y.o.   MRN: [id number]  Chief Complaint  Patient presents with   Diabetes   Medication Management    Would like to increase cymbalta  due to neuropathy at night    Eczema    Hands and legs, has used triamcinolone  cream before and has helped    Medication Refill    Cetirizine , allopurinol ,     Referring provider: Oley Bascom RAMAN, NP  Cynthia Becker is a 53 y.o. female with Past Medical History: No date: Anemia     Comment:  iv iron treatment dec 2014/ see oncology for this last               in dec 2014 No date: Anxiety No date: Arthritis     Comment:  knee, hands No date: Asthma     Comment:  seasonal related/ albuterol  used when uri No date: CHF (congestive heart failure) (HCC) 09/14/2014: CN (constipation) 11/01/2009: DEGENERATIVE DISC DISEASE, LUMBOSACRAL SPINE W/ RADICULOPATHY     Comment:  Qualifier: Diagnosis of  By: Lelon RIGGERS, Scott   No date: Depression No date: Diabetes (HCC) No date: H/O dizziness     Comment:  Hx No date: Headache(784.0)     Comment:  otc med prn No date: High blood pressure 02/13/2010: HYPERLIPIDEMIA     Comment:  Qualifier: Diagnosis of  By: Lelon RIGGERS, Scott   08/24/2008: INSOMNIA     Comment:  Qualifier: Diagnosis of  By: Loretha MD, Richerd   05/24/2008: Morbid obesity (HCC)     Comment:  Qualifier: Diagnosis of  By: Loretha MD, Victoria   No date: SVD (spontaneous vaginal delivery)     Comment:  x 3   HPI  Patient presents today for a follow-up visit for hypertension and diabetes.  Overall she has been doing well.   Patient followed with orthopedics. A1C at last visit was  5.9.  Denies f/c/s, n/v/d, hemoptysis, PND, leg swelling. Denies chest pain or edema.  Pharmacy is working with patient for diabetic medication management.  Does need refills today.   Note: Ongoing Eczema - will reorder kenalog  cream  A1c is 5.9        No Known  Allergies  Immunization History  Administered Date(s) Administered   Influenza Whole 04/25/2010   Influenza,inj,Quad PF,6+ Mos 03/07/2014, 03/10/2019   Pneumococcal Polysaccharide-23 04/13/2014   Td 08/02/2010    Tobacco History: Social History   Tobacco Use  Smoking Status Former   Current packs/day: 0.00   Types: Cigarettes   Quit date: 04/24/2020   Years since quitting: 3.9  Smokeless Tobacco Never  Tobacco Comments   4-5 cigarettes a day   Counseling given: Not Answered Tobacco comments: 4-5 cigarettes a day   Outpatient Encounter Medications as of 04/19/2024  Medication Sig   ACCU-CHEK FASTCLIX LANCETS MISC    ACCU-CHEK GUIDE TEST test strip USE 1 STRIP TO CHECK GLUCOSE TWICE DAILY   albuterol  (PROVENTIL ) (2.5 MG/3ML) 0.083% nebulizer solution USE 1 VIAL IN NEBULIZER EVERY 6 HOURS AS NEEDED FOR WHEEZING OR SHORTNESS OF BREATH   blood glucose meter kit and supplies KIT Dispense based on patient and insurance preference. Use up to four times daily as directed.   Blood Glucose Monitoring Suppl DEVI 1 each by Does not apply route 2 (two) times daily. May substitute to any manufacturer covered by patient's insurance.   cariprazine (VRAYLAR) 3 MG capsule Take 3 mg by mouth daily.  cetirizine  (ZYRTEC ) 10 MG tablet Take 1 tablet (10 mg total) by mouth daily.   DULoxetine  (CYMBALTA ) 30 MG capsule Take 1 capsule (30 mg total) by mouth daily.   DULoxetine  (CYMBALTA ) 30 MG capsule Take 1 capsule (30 mg total) by mouth daily.   ENTRESTO  49-51 MG Take 1 tablet by mouth 2 (two) times daily.   EQ ALLERGY RELIEF, CETIRIZINE , 10 MG tablet Take 1 tablet by mouth once daily   ibuprofen  (ADVIL ) 800 MG tablet Take 800 mg by mouth every 8 (eight) hours as needed for moderate pain.   metolazone  (ZAROXOLYN ) 2.5 MG tablet Take 1 tablet (2.5 mg total) by mouth once a week.   naloxone  (NARCAN ) nasal spray 4 mg/0.1 mL Place 1 spray into the nose as needed.   oxyCODONE -acetaminophen  (PERCOCET)  10-325 MG tablet Take 1 tablet by mouth every 4 (four) hours as needed for pain.   potassium chloride  SA (KLOR-CON  M) 20 MEQ tablet Take 1 tablet (20 mEq total) by mouth daily. TAKE AN EXTRA (2) TABLETS ON DAYS THAT YOU TAKE METOLAZONE  2.5MG    Semaglutide , 2 MG/DOSE, (OZEMPIC , 2 MG/DOSE,) 8 MG/3ML SOPN Inject 2 mg into the skin once a week.   spironolactone  (ALDACTONE ) 25 MG tablet Take 1 tablet (25 mg total) by mouth daily.   torsemide  (DEMADEX ) 20 MG tablet Take 3 tablets (60 mg total) by mouth 2 (two) times daily. (Patient taking differently: Take 3 tablets (60 mg total) by mouth 2 (two) times daily.)   ZTLIDO  1.8 % PTCH Apply 1 patch topically as needed (back pain).   [DISCONTINUED] allopurinol  (ZYLOPRIM ) 100 MG tablet Take 1 tablet by mouth once daily   [DISCONTINUED] triamcinolone  cream (KENALOG ) 0.1 % Apply 1 Application topically 2 (two) times daily.   allopurinol  (ZYLOPRIM ) 100 MG tablet Take 1 tablet (100 mg total) by mouth daily.   cyclobenzaprine  (FLEXERIL ) 10 MG tablet Take 10 mg by mouth 2 (two) times daily as needed for muscle spasms. (Patient not taking: Reported on 04/19/2024)   LINZESS  290 MCG CAPS capsule TAKE 1 CAPSULE BY MOUTH ONCE DAILY BEFORE BREAKFAST (Patient not taking: Reported on 04/19/2024)   triamcinolone  cream (KENALOG ) 0.1 % Apply 1 Application topically 2 (two) times daily.   [DISCONTINUED] DULoxetine  (CYMBALTA ) 20 MG capsule Take 1 capsule by mouth once daily (Patient not taking: Reported on 04/19/2024)   No facility-administered encounter medications on file as of 04/19/2024.    Review of Systems  Review of Systems  Constitutional: Negative.   HENT: Negative.    Cardiovascular: Negative.   Gastrointestinal: Negative.   Allergic/Immunologic: Negative.   Neurological: Negative.   Psychiatric/Behavioral: Negative.       Objective:   BP 117/72 (BP Location: Left Arm, Patient Position: Sitting, Cuff Size: Large)   Pulse 91   Wt (!) 417 lb (189.1  kg)   LMP 02/13/2014   SpO2 100%   BMI 73.87 kg/m   Wt Readings from Last 5 Encounters:  04/19/24 (!) 417 lb (189.1 kg)  01/07/24 (!) 422 lb 9.6 oz (191.7 kg)  12/22/23 (!) 420 lb (190.5 kg)  10/14/23 (!) 418 lb 6.4 oz (189.8 kg)  10/07/23 (!) 436 lb (197.8 kg)     Physical Exam Vitals and nursing note reviewed.  Constitutional:      General: She is not in acute distress.    Appearance: She is well-developed.  Cardiovascular:     Rate and Rhythm: Normal rate and regular rhythm.  Pulmonary:     Effort: Pulmonary effort is  normal.     Breath sounds: Normal breath sounds.  Neurological:     Mental Status: She is alert and oriented to person, place, and time.       Assessment & Plan:   Eczema, unspecified type -     Triamcinolone  Acetonide; Apply 1 Application topically 2 (two) times daily.  Dispense: 30 g; Refill: 0  Gout, unspecified cause, unspecified chronicity, unspecified site -     Allopurinol ; Take 1 tablet (100 mg total) by mouth daily.  Dispense: 30 tablet; Refill: 0  Diabetes mellitus due to underlying condition with diabetic autonomic neuropathy, unspecified whether long term insulin  use (HCC) -     CBC -     Comprehensive metabolic panel with GFR  Lipid screening -     Lipid panel  Thyroid disorder screen -     TSH  Other orders -     Cetirizine  HCl; Take 1 tablet (10 mg total) by mouth daily.  Dispense: 30 tablet; Refill: 11 -     DULoxetine  HCl; Take 1 capsule (30 mg total) by mouth daily.  Dispense: 60 capsule; Refill: 3     Return in about 3 months (around 07/20/2024).     Bascom GORMAN Borer, NP 04/19/2024

## 2024-04-20 LAB — LIPID PANEL
Chol/HDL Ratio: 4.4 ratio (ref 0.0–4.4)
Cholesterol, Total: 216 mg/dL — ABNORMAL HIGH (ref 100–199)
HDL: 49 mg/dL (ref >39)
LDL Chol Calc (NIH): 151 mg/dL — ABNORMAL HIGH (ref 0–99)
Triglycerides: 88 mg/dL (ref 0–149)
VLDL Cholesterol Cal: 16 mg/dL (ref 5–40)

## 2024-04-20 LAB — CBC
Hematocrit: 42.1 % (ref 34.0–46.6)
Hemoglobin: 13.1 g/dL (ref 11.1–15.9)
MCH: 23.8 pg — ABNORMAL LOW (ref 26.6–33.0)
MCHC: 31.1 g/dL — ABNORMAL LOW (ref 31.5–35.7)
MCV: 76 fL — ABNORMAL LOW (ref 79–97)
Platelets: 354 x10E3/uL (ref 150–450)
RBC: 5.51 x10E6/uL — ABNORMAL HIGH (ref 3.77–5.28)
RDW: 16.1 % — ABNORMAL HIGH (ref 11.7–15.4)
WBC: 9.2 x10E3/uL (ref 3.4–10.8)

## 2024-04-20 LAB — COMPREHENSIVE METABOLIC PANEL WITH GFR
ALT: 32 IU/L (ref 0–32)
AST: 23 IU/L (ref 0–40)
Albumin: 3.8 g/dL (ref 3.8–4.9)
Alkaline Phosphatase: 86 IU/L (ref 49–135)
BUN/Creatinine Ratio: 12 (ref 9–23)
BUN: 17 mg/dL (ref 6–24)
Bilirubin Total: 0.3 mg/dL (ref 0.0–1.2)
CO2: 29 mmol/L (ref 20–29)
Calcium: 9.3 mg/dL (ref 8.7–10.2)
Chloride: 95 mmol/L — ABNORMAL LOW (ref 96–106)
Creatinine, Ser: 1.37 mg/dL — ABNORMAL HIGH (ref 0.57–1.00)
Globulin, Total: 3.3 g/dL (ref 1.5–4.5)
Glucose: 89 mg/dL (ref 70–99)
Potassium: 4.5 mmol/L (ref 3.5–5.2)
Sodium: 137 mmol/L (ref 134–144)
Total Protein: 7.1 g/dL (ref 6.0–8.5)
eGFR: 46 mL/min/1.73 — ABNORMAL LOW (ref >59)

## 2024-04-20 LAB — TSH: TSH: 2.26 u[IU]/mL (ref 0.450–4.500)

## 2024-04-21 ENCOUNTER — Ambulatory Visit: Payer: Self-pay | Admitting: Nurse Practitioner

## 2024-04-23 NOTE — Telephone Encounter (Signed)
 Patient aware labs 11/5 Follow  11/18

## 2024-04-23 NOTE — Telephone Encounter (Signed)
 3 attempts  have been made to contact patient, all attempts unsuccessful. Will close encounter

## 2024-04-23 NOTE — Addendum Note (Signed)
 Addended by: Farhad Burleson, DALTON HERO on: 04/23/2024 04:47 PM   Modules accepted: Orders

## 2024-04-26 ENCOUNTER — Encounter: Payer: Self-pay | Admitting: Radiology

## 2024-04-28 ENCOUNTER — Ambulatory Visit (HOSPITAL_COMMUNITY)
Admission: RE | Admit: 2024-04-28 | Discharge: 2024-04-28 | Disposition: A | Discharge status: Home / Self Care | Payer: MEDICAID | Source: Ambulatory Visit | Attending: Cardiology | Admitting: Cardiology

## 2024-04-28 DIAGNOSIS — I5032 Chronic diastolic (congestive) heart failure: Secondary | ICD-10-CM | POA: Insufficient documentation

## 2024-04-28 LAB — BASIC METABOLIC PANEL WITH GFR
Anion gap: 15 (ref 5–15)
BUN: 22 mg/dL — ABNORMAL HIGH (ref 6–20)
CO2: 26 mmol/L (ref 22–32)
Calcium: 9 mg/dL (ref 8.9–10.3)
Chloride: 93 mmol/L — ABNORMAL LOW (ref 98–111)
Creatinine, Ser: 1.4 mg/dL — ABNORMAL HIGH (ref 0.44–1.00)
GFR, Estimated: 45 mL/min — ABNORMAL LOW (ref >60)
Glucose, Bld: 89 mg/dL (ref 70–99)
Potassium: 3.9 mmol/L (ref 3.5–5.1)
Sodium: 134 mmol/L — ABNORMAL LOW (ref 135–145)

## 2024-05-03 ENCOUNTER — Ambulatory Visit (HOSPITAL_COMMUNITY): Payer: Self-pay | Admitting: Family Medicine

## 2024-05-10 ENCOUNTER — Telehealth (HOSPITAL_COMMUNITY): Payer: Self-pay | Admitting: *Deleted

## 2024-05-10 NOTE — Progress Notes (Incomplete)
 [facility] NOTE   PCP: Oley Bascom RAMAN, NP Pulmonology: Dr. Gretta Primary Cardiologist: Dr Elmira  HF Cardiologist: Dr. Cherrie  HPI: Cynthia Becker is a 53 y.o. female w/ a history of chronic diastolic CHF, HTN, morbid obesity, type II DM, HLD tobacco use, asthma, chronic respiratory failure with hypoxia on supplemental O2.    She's had multiple admissions over the last few years with a/c respiratory failure with hypoxia secondary to a/c diastolic CHF.    Echo 4/22: EF 60-65%, RV not well visualized, study technically difficult d/t body habitus.   Admitted 12/22 with a/c respiratory failure with hypoxia secondary to influenza A pneumonia and a/c CHF. Diuresed with IV lasix . Discharged on torsemide  40 mg daily. Course complicated by encephalopathy, felt to be likely d/t CO2 narcosis vs narcotic use.   Follow up 12/22 with PCP, 20 lbs weight gain. Diuretics increased and referred to [facility] for evaluation.  Initial A[facility] clinic visit 3/23, markedly volume overloaded, weight 464 lbs. Spiro 12.5 added, and instructed to take metolazone  2x/week. Zio-14 day placed at follow up due to palpitations.  Used Furoscix  x 3 doses 7/21-7/23/23    She returns today for heart failure follow up with husband. Overall feeling ok, has good days and bad days. This has been a good week. NYHA III, limited by obesity and mobility. Called our office with significant weight gain in October, took metolazone  x3 days with improvement. Reports orthostatic hypotension and walking, dyspnea, fatigue, and edema . Denies chest pain, near-syncope, palpitations, and near falls. Able to perform ADLs. Appetite okay. Weight at home has been slowly coming down. Compliant with all medications. Has not been diuresing well with torsemide . Has lost a total of 80 lbs on Ozempic .  Cardiac Studies: - Echo (1023): EF 60-65% RV ok - Echo (4/22): EF 60-65%, RV not well visualized, study technically  difficult d/t body habitus. - Echo (11/21): EF 60-65%, moderate LVH, grade II DD, RV normal - Echo (2/21): EF 50-55%, grade II DD, moderate LVH, technically difficult study.  ROS: All systems reviewed and negative except as per HPI.   Past Medical History:  Diagnosis Date   Anemia    iv iron treatment dec 2014/ see oncology for this last in dec 2014   Anxiety    Arthritis    knee, hands   Asthma    seasonal related/ albuterol  used when uri   CHF (congestive heart failure) (HCC)    CN (constipation) 09/14/2014   DEGENERATIVE DISC DISEASE, LUMBOSACRAL SPINE W/RADICULOPATHY 11/01/2009   Qualifier: Diagnosis of  By: Lelon RIGGERS, Scott     Depression    Diabetes (HCC)    H/O dizziness    Hx   Headache(784.0)    otc med prn   High blood pressure    HYPERLIPIDEMIA 02/13/2010   Qualifier: Diagnosis of  By: Lelon RIGGERS [place]     INSOMNIA 08/24/2008   Qualifier: Diagnosis of  By: Loretha MD, Victoria     Morbid obesity (HCC) 05/24/2008   Qualifier: Diagnosis of  By: Loretha MD, Victoria     SVD (spontaneous vaginal delivery)    x 3   Current Outpatient Medications  Medication Sig Dispense Refill   ACCU-CHEK FASTCLIX LANCETS MISC   11   ACCU-CHEK GUIDE TEST test strip USE 1 STRIP TO CHECK GLUCOSE TWICE DAILY 100 each 0   albuterol  (PROVENTIL ) (2.5 MG/3ML) 0.083% nebulizer solution USE 1 VIAL IN NEBULIZER EVERY 6 HOURS AS NEEDED FOR WHEEZING OR  SHORTNESS OF BREATH 150 mL 1   allopurinol  (ZYLOPRIM ) 100 MG tablet Take 1 tablet (100 mg total) by mouth daily. 30 tablet 0   blood glucose meter kit and supplies KIT Dispense based on patient and insurance preference. Use up to four times daily as directed. 1 each 0   Blood Glucose Monitoring Suppl DEVI 1 each by Does not apply route 2 (two) times daily. May substitute to any manufacturer covered by patient's insurance. 1 each 0   cariprazine (VRAYLAR) 3 MG capsule Take 3 mg by mouth daily.     cetirizine  (ZYRTEC ) 10 MG tablet Take 1 tablet (10 mg  total) by mouth daily. 30 tablet 11   cyclobenzaprine  (FLEXERIL ) 10 MG tablet Take 10 mg by mouth 2 (two) times daily as needed for muscle spasms.     DULoxetine  (CYMBALTA ) 30 MG capsule Take 1 capsule (30 mg total) by mouth daily. 30 capsule 3   ENTRESTO  49-51 MG Take 1 tablet by mouth 2 (two) times daily. 180 tablet 0   EQ ALLERGY RELIEF, CETIRIZINE , 10 MG tablet Take 1 tablet by mouth once daily 90 tablet 0   ibuprofen  (ADVIL ) 800 MG tablet Take 800 mg by mouth every 8 (eight) hours as needed for moderate pain.     LINZESS  290 MCG CAPS capsule TAKE 1 CAPSULE BY MOUTH ONCE DAILY BEFORE BREAKFAST 30 capsule 0   metolazone  (ZAROXOLYN ) 2.5 MG tablet Take 1 tablet (2.5 mg total) by mouth once a week. 12 tablet 3   naloxone  (NARCAN ) nasal spray 4 mg/0.1 mL Place 1 spray into the nose as needed.     oxyCODONE -acetaminophen  (PERCOCET) 10-325 MG tablet Take 1 tablet by mouth every 4 (four) hours as needed for pain.     potassium chloride  SA (KLOR-CON  M) 20 MEQ tablet Take 1 tablet (20 mEq total) by mouth daily. TAKE AN EXTRA (2) TABLETS ON DAYS THAT YOU TAKE METOLAZONE  2.5MG  30 tablet 11   Semaglutide , 2 MG/DOSE, (OZEMPIC , 2 MG/DOSE,) 8 MG/3ML SOPN Inject 2 mg into the skin once a week. 9 mL 3   spironolactone  (ALDACTONE ) 25 MG tablet Take 1 tablet (25 mg total) by mouth daily. 90 tablet 3   torsemide  (DEMADEX ) 20 MG tablet Take 3 tablets (60 mg total) by mouth 2 (two) times daily. 565 tablet 2   triamcinolone  cream (KENALOG ) 0.1 % Apply 1 Application topically 2 (two) times daily. 30 g 0   ZTLIDO  1.8 % PTCH Apply 1 patch topically as needed (back pain).     No current facility-administered medications for this encounter.   No Known Allergies  Social History   Socioeconomic History   Marital status: Married    Spouse name: Not on file   Number of children: 3   Years of education: Not on file   Highest education level: Not on file  Occupational History   Not on file  Tobacco Use    Smoking status: Former    Current packs/day: 0.00    Types: Cigarettes    Quit date: 04/24/2020    Years since quitting: 4.0   Smokeless tobacco: Never   Tobacco comments:    4-5 cigarettes a day  Vaping Use   Vaping status: Never Used  Substance and Sexual Activity   Alcohol use: No   Drug use: No   Sexual activity: Not on file  Other Topics Concern   Not on file  Social History Narrative   Not on file   Social Drivers of Health  Financial Resource Strain: Medium Risk (03/01/2024)   Received from [facility]   Overall Financial Resource Strain (CARDIA)    How hard is it for you to pay for the very basics like food, housing, medical care, and heating?: Somewhat hard  Food Insecurity: No Food Insecurity (03/01/2024)   Received from [facility]   Hunger Vital Sign    Within the past 12 months, you worried that your food would run out before you got the money to buy more.: Never true    Within the past 12 months, the food you bought just didn't last and you didn't have money to get more.: Never true  Transportation Needs: No Transportation Needs (03/01/2024)   Received from [facility] - Transportation    In the past 12 months, has lack of transportation kept you from medical appointments or from getting medications?: No    In the past 12 months, has lack of transportation kept you from meetings, work, or from getting things needed for daily living?: No  Physical Activity: Not on file  Stress: Not on file  Social Connections: Not on file  Intimate Partner Violence: Not on file   Family History  Problem Relation Age of Onset   Diabetes Mother    Hypertension Father    Heart disease Other    Diabetes Other    Hypertension Other    Alcohol abuse Other    Hypertension Brother    Asthma Brother    Hypertension Brother    Asthma Brother    COPD Maternal Grandmother    Lung cancer Maternal Grandmother    Asthma Paternal Grandmother    Asthma Paternal Aunt     Wt Readings from Last 3 Encounters:  05/11/24 (!) 184.3 kg (406 lb 4 oz)  04/19/24 (!) 189.1 kg (417 lb)  01/07/24 (!) 191.7 kg (422 lb 9.6 oz)   BP 116/72   Pulse 92   Wt (!) 184.3 kg (406 lb 4 oz)   LMP 02/13/2014   SpO2 92%   BMI 71.96 kg/m   PHYSICAL EXAM: General: Well appearing. No distress  Cardiac: JVP flat. No murmurs  Resp: Lung sounds clear and equal B/L Extremities: Warm and dry.  1+ BLE edema up to thighs.  Neuro: A&O x3. Affect pleasant. Arrived by [facility]  ECG (personally reviewed): NSR 90 bpm  ASSESSMENT & PLAN: 1. Chronic HFpEF  - Echo (4/22): EF 60-65%, RV poorly visualized. - Echo (10/23): EF 60-65% RV ok  - NYHA III-IIIb, confounded by super morbid obesity and deconditioning. Volume ok, mildly up.  - continue metolazone  2.5 mg + 40 KCL once a week, occasionally will take an additional PRN - Increase torsemide  to 100/80 + KCL 20 daily - continue spironolactone  25 mg daily, move to bedtime to assist with dizziness  - continue Entresto  49/51 bid. Sitting BP 120/80, standing 116/76. - No SGLT2i due hygeine  - BMET/BNP, repeat in 2 weeks - schedule repeat echo - discussed at length that she struggles with fluctuant fluid and associated symptoms. Discussed Cardiomems purpose and use with her and her husband, she is interested. Discussed RHC procedure. Will continue to discuss with Dr. Bensimhon at follow up.  2. HTN - BP stable. - Meds as above   3. Morbid Obesity - Body mass index is 71.96 kg/m. - on Ozempic , lost 80 lbs so far. congratulated   4. DM II - Followed by PCP. - On insulin . - Continue Ozempic    Follow up in 2  months with Dr. Cherrie, update echo.  Nadeem Romanoski, NP 11:28 AM

## 2024-05-10 NOTE — Telephone Encounter (Signed)
 Called to confirm/remind patient of their appointment at the [facility] on 05/11/24:       Appointment:              [] Confirmed             [x] Left mess              [] No answer/No voice mail             [] Phone not in service   Patient reminded to bring all medications and/or complete list.   Confirmed patient has transportation. Gave directions, instructed to utilize valet parking.

## 2024-05-11 ENCOUNTER — Ambulatory Visit (HOSPITAL_COMMUNITY): Payer: MEDICAID

## 2024-05-11 ENCOUNTER — Ambulatory Visit (HOSPITAL_COMMUNITY): Payer: Self-pay | Admitting: Cardiology

## 2024-05-11 ENCOUNTER — Ambulatory Visit (HOSPITAL_COMMUNITY)
Admission: RE | Admit: 2024-05-11 | Discharge: 2024-05-11 | Disposition: A | Payer: MEDICAID | Source: Ambulatory Visit | Attending: Cardiology

## 2024-05-11 ENCOUNTER — Encounter (HOSPITAL_COMMUNITY): Payer: Self-pay

## 2024-05-11 VITALS — BP 116/72 | HR 92 | Wt >= 6400 oz

## 2024-05-11 DIAGNOSIS — J45909 Unspecified asthma, uncomplicated: Secondary | ICD-10-CM | POA: Diagnosis not present

## 2024-05-11 DIAGNOSIS — Z6841 Body Mass Index (BMI) 40.0 and over, adult: Secondary | ICD-10-CM | POA: Diagnosis not present

## 2024-05-11 DIAGNOSIS — E785 Hyperlipidemia, unspecified: Secondary | ICD-10-CM | POA: Diagnosis not present

## 2024-05-11 DIAGNOSIS — I10 Essential (primary) hypertension: Secondary | ICD-10-CM

## 2024-05-11 DIAGNOSIS — I5032 Chronic diastolic (congestive) heart failure: Secondary | ICD-10-CM

## 2024-05-11 DIAGNOSIS — Z794 Long term (current) use of insulin: Secondary | ICD-10-CM | POA: Diagnosis not present

## 2024-05-11 DIAGNOSIS — Z79899 Other long term (current) drug therapy: Secondary | ICD-10-CM | POA: Insufficient documentation

## 2024-05-11 DIAGNOSIS — E119 Type 2 diabetes mellitus without complications: Secondary | ICD-10-CM | POA: Insufficient documentation

## 2024-05-11 DIAGNOSIS — Z7985 Long-term (current) use of injectable non-insulin antidiabetic drugs: Secondary | ICD-10-CM | POA: Diagnosis not present

## 2024-05-11 DIAGNOSIS — E0843 Diabetes mellitus due to underlying condition with diabetic autonomic (poly)neuropathy: Secondary | ICD-10-CM | POA: Diagnosis not present

## 2024-05-11 DIAGNOSIS — J9611 Chronic respiratory failure with hypoxia: Secondary | ICD-10-CM | POA: Diagnosis not present

## 2024-05-11 DIAGNOSIS — I110 Hypertensive heart disease with heart failure: Secondary | ICD-10-CM | POA: Insufficient documentation

## 2024-05-11 DIAGNOSIS — Z87891 Personal history of nicotine dependence: Secondary | ICD-10-CM | POA: Insufficient documentation

## 2024-05-11 LAB — BASIC METABOLIC PANEL WITH GFR
Anion gap: 18 — ABNORMAL HIGH (ref 5–15)
BUN: 27 mg/dL — ABNORMAL HIGH (ref 6–20)
CO2: 24 mmol/L (ref 22–32)
Calcium: 9.4 mg/dL (ref 8.9–10.3)
Chloride: 92 mmol/L — ABNORMAL LOW (ref 98–111)
Creatinine, Ser: 1.44 mg/dL — ABNORMAL HIGH (ref 0.44–1.00)
GFR, Estimated: 43 mL/min — ABNORMAL LOW (ref >60)
Glucose, Bld: 93 mg/dL (ref 70–99)
Potassium: 3.6 mmol/L (ref 3.5–5.1)
Sodium: 134 mmol/L — ABNORMAL LOW (ref 135–145)

## 2024-05-11 LAB — BRAIN NATRIURETIC PEPTIDE: B Natriuretic Peptide: 3.9 pg/mL (ref 0.0–100.0)

## 2024-05-11 MED ORDER — TORSEMIDE 20 MG PO TABS: ORAL_TABLET | ORAL | 2 refills | Status: DC

## 2024-05-11 MED ORDER — SPIRONOLACTONE 25 MG PO TABS: 25.0000 mg | ORAL_TABLET | Freq: Every day | ORAL | Status: DC

## 2024-05-11 NOTE — Addendum Note (Signed)
 Encounter addended by: Keiva Dina, NP on: 05/11/2024 12:04 PM  Actions taken: Clinical Note Signed

## 2024-05-11 NOTE — Patient Instructions (Signed)
 Medication Changes:  INCREASE Torsemide  to 100 mg (5 tabs) in AM and 80 mg (4 tabs) in PM  MOVE Spironolactone  to 25 mg daily AT BEDTIME  Lab Work:  Labs done today, your results will be available in MyChart, we will contact you for abnormal readings.  Your physician recommends that you return for lab work in: 2 weeks  Testing/Procedures:  Your physician has requested that you have an echocardiogram. Echocardiography is a painless test that uses sound waves to create images of your heart. It provides your doctor with information about the size and shape of your heart and how well your heart's chambers and valves are working. This procedure takes approximately one hour. There are no restrictions for this procedure. Please do NOT wear cologne, perfume, aftershave, or lotions (deodorant is allowed). Please arrive 15 minutes prior to your appointment time. IN 2 MONTHS  Please note: We ask at that you not bring children with you during ultrasound (echo/ vascular) testing. Due to room size and safety concerns, children are not allowed in the ultrasound rooms during exams. Our front office staff cannot provide observation of children in our lobby area while testing is being conducted. An adult accompanying a patient to their appointment will only be allowed in the ultrasound room at the discretion of the ultrasound technician under special circumstances. We apologize for any inconvenience.  Special Instructions // Education:  Do the following things EVERYDAY: Weigh yourself in the morning before breakfast. Write it down and keep it in a log. Take your medicines as prescribed Eat low salt foods--Limit salt (sodium) to 2000 mg per day.  Stay as active as you can everyday Limit all fluids for the day to less than 2 liters   Follow-Up in: 2 months with an echocardiogram (January 2026), **PLEASE CALL OUR OFFICE IN DECEMBER TO SCHEDULE THESE APPOINTMENTS   At the [facility],  you and your health needs are our priority. We have a designated team specialized in the treatment of Heart Failure. This Care Team includes your primary Heart Failure Specialized Cardiologist (physician), Advanced Practice Providers (APPs- Physician Assistants and Nurse Practitioners), and Pharmacist who all work together to provide you with the care you need, when you need it.   You may see any of the following providers on your designated Care Team at your next follow up:  Dr. Toribio Fuel Dr. Ezra Shuck Dr. Odis Brownie Greig Mosses, NP Caffie Shed, [place] [facility] [place], [place] Beckey Coe, NP Jordan Lee, NP Tinnie Redman, PharmD   Please be sure to bring in all your medications bottles to every appointment.   Need to Contact Us :  If you have any questions or concerns before your next appointment please send us  a message through [place] or call our office at [phone].    TO LEAVE A MESSAGE FOR THE NURSE SELECT OPTION 2, PLEASE LEAVE A MESSAGE INCLUDING: YOUR NAME DATE OF BIRTH CALL BACK NUMBER REASON FOR CALL**this is important as we prioritize the call backs  YOU WILL RECEIVE A CALL BACK THE SAME DAY AS LONG AS YOU CALL BEFORE 4:00 PM

## 2024-05-13 ENCOUNTER — Encounter: Payer: Self-pay | Admitting: Sports Medicine

## 2024-05-13 ENCOUNTER — Ambulatory Visit: Payer: MEDICAID | Admitting: Sports Medicine

## 2024-05-13 ENCOUNTER — Other Ambulatory Visit: Payer: Self-pay

## 2024-05-13 DIAGNOSIS — M1712 Unilateral primary osteoarthritis, left knee: Secondary | ICD-10-CM | POA: Diagnosis not present

## 2024-05-13 DIAGNOSIS — M25562 Pain in left knee: Secondary | ICD-10-CM

## 2024-05-13 DIAGNOSIS — M170 Bilateral primary osteoarthritis of knee: Secondary | ICD-10-CM

## 2024-05-13 DIAGNOSIS — G8929 Other chronic pain: Secondary | ICD-10-CM

## 2024-05-13 MED ORDER — LIDOCAINE HCL 1 % IJ SOLN
2.0000 mL | INTRAMUSCULAR | Status: AC | PRN
  Administered 2024-05-13: 2 mL

## 2024-05-13 MED ORDER — BUPIVACAINE HCL 0.25 % IJ SOLN
2.0000 mL | INTRAMUSCULAR | Status: AC | PRN
  Administered 2024-05-13: 2 mL via INTRA_ARTICULAR

## 2024-05-13 NOTE — Progress Notes (Signed)
   Procedure Note  Patient: Cynthia Becker             Date of Birth: Mar 27, 1971           MRN: [id number]             Visit Date: 05/13/2024  Procedures: Visit Diagnoses:  1. Chronic pain of left knee   2. Bilateral primary osteoarthritis of knee    Large Joint Inj: L knee on 05/13/2024 3:51 PM Indications: pain Details: 22 G 3.5 in needle, ultrasound-guided superolateral approach Medications: 2 mL lidocaine  1 %; 2 mL bupivacaine  0.25 % (*Procedurally-successful US -guided L-knee injection, with long-acting Zilretta  CSI   Zilretta  (triamcinolone  acetonide extended-release injectable suspension) 32mg  (5mL) NDC: 34749-996-98   *5 cc of lidocaine  1% was used for soft tissue numbing prior to injection ) Outcome: tolerated well, no immediate complications Procedure, treatment alternatives, risks and benefits explained, specific risks discussed. Consent was given by the patient. Patient was prepped and draped in the usual sterile fashion.      The patient would benefit from a long-acting corticosteroid injection, I.e. Zilretta , as they have tried topical and oral anti-inflammatories such as , Ibuprofen , Tylenol , Cochicine, currently on Percocet  without much relief. The have undergone nonpharmacologic therapy with lifestyle modification, home exercise or physical therapy, and attempted weight loss and exercise regimens without significant relief. They have failed long-term benefit from previous corticosteroid injection therapy (minimal or only temporary relief).  For these reasons, I do think they are an appropriate candidate for Zilretta .  We will send authorization through the insurance company, will notify patient when this is approved and we will schedule in clinic for injection therapy at this time when works for the patient.    - patient tolerated procedure well, discussed post-injection protocol - follow-up  in 40-months for re-evaluation of b/l knees; continue with Zilretta  as long as  continuing to work well  Cynthia Sprang, DO Primary Care Sports Medicine Physician  [facility] - Orthopedics  This note was dictated using Dragon naturally speaking software and may contain errors in syntax, spelling, or content which have not been identified prior to signing this note.

## 2024-05-14 ENCOUNTER — Ambulatory Visit (INDEPENDENT_AMBULATORY_CARE_PROVIDER_SITE_OTHER): Payer: MEDICAID | Admitting: Podiatry

## 2024-05-14 ENCOUNTER — Other Ambulatory Visit: Payer: MEDICAID

## 2024-05-14 DIAGNOSIS — M109 Gout, unspecified: Secondary | ICD-10-CM

## 2024-05-14 DIAGNOSIS — E0843 Diabetes mellitus due to underlying condition with diabetic autonomic (poly)neuropathy: Secondary | ICD-10-CM

## 2024-05-14 DIAGNOSIS — L600 Ingrowing nail: Secondary | ICD-10-CM

## 2024-05-14 MED ORDER — DULOXETINE HCL 60 MG PO CPEP
60.0000 mg | ORAL_CAPSULE | Freq: Every day | ORAL | 3 refills | Status: AC
Start: 2024-05-14 — End: ?

## 2024-05-14 MED ORDER — ALLOPURINOL 100 MG PO TABS: 100.0000 mg | ORAL_TABLET | Freq: Every day | ORAL | 3 refills | Status: DC

## 2024-05-14 NOTE — Progress Notes (Signed)
 05/14/2024 Name: Cynthia Becker MRN: [id number] DOB: [birth year]  Chief Complaint  Patient presents with   Congestive Heart Failure    Cynthia Becker is a 53 y.o. year old female who presented for a telephone visit.   They were referred to the pharmacist by their PCP for assistance in managing diabetes, hypertension, and hyperlipidemia. [name] PMH includes HTN, HFpEF, asthma, OSA, T2DM, HLD, BMI > 60, chronic respiratory failure on O2.    Subjective: Patient was last seen by PCP, Bascom Borer, NP, on 05/20/24. A1C was 5.6%. She saw HF clinic (Jordan Lee, NP) on 05/11/24. She was instructed to increase torsemide  to 100 mg in the am (5 tabs) 80 mg in the pm (4 tabs) and take potassium 20 mEq daily. Continued metolazone  + 40 meQ K once per week. She was having some dizziness and was advised to switch spironolactone  25 mg to the evening. Orthostatic vitals were WNL. Planning possible Cardiomems and RHC in the future. BNP was WNL and BMP was stable. She was advised to return for repeat labs in 2 weeks after increasing torsemide .  Today, patient reports doing ok. She has dropped a few pounds, may be from fluid. She is taking the higher dose of torsemide . Has only tried spironolactone  in the evening for 1 day, no difference in dizziness yet. She reports she needs refill of allopurinol  and would like to try higher dose of duloxetine  for nerve pain as she wakes up around 11:30PM most evenings with sharp pain.   Care Team: Primary Care Provider: Borer Bascom RAMAN, NP ; Next Scheduled Visit: 07/01/24  Medication Access/Adherence  Current Pharmacy:  [facility] [address] [place] - [address] [place] [address] Phone: [phone] Fax: [phone]   Patient reports affordability concerns with their medications: No  Patient reports access/transportation concerns to their pharmacy: Yes  Patient reports adherence concerns with their medications:  No     Diabetes:  Current medications: Ozempic  2 mg weekly  Continues to tolerate Ozmepic well - needs to lose 90 lbs to be considered for weight loss surgery  Current glucose readings:  Using glucometer; testing once daily -  FBG continue to be controlled  Patient denies hypoglycemic s/sx including dizziness, shakiness, sweating. Patient denies hyperglycemic symptoms including polyuria, polydipsia, polyphagia, nocturia, neuropathy, blurred vision.  Current meal patterns: Usually does not get hungry until ~2PM. Feels like she has been struggling with eating more carbohydrates recently. Some days she has just had fruit (watermelon, cherries, grapes, plums). Denies recent changes in diet, but will be attending nutrition class on 03/15/24. - Breakfast: skips usually - Lunch: eats salad with turkey or grilled chicken - Supper: Likes cabbage, green beans, zucchini, greens for vegetables. Eats meat usually once/day at dinner. Sometimes rice. - Drinks: currently having about 30 oz of apple juice and 30 oz of water per day  Current physical activity: limited by mobility (wheelchair bound). She is getting injections for her knees- so this helps her pain, and hopes she will be able to be more active.  Current medication access support: Medicaid insurance  Heart Failure (EF 60-65% in 2023):  Current medications:  ACEi/ARB/ARNI: Entresto  49-51 mg BID - confirms she is taking BID now, no refills remaining SGLT2i: none d/t concern for GU AE Beta blocker: none Mineralocorticoid Receptor Antagonist: spironolactone  25 mg daily Diuretic regimen: torsemide  100 mg in the AM + 80 mg in the PM,  metolazone  2.5 mg once weekly (Monday) - she does occasionally  skip doses of diuretics due to needing to go to appts or run errands. She reports she is going to run out over the week and [facility] cannot fill until Monday Potassium: 20 mEq once daily (except 40 mEq with metolazone )   Reports her fluid status appears to  be downtrending at the moment with increased torsemide . Hx of fluctuating fluid status. Denies worsening ShOB.   Has been trying to follow 2 L fluid restriction: Having~ 30 oz of apple juice and ~30 oz water per day   Objective:  BP Readings from Last 3 Encounters:  05/11/24 116/72  04/19/24 117/72  01/07/24 101/73    Lab Results  Component Value Date   HGBA1C 5.6 04/19/2024   HGBA1C 5.8 (A) 10/07/2023   HGBA1C 5.8 05/19/2023       Latest Ref Rng & Units 05/11/2024   12:16 PM 04/28/2024    3:52 PM 04/19/2024    4:02 PM  BMP  Glucose 70 - 99 mg/dL 93  89  89   BUN 6 - 20 mg/dL 27  22  17    Creatinine 0.44 - 1.00 mg/dL 8.55  8.59  8.62   BUN/Creat Ratio 9 - 23   12   Sodium 135 - 145 mmol/L 134  134  137   Potassium 3.5 - 5.1 mmol/L 3.6  3.9  4.5   Chloride 98 - 111 mmol/L 92  93  95   CO2 22 - 32 mmol/L 24  26  29    Calcium  8.9 - 10.3 mg/dL 9.4  9.0  9.3     Lab Results  Component Value Date   CHOL 216 (H) 04/19/2024   HDL 49 04/19/2024   LDLCALC 151 (H) 04/19/2024   TRIG 88 04/19/2024   CHOLHDL 4.4 04/19/2024    Medications Reviewed Today     Reviewed by Brinda Lorain SQUIBB, RPH (Pharmacist) on 05/14/24 at 1125  Med List Status: <None>   Medication Order Taking? Sig Documenting Provider Last Dose Status Informant  ACCU-CHEK FASTCLIX LANCETS MISC [id number]   [provider]  Active Spouse/Significant Other  ACCU-CHEK GUIDE TEST test strip [id number]  USE 1 STRIP TO CHECK GLUCOSE TWICE DAILY [name], [name], NP  Active   albuterol  (PROVENTIL ) (2.5 MG/3ML) 0.083% nebulizer solution [id number]  USE 1 VIAL IN NEBULIZER EVERY 6 HOURS AS NEEDED FOR WHEEZING OR SHORTNESS OF BREATH [place], [name], NP  Active   allopurinol  (ZYLOPRIM ) 100 MG tablet [id number]  Take 1 tablet (100 mg total) by mouth daily. Oley Bascom RAMAN, NP  Active   blood glucose meter kit and supplies KIT [id number]  Dispense based on patient and insurance preference. Use up to four times daily as  directed. Oley Bascom RAMAN, NP  Active   Blood Glucose Monitoring Suppl DEVI [id number]  1 each by Does not apply route 2 (two) times daily. May substitute to any manufacturer covered by patient's insurance. Oley Bascom RAMAN, NP  Active   cariprazine (VRAYLAR) 3 MG capsule [id number]  Take 3 mg by mouth daily. [provider]  Active   cetirizine  (ZYRTEC ) 10 MG tablet [id number]  Take 1 tablet (10 mg total) by mouth daily. Oley Bascom RAMAN, NP  Active   cyclobenzaprine  (FLEXERIL ) 10 MG tablet [id number]  Take 10 mg by mouth 2 (two) times daily as needed for muscle spasms. [provider]  Active   DULoxetine  (CYMBALTA ) 30 MG capsule [id number] Yes Take 1 capsule (30 mg total) by mouth daily. Oley Bascom RAMAN, NP  Active   ENTRESTO  49-51 MG [id number] Yes Take 1 tablet by mouth 2 (two) times daily. Bensimhon, Toribio SAUNDERS, MD  Active   EQ ALLERGY RELIEF, CETIRIZINE , 10 MG tablet [id number]  Take 1 tablet by mouth once daily [name], [name], NP  Active   ibuprofen  (ADVIL ) 800 MG tablet [id number]  Take 800 mg by mouth every 8 (eight) hours as needed for moderate pain. [provider]  Active Spouse/Significant Other           Med Note MAYER SPEAKS R   Tue Nov 11, 2023  9:22 AM) Taking PRN  LINZESS  290 MCG CAPS capsule [id number] Yes TAKE 1 CAPSULE BY MOUTH ONCE DAILY BEFORE BREAKFAST [name], [name], NP  Active   metolazone  (ZAROXOLYN ) 2.5 MG tablet [id number] Yes Take 1 tablet (2.5 mg total) by mouth once a week. [place], [place], [place]  Active   naloxone  (NARCAN ) nasal spray 4 mg/0.1 mL [id number]  Place 1 spray into the nose as needed. [provider]  Active Spouse/Significant Other  oxyCODONE -acetaminophen  (PERCOCET) 10-325 MG tablet [id number]  Take 1 tablet by mouth every 4 (four) hours as needed for pain. [provider]  Active   potassium chloride  SA (KLOR-CON  M) 20 MEQ tablet [id number] Yes Take 1 tablet (20 mEq total) by mouth daily. TAKE AN EXTRA (2)  TABLETS ON DAYS THAT YOU TAKE METOLAZONE  2.5MG  Marcine Caffie HERO, PA-C  Active   Semaglutide , 2 MG/DOSE, (OZEMPIC , 2 MG/DOSE,) 8 MG/3ML SOPN [id number] Yes Inject 2 mg into the skin once a week. Oley Bascom RAMAN, NP  Active   spironolactone  (ALDACTONE ) 25 MG tablet [id number] Yes Take 1 tablet (25 mg total) by mouth at bedtime. Lee, Jordan, NP  Active   torsemide  (DEMADEX ) 20 MG tablet [id number] Yes Take 100 mg (5 tabs) in AM and 80 mg (4 tabs) in PM Lee, Jordan, NP  Active   triamcinolone  cream (KENALOG ) 0.1 % [id number]  Apply 1 Application topically 2 (two) times daily. Oley Bascom RAMAN, NP  Active   ZTLIDO  1.8 % [facility] [id number]  Apply 1 patch topically as needed (back pain). [provider]  Active               Assessment/Plan:   Diabetes: - Currently controlled with most recent A1C of 5.6% below goal <7%. Medication adherence appears appropriate. She desires continued treatment with GLP-1RA for weight loss. She is tolerating Ozempic  2 mg well. Her sleep study did not indicate severe enough OSA to pursue coverage of Zepbound. Patient is reporting worsened neuropathy and would like to try higher dose of duloxetine . - Last UACR: due at next PCP appointment - Patient denies personal or family history of multiple endocrine neoplasia type 2, medullary thyroid cancer; personal history of pancreatitis or gallbladder disease. - Reviewed long term cardiovascular and renal outcomes of uncontrolled blood sugar - Reviewed goal A1c, goal fasting, and goal 2 hour post prandial glucose - Reviewed lifestyle modifications including: aiming for 150 minutes of moderate intensity exercise every week.  - Recommend to continue Ozempic  2 mg weekly. - Recommend to increase duloxetine  to 60 mg once daily - Recommend to check glucose once daily fasting. Counseled patient to bring glucometer or BG log to every appointment. - Next A1C due April 2026   Heart Failure: - Currently appropriately managed.  Difficult to control fluid status. Recently seen by Adv HF clinic who adjusted her torsemide . No changes were made to GDMT given softer BP and patient reported dizziness, though  could consider titrating Entresto , spironolactone  in the future to maintain fluid balance if BP allows. - Reviewed appropriate blood pressure monitoring technique and reviewed goal blood pressure - Reviewed to weigh daily and when to contact cardiology with weight gain - Recommended patient to call [facility] to request partial fill of torsemide  if she is going to run out over the weekend - Recommend to continue current regimen:   ACEi/ARB/ARNI: Entresto  49-51 mg BID - noted no refills remaining, will request from Adv HF provider SGLT2i: none d/t concern for GU AE/hygiene Beta blocker: none Mineralocorticoid Receptor Antagonist: spironolactone  25 mg daily Diuretic regimen: torsemide  100 mg in the AM + 80 mg in the PM, metolazone  2.5 mg once weekly (Mondays)  Potassium: 20 mEq once daily (except 40 mEq with metolazone ) - Scheduled for repeat BMP with Adv HF clinic in ~2 weeks   Gout  - Currently uncontrolled with last uric acid level of 8.9 mg/dL on allopurinol  100 mg daily, above goal < 6 mg/dL. She had previously not been taking every day. Reports she is due for a refill today - Will collaborate with PCP to refill allopurinol  100 mg daily - Recommend rechecking uric acid level at PCP follow-up and titrate allopurinol  if needed   Follow Up Plan:  Pharmacist telephone 04/16/24 PCP clinic visit 04/08/24   Lorain Baseman, PharmD [facility] [phone]

## 2024-05-14 NOTE — Progress Notes (Unsigned)
 Subjective:  Patient ID: Cynthia Becker, female    DOB: [birth year],  MRN: [id number]  Chief Complaint  Patient presents with   Nail Problem    53 y.o. female presents with the above complaint.  Patient presents for right hallux ingrown nail both medial and lateral side the whole nail is causing a lot of discomfort.  She would like to have it removed would like to make a permanent.  She has not seen anyone as prior to seeing me denies any other acute issues.  Pain scale 7 out of 10 dull aching nature   Review of Systems: Negative except as noted in the HPI. Denies N/V/F/Ch.  Past Medical History:  Diagnosis Date   Anemia    iv iron treatment dec 2014/ see oncology for this last in dec 2014   Anxiety    Arthritis    knee, hands   Asthma    seasonal related/ albuterol  used when uri   CHF (congestive heart failure) (HCC)    CN (constipation) 09/14/2014   DEGENERATIVE DISC DISEASE, LUMBOSACRAL SPINE W/RADICULOPATHY 11/01/2009   Qualifier: Diagnosis of  By: Lelon RIGGERS, Scott     Depression    Diabetes (HCC)    H/O dizziness    Hx   Headache(784.0)    otc med prn   High blood pressure    HYPERLIPIDEMIA 02/13/2010   Qualifier: Diagnosis of  By: Lelon RIGGERS [place]     INSOMNIA 08/24/2008   Qualifier: Diagnosis of  By: Loretha MD, Victoria     Morbid obesity (HCC) 05/24/2008   Qualifier: Diagnosis of  By: Loretha MD, Victoria     SVD (spontaneous vaginal delivery)    x 3    Current Outpatient Medications:    ACCU-CHEK FASTCLIX LANCETS MISC, , Disp: , Rfl: 11   ACCU-CHEK GUIDE TEST test strip, USE 1 STRIP TO CHECK GLUCOSE TWICE DAILY, Disp: 100 each, Rfl: 0   albuterol  (PROVENTIL ) (2.5 MG/3ML) 0.083% nebulizer solution, USE 1 VIAL IN NEBULIZER EVERY 6 HOURS AS NEEDED FOR WHEEZING OR SHORTNESS OF BREATH, Disp: 150 mL, Rfl: 1   allopurinol  (ZYLOPRIM ) 100 MG tablet, Take 1 tablet (100 mg total) by mouth daily., Disp: 90 tablet, Rfl: 3   blood glucose meter kit and supplies KIT, Dispense  based on patient and insurance preference. Use up to four times daily as directed., Disp: 1 each, Rfl: 0   Blood Glucose Monitoring Suppl DEVI, 1 each by Does not apply route 2 (two) times daily. May substitute to any manufacturer covered by patient's insurance., Disp: 1 each, Rfl: 0   cariprazine (VRAYLAR) 3 MG capsule, Take 3 mg by mouth daily., Disp: , Rfl:    cetirizine  (ZYRTEC ) 10 MG tablet, Take 1 tablet (10 mg total) by mouth daily., Disp: 30 tablet, Rfl: 11   cyclobenzaprine  (FLEXERIL ) 10 MG tablet, Take 10 mg by mouth 2 (two) times daily as needed for muscle spasms., Disp: , Rfl:    DULoxetine  (CYMBALTA ) 60 MG capsule, Take 1 capsule (60 mg total) by mouth daily., Disp: 90 capsule, Rfl: 3   ENTRESTO  49-51 MG, Take 1 tablet by mouth 2 (two) times daily., Disp: 180 tablet, Rfl: 1   EQ ALLERGY RELIEF, CETIRIZINE , 10 MG tablet, Take 1 tablet by mouth once daily, Disp: 90 tablet, Rfl: 0   ibuprofen  (ADVIL ) 800 MG tablet, Take 800 mg by mouth every 8 (eight) hours as needed for moderate pain., Disp: , Rfl:    LINZESS  290 MCG CAPS capsule, TAKE 1 CAPSULE  BY MOUTH ONCE DAILY BEFORE BREAKFAST, Disp: 30 capsule, Rfl: 0   metolazone  (ZAROXOLYN ) 2.5 MG tablet, Take 1 tablet (2.5 mg total) by mouth once a week., Disp: 12 tablet, Rfl: 3   naloxone  (NARCAN ) nasal spray 4 mg/0.1 mL, Place 1 spray into the nose as needed., Disp: , Rfl:    oxyCODONE -acetaminophen  (PERCOCET) 10-325 MG tablet, Take 1 tablet by mouth every 4 (four) hours as needed for pain., Disp: , Rfl:    potassium chloride  SA (KLOR-CON  M) 20 MEQ tablet, Take 1 tablet (20 mEq total) by mouth daily. TAKE AN EXTRA (2) TABLETS ON DAYS THAT YOU TAKE METOLAZONE  2.5MG , Disp: 30 tablet, Rfl: 11   Semaglutide , 2 MG/DOSE, (OZEMPIC , 2 MG/DOSE,) 8 MG/3ML SOPN, Inject 2 mg into the skin once a week., Disp: 9 mL, Rfl: 3   spironolactone  (ALDACTONE ) 25 MG tablet, Take 1 tablet (25 mg total) by mouth at bedtime., Disp: , Rfl:    torsemide  (DEMADEX ) 20  MG tablet, Take 100 mg (5 tabs) in AM and 80 mg (4 tabs) in PM, Disp: 810 tablet, Rfl: 2   triamcinolone  cream (KENALOG ) 0.1 %, Apply 1 Application topically 2 (two) times daily., Disp: 30 g, Rfl: 0   ZTLIDO  1.8 % [facility], Apply 1 patch topically as needed (back pain)., Disp: , Rfl:   Social History   Tobacco Use  Smoking Status Former   Current packs/day: 0.00   Types: Cigarettes   Quit date: 04/24/2020   Years since quitting: 4.0  Smokeless Tobacco Never  Tobacco Comments   4-5 cigarettes a day    No Known Allergies Objective:  There were no vitals filed for this visit. There is no height or weight on file to calculate BMI. Constitutional Well developed. Well nourished.  Vascular Dorsalis pedis pulses palpable bilaterally. Posterior tibial pulses palpable bilaterally. Capillary refill normal to all digits.  No cyanosis or clubbing noted. Pedal hair growth normal.  Neurologic Normal speech. Oriented to person, place, and time. Epicritic sensation to light touch grossly present bilaterally.  Dermatologic Pain on palpation of the entire/total nail on 1st digit of the right No other open wounds. No skin lesions.  Orthopedic: Normal joint ROM without pain or crepitus bilaterally. No visible deformities. No bony tenderness.   Radiographs: None Assessment:   1. Ingrown toenail of right foot    Plan:  Patient was evaluated and treated and all questions answered.  Nail contusion/dystrophy hallux with underlying ingrown, right -Patient elects to proceed with minor surgery to remove entire toenail today. Consent reviewed and signed by patient. -Entire/total nail excised. See procedure note. -Educated on post-procedure care including soaking. Written instructions provided and reviewed. -Patient to follow up in 2 weeks for nail check.  Procedure: Excision of entire/total nail with phenol matricectomy Location: Right 1st toe digit Anesthesia: Lidocaine  1% plain; 1.5 mL and  Marcaine  0.5% plain; 1.5 mL, digital block. Skin Prep: Betadine. Dressing: Silvadene; telfa; dry, sterile, compression dressing. Technique: Following skin prep, the toe was exsanguinated and a tourniquet was secured at the base of the toe. The affected nail border was freed and excised.  Phenol was applied in standard technique no complication noted the tourniquet was then removed and sterile dressing applied. Disposition: Patient tolerated procedure well. Patient to return in 2 weeks for follow-up.   No follow-ups on file.

## 2024-05-18 ENCOUNTER — Telehealth (HOSPITAL_COMMUNITY): Payer: Self-pay

## 2024-05-18 MED ORDER — ENTRESTO 49-51 MG PO TABS: 1.0000 | ORAL_TABLET | Freq: Two times a day (BID) | ORAL | 1 refills | Status: AC

## 2024-05-18 NOTE — Telephone Encounter (Signed)
-----   Message from Jordan Lee sent at 05/14/2024 12:18 PM EST ----- Regarding: Entresto  Refill  ----- Message ----- From: Brinda Lorain SQUIBB, [facility] Sent: 05/14/2024  11:33 AM EST To: Jordan Lee, NP  Erskin Jordan - thank you for seeing Ms. Robins the other day. Would you be able to send a refill for a 90 day supply of her Entresto ? It looks like she does not have any refills remaining. Thank you!  Lorain Brinda, PharmD [facility] [phone]

## 2024-05-25 ENCOUNTER — Ambulatory Visit (HOSPITAL_COMMUNITY): Payer: MEDICAID

## 2024-05-25 ENCOUNTER — Ambulatory Visit: Payer: MEDICAID | Admitting: Podiatry

## 2024-05-26 ENCOUNTER — Ambulatory Visit (HOSPITAL_COMMUNITY): Admission: RE | Admit: 2024-05-26 | Discharge: 2024-05-26 | Payer: MEDICAID | Attending: Cardiology

## 2024-05-26 DIAGNOSIS — I5032 Chronic diastolic (congestive) heart failure: Secondary | ICD-10-CM

## 2024-05-26 LAB — BASIC METABOLIC PANEL WITH GFR
Anion gap: 12 (ref 5–15)
BUN: 45 mg/dL — ABNORMAL HIGH (ref 6–20)
CO2: 31 mmol/L (ref 22–32)
Calcium: 9.1 mg/dL (ref 8.9–10.3)
Chloride: 91 mmol/L — ABNORMAL LOW (ref 98–111)
Creatinine, Ser: 1.88 mg/dL — ABNORMAL HIGH (ref 0.44–1.00)
GFR, Estimated: 31 mL/min — ABNORMAL LOW (ref >60)
Glucose, Bld: 88 mg/dL (ref 70–99)
Potassium: 3.5 mmol/L (ref 3.5–5.1)
Sodium: 134 mmol/L — ABNORMAL LOW (ref 135–145)

## 2024-05-26 LAB — BRAIN NATRIURETIC PEPTIDE: B Natriuretic Peptide: 59.9 pg/mL (ref 0.0–100.0)

## 2024-05-27 MED ORDER — TORSEMIDE 20 MG PO TABS: 60.0000 mg | ORAL_TABLET | Freq: Every day | ORAL | 5 refills | Status: DC

## 2024-05-28 ENCOUNTER — Ambulatory Visit: Payer: MEDICAID | Admitting: Podiatry

## 2024-05-28 DIAGNOSIS — L97511 Non-pressure chronic ulcer of other part of right foot limited to breakdown of skin: Secondary | ICD-10-CM | POA: Diagnosis not present

## 2024-05-28 NOTE — Progress Notes (Unsigned)
 Subjective:  Patient ID: Cynthia Becker, female    DOB: 05/21/1971,  MRN: [id number]  Chief Complaint  Patient presents with   Ingrown Toenail    Nail check from ingrown procedure     53 y.o. female presents with the above complaint.  Patient presents with right great toe superficial ulceration patient has a history of ingrown removed by me.  Due to diabetes patient is slow healing the ulcer.  Just want to get it evaluated denies any other acute complaints.  No clinical signs of infection   Review of Systems: Negative except as noted in the HPI. Denies N/V/F/Ch.  Past Medical History:  Diagnosis Date   Anemia    iv iron treatment dec 2014/ see oncology for this last in dec 2014   Anxiety    Arthritis    knee, hands   Asthma    seasonal related/ albuterol  used when uri   CHF (congestive heart failure) (HCC)    CN (constipation) 09/14/2014   DEGENERATIVE DISC DISEASE, LUMBOSACRAL SPINE W/RADICULOPATHY 11/01/2009   Qualifier: Diagnosis of  By: Lelon RIGGERS, Scott     Depression    Diabetes (HCC)    H/O dizziness    Hx   Headache(784.0)    otc med prn   High blood pressure    HYPERLIPIDEMIA 02/13/2010   Qualifier: Diagnosis of  By: Lelon RIGGERS [place]     INSOMNIA 08/24/2008   Qualifier: Diagnosis of  By: Loretha MD, Victoria     Morbid obesity (HCC) 05/24/2008   Qualifier: Diagnosis of  By: Loretha MD, Victoria     SVD (spontaneous vaginal delivery)    x 3   Current Medications[1]  Tobacco Use History[2]  Allergies[3] Objective:  There were no vitals filed for this visit. There is no height or weight on file to calculate BMI. Constitutional Well developed. Well nourished.  Vascular Dorsalis pedis pulses palpable bilaterally. Posterior tibial pulses palpable bilaterally. Capillary refill normal to all digits.  No cyanosis or clubbing noted. Pedal hair growth normal.  Neurologic Normal speech. Oriented to person, place, and time. Epicritic sensation to light touch  grossly present bilaterally.  Dermatologic Right hallux superficial ulceration limited to the breakdown of the skin does not probe down to deep tissue.  No bone exposure noted.  No erythema or purulent drainage noted  Orthopedic: Normal joint ROM without pain or crepitus bilaterally. No visible deformities. No bony tenderness.   Radiographs: None Assessment:   1. Toe ulcer, right, limited to breakdown of skin [facility])    Plan:  Patient was evaluated and treated and all questions answered.  Right hallux superficial ulceration limited to the breakdown of the skin - All questions and concerns were discussed with the patient in extensive detail given the amount of ulceration is present should benefit from Betadine wet-to-dry dressing Betadine dressing was applied. - Given her diabetes patient is at high risk for undergoing amputation she states understanding.  No follow-ups on file.    [1]  Current Outpatient Medications:    ACCU-CHEK FASTCLIX LANCETS MISC, , Disp: , Rfl: 11   ACCU-CHEK GUIDE TEST test strip, USE 1 STRIP TO CHECK GLUCOSE TWICE DAILY, Disp: 100 each, Rfl: 0   albuterol  (PROVENTIL ) (2.5 MG/3ML) 0.083% nebulizer solution, USE 1 VIAL IN NEBULIZER EVERY 6 HOURS AS NEEDED FOR WHEEZING OR SHORTNESS OF BREATH, Disp: 150 mL, Rfl: 1   allopurinol  (ZYLOPRIM ) 100 MG tablet, Take 1 tablet (100 mg total) by mouth daily., Disp: 90 tablet, Rfl: 3  blood glucose meter kit and supplies KIT, Dispense based on patient and insurance preference. Use up to four times daily as directed., Disp: 1 each, Rfl: 0   Blood Glucose Monitoring Suppl DEVI, 1 each by Does not apply route 2 (two) times daily. May substitute to any manufacturer covered by patient's insurance., Disp: 1 each, Rfl: 0   cariprazine (VRAYLAR) 3 MG capsule, Take 3 mg by mouth daily., Disp: , Rfl:    cetirizine  (ZYRTEC ) 10 MG tablet, Take 1 tablet (10 mg total) by mouth daily., Disp: 30 tablet, Rfl: 11   cyclobenzaprine  (FLEXERIL ) 10  MG tablet, Take 10 mg by mouth 2 (two) times daily as needed for muscle spasms., Disp: , Rfl:    DULoxetine  (CYMBALTA ) 60 MG capsule, Take 1 capsule (60 mg total) by mouth daily., Disp: 90 capsule, Rfl: 3   ENTRESTO  49-51 MG, Take 1 tablet by mouth 2 (two) times daily., Disp: 180 tablet, Rfl: 1   EQ ALLERGY RELIEF, CETIRIZINE , 10 MG tablet, Take 1 tablet by mouth once daily, Disp: 90 tablet, Rfl: 0   ibuprofen  (ADVIL ) 800 MG tablet, Take 800 mg by mouth every 8 (eight) hours as needed for moderate pain., Disp: , Rfl:    LINZESS  290 MCG CAPS capsule, TAKE 1 CAPSULE BY MOUTH ONCE DAILY BEFORE BREAKFAST, Disp: 30 capsule, Rfl: 0   metolazone  (ZAROXOLYN ) 2.5 MG tablet, Take 1 tablet (2.5 mg total) by mouth once a week., Disp: 12 tablet, Rfl: 3   naloxone  (NARCAN ) nasal spray 4 mg/0.1 mL, Place 1 spray into the nose as needed., Disp: , Rfl:    oxyCODONE -acetaminophen  (PERCOCET) 10-325 MG tablet, Take 1 tablet by mouth every 4 (four) hours as needed for pain., Disp: , Rfl:    potassium chloride  SA (KLOR-CON  M) 20 MEQ tablet, Take 1 tablet (20 mEq total) by mouth daily. TAKE AN EXTRA (2) TABLETS ON DAYS THAT YOU TAKE METOLAZONE  2.5MG , Disp: 30 tablet, Rfl: 11   Semaglutide , 2 MG/DOSE, (OZEMPIC , 2 MG/DOSE,) 8 MG/3ML SOPN, Inject 2 mg into the skin once a week., Disp: 9 mL, Rfl: 3   spironolactone  (ALDACTONE ) 25 MG tablet, Take 1 tablet (25 mg total) by mouth at bedtime., Disp: , Rfl:    torsemide  (DEMADEX ) 20 MG tablet, Take 3 tablets (60 mg total) by mouth daily., Disp: 90 tablet, Rfl: 5   triamcinolone  cream (KENALOG ) 0.1 %, Apply 1 Application topically 2 (two) times daily., Disp: 30 g, Rfl: 0   ZTLIDO  1.8 % PTCH, Apply 1 patch topically as needed (back pain)., Disp: , Rfl:  [2]  Social History Tobacco Use  Smoking Status Former   Current packs/day: 0.00   Average packs/day: 0.3 packs/day   Types: Cigarettes   Quit date: 04/24/2020   Years since quitting: 4.1  Smokeless Tobacco Never   Tobacco Comments   4-5 cigarettes a day  [3] No Known Allergies

## 2024-06-23 ENCOUNTER — Ambulatory Visit: Payer: MEDICAID | Admitting: Podiatry

## 2024-06-29 ENCOUNTER — Ambulatory Visit: Payer: MEDICAID | Admitting: Podiatry

## 2024-07-02 ENCOUNTER — Other Ambulatory Visit: Payer: MEDICAID

## 2024-07-02 DIAGNOSIS — E0843 Diabetes mellitus due to underlying condition with diabetic autonomic (poly)neuropathy: Secondary | ICD-10-CM

## 2024-07-02 NOTE — Progress Notes (Signed)
 "  07/02/2024 Name: Cynthia Becker MRN: [id number] DOB: 02/07/1971  Chief Complaint  Patient presents with   Diabetes    Cynthia Becker is a 54 y.o. year old female who presented for a telephone visit.   They were referred to the pharmacist by their PCP for assistance in managing diabetes, hypertension, and hyperlipidemia. [name] PMH includes HTN, HFpEF, asthma, OSA, T2DM, HLD, BMI > 60, chronic respiratory failure on O2.    Subjective: Patient was last seen by PCP, Cynthia Borer, NP, on 05/20/24. A1C was 5.6%. She saw HF clinic (Jordan Lee, NP) on 05/11/24. She was instructed to increase torsemide  to 100 mg in the am (5 tabs) 80 mg in the pm (4 tabs) and take potassium 20 mEq daily. Continued metolazone  + 40 meQ K once per week. She was having some dizziness and was advised to switch spironolactone  25 mg to the evening. Orthostatic vitals were WNL. Planning possible Cardiomems and RHC in the future. BNP was WNL and BMP was stable. She was advised to return for repeat labs in 2 weeks after increasing torsemide . At pharmacy call on 05/14/24, patient reported fluid status was improved with higher dose of torsemide . She requested a refill of allpurinol and higher dose of duloxetine  for nerve pain.   Today, patient reports fluid status is worse again. Notes low appetite, feeling like she is holding onto fluid in her abdomen and her face. Feel nauseous and taking promethazine  daily. She also says she has white patches on her mouth and is concerned about oral thrush. She is planning to call AHF clinic to be reevaluated from a fluid standpoint.   Care Team: Primary Care Provider: Borer Cynthia RAMAN, NP ; Next Scheduled Visit: 07/21/24  Medication Access/Adherence  Current Pharmacy:  [facility] [address], [place] - [address] [place] [address] Phone: [phone] Fax: [phone]   Patient reports affordability concerns with their medications: No  Patient  reports access/transportation concerns to their pharmacy: Yes  Patient reports adherence concerns with their medications:  No    Diabetes:  Current medications: Ozempic  2 mg weekly  Continues to tolerate Ozmepic well - needs to lose 90 lbs to be considered for weight loss surgery. Reports she has been more nauseated the past few weeks but feels it is likely due to fluid. Taking promethazine  for nausea most days.   Current glucose readings:  Using glucometer; testing once daily -  FBG continue to be controlled  Patient denies hypoglycemic s/sx including dizziness, shakiness, sweating. Patient denies hyperglycemic symptoms including polyuria, polydipsia, polyphagia, nocturia, neuropathy, blurred vision.  Current meal patterns: Usually does not get hungry until ~2PM. Feels like she has been struggling with eating more carbohydrates recently.  - Breakfast: skips usually - Lunch: eats salad with turkey or grilled chicken - Supper: Likes cabbage, green beans, zucchini, greens for vegetables. Eats meat usually once/day at dinner. Sometimes rice. - Drinks: currently having about 30 oz of apple juice and 30 oz of water per day, has strictly followed 64 oz fluid restriction  Current physical activity: limited by mobility (wheelchair bound). She is getting injections for her knees- so this helps her pain, and hopes she will be able to be more active.  Current medication access support: Medicaid insurance  Heart Failure (EF 60-65% in 2023):  Current medications:  ACEi/ARB/ARNI: Entresto  49-51 mg BID - discrepancy in fill hx, but patient states she continues to take BID. Refills at pharmacy. SGLT2i: none d/t concern for  GU AE Beta blocker: none Mineralocorticoid Receptor Antagonist: spironolactone  25 mg daily Diuretic regimen: torsemide  60 mg BID,  metolazone  2.5 mg once weekly (Monday) - she does occasionally skip doses of diuretics due to needing to go to appts or run errands.  Potassium: 20 mEq  once daily (except 40 mEq with metolazone )   Reports she feels fluid overload in the abdomen, face. Had ShOB last week, but better at the moment. Scale only says she has gained 2lbs, but she feels like it is more. Appetite lower, endorses feeling of fullness. Endorses following 64 oz fluid restriction.   Objective:  BP Readings from Last 3 Encounters:  05/11/24 116/72  04/19/24 117/72  01/07/24 101/73    Lab Results  Component Value Date   HGBA1C 5.6 04/19/2024   HGBA1C 5.8 (A) 10/07/2023   HGBA1C 5.8 05/19/2023       Latest Ref Rng & Units 05/26/2024    3:48 PM 05/11/2024   12:16 PM 04/28/2024    3:52 PM  BMP  Glucose 70 - 99 mg/dL 88  93  89   BUN 6 - 20 mg/dL 45  27  22   Creatinine 0.44 - 1.00 mg/dL 8.11  8.55  8.59   Sodium 135 - 145 mmol/L 134  134  134   Potassium 3.5 - 5.1 mmol/L 3.5  3.6  3.9   Chloride 98 - 111 mmol/L 91  92  93   CO2 22 - 32 mmol/L 31  24  26    Calcium  8.9 - 10.3 mg/dL 9.1  9.4  9.0     Lab Results  Component Value Date   CHOL 216 (H) 04/19/2024   HDL 49 04/19/2024   LDLCALC 151 (H) 04/19/2024   TRIG 88 04/19/2024   CHOLHDL 4.4 04/19/2024    Medications Reviewed Today     Reviewed by Brinda Lorain SQUIBB, RPH-CPP (Pharmacist) on 07/02/24 at 1349  Med List Status: <None>   Medication Order Taking? Sig Documenting Provider Last Dose Status Informant  ACCU-CHEK FASTCLIX LANCETS MISC [id number]   [provider]  Active Spouse/Significant Other  ACCU-CHEK GUIDE TEST test strip [id number]  USE 1 STRIP TO CHECK GLUCOSE TWICE DAILY [name], [name], NP  Active   albuterol  (PROVENTIL ) (2.5 MG/3ML) 0.083% nebulizer solution [id number]  USE 1 VIAL IN NEBULIZER EVERY 6 HOURS AS NEEDED FOR WHEEZING OR SHORTNESS OF BREATH [place], [name], NP  Active   allopurinol  (ZYLOPRIM ) 100 MG tablet [id number] Yes Take 1 tablet (100 mg total) by mouth daily. Oley Cynthia RAMAN, NP  Active   blood glucose meter kit and supplies KIT [id number]  Dispense based on  patient and insurance preference. Use up to four times daily as directed. Oley Cynthia RAMAN, NP  Active   Blood Glucose Monitoring Suppl DEVI [id number]  1 each by Does not apply route 2 (two) times daily. May substitute to any manufacturer covered by patient's insurance. Oley Cynthia RAMAN, NP  Active   cariprazine (VRAYLAR) 3 MG capsule [id number]  Take 3 mg by mouth daily. [provider]  Active   cetirizine  (ZYRTEC ) 10 MG tablet [id number]  Take 1 tablet (10 mg total) by mouth daily. Oley Cynthia RAMAN, NP  Active   cyclobenzaprine  (FLEXERIL ) 10 MG tablet [id number]  Take 10 mg by mouth 2 (two) times daily as needed for muscle spasms. [provider]  Active   DULoxetine  (CYMBALTA ) 60 MG capsule [id number] Yes Take 1 capsule (60 mg total) by mouth daily. Oley Cynthia RAMAN,  NP  Active   ENTRESTO  49-51 MG [id number] Yes Take 1 tablet by mouth 2 (two) times daily. Lee, Jordan, NP  Active   EQ ALLERGY RELIEF, CETIRIZINE , 10 MG tablet [id number]  Take 1 tablet by mouth once daily [name], [name], NP  Active   ibuprofen  (ADVIL ) 800 MG tablet [id number]  Take 800 mg by mouth every 8 (eight) hours as needed for moderate pain. [provider]  Active Spouse/Significant Other           Med Note MAYER SPEAKS R   Tue Nov 11, 2023  9:22 AM) Taking PRN  LINZESS  290 MCG CAPS capsule [id number]  TAKE 1 CAPSULE BY MOUTH ONCE DAILY BEFORE BREAKFAST [name], [name], NP  Active   metolazone  (ZAROXOLYN ) 2.5 MG tablet [id number] Yes Take 1 tablet (2.5 mg total) by mouth once a week. [place], [place], [place]  Active   naloxone  (NARCAN ) nasal spray 4 mg/0.1 mL [id number]  Place 1 spray into the nose as needed. [provider]  Active Spouse/Significant Other  oxyCODONE -acetaminophen  (PERCOCET) 10-325 MG tablet [id number]  Take 1 tablet by mouth every 4 (four) hours as needed for pain. [provider]  Active   potassium chloride  SA (KLOR-CON  M) 20 MEQ tablet [id number] Yes Take 1 tablet (20  mEq total) by mouth daily. TAKE AN EXTRA (2) TABLETS ON DAYS THAT YOU TAKE METOLAZONE  2.5MG  Marcine Caffie HERO, PA-C  Active   Semaglutide , 2 MG/DOSE, (OZEMPIC , 2 MG/DOSE,) 8 MG/3ML SOPN [id number] Yes Inject 2 mg into the skin once a week. Oley Cynthia RAMAN, NP  Active   spironolactone  (ALDACTONE ) 25 MG tablet [id number] Yes Take 1 tablet (25 mg total) by mouth at bedtime. Lee, Jordan, NP  Active   torsemide  (DEMADEX ) 20 MG tablet [id number] Yes Take 3 tablets (60 mg total) by mouth daily. Lee, Jordan, NP  Active   triamcinolone  cream (KENALOG ) 0.1 % [id number]  Apply 1 Application topically 2 (two) times daily. Oley Cynthia RAMAN, NP  Active   ZTLIDO  1.8 % [facility] [id number]  Apply 1 patch topically as needed (back pain). [provider]  Active               Assessment/Plan:   Diabetes: - Currently controlled with most recent A1C of 5.6% below goal <7%. Medication adherence appears appropriate. She desires continued treatment with GLP-1RA for weight loss. She is tolerating Ozempic  2 mg well. Her sleep study did not indicate severe enough OSA to pursue coverage of Zepbound.  - Last UACR: due at next PCP appointment - Patient denies personal or family history of multiple endocrine neoplasia type 2, medullary thyroid cancer; personal history of pancreatitis or gallbladder disease. - Reviewed long term cardiovascular and renal outcomes of uncontrolled blood sugar - Reviewed goal A1c, goal fasting, and goal 2 hour post prandial glucose - Reviewed lifestyle modifications including: aiming for 150 minutes of moderate intensity exercise every week.  - Recommend to continue Ozempic  2 mg weekly. - Recommend to check glucose once daily fasting. Counseled patient to bring glucometer or BG log to every appointment. - Next A1C due April 2026   Heart Failure: - Currently appropriately managed. Difficult to control fluid status. Managed by AHF clinic. Question consistent BID adherence to  Entresto  given fill hx. Encouraged to outreach clinic given weight gain and symptoms of luid overload.  - Reviewed appropriate blood pressure monitoring technique and reviewed goal blood pressure - Reviewed to weigh daily and when to contact cardiology with weight gain -  Recommend to continue current regimen:   ACEi/ARB/ARNI: Entresto  49-51 mg BID  SGLT2i: none d/t concern for GU AE/hygiene Beta blocker: none Mineralocorticoid Receptor Antagonist: spironolactone  25 mg daily Diuretic regimen: torsemide  60 mg BID, metolazone  2.5 mg once weekly (Mondays)  Potassium: 20 mEq once daily (except 40 mEq with metolazone )   Follow Up Plan:  Pharmacist telephone 04/16/24 PCP clinic visit 04/08/24   Lorain Baseman, PharmD [facility] [phone]  "

## 2024-07-07 ENCOUNTER — Ambulatory Visit: Payer: MEDICAID | Admitting: Podiatry

## 2024-07-07 DIAGNOSIS — L97511 Non-pressure chronic ulcer of other part of right foot limited to breakdown of skin: Secondary | ICD-10-CM | POA: Diagnosis not present

## 2024-07-07 NOTE — Progress Notes (Signed)
 "  Subjective:  Patient ID: Cynthia Becker, female    DOB: December 31, 1970,  MRN: [id number]  Chief Complaint  Patient presents with   Foot Ulcer    54 y.o. female presents with the above complaint.  Patient presents with right great toe superficial ulceration patient has a history of ingrown removed by me.  Due to diabetes patient is slow healing the ulcer.  Just want to get it evaluated denies any other acute complaints.  No clinical signs of infection   Review of Systems: Negative except as noted in the HPI. Denies N/V/F/Ch.  Past Medical History:  Diagnosis Date   Anemia    iv iron treatment dec 2014/ see oncology for this last in dec 2014   Anxiety    Arthritis    knee, hands   Asthma    seasonal related/ albuterol  used when uri   CHF (congestive heart failure) (HCC)    CN (constipation) 09/14/2014   DEGENERATIVE DISC DISEASE, LUMBOSACRAL SPINE W/RADICULOPATHY 11/01/2009   Qualifier: Diagnosis of  By: Lelon RIGGERS, Scott     Depression    Diabetes (HCC)    H/O dizziness    Hx   Headache(784.0)    otc med prn   High blood pressure    HYPERLIPIDEMIA 02/13/2010   Qualifier: Diagnosis of  By: Lelon RIGGERS [place]     INSOMNIA 08/24/2008   Qualifier: Diagnosis of  By: Loretha MD, Victoria     Morbid obesity (HCC) 05/24/2008   Qualifier: Diagnosis of  By: Loretha MD, Victoria     SVD (spontaneous vaginal delivery)    x 3   Current Medications[1]  Tobacco Use History[2]  Allergies[3] Objective:  There were no vitals filed for this visit. There is no height or weight on file to calculate BMI. Constitutional Well developed. Well nourished.  Vascular Dorsalis pedis pulses palpable bilaterally. Posterior tibial pulses palpable bilaterally. Capillary refill normal to all digits.  No cyanosis or clubbing noted. Pedal hair growth normal.  Neurologic Normal speech. Oriented to person, place, and time. Epicritic sensation to light touch grossly present bilaterally.  Dermatologic  Right hallux superficial ulceration limited to the breakdown of the skin does not probe down to deep tissue.  No bone exposure noted.  No erythema or purulent drainage noted  Orthopedic: Normal joint ROM without pain or crepitus bilaterally. No visible deformities. No bony tenderness.   Radiographs: None Assessment:   No diagnosis found.  Plan:  Patient was evaluated and treated and all questions answered.  Right hallux superficial ulceration limited to the breakdown of the skin~slow healing - All questions and concerns were discussed with the patient in  Continue Betadine wet-to-dry dressing change daily for next 3 weeks - I encouraged him to keep it nice and dry they state understanding.- - Given her diabetes patient is at high risk for undergoing amputation she states understanding.  No follow-ups on file.     [1]  Current Outpatient Medications:    ACCU-CHEK FASTCLIX LANCETS MISC, , Disp: , Rfl: 11   ACCU-CHEK GUIDE TEST test strip, USE 1 STRIP TO CHECK GLUCOSE TWICE DAILY, Disp: 100 each, Rfl: 0   albuterol  (PROVENTIL ) (2.5 MG/3ML) 0.083% nebulizer solution, USE 1 VIAL IN NEBULIZER EVERY 6 HOURS AS NEEDED FOR WHEEZING OR SHORTNESS OF BREATH, Disp: 150 mL, Rfl: 1   allopurinol  (ZYLOPRIM ) 100 MG tablet, Take 1 tablet (100 mg total) by mouth daily., Disp: 90 tablet, Rfl: 3   blood glucose meter kit and supplies KIT, Dispense based  on patient and insurance preference. Use up to four times daily as directed., Disp: 1 each, Rfl: 0   Blood Glucose Monitoring Suppl DEVI, 1 each by Does not apply route 2 (two) times daily. May substitute to any manufacturer covered by patient's insurance., Disp: 1 each, Rfl: 0   cariprazine (VRAYLAR) 3 MG capsule, Take 3 mg by mouth daily., Disp: , Rfl:    cetirizine  (ZYRTEC ) 10 MG tablet, Take 1 tablet (10 mg total) by mouth daily., Disp: 30 tablet, Rfl: 11   cyclobenzaprine  (FLEXERIL ) 10 MG tablet, Take 10 mg by mouth 2 (two) times daily as needed for  muscle spasms., Disp: , Rfl:    DULoxetine  (CYMBALTA ) 60 MG capsule, Take 1 capsule (60 mg total) by mouth daily., Disp: 90 capsule, Rfl: 3   ENTRESTO  49-51 MG, Take 1 tablet by mouth 2 (two) times daily., Disp: 180 tablet, Rfl: 1   EQ ALLERGY RELIEF, CETIRIZINE , 10 MG tablet, Take 1 tablet by mouth once daily, Disp: 90 tablet, Rfl: 0   ibuprofen  (ADVIL ) 800 MG tablet, Take 800 mg by mouth every 8 (eight) hours as needed for moderate pain., Disp: , Rfl:    LINZESS  290 MCG CAPS capsule, TAKE 1 CAPSULE BY MOUTH ONCE DAILY BEFORE BREAKFAST, Disp: 30 capsule, Rfl: 0   metolazone  (ZAROXOLYN ) 2.5 MG tablet, Take 1 tablet (2.5 mg total) by mouth once a week., Disp: 12 tablet, Rfl: 3   naloxone  (NARCAN ) nasal spray 4 mg/0.1 mL, Place 1 spray into the nose as needed., Disp: , Rfl:    oxyCODONE -acetaminophen  (PERCOCET) 10-325 MG tablet, Take 1 tablet by mouth every 4 (four) hours as needed for pain., Disp: , Rfl:    potassium chloride  SA (KLOR-CON  M) 20 MEQ tablet, Take 1 tablet (20 mEq total) by mouth daily. TAKE AN EXTRA (2) TABLETS ON DAYS THAT YOU TAKE METOLAZONE  2.5MG , Disp: 30 tablet, Rfl: 11   Semaglutide , 2 MG/DOSE, (OZEMPIC , 2 MG/DOSE,) 8 MG/3ML SOPN, Inject 2 mg into the skin once a week., Disp: 9 mL, Rfl: 3   spironolactone  (ALDACTONE ) 25 MG tablet, Take 1 tablet (25 mg total) by mouth at bedtime., Disp: , Rfl:    torsemide  (DEMADEX ) 20 MG tablet, Take 3 tablets (60 mg total) by mouth daily., Disp: 90 tablet, Rfl: 5   triamcinolone  cream (KENALOG ) 0.1 %, Apply 1 Application topically 2 (two) times daily., Disp: 30 g, Rfl: 0   ZTLIDO  1.8 % PTCH, Apply 1 patch topically as needed (back pain)., Disp: , Rfl:  [2]  Social History Tobacco Use  Smoking Status Former   Current packs/day: 0.00   Average packs/day: 0.3 packs/day   Types: Cigarettes   Quit date: 04/24/2020   Years since quitting: 4.2  Smokeless Tobacco Never  Tobacco Comments   4-5 cigarettes a day  [3] No Known Allergies  "

## 2024-07-13 ENCOUNTER — Other Ambulatory Visit (HOSPITAL_COMMUNITY): Payer: Self-pay

## 2024-07-13 DIAGNOSIS — I5032 Chronic diastolic (congestive) heart failure: Secondary | ICD-10-CM

## 2024-07-13 MED ORDER — SPIRONOLACTONE 25 MG PO TABS: 25.0000 mg | ORAL_TABLET | Freq: Every day | ORAL | 3 refills | Status: AC

## 2024-07-21 ENCOUNTER — Telehealth (HOSPITAL_COMMUNITY): Payer: Self-pay

## 2024-07-21 ENCOUNTER — Encounter: Payer: Self-pay | Admitting: Nurse Practitioner

## 2024-07-21 ENCOUNTER — Ambulatory Visit: Payer: MEDICAID | Admitting: Podiatry

## 2024-07-21 ENCOUNTER — Ambulatory Visit: Payer: Self-pay | Admitting: Nurse Practitioner

## 2024-07-21 VITALS — BP 125/65 | HR 95 | Temp 98.1°F | Wt >= 6400 oz

## 2024-07-21 DIAGNOSIS — E0843 Diabetes mellitus due to underlying condition with diabetic autonomic (poly)neuropathy: Secondary | ICD-10-CM

## 2024-07-21 DIAGNOSIS — M109 Gout, unspecified: Secondary | ICD-10-CM | POA: Diagnosis not present

## 2024-07-21 DIAGNOSIS — J0100 Acute maxillary sinusitis, unspecified: Secondary | ICD-10-CM | POA: Diagnosis not present

## 2024-07-21 DIAGNOSIS — B370 Candidal stomatitis: Secondary | ICD-10-CM | POA: Diagnosis not present

## 2024-07-21 MED ORDER — AMOXICILLIN-POT CLAVULANATE 875-125 MG PO TABS: 1.0000 | ORAL_TABLET | Freq: Two times a day (BID) | ORAL | 0 refills | Status: AC

## 2024-07-21 MED ORDER — NYSTATIN 100000 UNIT/ML MT SUSP: 5.0000 mL | Freq: Four times a day (QID) | OROMUCOSAL | 0 refills | Status: AC

## 2024-07-21 NOTE — Telephone Encounter (Signed)
 Called to confirm/remind patient of their appointment [facility] on 07/21/24.   Appointment:   [x] Confirmed  [] Left mess   [] No answer/No voice mail  [] VM Full/unable to leave message  [] Phone not in service  Patient reminded to bring all medications and/or complete list.  Confirmed patient has transportation. Gave directions, instructed to utilize valet parking.

## 2024-07-21 NOTE — Progress Notes (Signed)
 "  Subjective   Patient ID: Cynthia Becker, female    DOB: 1970/11/19, 54 y.o.   MRN: [id number]  Chief Complaint  Patient presents with   Nasal Congestion    Thinking have sinus infection over x 1 week. Very dehydrated and drinking plenty of water and gatorade not able to get hydrated enough. Greenish mucus.     Referring provider: Oley Bascom RAMAN, NP  Cynthia Becker is a 54 y.o. female with Past Medical History: No date: Anemia     Comment:  iv iron treatment dec 2014/ see oncology for this last               in dec 2014 No date: Anxiety No date: Arthritis     Comment:  knee, hands No date: Asthma     Comment:  seasonal related/ albuterol  used when uri No date: CHF (congestive heart failure) (HCC) 09/14/2014: CN (constipation) 11/01/2009: DEGENERATIVE DISC DISEASE, LUMBOSACRAL SPINE W/ RADICULOPATHY     Comment:  Qualifier: Diagnosis of  By: Lelon RIGGERS, Scott   No date: Depression No date: Diabetes (HCC) No date: H/O dizziness     Comment:  Hx No date: Headache(784.0)     Comment:  otc med prn No date: High blood pressure 02/13/2010: HYPERLIPIDEMIA     Comment:  Qualifier: Diagnosis of  By: Lelon RIGGERS, Scott   08/24/2008: INSOMNIA     Comment:  Qualifier: Diagnosis of  By: Loretha MD, Richerd   05/24/2008: Morbid obesity (HCC)     Comment:  Qualifier: Diagnosis of  By: Loretha MD, Victoria   No date: SVD (spontaneous vaginal delivery)     Comment:  x 3   HPI  Patient presents for follow up after recent pharmacy. Notes from pharmacy visit:   Diabetes: - Currently controlled with most recent A1C of 5.6% below goal <7%. Medication adherence appears appropriate. She desires continued treatment with GLP-1RA for weight loss. She is tolerating Ozempic  2 mg well. Her sleep study did not indicate severe enough OSA to pursue coverage of Zepbound.  - Last UACR: due at next PCP appointment - Patient denies personal or family history of multiple endocrine neoplasia type 2,  medullary thyroid cancer; personal history of pancreatitis or gallbladder disease. - Reviewed long term cardiovascular and renal outcomes of uncontrolled blood sugar - Reviewed goal A1c, goal fasting, and goal 2 hour post prandial glucose - Reviewed lifestyle modifications including: aiming for 150 minutes of moderate intensity exercise every week.  - Recommend to continue Ozempic  2 mg weekly. - Recommend to check glucose once daily fasting. Counseled patient to bring glucometer or BG log to every appointment. - Next A1C due April 2026     Heart Failure: - Currently appropriately managed. Difficult to control fluid status. Managed by AHF clinic. Question consistent BID adherence to Entresto  given fill hx. Encouraged to outreach clinic given weight gain and symptoms of luid overload.  - Reviewed appropriate blood pressure monitoring technique and reviewed goal blood pressure - Reviewed to weigh daily and when to contact cardiology with weight gain - Recommend to continue current regimen:   ACEi/ARB/ARNI: Entresto  49-51 mg BID  SGLT2i: none d/t concern for GU AE/hygiene Beta blocker: none Mineralocorticoid Receptor Antagonist: spironolactone  25 mg daily Diuretic regimen: torsemide  60 mg BID, metolazone  2.5 mg once weekly (Mondays)  Potassium: 20 mEq once daily (except 40 mEq with metolazone )  Note: Patient complains today of sinus congestion pressure and pain with postnasal drip for the past week or 2.  We  will trial Augmentin .  Patient does state that she has thrush.  She has had a film over her tongue and inside of her mouth.  We will trial nystatin  mouthwash.  Allergies[1]  Immunization History  Administered Date(s) Administered   Influenza Whole 04/25/2010   Influenza,inj,Quad PF,6+ Mos 03/07/2014, 03/10/2019   Pneumococcal Polysaccharide-23 04/13/2014   Td 08/02/2010    Tobacco History: Tobacco Use History[2] Counseling given: Not Answered Tobacco comments: 4-5 cigarettes a  day   Outpatient Encounter Medications as of 07/21/2024  Medication Sig   ACCU-CHEK FASTCLIX LANCETS MISC    ACCU-CHEK GUIDE TEST test strip USE 1 STRIP TO CHECK GLUCOSE TWICE DAILY   albuterol  (PROVENTIL ) (2.5 MG/3ML) 0.083% nebulizer solution USE 1 VIAL IN NEBULIZER EVERY 6 HOURS AS NEEDED FOR WHEEZING OR SHORTNESS OF BREATH   allopurinol  (ZYLOPRIM ) 100 MG tablet Take 1 tablet (100 mg total) by mouth daily.   amoxicillin -clavulanate (AUGMENTIN ) 875-125 MG tablet Take 1 tablet by mouth 2 (two) times daily.   blood glucose meter kit and supplies KIT Dispense based on patient and insurance preference. Use up to four times daily as directed.   Blood Glucose Monitoring Suppl DEVI 1 each by Does not apply route 2 (two) times daily. May substitute to any manufacturer covered by patient's insurance.   cariprazine (VRAYLAR) 3 MG capsule Take 3 mg by mouth daily.   cetirizine  (ZYRTEC ) 10 MG tablet Take 1 tablet (10 mg total) by mouth daily.   cyclobenzaprine  (FLEXERIL ) 10 MG tablet Take 10 mg by mouth 2 (two) times daily as needed for muscle spasms.   DULoxetine  (CYMBALTA ) 60 MG capsule Take 1 capsule (60 mg total) by mouth daily.   ENTRESTO  49-51 MG Take 1 tablet by mouth 2 (two) times daily.   EQ ALLERGY RELIEF, CETIRIZINE , 10 MG tablet Take 1 tablet by mouth once daily   ibuprofen  (ADVIL ) 800 MG tablet Take 800 mg by mouth every 8 (eight) hours as needed for moderate pain.   LINZESS  290 MCG CAPS capsule TAKE 1 CAPSULE BY MOUTH ONCE DAILY BEFORE BREAKFAST   metolazone  (ZAROXOLYN ) 2.5 MG tablet Take 1 tablet (2.5 mg total) by mouth once a week.   nystatin  (MYCOSTATIN ) 100000 UNIT/ML suspension Take 5 mLs (500,000 Units total) by mouth 4 (four) times daily.   oxyCODONE -acetaminophen  (PERCOCET) 10-325 MG tablet Take 1 tablet by mouth every 4 (four) hours as needed for pain.   potassium chloride  SA (KLOR-CON  M) 20 MEQ tablet Take 1 tablet (20 mEq total) by mouth daily. TAKE AN EXTRA (2) TABLETS ON  DAYS THAT YOU TAKE METOLAZONE  2.5MG    Semaglutide , 2 MG/DOSE, (OZEMPIC , 2 MG/DOSE,) 8 MG/3ML SOPN Inject 2 mg into the skin once a week.   spironolactone  (ALDACTONE ) 25 MG tablet Take 1 tablet (25 mg total) by mouth at bedtime.   torsemide  (DEMADEX ) 20 MG tablet Take 3 tablets (60 mg total) by mouth daily.   triamcinolone  cream (KENALOG ) 0.1 % Apply 1 Application topically 2 (two) times daily.   ZTLIDO  1.8 % PTCH Apply 1 patch topically as needed (back pain).   naloxone  (NARCAN ) nasal spray 4 mg/0.1 mL Place 1 spray into the nose as needed. (Patient not taking: Reported on 07/21/2024)   No facility-administered encounter medications on file as of 07/21/2024.    Review of Systems  Review of Systems  Constitutional: Negative.   HENT:  Positive for congestion, postnasal drip, sinus pressure and sinus pain.   Cardiovascular: Negative.   Gastrointestinal: Negative.   Allergic/Immunologic: Negative.  Neurological: Negative.   Psychiatric/Behavioral: Negative.       Objective:   BP 125/65   Pulse 95   Temp 98.1 F (36.7 C) (Temporal)   Wt (!) 407 lb (184.6 kg)   LMP 02/13/2014   SpO2 96%   BMI 72.10 kg/m   Wt Readings from Last 5 Encounters:  07/21/24 (!) 407 lb (184.6 kg)  05/11/24 (!) 406 lb 4 oz (184.3 kg)  04/19/24 (!) 417 lb (189.1 kg)  01/07/24 (!) 422 lb 9.6 oz (191.7 kg)  12/22/23 (!) 420 lb (190.5 kg)     Physical Exam Vitals and nursing note reviewed.  Constitutional:      General: She is not in acute distress.    Appearance: She is well-developed.  HENT:     Nose: Congestion present.     Mouth/Throat:     Comments: Thrush noted Cardiovascular:     Rate and Rhythm: Normal rate and regular rhythm.  Pulmonary:     Effort: Pulmonary effort is normal.     Breath sounds: Normal breath sounds.  Neurological:     Mental Status: She is alert and oriented to person, place, and time.       Assessment & Plan:   Diabetes mellitus due to underlying condition  with diabetic autonomic neuropathy, unspecified whether long term insulin  use (HCC) -     Microalbumin / creatinine urine ratio -     Uric acid  Gout, unspecified cause, unspecified chronicity, unspecified site -     Uric acid  Acute non-recurrent maxillary sinusitis -     Amoxicillin -Pot Clavulanate; Take 1 tablet by mouth 2 (two) times daily.  Dispense: 20 tablet; Refill: 0  Thrush -     Nystatin ; Take 5 mLs (500,000 Units total) by mouth 4 (four) times daily.  Dispense: 60 mL; Refill: 0     Return in about 3 months (around 10/19/2024).   Bascom GORMAN Borer, NP 07/21/2024     [1] No Known Allergies [2]  Social History Tobacco Use  Smoking Status Former   Current packs/day: 0.00   Average packs/day: 0.3 packs/day   Types: Cigarettes   Quit date: 04/24/2020   Years since quitting: 4.2  Smokeless Tobacco Never  Tobacco Comments   4-5 cigarettes a day   "

## 2024-07-21 NOTE — Progress Notes (Signed)
 "  ADVANCED HF CLINIC NOTE   PCP: Oley Bascom RAMAN, NP Pulmonology: Dr. Gretta Primary Cardiologist: Dr Elmira  HF Cardiologist: Dr. Cherrie  HPI: Cynthia Becker is a 54 y.o. female w/ a history of chronic diastolic CHF, HTN, morbid obesity, type II DM, HLD tobacco use, asthma, chronic respiratory failure with hypoxia on supplemental O2.    She's had multiple admissions over the last few years with a/c respiratory failure with hypoxia secondary to a/c diastolic CHF.    Echo 4/22: EF 60-65%, RV not well visualized, study technically difficult d/t body habitus.   Admitted 12/22 with a/c respiratory failure with hypoxia secondary to influenza A pneumonia and a/c CHF. Diuresed with IV lasix . Discharged on torsemide  40 mg daily. Course complicated by encephalopathy, felt to be likely d/t CO2 narcosis vs narcotic use.   Follow up 12/22 with PCP, 20 lbs weight gain. Diuretics increased and referred to [facility] for evaluation.  She's had recurrent issues with volume overload in recent months requiring adjustments in diuretics. Last seen 2 months ago. CardioMems discussed.  Here today for CHF visit. Weight at home 412 on 1/27 and 406 yesterday. Hasn't been eating much. She thinks this may be d/t combination of volume overload and her GLP-1 RA. Reports shortness of breath with little exertion, worsening leg edema and abdominal bloating. Doesn't respond as well to diuretics as she previously did.   Cardiac Studies: - Echo (10/23): EF 60-65% RV ok - Echo (4/22): EF 60-65%, RV not well visualized, study technically difficult d/t body habitus. - Echo (11/21): EF 60-65%, moderate LVH, grade II DD, RV normal - Echo (2/21): EF 50-55%, grade II DD, moderate LVH, technically difficult study.  ROS: All systems reviewed and negative except as per HPI.   Past Medical History:  Diagnosis Date   Anemia    iv iron treatment dec 2014/ see oncology for this last in dec 2014   Anxiety     Arthritis    knee, hands   Asthma    seasonal related/ albuterol  used when uri   CHF (congestive heart failure) (HCC)    CN (constipation) 09/14/2014   DEGENERATIVE DISC DISEASE, LUMBOSACRAL SPINE W/RADICULOPATHY 11/01/2009   Qualifier: Diagnosis of  By: Lelon RIGGERS, Scott     Depression    Diabetes (HCC)    H/O dizziness    Hx   Headache(784.0)    otc med prn   High blood pressure    HYPERLIPIDEMIA 02/13/2010   Qualifier: Diagnosis of  By: Lelon RIGGERS [place]     INSOMNIA 08/24/2008   Qualifier: Diagnosis of  By: Loretha MD, Victoria     Morbid obesity (HCC) 05/24/2008   Qualifier: Diagnosis of  By: Loretha MD, Victoria     SVD (spontaneous vaginal delivery)    x 3   Current Outpatient Medications  Medication Sig Dispense Refill   ACCU-CHEK FASTCLIX LANCETS MISC   11   ACCU-CHEK GUIDE TEST test strip USE 1 STRIP TO CHECK GLUCOSE TWICE DAILY 100 each 0   albuterol  (PROVENTIL ) (2.5 MG/3ML) 0.083% nebulizer solution USE 1 VIAL IN NEBULIZER EVERY 6 HOURS AS NEEDED FOR WHEEZING OR SHORTNESS OF BREATH 150 mL 1   amoxicillin -clavulanate (AUGMENTIN ) 875-125 MG tablet Take 1 tablet by mouth 2 (two) times daily. 20 tablet 0   blood glucose meter kit and supplies KIT Dispense based on patient and insurance preference. Use up to four times daily as directed. 1 each 0   Blood Glucose Monitoring Suppl DEVI  1 each by Does not apply route 2 (two) times daily. May substitute to any manufacturer covered by patient's insurance. 1 each 0   cariprazine (VRAYLAR) 3 MG capsule Take 3 mg by mouth daily.     cetirizine  (ZYRTEC ) 10 MG tablet Take 1 tablet (10 mg total) by mouth daily. 30 tablet 11   cyclobenzaprine  (FLEXERIL ) 10 MG tablet Take 10 mg by mouth 2 (two) times daily as needed for muscle spasms.     DULoxetine  (CYMBALTA ) 60 MG capsule Take 1 capsule (60 mg total) by mouth daily. 90 capsule 3   ENTRESTO  49-51 MG Take 1 tablet by mouth 2 (two) times daily. 180 tablet 1   EQ ALLERGY RELIEF,  CETIRIZINE , 10 MG tablet Take 1 tablet by mouth once daily 90 tablet 0   ibuprofen  (ADVIL ) 800 MG tablet Take 800 mg by mouth every 8 (eight) hours as needed for moderate pain.     LINZESS  290 MCG CAPS capsule TAKE 1 CAPSULE BY MOUTH ONCE DAILY BEFORE BREAKFAST 30 capsule 0   metolazone  (ZAROXOLYN ) 2.5 MG tablet Take 1 tablet (2.5 mg total) by mouth once a week. 12 tablet 3   nystatin  (MYCOSTATIN ) 100000 UNIT/ML suspension Take 5 mLs (500,000 Units total) by mouth 4 (four) times daily. 60 mL 0   oxyCODONE -acetaminophen  (PERCOCET) 10-325 MG tablet Take 1 tablet by mouth every 4 (four) hours as needed for pain.     potassium chloride  SA (KLOR-CON  M) 20 MEQ tablet Take 1 tablet (20 mEq total) by mouth daily. TAKE AN EXTRA (2) TABLETS ON DAYS THAT YOU TAKE METOLAZONE  2.5MG  30 tablet 11   Semaglutide , 2 MG/DOSE, (OZEMPIC , 2 MG/DOSE,) 8 MG/3ML SOPN Inject 2 mg into the skin once a week. 9 mL 3   spironolactone  (ALDACTONE ) 25 MG tablet Take 1 tablet (25 mg total) by mouth at bedtime. 90 tablet 3   torsemide  (DEMADEX ) 20 MG tablet Take 3 tablets (60 mg total) by mouth daily. 90 tablet 5   triamcinolone  cream (KENALOG ) 0.1 % Apply 1 Application topically 2 (two) times daily. 30 g 0   ZTLIDO  1.8 % PTCH Apply 1 patch topically as needed (back pain).     allopurinol  (ZYLOPRIM ) 100 MG tablet Take 1.5 tablets (150 mg total) by mouth daily. 90 tablet 3   naloxone  (NARCAN ) nasal spray 4 mg/0.1 mL Place 1 spray into the nose as needed. (Patient not taking: Reported on 07/21/2024)     No current facility-administered medications for this encounter.   No Known Allergies  Social History   Socioeconomic History   Marital status: Married    Spouse name: Not on file   Number of children: 3   Years of education: Not on file   Highest education level: Not on file  Occupational History   Not on file  Tobacco Use   Smoking status: Former    Current packs/day: 0.00    Average packs/day: 0.3 packs/day     Types: Cigarettes    Quit date: 04/24/2020    Years since quitting: 4.2   Smokeless tobacco: Never   Tobacco comments:    4-5 cigarettes a day  Vaping Use   Vaping status: Never Used  Substance and Sexual Activity   Alcohol use: No   Drug use: No   Sexual activity: Not on file  Other Topics Concern   Not on file  Social History Narrative   Not on file   Social Drivers of Health   Tobacco Use: Medium Risk (07/22/2024)  Patient History    Smoking Tobacco Use: Former    Smokeless Tobacco Use: Never    Passive Exposure: Not on file  Financial Resource Strain: Medium Risk (03/01/2024)   Received from [facility]   Overall Financial Resource Strain (CARDIA)    How hard is it for you to pay for the very basics like food, housing, medical care, and heating?: Somewhat hard  Food Insecurity: No Food Insecurity (03/01/2024)   Received from [facility]   Epic    Within the past 12 months, you worried that your food would run out before you got the money to buy more.: Never true    Within the past 12 months, the food you bought just didn't last and you didn't have money to get more.: Never true  Transportation Needs: No Transportation Needs (03/01/2024)   Received from [facility]    In the past 12 months, has lack of transportation kept you from medical appointments or from getting medications?: No    In the past 12 months, has lack of transportation kept you from meetings, work, or from getting things needed for daily living?: No  Physical Activity: Not on file  Stress: Not on file  Social Connections: Not on file  Intimate Partner Violence: Not on file  Depression (PHQ2-9): Low Risk (07/21/2024)   Depression (PHQ2-9)    PHQ-2 Score: 0  Alcohol Screen: Not on file  Housing: Low Risk (03/01/2024)   Received from [facility]    In the last 12 months, was there a time when you were not able to pay the mortgage or rent on time?: No    In the past 12 months, how many  times have you moved where you were living?: 0    At any time in the past 12 months, were you homeless or living in a shelter (including now)?: No  Utilities: Not At Risk (03/01/2024)   Received from [facility]    In the past 12 months has the electric, gas, oil, or water company threatened to shut off services in your home?: No  Health Literacy: Not on file   Family History  Problem Relation Age of Onset   Diabetes Mother    Hypertension Father    Heart disease Other    Diabetes Other    Hypertension Other    Alcohol abuse Other    Hypertension Brother    Asthma Brother    Hypertension Brother    Asthma Brother    COPD Maternal Grandmother    Lung cancer Maternal Grandmother    Asthma Paternal Grandmother    Asthma Paternal Aunt    Wt Readings from Last 3 Encounters:  07/22/24 (!) 185.3 kg (408 lb 9.6 oz)  07/21/24 (!) 184.6 kg (407 lb)  05/11/24 (!) 184.3 kg (406 lb 4 oz)   BP 129/82   Pulse 97   Ht 5' 3 (1.6 m)   Wt (!) 185.3 kg (408 lb 9.6 oz)   LMP 02/13/2014   SpO2 92%   BMI 72.38 kg/m   PHYSICAL EXAM: General:  Chronically ill appearing. Arrived in wheelchair. Neck: JVP difficult Cor: Regular rate & rhythm. No murmurs. Lungs: diminished Abdomen: obese, + edema Extremities: 2+ edema to thighs Neuro: alert & orientedx3. Affect pleasant   ASSESSMENT & PLAN: 1. Chronic HFpEF  - Echo (4/22): EF 60-65%, RV poorly visualized. - Echo (10/23): EF 60-65% RV ok  - NYHA III, confounded by  super morbid obesity and deconditioning. Volume up on exam. Likely has component of lymphedema contributing to leg swelling. Given Rx for compression stockings - continue metolazone  2.5 mg on Monday and add dose on Thursdays + 40 KCL w/ metolazone  - Increase torsemide  to 80 BID + KCL 20 mEq daily - continue spironolactone  25 mg daily - continue Entresto  49/51 bid.  - No SGLT2i due hygeine  - BMET/proBNP today, repeat BMET 1 week - Repeat echo - Unfortunately insurance  will not cover cardiomems - Needs significant weight loss. She is actively working on this  2. HTN - BP stable - Meds as above   3. Morbid Obesity - Body mass index is 72.38 kg/m. - on Ozempic , lost over 70 lb so far. Feels she had more success with Wegovy  and is planning to switch back to this.   4. DM II - Followed by PCP. - On insulin . - Continue Ozempic    5. CKD IIIb - Baseline Scr about 1.4 - Watch with increasing diuretics  Follow up 1 week for labs, 2 weeks with APP to assess volume  Lashane Whelpley N, PA-C 3:39 PM  "

## 2024-07-22 ENCOUNTER — Encounter (HOSPITAL_COMMUNITY): Payer: Self-pay

## 2024-07-22 ENCOUNTER — Ambulatory Visit (HOSPITAL_COMMUNITY): Payer: Self-pay | Admitting: Physician Assistant

## 2024-07-22 ENCOUNTER — Other Ambulatory Visit (HOSPITAL_COMMUNITY): Payer: Self-pay

## 2024-07-22 ENCOUNTER — Ambulatory Visit (HOSPITAL_COMMUNITY)
Admission: RE | Admit: 2024-07-22 | Discharge: 2024-07-22 | Disposition: A | Discharge status: Home / Self Care | Payer: MEDICAID | Source: Ambulatory Visit | Attending: Physician Assistant | Admitting: Physician Assistant

## 2024-07-22 ENCOUNTER — Other Ambulatory Visit: Payer: Self-pay | Admitting: Nurse Practitioner

## 2024-07-22 ENCOUNTER — Ambulatory Visit: Payer: Self-pay | Admitting: Nurse Practitioner

## 2024-07-22 VITALS — BP 129/82 | HR 97 | Ht 63.0 in | Wt >= 6400 oz

## 2024-07-22 DIAGNOSIS — J45909 Unspecified asthma, uncomplicated: Secondary | ICD-10-CM | POA: Diagnosis not present

## 2024-07-22 DIAGNOSIS — E8770 Fluid overload, unspecified: Secondary | ICD-10-CM | POA: Insufficient documentation

## 2024-07-22 DIAGNOSIS — I130 Hypertensive heart and chronic kidney disease with heart failure and stage 1 through stage 4 chronic kidney disease, or unspecified chronic kidney disease: Secondary | ICD-10-CM | POA: Insufficient documentation

## 2024-07-22 DIAGNOSIS — Z7985 Long-term (current) use of injectable non-insulin antidiabetic drugs: Secondary | ICD-10-CM | POA: Insufficient documentation

## 2024-07-22 DIAGNOSIS — Z794 Long term (current) use of insulin: Secondary | ICD-10-CM | POA: Diagnosis not present

## 2024-07-22 DIAGNOSIS — E1122 Type 2 diabetes mellitus with diabetic chronic kidney disease: Secondary | ICD-10-CM | POA: Insufficient documentation

## 2024-07-22 DIAGNOSIS — E785 Hyperlipidemia, unspecified: Secondary | ICD-10-CM | POA: Diagnosis not present

## 2024-07-22 DIAGNOSIS — Z6841 Body Mass Index (BMI) 40.0 and over, adult: Secondary | ICD-10-CM | POA: Diagnosis not present

## 2024-07-22 DIAGNOSIS — Z87891 Personal history of nicotine dependence: Secondary | ICD-10-CM | POA: Diagnosis not present

## 2024-07-22 DIAGNOSIS — N1832 Chronic kidney disease, stage 3b: Secondary | ICD-10-CM | POA: Insufficient documentation

## 2024-07-22 DIAGNOSIS — I5032 Chronic diastolic (congestive) heart failure: Secondary | ICD-10-CM | POA: Diagnosis present

## 2024-07-22 DIAGNOSIS — I10 Essential (primary) hypertension: Secondary | ICD-10-CM | POA: Diagnosis not present

## 2024-07-22 DIAGNOSIS — Z79899 Other long term (current) drug therapy: Secondary | ICD-10-CM | POA: Insufficient documentation

## 2024-07-22 DIAGNOSIS — J9611 Chronic respiratory failure with hypoxia: Secondary | ICD-10-CM | POA: Insufficient documentation

## 2024-07-22 DIAGNOSIS — M109 Gout, unspecified: Secondary | ICD-10-CM

## 2024-07-22 LAB — BASIC METABOLIC PANEL WITH GFR
Anion gap: 12 (ref 5–15)
BUN: 25 mg/dL — ABNORMAL HIGH (ref 6–20)
CO2: 33 mmol/L — ABNORMAL HIGH (ref 22–32)
Calcium: 9.1 mg/dL (ref 8.9–10.3)
Chloride: 93 mmol/L — ABNORMAL LOW (ref 98–111)
Creatinine, Ser: 1.49 mg/dL — ABNORMAL HIGH (ref 0.44–1.00)
GFR, Estimated: 41 mL/min — ABNORMAL LOW
Glucose, Bld: 102 mg/dL — ABNORMAL HIGH (ref 70–99)
Potassium: 3 mmol/L — ABNORMAL LOW (ref 3.5–5.1)
Sodium: 137 mmol/L (ref 135–145)

## 2024-07-22 LAB — URIC ACID: Uric Acid: 10.5 mg/dL — ABNORMAL HIGH (ref 3.0–7.2)

## 2024-07-22 LAB — PRO BRAIN NATRIURETIC PEPTIDE: Pro Brain Natriuretic Peptide: <50 pg/mL

## 2024-07-22 MED ORDER — ALLOPURINOL 100 MG PO TABS: 150.0000 mg | ORAL_TABLET | Freq: Every day | ORAL | 3 refills | Status: AC

## 2024-07-22 MED ORDER — METOLAZONE 2.5 MG PO TABS: 2.5000 mg | ORAL_TABLET | ORAL | 11 refills | Status: AC

## 2024-07-22 MED ORDER — TORSEMIDE 20 MG PO TABS: 80.0000 mg | ORAL_TABLET | Freq: Two times a day (BID) | ORAL | 11 refills | Status: AC

## 2024-07-22 MED ORDER — POTASSIUM CHLORIDE CRYS ER 20 MEQ PO TBCR: 20.0000 meq | EXTENDED_RELEASE_TABLET | Freq: Every day | ORAL | 11 refills | Status: AC

## 2024-07-22 NOTE — Patient Instructions (Addendum)
 Thank you for coming in today  If you had labs drawn today, any labs that are abnormal the clinic will call you No news is good news  Medications: Torsemide  80 mg 1 tablet twice daily Take Metolazone  2.5 mg twice weekly Monday and Thursday  Follow up appointments: Your physician recommends that you return for lab work in: 1 week BMET  Your physician recommends that you schedule a follow-up appointment in:  3 weeks in clinic   Do the following things EVERYDAY: Weigh yourself in the morning before breakfast. Write it down and keep it in a log. Take your medicines as prescribed Eat low salt foods--Limit salt (sodium) to 2000 mg per day.  Stay as active as you can everyday Limit all fluids for the day to less than 2 liters   At the [facility], you and your health needs are our priority. As part of our continuing mission to provide you with exceptional heart care, we have created designated Provider Care Teams. These Care Teams include your primary Cardiologist (physician) and Advanced Practice Providers (APPs- Physician Assistants and Nurse Practitioners) who all work together to provide you with the care you need, when you need it.   You may see any of the following providers on your designated Care Team at your next follow up: Dr Toribio Fuel Dr Ezra Shuck Dr. Ria Gardenia Greig Lenetta, NP Caffie Shed, [place] [facility] [place], [place] Beckey Coe, NP Tinnie Redman, PharmD   Please be sure to bring in all your medications bottles to every appointment.    Thank you for choosing [facility]  If you have any questions or concerns before your next appointment please send us  a message through [place] or call our office at [phone].    TO LEAVE A MESSAGE FOR THE NURSE SELECT OPTION 2, PLEASE LEAVE A MESSAGE INCLUDING: YOUR NAME DATE OF BIRTH CALL BACK NUMBER REASON FOR CALL**this is important as we  prioritize the call backs  YOU WILL RECEIVE A CALL BACK THE SAME DAY AS LONG AS YOU CALL BEFORE 4:00 PM

## 2024-07-28 ENCOUNTER — Ambulatory Visit: Payer: MEDICAID | Admitting: Podiatry

## 2024-07-28 DIAGNOSIS — L97511 Non-pressure chronic ulcer of other part of right foot limited to breakdown of skin: Secondary | ICD-10-CM

## 2024-07-28 MED ORDER — SANTYL 250 UNIT/GM EX OINT: 1.0000 | TOPICAL_OINTMENT | Freq: Every day | CUTANEOUS | 0 refills | Status: AC

## 2024-07-28 NOTE — Progress Notes (Unsigned)
 Healing slowly transition to satnly 0.5 x 0.3  x 0.3

## 2024-07-29 ENCOUNTER — Ambulatory Visit (HOSPITAL_COMMUNITY): Payer: MEDICAID

## 2024-08-05 ENCOUNTER — Ambulatory Visit (HOSPITAL_COMMUNITY): Payer: MEDICAID

## 2024-08-06 ENCOUNTER — Ambulatory Visit (HOSPITAL_COMMUNITY): Payer: MEDICAID

## 2024-08-12 ENCOUNTER — Ambulatory Visit: Payer: MEDICAID | Admitting: Sports Medicine

## 2024-08-13 ENCOUNTER — Ambulatory Visit (HOSPITAL_COMMUNITY): Payer: MEDICAID

## 2024-08-19 ENCOUNTER — Ambulatory Visit: Payer: MEDICAID | Admitting: Podiatry

## 2024-09-01 ENCOUNTER — Ambulatory Visit: Payer: MEDICAID | Admitting: Podiatry

## 2024-09-01 ENCOUNTER — Other Ambulatory Visit: Payer: Self-pay

## 2024-10-20 ENCOUNTER — Ambulatory Visit: Payer: Self-pay | Admitting: Nurse Practitioner
